# Patient Record
Sex: Female | Born: 1940
Health system: Southern US, Community
[De-identification: ages and names within clinical notes are randomized; demographics above are authoritative.]

## PROBLEM LIST (undated history)

## (undated) DIAGNOSIS — Z9989 Dependence on other enabling machines and devices: Secondary | ICD-10-CM

## (undated) DIAGNOSIS — J45909 Unspecified asthma, uncomplicated: Secondary | ICD-10-CM

## (undated) DIAGNOSIS — Z9289 Personal history of other medical treatment: Secondary | ICD-10-CM

## (undated) DIAGNOSIS — I10 Essential (primary) hypertension: Secondary | ICD-10-CM

## (undated) DIAGNOSIS — I2510 Atherosclerotic heart disease of native coronary artery without angina pectoris: Secondary | ICD-10-CM

## (undated) DIAGNOSIS — U071 COVID-19: Secondary | ICD-10-CM

## (undated) DIAGNOSIS — K219 Gastro-esophageal reflux disease without esophagitis: Secondary | ICD-10-CM

## (undated) DIAGNOSIS — I7300 Raynaud's syndrome without gangrene: Secondary | ICD-10-CM

## (undated) DIAGNOSIS — IMO0002 Reserved for concepts with insufficient information to code with codable children: Secondary | ICD-10-CM

## (undated) DIAGNOSIS — D649 Anemia, unspecified: Secondary | ICD-10-CM

## (undated) DIAGNOSIS — M341 CR(E)ST syndrome: Secondary | ICD-10-CM

## (undated) DIAGNOSIS — G4733 Obstructive sleep apnea (adult) (pediatric): Secondary | ICD-10-CM

## (undated) DIAGNOSIS — I951 Orthostatic hypotension: Secondary | ICD-10-CM

## (undated) DIAGNOSIS — N816 Rectocele: Secondary | ICD-10-CM

## (undated) DIAGNOSIS — I255 Ischemic cardiomyopathy: Secondary | ICD-10-CM

## (undated) DIAGNOSIS — K559 Vascular disorder of intestine, unspecified: Secondary | ICD-10-CM

## (undated) DIAGNOSIS — E785 Hyperlipidemia, unspecified: Secondary | ICD-10-CM

## (undated) DIAGNOSIS — M1990 Unspecified osteoarthritis, unspecified site: Secondary | ICD-10-CM

## (undated) DIAGNOSIS — K2270 Barrett's esophagus without dysplasia: Secondary | ICD-10-CM

## (undated) DIAGNOSIS — M810 Age-related osteoporosis without current pathological fracture: Secondary | ICD-10-CM

## (undated) DIAGNOSIS — K5792 Diverticulitis of intestine, part unspecified, without perforation or abscess without bleeding: Secondary | ICD-10-CM

## (undated) HISTORY — DX: Orthostatic hypotension: I95.1

## (undated) HISTORY — DX: Diverticulitis of intestine, part unspecified, without perforation or abscess without bleeding: K57.92

## (undated) HISTORY — DX: Personal history of other medical treatment: Z92.89

## (undated) HISTORY — DX: Age-related osteoporosis without current pathological fracture: M81.0

## (undated) HISTORY — DX: Unspecified asthma, uncomplicated: J45.909

## (undated) HISTORY — DX: Anemia, unspecified: D64.9

## (undated) HISTORY — DX: Reserved for concepts with insufficient information to code with codable children: IMO0002

## (undated) HISTORY — DX: Essential (primary) hypertension: I10

## (undated) HISTORY — DX: Vascular disorder of intestine, unspecified: K55.9

## (undated) HISTORY — DX: COVID-19: U07.1

## (undated) HISTORY — DX: Ischemic cardiomyopathy: I25.5

## (undated) HISTORY — DX: Gastro-esophageal reflux disease without esophagitis: K21.9

## (undated) HISTORY — DX: Rectocele: N81.6

## (undated) HISTORY — DX: Barrett's esophagus without dysplasia: K22.70

## (undated) HISTORY — PX: TOTAL KNEE ARTHROPLASTY: SHX125

## (undated) HISTORY — PX: ROTATOR CUFF REPAIR: SHX139

---
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
## 1978-11-25 HISTORY — PX: TUBAL LIGATION: SHX77

## 1994-11-25 HISTORY — PX: ABDOMINAL HYSTERECTOMY: SHX81

## 2001-01-15 ENCOUNTER — Encounter: Payer: Self-pay | Admitting: Emergency Medicine

## 2001-01-15 ENCOUNTER — Emergency Department (HOSPITAL_COMMUNITY): Admission: EM | Admit: 2001-01-15 | Discharge: 2001-01-15 | Payer: Self-pay | Admitting: Emergency Medicine

## 2004-11-25 HISTORY — PX: OTHER SURGICAL HISTORY: SHX169

## 2006-08-08 ENCOUNTER — Other Ambulatory Visit: Admission: RE | Admit: 2006-08-08 | Discharge: 2006-08-08 | Payer: Self-pay | Admitting: Gynecology

## 2007-09-03 ENCOUNTER — Emergency Department (HOSPITAL_COMMUNITY): Admission: EM | Admit: 2007-09-03 | Discharge: 2007-09-04 | Payer: Self-pay | Admitting: Emergency Medicine

## 2008-08-01 ENCOUNTER — Emergency Department (HOSPITAL_BASED_OUTPATIENT_CLINIC_OR_DEPARTMENT_OTHER): Admission: EM | Admit: 2008-08-01 | Discharge: 2008-08-01 | Payer: Self-pay | Admitting: Emergency Medicine

## 2008-12-23 ENCOUNTER — Encounter: Admission: RE | Admit: 2008-12-23 | Discharge: 2008-12-23 | Payer: Self-pay | Admitting: Obstetrics & Gynecology

## 2009-04-17 ENCOUNTER — Encounter: Admission: RE | Admit: 2009-04-17 | Discharge: 2009-04-17 | Payer: Self-pay | Admitting: Family Medicine

## 2009-07-26 HISTORY — PX: JOINT REPLACEMENT: SHX530

## 2009-08-11 ENCOUNTER — Inpatient Hospital Stay (HOSPITAL_COMMUNITY): Admission: RE | Admit: 2009-08-11 | Discharge: 2009-08-14 | Payer: Self-pay | Admitting: Orthopedic Surgery

## 2009-10-26 ENCOUNTER — Ambulatory Visit: Payer: Self-pay | Admitting: Diagnostic Radiology

## 2009-10-26 ENCOUNTER — Emergency Department (HOSPITAL_BASED_OUTPATIENT_CLINIC_OR_DEPARTMENT_OTHER): Admission: EM | Admit: 2009-10-26 | Discharge: 2009-10-26 | Payer: Self-pay | Admitting: Emergency Medicine

## 2010-09-06 ENCOUNTER — Other Ambulatory Visit: Admission: RE | Admit: 2010-09-06 | Discharge: 2010-09-06 | Payer: Self-pay | Admitting: Gynecology

## 2010-09-06 ENCOUNTER — Ambulatory Visit: Payer: Self-pay | Admitting: Gynecology

## 2011-01-09 ENCOUNTER — Other Ambulatory Visit: Payer: Self-pay | Admitting: Family Medicine

## 2011-01-09 DIAGNOSIS — R42 Dizziness and giddiness: Secondary | ICD-10-CM

## 2011-01-11 ENCOUNTER — Ambulatory Visit
Admission: RE | Admit: 2011-01-11 | Discharge: 2011-01-11 | Disposition: A | Discharge status: Home / Self Care | Payer: PRIVATE HEALTH INSURANCE | Source: Ambulatory Visit | Attending: Family Medicine | Admitting: Family Medicine

## 2011-01-11 DIAGNOSIS — R42 Dizziness and giddiness: Secondary | ICD-10-CM

## 2011-02-26 LAB — CBC
HCT: 33.9 % — ABNORMAL LOW (ref 36.0–46.0)
MCHC: 33.1 g/dL (ref 30.0–36.0)
MCV: 80.4 fL (ref 78.0–100.0)
Platelets: 192 10*3/uL (ref 150–400)
RDW: 17.8 % — ABNORMAL HIGH (ref 11.5–15.5)
WBC: 7.1 10*3/uL (ref 4.0–10.5)

## 2011-02-26 LAB — DIFFERENTIAL
Basophils Relative: 1 % (ref 0–1)
Eosinophils Absolute: 0.1 10*3/uL (ref 0.0–0.7)
Eosinophils Relative: 1 % (ref 0–5)
Lymphs Abs: 1.1 10*3/uL (ref 0.7–4.0)
Neutrophils Relative %: 74 % (ref 43–77)

## 2011-02-26 LAB — BASIC METABOLIC PANEL
BUN: 13 mg/dL (ref 6–23)
CO2: 29 mEq/L (ref 19–32)
Chloride: 102 mEq/L (ref 96–112)
Creatinine, Ser: 0.8 mg/dL (ref 0.4–1.2)
Glucose, Bld: 119 mg/dL — ABNORMAL HIGH (ref 70–99)
Potassium: 4.5 mEq/L (ref 3.5–5.1)

## 2011-02-26 LAB — HEMOCCULT GUIAC POC 1CARD (OFFICE): Fecal Occult Bld: NEGATIVE

## 2011-03-01 LAB — PROTIME-INR
INR: 1 (ref 0.00–1.49)
INR: 1.2 (ref 0.00–1.49)
INR: 1.6 — ABNORMAL HIGH (ref 0.00–1.49)
Prothrombin Time: 12.9 seconds (ref 11.6–15.2)
Prothrombin Time: 19.2 seconds — ABNORMAL HIGH (ref 11.6–15.2)
Prothrombin Time: 11.8 seconds (ref 11.6–15.2)

## 2011-03-01 LAB — URINALYSIS, ROUTINE W REFLEX MICROSCOPIC
Glucose, UA: NEGATIVE mg/dL
Protein, ur: NEGATIVE mg/dL
Urobilinogen, UA: 0.2 mg/dL (ref 0.0–1.0)

## 2011-03-01 LAB — CBC
MCHC: 33.9 g/dL (ref 30.0–36.0)
MCHC: 34 g/dL (ref 30.0–36.0)
MCV: 87.9 fL (ref 78.0–100.0)
Platelets: 162 10*3/uL (ref 150–400)
Platelets: 166 10*3/uL (ref 150–400)
Platelets: 200 10*3/uL (ref 150–400)
RBC: 4.57 MIL/uL (ref 3.87–5.11)
RDW: 14.1 % (ref 11.5–15.5)
WBC: 10.2 10*3/uL (ref 4.0–10.5)
WBC: 10.9 10*3/uL — ABNORMAL HIGH (ref 4.0–10.5)
WBC: 11 10*3/uL — ABNORMAL HIGH (ref 4.0–10.5)
WBC: 7.1 10*3/uL (ref 4.0–10.5)

## 2011-03-01 LAB — TYPE AND SCREEN: Antibody Screen: NEGATIVE

## 2011-03-01 LAB — BASIC METABOLIC PANEL
BUN: 3 mg/dL — ABNORMAL LOW (ref 6–23)
BUN: 7 mg/dL (ref 6–23)
Calcium: 8.6 mg/dL (ref 8.4–10.5)
Calcium: 8.9 mg/dL (ref 8.4–10.5)
Chloride: 101 mEq/L (ref 96–112)
Creatinine, Ser: 0.71 mg/dL (ref 0.4–1.2)
GFR calc Af Amer: >60 mL/min (ref >60)
GFR calc non Af Amer: >60 mL/min (ref >60)
Glucose, Bld: 131 mg/dL — ABNORMAL HIGH (ref 70–99)
Sodium: 137 mEq/L (ref 135–145)

## 2011-03-01 LAB — DIFFERENTIAL
Eosinophils Absolute: 0.2 10*3/uL (ref 0.0–0.7)
Eosinophils Relative: 3 % (ref 0–5)
Lymphs Abs: 1.8 10*3/uL (ref 0.7–4.0)

## 2011-03-01 LAB — COMPREHENSIVE METABOLIC PANEL
ALT: 21 U/L (ref 0–35)
AST: 19 U/L (ref 0–37)
CO2: 25 mEq/L (ref 19–32)
Calcium: 9.6 mg/dL (ref 8.4–10.5)
Chloride: 102 mEq/L (ref 96–112)
GFR calc Af Amer: >60 mL/min (ref >60)
GFR calc non Af Amer: >60 mL/min (ref >60)
Potassium: 4.7 mEq/L (ref 3.5–5.1)
Sodium: 136 mEq/L (ref 135–145)

## 2011-03-01 LAB — URINE MICROSCOPIC-ADD ON

## 2011-08-28 LAB — GLUCOSE, CAPILLARY: Glucose-Capillary: 102 — ABNORMAL HIGH

## 2011-08-28 LAB — COMPREHENSIVE METABOLIC PANEL
ALT: 46 — ABNORMAL HIGH
AST: 31
CO2: 25
Calcium: 9.8
Chloride: 109
GFR calc Af Amer: >60
GFR calc non Af Amer: >60
Sodium: 140

## 2011-08-28 LAB — CBC
RBC: 4.6
WBC: 6.1

## 2011-08-28 LAB — DIFFERENTIAL
Eosinophils Absolute: 0.2
Eosinophils Relative: 3
Lymphs Abs: 1.5

## 2011-09-03 DIAGNOSIS — N816 Rectocele: Secondary | ICD-10-CM | POA: Insufficient documentation

## 2011-09-03 DIAGNOSIS — K219 Gastro-esophageal reflux disease without esophagitis: Secondary | ICD-10-CM | POA: Insufficient documentation

## 2011-09-03 DIAGNOSIS — IMO0002 Reserved for concepts with insufficient information to code with codable children: Secondary | ICD-10-CM | POA: Insufficient documentation

## 2011-09-03 DIAGNOSIS — K2270 Barrett's esophagus without dysplasia: Secondary | ICD-10-CM | POA: Insufficient documentation

## 2011-09-05 LAB — CBC
MCHC: 33.6
MCV: 82.7
Platelets: 169
WBC: 10.4

## 2011-09-05 LAB — DIFFERENTIAL
Basophils Absolute: 0.1
Basophils Relative: 1
Eosinophils Absolute: 0.2
Lymphs Abs: 2
Neutrophils Relative %: 67

## 2011-09-05 LAB — D-DIMER, QUANTITATIVE: D-Dimer, Quant: 0.41

## 2011-09-05 LAB — POCT CARDIAC MARKERS: Operator id: 1192

## 2011-09-09 ENCOUNTER — Other Ambulatory Visit (HOSPITAL_COMMUNITY)
Admission: RE | Admit: 2011-09-09 | Discharge: 2011-09-09 | Disposition: A | Discharge status: Home / Self Care | Payer: PRIVATE HEALTH INSURANCE | Source: Ambulatory Visit | Attending: Gynecology | Admitting: Gynecology

## 2011-09-09 ENCOUNTER — Encounter: Payer: Self-pay | Admitting: Gynecology

## 2011-09-09 ENCOUNTER — Ambulatory Visit (INDEPENDENT_AMBULATORY_CARE_PROVIDER_SITE_OTHER): Payer: PRIVATE HEALTH INSURANCE | Admitting: Gynecology

## 2011-09-09 VITALS — BP 128/74 | Ht 61.0 in | Wt 137.0 lb

## 2011-09-09 DIAGNOSIS — R82998 Other abnormal findings in urine: Secondary | ICD-10-CM

## 2011-09-09 DIAGNOSIS — Z01419 Encounter for gynecological examination (general) (routine) without abnormal findings: Secondary | ICD-10-CM

## 2011-09-09 DIAGNOSIS — B373 Candidiasis of vulva and vagina: Secondary | ICD-10-CM

## 2011-09-09 DIAGNOSIS — K469 Unspecified abdominal hernia without obstruction or gangrene: Secondary | ICD-10-CM

## 2011-09-09 DIAGNOSIS — B3731 Acute candidiasis of vulva and vagina: Secondary | ICD-10-CM

## 2011-09-09 DIAGNOSIS — K5792 Diverticulitis of intestine, part unspecified, without perforation or abscess without bleeding: Secondary | ICD-10-CM | POA: Insufficient documentation

## 2011-09-09 DIAGNOSIS — N952 Postmenopausal atrophic vaginitis: Secondary | ICD-10-CM

## 2011-09-09 DIAGNOSIS — N8111 Cystocele, midline: Secondary | ICD-10-CM

## 2011-09-09 MED ORDER — FLUCONAZOLE 150 MG PO TABS: 150.0000 mg | ORAL_TABLET | Freq: Once | ORAL | Status: AC

## 2011-09-09 NOTE — Progress Notes (Signed)
Addended by: Landis Martins R on: 09/09/2011 09:43 AM   Modules accepted: Orders

## 2011-09-09 NOTE — Progress Notes (Signed)
Kristine [name] 06-13-1941 [id number]        70 y.o.  for follow up. History of cystocele enterocele and occasional spotting which we think is due to irritative changes. She's not having any symptoms from a bladder standpoint or from her prolapse.  Past medical history,surgical history, medications, allergies, family history and social history were all reviewed and documented in the EPIC chart. ROS:  Was performed and pertinent positives and negatives are included in the history.  Exam: chaperone present Filed Vitals:   09/09/11 0905  BP: 128/74   General appearance  Normal Skin grossly normal Head/Neck normal with no cervical or supraclavicular adenopathy thyroid normal Lungs  clear Cardiac RR, without RMG Abdominal  soft, nontender, without masses, organomegaly or hernia Breasts  examined lying and sitting without masses, retractions, discharge or axillary adenopathy. Pelvic  Ext/BUS/vagina  normal with atrophic changes mild cystocele with second degree enterocele no significant rectocele, slight discharge wet prep done  Adnexa  Without masses or tenderness    Anus and perineum  normal   Rectovaginal  normal sphincter tone without palpated masses or tenderness.    Assessment/Plan:  70 y.o. female  1. Cystocele enterocele. Patient is asymptomatic with occasional spotting. No bleeding today with atrophic changes noted. Wet prep is positive for yeast which I think contributes to her spotting will treat her with Diflucan 150x1 dose. Options for management to include pessary, surgery, vaginal estrogen such as Vagifem was reviewed she's not interested in doing this as she is asymptomatic and would prefer just to monitor. 2. Health maintenance. Self breast exams on a monthly basis discussed urged. She is due for mammography in December and knows to schedule this. Is due for bone density and we will schedule that now, she agrees to do so. She's up-to-date with colonoscopy. No blood work was done as  this is all done through her primary's office.  Assuming she continues well she will see me in a year, sooner as needed    Dara Lords MD, 9:27 AM 09/09/2011

## 2011-11-14 ENCOUNTER — Encounter (HOSPITAL_BASED_OUTPATIENT_CLINIC_OR_DEPARTMENT_OTHER): Payer: Self-pay | Admitting: *Deleted

## 2011-11-14 NOTE — Progress Notes (Signed)
To come in for ekg and bmet Arrive with daughter [employer]

## 2011-11-20 ENCOUNTER — Other Ambulatory Visit: Payer: Self-pay

## 2011-11-20 ENCOUNTER — Encounter (HOSPITAL_BASED_OUTPATIENT_CLINIC_OR_DEPARTMENT_OTHER)
Admission: RE | Admit: 2011-11-20 | Discharge: 2011-11-20 | Disposition: A | Discharge status: Home / Self Care | Payer: PRIVATE HEALTH INSURANCE | Source: Ambulatory Visit | Attending: Orthopedic Surgery | Admitting: Orthopedic Surgery

## 2011-11-20 LAB — BASIC METABOLIC PANEL
BUN: 17 mg/dL (ref 6–23)
CO2: 27 mEq/L (ref 19–32)
Calcium: 9.4 mg/dL (ref 8.4–10.5)
Creatinine, Ser: 0.78 mg/dL (ref 0.50–1.10)
Glucose, Bld: 96 mg/dL (ref 70–99)

## 2011-11-21 ENCOUNTER — Encounter (HOSPITAL_BASED_OUTPATIENT_CLINIC_OR_DEPARTMENT_OTHER): Payer: Self-pay | Admitting: *Deleted

## 2011-11-21 ENCOUNTER — Encounter (HOSPITAL_BASED_OUTPATIENT_CLINIC_OR_DEPARTMENT_OTHER): Payer: Self-pay | Admitting: Anesthesiology

## 2011-11-21 ENCOUNTER — Ambulatory Visit (HOSPITAL_BASED_OUTPATIENT_CLINIC_OR_DEPARTMENT_OTHER): Payer: PRIVATE HEALTH INSURANCE | Admitting: Anesthesiology

## 2011-11-21 ENCOUNTER — Ambulatory Visit (HOSPITAL_BASED_OUTPATIENT_CLINIC_OR_DEPARTMENT_OTHER)
Admission: RE | Admit: 2011-11-21 | Discharge: 2011-11-21 | Disposition: A | Discharge status: Home / Self Care | Payer: PRIVATE HEALTH INSURANCE | Source: Ambulatory Visit | Attending: Orthopedic Surgery | Admitting: Orthopedic Surgery

## 2011-11-21 ENCOUNTER — Encounter (HOSPITAL_BASED_OUTPATIENT_CLINIC_OR_DEPARTMENT_OTHER)
Admission: RE | Disposition: A | Discharge status: Home / Self Care | Payer: Self-pay | Source: Ambulatory Visit | Attending: Orthopedic Surgery

## 2011-11-21 DIAGNOSIS — Z01812 Encounter for preprocedural laboratory examination: Secondary | ICD-10-CM | POA: Insufficient documentation

## 2011-11-21 DIAGNOSIS — K219 Gastro-esophageal reflux disease without esophagitis: Secondary | ICD-10-CM | POA: Insufficient documentation

## 2011-11-21 DIAGNOSIS — I10 Essential (primary) hypertension: Secondary | ICD-10-CM | POA: Insufficient documentation

## 2011-11-21 DIAGNOSIS — Z01810 Encounter for preprocedural cardiovascular examination: Secondary | ICD-10-CM | POA: Insufficient documentation

## 2011-11-21 DIAGNOSIS — M2030 Hallux varus (acquired), unspecified foot: Secondary | ICD-10-CM | POA: Insufficient documentation

## 2011-11-21 DIAGNOSIS — M2010 Hallux valgus (acquired), unspecified foot: Secondary | ICD-10-CM | POA: Insufficient documentation

## 2011-11-21 HISTORY — DX: Unspecified osteoarthritis, unspecified site: M19.90

## 2011-11-21 HISTORY — DX: Raynaud's syndrome without gangrene: I73.00

## 2011-11-21 SURGERY — FUSION, PIP JOINT
Anesthesia: General | Site: Foot | Laterality: Right | Wound class: Clean

## 2011-11-21 MED ORDER — LIDOCAINE-EPINEPHRINE 1.5-1:200000 % IJ SOLN
INTRAMUSCULAR | Status: DC | PRN
  Administered 2011-11-21: 25 mL via INTRADERMAL

## 2011-11-21 MED ORDER — LACTATED RINGERS IV SOLN
INTRAVENOUS | Status: DC
  Administered 2011-11-21 (×2): via INTRAVENOUS

## 2011-11-21 MED ORDER — ROPIVACAINE HCL 5 MG/ML IJ SOLN
INTRAMUSCULAR | Status: DC | PRN
  Administered 2011-11-21: 25 mL via EPIDURAL

## 2011-11-21 MED ORDER — PROPOFOL 10 MG/ML IV EMUL
INTRAVENOUS | Status: DC | PRN
  Administered 2011-11-21: 200 mg via INTRAVENOUS

## 2011-11-21 MED ORDER — DEXAMETHASONE SODIUM PHOSPHATE 4 MG/ML IJ SOLN
INTRAMUSCULAR | Status: DC | PRN
  Administered 2011-11-21: 10 mg via INTRAVENOUS

## 2011-11-21 MED ORDER — LIDOCAINE HCL 1 % IJ SOLN
INTRAMUSCULAR | Status: DC | PRN
  Administered 2011-11-21: 2 mL via INTRADERMAL

## 2011-11-21 MED ORDER — EPHEDRINE SULFATE 50 MG/ML IJ SOLN
INTRAMUSCULAR | Status: DC | PRN
  Administered 2011-11-21 (×2): 5 mg via INTRAVENOUS

## 2011-11-21 MED ORDER — METOCLOPRAMIDE HCL 5 MG/ML IJ SOLN: 10.0000 mg | Freq: Once | INTRAMUSCULAR | Status: DC | PRN

## 2011-11-21 MED ORDER — MORPHINE SULFATE 2 MG/ML IJ SOLN: 0.0500 mg/kg | INTRAMUSCULAR | Status: DC | PRN

## 2011-11-21 MED ORDER — FENTANYL CITRATE 0.05 MG/ML IJ SOLN
50.0000 ug | INTRAMUSCULAR | Status: DC | PRN
  Administered 2011-11-21: 100 ug via INTRAVENOUS

## 2011-11-21 MED ORDER — ONDANSETRON HCL 4 MG/2ML IJ SOLN
INTRAMUSCULAR | Status: DC | PRN
  Administered 2011-11-21: 4 mg via INTRAVENOUS

## 2011-11-21 MED ORDER — OXYCODONE HCL 5 MG PO TABS: 5.0000 mg | ORAL_TABLET | ORAL | Status: AC | PRN

## 2011-11-21 MED ORDER — MIDAZOLAM HCL 2 MG/2ML IJ SOLN
0.5000 mg | INTRAMUSCULAR | Status: DC | PRN
  Administered 2011-11-21: 2 mg via INTRAVENOUS

## 2011-11-21 MED ORDER — CEFAZOLIN SODIUM-DEXTROSE 2-3 GM-% IV SOLR
2.0000 g | INTRAVENOUS | Status: AC
  Administered 2011-11-21: 2 g via INTRAVENOUS

## 2011-11-21 MED ORDER — SODIUM CHLORIDE 0.9 % IV SOLN: INTRAVENOUS | Status: DC

## 2011-11-21 MED ORDER — LIDOCAINE HCL (CARDIAC) 20 MG/ML IV SOLN
INTRAVENOUS | Status: DC | PRN
  Administered 2011-11-21: 40 mg via INTRAVENOUS

## 2011-11-21 MED ORDER — FENTANYL CITRATE 0.05 MG/ML IJ SOLN: 25.0000 ug | INTRAMUSCULAR | Status: DC | PRN

## 2011-11-21 SURGICAL SUPPLY — 77 items
BAG DECANTER FOR FLEXI CONT (MISCELLANEOUS) ×1 IMPLANT
BANDAGE COBAN STERILE 4 (GAUZE/BANDAGES/DRESSINGS) ×1 IMPLANT
BANDAGE CONFORM 2  STR LF (GAUZE/BANDAGES/DRESSINGS) IMPLANT
BANDAGE ESMARK 6X9 LF (GAUZE/BANDAGES/DRESSINGS) IMPLANT
BANDAGE GAUZE ELAST BULKY 4 IN (GAUZE/BANDAGES/DRESSINGS) IMPLANT
BLADE AVERAGE 25X9 (BLADE) ×1 IMPLANT
BLADE OSC/SAG .038X5.5 CUT EDG (BLADE) IMPLANT
BLADE SURG 15 STRL LF DISP TIS (BLADE) ×2 IMPLANT
BLADE SURG 15 STRL SS (BLADE) ×4
BNDG CMPR 9X4 STRL LF SNTH (GAUZE/BANDAGES/DRESSINGS)
BNDG CMPR 9X6 STRL LF SNTH (GAUZE/BANDAGES/DRESSINGS)
BNDG COHESIVE 4X5 TAN STRL (GAUZE/BANDAGES/DRESSINGS) ×2 IMPLANT
BNDG COHESIVE 6X5 TAN STRL LF (GAUZE/BANDAGES/DRESSINGS) ×2 IMPLANT
BNDG ESMARK 4X9 LF (GAUZE/BANDAGES/DRESSINGS) IMPLANT
BNDG ESMARK 6X9 LF (GAUZE/BANDAGES/DRESSINGS)
CHLORAPREP W/TINT 26ML (MISCELLANEOUS) ×2 IMPLANT
CLOTH BEACON ORANGE TIMEOUT ST (SAFETY) ×2 IMPLANT
COVER MAYO STAND STRL (DRAPES) IMPLANT
COVER TABLE BACK 60X90 (DRAPES) ×2 IMPLANT
CUFF TOURNIQUET SINGLE 18IN (TOURNIQUET CUFF) IMPLANT
CUFF TOURNIQUET SINGLE 34IN LL (TOURNIQUET CUFF) IMPLANT
DECANTER SPIKE VIAL GLASS SM (MISCELLANEOUS) IMPLANT
DRAPE EXTREMITY T 121X128X90 (DRAPE) ×2 IMPLANT
DRAPE INCISE IOBAN 66X45 STRL (DRAPES) IMPLANT
DRAPE OEC MINIVIEW 54X84 (DRAPES) ×2 IMPLANT
DRAPE SURG 17X23 STRL (DRAPES) IMPLANT
DRAPE U-SHAPE 47X51 STRL (DRAPES) ×2 IMPLANT
DRAPE U-SHAPE 76X120 STRL (DRAPES) IMPLANT
DRSG EMULSION OIL 3X3 NADH (GAUZE/BANDAGES/DRESSINGS) ×2 IMPLANT
DRSG PAD ABDOMINAL 8X10 ST (GAUZE/BANDAGES/DRESSINGS) ×4 IMPLANT
DRSG TEGADERM 2-3/8X2-3/4 SM (GAUZE/BANDAGES/DRESSINGS) IMPLANT
ELECT REM PT RETURN 9FT ADLT (ELECTROSURGICAL) ×2
ELECTRODE REM PT RTRN 9FT ADLT (ELECTROSURGICAL) ×1 IMPLANT
GAUZE SPONGE 4X4 12PLY STRL LF (GAUZE/BANDAGES/DRESSINGS) ×1 IMPLANT
GLOVE BIO SURGEON STRL SZ8 (GLOVE) ×2 IMPLANT
GLOVE BIOGEL M STRL SZ7.5 (GLOVE) ×1 IMPLANT
GLOVE BIOGEL PI IND STRL 8 (GLOVE) ×1 IMPLANT
GLOVE BIOGEL PI INDICATOR 8 (GLOVE) ×1
GOWN PREVENTION PLUS XLARGE (GOWN DISPOSABLE) ×2 IMPLANT
GOWN PREVENTION PLUS XXLARGE (GOWN DISPOSABLE) ×3 IMPLANT
K WIRE, AR-8933K ×1 IMPLANT
NDL SAFETY ECLIPSE 18X1.5 (NEEDLE) IMPLANT
NEEDLE HYPO 18GX1.5 SHARP (NEEDLE)
NEEDLE HYPO 22GX1.5 SAFETY (NEEDLE) IMPLANT
PACK BASIN DAY SURGERY FS (CUSTOM PROCEDURE TRAY) ×2 IMPLANT
PAD CAST 4YDX4 CTTN HI CHSV (CAST SUPPLIES) ×1 IMPLANT
PADDING CAST ABS 4INX4YD NS (CAST SUPPLIES) ×1
PADDING CAST ABS COTTON 4X4 ST (CAST SUPPLIES) IMPLANT
PADDING CAST COTTON 4X4 STRL (CAST SUPPLIES) ×2
PADDING CAST COTTON 6X4 STRL (CAST SUPPLIES) ×2 IMPLANT
PADDING WEBRIL 4 STERILE (GAUZE/BANDAGES/DRESSINGS) ×1 IMPLANT
PASSER SUT SWANSON 36MM LOOP (INSTRUMENTS) ×1 IMPLANT
PENCIL BUTTON HOLSTER BLD 10FT (ELECTRODE) ×2 IMPLANT
SCREW LP CANN 3X30MM (Screw) ×1 IMPLANT
SHEET MEDIUM DRAPE 40X70 STRL (DRAPES) ×2 IMPLANT
SPLINT FAST PLASTER 5X30 (CAST SUPPLIES)
SPLINT PLASTER CAST FAST 5X30 (CAST SUPPLIES) IMPLANT
SPONGE LAP 18X18 X RAY DECT (DISPOSABLE) ×3 IMPLANT
STAPLER VISISTAT 35W (STAPLE) IMPLANT
STOCKINETTE 6  STRL (DRAPES) ×1
STOCKINETTE 6 STRL (DRAPES) ×1 IMPLANT
STRIP CLOSURE SKIN 1/2X4 (GAUZE/BANDAGES/DRESSINGS) IMPLANT
SUCTION FRAZIER TIP 10 FR DISP (SUCTIONS) IMPLANT
SUT ETHILON 3 0 PS 1 (SUTURE) IMPLANT
SUT MNCRL AB 3-0 PS2 18 (SUTURE) ×2 IMPLANT
SUT PROLENE 3 0 PS 2 (SUTURE) ×3 IMPLANT
SUT VIC AB 0 SH 27 (SUTURE) ×2 IMPLANT
SUT VIC AB 2-0 SH 18 (SUTURE) IMPLANT
SUT VIC AB 2-0 SH 27 (SUTURE) ×2
SUT VIC AB 2-0 SH 27XBRD (SUTURE) IMPLANT
SUT VICRYL 4-0 PS2 18IN ABS (SUTURE) IMPLANT
SYR BULB 3OZ (MISCELLANEOUS) ×2 IMPLANT
TIGHTROPE REPAIR MINI (Orthopedic Implant) ×2 IMPLANT
TIGHTROPE REPAIR MINI .45 (Orthopedic Implant) IMPLANT
TUBE CONNECTING 20X1/4 (TUBING) IMPLANT
UNDERPAD 30X30 INCONTINENT (UNDERPADS AND DIAPERS) ×2 IMPLANT
WATER STERILE IRR 1000ML POUR (IV SOLUTION) ×1 IMPLANT

## 2011-11-21 NOTE — H&P (Signed)
Kristine [name] is an 70 y.o. female.   Chief Complaint: right hallux deformity HPI: 70 y/o female with claw hallux and hallux varus on right foot.  She desires correction after failing treatment with shoewear and activity modification.  Past Medical History  Diagnosis Date  . GERD (gastroesophageal reflux disease)   . Barrett's esophagus     Stage B  . Cystocele   . Rectocele     mild  . Hypertension   . Diverticulitis   . Arthritis   . Raynaud disease     Past Surgical History  Procedure Date  . Tonsillectomy 1950  . Tubal ligation 1980  . Nissen fundoplication 2006  . Abdominal hysterectomy 1996    RSO  . Total knee arthroplasty     right  . Joint replacement 9/10    rt total    Family History  Problem Relation Age of Onset  . Cancer Mother     Pancreatic Cancer  . Heart disease Father    Social History:  reports that she has never smoked. She has never used smokeless tobacco. She reports that she does not drink alcohol or use illicit drugs.  Allergies:  Allergies  Allergen Reactions  . Nsaids     GERD hx    Medications Prior to Admission  Medication Dose Route Frequency Provider Last Rate Last Dose  . 0.9 %  sodium chloride infusion   Intravenous Continuous Toni Arthurs, MD      . ceFAZolin (ANCEF) IVPB 2 g/50 mL premix  2 g Intravenous 60 min Pre-Op Toni Arthurs, MD      . fentaNYL (SUBLIMAZE) injection 50-100 mcg  50-100 mcg Intravenous PRN Constance Goltz, MD   100 mcg at 11/21/11 0654  . lactated ringers infusion   Intravenous Continuous Raiford Simmonds, MD 10 mL/hr at 11/21/11 [phone]    . midazolam (VERSED) injection 0.5-2 mg  0.5-2 mg Intravenous PRN Constance Goltz, MD   2 mg at 11/21/11 0654   Medications Prior to Admission  Medication Sig Dispense Refill  . lisinopril (PRINIVIL,ZESTRIL) 10 MG tablet Take 10 mg by mouth daily.        . MULTIPLE VITAMINS PO Take by mouth.        Marland Kitchen omeprazole (PRILOSEC) 20 MG capsule Take 20 mg by mouth 2  (two) times daily.        Marland Kitchen OVER THE COUNTER MEDICATION Greens ( equiv.- 8 servings fruit and vegs)      . fluconazole (DIFLUCAN) 150 MG tablet Take 1 tablet (150 mg total) by mouth once.  1 tablet  0    Results for orders placed during the hospital encounter of 11/21/11 (from the past 48 hour(s))  BASIC METABOLIC PANEL     Status: Abnormal   Collection Time   11/20/11  9:30 AM      Component Value Range Comment   Sodium 140  135 - 145 (mEq/L)    Potassium 4.6  3.5 - 5.1 (mEq/L)    Chloride 106  96 - 112 (mEq/L)    CO2 27  19 - 32 (mEq/L)    Glucose, Bld 96  70 - 99 (mg/dL)    BUN 17  6 - 23 (mg/dL)    Creatinine, Ser 9.60  0.50 - 1.10 (mg/dL)    Calcium 9.4  8.4 - 10.5 (mg/dL)    GFR calc non Af Amer 84 (*) >90 (mL/min)    GFR calc Af Amer >90  >90 (mL/min)  ROS  No recent f/c/n/v/wt loss.  Blood pressure 114/53, pulse 68, temperature 98.3 F (36.8 C), temperature source Oral, resp. rate 12, height 5\' 1"  (1.549 m), weight 62.596 kg (138 lb), last menstrual period 11/25/1994, SpO2 100.00%. Physical Exam wn wd female in nad.  R foot with claw hallux and hallux varus deformities.  Skin healthy and intact.  No lymphadenopathy.  2+ dp and pt pulses.  NL sens to LT throughtou foot.  Assessment/Plan R hallux varus and claw hallux deformity.  To OR for collateral ligament recon and IP joint arthrodesis. The risks and benefits of the alternative treatment options have been discussed in detail.  The patient wishes to proceed with surgery and specifically understands risks of bleeding, infection, nerve damage, blood clots, need for additional surgery, amputation and death.   Toni Arthurs 11-25-11, 7:19 AM

## 2011-11-21 NOTE — Anesthesia Preprocedure Evaluation (Addendum)
Anesthesia Evaluation  Patient identified by MRN, date of birth, ID band Patient awake    Reviewed: Allergy & Precautions, H&P , NPO status , Patient's Chart, lab work & pertinent test results, reviewed documented beta blocker date and time   Airway Mallampati: II TM Distance: >3 FB Neck ROM: full    Dental   Pulmonary neg pulmonary ROS,          Cardiovascular hypertension,     Neuro/Psych Negative Neurological ROS  Negative Psych ROS   GI/Hepatic Neg liver ROS, GERD-  Medicated and Controlled,  Endo/Other  Negative Endocrine ROS  Renal/GU negative Renal ROS  Genitourinary negative   Musculoskeletal   Abdominal   Peds  Hematology negative hematology ROS (+)   Anesthesia Other Findings See surgeon's H&P   Reproductive/Obstetrics negative OB ROS                           Anesthesia Physical Anesthesia Plan  ASA: II  Anesthesia Plan: General   Post-op Pain Management: MAC Combined w/ Regional for Post-op pain   Induction: Intravenous  Airway Management Planned: LMA  Additional Equipment:   Intra-op Plan:   Post-operative Plan: Extubation in OR  Informed Consent: I have reviewed the patients History and Physical, chart, labs and discussed the procedure including the risks, benefits and alternatives for the proposed anesthesia with the patient or authorized representative who has indicated his/her understanding and acceptance.   Dental Advisory Given  Plan Discussed with: CRNA and Surgeon  Anesthesia Plan Comments:        Anesthesia Quick Evaluation

## 2011-11-21 NOTE — Anesthesia Postprocedure Evaluation (Signed)
  Anesthesia Post-op Note  Patient: Kristine [name]  Procedure(s) Performed:  PROXIMAL INTERPHALANGEAL FUSION (PIP) - right hallux MP joint collateral ligament reconstruction, right hallux IP joint arthrodesis anesthesia:  general with a regional block Special Needs: arthrex mini tight rope, arthrex 3.7mm cannulated screws  Patient Location: PACU  Anesthesia Type: GA combined with regional for post-op pain  Level of Consciousness: awake, alert  and oriented  Airway and Oxygen Therapy: Patient Spontanous Breathing  Post-op Pain: mild  Post-op Assessment: Post-op Vital signs reviewed, Patient's Cardiovascular Status Stable, Respiratory Function Stable, Patent Airway, No signs of Nausea or vomiting and Pain level controlled  Post-op Vital Signs: Reviewed and stable  Complications: No apparent anesthesia complications

## 2011-11-21 NOTE — Anesthesia Procedure Notes (Addendum)
Anesthesia Regional Block:  Popliteal block  Pre-Anesthetic Checklist: ,, timeout performed, Correct Patient, Correct Site, Correct Laterality, Correct Procedure, Correct Position, site marked, Risks and benefits discussed,  Surgical consent,  Pre-op evaluation,  At surgeon's request and post-op pain management  Laterality: Right  Prep: chloraprep       Needles:   Needle Type: Other   (Arrow Echogenic)   Needle Length: 9cm  Needle Gauge: 21    Additional Needles:  Procedures: ultrasound guided Popliteal block Narrative:  Start time: 11/21/2011 6:56 AM End time: 11/21/2011 7:03 AM Injection made incrementally with aspirations every 5 mL.  Performed by: Personally  Anesthesiologist: Aldona Lento, MD  Additional Notes: Ultrasound guidance used to: id relevant anatomy, confirm needle position, local anesthetic spread, avoidance of vascular puncture. Picture saved. No complications. Block performed personally by Janetta Hora. Gelene Mink, MD  .    Popliteal block Procedure Name: LMA Insertion Performed by: Bingham Millette, Erling Conte Pre-anesthesia Checklist: Patient identified, Emergency Drugs available, Suction available and Patient being monitored Patient Re-evaluated:Patient Re-evaluated prior to inductionOxygen Delivery Method: Circle System Utilized Preoxygenation: Pre-oxygenation with 100% oxygen Intubation Type: IV induction Ventilation: Mask ventilation without difficulty LMA: LMA inserted LMA Size: 4.0 Number of attempts: 1 Airway Equipment and Method: bite block Placement Confirmation: positive ETCO2 and breath sounds checked- equal and bilateral Tube secured with: Tape Dental Injury: Teeth and Oropharynx as per pre-operative assessment     Date/Time: 11/21/2011 9:34 AM Performed by: Meyer Russel

## 2011-11-21 NOTE — Progress Notes (Signed)
Assisted Dr. Frederick with right, ultrasound guided, popliteal/saphenous block. Side rails up, monitors on throughout procedure. See vital signs in flow sheet. Tolerated Procedure well. 

## 2011-11-21 NOTE — Transfer of Care (Signed)
Immediate Anesthesia Transfer of Care Note  Patient: Kristine [name]  Procedure(s) Performed:  PROXIMAL INTERPHALANGEAL FUSION (PIP) - right hallux MP joint collateral ligament reconstruction, right hallux IP joint arthrodesis anesthesia:  general with a regional block Special Needs: arthrex mini tight rope, arthrex 3.103mm cannulated screws  Patient Location: PACU  Anesthesia Type: General  Level of Consciousness: oriented and sedated  Airway & Oxygen Therapy: Patient Spontanous Breathing and Patient connected to face mask oxygen  Post-op Assessment: Report given to PACU RN and Post -op Vital signs reviewed and stable  Post vital signs: Reviewed and stable  Complications: No apparent anesthesia complications

## 2011-11-21 NOTE — Op Note (Signed)
NAMEMARLEY, PAKULA NO.:  [id number]  MEDICAL RECORD NO.:  [id number]  LOCATION:  CYT                          FACILITY:  [facility]  PHYSICIAN:  Toni Arthurs, MD        DATE OF BIRTH:  [birth year]  DATE OF PROCEDURE:  11/21/2011 DATE OF DISCHARGE:  09/09/2011                              OPERATIVE REPORT   PREOP DIAGNOSES: 1. Right hallux varus deformity. 2. Right hallux valgus interphalangeus.  POSTOP DIAGNOSES: 1. Right hallux varus deformity. 2. Right hallux valgus interphalangeus.  PROCEDURE: 1. Arthrodesis of right hallux IP joint. 2. Reconstruction of right hallux MP joint, lateral collateral     ligament (correction of hallux varus). 3. R hallux MP joint medial capsulotomy 3. Intraoperative interpretation of fluoroscopic imaging.  SURGEON:  Toni Arthurs, MD  ANESTHESIA:  General, regional.  IV FLUIDS:  See anesthesia record.  ESTIMATED BLOOD LOSS:  Minimal.  COMPLICATIONS:  None apparent.  TOURNIQUET TIME:  67 minutes at 250 mmHg.  DISPOSITION:  Extubated, awake, and stable to recovery.  INDICATIONS FOR PROCEDURE:  The patient is a 70 year old woman, who complains of difficulty with shoe wear due to the position of her right foot.  She has a long-standing right hallux varus deformity and a cock- up right hallux with hallux valgus interphalangeus.  She desires surgical correction having failed activity modification, shoe wear modification, and oral anti-inflammatories.  She understands the risks and benefits of the operative treatment, and elects to proceed. Specifically, she understands risks of bleeding, infection, nerve damage, blood clots, need for additional surgery, amputation, and death.  PROCEDURE:  After preoperative consent was obtained, the correct operative site was identified.  The patient was brought to the operating room and placed supine on the operating table.  General anesthesia was induced.  Preoperative antibiotics were  administered.  Surgical time-out was taken.  Right lower extremity was prepped and draped in standard sterile fashion with a tourniquet around the thigh.  The right hallux varus and hallux valgus interphalangeus deformities were identified. The skin was marked with a longitudinal medial incision and a longitudinal dorsal incision in the first web space, dorsally a transverse incision was marked over the IP joint.  The extremity was exsanguinated and tourniquet was inflated to 250 mmHg.  The transverse incision was made first, sharp dissection was carried down through the skin and the extensor tendon.  The IP joint was exposed.  A sagittal saw was then used to resect the head of the proximal phalanx and the base of the middle phalanx resecting more bone medially than laterally.  This allowed correction of the hallux valgus interphalangeus deformity.  The cut surfaces were opposed, a K-wire was driven through the tip of the toe and across into the proximal phalanx.  A 3 mm partially threaded screw was then inserted over the guidewire and noted to have appropriate purchase.  The medial incision was then made and sharp dissection was carried down through the skin.  The medial joint capsule was identified, it was lengthened in Z fashion allowing the reduction of the hallux MP joint to a neutral position.  The dorsal incision was made and blunt dissection  was then carried down through the subcutaneous tissue exposing the lateral joint capsule.  A guidewire for the Arthrex Mini TightRope was then used to drive across the metatarsal neck.  This was over drilled, and the guidewire was then repositioned across the base of the proximal phalanx.  Again, the guidewire was over drilled. Fluoroscopic images were obtained verifying correct position of the guidewires before drilling.  At this point, the guide pin for the Mini TightRope was passed across from the metatarsal neck medially to the metatarsal  head laterally, and then from the base of the proximal phalanx laterally to the shaft of the proximal phalanx medially.  The suture button was pulled out through the shaft of the proximal phalanx medially and toggled such that it was flushed against the cortex of the bone.  The hallux MP joint was then maximally reduced so that the hallux was adjacent to the 2nd toe.  The TightRope was tightened securely against the metaphyseal bone proximally and tied with a stack of knots. AP and lateral views were obtained showing appropriate reduction of the hallux varus deformity and appropriate position and length of all hardware.  All of the sutures were trimmed, the stack of knots was then dunked plantar to the metatarsal shaft proximally and deep to the subcutaneous tissues.  The medial joint capsule was then repaired with 2- 0 Vicryl simple sutures.  The extensor tendon was repaired with 2-0 Vicryl simple sutures.  All the skin incisions were then closed with horizontal mattress or running 3-0 Prolene sutures.  Sterile dressings were applied followed by compression wrap.  The patient was then awake from anesthesia and transported to recovery room in stable condition. The tourniquet had been released at 67 minutes after application of the dressings.  FOLLOWUP PLAN:  The patient will be weightbearing as tolerated on her right foot and hard sole shoe.  She will follow up with me in 2 weeks for suture removal and wound check.     Toni Arthurs, MD     JH/MEDQ  D:  11/21/2011  T:  11/21/2011  Job:  [id number]

## 2011-11-21 NOTE — Brief Op Note (Signed)
11/21/2011  9:09 AM  PATIENT:  Kristine [name]  70 y.o. female  PRE-OPERATIVE DIAGNOSIS:  right hallux varus, right hallux valgas interphalangeus  POST-OPERATIVE DIAGNOSIS:  right hallux varus, right hallux valgas interphalanges  Procedure(s): 1.  Arthrodesis of right hallux IP joint 2.  Reconstruction of right hallux MPJ lateral collateral ligament (correction of hallux varus). 3.  Intra-op fluoro  SURGEON:  Toni Arthurs, MD  ASSISTANT: n/a  ANESTHESIA:   General, regional  EBL:  minimal  TOURNIQUET:   Total Tourniquet Time Documented: Thigh (Right) - 67 minutes  COMPLICATIONS:  None apparent  DISPOSITION:  Extubated, awake and stable to recovery.  DICTATION ID:  [id number]

## 2011-11-26 HISTORY — PX: FOOT SURGERY: SHX648

## 2012-02-03 ENCOUNTER — Encounter: Payer: Self-pay | Admitting: *Deleted

## 2012-02-03 NOTE — Progress Notes (Signed)
Patient ID: Kristine [name], female   DOB: 1941-04-16, 71 y.o.   MRN: [id number] Pt called to find out when last mammogram was done. Pt informed done at [facility] in December 2011

## 2012-02-25 DIAGNOSIS — M2030 Hallux varus (acquired), unspecified foot: Secondary | ICD-10-CM | POA: Insufficient documentation

## 2012-02-27 ENCOUNTER — Ambulatory Visit (INDEPENDENT_AMBULATORY_CARE_PROVIDER_SITE_OTHER): Payer: PRIVATE HEALTH INSURANCE | Admitting: Emergency Medicine

## 2012-02-27 VITALS — BP 139/77 | HR 83 | Temp 98.1°F | Resp 16 | Ht 62.0 in | Wt 136.0 lb

## 2012-02-27 DIAGNOSIS — J029 Acute pharyngitis, unspecified: Secondary | ICD-10-CM

## 2012-02-27 DIAGNOSIS — J069 Acute upper respiratory infection, unspecified: Secondary | ICD-10-CM

## 2012-02-27 NOTE — Patient Instructions (Signed)

## 2012-02-27 NOTE — Progress Notes (Signed)
  Subjective:    Patient ID: Kristine [name], female    DOB: [birth year], 71 y.o.   MRN: [id number]  HPI patient enters with onset [place] her of sore throat she also has had some mild head congestion. She has had a cough which has been minimally productive of a whitish yellow phlegm. She has had no shortness of breath. She's had no fever or chills the    Review of Systems noncontributory except as relates to this illness.     Objective:   Physical Exam  Constitutional: She appears well-developed and well-nourished.  HENT:  Head: Normocephalic.  Right Ear: External ear normal.  Left Ear: External ear normal.  Nose: Nose normal.  Neck: Neck supple.  Cardiovascular: Normal heart sounds.   Pulmonary/Chest: Breath sounds normal. She has no wheezes. She has no rales.          Assessment & Plan:  Patient here with upper respiratory type infection with severe sore throat. We'll check strep test.

## 2012-04-09 ENCOUNTER — Other Ambulatory Visit: Payer: Self-pay | Admitting: Emergency Medicine

## 2012-04-09 DIAGNOSIS — Z9889 Other specified postprocedural states: Secondary | ICD-10-CM | POA: Insufficient documentation

## 2012-07-17 ENCOUNTER — Other Ambulatory Visit: Payer: Self-pay | Admitting: Emergency Medicine

## 2012-07-25 ENCOUNTER — Ambulatory Visit (INDEPENDENT_AMBULATORY_CARE_PROVIDER_SITE_OTHER): Payer: PRIVATE HEALTH INSURANCE | Admitting: Family Medicine

## 2012-07-25 VITALS — BP 120/60 | HR 81 | Temp 98.3°F | Resp 18 | Ht 61.5 in | Wt 139.0 lb

## 2012-07-25 DIAGNOSIS — I10 Essential (primary) hypertension: Secondary | ICD-10-CM

## 2012-07-25 DIAGNOSIS — M79674 Pain in right toe(s): Secondary | ICD-10-CM

## 2012-07-25 DIAGNOSIS — M79609 Pain in unspecified limb: Secondary | ICD-10-CM

## 2012-07-25 MED ORDER — DICLOFENAC SODIUM 3 % TD GEL: 1.0000 "application " | Freq: Three times a day (TID) | TRANSDERMAL | Status: DC

## 2012-07-25 MED ORDER — LISINOPRIL 10 MG PO TABS: 10.0000 mg | ORAL_TABLET | Freq: Every day | ORAL | Status: DC

## 2012-07-25 NOTE — Progress Notes (Signed)
71 year old woman who comes in with 2 issues. First she needs her blood pressure medicine refilled. Along that vein, she's had no shortness of breath, chest pain, edema or known elevated blood pressures.  Second, she's had intermittent pain in her right great toe. Her problems began when she had surgery by an orthopedic Dr. in [place] on her right toe one year ago. This surgery was not successful and patient had marked hallux adductus which need to be corrected by her doctor in [place] who performed a second operation. Since the second operation in April, patient has had sharp stabbing pains multiple times in the day about every 3 days. She's unable to take NSAIDs because of the stomach problems. She's tried Tylenol and that has also failed to curb the pain  Objective: Patient has a fused MTP joint of the right toe and is nontender.  HEENT unremarkable Chest clear Heart regular no murmur  Assessment: Neuropathic pain following surgical correction of orthopedic hallux adductus  Plan: Trial  Voltaren gel 3 times a day. If this fails we may have to resort to gabapentin Refill lisinopril

## 2012-07-27 ENCOUNTER — Telehealth: Payer: Self-pay

## 2012-07-27 MED ORDER — DICLOFENAC SODIUM 1 % TD GEL: 4.0000 g | Freq: Four times a day (QID) | TRANSDERMAL | Status: DC

## 2012-07-27 NOTE — Telephone Encounter (Signed)
Patient seen Dr. Milus Glazier on Saturday and was given Diclofenac gel 3% for pain. After insurance her out of pocket cost was $158 and she can't afford that. She has a friend that has a similar product Voltaren gel 1% that she would like called in if it works the same and if its cheaper than $158. Uses [facility] at [address] and [address] number is [phone]. Call back number for patient is [phone].

## 2012-07-27 NOTE — Telephone Encounter (Signed)
Sent in voltaren gel to subst.

## 2012-09-09 ENCOUNTER — Ambulatory Visit (INDEPENDENT_AMBULATORY_CARE_PROVIDER_SITE_OTHER): Payer: PRIVATE HEALTH INSURANCE | Admitting: Gynecology

## 2012-09-09 ENCOUNTER — Encounter: Payer: Self-pay | Admitting: Gynecology

## 2012-09-09 VITALS — BP 112/68 | Ht 60.75 in | Wt 139.0 lb

## 2012-09-09 DIAGNOSIS — K469 Unspecified abdominal hernia without obstruction or gangrene: Secondary | ICD-10-CM

## 2012-09-09 DIAGNOSIS — R141 Gas pain: Secondary | ICD-10-CM

## 2012-09-09 DIAGNOSIS — R140 Abdominal distension (gaseous): Secondary | ICD-10-CM

## 2012-09-09 DIAGNOSIS — N8111 Cystocele, midline: Secondary | ICD-10-CM

## 2012-09-09 DIAGNOSIS — N952 Postmenopausal atrophic vaginitis: Secondary | ICD-10-CM

## 2012-09-09 DIAGNOSIS — R350 Frequency of micturition: Secondary | ICD-10-CM

## 2012-09-09 LAB — URINALYSIS W MICROSCOPIC + REFLEX CULTURE
Bilirubin Urine: NEGATIVE
Ketones, ur: NEGATIVE mg/dL
Protein, ur: NEGATIVE mg/dL
RBC / HPF: NONE SEEN RBC/hpf (ref <3)
Urobilinogen, UA: 0.2 mg/dL (ref 0.0–1.0)

## 2012-09-09 NOTE — Patient Instructions (Addendum)
Follow up for bone study as scheduled. Follow up for ultrasound as scheduled. Follow up for annual exam in one year.

## 2012-09-09 NOTE — Progress Notes (Signed)
Kristine [name] September 28, 1941 [id number]        71 y.o.  G1P1 for follow up exam.  Several issues noted below.  Past medical history,surgical history, medications, allergies, family history and social history were all reviewed and documented in the EPIC chart. ROS:  Was performed and pertinent positives and negatives are included in the history.  Exam: Sherrilyn Rist assistant Filed Vitals:   09/09/12 0853  BP: 112/68  Height: 5' 0.75" (1.543 m)  Weight: 139 lb (63.05 kg)   General appearance  Normal Skin grossly normal Head/Neck normal with no cervical or supraclavicular adenopathy thyroid normal Lungs  clear Cardiac RR, without RMG Abdominal  soft, nontender, without masses, organomegaly or hernia Breasts  examined lying and sitting without masses, retractions, discharge or axillary adenopathy. Pelvic  Ext/BUS/vagina  Second degree cystocele with enterocele component. No rectocele. Atrophic changes noted.  Adnexa  Without masses or tenderness    Anus and perineum  normal   Rectovaginal  normal sphincter tone without palpated masses or tenderness.    Assessment/Plan:  71 y.o. G1P1 female for follow up exam.   1. Cystocele/enterocele/atrophic genital changes. Stable by exam. Patient asymptomatic. No longer doing any spotting. We'll continue to monitor. He is complaining of frequency but admits to drinking 4 cups of coffee in the morning. Check UA now. Recommended decreased caffeine and see how she does and she agrees with this.  2. Abdominal bloating. No nausea/vomiting, diarrhea/constipation. Exam today is normal.  Will check ultrasound rule out ovarian disease. Assuming negative then follow up with GI if continues. 3. Mammography. Patient has scheduled in November will follow up for this. SBE monthly reviewed. 4. DEXA. Patient has scheduled in November and will follow up for this.  Increased calcium vitamin D reviewed. 5. Colonoscopy. Patient had 2011. Will follow up at recommended  interval. 6. Pap smear. No Pap smear done today. Last Pap smear 2012. Discussed current recommendations. She has no history of abnormal Paps before. Is status post hysterectomy and over the age of 13. We'll plan stop screening now. 7. Health maintenance. Patient will continue to see her primary for routine health maintenance. No blood work done today as it is done through their office. Follow up for her DEXA/ultrasound/mammogram.  Otherwise follow up one year, sooner as needed. 8.     Dara Lords MD, 9:27 AM 09/09/2012

## 2012-09-11 DIAGNOSIS — M79676 Pain in unspecified toe(s): Secondary | ICD-10-CM | POA: Insufficient documentation

## 2012-09-11 LAB — URINE CULTURE
Colony Count: NO GROWTH
Organism ID, Bacteria: NO GROWTH

## 2012-09-22 ENCOUNTER — Emergency Department (HOSPITAL_BASED_OUTPATIENT_CLINIC_OR_DEPARTMENT_OTHER)
Admission: EM | Admit: 2012-09-22 | Discharge: 2012-09-22 | Disposition: A | Discharge status: Home / Self Care | Payer: PRIVATE HEALTH INSURANCE | Attending: Emergency Medicine | Admitting: Emergency Medicine

## 2012-09-22 ENCOUNTER — Encounter (HOSPITAL_BASED_OUTPATIENT_CLINIC_OR_DEPARTMENT_OTHER): Payer: Self-pay | Admitting: *Deleted

## 2012-09-22 ENCOUNTER — Emergency Department (HOSPITAL_BASED_OUTPATIENT_CLINIC_OR_DEPARTMENT_OTHER): Payer: PRIVATE HEALTH INSURANCE

## 2012-09-22 DIAGNOSIS — K2270 Barrett's esophagus without dysplasia: Secondary | ICD-10-CM | POA: Insufficient documentation

## 2012-09-22 DIAGNOSIS — R142 Eructation: Secondary | ICD-10-CM | POA: Insufficient documentation

## 2012-09-22 DIAGNOSIS — I10 Essential (primary) hypertension: Secondary | ICD-10-CM | POA: Insufficient documentation

## 2012-09-22 DIAGNOSIS — Z8249 Family history of ischemic heart disease and other diseases of the circulatory system: Secondary | ICD-10-CM | POA: Insufficient documentation

## 2012-09-22 DIAGNOSIS — N816 Rectocele: Secondary | ICD-10-CM | POA: Insufficient documentation

## 2012-09-22 DIAGNOSIS — K5732 Diverticulitis of large intestine without perforation or abscess without bleeding: Secondary | ICD-10-CM | POA: Insufficient documentation

## 2012-09-22 DIAGNOSIS — Z79899 Other long term (current) drug therapy: Secondary | ICD-10-CM | POA: Insufficient documentation

## 2012-09-22 DIAGNOSIS — N3289 Other specified disorders of bladder: Secondary | ICD-10-CM | POA: Insufficient documentation

## 2012-09-22 DIAGNOSIS — I7300 Raynaud's syndrome without gangrene: Secondary | ICD-10-CM | POA: Insufficient documentation

## 2012-09-22 DIAGNOSIS — K219 Gastro-esophageal reflux disease without esophagitis: Secondary | ICD-10-CM | POA: Insufficient documentation

## 2012-09-22 DIAGNOSIS — R141 Gas pain: Secondary | ICD-10-CM | POA: Insufficient documentation

## 2012-09-22 DIAGNOSIS — R197 Diarrhea, unspecified: Secondary | ICD-10-CM

## 2012-09-22 DIAGNOSIS — R109 Unspecified abdominal pain: Secondary | ICD-10-CM | POA: Insufficient documentation

## 2012-09-22 DIAGNOSIS — M129 Arthropathy, unspecified: Secondary | ICD-10-CM | POA: Insufficient documentation

## 2012-09-22 HISTORY — DX: Cr(e)st syndrome: M34.1

## 2012-09-22 LAB — COMPREHENSIVE METABOLIC PANEL
AST: 29 U/L (ref 0–37)
Albumin: 4 g/dL (ref 3.5–5.2)
Chloride: 102 mEq/L (ref 96–112)
Creatinine, Ser: 0.8 mg/dL (ref 0.50–1.10)
Potassium: 4.4 mEq/L (ref 3.5–5.1)
Total Bilirubin: 0.2 mg/dL — ABNORMAL LOW (ref 0.3–1.2)
Total Protein: 7.1 g/dL (ref 6.0–8.3)

## 2012-09-22 LAB — URINE MICROSCOPIC-ADD ON

## 2012-09-22 LAB — CBC WITH DIFFERENTIAL/PLATELET
Basophils Absolute: 0 10*3/uL (ref 0.0–0.1)
Basophils Relative: 0 % (ref 0–1)
Eosinophils Absolute: 0.3 10*3/uL (ref 0.0–0.7)
MCHC: 32.8 g/dL (ref 30.0–36.0)
Monocytes Absolute: 0.8 10*3/uL (ref 0.1–1.0)
Neutro Abs: 3.9 10*3/uL (ref 1.7–7.7)
Neutrophils Relative %: 57 % (ref 43–77)
RDW: 14 % (ref 11.5–15.5)

## 2012-09-22 LAB — URINALYSIS, ROUTINE W REFLEX MICROSCOPIC
Ketones, ur: NEGATIVE mg/dL
Nitrite: NEGATIVE
Protein, ur: NEGATIVE mg/dL

## 2012-09-22 MED ORDER — SODIUM CHLORIDE 0.9 % IV SOLN
INTRAVENOUS | Status: DC
  Administered 2012-09-22: 09:00:00 via INTRAVENOUS

## 2012-09-22 MED ORDER — IOHEXOL 300 MG/ML  SOLN
100.0000 mL | Freq: Once | INTRAMUSCULAR | Status: AC | PRN
  Administered 2012-09-22: 100 mL via INTRAVENOUS

## 2012-09-22 MED ORDER — SODIUM CHLORIDE 0.9 % IV BOLUS (SEPSIS)
250.0000 mL | Freq: Once | INTRAVENOUS | Status: AC
  Administered 2012-09-22: 250 mL via INTRAVENOUS

## 2012-09-22 MED ORDER — ONDANSETRON HCL 4 MG/2ML IJ SOLN
4.0000 mg | Freq: Once | INTRAMUSCULAR | Status: AC
  Administered 2012-09-22: 4 mg via INTRAVENOUS
  Filled 2012-09-22: qty 2

## 2012-09-22 MED ORDER — IOHEXOL 300 MG/ML  SOLN: 20.0000 mL | Freq: Once | INTRAMUSCULAR | Status: DC | PRN

## 2012-09-22 NOTE — ED Notes (Signed)
MD at bedside. 

## 2012-09-22 NOTE — ED Provider Notes (Signed)
History     CSN: [id number]  Arrival date & time 09/22/12  0719   First MD Initiated Contact with Patient 09/22/12 [id number]      Chief Complaint  Patient presents with  . Abdominal Pain    (Consider location/radiation/quality/duration/timing/severity/associated sxs/prior treatment) Patient is a 71 y.o. female presenting with abdominal pain. The history is provided by the patient.  Abdominal Pain The primary symptoms of the illness include abdominal pain and diarrhea. The primary symptoms of the illness do not include fever, shortness of breath, nausea, vomiting or dysuria.  Symptoms associated with the illness do not include back pain.   patient with history of abdominal bloating for 2 months. Onset of right-sided abdominal pain that was severe at its 7 this morning no nausea no vomiting 3 days ago she had severe diarrhea and used Imodium that stopped the diarrhea but still has loose bowel movements. Pain this morning was 10 out of 10 sharp and gurgling it is now improved to a 2/10. Pain radiated from the right side the rest of the abdomen. Patient has had a partial hysterectomy in the past.  Past Medical History  Diagnosis Date  . GERD (gastroesophageal reflux disease)   . Barrett's esophagus     Stage B  . Cystocele   . Rectocele     mild  . Hypertension   . Diverticulitis   . Arthritis   . Raynaud disease   . CREST variant of scleroderma     Past Surgical History  Procedure Date  . Tonsillectomy 1950  . Tubal ligation 1980  . Nissen fundoplication 2006  . Total knee arthroplasty     right  . Joint replacement 9/10    rt total  . Foot surgery 2013    x2  . Abdominal hysterectomy 1996    RSO for menorrhagia.    Family History  Problem Relation Age of Onset  . Cancer Mother     Pancreatic Cancer  . Heart disease Father     History  Substance Use Topics  . Smoking status: Never Smoker   . Smokeless tobacco: Never Used  . Alcohol Use: No    OB History    Grav Para Term Preterm Abortions TAB SAB Ect Mult Living   1 1        1       Review of Systems  Constitutional: Negative for fever.  HENT: Negative for neck pain.   Eyes: Negative for redness.  Respiratory: Negative for shortness of breath.   Cardiovascular: Negative for chest pain.  Gastrointestinal: Positive for abdominal pain and diarrhea. Negative for nausea and vomiting.  Genitourinary: Negative for dysuria.  Musculoskeletal: Negative for back pain.  Skin: Negative for rash.  Neurological: Negative for headaches.  Hematological: Does not bruise/bleed easily.    Allergies  Aspirin; Ibuprofen; Nsaids; and Statins  Home Medications   Current Outpatient Rx  Name Route Sig Dispense Refill  . DICLOFENAC SODIUM 1 % TD GEL Topical Apply 4 g topically 4 (four) times daily. 1 Tube 1  . LISINOPRIL 10 MG PO TABS Oral Take 1 tablet (10 mg total) by mouth daily. NEEDS OFFICE VISIT FOR MORE 30 tablet 11    Rx has expired - no refills remain  . MULTIPLE VITAMINS PO Oral Take by mouth.      . OMEPRAZOLE 20 MG PO CPDR Oral Take 20 mg by mouth 2 (two) times daily.      . TRAMADOL HCL 50 MG PO TABS Oral Take  50 mg by mouth every 6 (six) hours as needed.      BP 132/56  Pulse 63  Temp 98.1 F (36.7 C) (Oral)  Resp 18  SpO2 99%  LMP 11/25/1994  Physical Exam  Nursing note and vitals reviewed. Constitutional: She is oriented to person, place, and time. She appears well-developed and well-nourished. No distress.  HENT:  Head: Normocephalic and atraumatic.  Mouth/Throat: Oropharynx is clear and moist.  Eyes: Conjunctivae normal and EOM are normal. Pupils are equal, round, and reactive to light.  Neck: Normal range of motion. Neck supple.  Cardiovascular: Normal rate, regular rhythm and normal heart sounds.   No murmur heard. Pulmonary/Chest: Effort normal and breath sounds normal.  Abdominal: Bowel sounds are normal. She exhibits distension. There is tenderness.       Bowel sounds  normal to increased.  Musculoskeletal: Normal range of motion.  Neurological: She is alert and oriented to person, place, and time. No cranial nerve deficit. She exhibits normal muscle tone. Coordination normal.  Skin: Skin is warm. No rash noted.    ED Course  Procedures (including critical care time)  Labs Reviewed  URINALYSIS, ROUTINE W REFLEX MICROSCOPIC - Abnormal; Notable for the following:    Leukocytes, UA MODERATE (*)     All other components within normal limits  COMPREHENSIVE METABOLIC PANEL - Abnormal; Notable for the following:    Glucose, Bld 116 (*)     Total Bilirubin 0.2 (*)     GFR calc non Af Amer 74 (*)     GFR calc Af Amer 85 (*)     All other components within normal limits  URINE MICROSCOPIC-ADD ON - Abnormal; Notable for the following:    Squamous Epithelial / LPF FEW (*)     Bacteria, UA FEW (*)     All other components within normal limits  CBC WITH DIFFERENTIAL  LIPASE, BLOOD  URINE CULTURE   Ct Abdomen Pelvis W Contrast  09/22/2012  *RADIOLOGY REPORT*  Clinical Data: Right upper quadrant abdominal pain.  Bloating and diarrhea.  CT ABDOMEN AND PELVIS WITH CONTRAST  Technique:  Multidetector CT imaging of the abdomen and pelvis was performed following the standard protocol during bolus administration of intravenous contrast.  Contrast: OMNIPAQUE IOHEXOL 300 MG/ML  SOLN  Comparison: CT of the abdomen and pelvis 04/17/2009.  Findings:  Lung Bases: There is a moderate sized hiatal hernia through which the wrap from the Nissen fundoplication has herniated into the lower mediastinum.  Otherwise, unremarkable.  Abdomen/Pelvis:  The appearance of the liver, gallbladder, pancreas, spleen, bilateral adrenal glands and bilateral kidneys is unremarkable.  There is extensive atherosclerosis of the abdominal and pelvic vasculature, without definite aneurysm or dissection. Numerous colonic diverticula are noted, without surrounding inflammatory changes to suggest acute  diverticulitis at this time. Normal appendix.  Status post hysterectomy.  Ovaries are either atrophic or surgically absent.  Urinary bladder is unremarkable. No ascites or pneumoperitoneum and no pathologic distension of small bowel.  Musculoskeletal: There are no aggressive appearing lytic or blastic lesions noted in the visualized portions of the skeleton.  There are multilevel degenerative changes in the lumbar spine, with advanced degenerative disc disease at L1-L2, L2-L3 and L3-L4.  This is associated with 4 mm retrolisthesis of L3 upon L4 and 2 mm of retrolisthesis of L2 upon L3.  These findings are similar to the prior study from 04/17/2009.  IMPRESSION: 1.  Recurrent herniation of the Nissen wrap into the mediastinum. 2.  No other acute  findings in the abdomen or pelvis to account for the patient's symptoms. 3.  Colonic diverticulosis without findings to suggest acute diverticulitis at this time. 4.  Atherosclerosis. 5.  Normal appendix. 6.  Status post hysterectomy. 3.  Multilevel degenerative disc disease and lumbar spondylosis redemonstrated, as above.   Original Report Authenticated By: Florencia Reasons, M.D.    Results for orders placed during the hospital encounter of 09/22/12  URINALYSIS, ROUTINE W REFLEX MICROSCOPIC      Component Value Range   Color, Urine YELLOW  YELLOW   APPearance CLEAR  CLEAR   Specific Gravity, Urine 1.009  1.005 - 1.030   pH 6.0  5.0 - 8.0   Glucose, UA NEGATIVE  NEGATIVE mg/dL   Hgb urine dipstick NEGATIVE  NEGATIVE   Bilirubin Urine NEGATIVE  NEGATIVE   Ketones, ur NEGATIVE  NEGATIVE mg/dL   Protein, ur NEGATIVE  NEGATIVE mg/dL   Urobilinogen, UA 0.2  0.0 - 1.0 mg/dL   Nitrite NEGATIVE  NEGATIVE   Leukocytes, UA MODERATE (*) NEGATIVE  CBC WITH DIFFERENTIAL      Component Value Range   WBC 6.9  4.0 - 10.5 K/uL   RBC 4.50  3.87 - 5.11 MIL/uL   Hemoglobin 12.9  12.0 - 15.0 g/dL   HCT 40.9  81.1 - 91.4 %   MCV 87.3  78.0 - 100.0 fL   MCH 28.7  26.0 -  34.0 pg   MCHC 32.8  30.0 - 36.0 g/dL   RDW 78.2  95.6 - 21.3 %   Platelets 166  150 - 400 K/uL   Neutrophils Relative 57  43 - 77 %   Neutro Abs 3.9  1.7 - 7.7 K/uL   Lymphocytes Relative 28  12 - 46 %   Lymphs Abs 1.9  0.7 - 4.0 K/uL   Monocytes Relative 12  3 - 12 %   Monocytes Absolute 0.8  0.1 - 1.0 K/uL   Eosinophils Relative 4  0 - 5 %   Eosinophils Absolute 0.3  0.0 - 0.7 K/uL   Basophils Relative 0  0 - 1 %   Basophils Absolute 0.0  0.0 - 0.1 K/uL  COMPREHENSIVE METABOLIC PANEL      Component Value Range   Sodium 140  135 - 145 mEq/L   Potassium 4.4  3.5 - 5.1 mEq/L   Chloride 102  96 - 112 mEq/L   CO2 28  19 - 32 mEq/L   Glucose, Bld 116 (*) 70 - 99 mg/dL   BUN 12  6 - 23 mg/dL   Creatinine, Ser 0.86  0.50 - 1.10 mg/dL   Calcium 9.9  8.4 - 57.8 mg/dL   Total Protein 7.1  6.0 - 8.3 g/dL   Albumin 4.0  3.5 - 5.2 g/dL   AST 29  0 - 37 U/L   ALT 33  0 - 35 U/L   Alkaline Phosphatase 115  39 - 117 U/L   Total Bilirubin 0.2 (*) 0.3 - 1.2 mg/dL   GFR calc non Af Amer 74 (*) >90 mL/min   GFR calc Af Amer 85 (*) >90 mL/min  LIPASE, BLOOD      Component Value Range   Lipase 21  11 - 59 U/L  URINE MICROSCOPIC-ADD ON      Component Value Range   Squamous Epithelial / LPF FEW (*) RARE   WBC, UA 7-10  <3 WBC/hpf   RBC / HPF 0-2  <3 RBC/hpf   Bacteria, UA FEW (*)  RARE     1. Abdominal pain   2. Diarrhea       MDM  Workup in emergency part without any specific findings. However patient has had a [employer] wrap in the past but is now slid up above the diaphragm. Does not appear to be any evidence of obstruction and will think this is causing any severity at this point in time patient does have a GI Dr. and will followup with them. Continue to have the loose bowel movements consistent with a diarrhea we'll continue to take the Imodium. Will also followup with GI for this. Work note provided for today and tomorrow but the patient can rest. Labs without any acute findings no  significant leukocytosis no significant anemia LFTs are normal electrolytes are normal urinalysis with 7-10 ribs urine culture sent not she sure this is consistent with a urinary tract infection clinically. Will await culture results to make determination whether treatment is required. Patient feels much better pain has resolved no acute distress currently.        Shelda Jakes, MD 09/22/12 [phone]

## 2012-09-22 NOTE — ED Notes (Signed)
Patient states she has a one month history of abdominal bloating and was seen by her GYN who wants to do an ultrsound.  3 days ago she had severe diarrhea and used immodium to stop the diarrhea.  This morning she woke up with severe abdominal bloating and pain in the ruq of her abdomen.  Today she has several soft stools, denies nausea or vomiting.

## 2012-09-24 LAB — URINE CULTURE: Colony Count: 3000

## 2012-10-01 ENCOUNTER — Ambulatory Visit: Payer: PRIVATE HEALTH INSURANCE | Admitting: Gynecology

## 2012-10-01 ENCOUNTER — Other Ambulatory Visit: Payer: PRIVATE HEALTH INSURANCE

## 2012-10-07 ENCOUNTER — Other Ambulatory Visit: Payer: Self-pay | Admitting: Gynecology

## 2012-10-07 DIAGNOSIS — Z13820 Encounter for screening for osteoporosis: Secondary | ICD-10-CM

## 2012-10-13 ENCOUNTER — Ambulatory Visit (INDEPENDENT_AMBULATORY_CARE_PROVIDER_SITE_OTHER): Payer: PRIVATE HEALTH INSURANCE

## 2012-10-13 DIAGNOSIS — M899 Disorder of bone, unspecified: Secondary | ICD-10-CM

## 2012-10-13 DIAGNOSIS — Z13820 Encounter for screening for osteoporosis: Secondary | ICD-10-CM

## 2012-10-13 DIAGNOSIS — M8580 Other specified disorders of bone density and structure, unspecified site: Secondary | ICD-10-CM

## 2012-10-15 ENCOUNTER — Ambulatory Visit (INDEPENDENT_AMBULATORY_CARE_PROVIDER_SITE_OTHER): Payer: PRIVATE HEALTH INSURANCE | Admitting: Gynecology

## 2012-10-15 ENCOUNTER — Ambulatory Visit (INDEPENDENT_AMBULATORY_CARE_PROVIDER_SITE_OTHER): Payer: PRIVATE HEALTH INSURANCE

## 2012-10-15 ENCOUNTER — Encounter: Payer: Self-pay | Admitting: Gynecology

## 2012-10-15 DIAGNOSIS — R143 Flatulence: Secondary | ICD-10-CM

## 2012-10-15 DIAGNOSIS — R339 Retention of urine, unspecified: Secondary | ICD-10-CM

## 2012-10-15 DIAGNOSIS — M899 Disorder of bone, unspecified: Secondary | ICD-10-CM

## 2012-10-15 DIAGNOSIS — M949 Disorder of cartilage, unspecified: Secondary | ICD-10-CM

## 2012-10-15 DIAGNOSIS — M8580 Other specified disorders of bone density and structure, unspecified site: Secondary | ICD-10-CM

## 2012-10-15 DIAGNOSIS — R141 Gas pain: Secondary | ICD-10-CM

## 2012-10-15 DIAGNOSIS — R140 Abdominal distension (gaseous): Secondary | ICD-10-CM

## 2012-10-15 DIAGNOSIS — N83339 Acquired atrophy of ovary and fallopian tube, unspecified side: Secondary | ICD-10-CM

## 2012-10-15 NOTE — Patient Instructions (Signed)
Follow up in one year for GYN follow up exam.

## 2012-10-15 NOTE — Progress Notes (Signed)
Patient presents for 2 issues: 1. Abdominal bloating. Patient noted some abdominal bloating. No diarrhea constipation nausea vomiting. She does have a history of GERD and Niesen fundoplication. Ultrasound was ordered to rule out ovarian disease. 2. Recent DEXA 09/2012 with T score -2.4 forearm FRAX 13%/2.7%  Ultrasound status post hysterectomy. Right and left adnexa with no apparent masses. Cul-de-sac negative. Excessive bowel activity noted. Patient did have a postvoid residual requiring catheterization consistent with her cystocele history.  Assessment and plan: 1. Abdominal bloating. Ultrasound negative. I suspect this is GI related and she has an appointment with her gastroenterologist already arranged and we'll see them in follow up. 2. Osteopenia. No comparison studies. T score approaching osteoporosis in the forearm less impressive than the hips. Spine could not be done due to degenerative changes. FRAX does not indicate need to treat. I reviewed options to include treatment now based on her T score of -2.4 versus observation with repeat DEXA in 2 years. Maximizing calcium vitamin D in the interim.  She does have a history of Barrett's esophagus and oral bisphosphonates would be contraindicated. After lengthy discussion patient prefers observation with repeat DEXA in 2 years.

## 2012-10-27 ENCOUNTER — Telehealth: Payer: Self-pay | Admitting: Radiology

## 2012-10-27 ENCOUNTER — Ambulatory Visit (INDEPENDENT_AMBULATORY_CARE_PROVIDER_SITE_OTHER): Payer: PRIVATE HEALTH INSURANCE | Admitting: Family Medicine

## 2012-10-27 ENCOUNTER — Ambulatory Visit
Admission: RE | Admit: 2012-10-27 | Discharge: 2012-10-27 | Disposition: A | Discharge status: Home / Self Care | Payer: PRIVATE HEALTH INSURANCE | Source: Ambulatory Visit | Attending: Family Medicine | Admitting: Family Medicine

## 2012-10-27 VITALS — BP 132/82 | HR 90 | Temp 98.2°F | Resp 17 | Ht 61.5 in | Wt 140.0 lb

## 2012-10-27 DIAGNOSIS — H532 Diplopia: Secondary | ICD-10-CM

## 2012-10-27 DIAGNOSIS — R519 Headache, unspecified: Secondary | ICD-10-CM

## 2012-10-27 DIAGNOSIS — R51 Headache: Secondary | ICD-10-CM

## 2012-10-27 DIAGNOSIS — I10 Essential (primary) hypertension: Secondary | ICD-10-CM

## 2012-10-27 DIAGNOSIS — S0083XA Contusion of other part of head, initial encounter: Secondary | ICD-10-CM

## 2012-10-27 DIAGNOSIS — S0003XA Contusion of scalp, initial encounter: Secondary | ICD-10-CM

## 2012-10-27 DIAGNOSIS — S20219A Contusion of unspecified front wall of thorax, initial encounter: Secondary | ICD-10-CM

## 2012-10-27 LAB — BASIC METABOLIC PANEL
CO2: 29 mEq/L (ref 19–32)
Calcium: 9.7 mg/dL (ref 8.4–10.5)
Sodium: 142 mEq/L (ref 135–145)

## 2012-10-27 NOTE — Progress Notes (Signed)
Subjective:    Patient ID: Kristine [name], female    DOB: 09-10-1941, 71 y.o.   MRN: [id number]  HPI Kristine [name] is a 71 y.o. female  L eye injury - misstep yesterday on daughters driveway, tripped and falling forward, but tried to turn head - landed on l face and around L eye.  Scraped L face, bruising around eye. Scratched glasses, broken rim.  Had to use old prescription sunglasses to drive here. Numb senation above L eye today.  Trouble focusing with L eye today. Fuzzy vision, but not wearing glasses. Having double vision when looking up close at objects, distal objects   No amaurosis fugax.  No pain with moving eyes around.  Tx: warm compress. antiseptic cream on skin, ice few times yesterday.  Headache yesterday, not today. No LOC, no n/v/dizziness.   Optho: Yocum at [facility] - told to come here initially.  New rx called in but will take a week.   Sore in both arms and L side of back, but able to move arms and legs without difficulty.  R wrist sore yesterday, but not today.  No dyspnea, no new cough.  No chest pain - just sore on flank.  Also here for med refill.  No outside BP's.  No new side effects with BP meds. No new cough. Has refill available at drug store.    Seen in hospital for abdominal pain in 10/13:  Results for orders placed during the hospital encounter of 09/22/12  URINALYSIS, ROUTINE W REFLEX MICROSCOPIC      Component Value Range   Color, Urine YELLOW  YELLOW   APPearance CLEAR  CLEAR   Specific Gravity, Urine 1.009  1.005 - 1.030   pH 6.0  5.0 - 8.0   Glucose, UA NEGATIVE  NEGATIVE mg/dL   Hgb urine dipstick NEGATIVE  NEGATIVE   Bilirubin Urine NEGATIVE  NEGATIVE   Ketones, ur NEGATIVE  NEGATIVE mg/dL   Protein, ur NEGATIVE  NEGATIVE mg/dL   Urobilinogen, UA 0.2  0.0 - 1.0 mg/dL   Nitrite NEGATIVE  NEGATIVE   Leukocytes, UA MODERATE (*) NEGATIVE  CBC WITH DIFFERENTIAL      Component Value Range   WBC 6.9  4.0 - 10.5 K/uL   RBC 4.50  3.87 - 5.11 MIL/uL   Hemoglobin 12.9  12.0 - 15.0 g/dL   HCT 14.7  82.9 - 56.2 %   MCV 87.3  78.0 - 100.0 fL   MCH 28.7  26.0 - 34.0 pg   MCHC 32.8  30.0 - 36.0 g/dL   RDW 13.0  86.5 - 78.4 %   Platelets 166  150 - 400 K/uL   Neutrophils Relative 57  43 - 77 %   Neutro Abs 3.9  1.7 - 7.7 K/uL   Lymphocytes Relative 28  12 - 46 %   Lymphs Abs 1.9  0.7 - 4.0 K/uL   Monocytes Relative 12  3 - 12 %   Monocytes Absolute 0.8  0.1 - 1.0 K/uL   Eosinophils Relative 4  0 - 5 %   Eosinophils Absolute 0.3  0.0 - 0.7 K/uL   Basophils Relative 0  0 - 1 %   Basophils Absolute 0.0  0.0 - 0.1 K/uL  COMPREHENSIVE METABOLIC PANEL      Component Value Range   Sodium 140  135 - 145 mEq/L   Potassium 4.4  3.5 - 5.1 mEq/L   Chloride 102  96 - 112 mEq/L   CO2 28  19 - 32 mEq/L   Glucose, Bld 116 (*) 70 - 99 mg/dL   BUN 12  6 - 23 mg/dL   Creatinine, Ser 1.61  0.50 - 1.10 mg/dL   Calcium 9.9  8.4 - 09.6 mg/dL   Total Protein 7.1  6.0 - 8.3 g/dL   Albumin 4.0  3.5 - 5.2 g/dL   AST 29  0 - 37 U/L   ALT 33  0 - 35 U/L   Alkaline Phosphatase 115  39 - 117 U/L   Total Bilirubin 0.2 (*) 0.3 - 1.2 mg/dL   GFR calc non Af Amer 74 (*) >90 mL/min   GFR calc Af Amer 85 (*) >90 mL/min  LIPASE, BLOOD      Component Value Range   Lipase 21  11 - 59 U/L  URINE MICROSCOPIC-ADD ON      Component Value Range   Squamous Epithelial / LPF FEW (*) RARE   WBC, UA 7-10  <3 WBC/hpf   RBC / HPF 0-2  <3 RBC/hpf   Bacteria, UA FEW (*) RARE  URINE CULTURE      Component Value Range   Specimen Description URINE, CLEAN CATCH     Special Requests NONE     Culture  Setup Time 09/23/2012 01:45     Colony Count 3,000 COLONIES/ML     Culture INSIGNIFICANT GROWTH     Report Status 09/24/2012 FINAL       Review of Systems  Constitutional: Negative for fatigue and unexpected weight change.  HENT: Negative for nosebleeds.   Eyes: Positive for pain and visual disturbance. Negative for photophobia.  Respiratory: Negative for chest tightness  and shortness of breath.   Cardiovascular: Negative for chest pain, palpitations and leg swelling.  Gastrointestinal: Negative for abdominal pain and blood in stool.  Neurological: Negative for dizziness, syncope, light-headedness and headaches (yesterday only. ).       Objective:   Physical Exam  Constitutional: She is oriented to person, place, and time. She appears well-developed and well-nourished.  HENT:  Head: Normocephalic and atraumatic. Head is without Battle's sign.    Right Ear: Hearing and external ear normal. No hemotympanum.  Left Ear: Hearing and external ear normal. There is hemotympanum.  Nose: No nasal septal hematoma. Left sinus exhibits maxillary sinus tenderness and frontal sinus tenderness.    Eyes: Conjunctivae normal and EOM are normal. Pupils are equal, round, and reactive to light.  Neck: Carotid bruit is not present.  Cardiovascular: Normal rate, regular rhythm, normal heart sounds and intact distal pulses.   Pulmonary/Chest: Effort normal and breath sounds normal. No respiratory distress.   She exhibits tenderness.       Mild diffuse, lateral chest wall ttp bilaterally.  Abdominal: Soft. She exhibits no pulsatile midline mass. There is no tenderness.  Musculoskeletal:       Right shoulder: Normal. She exhibits no tenderness and no bony tenderness.       Left shoulder: She exhibits no tenderness and no bony tenderness.       Right elbow: She exhibits normal range of motion. no tenderness found.       Left elbow: She exhibits normal range of motion. no tenderness found.       Right wrist: She exhibits no bony tenderness.       Left wrist: She exhibits no bony tenderness.       Cervical back: She exhibits normal range of motion and no bony tenderness.  Neurological: She is alert and oriented  to person, place, and time.  Skin: Skin is warm and dry. Ecchymosis noted.  Psychiatric: She has a normal mood and affect. Her behavior is normal.            Assessment & Plan:  Kristine [name] is a 71 y.o. female 1. Unspecified essential hypertension  Basic metabolic panel  2. Pain, face  CT OrbitsS W/O CM  3. Contusion of face  CT OrbitsS W/O CM  4. Diplopia  CT OrbitsS W/O CM  5. Contusion of chest wall      HTN - stable, controlled. Already received refill., check BMP. Ok to refill meds for 3-6 months, but needs follow up with Dr. Cleta Alberts in next 6 months.   Fall, with contusion to L orbit, blurry vision with diplopia up close only. Visual acuity equal with old sunglass rx. Will check CT orbit today.,  If negative and still blurry tomorrow, may need optho eval.  tbd on ct results.  Chest wall contusion - sx care.tylenol as needed. As no focal area of ttp. , no dyspnea - xr deferred, but if localizes or dyspnea - rtc or er.  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  Patient Instructions   Go to [facility] now for your CT scan. I spoke to Tucker at [facility], they handle prior auth for CT scans, Mare Loan states no precertification is required. Her # is [phone].   Driving directions to [address] [place], [place], [place] [address] [id number]  - more info    [address]  [place], [place] [address]     1. Head south on [address] toward [address]      0.5 mi    2. Sharp left onto [address]      0.6 mi    3. Turn left onto the [address] ramp      0.2 mi    4. Merge onto [address]      3.0 mi    5. Continue straight to stay on [address]      0.4 mi    6. Slight left to stay on [facility]  Destination will be on the right     1.0 mi     [address] [place], [place] [address]    We will call you about the CT scan, you may need to follow up with your Eye Doctor. You need follow up with Dr Cleta Alberts in the next 6 months for your blood pressure   Head Injury, Adult You have had a head injury that does not appear serious at this time. A concussion is a state of changed mental ability, usually from a blow to the head. You should  take clear liquids for the rest of the day and then resume your regular diet. You should not take sedatives or alcoholic beverages for as long as directed by your caregiver after discharge. After injuries such as yours, most problems occur within the first 24 hours. SYMPTOMS These minor symptoms may be experienced after discharge:  Memory difficulties.  Dizziness.  Headaches.  Double vision.  Hearing difficulties.  Depression.  Tiredness.  Weakness.  Difficulty with concentration. If you experience any of these problems, you should not be alarmed. A concussion requires a few days for recovery. Many patients with head injuries frequently experience such symptoms. Usually, these problems disappear without medical care. If symptoms last for more than one day, notify your caregiver. See your  caregiver sooner if symptoms are becoming worse rather than better. HOME CARE INSTRUCTIONS   During the next 24 hours you must stay with someone who can watch you for the warning signs listed below. Although it is unlikely that serious side effects will occur, you should be aware of signs and symptoms which may necessitate your return to this location. Side effects may occur up to 7  10 days following the injury. It is important for you to carefully monitor your condition and contact your caregiver or seek immediate medical attention if there is a change in your condition. SEEK IMMEDIATE MEDICAL CARE IF:   There is confusion or drowsiness.  You can not awaken the injured person.  There is nausea (feeling sick to your stomach) or continued, forceful vomiting.  You notice dizziness or unsteadiness which is getting worse, or inability to walk.  You have convulsions or unconsciousness.  You experience severe, persistent headaches not relieved by over-the-counter or prescription medicines for pain. (Do not take aspirin as this impairs clotting abilities). Take other pain medications only as  directed.  You can not use arms or legs normally.  There is clear or bloody discharge from the nose or ears. MAKE SURE YOU:   Understand these instructions.  Will watch your condition.  Will get help right away if you are not doing well or get worse. Document Released: 11/11/2005 Document Revised: 02/03/2012 Document Reviewed: 09/29/2009 [facility] Patient Information 2013 [place], [place].

## 2012-10-27 NOTE — Progress Notes (Signed)
I have called [facility] for the CT scan of the orbit. Patient is going today to be worked in for the study. I spoke to [place] at [facility] , they handle precerts for [facility] with [employer]. Mare Loan has indicated there is no precertification needed for the CT scan. Her phone  # is [phone].

## 2012-10-27 NOTE — Patient Instructions (Addendum)
Go to [facility] now for your CT scan. I spoke to [place] at [facility], they handle prior auth for CT scans, Mare Loan states no precertification is required. Her # is [phone].   Driving directions to [address] [place], [place], [place] [id number]  - more info    [address]  [place], [place] [address]     1. Head south on [address] toward [address]      0.5 mi    2. Sharp left onto [address]      0.6 mi    3. Turn left onto the [address] ramp      0.2 mi    4. Merge onto [address]      3.0 mi    5. Continue straight to stay on [address]      0.4 mi    6. Slight left to stay on [facility]  Destination will be on the right     1.0 mi     [address] [place], [place] [address]    We will call you about the CT scan, you may need to follow up with your Eye Doctor. You need follow up with Dr Cleta Alberts in the next 6 months for your blood pressure   Head Injury, Adult You have had a head injury that does not appear serious at this time. A concussion is a state of changed mental ability, usually from a blow to the head. You should take clear liquids for the rest of the day and then resume your regular diet. You should not take sedatives or alcoholic beverages for as long as directed by your caregiver after discharge. After injuries such as yours, most problems occur within the first 24 hours. SYMPTOMS These minor symptoms may be experienced after discharge:  Memory difficulties.  Dizziness.  Headaches.  Double vision.  Hearing difficulties.  Depression.  Tiredness.  Weakness.  Difficulty with concentration. If you experience any of these problems, you should not be alarmed. A concussion requires a few days for recovery. Many patients with head injuries frequently experience such symptoms. Usually, these problems disappear without medical care. If symptoms last for more than one day, notify your caregiver. See your caregiver sooner if symptoms  are becoming worse rather than better. HOME CARE INSTRUCTIONS   During the next 24 hours you must stay with someone who can watch you for the warning signs listed below. Although it is unlikely that serious side effects will occur, you should be aware of signs and symptoms which may necessitate your return to this location. Side effects may occur up to 7  10 days following the injury. It is important for you to carefully monitor your condition and contact your caregiver or seek immediate medical attention if there is a change in your condition. SEEK IMMEDIATE MEDICAL CARE IF:   There is confusion or drowsiness.  You can not awaken the injured person.  There is nausea (feeling sick to your stomach) or continued, forceful vomiting.  You notice dizziness or unsteadiness which is getting worse, or inability to walk.  You have convulsions or unconsciousness.  You experience severe, persistent headaches not relieved by over-the-counter or prescription medicines for pain. (Do not take aspirin as this impairs clotting abilities). Take other pain medications only as directed.  You can not use arms or legs normally.  There is clear or bloody discharge from the nose or ears.  MAKE SURE YOU:   Understand these instructions.  Will watch your condition.  Will get help right away if you are not doing well or get worse. Document Released: 11/11/2005 Document Revised: 02/03/2012 Document Reviewed: 09/29/2009 [facility] Patient Information 2013 [place], [place].

## 2012-10-27 NOTE — Telephone Encounter (Signed)
Call report from [facility] imaging patient has left periorbital soft tissue swelling scan negative for fracture. She was advised the scan did not show fracture and we will call with further instructions.

## 2012-10-27 NOTE — Telephone Encounter (Signed)
Left message on home phone machine - advised ct scan"ok" but can discuss in more detail with a call tomorrow.

## 2012-10-28 ENCOUNTER — Telehealth: Payer: Self-pay | Admitting: Family Medicine

## 2012-10-28 NOTE — Telephone Encounter (Signed)
Advised of results of CT from yesterday.  She is in Dr. Loyal Gambler office now for eval.

## 2012-11-03 NOTE — Telephone Encounter (Signed)
Dr Neva Seat has taken care of her.

## 2012-12-06 ENCOUNTER — Ambulatory Visit (INDEPENDENT_AMBULATORY_CARE_PROVIDER_SITE_OTHER): Payer: PRIVATE HEALTH INSURANCE | Admitting: Internal Medicine

## 2012-12-06 VITALS — BP 156/79 | HR 84 | Temp 98.6°F | Resp 18 | Ht 61.4 in | Wt 139.0 lb

## 2012-12-06 DIAGNOSIS — R509 Fever, unspecified: Secondary | ICD-10-CM

## 2012-12-06 DIAGNOSIS — R059 Cough, unspecified: Secondary | ICD-10-CM

## 2012-12-06 DIAGNOSIS — R05 Cough: Secondary | ICD-10-CM

## 2012-12-06 LAB — POCT INFLUENZA A/B
Influenza A, POC: NEGATIVE
Influenza B, POC: NEGATIVE

## 2012-12-06 MED ORDER — AZITHROMYCIN 250 MG PO TABS: ORAL_TABLET | ORAL | Status: DC

## 2012-12-06 MED ORDER — HYDROCOD POLST-CHLORPHEN POLST 10-8 MG/5ML PO LQCR: 5.0000 mL | Freq: Two times a day (BID) | ORAL | Status: DC | PRN

## 2012-12-06 NOTE — Patient Instructions (Signed)
Cough, Adult   A cough is a reflex that helps clear your throat and airways. It can help heal the body or may be a reaction to an irritated airway. A cough may only last 2 or 3 weeks (acute) or may last more than 8 weeks (chronic).   CAUSES  Acute cough:   Viral or bacterial infections.  Chronic cough:   Infections.   Allergies.   Asthma.   Post-nasal drip.   Smoking.   Heartburn or acid reflux.   Some medicines.   Chronic lung problems (COPD).   Cancer.  SYMPTOMS    Cough.   Fever.   Chest pain.   Increased breathing rate.   High-pitched whistling sound when breathing (wheezing).   Colored mucus that you cough up (sputum).  TREATMENT    A bacterial cough may be treated with antibiotic medicine.   A viral cough must run its course and will not respond to antibiotics.   Your caregiver may recommend other treatments if you have a chronic cough.  HOME CARE INSTRUCTIONS    Only take over-the-counter or prescription medicines for pain, discomfort, or fever as directed by your caregiver. Use cough suppressants only as directed by your caregiver.   Use a cold steam vaporizer or humidifier in your bedroom or home to help loosen secretions.   Sleep in a semi-upright position if your cough is worse at night.   Rest as needed.   Stop smoking if you smoke.  SEEK IMMEDIATE MEDICAL CARE IF:    You have pus in your sputum.   Your cough starts to worsen.   You cannot control your cough with suppressants and are losing sleep.   You begin coughing up blood.   You have difficulty breathing.   You develop pain which is getting worse or is uncontrolled with medicine.   You have a fever.  MAKE SURE YOU:    Understand these instructions.   Will watch your condition.   Will get help right away if you are not doing well or get worse.  Document Released: 05/10/2011 Document Revised: 02/03/2012 Document Reviewed: 05/10/2011  [employer] Patient Information 2013 [employer].  Fever   Fever is a  higher-than-normal body temperature. A normal temperature varies with:   Age.   How it is measured (mouth, underarm, rectal, or ear).   Time of day.  In an adult, an oral temperature around 98.6 Fahrenheit (F) or 37 Celsius (C) is considered normal. A rise in temperature of about 1.8 F or 1 C is generally considered a fever (100.4 F or 38 C). In an infant age 28 days or less, a rectal temperature of 100.4 F (38 C) generally is regarded as fever. Fever is not a disease but can be a symptom of illness.  CAUSES    Fever is most commonly caused by infection.   Some non-infectious problems can cause fever. For example:   Some arthritis problems.   Problems with the thyroid or adrenal glands.   Immune system problems.   Some kinds of cancer.   A reaction to certain medicines.   Occasionally, the source of a fever cannot be determined. This is sometimes called a "Fever of Unknown Origin" (FUO).   Some situations may lead to a temporary rise in body temperature that may go away on its own. Examples are:   Childbirth.   Surgery.   Some situations may cause a rise in body temperature but these are not considered "true fever". Examples are:     Intense exercise.   Dehydration.   Exposure to high outside or room temperatures.  SYMPTOMS    Feeling warm or hot.   Fatigue or feeling exhausted.   Aching all over.   Chills.   Shivering.   Sweats.  DIAGNOSIS   A fever can be suspected by your caregiver feeling that your skin is unusually warm. The fever is confirmed by taking a temperature with a thermometer. Temperatures can be taken different ways. Some methods are accurate and some are not:  With adults, adolescents, and children:    An oral temperature is used most commonly.   An ear thermometer will only be accurate if it is positioned as recommended by the manufacturer.   Under the arm temperatures are not accurate and not recommended.   Most electronic thermometers are fast and  accurate.  Infants and Toddlers:   Rectal temperatures are recommended and most accurate.   Ear temperatures are not accurate in this age group and are not recommended.   Skin thermometers are not accurate.  RISKS AND COMPLICATIONS    During a fever, the body uses more oxygen, so a person with a fever may develop rapid breathing or shortness of breath. This can be dangerous especially in people with heart or lung disease.   The sweats that occur following a fever can cause dehydration.   High fever can cause seizures in infants and children.   Older persons can develop confusion during a fever.  TREATMENT    Medications may be used to control temperature.   Do not give aspirin to children with fevers. There is an association with Reye's syndrome. Reye's syndrome is a rare but potentially deadly disease.   If an infection is present and medications have been prescribed, take them as directed. Finish the full course of medications until they are gone.   Sponging or bathing with room-temperature water may help reduce body temperature. Do not use ice water or alcohol sponge baths.   Do not over-bundle children in blankets or heavy clothes.   Drinking adequate fluids during an illness with fever is important to prevent dehydration.  HOME CARE INSTRUCTIONS    For adults, rest and adequate fluid intake are important. Dress according to how you feel, but do not over-bundle.   Drink enough water and/or fluids to keep your urine clear or pale yellow.   For infants over 3 months and children, giving medication as directed by your caregiver to control fever can help with comfort. The amount to be given is based on the child's weight. Do NOT give more than is recommended.  SEEK MEDICAL CARE IF:    You or your child are unable to keep fluids down.   Vomiting or diarrhea develops.   You develop a skin rash.   An oral temperature above 102 F (38.9 C) develops, or a fever which persists for over 3 days.   You  develop excessive weakness, dizziness, fainting or extreme thirst.   Fevers keep coming back after 3 days.  SEEK IMMEDIATE MEDICAL CARE IF:    Shortness of breath or trouble breathing develops   You pass out.   You feel you are making little or no urine.   New pain develops that was not there before (such as in the head, neck, chest, back, or abdomen).   You cannot hold down fluids.   Vomiting and diarrhea persist for more than a day or two.   You develop a stiff neck and/or your eyes

## 2012-12-06 NOTE — Progress Notes (Signed)
  Subjective:    Patient ID: Kristine [name], female    DOB: 06-Jun-1941, 72 y.o.   MRN: [id number]  HPI 2days of cough, fever, body aches, head congestion. No sob, chest aches from coughing. Has had pneumovax and flu shot.    Review of Systems See hx    Objective:   Physical Exam  Vitals reviewed. Constitutional: She is oriented to person, place, and time. She appears well-developed and well-nourished. No distress.  HENT:  Right Ear: External ear normal.  Left Ear: External ear normal.  Nose: Mucosal edema and rhinorrhea present. No sinus tenderness. Right sinus exhibits no maxillary sinus tenderness and no frontal sinus tenderness. Left sinus exhibits no maxillary sinus tenderness and no frontal sinus tenderness.  Mouth/Throat: Oropharynx is clear and moist.  Neck: Neck supple.  Cardiovascular: Normal rate, regular rhythm and normal heart sounds.   Pulmonary/Chest: Effort normal and breath sounds normal. She has no wheezes. She has no rales.  Lymphadenopathy:    She has no cervical adenopathy.  Neurological: She is alert and oriented to person, place, and time.  Skin: Skin is warm and dry.  Psychiatric: She has a normal mood and affect.    Flu test Results for orders placed in visit on 12/06/12  POCT INFLUENZA A/B      Component Value Range   Influenza A, POC Negative     Influenza B, POC Negative          Assessment & Plan:  Cough/fever

## 2013-01-13 ENCOUNTER — Encounter: Payer: Self-pay | Admitting: Emergency Medicine

## 2013-01-14 ENCOUNTER — Telehealth: Payer: Self-pay | Admitting: *Deleted

## 2013-01-14 NOTE — Telephone Encounter (Signed)
Pt calling c/o breakthrough bleeding x 5 days now. Left message pt to call.

## 2013-01-14 NOTE — Telephone Encounter (Signed)
Patient needs office visit.  

## 2013-01-14 NOTE — Telephone Encounter (Signed)
Pt called c/o breakthrough bleeding x 5 days straight now, not heavy, but does fill up panty liner. Bleeding has been off & on with bleeding, but she would she bleed everyday. She will be leaving town on Saturday and will return back on Tuesday evening next week. OV with anything else ordered? Please advise

## 2013-01-15 NOTE — Telephone Encounter (Signed)
Patient called today to let me know that she has not had any vaginal bleeding yesterday or today.  I told her that Dr. Velvet Bathe recommended an office visit.  I explained that at her age vaginal bleeding could be an indicator that something is wrong although it may be nothing but Dr. Velvet Bathe needs to check her to make sure all okay.  She is agreeable to this but said due to being away and making up work she cannot come until March 3rd.  I transferred her to appts to speak w Debarah Crape and schedule.

## 2013-01-29 ENCOUNTER — Ambulatory Visit: Payer: PRIVATE HEALTH INSURANCE | Admitting: Gynecology

## 2013-02-01 ENCOUNTER — Encounter: Payer: Self-pay | Admitting: Gynecology

## 2013-02-01 ENCOUNTER — Ambulatory Visit (INDEPENDENT_AMBULATORY_CARE_PROVIDER_SITE_OTHER): Payer: PRIVATE HEALTH INSURANCE | Admitting: Gynecology

## 2013-02-01 DIAGNOSIS — K469 Unspecified abdominal hernia without obstruction or gangrene: Secondary | ICD-10-CM

## 2013-02-01 DIAGNOSIS — N8111 Cystocele, midline: Secondary | ICD-10-CM

## 2013-02-01 LAB — URINALYSIS W MICROSCOPIC + REFLEX CULTURE
Bilirubin Urine: NEGATIVE
Glucose, UA: NEGATIVE mg/dL
Hgb urine dipstick: NEGATIVE
Protein, ur: NEGATIVE mg/dL
RBC / HPF: NONE SEEN RBC/hpf (ref <3)
Urobilinogen, UA: 0.2 mg/dL (ref 0.0–1.0)

## 2013-02-01 NOTE — Addendum Note (Signed)
Addended by: Dayna Barker on: 02/01/2013 03:40 PM   Modules accepted: Orders

## 2013-02-01 NOTE — Progress Notes (Signed)
Patient presents complaining of vaginal spotting. History of this previously which we attributed to her prolapse. She is status post hysterectomy with second degree cystocele and enterocele component. No urinary or GI symptoms such as frequency dysuria urgency diarrhea constipation or rectal pain.  Exam with Kim Asst. Abdomen soft nontender without masses guarding rebound organomegaly. Pelvic external BUS vagina atrophic changes with second degree cystocele/enterocele component. No palpable masses or tenderness on bimanual.  Rectal vaginal exam shows mild rectocele.  No evidence of bleeding/lesions or other abnormalities.  Assessment and plan:  Cystocele/enterocele component with irritation. We discussed previously I reviewed again her options include observation trial of pessary or surgery to include possible mesh with apical support. Patient was interested in trying a pessary and we fitted her with a Gellhorn 2-1/2 inch pessary. Of note she did have some spotting with placement around the posterior fourchette area. Patient ambulated and did various maneuvers without discomfort or expulsion of the pessary. Patient will followup for pessary fitting after we order her pessary and it arrives. She is not sexually active.  Urinalysis was ordered to assure that this is not the source of her bleeding.

## 2013-02-01 NOTE — Patient Instructions (Signed)
Office will contact you when the pessary comes in and we will make an appointment for the fitting.

## 2013-02-03 ENCOUNTER — Other Ambulatory Visit: Payer: Self-pay | Admitting: Gynecology

## 2013-02-03 DIAGNOSIS — R8271 Bacteriuria: Secondary | ICD-10-CM

## 2013-02-03 LAB — URINE CULTURE: Colony Count: 35000

## 2013-02-05 ENCOUNTER — Telehealth: Payer: Self-pay

## 2013-02-05 MED ORDER — ALPRAZOLAM 0.25 MG PO TABS: 0.2500 mg | ORAL_TABLET | Freq: Four times a day (QID) | ORAL | Status: DC | PRN

## 2013-02-05 NOTE — Telephone Encounter (Signed)
Advised pt that rx was faxed to pharmacy

## 2013-02-05 NOTE — Telephone Encounter (Signed)
Can we pull her paper chart and see who gave it to her?  Who is her PCP they would be the appropriate person to write this Rx.

## 2013-02-05 NOTE — Telephone Encounter (Signed)
Patient is in [place] with her 70 year old father who is about to have heart surgery.   She is requesting xanax   [facility] on [address]  [phone]  CBN:  [phone]

## 2013-02-05 NOTE — Telephone Encounter (Signed)
Chart pulled, it is at desk.

## 2013-02-05 NOTE — Telephone Encounter (Signed)
Rx ok'ed by Dr. Cleta Alberts. Printed and ready to be sent

## 2013-02-05 NOTE — Telephone Encounter (Signed)
We do not have this medication in her list, not appropriate to start new meds over the phone. She states she has had this last about a year ago, feels the need for this now. Please advise.

## 2013-02-06 ENCOUNTER — Telehealth: Payer: Self-pay

## 2013-02-06 NOTE — Telephone Encounter (Signed)
Pt called yesterday to get a rx for xanax which was approved by dr Cleta Alberts but when she went to pharmacy to get it is was an antibodic, and would like to know if we can redo the request  And send it [facility] on [address] so that her daughter can pick it up and bring it with her when she comes to see her tomorrow out of town  Best number is [phone]

## 2013-02-07 NOTE — Telephone Encounter (Signed)
Pt notified that rx was called into pharmacy.  The [facility] in [place] do not take out of state rx's

## 2013-02-08 ENCOUNTER — Encounter: Payer: Self-pay | Admitting: Emergency Medicine

## 2013-02-19 ENCOUNTER — Telehealth: Payer: Self-pay | Admitting: *Deleted

## 2013-02-19 NOTE — Telephone Encounter (Signed)
Pt called and left message in voicemail c/o greenish yellowish discharge, she has appointment on 02/26/13 with TF for pessary. She asked if OTC medication available for BV. I left on pt voicemail there is medication OTC for this but OV best to treat correct infection. I told pt to call.

## 2013-02-26 ENCOUNTER — Encounter: Payer: Self-pay | Admitting: Gynecology

## 2013-02-26 ENCOUNTER — Ambulatory Visit (INDEPENDENT_AMBULATORY_CARE_PROVIDER_SITE_OTHER): Payer: PRIVATE HEALTH INSURANCE | Admitting: Gynecology

## 2013-02-26 DIAGNOSIS — N8111 Cystocele, midline: Secondary | ICD-10-CM

## 2013-02-26 DIAGNOSIS — Z4689 Encounter for fitting and adjustment of other specified devices: Secondary | ICD-10-CM

## 2013-02-26 NOTE — Patient Instructions (Signed)
Followup in one to 2 weeks for reexamination and pessary followup

## 2013-02-26 NOTE — Progress Notes (Signed)
Patient presents for pessary placement. Has significant cystocele and was fitted for a Gellhorn 2-1/2 pessary and returns now for placement.  Exam with Selena Batten assistant External BUS vagina with atrophic changes. Second-degree to third-degree cystocele with straining. Gellhorn 2-1/2 pessary placed. Patient got up and ambulated and did well without feeling the pessary and it remained in place.  Assessment and plan: Significant cystocele successful pessary placement. Patient will follow up in one to 2 weeks for reexamination, sooner if any issues.

## 2013-03-01 ENCOUNTER — Telehealth: Payer: Self-pay | Admitting: *Deleted

## 2013-03-01 NOTE — Telephone Encounter (Signed)
Okay to leave it out but it is worth a try to replace it and see how she feels. Also we could try a smaller pessary to see how she does with that.

## 2013-03-01 NOTE — Telephone Encounter (Signed)
Pt informed with the below, pt asked for u/a result, u/a never ran. I checked with lab and pt will need to come back to leave u/a. I left on pt voicemail to make lab appt. To repeat u/a

## 2013-03-01 NOTE — Telephone Encounter (Signed)
Pt had pessary placed on 03/08/13. Pt said that same day she removed pessary because of the discomfort and the urge to urinate. She states her bladder was full that day of placement and when returning home to urinate only small amount of urine came out. So she took it out and emptied her bladder completing and felt better, she did notice some bleeding, but it stopped. Pt said if okay she will leave it out for now. Please advise

## 2013-03-04 ENCOUNTER — Ambulatory Visit (INDEPENDENT_AMBULATORY_CARE_PROVIDER_SITE_OTHER): Payer: PRIVATE HEALTH INSURANCE | Admitting: Internal Medicine

## 2013-03-04 VITALS — BP 128/68 | HR 78 | Temp 98.1°F | Resp 16 | Ht 61.5 in | Wt 140.0 lb

## 2013-03-04 DIAGNOSIS — N390 Urinary tract infection, site not specified: Secondary | ICD-10-CM

## 2013-03-04 DIAGNOSIS — R6883 Chills (without fever): Secondary | ICD-10-CM

## 2013-03-04 LAB — POCT URINALYSIS DIPSTICK
Blood, UA: NEGATIVE
Nitrite, UA: NEGATIVE
Protein, UA: NEGATIVE
Urobilinogen, UA: 0.2
pH, UA: 7

## 2013-03-04 LAB — POCT UA - MICROSCOPIC ONLY
Casts, Ur, LPF, POC: NEGATIVE
Crystals, Ur, HPF, POC: NEGATIVE
Renal tubular cells: POSITIVE
Yeast, UA: NEGATIVE

## 2013-03-04 MED ORDER — CEFTRIAXONE SODIUM 1 G IJ SOLR
1.0000 g | Freq: Once | INTRAMUSCULAR | Status: AC
Start: 2013-03-04 — End: 2013-03-04
  Administered 2013-03-04: 1 g via INTRAMUSCULAR

## 2013-03-04 MED ORDER — CIPROFLOXACIN HCL 500 MG PO TABS: 500.0000 mg | ORAL_TABLET | Freq: Two times a day (BID) | ORAL | Status: DC

## 2013-03-04 NOTE — Progress Notes (Signed)
  Subjective:    Patient ID: Kristine [name], female    DOB: 11/30/1940, 72 y.o.   MRN: [id number]  HPI 2 days ago started with chills and fatigue. Then yesterday morning left work early with body aches, chills. No better today. Still c/o chills with Scratchy throat. Coughs up light yellow ph elm. Abdominal bloatting times 3 months appears to be constant.     Review of Systems     Objective:   Physical Exam        Assessment & Plan:

## 2013-03-04 NOTE — Progress Notes (Signed)
  Subjective:    Patient ID: Kristine [name], female    DOB: 09-Oct-1941, 72 y.o.   MRN: [id number]  HPI 3days of chills, fatigue and cloudy urine. No pain on urination. No fever.   Review of Systems     Objective:   Physical Exam  Vitals reviewed. Constitutional: She is oriented to person, place, and time. She appears well-developed and well-nourished. No distress.  Eyes: EOM are normal.  Cardiovascular: Normal rate.   Abdominal: Soft. She exhibits no mass. There is tenderness. There is no guarding.  Neurological: She is alert and oriented to person, place, and time. She exhibits normal muscle tone. Coordination normal.  Skin: No rash noted.  Psychiatric: She has a normal mood and affect.     Results for orders placed in visit on 03/04/13  POCT URINALYSIS DIPSTICK      Result Value Range   Color, UA yellow     Clarity, UA clear     Glucose, UA neg     Bilirubin, UA neg     Ketones, UA neg     Spec Grav, UA 1.015     Blood, UA neg     pH, UA 7.0     Protein, UA neg     Urobilinogen, UA 0.2     Nitrite, UA neg     Leukocytes, UA large (3+)    POCT UA - MICROSCOPIC ONLY      Result Value Range   WBC, Ur, HPF, POC 3-8     RBC, urine, microscopic 0-2     Bacteria, U Microscopic neg     Mucus, UA neg     Epithelial cells, urine per micros 1-4     Crystals, Ur, HPF, POC neg     Casts, Ur, LPF, POC neg     Yeast, UA neg     Renal tubular cells pos          Assessment & Plan:  UTI Rocephin 1g/Cipro 500mg  RTC 2days

## 2013-03-06 LAB — URINE CULTURE: Colony Count: 55000

## 2013-03-08 ENCOUNTER — Ambulatory Visit: Payer: PRIVATE HEALTH INSURANCE | Admitting: Gynecology

## 2013-03-21 ENCOUNTER — Encounter (HOSPITAL_BASED_OUTPATIENT_CLINIC_OR_DEPARTMENT_OTHER): Payer: Self-pay | Admitting: *Deleted

## 2013-03-21 ENCOUNTER — Emergency Department (HOSPITAL_BASED_OUTPATIENT_CLINIC_OR_DEPARTMENT_OTHER)
Admission: EM | Admit: 2013-03-21 | Discharge: 2013-03-21 | Disposition: A | Discharge status: Home / Self Care | Payer: PRIVATE HEALTH INSURANCE | Attending: Emergency Medicine | Admitting: Emergency Medicine

## 2013-03-21 DIAGNOSIS — Z8719 Personal history of other diseases of the digestive system: Secondary | ICD-10-CM | POA: Insufficient documentation

## 2013-03-21 DIAGNOSIS — Z8679 Personal history of other diseases of the circulatory system: Secondary | ICD-10-CM | POA: Insufficient documentation

## 2013-03-21 DIAGNOSIS — Z79899 Other long term (current) drug therapy: Secondary | ICD-10-CM | POA: Insufficient documentation

## 2013-03-21 DIAGNOSIS — M5410 Radiculopathy, site unspecified: Secondary | ICD-10-CM

## 2013-03-21 DIAGNOSIS — I10 Essential (primary) hypertension: Secondary | ICD-10-CM | POA: Insufficient documentation

## 2013-03-21 DIAGNOSIS — Z8739 Personal history of other diseases of the musculoskeletal system and connective tissue: Secondary | ICD-10-CM | POA: Insufficient documentation

## 2013-03-21 DIAGNOSIS — K2270 Barrett's esophagus without dysplasia: Secondary | ICD-10-CM | POA: Insufficient documentation

## 2013-03-21 DIAGNOSIS — IMO0002 Reserved for concepts with insufficient information to code with codable children: Secondary | ICD-10-CM | POA: Insufficient documentation

## 2013-03-21 DIAGNOSIS — K219 Gastro-esophageal reflux disease without esophagitis: Secondary | ICD-10-CM | POA: Insufficient documentation

## 2013-03-21 MED ORDER — HYDROMORPHONE HCL PF 1 MG/ML IJ SOLN
1.0000 mg | Freq: Once | INTRAMUSCULAR | Status: AC
  Administered 2013-03-21: 1 mg via INTRAMUSCULAR
  Filled 2013-03-21: qty 1

## 2013-03-21 MED ORDER — HYDROCODONE-ACETAMINOPHEN 5-325 MG PO TABS: 1.0000 | ORAL_TABLET | Freq: Three times a day (TID) | ORAL | Status: DC | PRN

## 2013-03-21 NOTE — ED Notes (Signed)
Pt states she feels "so much better" denies any pain. Cereal/juice given to pt. Report given to next shift.

## 2013-03-21 NOTE — ED Notes (Signed)
Pt aware that she will need to remain here for the next 2 hours per Dr. Bebe Shaggy until she is able to drive home after getting the Dilaudid IM injection.

## 2013-03-21 NOTE — ED Notes (Addendum)
Pt states that she has been doing a lot of dancing the past couple weeks. Also states that she slept on a hard couch Thursday and woke up Friday morning with pain.  C/o right lower buttocks pain that radiates into her lower leg. States pain is worse lying down and sitting. Pt states that she took a hydrocodone at 0030 and states she drove herself to the ED

## 2013-03-21 NOTE — ED Provider Notes (Addendum)
History     CSN: [id number]  Arrival date & time 03/21/13  0458   First MD Initiated Contact with Patient 03/21/13 [phone]      Chief Complaint  Patient presents with  . Leg Pain     Patient is a 72 y.o. female presenting with leg pain. The history is provided by the patient.  Leg Pain Location:  Buttock Injury: no   Buttock location:  R buttock Pain details:    Quality:  Aching and burning   Radiates to:  R leg   Severity:  Severe   Onset quality:  Gradual   Duration:  2 days   Timing:  Intermittent   Progression:  Worsening Chronicity:  Recurrent Relieved by: walking/standing. Exacerbated by: sitting. Ineffective treatments: vicodin. Associated symptoms: no fever and no muscle weakness   pt reports h/o "sciatica" with resultant pain in right buttock/leg She reports starting two days ago she had return of the pain in right buttock into her leg.  She reports increased activity recently due to ballroom dancing and also she had to sleep on the couch while hosting her grandchildren  No fever/chills.  No abd pain.  No dysuria No urinary/fecal incontinence reported No weakness in the leg Denies h/o back surgery  Past Medical History  Diagnosis Date  . GERD (gastroesophageal reflux disease)   . Barrett's esophagus     Stage B  . Cystocele   . Rectocele     mild  . Hypertension   . Diverticulitis   . Arthritis   . Raynaud disease   . CREST variant of scleroderma   . Osteopenia 09/2012    T score -2.4 FRAX 13%/2.7%  per 10/15/2012 discussion plan repeat in 2 years    Past Surgical History  Procedure Laterality Date  . Tonsillectomy  1950  . Tubal ligation  1980  . Nissen fundoplication  2006  . Total knee arthroplasty      right  . Joint replacement  9/10    rt total  . Foot surgery  2013    x2  . Abdominal hysterectomy  1996    RSO for menorrhagia.    Family History  Problem Relation Age of Onset  . Cancer Mother     Pancreatic Cancer  . Hepatitis C  Mother   . Heart disease Father   . Panic disorder Daughter   . Asthma Maternal Grandmother   . Cancer Maternal Grandfather     lung    History  Substance Use Topics  . Smoking status: Never Smoker   . Smokeless tobacco: Never Used  . Alcohol Use: No    OB History   Grav Para Term Preterm Abortions TAB SAB Ect Mult Living   1 1        1       Review of Systems  Constitutional: Negative for fever.  Respiratory: Negative for cough.   Cardiovascular: Negative for chest pain.  Gastrointestinal: Negative for abdominal pain.  Genitourinary: Negative for dysuria and difficulty urinating.  Neurological: Negative for weakness.  All other systems reviewed and are negative.    Allergies  Aspirin; Ibuprofen; Nsaids; and Statins  Home Medications   Current Outpatient Rx  Name  Route  Sig  Dispense  Refill  . ALPRAZolam (XANAX) 0.25 MG tablet   Oral   Take 1-2 tablets (0.25-0.5 mg total) by mouth every 6 (six) hours as needed for anxiety.   30 tablet   0   . ciprofloxacin (CIPRO) 500  MG tablet   Oral   Take 1 tablet (500 mg total) by mouth 2 (two) times daily.   20 tablet   0   . lisinopril (PRINIVIL,ZESTRIL) 10 MG tablet   Oral   Take 1 tablet (10 mg total) by mouth daily. NEEDS OFFICE VISIT FOR MORE   30 tablet   11     Rx has expired - no refills remain   . MULTIPLE VITAMINS PO   Oral   Take by mouth.           Marland Kitchen omeprazole (PRILOSEC) 20 MG capsule   Oral   Take 20 mg by mouth 2 (two) times daily.             BP 147/71  Pulse 66  Temp(Src) 97.3 F (36.3 C) (Oral)  SpO2 100%  LMP 11/25/1994  Physical Exam CONSTITUTIONAL: Well developed/well nourished HEAD: Normocephalic/atraumatic EYES: EOMI/PERRL ENMT: Mucous membranes moist NECK: supple no meningeal signs SPINE:entire spine nontender  CV: S1/S2 noted, no murmurs/rubs/gallops noted LUNGS: Lungs are clear to auscultation bilaterally, no apparent distress ABDOMEN: soft, nontender, no rebound  or guarding GU:no cva tenderness NEURO: Awake/alert, equal distal motor: hip flexion/knee flexion/extension, ankle dorsi/plantar flexion, great toe extension intact bilaterally, no clonus bilaterally, plantar reflex appropriate, no apparent sensory deficit in any dermatome.   Pt is able to ambulate. EXTREMITIES: pulses normal, full ROM She reports pain in her right buttock.  No bruising or erythema noted. She has full ROM of right hip.   SKIN: warm, color normal PSYCH: no abnormalities of mood noted   ED Course  Procedures  6:32 AM Pt reports recurrence of her "sciatica" pain in her right LE.  She reports long h/o this type of pain She has no focal neuro deficits.  She reports pain not relieved at home with oral pain meds She requests "pain shot" She drove herself to the ED She will need to be monitored for at least two hours before she can be released Pt is agreeable with this plan  MDM  Nursing notes including past medical history and social history reviewed and considered in documentation         Joya Gaskins, MD 03/21/13 7829  Joya Gaskins, MD 03/21/13 [phone]

## 2013-06-21 ENCOUNTER — Ambulatory Visit (HOSPITAL_COMMUNITY): Admit: 2013-06-21 | Payer: Self-pay | Admitting: Cardiovascular Disease

## 2013-06-21 ENCOUNTER — Encounter (HOSPITAL_COMMUNITY)
Admission: EM | Disposition: A | Discharge status: Home / Self Care | Payer: Self-pay | Source: Home / Self Care | Attending: Cardiovascular Disease

## 2013-06-21 ENCOUNTER — Inpatient Hospital Stay (HOSPITAL_BASED_OUTPATIENT_CLINIC_OR_DEPARTMENT_OTHER)
Admission: EM | Admit: 2013-06-21 | Discharge: 2013-06-24 | DRG: 247 | Disposition: A | Discharge status: Home / Self Care | Payer: PRIVATE HEALTH INSURANCE | Attending: Cardiovascular Disease | Admitting: Cardiovascular Disease

## 2013-06-21 ENCOUNTER — Encounter (HOSPITAL_BASED_OUTPATIENT_CLINIC_OR_DEPARTMENT_OTHER): Payer: Self-pay | Admitting: *Deleted

## 2013-06-21 ENCOUNTER — Other Ambulatory Visit: Payer: Self-pay

## 2013-06-21 ENCOUNTER — Ambulatory Visit (INDEPENDENT_AMBULATORY_CARE_PROVIDER_SITE_OTHER): Payer: PRIVATE HEALTH INSURANCE | Admitting: Family Medicine

## 2013-06-21 ENCOUNTER — Other Ambulatory Visit (HOSPITAL_BASED_OUTPATIENT_CLINIC_OR_DEPARTMENT_OTHER): Payer: PRIVATE HEALTH INSURANCE

## 2013-06-21 ENCOUNTER — Ambulatory Visit: Payer: PRIVATE HEALTH INSURANCE

## 2013-06-21 VITALS — BP 132/80 | HR 83 | Temp 98.0°F | Resp 16 | Ht 61.0 in | Wt 143.8 lb

## 2013-06-21 DIAGNOSIS — K5900 Constipation, unspecified: Secondary | ICD-10-CM | POA: Diagnosis present

## 2013-06-21 DIAGNOSIS — Z955 Presence of coronary angioplasty implant and graft: Secondary | ICD-10-CM

## 2013-06-21 DIAGNOSIS — I10 Essential (primary) hypertension: Secondary | ICD-10-CM | POA: Diagnosis present

## 2013-06-21 DIAGNOSIS — R142 Eructation: Secondary | ICD-10-CM

## 2013-06-21 DIAGNOSIS — K219 Gastro-esophageal reflux disease without esophagitis: Secondary | ICD-10-CM | POA: Diagnosis present

## 2013-06-21 DIAGNOSIS — R109 Unspecified abdominal pain: Secondary | ICD-10-CM

## 2013-06-21 DIAGNOSIS — M349 Systemic sclerosis, unspecified: Secondary | ICD-10-CM | POA: Diagnosis present

## 2013-06-21 DIAGNOSIS — Z7982 Long term (current) use of aspirin: Secondary | ICD-10-CM

## 2013-06-21 DIAGNOSIS — I252 Old myocardial infarction: Secondary | ICD-10-CM | POA: Diagnosis present

## 2013-06-21 DIAGNOSIS — Z96659 Presence of unspecified artificial knee joint: Secondary | ICD-10-CM

## 2013-06-21 DIAGNOSIS — Z79899 Other long term (current) drug therapy: Secondary | ICD-10-CM

## 2013-06-21 DIAGNOSIS — I213 ST elevation (STEMI) myocardial infarction of unspecified site: Secondary | ICD-10-CM

## 2013-06-21 DIAGNOSIS — I2109 ST elevation (STEMI) myocardial infarction involving other coronary artery of anterior wall: Principal | ICD-10-CM | POA: Diagnosis present

## 2013-06-21 DIAGNOSIS — K2270 Barrett's esophagus without dysplasia: Secondary | ICD-10-CM | POA: Diagnosis present

## 2013-06-21 DIAGNOSIS — R141 Gas pain: Secondary | ICD-10-CM

## 2013-06-21 DIAGNOSIS — R140 Abdominal distension (gaseous): Secondary | ICD-10-CM

## 2013-06-21 DIAGNOSIS — I2510 Atherosclerotic heart disease of native coronary artery without angina pectoris: Secondary | ICD-10-CM

## 2013-06-21 DIAGNOSIS — I2129 ST elevation (STEMI) myocardial infarction involving other sites: Secondary | ICD-10-CM

## 2013-06-21 DIAGNOSIS — E785 Hyperlipidemia, unspecified: Secondary | ICD-10-CM | POA: Diagnosis present

## 2013-06-21 HISTORY — PX: CORONARY ANGIOPLASTY WITH STENT PLACEMENT: SHX49

## 2013-06-21 LAB — COMPREHENSIVE METABOLIC PANEL
AST: 16 U/L (ref 0–37)
AST: 20 U/L (ref 0–37)
Alkaline Phosphatase: 99 U/L (ref 39–117)
Alkaline Phosphatase: 123 U/L — ABNORMAL HIGH (ref 39–117)
BUN: 14 mg/dL (ref 6–23)
BUN: 14 mg/dL (ref 6–23)
CO2: 28 mEq/L (ref 19–32)
Chloride: 100 mEq/L (ref 96–112)
Creat: 0.77 mg/dL (ref 0.50–1.10)
Creatinine, Ser: 0.8 mg/dL (ref 0.50–1.10)
GFR calc non Af Amer: 73 mL/min — ABNORMAL LOW (ref >90)
Potassium: 3.7 mEq/L (ref 3.5–5.1)
Total Bilirubin: 0.3 mg/dL (ref 0.3–1.2)
Total Bilirubin: 0.2 mg/dL — ABNORMAL LOW (ref 0.3–1.2)

## 2013-06-21 LAB — TROPONIN I: Troponin I: <0.3 ng/mL (ref <0.30)

## 2013-06-21 LAB — POCT I-STAT TROPONIN I: Troponin i, poc: 0.01 ng/mL (ref 0.00–0.08)

## 2013-06-21 LAB — CBC
HCT: 41.2 % (ref 36.0–46.0)
MCV: 87.3 fL (ref 78.0–100.0)
RBC: 4.72 MIL/uL (ref 3.87–5.11)
WBC: 13.4 10*3/uL — ABNORMAL HIGH (ref 4.0–10.5)

## 2013-06-21 LAB — POCT CBC
MCH, POC: 27.6 pg (ref 27–31.2)
MCHC: 30.7 g/dL — AB (ref 31.8–35.4)
MID (cbc): 0.8 (ref 0–0.9)
MPV: 9.3 fL (ref 0–99.8)
POC LYMPH PERCENT: 25 %L (ref 10–50)
POC MID %: 8.3 %M (ref 0–12)
Platelet Count, POC: 197 10*3/uL (ref 142–424)
RBC: 4.74 M/uL (ref 4.04–5.48)
RDW, POC: 16.1 %
WBC: 9.2 10*3/uL (ref 4.6–10.2)

## 2013-06-21 SURGERY — LEFT HEART CATHETERIZATION WITH CORONARY ANGIOGRAM
Anesthesia: LOCAL | Laterality: Bilateral

## 2013-06-21 MED ORDER — NITROGLYCERIN IN D5W 200-5 MCG/ML-% IV SOLN
INTRAVENOUS | Status: AC
  Filled 2013-06-21: qty 250

## 2013-06-21 MED ORDER — HEPARIN (PORCINE) IN NACL 100-0.45 UNIT/ML-% IJ SOLN
1000.0000 [IU]/h | INTRAMUSCULAR | Status: DC
  Administered 2013-06-21: 1000 [IU]/h via INTRAVENOUS

## 2013-06-21 MED ORDER — FENTANYL CITRATE 0.05 MG/ML IJ SOLN
INTRAMUSCULAR | Status: AC
  Filled 2013-06-21: qty 2

## 2013-06-21 MED ORDER — NITROGLYCERIN 0.2 MG/ML ON CALL CATH LAB
INTRAVENOUS | Status: AC
  Filled 2013-06-21: qty 1

## 2013-06-21 MED ORDER — HEPARIN SODIUM (PORCINE) 5000 UNIT/ML IJ SOLN: 60.0000 [IU]/kg | Freq: Once | INTRAMUSCULAR | Status: DC

## 2013-06-21 MED ORDER — ASPIRIN 81 MG PO CHEW
CHEWABLE_TABLET | ORAL | Status: AC
  Filled 2013-06-21: qty 4

## 2013-06-21 MED ORDER — MORPHINE SULFATE 4 MG/ML IJ SOLN
INTRAMUSCULAR | Status: AC
  Filled 2013-06-21: qty 1

## 2013-06-21 MED ORDER — ONDANSETRON HCL 4 MG/2ML IJ SOLN
4.0000 mg | Freq: Once | INTRAMUSCULAR | Status: AC
  Administered 2013-06-21: 4 mg via INTRAVENOUS

## 2013-06-21 MED ORDER — LISINOPRIL 10 MG PO TABS: 10.0000 mg | ORAL_TABLET | Freq: Every day | ORAL | Status: DC

## 2013-06-21 MED ORDER — BIVALIRUDIN 250 MG IV SOLR
INTRAVENOUS | Status: AC
  Filled 2013-06-21: qty 250

## 2013-06-21 MED ORDER — MORPHINE SULFATE 4 MG/ML IJ SOLN
4.0000 mg | Freq: Once | INTRAMUSCULAR | Status: AC
  Administered 2013-06-21: 4 mg via INTRAVENOUS

## 2013-06-21 MED ORDER — ONDANSETRON HCL 4 MG/2ML IJ SOLN
INTRAMUSCULAR | Status: AC
  Filled 2013-06-21: qty 2

## 2013-06-21 MED ORDER — HEPARIN (PORCINE) IN NACL 100-0.45 UNIT/ML-% IJ SOLN
INTRAMUSCULAR | Status: AC
  Filled 2013-06-21: qty 250

## 2013-06-21 MED ORDER — MIDAZOLAM HCL 2 MG/2ML IJ SOLN
INTRAMUSCULAR | Status: AC
  Filled 2013-06-21: qty 2

## 2013-06-21 MED ORDER — LIDOCAINE HCL (PF) 1 % IJ SOLN
INTRAMUSCULAR | Status: AC
  Filled 2013-06-21: qty 30

## 2013-06-21 MED ORDER — ASPIRIN 81 MG PO CHEW
324.0000 mg | CHEWABLE_TABLET | Freq: Once | ORAL | Status: AC
  Administered 2013-06-21: 324 mg via ORAL

## 2013-06-21 MED ORDER — TICAGRELOR 90 MG PO TABS
ORAL_TABLET | ORAL | Status: AC
  Filled 2013-06-21: qty 2

## 2013-06-21 MED ORDER — HEPARIN (PORCINE) IN NACL 2-0.9 UNIT/ML-% IJ SOLN
INTRAMUSCULAR | Status: AC
  Filled 2013-06-21: qty 1000

## 2013-06-21 MED ORDER — NITROGLYCERIN IN D5W 200-5 MCG/ML-% IV SOLN
5.0000 ug/min | INTRAVENOUS | Status: DC
  Administered 2013-06-21: 10 ug/min via INTRAVENOUS

## 2013-06-21 NOTE — Progress Notes (Addendum)
[facility] [address] [place] [address] [phone]  Date:  06/21/2013   Name:  Kristine [name]   DOB:  [birth year]   MRN:  [id number]  PCP:  Lucilla Edin, MD    Chief Complaint: Gas and Constipation   History of Present Illness:  Kristine [name] is a 72 y.o. very pleasant female patient who presents with the following:  Today is Monday.  She has noted "the worst gas" since Friday.  "It's constant gas."  She is taking OTC simethicone but it does not help that much. This morning she noted a lot of gas and the urge to burp.  She noted a "gurgling" in her throat this mornign as well.   This worried her so she came in for evaluation.  No CP, no SOB She is having abdominal pains associated with her bloating that will come and go.  No persistent abdominal pain. She did have a BM this morning.  She had felt constipated last week.  She is not having diarrhea now- she is having frequent but normal consistetnly stools.  No vomiting, she is still able to eat but is trying to watch her diet and avoid gassy foods She has not noted a fever.   She has had some gas in the past, but never this persistent.    She does have a history of diverticulitis and GERD.  She also has barrett's esophagus.  Her GI is at [facility].   She has not noted any blood in her stool.  No urinary sx except she does have to urinate at night- this has occurred since she started drinking tea to help herself sleep at night.    Patient Active Problem List   Diagnosis Date Noted  . Diverticulitis   . GERD (gastroesophageal reflux disease)   . Barrett's esophagus   . Cystocele   . Rectocele     Past Medical History  Diagnosis Date  . GERD (gastroesophageal reflux disease)   . Barrett's esophagus     Stage B  . Cystocele   . Rectocele     mild  . Hypertension   . Diverticulitis   . Arthritis   . Raynaud disease   . CREST variant of scleroderma   . Osteopenia 09/2012    T score -2.4  FRAX 13%/2.7%  per 10/15/2012 discussion plan repeat in 2 years  . Anemia     Past Surgical History  Procedure Laterality Date  . Tonsillectomy  1950  . Tubal ligation  1980  . Nissen fundoplication  2006  . Total knee arthroplasty      right  . Joint replacement  9/10    rt total  . Foot surgery  2013    x2  . Abdominal hysterectomy  1996    RSO for menorrhagia.    History  Substance Use Topics  . Smoking status: Never Smoker   . Smokeless tobacco: Never Used  . Alcohol Use: No    Family History  Problem Relation Age of Onset  . Cancer Mother     Pancreatic Cancer  . Hepatitis C Mother   . Heart disease Father     congestive heart failure  . Panic disorder Daughter   . Asthma Maternal Grandmother   . Emphysema Maternal Grandmother   . Cancer Maternal Grandfather     lung    Allergies  Allergen Reactions  . Aspirin   . Ibuprofen Nausea Only  . Nsaids  GERD hx  . Statins     ELEVATED LFT'S    Medication list has been reviewed and updated.  Current Outpatient Prescriptions on File Prior to Visit  Medication Sig Dispense Refill  . lisinopril (PRINIVIL,ZESTRIL) 10 MG tablet Take 1 tablet (10 mg total) by mouth daily. NEEDS OFFICE VISIT FOR MORE  30 tablet  11  . MULTIPLE VITAMINS PO Take by mouth.        Marland Kitchen omeprazole (PRILOSEC) 20 MG capsule Take 20 mg by mouth 2 (two) times daily.        Marland Kitchen ALPRAZolam (XANAX) 0.25 MG tablet Take 1-2 tablets (0.25-0.5 mg total) by mouth every 6 (six) hours as needed for anxiety.  30 tablet  0  . HYDROcodone-acetaminophen (NORCO/VICODIN) 5-325 MG per tablet Take 1 tablet by mouth every 8 (eight) hours as needed for pain.  15 tablet  0   No current facility-administered medications on file prior to visit.    Review of Systems:  As per HPI- otherwise negative.   Physical Examination: Filed Vitals:   06/21/13 0817  BP: 156/74  Pulse: 83  Temp: 98 F (36.7 C)  Resp: 16   Filed Vitals:   06/21/13 0817  Height: 5'  1" (1.549 m)  Weight: 143 lb 12.8 oz (65.227 kg)   Body mass index is 27.18 kg/(m^2). Ideal Body Weight: Weight in (lb) to have BMI = 25: 132  GEN: WDWN, NAD, Non-toxic, A & O x 3, looks well HEENT: Atraumatic, Normocephalic. Neck supple. No masses, No LAD.  Bilateral TM wnl, oropharynx normal.  PEERL,EOMI.   Ears and Nose: No external deformity. CV: RRR, No M/G/R. No JVD. No thrill. No extra heart sounds. PULM: CTA B, no wheezes, crackles, rhonchi. No retractions. No resp. distress. No accessory muscle use. ABD: S, NT, ND, +BS. No rebound. No HSM.  Benign exam EXTR: No c/c/e NEURO Normal gait.  PSYCH: Normally interactive. Conversant. Not depressed or anxious appearing.  Calm demeanor.   [facility] reading (PRIMARY) by  Dr. Patsy Lager. Abdominal series: non specific bowel gas pattern **ADDENDUM** CREATED: 06/21/2013 12:26:54  Radiographs were resubmitted with upright and supine annotations on the abdominal radiographs. No abnormal air-fluid level. Report is otherwise correct as originally dictated.  **END ADDENDUM** SIGNED BY: Harrel Lemon, M.D.     Narrative        *RADIOLOGY REPORT*  Clinical Data: Abdominal pain and bloating  ACUTE ABDOMEN SERIES (ABDOMEN 2 VIEW & CHEST 1 VIEW)  Comparison: None.  Findings: Heart size is normal. No free air beneath the diaphragms. Lungs are grossly clear. No pleural effusion.  Dextroscoliosis of the lumbar spine centered at L2 noted. Normal bowel gas pattern allowing for presumed supine technique, although this is not indicated on the film. Presence or absence of air fluid levels or free air cannot be assessed on this single supine view. No abnormal calcific opacity. Probable bowel content or costochondral junction calcification noted in the left upper quadrant.  IMPRESSION: No acute cardiopulmonary process. Normal bowel gas pattern.  Clinically significant discrepancy from primary report, if provided: None         Results  for orders placed in visit on 06/21/13  POCT CBC      Result Value Range   WBC 9.2  4.6 - 10.2 K/uL   Lymph, poc 2.3  0.6 - 3.4   POC LYMPH PERCENT 25.0  10 - 50 %L   MID (cbc) 0.8  0 - 0.9   POC MID % 8.3  0 - 12 %M   POC Granulocyte 6.1  2 - 6.9   Granulocyte percent 66.7  37 - 80 %G   RBC 4.74  4.04 - 5.48 M/uL   Hemoglobin 13.1  12.2 - 16.2 g/dL   HCT, POC 16.1  09.6 - 47.9 %   MCV 90.1  80 - 97 fL   MCH, POC 27.6  27 - 31.2 pg   MCHC 30.7 (*) 31.8 - 35.4 g/dL   RDW, POC 04.5     Platelet Count, POC 197  142 - 424 K/uL   MPV 9.3  0 - 99.8 fL  GLUCOSE, POCT (MANUAL RESULT ENTRY)      Result Value Range   POC Glucose 84  70 - 99 mg/dl  IFOBT (OCCULT BLOOD)      Result Value Range   IFOBT Negative       Assessment and Plan: Bloating - Plan: DG Abd Acute W/Chest  Abdominal discomfort - Plan: POCT CBC, POCT glucose (manual entry), Comprehensive metabolic panel, IFOBT POC (occult bld, rslt in office)  Hypertension - Plan: lisinopril (PRINIVIL,ZESTRIL) 10 MG tablet  Suspect physiologic gas.  At this time her exam and labs are reassuring.  Await CMP.  Refilled her linisopril.  She does drink a lot of mild- a gallon will last her 3 days.  She may have lactose intolerance.  Try using lactaid and otherwise avoiding daily products.  She will let me know if not better in the next couple of days-  Sooner if worse.   Signed Abbe Amsterdam, MD  7/29- called her to go over her CMP.   Results for orders placed in visit on 06/21/13  COMPREHENSIVE METABOLIC PANEL      Result Value Range   Sodium 140  135 - 145 mEq/L   Potassium 4.4  3.5 - 5.3 mEq/L   Chloride 103  96 - 112 mEq/L   CO2 28  19 - 32 mEq/L   Glucose, Bld 99  70 - 99 mg/dL   BUN 14  6 - 23 mg/dL   Creat 4.09  8.11 - 9.14 mg/dL   Total Bilirubin 0.3  0.3 - 1.2 mg/dL   Alkaline Phosphatase 99  39 - 117 U/L   AST 16  0 - 37 U/L   ALT 20  0 - 35 U/L   Total Protein 6.3  6.0 - 8.3 g/dL   Albumin 4.1  3.5 - 5.2 g/dL    Calcium 9.6  8.4 - 78.2 mg/dL  POCT CBC      Result Value Range   WBC 9.2  4.6 - 10.2 K/uL   Lymph, poc 2.3  0.6 - 3.4   POC LYMPH PERCENT 25.0  10 - 50 %L   MID (cbc) 0.8  0 - 0.9   POC MID % 8.3  0 - 12 %M   POC Granulocyte 6.1  2 - 6.9   Granulocyte percent 66.7  37 - 80 %G   RBC 4.74  4.04 - 5.48 M/uL   Hemoglobin 13.1  12.2 - 16.2 g/dL   HCT, POC 95.6  21.3 - 47.9 %   MCV 90.1  80 - 97 fL   MCH, POC 27.6  27 - 31.2 pg   MCHC 30.7 (*) 31.8 - 35.4 g/dL   RDW, POC 08.6     Platelet Count, POC 197  142 - 424 K/uL   MPV 9.3  0 - 99.8 fL  GLUCOSE, POCT (MANUAL RESULT ENTRY)  Result Value Range   POC Glucose 84  70 - 99 mg/dl  IFOBT (OCCULT BLOOD)      Result Value Range   IFOBT Negative     LMOM that her labs look fine.  Let us know if not feeling better.   06/28/13- called and discussed with her- she did have an MI, had a cath/ stent and is now doing better. She actually went back to the hospital on the 2nd and spend the night again, but is released home again.  She is doing ok.  Let her know that her other labs look ok. If there is anything we can do to help please let us know

## 2013-06-21 NOTE — ED Provider Notes (Signed)
CSN: [id number]     Arrival date & time 06/21/13  2146 History     First MD Initiated Contact with Patient 06/21/13 2156     Chief Complaint  Patient presents with  . Chest Pain   (Consider location/radiation/quality/duration/timing/severity/associated sxs/prior Treatment) HPI A LEVEL 5 CAVEAT PERTAINS DUE TO URGENT NEED FOR INTERVENTION Pt presents with c/o chest pain.  She states she had acute onset of chest pain while sitting and watching TV- [employer]-  She had been having some feelings of increased belching and nausea this morning and was seen by her PMD.  Tonight she began to have chest pressure and pain in the center of her chest that she rates as 10/10.    Past Medical History  Diagnosis Date  . GERD (gastroesophageal reflux disease)   . Barrett's esophagus     Stage B  . Cystocele   . Rectocele     mild  . Hypertension   . Diverticulitis   . Arthritis   . Raynaud disease   . CREST variant of scleroderma   . Osteopenia 09/2012    T score -2.4 FRAX 13%/2.7%  per 10/15/2012 discussion plan repeat in 2 years  . Anemia    Past Surgical History  Procedure Laterality Date  . Tonsillectomy  1950  . Tubal ligation  1980  . Nissen fundoplication  2006  . Total knee arthroplasty      right  . Joint replacement  9/10    rt total  . Foot surgery  2013    x2  . Abdominal hysterectomy  1996    RSO for menorrhagia.   Family History  Problem Relation Age of Onset  . Cancer Mother     Pancreatic Cancer  . Hepatitis C Mother   . Heart disease Father     congestive heart failure  . Panic disorder Daughter   . Asthma Maternal Grandmother   . Emphysema Maternal Grandmother   . Cancer Maternal Grandfather     lung   History  Substance Use Topics  . Smoking status: Never Smoker   . Smokeless tobacco: Never Used  . Alcohol Use: No   OB History   Grav Para Term Preterm Abortions TAB SAB Ect Mult Living   1 1        1      Review of Systems UNABLE TO OBTAIN  ROS DUE TO LEVEL 5 CAVEAT  Allergies  Aspirin; Nsaids; and Statins  Home Medications   No current outpatient prescriptions on file. BP 120/62  Pulse 62  Temp(Src) 98.7 F (37.1 C) (Oral)  Resp 14  Ht 5\' 1"  (1.549 m)  Wt 147 lb 0.8 oz (66.7 kg)  BMI 27.8 kg/m2  SpO2 95%  LMP 11/25/1994 Vitals reviewed Physical Exam Physical Examination: General appearance - alert, well appearing, and in no distress Mental status - alert, oriented to person, place, and time Eyes - no scleral icterus, no conjunctival injection Mouth - mucous membranes moist, pharynx normal without lesions Chest - clear to auscultation, no wheezes, rales or rhonchi, symmetric air entry Heart - normal rate, regular rhythm, normal S1, S2, no murmurs, rubs, clicks or gallops Abdomen - soft, nontender, nondistended, no masses or organomegaly Extremities - peripheral pulses normal, no pedal edema, no clubbing or cyanosis Skin - normal coloration and turgor, no rashes, warm and dry  ED Course   Procedures (including critical care time)   Date: 06/21/2013  Rate: 94  Rhythm: sinus arrhythmia  QRS Axis: left  axis deviation  Intervals: normal  ST/T Wave abnormalities: ST elevations laterally  Conduction Disutrbances:incomplete right bundle branch block  Narrative Interpretation:   Old EKG Reviewed: changes noted , stemi is new compared to prior ekg of 11/20/11   10:11 PM pt has been started on nitro, heparin, chewed aspirin.  Chest pain still 8/10- was 9/10.  Has also been given morphine.  EMS is here to transport patient,.  Code Stemi called on arrival.  D/w Dr. Excell Seltzer cardiology and patient to be transported the cath lab at [facility].    CRITICAL CARE Performed by: Ethelda Chick Total critical care time: 35 Critical care time was exclusive of separately billable procedures and treating other patients. Critical care was necessary to treat or prevent imminent or life-threatening deterioration. Critical care was  time spent personally by me on the following activities: development of treatment plan with patient and/or surrogate as well as nursing, discussions with consultants, evaluation of patient's response to treatment, examination of patient, obtaining history from patient or surrogate, ordering and performing treatments and interventions, ordering and review of laboratory studies, ordering and review of radiographic studies, pulse oximetry and re-evaluation of patient's condition.  Labs Reviewed  CBC - Abnormal; Notable for the following:    WBC 13.4 (*)    All other components within normal limits  COMPREHENSIVE METABOLIC PANEL - Abnormal; Notable for the following:    Glucose, Bld 119 (*)    Alkaline Phosphatase 123 (*)    Total Bilirubin 0.2 (*)    GFR calc non Af Amer 73 (*)    GFR calc Af Amer 85 (*)    All other components within normal limits  CBC - Abnormal; Notable for the following:    WBC 11.6 (*)    All other components within normal limits  BASIC METABOLIC PANEL - Abnormal; Notable for the following:    Glucose, Bld 128 (*)    GFR calc non Af Amer 87 (*)    All other components within normal limits  TROPONIN I - Abnormal; Notable for the following:    Troponin I 1.34 (*)    All other components within normal limits  TROPONIN I - Abnormal; Notable for the following:    Troponin I 11.25 (*)    All other components within normal limits  LIPID PANEL - Abnormal; Notable for the following:    Cholesterol 234 (*)    LDL Cholesterol 163 (*)    All other components within normal limits  TROPONIN I - Abnormal; Notable for the following:    Troponin I 3.90 (*)    All other components within normal limits  MRSA PCR SCREENING  TROPONIN I  POCT I-STAT TROPONIN I   Dg Abd Acute W/chest  06/21/2013   **ADDENDUM** CREATED: 06/21/2013 12:26:54  Radiographs were resubmitted with upright and supine annotations on the abdominal radiographs.  No abnormal air-fluid level.  Report is otherwise  correct as originally dictated.  **END ADDENDUM** SIGNED BY: Harrel Lemon, M.D.  06/21/2013   *RADIOLOGY REPORT*  Clinical Data: Abdominal pain and bloating  ACUTE ABDOMEN SERIES (ABDOMEN 2 VIEW & CHEST 1 VIEW)  Comparison: None.  Findings: Heart size is normal.  No free air beneath the diaphragms.  Lungs are grossly clear.  No pleural effusion.  Dextroscoliosis of the lumbar spine centered at L2 noted.  Normal bowel gas pattern allowing for presumed supine technique, although this is not indicated on the film. Presence or absence of air fluid levels or free air cannot  be assessed on this single supine view. No abnormal calcific opacity.  Probable bowel content or costochondral junction calcification noted in the left upper quadrant.  IMPRESSION: No acute cardiopulmonary process.  Normal bowel gas pattern.  Clinically significant discrepancy from primary report, if provided: None   Original Report Authenticated By: Christiana Pellant, M.D.   1. STEMI (ST elevation myocardial infarction)     MDM  Pt brought back emeregently from triage, EKG shows lateral Stemi, place on monitor, IV access obtained.  Pt given 324 chewable ASA, heparin and nitro started.  Pt also given zofran for nausea/vomiting.  Also given morphine for pain.  Placed on [place] O2.  EMS called for emergent transport to [facility] to cath lab.  D/w Dr. Excell Seltzer, cardiology.  Pt transferred in gaurded condition.      Ethelda Chick, MD 06/22/13 1725

## 2013-06-21 NOTE — Patient Instructions (Addendum)
Let me know if you are not feeling better in the next couple of days. Try some lactaid milk, and avoid other dairy products for a couple of days to see if dairy could be the problem.     Bloating Bloating is the feeling of fullness in your belly. You may feel as though your pants are too tight. Often the cause of bloating is overeating, retaining fluids, or having gas in your bowel. It is also caused by swallowing air and eating foods that cause gas. Irritable bowel syndrome is one of the most common causes of bloating. Constipation is also a common cause. Sometimes more serious problems can cause bloating. SYMPTOMS  Usually there is a feeling of fullness, as though your abdomen is bulged out. There may be mild discomfort.  DIAGNOSIS  Usually no particular testing is necessary for most bloating. If the condition persists and seems to become worse, your caregiver may do additional testing.  TREATMENT   There is no direct treatment for bloating.  Do not put gas into the bowel. Avoid chewing gum and sucking on candy. These tend to make you swallow air. Swallowing air can also be a nervous habit. Try to avoid this.  Avoiding high residue diets will help. Eat foods with soluble fibers (examples include root vegetables, apples, or barley) and substitute dairy products with soy and rice products. This helps irritable bowel syndrome.  If constipation is the cause, then a high residue diet with more fiber will help.  Avoid carbonated beverages.  Over-the-counter preparations are available that help reduce gas. Your pharmacist can help you with this. SEEK MEDICAL CARE IF:   Bloating continues and seems to be getting worse.  You notice a weight gain.  You have a weight loss but the bloating is getting worse.  You have changes in your bowel habits or develop nausea or vomiting. SEEK IMMEDIATE MEDICAL CARE IF:   You develop shortness of breath or swelling in your legs.  You have an increase in  abdominal pain or develop chest pain. Document Released: 09/11/2006 Document Revised: 02/03/2012 Document Reviewed: 10/30/2007 [facility] Patient Information 2014 [place], [place].

## 2013-06-21 NOTE — ED Notes (Signed)
[facility] at bedside

## 2013-06-21 NOTE — H&P (Signed)
History and Physical  Patient ID: Kristine [name] MRN: [id number], SOB: 03-05-1941 72 y.o. Date of Encounter: 06/21/2013, 10:49 PM  Primary Physician: Lucilla Edin, MD Primary Cardiologist: new  Chief Complaint: Chest Pain  HPI: 72 y.o. female w/ PMHx significant for Scleroderma/CREST and HTN who presented to [facility] on 06/21/2013 with complaints of Chest pain. She developed substernal chest pressure tonight at 9:20 pm and had her grandson drive her to [facility]. She was diagnosed with a STEMI and transported emergently to [facility] for cath and possible PCI. Has had a lot of 'gas' the past 3 days. No dyspnea, edema, or palpitions. No past history of CAD or MI. No history of smoking. Father died of an MI in his 47's. Currently chest pain rated at 4/10. Pt had nausea and vomiting while at [facility].   Past Medical History  Diagnosis Date  . GERD (gastroesophageal reflux disease)   . Barrett's esophagus     Stage B  . Cystocele   . Rectocele     mild  . Hypertension   . Diverticulitis   . Arthritis   . Raynaud disease   . CREST variant of scleroderma   . Osteopenia 09/2012    T score -2.4 FRAX 13%/2.7%  per 10/15/2012 discussion plan repeat in 2 years  . Anemia      Surgical History:  Past Surgical History  Procedure Laterality Date  . Tonsillectomy  1950  . Tubal ligation  1980  . Nissen fundoplication  2006  . Total knee arthroplasty      right  . Joint replacement  9/10    rt total  . Foot surgery  2013    x2  . Abdominal hysterectomy  1996    RSO for menorrhagia.     Home Meds: Prior to Admission medications   Medication Sig Start Date End Date Taking? Authorizing Provider  ALPRAZolam (XANAX) 0.25 MG tablet Take 1-2 tablets (0.25-0.5 mg total) by mouth every 6 (six) hours as needed for anxiety. 02/05/13   Heather Jaquita Rector, PA-C  HYDROcodone-acetaminophen (NORCO/VICODIN) 5-325 MG per tablet Take 1 tablet by mouth every 8 (eight) hours as  needed for pain. 03/21/13   Joya Gaskins, MD  lisinopril (PRINIVIL,ZESTRIL) 10 MG tablet Take 1 tablet (10 mg total) by mouth daily. 06/21/13   Pearline Cables, MD  MULTIPLE VITAMINS PO Take by mouth.      Historical Provider, MD  omeprazole (PRILOSEC) 20 MG capsule Take 20 mg by mouth 2 (two) times daily.      Historical Provider, MD    Allergies:  Allergies  Allergen Reactions  . Aspirin   . Ibuprofen Nausea Only  . Nsaids     GERD hx  . Statins     ELEVATED LFT'S    History   Social History  . Marital Status: Divorced    Spouse Name: N/A    Number of Children: N/A  . Years of Education: N/A   Occupational History  . Not on file.   Social History Main Topics  . Smoking status: Never Smoker   . Smokeless tobacco: Never Used  . Alcohol Use: No  . Drug Use: No  . Sexually Active: No   Other Topics Concern  . Not on file   Social History Narrative   Exercise dancing 3 x times weekly for 1-2 hours     Family History  Problem Relation Age of Onset  . Cancer Mother  Pancreatic Cancer  . Hepatitis C Mother   . Heart disease Father     congestive heart failure  . Panic disorder Daughter   . Asthma Maternal Grandmother   . Emphysema Maternal Grandmother   . Cancer Maternal Grandfather     lung    Review of Systems: General: negative for chills, fever, night sweats or weight changes.  ENT: negative for rhinorrhea or epistaxis Cardiovascular: see HPI  Dermatological: negative for rash Respiratory: negative for cough or wheezing GI: negative for nausea, vomiting, diarrhea, bright red blood per rectum, melena, or hematemesis GU: no hematuria, urgency, or frequency Neurologic: negative for visual changes, syncope, headache. Positive for dizziness. Heme: no easy bruising or bleeding Endo: negative for excessive thirst, thyroid disorder, or flushing Musculoskeletal: negative for joint pain or swelling, negative for myalgias All other systems reviewed and  are otherwise negative except as noted above.  Physical Exam: Blood pressure 165/93, pulse 85, resp. rate 16, height 5\' 1"  (1.549 m), weight 64.864 kg (143 lb), last menstrual period 11/25/1994, SpO2 98.00%. General: Well developed, well nourished, alert and oriented, in no acute distress. HEENT: Normocephalic, atraumatic, sclera non-icteric, no xanthomas, nares are without discharge.  Neck: Supple. Carotids 2+ without bruits. JVP normal Lungs: Clear bilaterally to auscultation without wheezes, rales, or rhonchi. Breathing is unlabored. Heart: RRR with normal S1 and S2. No murmurs, rubs, or gallops appreciated. Abdomen: Soft, non-tender, non-distended with normoactive bowel sounds. No hepatomegaly. No rebound/guarding. No obvious abdominal masses. Back: No CVA tenderness Msk:  Strength and tone appear normal for age. Extremities: No clubbing, cyanosis, or edema.  Distal pedal pulses are 2+ and equal bilaterally. Neuro: CNII-XII intact, moves all extremities spontaneously. Psych:  Responds to questions appropriately with a normal affect.   Labs:   Lab Results  Component Value Date   WBC 13.4* 06/21/2013   HGB 13.3 06/21/2013   HCT 41.2 06/21/2013   MCV 87.3 06/21/2013   PLT 225 06/21/2013    Recent Labs Lab 06/21/13 2155  NA 141  K 3.7  CL 100  CO2 28  BUN 14  CREATININE 0.80  CALCIUM 10.3  PROT 7.1  BILITOT 0.2*  ALKPHOS 123*  ALT 23  AST 20  GLUCOSE 119*    Recent Labs  06/21/13 2155  TROPONINI <0.30   No results found for this basename: CHOL, HDL, LDLCALC, TRIG   Lab Results  Component Value Date   DDIMER  Value: 0.41        AT THE INHOUSE ESTABLISHED CUTOFF VALUE OF 0.48 ug/mL FEU, THIS ASSAY HAS BEEN DOCUMENTED IN THE LITERATURE TO HAVE 09/03/2007    Radiology/Studies:  Dg Abd Acute W/chest  06/21/2013   **ADDENDUM** CREATED: 06/21/2013 12:26:54  Radiographs were resubmitted with upright and supine annotations on the abdominal radiographs.  No abnormal air-fluid  level.  Report is otherwise correct as originally dictated.  **END ADDENDUM** SIGNED BY: Harrel Lemon, M.D.  06/21/2013   *RADIOLOGY REPORT*  Clinical Data: Abdominal pain and bloating  ACUTE ABDOMEN SERIES (ABDOMEN 2 VIEW & CHEST 1 VIEW)  Comparison: None.  Findings: Heart size is normal.  No free air beneath the diaphragms.  Lungs are grossly clear.  No pleural effusion.  Dextroscoliosis of the lumbar spine centered at L2 noted.  Normal bowel gas pattern allowing for presumed supine technique, although this is not indicated on the film. Presence or absence of air fluid levels or free air cannot be assessed on this single supine view. No abnormal calcific opacity.  Probable bowel content or costochondral junction calcification noted in the left upper quadrant.  IMPRESSION: No acute cardiopulmonary process.  Normal bowel gas pattern.  Clinically significant discrepancy from primary report, if provided: None   Original Report Authenticated By: Christiana Pellant, M.D.     EKG: NSR with lateral ST elevation consistent with acute lateral wall STEMI  ASSESSMENT AND PLAN:  1. Acute Lateral STEMI. Has received heparin, NTG, ASA. Here for cardiac cath. Emergent implied consent obtained. Explained procedure to her. Plan possible PCI if appropriate.  2. HTN - continue ACE-I. May need to avoid beta-blocker with Raynaud's/CREST.  3. Hyperlipidemia - states cannot take statin because of hx elevated LFT's on these drugs.   Further plans pending cath results.  Enzo Bi  06/21/2013, 10:49 PM

## 2013-06-21 NOTE — ED Notes (Signed)
Pt c/o cp  " pressure" x 20 mins n/v SOB

## 2013-06-21 NOTE — CV Procedure (Signed)
  Cardiac Catheterization Procedure Note  Name: Kristine [name] MRN: [id number] DOB: 07-09-1941  Procedure: Left Heart Cath, Selective Coronary Angiography, LV angiography,  PTCA/Stent of the first OM  Indication: Acute Lateral Wall STEMI   Diagnostic Procedure Details: The right groin was prepped, draped, and anesthetized with 1% lidocaine. Using the modified Seldinger technique, a 6 French sheath was introduced into the right femoral artery. Standard Judkins catheters were used for selective coronary angiography and left ventriculography. Catheter exchanges were performed over a wire.  The diagnostic procedure was well-tolerated without immediate complications.  PROCEDURAL FINDINGS Hemodynamics: AO 130/64 LV 136/21  Coronary angiography: Coronary dominance: right  Left mainstem: Widely patent, mild 20% left main stenosis  Left anterior descending (LAD): Medium-caliber vessel. Reaches and wraps around the LV apex. Mid-vessel with 30-40% stenosis. Small diagonal occluded fills from left-left collaterals with late filling.  Left circumflex (LCx): Large vessel. 1st OM with 90% stenosis, 2nd Om with mild diffuse 30-40% stenosis   Right coronary artery (RCA): Dominant vessel, medium caliber. Mid-vessel with 50-60% stenosis. No other significant disease noted.  Left ventriculography: Large area of lateral wall/anterolateral akinesis, other segments hyperdynamic. LVEF estimated 35-40%.  PCI Procedure Note:  Following the diagnostic procedure, the decision was made to proceed with PCI.  Based on the EKG, I was uncertain whether the patient's culprit vessel was the tiny occluded diagonal that filled late, or the large OM with tight 90% stenosis. Since the diagonal was not approachable by PCI and angiographic features were not diagnostic of acute infarction, I elected to proceed with PCI of the OM.  Weight-based bivalirudin was given for anticoagulation. Once a therapeutic ACT was achieved, a 6 [place]  XB-LAD 3.0 guide catheter was inserted.  A BMW coronary guidewire was used to cross the lesion.  The lesion was predilated with a 2.5 mm balloon.  The lesion was then stented with a 2.75x16 mm Promus Premier drug-eluting stent.  The stent was postdilated with a 2.75 mm noncompliant balloon.  Following PCI, there was 0% residual stenosis and TIMI-3 flow. Final angiography confirmed an excellent result. Femoral hemostasis was achieved with Perclose Device.  The patient tolerated the PCI procedure well. There were no immediate procedural complications.  The patient was transferred to the post catheterization recovery area for further monitoring.  PCI Data: Vessel - OM/Segment - proximal Percent Stenosis (pre)  90 TIMI-flow 3 Stent 2.75x16 mm Promus Percent Stenosis (post) 0 TIMI-flow (post) 3  Final Conclusions:   1. Severe OM stenosis with successful PCI 2. Total occlusion of small diagonal 3. Nonobstructive disease of the LAD and RCA 4. Moderate-severe segmental LV dysfunction  Recommendations: post-MI medical therapy. DAPT with ASA and brilinta 12 months.  Tonny Bollman 06/21/2013, 11:56 PM

## 2013-06-22 ENCOUNTER — Encounter: Payer: Self-pay | Admitting: Family Medicine

## 2013-06-22 ENCOUNTER — Encounter (HOSPITAL_COMMUNITY): Payer: Self-pay | Admitting: *Deleted

## 2013-06-22 DIAGNOSIS — I517 Cardiomegaly: Secondary | ICD-10-CM

## 2013-06-22 LAB — CBC
HCT: 37.4 % (ref 36.0–46.0)
MCHC: 33.4 g/dL (ref 30.0–36.0)
MCV: 85.2 fL (ref 78.0–100.0)
Platelets: 169 10*3/uL (ref 150–400)
RBC: 4.39 MIL/uL (ref 3.87–5.11)
RDW: 15.4 % (ref 11.5–15.5)
WBC: 11.6 10*3/uL — ABNORMAL HIGH (ref 4.0–10.5)

## 2013-06-22 LAB — BASIC METABOLIC PANEL
BUN: 11 mg/dL (ref 6–23)
Creatinine, Ser: 0.66 mg/dL (ref 0.50–1.10)
GFR calc Af Amer: >90 mL/min (ref >90)
GFR calc non Af Amer: 87 mL/min — ABNORMAL LOW (ref >90)

## 2013-06-22 LAB — MRSA PCR SCREENING: MRSA by PCR: NEGATIVE

## 2013-06-22 LAB — LIPID PANEL
LDL Cholesterol: 163 mg/dL — ABNORMAL HIGH (ref 0–99)
VLDL: 26 mg/dL (ref 0–40)

## 2013-06-22 LAB — POCT ACTIVATED CLOTTING TIME: Activated Clotting Time: 416 seconds

## 2013-06-22 LAB — TROPONIN I: Troponin I: 1.34 ng/mL (ref <0.30)

## 2013-06-22 MED ORDER — SODIUM CHLORIDE 0.9 % IJ SOLN: 3.0000 mL | INTRAMUSCULAR | Status: DC | PRN

## 2013-06-22 MED ORDER — LISINOPRIL 10 MG PO TABS
10.0000 mg | ORAL_TABLET | Freq: Every day | ORAL | Status: DC
Start: 2013-06-22 — End: 2013-06-24
  Administered 2013-06-22 – 2013-06-24 (×3): 10 mg via ORAL
  Filled 2013-06-22 (×3): qty 1

## 2013-06-22 MED ORDER — ALUM & MAG HYDROXIDE-SIMETH 200-200-20 MG/5ML PO SUSP
30.0000 mL | Freq: Four times a day (QID) | ORAL | Status: DC | PRN
  Administered 2013-06-22: 30 mL via ORAL
  Filled 2013-06-22: qty 30

## 2013-06-22 MED ORDER — NITROGLYCERIN 0.4 MG SL SUBL: 0.4000 mg | SUBLINGUAL_TABLET | SUBLINGUAL | Status: DC | PRN

## 2013-06-22 MED ORDER — CARVEDILOL 3.125 MG PO TABS
3.1250 mg | ORAL_TABLET | Freq: Two times a day (BID) | ORAL | Status: DC
  Administered 2013-06-22 (×2): 3.125 mg via ORAL
  Filled 2013-06-22 (×3): qty 1

## 2013-06-22 MED ORDER — SODIUM CHLORIDE 0.9 % IV SOLN: 250.0000 mL | INTRAVENOUS | Status: DC | PRN

## 2013-06-22 MED ORDER — SODIUM CHLORIDE 0.9 % IJ SOLN
3.0000 mL | Freq: Two times a day (BID) | INTRAMUSCULAR | Status: DC
  Administered 2013-06-22 – 2013-06-23 (×3): 3 mL via INTRAVENOUS

## 2013-06-22 MED ORDER — ALPRAZOLAM 0.25 MG PO TABS: 0.2500 mg | ORAL_TABLET | Freq: Four times a day (QID) | ORAL | Status: DC | PRN

## 2013-06-22 MED ORDER — SODIUM CHLORIDE 0.9 % IV SOLN: 1.0000 mL/kg/h | INTRAVENOUS | Status: AC

## 2013-06-22 MED ORDER — ACETAMINOPHEN 325 MG PO TABS: 650.0000 mg | ORAL_TABLET | ORAL | Status: DC | PRN

## 2013-06-22 MED ORDER — PANTOPRAZOLE SODIUM 40 MG PO TBEC
40.0000 mg | DELAYED_RELEASE_TABLET | Freq: Every day | ORAL | Status: DC
  Administered 2013-06-22 – 2013-06-24 (×3): 40 mg via ORAL
  Filled 2013-06-22 (×3): qty 1

## 2013-06-22 MED ORDER — ASPIRIN EC 81 MG PO TBEC
81.0000 mg | DELAYED_RELEASE_TABLET | Freq: Every day | ORAL | Status: DC
  Administered 2013-06-22 – 2013-06-24 (×3): 81 mg via ORAL
  Filled 2013-06-22 (×3): qty 1

## 2013-06-22 MED ORDER — ONDANSETRON HCL 4 MG/2ML IJ SOLN
4.0000 mg | Freq: Four times a day (QID) | INTRAMUSCULAR | Status: DC | PRN
  Administered 2013-06-22 (×2): 4 mg via INTRAVENOUS
  Filled 2013-06-22 (×2): qty 2

## 2013-06-22 MED ORDER — MAGNESIUM HYDROXIDE 400 MG/5ML PO SUSP: 30.0000 mL | Freq: Every day | ORAL | Status: DC | PRN

## 2013-06-22 MED ORDER — TICAGRELOR 90 MG PO TABS
90.0000 mg | ORAL_TABLET | Freq: Two times a day (BID) | ORAL | Status: DC
  Administered 2013-06-22 – 2013-06-24 (×5): 90 mg via ORAL
  Filled 2013-06-22 (×6): qty 1

## 2013-06-22 MED ORDER — HYDROCODONE-ACETAMINOPHEN 5-325 MG PO TABS
1.0000 | ORAL_TABLET | Freq: Three times a day (TID) | ORAL | Status: DC | PRN
  Administered 2013-06-22: 1 via ORAL
  Filled 2013-06-22: qty 1

## 2013-06-22 MED FILL — Sodium Chloride IV Soln 0.9%: INTRAVENOUS | Qty: 50 | Status: AC

## 2013-06-22 NOTE — Progress Notes (Signed)
On-Call Chaplain completed follow-up visit to [facility] patient who was transported from [facility] on 7/28 in the pm hours.  It was reported by the Nursing Staff that patient was doing well. There was no family present at the time of this visit. Follow-up referral will be made to assigned Chaplain.  On-Call Chaplain Janell Quiet [phone]

## 2013-06-22 NOTE — Progress Notes (Signed)
    Subjective:  No chest pain or dyspnea. Feels well this am.  Objective:  Vital Signs in the last 24 hours: Temp:  [98 F (36.7 C)-98.8 F (37.1 C)] 98.7 F (37.1 C) (07/29 1241) Pulse Rate:  [61-97] 62 (07/29 0600) Resp:  [12-35] 14 (07/29 1241) BP: (115-173)/(49-93) 120/62 mmHg (07/29 1241) SpO2:  [94 %-100 %] 95 % (07/29 0600) Weight:  [64.864 kg (143 lb)-66.7 kg (147 lb 0.8 oz)] 66.7 kg (147 lb 0.8 oz) (07/29 0132)  Intake/Output from previous day: 07/28 0701 - 07/29 0700 In: 340.7 [I.V.:340.7] Out: 1000 [Urine:1000]  Physical Exam: Pt is alert and oriented, pleasant elderly woman in NAD HEENT: normal Neck: JVP - normal, carotids 2+= without bruits Lungs: CTA bilaterally CV: RRR without murmur or gallop Abd: soft, NT, Positive BS, no hepatomegaly Ext: no C/C/E, distal pulses intact and equal, right groin site clear Skin: warm/dry no rash   Lab Results:  Recent Labs  06/21/13 2155 06/22/13 0733  WBC 13.4* 11.6*  HGB 13.3 12.5  PLT 225 169    Recent Labs  06/21/13 2155 06/22/13 0733  NA 141 135  K 3.7 3.9  CL 100 104  CO2 28 22  GLUCOSE 119* 128*  BUN 14 11  CREATININE 0.80 0.66    Recent Labs  06/22/13 0125 06/22/13 0733  TROPONINI 1.34* 3.90*    Cardiac Studies: 2D Echo: Left ventricle: The cavity size was normal. There was mild focal basal hypertrophy of the septum. Systolic function was normal. The estimated ejection fraction was in the range of 50% to 55%. Regional wall motion abnormalities: There is mild hypokinesis of the apical myocardium. Doppler parameters are consistent with abnormal left ventricular relaxation (grade 1 diastolic dysfunction).  ------------------------------------------------------------ Aortic valve: Trileaflet; mildly calcified leaflets. Mobility was not restricted. Doppler: Transvalvular velocity was within the normal range. There was no stenosis. Trivial  regurgitation.  ------------------------------------------------------------ Aorta: Aortic root: The aortic root was normal in size.  ------------------------------------------------------------ Mitral valve: Structurally normal valve. Mobility was not restricted. Doppler: Transvalvular velocity was within the normal range. There was no evidence for stenosis. Trivial regurgitation.  ------------------------------------------------------------ Left atrium: The atrium was normal in size.  ------------------------------------------------------------ Right ventricle: The cavity size was normal. Systolic function was normal.  ------------------------------------------------------------ Pulmonic valve: Doppler: Transvalvular velocity was within the normal range. There was no evidence for stenosis. Trivial regurgitation.  ------------------------------------------------------------ Tricuspid valve: Structurally normal valve. Doppler: Transvalvular velocity was within the normal range. Trivial regurgitation.  ------------------------------------------------------------ Right atrium: The atrium was normal in size.  ------------------------------------------------------------ Pericardium: There was no pericardial effusion.  Tele: Sinus rhythm, no significant arrhythmia, personally reviewed  Assessment/Plan:  1. Lateral STEMI - s/p OM PCI with a DES platform. Pt stable this am. Echo reviewed and LV dysfunction is only mild. Plan continue DAPT x 12 months. Transfer stepdown tomorrow am and anticipate discharge Thursday.  2. HTN - continue ACE and beta blocker  3. Hyperlipidemia. LDL 163. Hx elevated LFT's. Post-MI I think we need to balance risk/benefit. Will start crestor 5 mg and follow-up LFT's closely.  Tonny Bollman, M.D. 06/22/2013, 1:02 PM

## 2013-06-22 NOTE — Progress Notes (Signed)
  Echocardiogram 2D Echocardiogram has been performed.  Cathie Beams 06/22/2013, 8:38 AM

## 2013-06-22 NOTE — Care Management Note (Addendum)
    Page 1 of 1   06/23/2013     10:03:58 AM   CARE MANAGEMENT NOTE 06/23/2013  Patient:  [facility]   Account Number:  [id number]  Date Initiated:  06/22/2013  Documentation initiated by:  Junius Creamer  Subjective/Objective Assessment:   adm w mi     Action/Plan:   lives w grandson, pcp dr Viviann Spare daub   Anticipated DC Date:     Anticipated DC Plan:        DC Planning Services  CM consult      Choice offered to / List presented to:             Status of service:   Medicare Important Message given?   (If response is "NO", the following Medicare IM given date fields will be blank) Date Medicare IM given:   Date Additional Medicare IM given:    Discharge Disposition:    Per UR Regulation:  Reviewed for med. necessity/level of care/duration of stay  If discussed at Long Length of Stay Meetings, dates discussed:    Comments:  7/29 0959 debbie dowell rn,bsn pt given brilinta 30day free and copay assist card. per rep at [facility], ph [phone], no Berkley Harvey is required, patients co-pay at [facility] is $143.46. patient can use any major retail pharmacy

## 2013-06-23 MED ORDER — CARVEDILOL 6.25 MG PO TABS
6.2500 mg | ORAL_TABLET | Freq: Two times a day (BID) | ORAL | Status: DC
  Administered 2013-06-23 – 2013-06-24 (×3): 6.25 mg via ORAL
  Filled 2013-06-23 (×5): qty 1

## 2013-06-23 MED ORDER — ADULT MULTIVITAMIN W/MINERALS CH
1.0000 | ORAL_TABLET | Freq: Every day | ORAL | Status: DC
  Administered 2013-06-24: 1 via ORAL
  Filled 2013-06-23: qty 1

## 2013-06-23 MED ORDER — ATORVASTATIN CALCIUM 10 MG PO TABS
5.0000 mg | ORAL_TABLET | Freq: Every day | ORAL | Status: DC
  Administered 2013-06-23: 5 mg via ORAL
  Filled 2013-06-23 (×2): qty 0.5

## 2013-06-23 NOTE — Progress Notes (Signed)
    Subjective:  Feels good. Had some nausea yesterday but better today. No CP or shortness of breath.  Objective:  Vital Signs in the last 24 hours: Temp:  [98.2 F (36.8 C)-99.5 F (37.5 C)] 98.4 F (36.9 C) (07/30 0400) Pulse Rate:  [90] 90 (07/29 1740) Resp:  [14-16] 14 (07/30 0400) BP: (103-167)/(55-90) 103/55 mmHg (07/30 0600) SpO2:  [95 %-98 %] 98 % (07/30 0400) Weight:  [62.8 kg (138 lb 7.2 oz)] 62.8 kg (138 lb 7.2 oz) (07/30 0500)  Intake/Output from previous day: 07/29 0701 - 07/30 0700 In: 263 [P.O.:260; I.V.:3] Out: 2650 [Urine:2650]  Physical Exam: Pt is alert and oriented, pleasant elderly woman in NAD HEENT: normal Neck: JVP - normal, carotids 2+= without bruits Lungs: CTA bilaterally CV: RRR without murmur or gallop Abd: soft, NT, Positive BS, no hepatomegaly Ext: no C/C/E, distal pulses intact and equal Skin: warm/dry no rash   Lab Results:  Recent Labs  06/21/13 2155 06/22/13 0733  WBC 13.4* 11.6*  HGB 13.3 12.5  PLT 225 169    Recent Labs  06/21/13 2155 06/22/13 0733  NA 141 135  K 3.7 3.9  CL 100 104  CO2 28 22  GLUCOSE 119* 128*  BUN 14 11  CREATININE 0.80 0.66    Recent Labs  06/22/13 0733 06/22/13 1214  TROPONINI 3.90* 11.25*    Cardiac Studies: Echo: Study Conclusions  - Left ventricle: The cavity size was normal. There was mild focal basal hypertrophy of the septum. Systolic function was normal. The estimated ejection fraction was in the range of 50% to 55%. There is mild hypokinesis of the apical myocardium. Doppler parameters are consistent with abnormal left ventricular relaxation (grade 1 diastolic dysfunction). - Aortic valve: Trivial regurgitation.  Tele: Sinus rhythm  Assessment/Plan:  1. Lateral STEMI s/p primary PCI of the 1st OM 2. HTN, suboptimal control 3. Dyslipidemia with hx elevated LFT's on statins in past 4. CREST syndrome  Overall stable. Encouraged by LVEF 50% by echo. Transfer tele today  and plan d/c home tomorrow. Discussed risk/benefit of statin post-MI. Will add low-dose crestor 5 mg and follow LFT's closely as outpatient - recheck 1 month. Increase coreg to 6.25 mg today.   Tonny Bollman, M.D. 06/23/2013, 8:01 AM

## 2013-06-23 NOTE — Progress Notes (Signed)
CARDIAC REHAB PHASE I   PRE:  Rate/Rhythm: 98 SR    BP: sitting 106/59    SaO2:   MODE:  Ambulation: 350 ft   POST:  Rate/Rhythm: 111ST    BP: sitting 107/64     SaO2:   Pt HR had been up to 140 ST earlier. Allowed pt to receive coreg before walking. HR more stable now at 111 ST. No c/o, feels good. Began ed. Pt very receptive. Interested in [facility] and will send referral to [facility] and [facility] (pt not sure which will work with her schedule best). Will need ex and NTG gl tomorrow. [phone]   Elissa Lovett [place] CES, ACSM 06/23/2013 11:48 AM

## 2013-06-24 DIAGNOSIS — I219 Acute myocardial infarction, unspecified: Secondary | ICD-10-CM

## 2013-06-24 DIAGNOSIS — I10 Essential (primary) hypertension: Secondary | ICD-10-CM | POA: Diagnosis present

## 2013-06-24 DIAGNOSIS — I252 Old myocardial infarction: Secondary | ICD-10-CM | POA: Diagnosis present

## 2013-06-24 MED ORDER — NITROGLYCERIN 0.4 MG SL SUBL: 0.4000 mg | SUBLINGUAL_TABLET | SUBLINGUAL | Status: DC | PRN

## 2013-06-24 MED ORDER — TICAGRELOR 90 MG PO TABS: 90.0000 mg | ORAL_TABLET | Freq: Two times a day (BID) | ORAL | Status: DC

## 2013-06-24 MED ORDER — ROSUVASTATIN CALCIUM 5 MG PO TABS: 5.0000 mg | ORAL_TABLET | Freq: Every day | ORAL | Status: DC

## 2013-06-24 MED ORDER — CARVEDILOL 6.25 MG PO TABS: 6.2500 mg | ORAL_TABLET | Freq: Two times a day (BID) | ORAL | Status: DC

## 2013-06-24 MED ORDER — ASPIRIN 81 MG PO TBEC: 81.0000 mg | DELAYED_RELEASE_TABLET | Freq: Every day | ORAL | Status: AC

## 2013-06-24 NOTE — Progress Notes (Signed)
    Subjective:  Feels good. Mild constipation. No CP or dyspnea.  Objective:  Vital Signs in the last 24 hours: Temp:  [98 F (36.7 C)-98.7 F (37.1 C)] 98.3 F (36.8 C) (07/31 0357) Pulse Rate:  [80-84] 80 (07/31 0357) Resp:  [16-18] 16 (07/31 0357) BP: (96-118)/(46-69) 102/69 mmHg (07/31 0357) SpO2:  [97 %-99 %] 97 % (07/31 0357) Weight:  [63.458 kg (139 lb 14.4 oz)] 63.458 kg (139 lb 14.4 oz) (07/31 0357)  Intake/Output from previous day: 07/30 0701 - 07/31 0700 In: 600 [P.O.:600] Out: 200 [Urine:200]  Physical Exam: Pt is alert and oriented, NAD HEENT: normal Neck: JVP - normal, carotids 2+= without bruits Lungs: CTA bilaterally CV: RRR without murmur or gallop Abd: soft, NT, Positive BS, no hepatomegaly Ext: no C/C/E, distal pulses intact and equal Skin: warm/dry no rash, right groin site clear   Lab Results:  Recent Labs  06/21/13 2155 06/22/13 0733  WBC 13.4* 11.6*  HGB 13.3 12.5  PLT 225 169    Recent Labs  06/21/13 2155 06/22/13 0733  NA 141 135  K 3.7 3.9  CL 100 104  CO2 28 22  GLUCOSE 119* 128*  BUN 14 11  CREATININE 0.80 0.66    Recent Labs  06/22/13 0733 06/22/13 1214  TROPONINI 3.90* 11.25*    Tele: Sinus rhythm, personally reviewed  Assessment/Plan:  1. Lateral STEMI s/p primary PCI of the 1st OM  2. HTN, suboptimal control  3. Dyslipidemia with hx elevated LFT's on statins in past  4. CREST syndrome  Remains stable. Discussed post-MI med Rx. Meds reviewed and appropriate. Should remain on ASA 81 mg and brilinta over 12 months. Trial of low-dose crestor with LFT's in 4 weeks. Cont coreg and lisinopril. F/u 1-2 weeks and ok to return to work after f/u visit as long as clinically stable.   Tonny Bollman, M.D. 06/24/2013, 1:21 PM

## 2013-06-24 NOTE — Discharge Summary (Signed)
CARDIOLOGY DISCHARGE SUMMARY   Patient ID: Kristine [name] MRN: [id number] DOB/AGE: 70-Sep-1942 70 y.o.  Admit date: 06/21/2013 Discharge date: 06/24/2013  Primary Discharge Diagnosis:   ST elevation myocardial infarction (STEMI) of anterolateral wall, initial episode of care Secondary Discharge Diagnosis:    GERD (gastroesophageal reflux disease)   Hypertension   Hyperlipidemia LDL goal < 70  Procedures:  Left Heart Cath, Selective Coronary Angiography, LV angiography, PTCA/Stent of the first OM   Hospital Course: Kristine [name] is a 72 y.o. female with no history of CAD. She developed chest pain and was taken to med [facility]. Her ECG was consistent with a STEMI and she was transported emergently to [facility]. She was taken directly to the cath lab.  Full cardiac catheterization results are below. She had a stent to the OM 1. She tolerated the procedure well. Her EF at cath was decreased at 35-40%. She had subsequent echo this admission which showed an EF of 50-55%. This will be followed as an outpatient. She was seen by cardiac rehabilitation. She was educated on MI restrictions, cardiac risk factor reduction and increasing her activity. She is encouraged to followup with cardiac rehabilitation as an outpatient.  She was seen by case management and given a 30 day free prescription for the Brilinta. She was also given a co-pay assist card. She had already been on a statin and this was continued. Because of her MI she was started on a beta blocker.  On 06/24/2013, Kristine [name] was seen by Dr. Excell Seltzer. He evaluated her and considered her stable for discharge, to follow up as an outpatient. She had had problems tolerating statins in the past because of elevated liver functions. Dr. Excell Seltzer discussed the situation with her and she will be tried on a low dose of Crestor. Her liver function tests will be rechecked as an outpatient.   Labs:   Lab Results  Component Value Date   WBC 11.6*  06/22/2013   HGB 12.5 06/22/2013   HCT 37.4 06/22/2013   MCV 85.2 06/22/2013   PLT 169 06/22/2013     Recent Labs Lab 06/21/13 2155 06/22/13 0733  NA 141 135  K 3.7 3.9  CL 100 104  CO2 28 22  BUN 14 11  CREATININE 0.80 0.66  CALCIUM 10.3 9.0  PROT 7.1  --   BILITOT 0.2*  --   ALKPHOS 123*  --   ALT 23  --   AST 20  --   GLUCOSE 119* 128*    Recent Labs  06/22/13 0125 06/22/13 0733 06/22/13 1214  TROPONINI 1.34* 3.90* 11.25*   Lipid Panel     Component Value Date/Time   CHOL 234* 06/22/2013 0733   TRIG 130 06/22/2013 0733   HDL 45 06/22/2013 0733   CHOLHDL 5.2 06/22/2013 0733   VLDL 26 06/22/2013 0733   LDLCALC 163* 06/22/2013 0733      Radiology: Dg Abd Acute W/chest 06/21/2013   **ADDENDUM** CREATED: 06/21/2013 12:26:54  Radiographs were resubmitted with upright and supine annotations on the abdominal radiographs.  No abnormal air-fluid level.  Report is otherwise correct as originally dictated.  **END ADDENDUM** SIGNED BY: Harrel Lemon, M.D.  06/21/2013   *RADIOLOGY REPORT*  Clinical Data: Abdominal pain and bloating  ACUTE ABDOMEN SERIES (ABDOMEN 2 VIEW & CHEST 1 VIEW)  Comparison: None.  Findings: Heart size is normal.  No free air beneath the diaphragms.  Lungs are grossly clear.  No pleural effusion.  Dextroscoliosis of the lumbar  spine centered at L2 noted.  Normal bowel gas pattern allowing for presumed supine technique, although this is not indicated on the film. Presence or absence of air fluid levels or free air cannot be assessed on this single supine view. No abnormal calcific opacity.  Probable bowel content or costochondral junction calcification noted in the left upper quadrant.  IMPRESSION: No acute cardiopulmonary process.  Normal bowel gas pattern.  Clinically significant discrepancy from primary report, if provided: None   Original Report Authenticated By: Christiana Pellant, M.D.    Cardiac Cath: 06/21/2013 Left mainstem: Widely patent, mild 20% left main  stenosis  Left anterior descending (LAD): Medium-caliber vessel. Reaches and wraps around the LV apex. Mid-vessel with 30-40% stenosis. Small diagonal occluded fills from left-left collaterals with late filling.  Left circumflex (LCx): Large vessel. 1st OM with 90% stenosis, 2nd Om with mild diffuse 30-40% stenosis  Right coronary artery (RCA): Dominant vessel, medium caliber. Mid-vessel with 50-60% stenosis. No other significant disease noted.  Left ventriculography: Large area of lateral wall/anterolateral akinesis, other segments hyperdynamic. LVEF estimated 35-40%.  PCI Data:  Vessel - OM/Segment - proximal  Percent Stenosis (pre) 90  TIMI-flow 3  Stent 2.75x16 mm Promus  Percent Stenosis (post) 0  TIMI-flow (post) 3  Final Conclusions:  1. Severe OM stenosis with successful PCI  2. Total occlusion of small diagonal  3. Nonobstructive disease of the LAD and RCA  4. Moderate-severe segmental LV dysfunction  Recommendations: post-MI medical therapy. DAPT with ASA and brilinta 12 months.  EKG:  06/23/2013 Sinus rhythm, rate 94, evolving MI changes  Echo:  06/22/2013 - Left ventricle: The cavity size was normal. There was mild focal basal hypertrophy of the septum. Systolic function was normal. The estimated ejection fraction was in the range of 50% to 55%. There is mild hypokinesis of the apical myocardium. Doppler parameters are consistent with abnormal left ventricular relaxation (grade 1 diastolic dysfunction). - Aortic valve: Trivial regurgitation.  FOLLOW UP PLANS AND APPOINTMENTS Allergies  Allergen Reactions  . Aspirin Other (See Comments)    "Irritating to GERD"  . Nsaids Other (See Comments)    GERD hx  . Statins Other (See Comments)    ELEVATED LFT'S     Medication List         aspirin 81 MG EC tablet  Take 1 tablet (81 mg total) by mouth daily.     carvedilol 6.25 MG tablet  Commonly known as:  COREG  Take 1 tablet (6.25 mg total) by mouth 2 (two) times  daily with a meal.     lisinopril 10 MG tablet  Commonly known as:  PRINIVIL,ZESTRIL  Take 1 tablet (10 mg total) by mouth daily.     multivitamin with minerals Tabs  Take 1 tablet by mouth daily.     nitroGLYCERIN 0.4 MG SL tablet  Commonly known as:  NITROSTAT  Place 1 tablet (0.4 mg total) under the tongue every 5 (five) minutes x 3 doses as needed for chest pain.     omeprazole 20 MG capsule  Commonly known as:  PRILOSEC  Take 20 mg by mouth 2 (two) times daily.     rosuvastatin 5 MG tablet  Commonly known as:  CRESTOR  Take 1 tablet (5 mg total) by mouth daily.     Ticagrelor 90 MG Tabs tablet  Commonly known as:  BRILINTA  Take 1 tablet (90 mg total) by mouth 2 (two) times daily.        Discharge Orders  Future Appointments Provider Department Dept Phone   07/02/2013 10:00 AM Minda Meo, PA-C [facility] [place]   09/13/2013 4:00 PM Dara Lords, MD [facility] [phone]   Future Orders Complete By Expires     Amb Referral to Cardiac Rehabilitation  As directed     Comments:      Referring to [facility]      Follow-up Information   Follow up with Rick Duff, PA-C On 07/02/2013. (at 10:00 am)    Contact information:   [address] [place] [address] [phone]       BRING ALL MEDICATIONS WITH YOU TO FOLLOW UP APPOINTMENTS  Time spent with patient to include physician time: 36 min Signed: Theodore Demark, PA-C 06/24/2013, 12:10 PM Co-Sign MD

## 2013-06-24 NOTE — Progress Notes (Signed)
CARDIAC REHAB PHASE I   PRE:  Rate/Rhythm: 83 SR    BP: sitting 100/60    SaO2:   MODE:  Ambulation: 840 ft   POST:  Rate/Rhythm: 96 SR    BP: sitting 98/70     SaO2:   Tolerated very well, no c/o, feels well. HR more controlled today. Ed completed. Voiced understanding. Excited to make positive changes. [id number]   Kristine [name] CES, ACSM 06/24/2013 10:47 AM

## 2013-06-26 ENCOUNTER — Emergency Department (HOSPITAL_COMMUNITY): Payer: PRIVATE HEALTH INSURANCE

## 2013-06-26 ENCOUNTER — Observation Stay (HOSPITAL_COMMUNITY)
Admission: EM | Admit: 2013-06-26 | Discharge: 2013-06-27 | Disposition: A | Discharge status: Home / Self Care | Payer: PRIVATE HEALTH INSURANCE | Attending: Cardiology | Admitting: Cardiology

## 2013-06-26 ENCOUNTER — Encounter (HOSPITAL_COMMUNITY): Payer: Self-pay | Admitting: Emergency Medicine

## 2013-06-26 DIAGNOSIS — I2510 Atherosclerotic heart disease of native coronary artery without angina pectoris: Secondary | ICD-10-CM

## 2013-06-26 DIAGNOSIS — I252 Old myocardial infarction: Secondary | ICD-10-CM | POA: Insufficient documentation

## 2013-06-26 DIAGNOSIS — E785 Hyperlipidemia, unspecified: Secondary | ICD-10-CM

## 2013-06-26 DIAGNOSIS — R079 Chest pain, unspecified: Secondary | ICD-10-CM

## 2013-06-26 DIAGNOSIS — R072 Precordial pain: Principal | ICD-10-CM | POA: Insufficient documentation

## 2013-06-26 DIAGNOSIS — I2109 ST elevation (STEMI) myocardial infarction involving other coronary artery of anterior wall: Secondary | ICD-10-CM

## 2013-06-26 DIAGNOSIS — Z79899 Other long term (current) drug therapy: Secondary | ICD-10-CM | POA: Insufficient documentation

## 2013-06-26 DIAGNOSIS — Z9861 Coronary angioplasty status: Secondary | ICD-10-CM | POA: Insufficient documentation

## 2013-06-26 DIAGNOSIS — I10 Essential (primary) hypertension: Secondary | ICD-10-CM | POA: Diagnosis present

## 2013-06-26 DIAGNOSIS — K219 Gastro-esophageal reflux disease without esophagitis: Secondary | ICD-10-CM | POA: Diagnosis present

## 2013-06-26 HISTORY — DX: Atherosclerotic heart disease of native coronary artery without angina pectoris: I25.10

## 2013-06-26 HISTORY — DX: Hyperlipidemia, unspecified: E78.5

## 2013-06-26 LAB — COMPREHENSIVE METABOLIC PANEL
AST: 19 U/L (ref 0–37)
CO2: 24 mEq/L (ref 19–32)
Calcium: 9.9 mg/dL (ref 8.4–10.5)
Creatinine, Ser: 0.8 mg/dL (ref 0.50–1.10)
GFR calc Af Amer: 85 mL/min — ABNORMAL LOW (ref >90)
GFR calc non Af Amer: 73 mL/min — ABNORMAL LOW (ref >90)

## 2013-06-26 LAB — CBC
Hemoglobin: 12.7 g/dL (ref 12.0–15.0)
RBC: 4.44 MIL/uL (ref 3.87–5.11)

## 2013-06-26 LAB — POCT I-STAT TROPONIN I

## 2013-06-26 LAB — CREATININE, SERUM
Creatinine, Ser: 0.8 mg/dL (ref 0.50–1.10)
GFR calc Af Amer: 85 mL/min — ABNORMAL LOW (ref >90)
GFR calc non Af Amer: 73 mL/min — ABNORMAL LOW (ref >90)

## 2013-06-26 LAB — TROPONIN I: Troponin I: 1.1 ng/mL (ref <0.30)

## 2013-06-26 MED ORDER — ONDANSETRON HCL 4 MG/2ML IJ SOLN: 4.0000 mg | Freq: Four times a day (QID) | INTRAMUSCULAR | Status: DC | PRN

## 2013-06-26 MED ORDER — SODIUM CHLORIDE 0.9 % IJ SOLN
3.0000 mL | Freq: Two times a day (BID) | INTRAMUSCULAR | Status: DC
  Administered 2013-06-26 – 2013-06-27 (×3): 3 mL via INTRAVENOUS

## 2013-06-26 MED ORDER — ACETAMINOPHEN 325 MG PO TABS
650.0000 mg | ORAL_TABLET | ORAL | Status: DC | PRN
  Administered 2013-06-26: 650 mg via ORAL
  Filled 2013-06-26: qty 2

## 2013-06-26 MED ORDER — NITROGLYCERIN 0.4 MG SL SUBL: 0.4000 mg | SUBLINGUAL_TABLET | SUBLINGUAL | Status: DC | PRN

## 2013-06-26 MED ORDER — ASPIRIN EC 81 MG PO TBEC
81.0000 mg | DELAYED_RELEASE_TABLET | Freq: Every day | ORAL | Status: DC
  Administered 2013-06-26 – 2013-06-27 (×2): 81 mg via ORAL
  Filled 2013-06-26 (×2): qty 1

## 2013-06-26 MED ORDER — GI COCKTAIL ~~LOC~~
30.0000 mL | Freq: Once | ORAL | Status: AC
  Administered 2013-06-26: 30 mL via ORAL
  Filled 2013-06-26: qty 30

## 2013-06-26 MED ORDER — HEPARIN SODIUM (PORCINE) 5000 UNIT/ML IJ SOLN
5000.0000 [IU] | Freq: Three times a day (TID) | INTRAMUSCULAR | Status: DC
  Administered 2013-06-26 – 2013-06-27 (×3): 5000 [IU] via SUBCUTANEOUS
  Filled 2013-06-26 (×6): qty 1

## 2013-06-26 MED ORDER — TICAGRELOR 90 MG PO TABS
90.0000 mg | ORAL_TABLET | Freq: Two times a day (BID) | ORAL | Status: DC
  Administered 2013-06-26 – 2013-06-27 (×3): 90 mg via ORAL
  Filled 2013-06-26 (×4): qty 1

## 2013-06-26 MED ORDER — PANTOPRAZOLE SODIUM 40 MG PO TBEC
40.0000 mg | DELAYED_RELEASE_TABLET | Freq: Every day | ORAL | Status: DC
  Administered 2013-06-26 – 2013-06-27 (×2): 40 mg via ORAL
  Filled 2013-06-26 (×2): qty 1

## 2013-06-26 MED ORDER — CARVEDILOL 6.25 MG PO TABS
6.2500 mg | ORAL_TABLET | Freq: Two times a day (BID) | ORAL | Status: DC
  Administered 2013-06-26 – 2013-06-27 (×2): 6.25 mg via ORAL
  Filled 2013-06-26 (×4): qty 1

## 2013-06-26 MED ORDER — LISINOPRIL 10 MG PO TABS
10.0000 mg | ORAL_TABLET | Freq: Every day | ORAL | Status: DC
  Administered 2013-06-26 – 2013-06-27 (×2): 10 mg via ORAL
  Filled 2013-06-26 (×2): qty 1

## 2013-06-26 MED ORDER — ATORVASTATIN CALCIUM 10 MG PO TABS
10.0000 mg | ORAL_TABLET | Freq: Every day | ORAL | Status: DC
  Administered 2013-06-26: 10 mg via ORAL
  Filled 2013-06-26 (×2): qty 1

## 2013-06-26 MED ORDER — SODIUM CHLORIDE 0.9 % IJ SOLN: 3.0000 mL | INTRAMUSCULAR | Status: DC | PRN

## 2013-06-26 MED ORDER — ADULT MULTIVITAMIN W/MINERALS CH
1.0000 | ORAL_TABLET | Freq: Every day | ORAL | Status: DC
  Administered 2013-06-26 – 2013-06-27 (×2): 1 via ORAL
  Filled 2013-06-26 (×2): qty 1

## 2013-06-26 MED ORDER — SODIUM CHLORIDE 0.9 % IV SOLN: 250.0000 mL | INTRAVENOUS | Status: DC | PRN

## 2013-06-26 NOTE — ED Notes (Signed)
Abnormal labs given to RN Lillia Abed

## 2013-06-26 NOTE — H&P (Signed)
HPI: 72 year old female with recent lateral infarct status post PCI of her obtuse marginal admitted with chest pain. Patient presented with an acute lateral infarct on 06/21/2013. She underwent emergent cardiac catheterization which revealed a 20% left main, 30-40% mid LAD, occluded small diagonal with left to left collaterals, 90% first obtuse marginal and 50-60% mid right coronary artery. Ejection fraction 35-40%. Patient had PCI of her obtuse marginal with a drug-eluting stent. Echocardiogram showed an ejection fraction of 50-55% with hypokinesis of the apex. There was trace aortic insufficiency. The patient did well and was discharged on July 31. She has been compliant with all of her medications including antiplatelet therapy. At 1:00 this morning she awoke with a pain in the right breast area described as a stabbing sensation. It would last several seconds and resolve but it would recur. She then noticed a dull pain in her right scapular area. Her pain was not pleuritic, positional and not like her infarct pain. No associated symptoms. She stated the pain in her scapula increased when palpated by EMT. Patient presented to the emergency room and is presently pain-free.    (Not in a hospital admission)  Allergies  Allergen Reactions  . Aspirin Other (See Comments)    "Irritating to GERD"  . Nsaids Other (See Comments)    GERD hx  . Statins Other (See Comments)    ELEVATED LFT'S    Past Medical History  Diagnosis Date  . GERD (gastroesophageal reflux disease)   . Barrett's esophagus     Stage B  . Cystocele   . Rectocele     mild  . Hypertension   . Diverticulitis   . Arthritis   . Raynaud disease   . CREST variant of scleroderma   . Osteopenia 09/2012    T score -2.4 FRAX 13%/2.7%  per 10/15/2012 discussion plan repeat in 2 years  . Anemia   . STEMI (ST elevation myocardial infarction) 06/21/2013  . CAD (coronary artery disease)   . Hyperlipidemia     Past Surgical History    Procedure Laterality Date  . Tonsillectomy  1950  . Tubal ligation  1980  . Nissen fundoplication  2006  . Total knee arthroplasty      right  . Joint replacement  9/10    rt total  . Foot surgery  2013    x2  . Abdominal hysterectomy  1996    RSO for menorrhagia.  . Coronary angioplasty with stent placement  06/21/2013    History   Social History  . Marital Status: Divorced    Spouse Name: N/A    Number of Children: N/A  . Years of Education: N/A   Occupational History  . Not on file.   Social History Main Topics  . Smoking status: Never Smoker   . Smokeless tobacco: Never Used  . Alcohol Use: Yes     Comment: Occasional  . Drug Use: No  . Sexually Active: No   Other Topics Concern  . Not on file   Social History Narrative   Exercise dancing 3 x times weekly for 1-2 hours    Family History  Problem Relation Age of Onset  . Cancer Mother     Pancreatic Cancer  . Hepatitis C Mother   . Heart disease Father     congestive heart failure  . Panic disorder Daughter   . Asthma Maternal Grandmother   . Emphysema Maternal Grandmother   . Cancer Maternal Grandfather     lung  ROS:  no fevers or chills, productive cough, hemoptysis, dysphasia, odynophagia, melena, hematochezia, dysuria, hematuria, rash, seizure activity, orthopnea, PND, pedal edema, claudication. Remaining systems are negative.  Physical Exam:   Blood pressure 104/56, pulse 65, temperature 98.4 F (36.9 C), temperature source Oral, resp. rate 13, height 5\' 1"  (1.549 m), weight 140 lb (63.504 kg), last menstrual period 11/25/1994, SpO2 97.00%.  General:  Well developed/well nourished in NAD Skin warm/dry Patient not depressed No peripheral clubbing Back-normal HEENT-normal/normal eyelids Neck supple/normal carotid upstroke bilaterally; no bruits; no JVD; no thyromegaly chest - CTA/ normal expansion CV - RRR/normal S1 and S2; no murmurs, rubs or gallops;  PMI nondisplaced Abdomen -NT/ND,  no HSM, no mass, + bowel sounds, no bruit 2+ femoral pulses, no bruits Ext-no edema, chords, 2+ DP Neuro-grossly nonfocal  ECG sinus rhythm, RV conduction delay, pronounced lateral T-wave inversion which is evolving from recent lateral infarct.  Results for orders placed during the hospital encounter of 06/26/13 (from the past 48 hour(s))  COMPREHENSIVE METABOLIC PANEL     Status: Abnormal   Collection Time    06/26/13  3:45 AM      Result Value Range   Sodium 138  135 - 145 mEq/L   Potassium 3.8  3.5 - 5.1 mEq/L   Chloride 103  96 - 112 mEq/L   CO2 24  19 - 32 mEq/L   Glucose, Bld 119 (*) 70 - 99 mg/dL   BUN 19  6 - 23 mg/dL   Creatinine, Ser 0.45  0.50 - 1.10 mg/dL   Calcium 9.9  8.4 - 40.9 mg/dL   Total Protein 6.7  6.0 - 8.3 g/dL   Albumin 3.6  3.5 - 5.2 g/dL   AST 19  0 - 37 U/L   ALT 17  0 - 35 U/L   Alkaline Phosphatase 106  39 - 117 U/L   Total Bilirubin 0.4  0.3 - 1.2 mg/dL   GFR calc non Af Amer 73 (*) >90 mL/min   GFR calc Af Amer 85 (*) >90 mL/min   Comment:            The eGFR has been calculated     using the CKD EPI equation.     This calculation has not been     validated in all clinical     situations.     eGFR's persistently     <90 mL/min signify     possible Chronic Kidney Disease.  POCT I-STAT TROPONIN I     Status: Abnormal   Collection Time    06/26/13  3:50 AM      Result Value Range   Troponin i, poc 2.17 (*) 0.00 - 0.08 ng/mL   Comment NOTIFIED PHYSICIAN     Comment 3            Comment: Due to the release kinetics of cTnI,     a negative result within the first hours     of the onset of symptoms does not rule out     myocardial infarction with certainty.     If myocardial infarction is still suspected,     repeat the test at appropriate intervals.  POCT I-STAT TROPONIN I     Status: Abnormal   Collection Time    06/26/13  6:53 AM      Result Value Range   Troponin i, poc 1.58 (*) 0.00 - 0.08 ng/mL   Comment NOTIFIED PHYSICIAN      Comment 3  Comment: Due to the release kinetics of cTnI,     a negative result within the first hours     of the onset of symptoms does not rule out     myocardial infarction with certainty.     If myocardial infarction is still suspected,     repeat the test at appropriate intervals.    Dg Chest Port 1 View  06/26/2013   *RADIOLOGY REPORT*  Clinical Data: Chest pain  PORTABLE CHEST - 1 VIEW  Comparison: Prior radiograph from 10/26/2009  Findings: The cardiac and mediastinal silhouettes are stable in size and contour, and remain within normal limits. Atherosclerotic calcifications are noted within the aortic arch, unchanged.  The lungs are well inflated.  No pulmonary edema, pleural effusion, or pneumothorax is identified.  No focal infiltrate to suggest acute infectious pneumonitis is identified.  No acute osseous abnormality identified.  IMPRESSION:  No acute cardiopulmonary process.   Original Report Authenticated By: Rise Mu, M.D.    Assessment/Plan 1 chest pain-patient symptoms are atypical. Her enzymes are abnormal but appear to be trending down from her recent infarct. Her symptoms are unlike her recent infarct pain. However given recent PCI I will admit for observation. Cycle enzymes. If they continue to trend down then she will be discharged tomorrow morning. Symptoms may be musculoskeletal pain. 2 coronary artery disease status post recent lateral MI-continue dual antiplatelet therapy, statin, beta blocker and ACE inhibitor. 3 hyperlipidemia-continue statin. She has had increased liver functions previously was statin and this will be followed closely following discharge. 4 hypertension-continue preadmission medications.  Olga Millers MD 06/26/2013, 9:43 AM

## 2013-06-26 NOTE — ED Provider Notes (Signed)
CSN: [id number]     Arrival date & time 06/26/13  0226 History     First MD Initiated Contact with Patient 06/26/13 0251     Chief Complaint  Patient presents with  . Chest Pain   HPI Kristine [name] is a 72 y.o. female presenting with right-sided sharp, intermittent chest pain is currently not present. Pain came on while she was lying down talking on the phone. The pain is present, it does not radiate, it lasts for approximately 2 minutes and then goes away by itself. Patient has a pertinent Medical history of having had an MI in the anterolateral wall on Monday, she received a stent placed by Dr. Excell Seltzer and has been on ticagrelor and has been taking her medicine as prescribed. She has not skipped any doses.  She's had no associated symptoms, she denies shortness of breath, dizziness, nausea, vomiting, diaphoresis, abdominal pain, lower extremity edema, headache, any other recent illness, fever, chills.  She says she did have some pain overlying the right shoulder blade, this is a dull pain it hurts when she applies pressure to the area.     Past Medical History  Diagnosis Date  . GERD (gastroesophageal reflux disease)   . Barrett's esophagus     Stage B  . Cystocele   . Rectocele     mild  . Hypertension   . Diverticulitis   . Arthritis   . Raynaud disease   . CREST variant of scleroderma   . Osteopenia 09/2012    T score -2.4 FRAX 13%/2.7%  per 10/15/2012 discussion plan repeat in 2 years  . Anemia   . STEMI (ST elevation myocardial infarction) 06/21/2013   Past Surgical History  Procedure Laterality Date  . Tonsillectomy  1950  . Tubal ligation  1980  . Nissen fundoplication  2006  . Total knee arthroplasty      right  . Joint replacement  9/10    rt total  . Foot surgery  2013    x2  . Abdominal hysterectomy  1996    RSO for menorrhagia.  . Coronary angioplasty with stent placement  06/21/2013   Family History  Problem Relation Age of Onset  . Cancer Mother    Pancreatic Cancer  . Hepatitis C Mother   . Heart disease Father     congestive heart failure  . Panic disorder Daughter   . Asthma Maternal Grandmother   . Emphysema Maternal Grandmother   . Cancer Maternal Grandfather     lung   History  Substance Use Topics  . Smoking status: Never Smoker   . Smokeless tobacco: Never Used  . Alcohol Use: No   OB History   Grav Para Term Preterm Abortions TAB SAB Ect Mult Living   1 1        1      Review of Systems At least 10pt or greater review of systems completed and are negative except where specified in the HPI.  Allergies  Aspirin; Nsaids; and Statins  Home Medications   Current Outpatient Rx  Name  Route  Sig  Dispense  Refill  . aspirin EC 81 MG EC tablet   Oral   Take 1 tablet (81 mg total) by mouth daily.         . carvedilol (COREG) 6.25 MG tablet   Oral   Take 1 tablet (6.25 mg total) by mouth 2 (two) times daily with a meal.   60 tablet   11   .  lisinopril (PRINIVIL,ZESTRIL) 10 MG tablet   Oral   Take 1 tablet (10 mg total) by mouth daily.   90 tablet   3     Rx has expired - no refills remain   . Multiple Vitamin (MULTIVITAMIN WITH MINERALS) TABS   Oral   Take 1 tablet by mouth daily.         . nitroGLYCERIN (NITROSTAT) 0.4 MG SL tablet   Sublingual   Place 1 tablet (0.4 mg total) under the tongue every 5 (five) minutes x 3 doses as needed for chest pain.   25 tablet   12   . omeprazole (PRILOSEC) 20 MG capsule   Oral   Take 20 mg by mouth 2 (two) times daily.           . rosuvastatin (CRESTOR) 5 MG tablet   Oral   Take 1 tablet (5 mg total) by mouth daily.   30 tablet   11   . Ticagrelor (BRILINTA) 90 MG TABS tablet   Oral   Take 1 tablet (90 mg total) by mouth 2 (two) times daily.   60 tablet   11    BP 117/58  Pulse 67  Temp(Src) 98.4 F (36.9 C) (Oral)  Ht 5\' 1"  (1.549 m)  Wt 140 lb (63.504 kg)  BMI 26.47 kg/m2  SpO2 100%  LMP 11/25/1994 Physical Exam  Nursing notes  reviewed.  Electronic medical record reviewed. VITAL SIGNS:   Filed Vitals:   06/26/13 0238  BP: 117/58  Pulse: 67  Temp: 98.4 F (36.9 C)  TempSrc: Oral  Height: 5\' 1"  (1.549 m)  Weight: 140 lb (63.504 kg)  SpO2: 100%   CONSTITUTIONAL: Awake, oriented, appears non-toxic HENT: Atraumatic, normocephalic, oral mucosa pink and moist, airway patent. Nares patent without drainage. External ears normal. EYES: Conjunctiva clear, EOMI, PERRLA NECK: Trachea midline, non-tender, supple CARDIOVASCULAR: Normal heart rate, Normal rhythm, No murmurs, rubs, gallops PULMONARY/CHEST: Clear to auscultation, no rhonchi, wheezes, or rales. Symmetrical breath sounds. Non-tender. ABDOMINAL: Non-distended, soft, non-tender - no rebound or guarding.  BS normal. NEUROLOGIC: Non-focal, moving all four extremities, no gross sensory or motor deficits. EXTREMITIES: No clubbing, cyanosis, or edema.  Muscle just medial to right scapula is mildly TTP. SKIN: Warm, Dry, No erythema, No rash  ED Course   Procedures (including critical care time)  Date: 06/26/2013  Rate: 68  Rhythm: normal sinus rhythm  QRS Axis: normal  Intervals: normal  ST/T Wave abnormalities: T-wave flattening in I and inversion in aVL  Conduction Disutrbances: none  Narrative Interpretation: No acute ST elevation or ST depression, patient's T waves were inverted in lead 1 and previously, this is in the distribution of her prior MI-no new changes consistent with acute ischemia or infarct   Date: 06/26/2013  Rate:69  Rhythm: normal sinus rhythm  QRS Axis: normal  Intervals: normal  ST/T Wave abnormalities: T-wave flattening in I and inversion in aVL  Conduction Disutrbances: none  Narrative Interpretation: No acute ST elevation or ST depression, patient's T waves were inverted in lead 1 and previously, this is in the distribution of her prior MI-no new changes consistent with acute ischemia or infarct.  Labs Reviewed  COMPREHENSIVE  METABOLIC PANEL - Abnormal; Notable for the following:    Glucose, Bld 119 (*)    GFR calc non Af Amer 73 (*)    GFR calc Af Amer 85 (*)    All other components within normal limits  POCT I-STAT TROPONIN I - Abnormal; Notable for  the following:    Troponin i, poc 2.17 (*)    All other components within normal limits   Dg Chest [facility] 1 View  06/26/2013   *RADIOLOGY REPORT*  Clinical Data: Chest pain  PORTABLE CHEST - 1 VIEW  Comparison: Prior radiograph from 10/26/2009  Findings: The cardiac and mediastinal silhouettes are stable in size and contour, and remain within normal limits. Atherosclerotic calcifications are noted within the aortic arch, unchanged.  The lungs are well inflated.  No pulmonary edema, pleural effusion, or pneumothorax is identified.  No focal infiltrate to suggest acute infectious pneumonitis is identified.  No acute osseous abnormality identified.  IMPRESSION:  No acute cardiopulmonary process.   Original Report Authenticated By: Rise Mu, M.D.   1. Precordial pain   2. ST elevation myocardial infarction (STEMI) of anterolateral wall, initial episode of care     MDM  Kristine [name] is a 72 y.o. female with recent hospitalization for STEMI status post stenting to obtuse marginal presents with right-sided atypical chest pain.  Patient also says this is not consistent with prior pain, she is also history of Barrett's esophagus. I do not think that her right shoulder blade pain is related to diaphragmatic pain-no evidence for biliary obstruction, LFTs are not elevated, alkaline phosphatase is not elevated,. Is a white count. EKG is unremarkable for acute ischemia or infarction.  Patient had 2 to, 2 EKGs, troponin was positive but is trending downward.  Doubt ACS, doubt clotted stent but discussed with Dr. Arlyn Leak from cardiology, he will assess the patient in the ED    Jones Skene, MD 06/27/13 2305

## 2013-06-26 NOTE — ED Notes (Signed)
Pt up to the bathroom. Denies any pain at this time. Skin warm and dry. No distress noted.

## 2013-06-26 NOTE — ED Notes (Signed)
Pt brought in via EMS. Pt was speaking on the phone when she started feeling pain in her right chest and back. Pt was discharged from the hospital yesterday, after having an MI on Monday. The pt states that the pain that she is currently having does not feel like he MI. Pt denies SOB, diaphoresis, nausea and vomiting. Pt received 324 mg of Aspirin and 1 Nitro with no relief on the ambulance.

## 2013-06-26 NOTE — ED Notes (Signed)
Pt waiting for cardiology to see her. No complaints at this time.

## 2013-06-27 DIAGNOSIS — I2510 Atherosclerotic heart disease of native coronary artery without angina pectoris: Secondary | ICD-10-CM

## 2013-06-27 DIAGNOSIS — E785 Hyperlipidemia, unspecified: Secondary | ICD-10-CM

## 2013-06-27 LAB — TROPONIN I: Troponin I: 1.12 ng/mL (ref <0.30)

## 2013-06-27 NOTE — Discharge Summary (Signed)
See progress notes [facility] [employer]  

## 2013-06-27 NOTE — Discharge Summary (Signed)
Discharge Summary   Patient ID: Kristine [name],  MRN: [id number], DOB/AGE: 70-Jul-1942 70 y.o.  Admit date: 06/26/2013 Discharge date: 06/27/2013  Primary Physician: Lucilla Edin, MD Primary Cardiologist: Judie Petit. Excell Seltzer, MD  Discharge Diagnoses Principal Problem:   Precordial pain Active Problems:   CAD (coronary artery disease), native coronary artery   GERD (gastroesophageal reflux disease)   Hypertension   Hyperlipidemia  Allergies Allergies  Allergen Reactions  . Aspirin Other (See Comments)    "Irritating to GERD"  . Nsaids Other (See Comments)    GERD hx  . Statins Other (See Comments)    ELEVATED LFT'S    Diagnostic Studies/Procedures  PORTABLE CHEST X-RAY - 06/26/13  IMPRESSION:  No acute cardiopulmonary process.  History of Present Illness  Kristine [name] is a 72 y.o. female who was recently admitted for acute lateral STEMI s/p DES-LCx on 06/21/13. She was discharged on 06/24/13. She has a history of CAD, scleroderma/CREST syndrome, HTN, HLD (elevated LFTs on statins previously) and GERD.   Cardiac cath 7/28: 20% left main, 30-40% mid LAD, occluded small diagonal with left to left collaterals, 90% first obtuse marginal and 50-60% mid right coronary artery. Ejection fraction 35-40%. Patient had PCI of her obtuse marginal with a drug-eluting stent.  Follow-up 2D echo: EF 50-55% and apical HK, trace AI.   She had felt generally well post-discharge. She had been compliant with DAPT. The morning of 06/26/13, she awoke at 1:00 AM with pain the R breast area described as stabbing lasting several seconds and resolving spontaneously. She then began experiencing a dull pain in her R scapular area. No pleuritic, positional components. Not reminiscent of prior MI. Noted to be tender on palpation by EMT. She was transported to the ED.  There, EKG revealed old RBBB, no evidence of ischemia. Initial trop-I 2.17, felt to be appropriately down-trending from recent MI. CXR w/o acute abnormalities.  CMET and CBC unremarkable. Although the patient's chest pain was atypical, the plan was made to observe overnight and trend cardiac biomarkers to exclude stent thrombosis or re-infarction.   Hospital Course   She was resumed on all outpatient medications and remained asymptomatic overnight. Troponin trend: 1.58->1.03->1.10->1.12 consistent with an appropriate down-trend. She was evaluated the following morning by Dr. Jens Som and deemed stable for discharge. The patient's chest pain was suspected to be due to musculoskeletal etiologies. She will continue the medication regimen outlined below. She will follow-up on 07/02/13 as previously scheduled. LFTs and lipid panel should be checked in about 4 weeks given statin initiation and prior LFT elevation on statins. This information has been clearly outlined in the discharge AVS.    Discharge Vitals:  Blood pressure 113/43, pulse 69, temperature 97.7 F (36.5 C), temperature source Oral, resp. rate 16, height 5\' 1"  (1.549 m), weight 63.05 kg (139 lb), last menstrual period 11/25/1994, SpO2 99.00%.   Weight change: -0.454 kg (-1 lb)  Labs: Recent Labs     06/26/13  1428  WBC  8.9  HGB  12.7  HCT  38.3  MCV  86.3  PLT  187   Recent Labs Lab 06/21/13 2155 06/22/13 0733 06/26/13 0345 06/26/13 1428  NA 141 135 138  --   K 3.7 3.9 3.8  --   CL 100 104 103  --   CO2 28 22 24   --   BUN 14 11 19   --   CREATININE 0.80 0.66 0.80 0.80  CALCIUM 10.3 9.0 9.9  --   PROT 7.1  --  6.7  --   BILITOT 0.2*  --  0.4  --   ALKPHOS 123*  --  106  --   ALT 23  --  17  --   AST 20  --  19  --   GLUCOSE 119* 128* 119*  --    Recent Labs     06/26/13  1429  06/26/13  2008  06/27/13  0145  TROPONINI  1.03*  1.10*  1.12*   Disposition:  Discharge Orders   Future Appointments Provider Department Dept Phone   07/02/2013 10:00 AM Minda Meo, PA-C [facility] [place]) [phone]   09/13/2013 4:00 PM Dara Lords, MD  [facility] [phone]   Future Orders Complete By Expires     Diet - low sodium heart healthy  As directed     Increase activity slowly  As directed           Follow-up Information   Follow up with Rick Duff, PA-C On 07/02/2013. (At 10:00 AM as previously scheduled. )    Contact information:   [address] [place] [address] [phone]       Follow up with DAUB, STEVE A, MD. Schedule an appointment as soon as possible for a visit in 1 week. (For general post-hospital follow-up. )    Contact information:   [address] [place] [address] [phone]       Discharge Medications:    Medication List         aspirin 81 MG EC tablet  Take 1 tablet (81 mg total) by mouth daily.     carvedilol 6.25 MG tablet  Commonly known as:  COREG  Take 1 tablet (6.25 mg total) by mouth 2 (two) times daily with a meal.     lisinopril 10 MG tablet  Commonly known as:  PRINIVIL,ZESTRIL  Take 1 tablet (10 mg total) by mouth daily.     multivitamin with minerals Tabs  Take 1 tablet by mouth daily.     nitroGLYCERIN 0.4 MG SL tablet  Commonly known as:  NITROSTAT  Place 1 tablet (0.4 mg total) under the tongue every 5 (five) minutes x 3 doses as needed for chest pain.     omeprazole 20 MG capsule  Commonly known as:  PRILOSEC  Take 20 mg by mouth 2 (two) times daily.     rosuvastatin 5 MG tablet  Commonly known as:  CRESTOR  Take 1 tablet (5 mg total) by mouth daily.     Ticagrelor 90 MG Tabs tablet  Commonly known as:  BRILINTA  Take 1 tablet (90 mg total) by mouth 2 (two) times daily.       Outstanding Labs/Studies: Recommend LFTs and lipid panel in ~ 4 weeks  Duration of Discharge Encounter: Greater than 30 minutes including physician time.  Signed, R. Hurman Horn, PA-C 06/27/2013, 11:18 AM

## 2013-06-27 NOTE — Progress Notes (Signed)
   Subjective:  Denies CP or dyspnea   Objective:  Filed Vitals:   06/26/13 1105 06/26/13 1422 06/26/13 2008 06/27/13 0447  BP: 101/63 112/61 107/47 113/43  Pulse: 73 71 78 69  Temp: 98.1 F (36.7 C) 98.2 F (36.8 C) 98.1 F (36.7 C) 97.7 F (36.5 C)  TempSrc: Oral Oral    Resp: 14 16 18 16   Height: 5\' 1"  (1.549 m)     Weight: 139 lb (63.05 kg)     SpO2: 100% 100% 99% 99%    Intake/Output from previous day:  Intake/Output Summary (Last 24 hours) at 06/27/13 1020 Last data filed at 06/27/13 0300  Gross per 24 hour  Intake    603 ml  Output      0 ml  Net    603 ml    Physical Exam: Physical exam: Well-developed well-nourished in no acute distress.  Skin is warm and dry.  HEENT is normal.  Neck is supple.  Chest is clear to auscultation with normal expansion.  Cardiovascular exam is regular rate and rhythm.  Abdominal exam nontender or distended. No masses palpated. Extremities show no edema. neuro grossly intact    Lab Results: Basic Metabolic Panel:  Recent Labs  78/29/56 0345 06/26/13 1428  NA 138  --   K 3.8  --   CL 103  --   CO2 24  --   GLUCOSE 119*  --   BUN 19  --   CREATININE 0.80 0.80  CALCIUM 9.9  --    CBC:  Recent Labs  06/26/13 1428  WBC 8.9  HGB 12.7  HCT 38.3  MCV 86.3  PLT 187   Cardiac Enzymes:  Recent Labs  06/26/13 1429 06/26/13 2008 06/27/13 0145  TROPONINI 1.03* 1.10* 1.12*     Assessment/Plan:  1 chest pain-patient symptoms are atypical and have resolved. Her enzymes are abnormal but continue to trend down from her recent infarct. Her symptoms are unlike her recent infarct pain. I do not think they are cardiac related. Symptoms may be musculoskeletal pain.  2 coronary artery disease status post recent lateral MI-continue dual antiplatelet therapy, statin, beta blocker and ACE inhibitor.  3 hyperlipidemia-continue statin. She has had increased liver functions previously was statin and this will be followed  closely following discharge.  4 hypertension-continue preadmission medications. DC today and fu with Dr Excell Seltzer as scheduled.  Kristine [name] 06/27/2013, 10:20 AM

## 2013-06-29 ENCOUNTER — Telehealth: Payer: Self-pay | Admitting: Cardiovascular Disease

## 2013-06-29 NOTE — Telephone Encounter (Signed)
New Prob  Pt states she has really bad gas and stomach cramps. She wants to know what she can do for it.

## 2013-06-29 NOTE — Telephone Encounter (Signed)
I spoke with the Kristine [name] and she is complaining of "horrific stomach cramping and pain" and frequent gas since she was in the hospital.  Prior to her MI she did have gas for a few days. The Kristine [name] said the only new medications she is taking are Coreg, Crestor and Brilinta.  The Kristine [name] knows the importance of Brilinta so she is taking it as directed. Today the Kristine [name] has not taken Coreg or Crestor because she is unsure if one of these medications could be causing the problem. The Kristine [name] does take a coated ASA. I instructed the Kristine [name] that she can hold her Crestor but she should take her normal dose of Coreg. The Kristine [name] also follows with a GI MD at [facility] for Barrett's esophagus and had to cancel her recent endoscopy. I instructed the Kristine [name] that she could try TUMS for symptoms and contact her GI MD also for recommendations.  I will speak with Dr Excell Seltzer to further recommendations.

## 2013-06-29 NOTE — Telephone Encounter (Signed)
Agree. Would hold crestor and see how she does over the next few days. If symptoms continue, would stop coreg next. She should call back and let us know how she's doing. Sounds like medication side-effect.

## 2013-06-30 NOTE — Telephone Encounter (Signed)
I spoke with the pt and made her aware that Dr Excell Seltzer would like her to hold Crestor for 7 days and see if her symptoms improve. The pt states that she already feels better today in regards to stomach pain and gas.  The pt will continue to hold Crestor and call the office next week to make [place] aware how she is feeling.  At that time we will make further recommendations in regard to her medications.

## 2013-07-02 ENCOUNTER — Encounter: Payer: Self-pay | Admitting: *Deleted

## 2013-07-02 ENCOUNTER — Ambulatory Visit (INDEPENDENT_AMBULATORY_CARE_PROVIDER_SITE_OTHER): Payer: PRIVATE HEALTH INSURANCE | Admitting: Cardiology

## 2013-07-02 ENCOUNTER — Encounter: Payer: Self-pay | Admitting: Cardiology

## 2013-07-02 ENCOUNTER — Telehealth: Payer: Self-pay | Admitting: Cardiology

## 2013-07-02 VITALS — BP 116/68 | HR 76 | Ht 61.0 in | Wt 140.4 lb

## 2013-07-02 DIAGNOSIS — I10 Essential (primary) hypertension: Secondary | ICD-10-CM

## 2013-07-02 DIAGNOSIS — Z955 Presence of coronary angioplasty implant and graft: Secondary | ICD-10-CM

## 2013-07-02 DIAGNOSIS — I252 Old myocardial infarction: Secondary | ICD-10-CM

## 2013-07-02 DIAGNOSIS — I2510 Atherosclerotic heart disease of native coronary artery without angina pectoris: Secondary | ICD-10-CM

## 2013-07-02 DIAGNOSIS — Z9861 Coronary angioplasty status: Secondary | ICD-10-CM

## 2013-07-02 NOTE — Patient Instructions (Addendum)
Your physician recommends that you continue on your current medications as directed. Please refer to the Current Medication list given to you today.     

## 2013-07-02 NOTE — Progress Notes (Signed)
ELECTROPHYSIOLOGY OFFICE NOTE  Patient ID: Kristine [name] MRN: [id number], DOB/AGE: 01-20-1941   Date of Visit: 07/02/2013  Primary Physician: Lucilla Edin, MD Primary Cardiologist: Excell Seltzer, MD Reason for Visit: Hospital follow-up  History of Present Illness  Kristine [name] is a 72 y.o. female with CAD s/p recent acute lateral STEMI and DES-LCx on 06/21/13. She was discharged on 06/24/13. She has a history of CAD, scleroderma/CREST syndrome, HTN, HLD (elevated LFTs on statins previously) and GERD.   Cardiac cath 7/28: 20% left main, 30-40% mid LAD, occluded small diagonal with left to left collaterals, 90% first obtuse marginal and 50-60% mid right coronary artery. Ejection fraction 35-40%. Patient had PCI of her obtuse marginal with a drug-eluting stent.   Follow-up 2D echo: EF 50-55% and apical HK, trace AI.   She had felt generally well post-discharge. She had been compliant with DAPT. The morning of 06/26/13, she awoke at 1:00 AM with pain the R breast area described as stabbing lasting several seconds and resolving spontaneously. She then began experiencing a dull pain in her R scapular area. No pleuritic, positional components. Not reminiscent of prior MI. Noted to be tender on palpation by EMT. She was transported to the ED.   There, EKG revealed old RBBB, no evidence of ischemia. Initial trop-I 2.17, felt to be appropriately down-trending from recent MI. CXR w/o acute abnormalities. CMET and CBC unremarkable. Although the patient's chest pain was atypical, the plan was made to observe overnight and trend cardiac biomarkers to exclude stent thrombosis or re-infarction. Troponin trend: 1.58=>1.03=>1.10=>1.12 consistent with an appropriate down-trend. She was discharged the next day to follow-up today. She was resumed on all outpatient medications. Her chest pain was suspected to be due to musculoskeletal etiologies.    Since discharge, she reports she is doing well and has no complaints. She denies  chest pain or shortness of breath. She denies palpitations, dizziness, near syncope or syncope. She denies LE swelling, orthopnea, PND or recent weight gain. She stopped taking Crestor due to severe abdominal "cramping" as directed by Dr. Excell Seltzer otherwise she is compliant and tolerating medications without difficulty.  Past Medical History Past Medical History  Diagnosis Date  . GERD (gastroesophageal reflux disease)   . Barrett's esophagus     Stage B  . Cystocele   . Rectocele     mild  . Hypertension   . Diverticulitis   . Arthritis   . Raynaud disease   . CREST variant of scleroderma   . Osteopenia 09/2012    T score -2.4 FRAX 13%/2.7%  per 10/15/2012 discussion plan repeat in 2 years  . Anemia   . STEMI (ST elevation myocardial infarction) 06/21/2013  . CAD (coronary artery disease)   . Hyperlipidemia     Past Surgical History Past Surgical History  Procedure Laterality Date  . Tonsillectomy  1950  . Tubal ligation  1980  . Nissen fundoplication  2006  . Total knee arthroplasty      right  . Joint replacement  9/10    rt total  . Foot surgery  2013    x2  . Abdominal hysterectomy  1996    RSO for menorrhagia.  . Coronary angioplasty with stent placement  06/21/2013    Allergies/Intolerances Allergies  Allergen Reactions  . Aspirin Other (See Comments)    "Irritating to GERD"  . Nsaids Other (See Comments)    GERD hx  . Statins Other (See Comments)    ELEVATED LFT'S   Current Home Medications  Current Outpatient Prescriptions  Medication Sig Dispense Refill  . aspirin EC 81 MG EC tablet Take 1 tablet (81 mg total) by mouth daily.      . carvedilol (COREG) 6.25 MG tablet Take 1 tablet (6.25 mg total) by mouth 2 (two) times daily with a meal.  60 tablet  11  . lisinopril (PRINIVIL,ZESTRIL) 10 MG tablet Take 1 tablet (10 mg total) by mouth daily.  90 tablet  3  . Multiple Vitamin (MULTIVITAMIN WITH MINERALS) TABS Take 1 tablet by mouth daily.      . nitroGLYCERIN  (NITROSTAT) 0.4 MG SL tablet Place 1 tablet (0.4 mg total) under the tongue every 5 (five) minutes x 3 doses as needed for chest pain.  25 tablet  12  . omeprazole (PRILOSEC) 20 MG capsule Take 20 mg by mouth 2 (two) times daily.        . Prenatal Vit-Fe Fumarate-FA [facility] CORE NUTRITION PO) Take by mouth daily.      . Ticagrelor (BRILINTA) 90 MG TABS tablet Take 1 tablet (90 mg total) by mouth 2 (two) times daily.  60 tablet  11   No current facility-administered medications for this visit.   Social History History   Social History  . Marital Status: Divorced    Spouse Name: N/A    Number of Children: N/A  . Years of Education: N/A   Occupational History  . Not on file.   Social History Main Topics  . Smoking status: Never Smoker   . Smokeless tobacco: Never Used  . Alcohol Use: Yes     Comment: Occasional  . Drug Use: No  . Sexually Active: No   Other Topics Concern  . Not on file   Social History Narrative   Exercise dancing 3 x times weekly for 1-2 hours    Review of Systems General: No chills, fever, night sweats or weight changes Cardiovascular: No chest pain, dyspnea on exertion, edema, orthopnea, palpitations, paroxysmal nocturnal dyspnea Dermatological: No rash, lesions or masses Respiratory: No cough, dyspnea Urologic: No hematuria, dysuria Abdominal: No nausea, vomiting, diarrhea, bright red blood per rectum, melena, or hematemesis Neurologic: No visual changes, weakness, changes in mental status All other systems reviewed and are otherwise negative except as noted above.  Physical Exam Vitals: Blood pressure 116/68, pulse 76, height 5\' 1"  (1.549 m), weight 140 lb 6.4 oz (63.685 kg), last menstrual period 11/25/1994, SpO2 99.00%.  General: Well developed, well appearing 72 y.o. female in no acute distress. HEENT: Normocephalic, atraumatic. EOMs intact. Sclera nonicteric. Oropharynx clear.  Neck: Supple. No JVD. Lungs: Respirations regular and unlabored,  CTA bilaterally. No wheezes, rales or rhonchi. Heart: RRR. S1, S2 present. No murmurs, rub, S3 or S4. Abdomen: Soft, non-distended.  Extremities: No clubbing, cyanosis or edema. DP/PT/Radials 2+ and equal bilaterally. Psych: Normal affect. Neuro: Alert and oriented X 3. Moves all extremities spontaneously.   Assessment and Plan 1. CAD s/p recent STEMI and PCI/DES to LCx  - stable without anginal symptoms  - continue medical therapy including ASA, Brilinta, BB (not able to tolerate statins, h/o elevated LFTs and abdominal cramping)  - counseled regarding secondary prevention with lifestyle changes, low fat, low cholesterol diet and exercise  - discussed with Dr. Excell Seltzer - Ms. Friedlander is able to start cardiac rehab and return to work  - follow-up with Dr. Excell Seltzer in 4-6 weeks 2. Preserved LV function 3. HTN  - stable; normotensive  - continue current regimen  Signed, Tifany Hirsch, PA-C 07/02/2013, 10:54 AM

## 2013-07-02 NOTE — Telephone Encounter (Signed)
New prob  Pt states she just had a form filled out but it is some places on it that was not checked. She wants to speak with a nurse.

## 2013-07-02 NOTE — Telephone Encounter (Signed)
Will forward call to [place] who saw the pt today

## 2013-07-05 MED ORDER — CARVEDILOL 3.125 MG PO TABS: 3.1250 mg | ORAL_TABLET | Freq: Two times a day (BID) | ORAL | Status: DC

## 2013-07-05 NOTE — Telephone Encounter (Signed)
Spoke with patient who states she is trying to increase her activity daily as instructed and is experiencing severe fatigue with the increased activity. Patient states she can walk 10 minutes comfortably but is unable to walk for the 15 that was suggested. I advised patient to increase walking time from 10 minutes by 1-2 minutes daily.  Patient is concerned about returning to work next Monday.  She states that there is no elevator at work and she will have to walk up 2 flights of stairs to her office and then will work a full day and have to do rehab in addition.  Patient would like Dr. Earmon Phoenix advice regarding this.  I advised patient that Dr. Excell Seltzer is not in the office today but that I will route message to him and our office will advise her of his advice sometime this week.  Patient verbalized agreement with plan of care.  Patient also asked that Dr. Excell Seltzer reply regarding the length of time she is eligible for the handicapped parking tag - she states [employer], PA-C filled out the paperwork but did not mark a time frame which can be temporary, permanent, or for a designated length of time.

## 2013-07-05 NOTE — Telephone Encounter (Addendum)
Spoke with patient to advise her of Dr. Earmon Phoenix advice to decrease Carvedilol to 3.125 mg BID.  Patient asked that a new Rx be sent to her pharmacy which was done.  I also advised patient of Dr. Earmon Phoenix advice to remain out of work 2 additional weeks.  I advised patient that if paperwork needs additional signatures or changes to bring it or fax it to office, that Dr. Excell Seltzer will be in clinic on Wednesday and Friday morning.  Patient verbalized understanding.  Patient aware of Dr. Earmon Phoenix recommendation of 6 months for handicapped tag.  Patient verbalized understanding and agreement with plan of care and verbalized gratitude.

## 2013-07-05 NOTE — Telephone Encounter (Signed)
Follow Up      Pt feels she will not be able to return to work as scheduled on her form due to still feeling very fatigue after walking. Pt would like to speak to nurse regarding this.

## 2013-07-05 NOTE — Telephone Encounter (Signed)
Spoke with patient who would like advice regarding an urinary tract infection.  I advised patient to call her PCP or GYN.  Patient verbalized understanding and agreement with plan.

## 2013-07-05 NOTE — Telephone Encounter (Signed)
Would decrease carvedilol to 3.125 mg BID, ok to hold out of work for an additional 2 weeks, and handicap tag should be 6 months. If she needs it for longer I would be happy to do a permanent tag but hopefully will not require this

## 2013-07-05 NOTE — Telephone Encounter (Signed)
Follow up  Pt would like to speak with you again.

## 2013-07-12 ENCOUNTER — Telehealth: Payer: Self-pay | Admitting: Cardiovascular Disease

## 2013-07-12 NOTE — Telephone Encounter (Signed)
Pt following up on FMLA paperwork, pls call with status and if we can expedite  It in anyway

## 2013-07-13 NOTE — Telephone Encounter (Signed)
Dr Excell Seltzer has completed FMLA forms and I have placed them in medical records.

## 2013-07-14 NOTE — Telephone Encounter (Signed)
Pt called because she states in the FMLA forms faxed to [facility]; the return to work date is wrong. Pt states when she had the OV with Shon Baton the date to return to work was 8/18,after talking with Julieta Gutting RN, Dr. Excell Seltzer had said that pt needed to return to work on 07/27/13. I did not find a note with this information in pt's chart.  Pt states that the revise ending date of the FMLA form  should be 07/26/13 in the continue section, also need date and initial, and the letter also needs date and initials.

## 2013-07-14 NOTE — Telephone Encounter (Signed)
FMLA Faxed to [facility], Pt Aware 07/14/13/KM

## 2013-07-14 NOTE — Telephone Encounter (Signed)
New Problem:  Pt states fmla papers were faxed/// the return to work date was wrong.. Pt asks that you call her back to discuss all of the changes that need to be made.

## 2013-07-15 NOTE — Telephone Encounter (Signed)
I spoke with the pt and she said Dr Excell Seltzer approved her staying out of work 2 additional weeks from 07/12/13.  I did find this documentation in the 07/02/13 telephone encounter.  I have changed the pt's FMLA forms to reflect the pt returning to work on 07/27/13.

## 2013-07-15 NOTE — Telephone Encounter (Signed)
Follow up  Pt is calling regarding her FMLA paperwork and when she is supposed to return to work.

## 2013-07-19 ENCOUNTER — Telehealth: Payer: Self-pay | Admitting: Cardiovascular Disease

## 2013-07-19 NOTE — Telephone Encounter (Signed)
New problem   Pt need an order for cardiac rehab sent to [facility] you can call and get fax from [facility] [phone].

## 2013-07-19 NOTE — Telephone Encounter (Signed)
Spoke with patient. Pt aware I am forwarding to Dr Excell Seltzer for follow-up tomorrow.

## 2013-07-20 NOTE — Telephone Encounter (Signed)
Left message for cardiac rehab [phone]) to call back and fax order.

## 2013-07-21 ENCOUNTER — Emergency Department (HOSPITAL_BASED_OUTPATIENT_CLINIC_OR_DEPARTMENT_OTHER): Payer: PRIVATE HEALTH INSURANCE

## 2013-07-21 ENCOUNTER — Encounter (HOSPITAL_BASED_OUTPATIENT_CLINIC_OR_DEPARTMENT_OTHER): Payer: Self-pay

## 2013-07-21 ENCOUNTER — Emergency Department (HOSPITAL_BASED_OUTPATIENT_CLINIC_OR_DEPARTMENT_OTHER)
Admission: EM | Admit: 2013-07-21 | Discharge: 2013-07-21 | Disposition: A | Discharge status: Home / Self Care | Payer: PRIVATE HEALTH INSURANCE | Attending: Emergency Medicine | Admitting: Emergency Medicine

## 2013-07-21 DIAGNOSIS — Z8679 Personal history of other diseases of the circulatory system: Secondary | ICD-10-CM | POA: Insufficient documentation

## 2013-07-21 DIAGNOSIS — Z9861 Coronary angioplasty status: Secondary | ICD-10-CM | POA: Insufficient documentation

## 2013-07-21 DIAGNOSIS — Z8739 Personal history of other diseases of the musculoskeletal system and connective tissue: Secondary | ICD-10-CM | POA: Insufficient documentation

## 2013-07-21 DIAGNOSIS — I2510 Atherosclerotic heart disease of native coronary artery without angina pectoris: Secondary | ICD-10-CM | POA: Insufficient documentation

## 2013-07-21 DIAGNOSIS — Z8719 Personal history of other diseases of the digestive system: Secondary | ICD-10-CM | POA: Insufficient documentation

## 2013-07-21 DIAGNOSIS — D649 Anemia, unspecified: Secondary | ICD-10-CM | POA: Insufficient documentation

## 2013-07-21 DIAGNOSIS — I10 Essential (primary) hypertension: Secondary | ICD-10-CM | POA: Insufficient documentation

## 2013-07-21 DIAGNOSIS — I252 Old myocardial infarction: Secondary | ICD-10-CM | POA: Insufficient documentation

## 2013-07-21 DIAGNOSIS — K219 Gastro-esophageal reflux disease without esophagitis: Secondary | ICD-10-CM | POA: Insufficient documentation

## 2013-07-21 DIAGNOSIS — R0789 Other chest pain: Secondary | ICD-10-CM

## 2013-07-21 DIAGNOSIS — Z8639 Personal history of other endocrine, nutritional and metabolic disease: Secondary | ICD-10-CM | POA: Insufficient documentation

## 2013-07-21 DIAGNOSIS — R5381 Other malaise: Secondary | ICD-10-CM | POA: Insufficient documentation

## 2013-07-21 DIAGNOSIS — Z7982 Long term (current) use of aspirin: Secondary | ICD-10-CM | POA: Insufficient documentation

## 2013-07-21 DIAGNOSIS — M129 Arthropathy, unspecified: Secondary | ICD-10-CM | POA: Insufficient documentation

## 2013-07-21 DIAGNOSIS — Z862 Personal history of diseases of the blood and blood-forming organs and certain disorders involving the immune mechanism: Secondary | ICD-10-CM | POA: Insufficient documentation

## 2013-07-21 DIAGNOSIS — Z79899 Other long term (current) drug therapy: Secondary | ICD-10-CM | POA: Insufficient documentation

## 2013-07-21 DIAGNOSIS — Z8742 Personal history of other diseases of the female genital tract: Secondary | ICD-10-CM | POA: Insufficient documentation

## 2013-07-21 LAB — CBC WITH DIFFERENTIAL/PLATELET
Basophils Absolute: 0 10*3/uL (ref 0.0–0.1)
Eosinophils Relative: 6 % — ABNORMAL HIGH (ref 0–5)
Lymphocytes Relative: 25 % (ref 12–46)
MCV: 88.1 fL (ref 78.0–100.0)
Neutrophils Relative %: 57 % (ref 43–77)
Platelets: 165 10*3/uL (ref 150–400)
RDW: 14.9 % (ref 11.5–15.5)
WBC: 8 10*3/uL (ref 4.0–10.5)

## 2013-07-21 LAB — BASIC METABOLIC PANEL
CO2: 25 mEq/L (ref 19–32)
Calcium: 10.3 mg/dL (ref 8.4–10.5)
GFR calc non Af Amer: 86 mL/min — ABNORMAL LOW (ref >90)
Sodium: 140 mEq/L (ref 135–145)

## 2013-07-21 LAB — TROPONIN I: Troponin I: <0.3 ng/mL (ref <0.30)

## 2013-07-21 NOTE — ED Notes (Signed)
Pt reports onset of chest pain that started while pt was lying on the sofa.  Pain is intermittent, under left breast, radiating to back and neck.  Denies pain at present time.

## 2013-07-21 NOTE — Telephone Encounter (Signed)
Kristine [name] from [facility] states that she received a referral for Cardiac Rehab for this pt. She states that she is unable to complete this request/ She asks that you call her back to discuss.

## 2013-07-21 NOTE — ED Notes (Signed)
MD at bedside. 

## 2013-07-21 NOTE — ED Provider Notes (Signed)
CSN: [id number]     Arrival date & time 07/21/13  1422 History   First MD Initiated Contact with Patient 07/21/13 1510     Chief Complaint  Patient presents with  . Chest Pain   (Consider location/radiation/quality/duration/timing/severity/associated sxs/prior Treatment) Patient is a 72 y.o. female presenting with chest pain. The history is provided by the patient.  Chest Pain Associated symptoms: fatigue   Associated symptoms: no back pain, no diaphoresis, no fever and no headache    patient is followed by Dr. Excell Seltzer from [facility] cardiology. Patient status post stent placement July 28 following an MI. Patient was readmitted for left shoulder chest pain on August 2 which was ruled out for any cardiac event. Patient today at around noon started having sharp shooting pain in the left shoulder area to the back and occasionally to the front pains intermittent last only seconds no ache or discomfort or pressure in between. Patient describes the pain as a jabbing kind of pain. No shortness of breath no diaphoresis no nausea no vomiting. Pain when present is quite sharp she describes it as an 8-10 out of 10 no discomfort in between none now. Patient does sometimes is pain free for 45 minutes. Patient did try one nitroglycerin at home without any significant change. Patient has had persistent feeling of fatigue since the MI in July. This is not like her chest pain that she had with the MI that was a pressure sensation. It was constant.  Past Medical History  Diagnosis Date  . GERD (gastroesophageal reflux disease)   . Barrett's esophagus     Stage B  . Cystocele   . Rectocele     mild  . Hypertension   . Diverticulitis   . Arthritis   . Raynaud disease   . CREST variant of scleroderma   . Osteopenia 09/2012    T score -2.4 FRAX 13%/2.7%  per 10/15/2012 discussion plan repeat in 2 years  . Anemia   . STEMI (ST elevation myocardial infarction) 06/21/2013  . CAD (coronary artery disease)   .  Hyperlipidemia    Past Surgical History  Procedure Laterality Date  . Tonsillectomy  1950  . Tubal ligation  1980  . Nissen fundoplication  2006  . Total knee arthroplasty      right  . Joint replacement  9/10    rt total  . Foot surgery  2013    x2  . Abdominal hysterectomy  1996    RSO for menorrhagia.  . Coronary angioplasty with stent placement  06/21/2013   Family History  Problem Relation Age of Onset  . Cancer Mother     Pancreatic Cancer  . Hepatitis C Mother   . Heart disease Father     congestive heart failure  . Panic disorder Daughter   . Asthma Maternal Grandmother   . Emphysema Maternal Grandmother   . Cancer Maternal Grandfather     lung   History  Substance Use Topics  . Smoking status: Never Smoker   . Smokeless tobacco: Never Used  . Alcohol Use: Yes     Comment: Occasional   OB History   Grav Para Term Preterm Abortions TAB SAB Ect Mult Living   1 1        1      Review of Systems  Constitutional: Positive for fatigue. Negative for fever and diaphoresis.  HENT: Negative for congestion and neck pain.   Eyes: Negative for redness.  Respiratory: Negative for chest tightness.  Cardiovascular: Positive for chest pain. Negative for leg swelling.  Genitourinary: Negative for dysuria.  Musculoskeletal: Negative for back pain.  Skin: Negative for rash.  Neurological: Negative for headaches.  Hematological: Does not bruise/bleed easily.  Psychiatric/Behavioral: Negative for confusion.    Allergies  Aspirin; Nsaids; and Statins  Home Medications   Current Outpatient Rx  Name  Route  Sig  Dispense  Refill  . nitroGLYCERIN (NITROSTAT) 0.4 MG SL tablet   Sublingual   Place 1 tablet (0.4 mg total) under the tongue every 5 (five) minutes x 3 doses as needed for chest pain.   25 tablet   12   . aspirin EC 81 MG EC tablet   Oral   Take 1 tablet (81 mg total) by mouth daily.         . carvedilol (COREG) 3.125 MG tablet   Oral   Take 1 tablet  (3.125 mg total) by mouth 2 (two) times daily.   180 tablet   3   . lisinopril (PRINIVIL,ZESTRIL) 10 MG tablet   Oral   Take 1 tablet (10 mg total) by mouth daily.   90 tablet   3     Rx has expired - no refills remain   . Multiple Vitamin (MULTIVITAMIN WITH MINERALS) TABS   Oral   Take 1 tablet by mouth daily.         Marland Kitchen omeprazole (PRILOSEC) 20 MG capsule   Oral   Take 20 mg by mouth 2 (two) times daily.           . Prenatal Vit-Fe Fumarate-FA [facility] CORE NUTRITION PO)   Oral   Take by mouth daily.         . Ticagrelor (BRILINTA) 90 MG TABS tablet   Oral   Take 1 tablet (90 mg total) by mouth 2 (two) times daily.   60 tablet   11    BP 128/73  Pulse 71  Temp(Src) 98.3 F (36.8 C) (Oral)  Resp 16  Ht 5\' 1"  (1.549 m)  Wt 138 lb (62.596 kg)  BMI 26.09 kg/m2  SpO2 99%  LMP 11/25/1994 Physical Exam  Nursing note and vitals reviewed. Constitutional: She is oriented to person, place, and time. She appears well-developed and well-nourished. No distress.  HENT:  Head: Normocephalic and atraumatic.  Mouth/Throat: Oropharynx is clear and moist.  Eyes: Conjunctivae and EOM are normal. Pupils are equal, round, and reactive to light.  Neck: Normal range of motion. Neck supple.  Cardiovascular: Normal rate, regular rhythm and normal heart sounds.   No murmur heard. Pulmonary/Chest: Effort normal and breath sounds normal.  Abdominal: Soft. Bowel sounds are normal. There is no tenderness.  Musculoskeletal: Normal range of motion.  Neurological: She is alert and oriented to person, place, and time. No cranial nerve deficit. She exhibits normal muscle tone. Coordination normal.  Skin: Skin is warm. No rash noted.    ED Course  Procedures (including critical care time) Labs Review Labs Reviewed  CBC WITH DIFFERENTIAL - Abnormal; Notable for the following:    Eosinophils Relative 6 (*)    All other components within normal limits  BASIC METABOLIC PANEL -  Abnormal; Notable for the following:    Glucose, Bld 111 (*)    GFR calc non Af Amer 86 (*)    All other components within normal limits  TROPONIN I   Results for orders placed during the hospital encounter of 07/21/13  CBC WITH DIFFERENTIAL      Result Value  Range   WBC 8.0  4.0 - 10.5 K/uL   RBC 4.61  3.87 - 5.11 MIL/uL   Hemoglobin 13.1  12.0 - 15.0 g/dL   HCT 16.1  09.6 - 04.5 %   MCV 88.1  78.0 - 100.0 fL   MCH 28.4  26.0 - 34.0 pg   MCHC 32.3  30.0 - 36.0 g/dL   RDW 40.9  81.1 - 91.4 %   Platelets 165  150 - 400 K/uL   Neutrophils Relative % 57  43 - 77 %   Neutro Abs 4.6  1.7 - 7.7 K/uL   Lymphocytes Relative 25  12 - 46 %   Lymphs Abs 2.0  0.7 - 4.0 K/uL   Monocytes Relative 12  3 - 12 %   Monocytes Absolute 1.0  0.1 - 1.0 K/uL   Eosinophils Relative 6 (*) 0 - 5 %   Eosinophils Absolute 0.5  0.0 - 0.7 K/uL   Basophils Relative 0  0 - 1 %   Basophils Absolute 0.0  0.0 - 0.1 K/uL  BASIC METABOLIC PANEL      Result Value Range   Sodium 140  135 - 145 mEq/L   Potassium 3.9  3.5 - 5.1 mEq/L   Chloride 103  96 - 112 mEq/L   CO2 25  19 - 32 mEq/L   Glucose, Bld 111 (*) 70 - 99 mg/dL   BUN 17  6 - 23 mg/dL   Creatinine, Ser 7.82  0.50 - 1.10 mg/dL   Calcium 95.6  8.4 - 21.3 mg/dL   GFR calc non Af Amer 86 (*) >90 mL/min   GFR calc Af Amer >90  >90 mL/min  TROPONIN I      Result Value Range   Troponin I <0.30  <0.30 ng/mL    Date: 07/21/2013  Rate: 75  Rhythm: normal sinus rhythm  QRS Axis: left  Intervals: normal  ST/T Wave abnormalities: normal  Conduction Disutrbances:none  Narrative Interpretation:   Old EKG Reviewed: unchanged No change in EKG compared to 06/26/2013. today's EKG in August 2 EKG does have inverted T waves in V1 and V2.  Imaging Review Dg Chest 2 View  07/21/2013   *RADIOLOGY REPORT*  Clinical Data: Chest pain and left shoulder pain.  History of coronary artery disease and myocardial infarction.  CHEST - 2 VIEW  Comparison: 06/26/2013   Findings: The heart size is stable and within normal limits.  Lungs show no evidence of pulmonary edema, infiltrate or nodule.  There is stable mild tortuosity of the thoracic aorta and a stable leftward convex scoliosis of the lower thoracic spine.  No pleural effusions are identified.  IMPRESSION: No active disease.   Original Report Authenticated By: Irish Lack, M.D.    MDM   1. Atypical chest pain    Discussed with Dr. Excell Seltzer [facility] cardiology he concurs his symptoms seem very atypical. She can be discharged home. For her fatigue he recommended stopping her Coreg. Patient given instructions on when to return for chest pain really needs to be some sort of constant discomfort or ache or pressure or sharp pain for about 15 minutes, also reminded patient of her nitroglycerin to take at home. After stopping the Coreg if her fatigue is not improved to make an appointment with cardiology.  Today's workup troponins negative EKG strip of the unchanged from August 2, chest x-rays negative for pneumonia pneumothorax or pulmonary edema. Patient's pain is very atypical intermittent sharp pain lasting just seconds no  constant ache or pressure or discomfort at all. Patient status post MI July 28 with stent. Chest discomfort may be related to the healing process. Patient was admitted on August 2 for a constant ache in the left scapular area and was ruled out as being cardiac related.    Shelda Jakes, MD 07/21/13 [id number]

## 2013-07-21 NOTE — Telephone Encounter (Signed)
LMTCB

## 2013-07-21 NOTE — Telephone Encounter (Signed)
Spoke with patient who states she is having intermittent stabbing pain in the left side of her back that radiates up to her shoulder.  Patient states the pain began at 1200 today.  I advised patient to take a NTG pill now and to check her BP.  I am remaining on the telephone with the patient waiting to determine if NTG helps pain.  Patient rates pain 8 out of 10 when it "sticks" but states pain comes and goes.  Patient's BP is 136/72, HR 82.  Patient states the pain is not associated with movement - states she was sitting on the couch when the pain began.  Patient states she cannot determine if the NTG helped because the pain is intermittent. Patient speaks clearly, denies SOB, denies n/v, diaphoresis.  Patient advised to go to the nearest hospital.  Patient states neighbor is driving her there now.

## 2013-07-21 NOTE — ED Notes (Signed)
Pt reports pain under left breast radiating to upper back and left side of neck is intermittent and described as a "sticking pain".  Previous MI with stent placement 1 month ago.  She states pain is not similar.  Pain free at present time.

## 2013-07-21 NOTE — ED Notes (Signed)
Patient transported to X-ray 

## 2013-07-21 NOTE — Telephone Encounter (Signed)
New problem   Pt is having stabbing pain in back on left side. Pt want to know what does she need to do because she had a heart attack a month ago. Please call pt.

## 2013-07-22 ENCOUNTER — Encounter: Payer: Self-pay | Admitting: Cardiovascular Disease

## 2013-07-22 NOTE — Telephone Encounter (Signed)
Left message for Kristine [name] from Cardiac Rehab to contact the office. I also reviewed the pt's chart and she did go to the ER as instructed. The pt was discharged with atypical symptoms.

## 2013-07-22 NOTE — Telephone Encounter (Signed)
LMTCB ./CY 

## 2013-07-22 NOTE — Telephone Encounter (Signed)
Cardiac rehab at [facility], checking on status of order

## 2013-07-22 NOTE — Telephone Encounter (Signed)
This encounter was created in error - please disregard.

## 2013-07-23 NOTE — Telephone Encounter (Signed)
Left message on machine for Kristine [name] at Cardiac Rehab to contact the office.

## 2013-07-27 NOTE — Telephone Encounter (Signed)
I spoke with Kristine [name] over the phone and gave her a verbal for this pt's cardiac rehab order.  She said that they are now on Epic but is unsure which location should be chosen for rehab.  She will have someone from cardiac rehab contact me with the correct information so that an order can be placed in Epic.

## 2013-07-27 NOTE — Telephone Encounter (Signed)
I spoke with Jonette Eva and she said that she is not the contact for Cardiac Rehab.  The correct contact is Junious Dresser [phone].

## 2013-07-30 ENCOUNTER — Telehealth: Payer: Self-pay | Admitting: Cardiovascular Disease

## 2013-07-30 NOTE — Telephone Encounter (Signed)
I spoke with the pt and gave her the contact number for Junious Dresser at Cardiac Rehab.   The pt also said that her employer will be sending new FMLA forms for completion.  The pt returned to work full time on 07/27/13 and this exhausted her.  Per her human resources department they have approved the pt to start working half days (starting 07/29/13) for the rest of this month and then return to full days in October. Will await new forms.

## 2013-07-30 NOTE — Telephone Encounter (Signed)
I have not received a call back in regards to placing an order in Epic.  I will close this encounter since a verbal order was given for rehab.

## 2013-07-30 NOTE — Telephone Encounter (Signed)
New problem    Status of cardiac rehab referral -  [place].

## 2013-08-03 ENCOUNTER — Encounter: Payer: Self-pay | Admitting: Cardiovascular Disease

## 2013-08-04 ENCOUNTER — Telehealth: Payer: Self-pay | Admitting: *Deleted

## 2013-08-04 ENCOUNTER — Encounter: Payer: Self-pay | Admitting: *Deleted

## 2013-08-04 NOTE — Telephone Encounter (Signed)
Chanetta Marshall from [facility] called stating that they needed a referral for patient. Phone number: [phone] and Fax number: [phone] (fax). Will send electronically.

## 2013-08-04 NOTE — Telephone Encounter (Signed)
Faxed to Chanetta Marshall at [facility], as it would not send electronically.

## 2013-08-04 NOTE — Telephone Encounter (Signed)
Faxed Cardiac Rehab order to Chanetta Marshall at [facility].

## 2013-08-04 NOTE — Progress Notes (Signed)
This encounter was created in error - please disregard.

## 2013-08-16 NOTE — Telephone Encounter (Signed)
This encounter was created in error - please disregard.

## 2013-08-18 ENCOUNTER — Encounter: Payer: Self-pay | Admitting: Cardiovascular Disease

## 2013-08-18 ENCOUNTER — Ambulatory Visit (INDEPENDENT_AMBULATORY_CARE_PROVIDER_SITE_OTHER): Payer: PRIVATE HEALTH INSURANCE | Admitting: Cardiovascular Disease

## 2013-08-18 VITALS — BP 119/60 | HR 92 | Wt 136.0 lb

## 2013-08-18 DIAGNOSIS — I2510 Atherosclerotic heart disease of native coronary artery without angina pectoris: Secondary | ICD-10-CM

## 2013-08-18 NOTE — Patient Instructions (Addendum)
Your physician recommends that you schedule a follow-up appointment in: 3 MONTHS with Dr Cooper  Your physician recommends that you continue on your current medications as directed. Please refer to the Current Medication list given to you today.  

## 2013-08-18 NOTE — Progress Notes (Signed)
HPI:   72 year old woman presenting for followup evaluation. The patient has coronary artery disease and she presented with an acute lateral ST elevation MI 06/21/2013. She underwent PCI to left circumflex with a drug-eluting stent. She had some residual CAD with medical therapy recommended. She was initially seen in hospital followup on August 8. She presents today for further evaluation.  Her cardiac catheterization demonstrated occlusion of the small diagonal branch with left to left collaterals. There is 90% stenosis in the first obtuse marginal branch and this was treated with PCI. The right coronary artery had moderate 50-60% stenosis. The LAD was widely patent as was the left main. The patient's left ventricular ejection fraction was initially 35-40%. On followup echocardiography her ejection fraction had improved to 50-55%.  She complains primarily of fatigue. She denies chest pain, shortness of breath, or palpitations. She is about to begin cardiac rehabilitation and she is looking forward to this. She's had no bleeding problems. She's had no recurrence of chest pain.  Outpatient Encounter Prescriptions as of 08/18/2013  Medication Sig Dispense Refill  . aspirin EC 81 MG EC tablet Take 1 tablet (81 mg total) by mouth daily.      Marland Kitchen lisinopril (PRINIVIL,ZESTRIL) 10 MG tablet Take 1 tablet (10 mg total) by mouth daily.  90 tablet  3  . Multiple Vitamin (MULTIVITAMIN WITH MINERALS) TABS Take 1 tablet by mouth daily.      . nitroGLYCERIN (NITROSTAT) 0.4 MG SL tablet Place 1 tablet (0.4 mg total) under the tongue every 5 (five) minutes x 3 doses as needed for chest pain.  25 tablet  12  . omeprazole (PRILOSEC) 20 MG capsule Take 20 mg by mouth 2 (two) times daily.        . Prenatal Vit-Fe Fumarate-FA [facility] CORE NUTRITION PO) Take by mouth daily.      . Ticagrelor (BRILINTA) 90 MG TABS tablet Take 1 tablet (90 mg total) by mouth 2 (two) times daily.  60 tablet  11   No  facility-administered encounter medications on file as of 08/18/2013.    Allergies  Allergen Reactions  . Aspirin Other (See Comments)    "Irritating to GERD"  . Nsaids Other (See Comments)    GERD hx  . Statins Other (See Comments)    ELEVATED LFT'S    Past Medical History  Diagnosis Date  . GERD (gastroesophageal reflux disease)   . Barrett's esophagus     Stage B  . Cystocele   . Rectocele     mild  . Hypertension   . Diverticulitis   . Arthritis   . Raynaud disease   . CREST variant of scleroderma   . Osteopenia 09/2012    T score -2.4 FRAX 13%/2.7%  per 10/15/2012 discussion plan repeat in 2 years  . Anemia   . STEMI (ST elevation myocardial infarction) 06/21/2013  . CAD (coronary artery disease)   . Hyperlipidemia     ROS: Negative except as per HPI  BP 119/60  Pulse 92  Wt 136 lb (61.689 kg)  BMI 25.71 kg/m2  LMP 11/25/1994  PHYSICAL EXAM: Pt is alert and oriented, NAD HEENT: normal Neck: JVP - normal, carotids 2+= without bruits Lungs: CTA bilaterally CV: RRR without murmur or gallop Abd: soft, NT, Positive BS, no hepatomegaly Ext: no C/C/E, distal pulses intact and equal Skin: warm/dry no rash  ASSESSMENT AND PLAN: 1. Coronary artery disease, native vessel. The patient will remain on dual antiplatelet therapy with aspirin and brilinta for one  year following her coronary event. No medication changes were made today. I think cardiac rehabilitation will really help her.  2. Hyperlipidemia. She is intolerant to lipid-lowering therapies. Will continue with lifestyle modification. She's lost 7 pounds since leaving the hospital. She's been very careful about what she is eating.  3. Hypertension. She continues on lisinopril with good control of her blood pressure   I'll see her back in 3 months for followup.  Tonny Bollman 08/18/2013 6:28 PM

## 2013-08-22 ENCOUNTER — Other Ambulatory Visit: Payer: Self-pay | Admitting: Emergency Medicine

## 2013-08-22 ENCOUNTER — Ambulatory Visit (INDEPENDENT_AMBULATORY_CARE_PROVIDER_SITE_OTHER): Payer: PRIVATE HEALTH INSURANCE | Admitting: Emergency Medicine

## 2013-08-22 VITALS — BP 122/74 | HR 105 | Temp 98.6°F | Resp 16 | Ht 61.5 in | Wt 135.6 lb

## 2013-08-22 DIAGNOSIS — Z23 Encounter for immunization: Secondary | ICD-10-CM

## 2013-08-22 DIAGNOSIS — I25810 Atherosclerosis of coronary artery bypass graft(s) without angina pectoris: Secondary | ICD-10-CM

## 2013-08-22 DIAGNOSIS — R5381 Other malaise: Secondary | ICD-10-CM

## 2013-08-22 DIAGNOSIS — R5383 Other fatigue: Secondary | ICD-10-CM

## 2013-08-22 LAB — POCT CBC
HCT, POC: 40.5 % (ref 37.7–47.9)
Hemoglobin: 12.5 g/dL (ref 12.2–16.2)
Lymph, poc: 2.5 (ref 0.6–3.4)
MCH, POC: 28.2 pg (ref 27–31.2)
MCHC: 30.9 g/dL — AB (ref 31.8–35.4)
MCV: 91.2 fL (ref 80–97)
WBC: 7.6 10*3/uL (ref 4.6–10.2)

## 2013-08-22 NOTE — Progress Notes (Signed)
  Subjective:    Patient ID: Kristine [name], female    DOB: 11/24/1941, 72 y.o.   MRN: [id number]  HPI  72 y.o. Female presents to clinic 6-7 weeks post heart attack. Complains of fatigue. States that she feels tierd all the time. Has been working short shifts at work and it returning to full duty this week. Dr. Copper has patient returned for 8 hours shifts; felt exhausted after these days. Was put on 4 hours days per human resources at her company. Returns to 8 hours shifts tomorrow. Is also attending cardiac rehab 4:45-6pm. With her commute the long days are bothering her.  Review of Systems     Objective:   Physical Exam HEENT exam is unremarkable. Neck is supple. Chest is clear to auscultation and percussion. Cardiac exam reveals a regular rate without murmurs rubs or gallops.  Results for orders placed in visit on 08/22/13  POCT CBC      Result Value Range   WBC 7.6  4.6 - 10.2 K/uL   Lymph, poc 2.5  0.6 - 3.4   POC LYMPH PERCENT 32.4  10 - 50 %L   MID (cbc) 0.6  0 - 0.9   POC MID % 8.0  0 - 12 %M   POC Granulocyte 4.5  2 - 6.9   Granulocyte percent 59.6  37 - 80 %G   RBC 4.44  4.04 - 5.48 M/uL   Hemoglobin 12.5  12.2 - 16.2 g/dL   HCT, POC 62.1  30.8 - 47.9 %   MCV 91.2  80 - 97 fL   MCH, POC 28.2  27 - 31.2 pg   MCHC 30.9 (*) 31.8 - 35.4 g/dL   RDW, POC 65.7     Platelet Count, POC 185  142 - 424 K/uL   MPV 9.0  0 - 99.8 fL        Assessment & Plan:  Will give patient another month on for a days. She is 72 years old and still suffering from fatigue. She is going to rehabilitation at the present time. I do not feel she is physically able to do a full 8 hour day at the present time. Hopefully with another month of rehabilitation she will be ready for regular duty.

## 2013-08-23 LAB — BASIC METABOLIC PANEL
BUN: 17 mg/dL (ref 6–23)
Creat: 0.73 mg/dL (ref 0.50–1.10)
Potassium: 4.2 mEq/L (ref 3.5–5.3)

## 2013-08-23 LAB — TSH: TSH: 1.236 u[IU]/mL (ref 0.350–4.500)

## 2013-08-25 LAB — LIPID PANEL
Total CHOL/HDL Ratio: 5.4 Ratio
VLDL: 34 mg/dL (ref 0–40)

## 2013-08-30 ENCOUNTER — Ambulatory Visit (INDEPENDENT_AMBULATORY_CARE_PROVIDER_SITE_OTHER): Payer: PRIVATE HEALTH INSURANCE | Admitting: Emergency Medicine

## 2013-08-30 VITALS — BP 122/62 | HR 79 | Temp 98.5°F | Resp 18 | Ht 61.0 in | Wt 133.4 lb

## 2013-08-30 DIAGNOSIS — R5383 Other fatigue: Secondary | ICD-10-CM

## 2013-08-30 DIAGNOSIS — I2510 Atherosclerotic heart disease of native coronary artery without angina pectoris: Secondary | ICD-10-CM

## 2013-08-30 DIAGNOSIS — R5381 Other malaise: Secondary | ICD-10-CM

## 2013-08-30 NOTE — Progress Notes (Signed)
  Subjective:    Patient ID: Kristine [name], female    DOB: [birth year], 72 y.o.   MRN: [id number]  HPI HPI Comments: Kristine [name] is a 72 y.o. female who presents to the [facility] 8 weeks post heart attack and had a stent placed. Complains of fatigue. States that she feels tired after working a full day and would like to work shorter shifts instead because of her fatigue. She presents with her fmla papers and took off this past Friday b/c she did not feel well, she works with software at work.   Past Medical History  Diagnosis Date  . GERD (gastroesophageal reflux disease)   . Barrett's esophagus     Stage B  . Cystocele   . Rectocele     mild  . Hypertension   . Diverticulitis   . Arthritis   . Raynaud disease   . CREST variant of scleroderma   . Osteopenia 09/2012    T score -2.4 FRAX 13%/2.7%  per 10/15/2012 discussion plan repeat in 2 years  . Anemia   . STEMI (ST elevation myocardial infarction) 06/21/2013  . CAD (coronary artery disease)   . Hyperlipidemia    Past Surgical History  Procedure Laterality Date  . Tonsillectomy  1950  . Tubal ligation  1980  . Nissen fundoplication  2006  . Total knee arthroplasty      right  . Joint replacement  9/10    rt total  . Foot surgery  2013    x2  . Abdominal hysterectomy  1996    RSO for menorrhagia.  . Coronary angioplasty with stent placement  06/21/2013    Review of Systems  Constitutional: Negative for fever and chills.       Not feeling well and low on energy.   Respiratory: Negative for shortness of breath.   Gastrointestinal: Negative for nausea and vomiting.  Neurological: Negative for weakness.  All other systems reviewed and are negative.        Objective:   Physical Exam  Nursing note and vitals reviewed. Constitutional: She is oriented to person, place, and time. She appears well-developed and well-nourished. No distress.  HENT:  Head: Normocephalic and atraumatic.  Neck: No tracheal deviation present.   Cardiovascular: Normal rate, regular rhythm and normal heart sounds.  Exam reveals no gallop.   No murmur heard. Pulmonary/Chest: Effort normal. No respiratory distress.  Musculoskeletal: Normal range of motion.  Neurological: She is alert and oriented to person, place, and time.  Skin: Skin is warm and dry.  Psychiatric: She has a normal mood and affect. Her behavior is normal.  1104 AM manual BP: 124/70       Assessment & Plan:  Will give patient return to work note for 4 hours per day. She is 72 years old and still suffering from fatigue. Will return to office in a month for recheck.  I do not feel she is physically able to do a full 8 hour day at the present time.

## 2013-09-02 ENCOUNTER — Telehealth: Payer: Self-pay

## 2013-09-02 ENCOUNTER — Encounter: Payer: Self-pay | Admitting: Cardiovascular Disease

## 2013-09-02 NOTE — Telephone Encounter (Signed)
Pt of dr Cleta Alberts. States she is a pt at [facility] cardiac rehab and they want her to see a 'lipid specialist'. However, pt states they will not scheduled an appt without her most recent 'report'. I tried to clarify if they just needed labs or if they needed a referral, but the pt kept stating they need her most recent report.   Please call for clarification. Pt states she needs by Monday.   Best: [phone]  bf

## 2013-09-02 NOTE — Telephone Encounter (Signed)
New Problem:  Pt states she just missed a call from Lauren. Pt states it was in regards to some lab work Leotis Shames was to review with her

## 2013-09-02 NOTE — Telephone Encounter (Signed)
This encounter was created in error - please disregard.

## 2013-09-06 NOTE — Telephone Encounter (Signed)
Called patient to clarify what she needs. Left message for her to call me back. She called back she needs lab results these are at front desk for her to pick up.

## 2013-09-10 ENCOUNTER — Telehealth: Payer: Self-pay | Admitting: Cardiovascular Disease

## 2013-09-10 NOTE — Telephone Encounter (Signed)
New Problem:  Pt states she has questions for lauren regarding an upcoming procedure. Please advise

## 2013-09-10 NOTE — Telephone Encounter (Signed)
I spoke with the pt and she has been scheduled for her yearly surveillance endoscopy due to GERD and Barrett's esophagus.  This is scheduled in December.  They have instructed the pt to hold lisinopril and Brilinta prior to procedure.  I made the pt aware that at this time she cannot stop these medications due to her recent stents. The pt is going to call and cancel her procedure. The pt will follow-up with Dr Excell Seltzer in December as planned and we can discuss the need for endoscopy at that time.

## 2013-09-13 ENCOUNTER — Encounter: Payer: Self-pay | Admitting: Gynecology

## 2013-09-17 ENCOUNTER — Ambulatory Visit (INDEPENDENT_AMBULATORY_CARE_PROVIDER_SITE_OTHER): Payer: PRIVATE HEALTH INSURANCE | Admitting: Gynecology

## 2013-09-17 ENCOUNTER — Encounter: Payer: Self-pay | Admitting: Gynecology

## 2013-09-17 VITALS — BP 120/66 | Ht 61.0 in | Wt 131.0 lb

## 2013-09-17 DIAGNOSIS — Z0271 Encounter for disability determination: Secondary | ICD-10-CM

## 2013-09-17 DIAGNOSIS — N816 Rectocele: Secondary | ICD-10-CM

## 2013-09-17 DIAGNOSIS — M899 Disorder of bone, unspecified: Secondary | ICD-10-CM

## 2013-09-17 DIAGNOSIS — M8580 Other specified disorders of bone density and structure, unspecified site: Secondary | ICD-10-CM

## 2013-09-17 DIAGNOSIS — N8111 Cystocele, midline: Secondary | ICD-10-CM

## 2013-09-17 NOTE — Progress Notes (Signed)
Kristine [name] 1941/01/06 [id number]        72 y.o.  G1P1 for followup exam.  Had MI this past summer with stent placement. Doing well from this. History of cystocele enterocele. Had pessary but was bothersome and she took it out and has remained without it. Overall doing well without it.  Past medical history,surgical history, medications, allergies, family history and social history were all reviewed and documented in the EPIC chart.  ROS:  Performed and pertinent positives and negatives are included in the history, assessment and plan .  Exam: Kim assistant Filed Vitals:   09/17/13 1532  BP: 120/66  Height: 5\' 1"  (1.549 m)  Weight: 131 lb (59.421 kg)   General appearance  Normal Skin grossly normal Head/Neck normal with no cervical or supraclavicular adenopathy thyroid normal Lungs  clear Cardiac RR, without RMG Abdominal  soft, nontender, without masses, organomegaly or hernia Breasts  examined lying and sitting without masses, retractions, discharge or axillary adenopathy. Pelvic  Ext/BUS/vagina  atrophic changes with second degree cystocele at level of introital opening. Cuff does appear to be well supported. Weakness in the rectovaginal septum but no significant rectocele  Adnexa  Without masses or tenderness    Anus and perineum  normal   Rectovaginal  normal sphincter tone without palpated masses or tenderness.    Assessment/Plan:  72 y.o. G1P1 female for annual exam.   1. Postmenopausal. Without significant hot flashes night sweats vaginal dryness. Is not sexually active. 2. Cystocele. Had pessary before but did not like the way it felt that she removed it herself and has done without it. Offered to refit her with a different size but she declines at this time and would prefer just to monitor. She's not having any significant urinary symptoms. She does have some mild weakness in the rectovaginal septum but no overt rectocele at this time. Also cuff appears to be well supported  at this time without overt enterocele. Will monitor at this time. She'll followup if she wants retrial of the pessary. Discussed surgical referral to urology possible mesh but given her recent MI will postpone at least a year. 3. Osteopenia. DEXA 09/2012 with T score -2.4. FRAX 13%/2.7%. Per 10/15/2012 discussion patient not interested in treatment but prefers expectant management with repeat DEXA a 2 year interval. Plan repeat DEXA next year. Increase calcium vitamin D reviewed. 4. Mammography 06/2012. She plans to have done next month through work. SBE monthly reviewed. 5. Colonoscopy 2013. Repeat at their recommended interval. 6. Pap smear 2012. No Pap smear done today. No history of significant abnormal Pap smears. Status post hysterectomy for benign indications. At the age of 45. Options to stop screening altogether reviewed. We'll readdress on an annual basis. 7. Health maintenance. No blood work done as this is done through her primary physician's office. Followup one year, sooner as needed.  Note: This document was prepared with digital dictation and possible smart phrase technology. Any transcriptional errors that result from this process are unintentional.   Dara Lords MD, 4:27 PM 09/17/2013

## 2013-09-17 NOTE — Patient Instructions (Signed)
Followup in 1 year. Sooner if you want to retry the pessary.

## 2013-09-23 ENCOUNTER — Ambulatory Visit: Payer: PRIVATE HEALTH INSURANCE | Admitting: Cardiovascular Disease

## 2013-09-27 ENCOUNTER — Encounter: Payer: Self-pay | Admitting: Gynecology

## 2013-10-15 ENCOUNTER — Encounter: Payer: Self-pay | Admitting: Emergency Medicine

## 2013-10-16 ENCOUNTER — Encounter (HOSPITAL_COMMUNITY): Payer: Self-pay | Admitting: Emergency Medicine

## 2013-10-16 ENCOUNTER — Emergency Department (HOSPITAL_COMMUNITY)
Admission: EM | Admit: 2013-10-16 | Discharge: 2013-10-16 | Disposition: A | Discharge status: Home / Self Care | Payer: PRIVATE HEALTH INSURANCE | Attending: Emergency Medicine | Admitting: Emergency Medicine

## 2013-10-16 DIAGNOSIS — M549 Dorsalgia, unspecified: Secondary | ICD-10-CM | POA: Insufficient documentation

## 2013-10-16 DIAGNOSIS — Z8669 Personal history of other diseases of the nervous system and sense organs: Secondary | ICD-10-CM | POA: Insufficient documentation

## 2013-10-16 DIAGNOSIS — R42 Dizziness and giddiness: Secondary | ICD-10-CM | POA: Insufficient documentation

## 2013-10-16 DIAGNOSIS — M791 Myalgia, unspecified site: Secondary | ICD-10-CM

## 2013-10-16 DIAGNOSIS — M25519 Pain in unspecified shoulder: Secondary | ICD-10-CM | POA: Insufficient documentation

## 2013-10-16 DIAGNOSIS — I10 Essential (primary) hypertension: Secondary | ICD-10-CM | POA: Insufficient documentation

## 2013-10-16 DIAGNOSIS — M25512 Pain in left shoulder: Secondary | ICD-10-CM

## 2013-10-16 DIAGNOSIS — E785 Hyperlipidemia, unspecified: Secondary | ICD-10-CM | POA: Insufficient documentation

## 2013-10-16 DIAGNOSIS — Z8742 Personal history of other diseases of the female genital tract: Secondary | ICD-10-CM | POA: Insufficient documentation

## 2013-10-16 DIAGNOSIS — R079 Chest pain, unspecified: Secondary | ICD-10-CM

## 2013-10-16 DIAGNOSIS — Z9861 Coronary angioplasty status: Secondary | ICD-10-CM | POA: Insufficient documentation

## 2013-10-16 DIAGNOSIS — Z862 Personal history of diseases of the blood and blood-forming organs and certain disorders involving the immune mechanism: Secondary | ICD-10-CM | POA: Insufficient documentation

## 2013-10-16 DIAGNOSIS — I252 Old myocardial infarction: Secondary | ICD-10-CM | POA: Insufficient documentation

## 2013-10-16 DIAGNOSIS — M129 Arthropathy, unspecified: Secondary | ICD-10-CM | POA: Insufficient documentation

## 2013-10-16 DIAGNOSIS — R110 Nausea: Secondary | ICD-10-CM | POA: Insufficient documentation

## 2013-10-16 DIAGNOSIS — R0789 Other chest pain: Secondary | ICD-10-CM | POA: Insufficient documentation

## 2013-10-16 DIAGNOSIS — Z7982 Long term (current) use of aspirin: Secondary | ICD-10-CM | POA: Insufficient documentation

## 2013-10-16 DIAGNOSIS — Z79899 Other long term (current) drug therapy: Secondary | ICD-10-CM | POA: Insufficient documentation

## 2013-10-16 DIAGNOSIS — K219 Gastro-esophageal reflux disease without esophagitis: Secondary | ICD-10-CM | POA: Insufficient documentation

## 2013-10-16 DIAGNOSIS — I2510 Atherosclerotic heart disease of native coronary artery without angina pectoris: Secondary | ICD-10-CM | POA: Insufficient documentation

## 2013-10-16 LAB — POCT I-STAT TROPONIN I

## 2013-10-16 LAB — COMPREHENSIVE METABOLIC PANEL
ALT: 17 U/L (ref 0–35)
AST: 19 U/L (ref 0–37)
Albumin: 4 g/dL (ref 3.5–5.2)
CO2: 26 mEq/L (ref 19–32)
Calcium: 9.8 mg/dL (ref 8.4–10.5)
Chloride: 101 mEq/L (ref 96–112)
GFR calc non Af Amer: 85 mL/min — ABNORMAL LOW (ref >90)
Sodium: 136 mEq/L (ref 135–145)

## 2013-10-16 LAB — CK: Total CK: 54 U/L (ref 7–177)

## 2013-10-16 LAB — CBC
Platelets: 181 10*3/uL (ref 150–400)
RBC: 4.62 MIL/uL (ref 3.87–5.11)
WBC: 8.3 10*3/uL (ref 4.0–10.5)

## 2013-10-16 NOTE — ED Notes (Addendum)
Pt from home was driving to her dance lesson, started having generalized CP, lightheadedness,  with some back pain. Pt states that she had a MI and stent placement in July 2014. Pt denies diaphoresis, N/V, SOB. Pt is A&O and in NAD. Pt adds that she took a nitroglycerin at 13:00, PTA with some relief.

## 2013-10-16 NOTE — ED Provider Notes (Signed)
CSN: [id number]     Arrival date & time 10/16/13  1418 History   First MD Initiated Contact with Patient 10/16/13 1456     Chief Complaint  Patient presents with  . Chest Pain  . Dizziness    HPI  And-year-old female for history of coronary disease. Status post anterior wall MI in July of 2014. Treatment her PTCA. Had a culprit lesion 90% obtuse marginal off of her circumflex. Remainder of her cardiac disease with nonocclusive less than 50%. She's been doing well and is still going to cardiac rehabilitation. Continues her medications. Has a cholesterol of 250. Only subsequent hospitalization has been for elevated liver enzymes. This was attributed to a statin. This was stopped. She was restarted on new cholesterol medications 3 weeks ago. Doing well. Not jaundiced. No dark urine. Started having episodes yesterday morning. Intermittent. Describes it as a dullness in the left shoulder anteriorly. There has a bit in the right shoulder anteriorly  and posterior as well. These last less than 5 minutes at a time. She was on her way to dancing class today. Had an episode while driving. Canceled class and decided to come here. States this is in no way similar to the pain she felt with her MI. Described as "5000 pounds weight". Associated nausea. Is not dyspneic. Has not been diaphoretic. Performed her dancing in her cardiac rehabilitation without difficulty pain or limitation of her exertion.   Past Medical History  Diagnosis Date  . GERD (gastroesophageal reflux disease)   . Barrett's esophagus     Stage B  . Cystocele   . Rectocele     mild  . Hypertension   . Diverticulitis   . Arthritis   . Raynaud disease   . CREST variant of scleroderma   . Osteopenia 09/2012    T score -2.4 FRAX 13%/2.7%  per 10/15/2012 discussion plan repeat in 2 years  . Anemia   . STEMI (ST elevation myocardial infarction) 06/21/2013  . CAD (coronary artery disease)   . Hyperlipidemia    Past Surgical History   Procedure Laterality Date  . Tonsillectomy  1950  . Tubal ligation  1980  . Nissen fundoplication  2006  . Total knee arthroplasty      right  . Joint replacement  9/10    rt total  . Foot surgery  2013    x2  . Abdominal hysterectomy  1996    RSO for menorrhagia.  . Coronary angioplasty with stent placement  06/21/2013   Family History  Problem Relation Age of Onset  . Cancer Mother     Pancreatic Cancer  . Hepatitis C Mother   . Heart disease Father     congestive heart failure  . Panic disorder Daughter   . Asthma Maternal Grandmother   . Emphysema Maternal Grandmother   . Cancer Maternal Grandfather     lung   History  Substance Use Topics  . Smoking status: Never Smoker   . Smokeless tobacco: Never Used  . Alcohol Use: Yes     Comment: Occasional   OB History   Grav Para Term Preterm Abortions TAB SAB Ect Mult Living   1 1        1      Review of Systems  Constitutional: Negative for fever, chills, diaphoresis, appetite change and fatigue.  HENT: Negative for mouth sores, sore throat and trouble swallowing.   Eyes: Negative for visual disturbance.  Respiratory: Negative for cough, chest tightness, shortness of breath  and wheezing.   Cardiovascular: Positive for chest pain.  Gastrointestinal: Negative for nausea, vomiting, abdominal pain, diarrhea and abdominal distention.  Endocrine: Negative for polydipsia, polyphagia and polyuria.  Genitourinary: Negative for dysuria, frequency and hematuria.  Musculoskeletal: Positive for back pain and myalgias. Negative for gait problem.  Skin: Negative for color change, pallor and rash.  Neurological: Negative for dizziness, syncope, light-headedness and headaches.  Hematological: Does not bruise/bleed easily.  Psychiatric/Behavioral: Negative for behavioral problems and confusion.    Allergies  Aspirin; Nsaids; and Statins  Home Medications   Current Outpatient Rx  Name  Route  Sig  Dispense  Refill  . aspirin  EC 81 MG EC tablet   Oral   Take 1 tablet (81 mg total) by mouth daily.         Marland Kitchen ezetimibe (ZETIA) 10 MG tablet   Oral   Take 5 mg by mouth every Monday, Wednesday, and Friday.         Marland Kitchen lisinopril (PRINIVIL,ZESTRIL) 10 MG tablet   Oral   Take 1 tablet (10 mg total) by mouth daily.   90 tablet   3     Rx has expired - no refills remain   . Multiple Vitamin (MULTIVITAMIN WITH MINERALS) TABS   Oral   Take 1 tablet by mouth every morning.          . nitroGLYCERIN (NITROSTAT) 0.4 MG SL tablet   Sublingual   Place 1 tablet (0.4 mg total) under the tongue every 5 (five) minutes x 3 doses as needed for chest pain.   25 tablet   12   . omeprazole (PRILOSEC) 20 MG capsule   Oral   Take 20 mg by mouth 2 (two) times daily.           . pravastatin (PRAVACHOL) 10 MG tablet   Oral   Take 5 mg by mouth every Monday, Wednesday, and Friday.         . Ticagrelor (BRILINTA) 90 MG TABS tablet   Oral   Take 1 tablet (90 mg total) by mouth 2 (two) times daily.   60 tablet   11    BP 109/64  Pulse 64  Temp(Src) 98.1 F (36.7 C) (Oral)  Resp 18  Ht 5' (1.524 m)  Wt 129 lb (58.514 kg)  BMI 25.19 kg/m2  SpO2 99%  LMP 11/25/1994 Physical Exam  Constitutional: She is oriented to person, place, and time. She appears well-developed and well-nourished. No distress.  HENT:  Head: Normocephalic.  Eyes: Conjunctivae are normal. Pupils are equal, round, and reactive to light. No scleral icterus.  Neck: Normal range of motion. Neck supple. No thyromegaly present.  Cardiovascular: Normal rate and regular rhythm.  Exam reveals no gallop and no friction rub.   No murmur heard. Pulmonary/Chest: Effort normal and breath sounds normal. No respiratory distress. She has no wheezes. She has no rales.  Abdominal: Soft. Bowel sounds are normal. She exhibits no distension. There is no tenderness. There is no rebound.  Musculoskeletal: Normal range of motion.  Neurological: She is alert and  oriented to person, place, and time.  Skin: Skin is warm and dry. No rash noted.  Psychiatric: She has a normal mood and affect. Her behavior is normal.    ED Course  Procedures (including critical care time) Labs Review Labs Reviewed  COMPREHENSIVE METABOLIC PANEL - Abnormal; Notable for the following:    Glucose, Bld 101 (*)    GFR calc non Af Amer 85 (*)  All other components within normal limits  CBC  CK  POCT I-STAT TROPONIN I  POCT I-STAT TROPONIN I   Imaging Review No results found.  EKG Interpretation    Date/Time:  Saturday October 16 2013 14:27:29 EST Ventricular Rate:  67 PR Interval:  150 QRS Duration: 98 QT Interval:  424 QTC Calculation: 448 R Axis:   83 Text Interpretation:  Normal sinus rhythm Possible left atrial enlargement No significant change was found Confirmed by HORTON  MD, COURTNEY ([id number]) on 10/16/2013 2:32:29 PM            MDM   1. Shoulder pain, left   2. Chest pain   3. Muscle pain     Her pain is atypical. Differential diagnoses would include angina. On likely I think with her remainder of her catheterization. Could be myalgias from her statins. May be simply noncardiac atypical chest pain. No pleuritic component. Normal lungs doubt pneumonia. Not tachycardic no significant risks, therefore doubt PE.  Her evaluation is normal. Normal serial enzymes. Unchanged EKG. She had one culprit lesion underwent PTCA. The remainder of her disease in July of this year was nonocclusive.  She is doing ther a band exercises with her arms and shoulders in cardiac rehabilitation. Astra discuss with her physical therapist areas of pain in her shoulders. Alteration of her workout may help this.  I don't muscle pain is related to statin use. Some minimal dose every other day. She has a normal CPK.    Roney Marion, MD 10/16/13 [phone]

## 2013-10-31 ENCOUNTER — Encounter: Payer: Self-pay | Admitting: Emergency Medicine

## 2013-10-31 ENCOUNTER — Ambulatory Visit (INDEPENDENT_AMBULATORY_CARE_PROVIDER_SITE_OTHER): Payer: PRIVATE HEALTH INSURANCE | Admitting: Emergency Medicine

## 2013-10-31 VITALS — BP 126/64 | HR 61 | Temp 98.2°F | Resp 16 | Ht 61.0 in | Wt 129.0 lb

## 2013-10-31 DIAGNOSIS — H109 Unspecified conjunctivitis: Secondary | ICD-10-CM

## 2013-10-31 DIAGNOSIS — I25810 Atherosclerosis of coronary artery bypass graft(s) without angina pectoris: Secondary | ICD-10-CM

## 2013-10-31 DIAGNOSIS — R079 Chest pain, unspecified: Secondary | ICD-10-CM

## 2013-10-31 NOTE — Progress Notes (Signed)
Subjective:    Patient ID: Kristine [name], female    DOB: June 06, 1941, 72 y.o.   MRN: [id number] This chart was scribed for Kristine Chris, MD by Danella Maiers, ED Scribe. This patient was seen in room 4 and the patient's care was started at 12:25 PM.  Chief Complaint  Patient presents with  . FMLA Form    Sign off on returning to work full time on 11th, wants to add an hour of work each day this week to be ready when back full time    HPI HPI Comments: Kristine [name] is a 72 y.o. female with a h/o MI who presents to the [facility] needing an FMLA form completed to return to work full time on 12/11. She has been working half days, 20 hours per week, and wants to get back to 40 hours per week. Pt states she works 4 hours per day and wants to add one hour of work each day this week to be ready when back full time. She has an appointment to see her cardiologist 1/31 and needs to have a lipid panel done by then. She has been going to cardiac rehab 6 hours per week. Pt states she has been doing very well since her MI. She has removed sugar from her diet, started dancing and working out again.   She states she used contacts 2 days ago and her eyes have been red since. She denies change in contacts. She also reports nasal congestion.   Patient Active Problem List   Diagnosis Date Noted  . CAD (coronary artery disease), native coronary artery 06/27/2013  . Hyperlipidemia 06/27/2013  . Precordial pain 06/26/2013  . ST elevation myocardial infarction (STEMI) of anterolateral wall, initial episode of care 06/24/2013  . Hypertension   . Diverticulitis   . GERD (gastroesophageal reflux disease)   . Barrett's esophagus   . Cystocele   . Rectocele    Past Medical History  Diagnosis Date  . GERD (gastroesophageal reflux disease)   . Barrett's esophagus     Stage B  . Cystocele   . Rectocele     mild  . Hypertension   . Diverticulitis   . Arthritis   . Raynaud disease   . CREST  variant of scleroderma   . Osteopenia 09/2012    T score -2.4 FRAX 13%/2.7%  per 10/15/2012 discussion plan repeat in 2 years  . Anemia   . STEMI (ST elevation myocardial infarction) 06/21/2013  . CAD (coronary artery disease)   . Hyperlipidemia    Past Surgical History  Procedure Laterality Date  . Tonsillectomy  1950  . Tubal ligation  1980  . Nissen fundoplication  2006  . Total knee arthroplasty      right  . Joint replacement  9/10    rt total  . Foot surgery  2013    x2  . Abdominal hysterectomy  1996    RSO for menorrhagia.  . Coronary angioplasty with stent placement  06/21/2013   Allergies  Allergen Reactions  . Aspirin Other (See Comments)    "Irritating to GERD"  . Nsaids Other (See Comments)    GERD hx  . Statins Other (See Comments)    ELEVATED LFT'S   Prior to Admission medications   Medication Sig Start Date End Date Taking? Authorizing Provider  aspirin EC 81 MG EC tablet Take 1 tablet (81 mg total) by mouth daily. 06/24/13   Joline Salt Barrett, PA-C  ezetimibe (ZETIA)  10 MG tablet Take 5 mg by mouth every Monday, Wednesday, and Friday.    Historical Provider, MD  lisinopril (PRINIVIL,ZESTRIL) 10 MG tablet Take 1 tablet (10 mg total) by mouth daily. 06/21/13   Pearline Cables, MD  Multiple Vitamin (MULTIVITAMIN WITH MINERALS) TABS Take 1 tablet by mouth every morning.     Historical Provider, MD  nitroGLYCERIN (NITROSTAT) 0.4 MG SL tablet Place 1 tablet (0.4 mg total) under the tongue every 5 (five) minutes x 3 doses as needed for chest pain. 06/24/13   Rhonda G Barrett, PA-C  omeprazole (PRILOSEC) 20 MG capsule Take 20 mg by mouth 2 (two) times daily.      Historical Provider, MD  pravastatin (PRAVACHOL) 10 MG tablet Take 5 mg by mouth every Monday, Wednesday, and Friday.    Historical Provider, MD  Ticagrelor (BRILINTA) 90 MG TABS tablet Take 1 tablet (90 mg total) by mouth 2 (two) times daily. 06/24/13   Darrol Jump, PA-C   History  Substance Use Topics    . Smoking status: Never Smoker   . Smokeless tobacco: Never Used  . Alcohol Use: Yes     Comment: Occasional     Review of Systems  Eyes: Positive for redness.       Objective:   Physical Exam CONSTITUTIONAL: Well developed/well nourished HEAD: Normocephalic/atraumatic EYES: EOMI/PERRL B. Conjunctiva are slightly injected there is no purulence. ENMT: Mucous membranes moist NECK: supple no meningeal signs SPINE:entire spine nontender CV: S1/S2 noted, no murmurs/rubs/gallops noted LUNGS: Lungs are clear to auscultation bilaterally, no apparent distress ABDOMEN: soft, nontender, no rebound or guarding GU:no cva tenderness NEURO: Pt is awake/alert, moves all extremitiesx4 EXTREMITIES: pulses normal, full ROM SKIN: warm, color normal PSYCH: no abnormalities of mood noted   Filed Vitals:   10/31/13 1240  BP: 126/64  Pulse: 61  Temp: 98.2 F (36.8 C)  TempSrc: Oral  Resp: 16  Height: 5\' 1"  (1.549 m)  Weight: 129 lb (58.514 kg)  SpO2: 98%        Assessment & Plan:  Patient is doing great. She will use allergy eyedrops. She is going to increase her work by one hour per day over the next week and by that time she will be ready to return to work full-time regular duty to  I personally performed the services described in this documentation, which was scribed in my presence. The recorded information has been reviewed and is accurate.

## 2013-11-04 ENCOUNTER — Telehealth: Payer: Self-pay | Admitting: Cardiovascular Disease

## 2013-11-04 NOTE — Telephone Encounter (Signed)
New message    Did we get office note from Dr Means in [facility]? He is a lipid specialist

## 2013-11-04 NOTE — Telephone Encounter (Signed)
I haven't seen anything.

## 2013-11-04 NOTE — Telephone Encounter (Signed)
msg left for pt to let her know Dr/nurse are out of office and to return tomorrow.  I told her I do not see any scanned results from Dr Mean at this time.  Told her she will be contacted with information.

## 2013-11-08 ENCOUNTER — Ambulatory Visit (INDEPENDENT_AMBULATORY_CARE_PROVIDER_SITE_OTHER): Payer: PRIVATE HEALTH INSURANCE | Admitting: Family Medicine

## 2013-11-08 VITALS — BP 128/62 | HR 79 | Temp 98.4°F | Resp 16 | Ht 61.0 in | Wt 128.0 lb

## 2013-11-08 DIAGNOSIS — J029 Acute pharyngitis, unspecified: Secondary | ICD-10-CM

## 2013-11-08 DIAGNOSIS — J069 Acute upper respiratory infection, unspecified: Secondary | ICD-10-CM

## 2013-11-08 LAB — POCT RAPID STREP A (OFFICE): Rapid Strep A Screen: NEGATIVE

## 2013-11-08 NOTE — Patient Instructions (Addendum)
Drink plenty of fluids  Take Tylenol or ibuprofen for discomfort  Use some Robitussin-DM or Mucinex DM if you develop more congestion and coughing  Take plain Claritin or Allegra for head congestion.  Return if worse

## 2013-11-08 NOTE — Progress Notes (Signed)
Subjective: Patient has had a sore throat since yesterday and was afraid she was getting strep. She has history of heart disease and he is scared to get sick. She also has a ballroom dance event day after tomorrow she wanted to be well for that.  Objective: Pleasant lady in no major distress. TMs normal. Nose slightly congested. Throat erythematous without exudate. When I did a strep screen on her there is a lot of postnasal drainage that came down. Her neck was supple without significant nodes. Chest is clear to auscultation. Heart regular without murmurs.  Assessment: Pharyngitis  Plan: Strep screen and culture if needed  Results for orders placed in visit on 11/08/13  POCT RAPID STREP A (OFFICE)      Result Value Range   Rapid Strep A Screen Negative  Negative   I explained to her that this was a virus flexion and antibiotics would not help Symptomatic treatment

## 2013-11-10 NOTE — Telephone Encounter (Signed)
I left a message on Christena Flake voicemail to try and obtain records from Dr Mean.

## 2013-11-11 ENCOUNTER — Ambulatory Visit: Payer: PRIVATE HEALTH INSURANCE

## 2013-11-11 ENCOUNTER — Telehealth: Payer: Self-pay

## 2013-11-11 ENCOUNTER — Ambulatory Visit (INDEPENDENT_AMBULATORY_CARE_PROVIDER_SITE_OTHER): Payer: PRIVATE HEALTH INSURANCE | Admitting: Emergency Medicine

## 2013-11-11 VITALS — BP 118/62 | HR 92 | Temp 97.7°F | Resp 16 | Ht 61.0 in | Wt 128.0 lb

## 2013-11-11 DIAGNOSIS — R059 Cough, unspecified: Secondary | ICD-10-CM

## 2013-11-11 DIAGNOSIS — J101 Influenza due to other identified influenza virus with other respiratory manifestations: Secondary | ICD-10-CM

## 2013-11-11 DIAGNOSIS — J111 Influenza due to unidentified influenza virus with other respiratory manifestations: Secondary | ICD-10-CM

## 2013-11-11 DIAGNOSIS — R05 Cough: Secondary | ICD-10-CM

## 2013-11-11 LAB — POCT CBC
Granulocyte percent: 66.7 %G (ref 37–80)
HCT, POC: 43.5 % (ref 37.7–47.9)
Hemoglobin: 13.6 g/dL (ref 12.2–16.2)
Lymph, poc: 2.3 (ref 0.6–3.4)
MCHC: 31.3 g/dL — AB (ref 31.8–35.4)
POC Granulocyte: 7 — AB (ref 2–6.9)
RBC: 4.86 M/uL (ref 4.04–5.48)

## 2013-11-11 LAB — POCT INFLUENZA A/B
Influenza A, POC: POSITIVE
Influenza B, POC: NEGATIVE

## 2013-11-11 LAB — CULTURE, GROUP A STREP: Organism ID, Bacteria: NORMAL

## 2013-11-11 NOTE — Telephone Encounter (Signed)
She needs to return to clinic if she is coughing up blood, this is not normal. Left message for her to call me back.

## 2013-11-11 NOTE — Telephone Encounter (Signed)
Patient advised. She will come in today

## 2013-11-11 NOTE — Telephone Encounter (Signed)
Coughing up blood and phlegm has turned to yellow in color.   Requesting antibiotic.   [phone]

## 2013-11-11 NOTE — Progress Notes (Signed)
Subjective:  This chart was scribed for [name], MD by Carl Best, Medical Scribe. This patient was seen in Room 5 and the patient's care was started at 1:21 PM.   Patient ID: Kristine [name], female    DOB: 1941/04/30, 72 y.o.   MRN: [id number]  HPI HPI Comments: Kristine [name] is a 72 y.o. female with a history of MI who presents to the [facility] complaining of a constant cough productive of sputum with red streaks that started on Tuesday.  The patient states that her symptoms started in her head and are now located in her chest.  She states that her sputum was clear and thick on Monday and is now thick and yellow.  She lists nasal congestion and rhinorrhea as associated symptoms.  She states that her mucous is white when she blows her nose.  She denies postnasal drip, headache, wheezing, fever, and sinus pressure as associated symptoms.  The patient states that she had a Pneumonia and flu shot.  The patient states that she saw Dr. Alwyn Ren on Monday evening.  She states that she was not given an inhaler.    Past Medical History  Diagnosis Date  . GERD (gastroesophageal reflux disease)   . Barrett's esophagus     Stage B  . Cystocele   . Rectocele     mild  . Hypertension   . Diverticulitis   . Arthritis   . Raynaud disease   . CREST variant of scleroderma   . Osteopenia 09/2012    T score -2.4 FRAX 13%/2.7%  per 10/15/2012 discussion plan repeat in 2 years  . Anemia   . STEMI (ST elevation myocardial infarction) 06/21/2013  . CAD (coronary artery disease)   . Hyperlipidemia    Past Surgical History  Procedure Laterality Date  . Tonsillectomy  1950  . Tubal ligation  1980  . Nissen fundoplication  2006  . Total knee arthroplasty      right  . Joint replacement  9/10    rt total  . Foot surgery  2013    x2  . Abdominal hysterectomy  1996    RSO for menorrhagia.  . Coronary angioplasty with stent placement  06/21/2013   Family History  Problem Relation  Age of Onset  . Cancer Mother     Pancreatic Cancer  . Hepatitis C Mother   . Heart disease Father     congestive heart failure  . Panic disorder Daughter   . Asthma Maternal Grandmother   . Emphysema Maternal Grandmother   . Cancer Maternal Grandfather     lung   History   Social History  . Marital Status: Divorced    Spouse Name: N/A    Number of Children: N/A  . Years of Education: N/A   Occupational History  . Not on file.   Social History Main Topics  . Smoking status: Never Smoker   . Smokeless tobacco: Never Used  . Alcohol Use: Yes     Comment: Occasional  . Drug Use: No  . Sexual Activity: No   Other Topics Concern  . Not on file   Social History Narrative   Exercise dancing 3 x times weekly for 1-2 hours   Allergies  Allergen Reactions  . Aspirin Other (See Comments)    "Irritating to GERD"  . Nsaids Other (See Comments)    GERD hx  . Statins Other (See Comments)    ELEVATED LFT'S    Review of  Systems  Constitutional: Negative for fever.  HENT: Positive for congestion and rhinorrhea. Negative for postnasal drip, sinus pressure and sore throat.   Respiratory: Positive for cough. Negative for wheezing.   Neurological: Negative for headaches.     Objective:  Physical Exam Physical Exam  Nursing note and vitals reviewed. Constitutional: He is oriented to person, place, and time. He appears well-developed and well-nourished. No distress.  HENT: Nose is clear and there is no purulence.  Head: Normocephalic and atraumatic.  Eyes: Conjunctivae and EOM are normal. Pupils are equal, round, and reactive to light.  Neck: Neck supple.  Cardiovascular: Normal rate.   Pulmonary/Chest: Effort normal. Breath sounds are symmetrical and lungs are clear.  Neurological: He is alert and oriented to person, place, and time. No cranial nerve deficit.  Psychiatric: He has a normal mood and affect. His behavior is normal.  [facility] reading (PRIMARY) by  Dr. Cleta Alberts she  has increased AP diameter with a scoliotic curve there are no pulmonic infiltrates  Results for orders placed in visit on 11/11/13  POCT CBC      Result Value Range   WBC 10.5 (*) 4.6 - 10.2 K/uL   Lymph, poc 2.3  0.6 - 3.4   POC LYMPH PERCENT 21.7  10 - 50 %L   MID (cbc) 1.2 (*) 0 - 0.9   POC MID % 11.6  0 - 12 %M   POC Granulocyte 7.0 (*) 2 - 6.9   Granulocyte percent 66.7  37 - 80 %G   RBC 4.86  4.04 - 5.48 M/uL   Hemoglobin 13.6  12.2 - 16.2 g/dL   HCT, POC 14.7  82.9 - 47.9 %   MCV 89.5  80 - 97 fL   MCH, POC 28.0  27 - 31.2 pg   MCHC 31.3 (*) 31.8 - 35.4 g/dL   RDW, POC 56.2     Platelet Count, POC 187  142 - 424 K/uL   MPV 9.1  0 - 99.8 fL  POCT INFLUENZA A/B      Result Value Range   Influenza A, POC Positive     Influenza B, POC Negative         Assessment & Plan:  Patient will be on Tylenol fluids I personally performed the services described in this documentation, which was scribed in my presence. The recorded information has been reviewed and is accurate.

## 2013-11-11 NOTE — Patient Instructions (Signed)

## 2013-11-12 ENCOUNTER — Telehealth: Payer: Self-pay | Admitting: Radiology

## 2013-11-12 ENCOUNTER — Ambulatory Visit: Payer: PRIVATE HEALTH INSURANCE | Admitting: Cardiovascular Disease

## 2013-11-12 NOTE — Telephone Encounter (Signed)
Patient advised.

## 2013-11-14 ENCOUNTER — Telehealth: Payer: Self-pay

## 2013-11-14 NOTE — Telephone Encounter (Signed)
Patient is not feeling well and would like to know if she should stay home from work. If so, she would like a letter excusing from work.

## 2013-11-15 ENCOUNTER — Ambulatory Visit (INDEPENDENT_AMBULATORY_CARE_PROVIDER_SITE_OTHER): Payer: PRIVATE HEALTH INSURANCE | Admitting: Emergency Medicine

## 2013-11-15 ENCOUNTER — Ambulatory Visit: Payer: PRIVATE HEALTH INSURANCE

## 2013-11-15 VITALS — BP 122/80 | HR 85 | Temp 98.4°F | Resp 16 | Ht 61.0 in | Wt 128.0 lb

## 2013-11-15 DIAGNOSIS — J111 Influenza due to unidentified influenza virus with other respiratory manifestations: Secondary | ICD-10-CM

## 2013-11-15 DIAGNOSIS — R059 Cough, unspecified: Secondary | ICD-10-CM

## 2013-11-15 DIAGNOSIS — R05 Cough: Secondary | ICD-10-CM

## 2013-11-15 LAB — POCT CBC
HCT, POC: 43.6 % (ref 37.7–47.9)
Hemoglobin: 13.3 g/dL (ref 12.2–16.2)
Lymph, poc: 2.4 (ref 0.6–3.4)
MCH, POC: 27.4 pg (ref 27–31.2)
MCHC: 30.5 g/dL — AB (ref 31.8–35.4)
MCV: 89.7 fL (ref 80–97)
POC LYMPH PERCENT: 34 %L (ref 10–50)
RDW, POC: 15.9 %
WBC: 7 10*3/uL (ref 4.6–10.2)

## 2013-11-15 NOTE — Progress Notes (Signed)
Subjective:    Patient ID: Kristine [name], female    DOB: Jul 27, 1941, 72 y.o.   MRN: [id number]  HPI  This chart was scribed for Collene Gobble., MD, by Ellin Mayhew, ED Scribe. This patient was seen in room 12 and the patient's care was started at 12:52 PM.  HPI Comments: Kristine [name] is a 72 y.o. female who presents to the [facility] complaining of a constant productive cough and congestion, recently diagnosed with the flu. She is here for a f/u to check for flu symptoms. She reports yellow/green sputum accompanying her cough.She has a history of GERD and CAD.  Past Medical History  Diagnosis Date  . GERD (gastroesophageal reflux disease)   . Barrett's esophagus     Stage B  . Cystocele   . Rectocele     mild  . Hypertension   . Diverticulitis   . Arthritis   . Raynaud disease   . CREST variant of scleroderma   . Osteopenia 09/2012    T score -2.4 FRAX 13%/2.7%  per 10/15/2012 discussion plan repeat in 2 years  . Anemia   . STEMI (ST elevation myocardial infarction) 06/21/2013  . CAD (coronary artery disease)   . Hyperlipidemia     Past Surgical History  Procedure Laterality Date  . Tonsillectomy  1950  . Tubal ligation  1980  . Nissen fundoplication  2006  . Total knee arthroplasty      right  . Joint replacement  9/10    rt total  . Foot surgery  2013    x2  . Abdominal hysterectomy  1996    RSO for menorrhagia.  . Coronary angioplasty with stent placement  06/21/2013    Family History  Problem Relation Age of Onset  . Cancer Mother     Pancreatic Cancer  . Hepatitis C Mother   . Heart disease Father     congestive heart failure  . Panic disorder Daughter   . Asthma Maternal Grandmother   . Emphysema Maternal Grandmother   . Cancer Maternal Grandfather     lung    History   Social History  . Marital Status: Divorced    Spouse Name: N/A    Number of Children: N/A  . Years of Education: N/A   Occupational History  . Not on file.     Social History Main Topics  . Smoking status: Never Smoker   . Smokeless tobacco: Never Used  . Alcohol Use: Yes     Comment: Occasional  . Drug Use: No  . Sexual Activity: No   Other Topics Concern  . Not on file   Social History Narrative   Exercise dancing 3 x times weekly for 1-2 hours    Allergies  Allergen Reactions  . Aspirin Other (See Comments)    "Irritating to GERD"  . Nsaids Other (See Comments)    GERD hx  . Statins Other (See Comments)    ELEVATED LFT'S    Patient Active Problem List   Diagnosis Date Noted  . CAD (coronary artery disease), native coronary artery 06/27/2013  . Hyperlipidemia 06/27/2013  . Precordial pain 06/26/2013  . ST elevation myocardial infarction (STEMI) of anterolateral wall, initial episode of care 06/24/2013  . Hypertension   . Diverticulitis   . GERD (gastroesophageal reflux disease)   . Barrett's esophagus   . Cystocele   . Rectocele     Results for orders placed in visit on 11/11/13  POCT CBC  Result Value Range   WBC 10.5 (*) 4.6 - 10.2 K/uL   Lymph, poc 2.3  0.6 - 3.4   POC LYMPH PERCENT 21.7  10 - 50 %L   MID (cbc) 1.2 (*) 0 - 0.9   POC MID % 11.6  0 - 12 %M   POC Granulocyte 7.0 (*) 2 - 6.9   Granulocyte percent 66.7  37 - 80 %G   RBC 4.86  4.04 - 5.48 M/uL   Hemoglobin 13.6  12.2 - 16.2 g/dL   HCT, POC 21.3  08.6 - 47.9 %   MCV 89.5  80 - 97 fL   MCH, POC 28.0  27 - 31.2 pg   MCHC 31.3 (*) 31.8 - 35.4 g/dL   RDW, POC 57.8     Platelet Count, POC 187  142 - 424 K/uL   MPV 9.1  0 - 99.8 fL  POCT INFLUENZA A/B      Result Value Range   Influenza A, POC Positive     Influenza B, POC Negative      No diagnosis found.  No orders of the defined types were placed in this encounter.    BP 122/80  Pulse 85  Temp(Src) 98.4 F (36.9 C) (Oral)  Resp 16  Ht 5\' 1"  (1.549 m)  Wt 128 lb (58.06 kg)  BMI 24.20 kg/m2  SpO2 99%  LMP 11/25/1994   Review of Systems  Constitutional: Negative for fever and  chills.  HENT: Positive for congestion.   Respiratory: Positive for cough.   Gastrointestinal: Negative for nausea, vomiting and diarrhea.   A complete 10 system review of systems was obtained and all systems are negative except as noted in the HPI and PMH.      Objective:   Physical Exam  Nursing note and vitals reviewed. Constitutional: She is oriented to person, place, and time. She appears well-developed and well-nourished. No distress.  HENT:  Head: Normocephalic and atraumatic.  Eyes: Conjunctivae are normal. Right eye exhibits no discharge. Left eye exhibits no discharge.  Neck: Normal range of motion. Neck supple.  Cardiovascular: Normal rate, regular rhythm and normal heart sounds.   No murmur heard. Pulmonary/Chest: Effort normal. No respiratory distress.  Musculoskeletal: Normal range of motion.  Neurological: She is alert and oriented to person, place, and time.  Skin: Skin is warm and dry.  Psychiatric: She has a normal mood and affect. Her behavior is normal.   Results for orders placed in visit on 11/15/13  POCT CBC      Result Value Range   WBC 7.0  4.6 - 10.2 K/uL   Lymph, poc 2.4  0.6 - 3.4   POC LYMPH PERCENT 34.0  10 - 50 %L   MID (cbc) 0.6  0 - 0.9   POC MID % 8.2  0 - 12 %M   POC Granulocyte 4.0  2 - 6.9   Granulocyte percent 57.8  37 - 80 %G   RBC 4.86  4.04 - 5.48 M/uL   Hemoglobin 13.3  12.2 - 16.2 g/dL   HCT, POC 46.9  62.9 - 47.9 %   MCV 89.7  80 - 97 fL   MCH, POC 27.4  27 - 31.2 pg   MCHC 30.5 (*) 31.8 - 35.4 g/dL   RDW, POC 52.8     Platelet Count, POC 193  142 - 424 K/uL   MPV 9.5  0 - 99.8 fL  [facility] reading (PRIMARY) by  Dr. Cleta Alberts no acute  disease .      Assessment & Plan:  No change in treatment. I will leave her out of work 2 more weeks. Recheck if she develops purulent sputum.

## 2013-11-15 NOTE — Telephone Encounter (Signed)
Yes, she should stay home, what dates does she need? She has the flu and should rest for 1 week. Called her. Left message for her to call me back.

## 2013-11-22 NOTE — Telephone Encounter (Signed)
Patient has not returned my calls. She did return after this message was taken.

## 2013-11-23 NOTE — Telephone Encounter (Signed)
I spoke with the pt and made her aware that we have not received any documentation from Dr Mean.  The pt said that he sent a note to her PCP that a lipid profile needs to be rechecked due to the pt's dietary changes.  Due to the pt being sick with the flu she has not had this lab work done.  I instructed the pt to bring a copy of her labs to appointment with Dr Excell Seltzer in January if she gets her labs drawn. Pt agreed with plan.

## 2013-11-24 ENCOUNTER — Ambulatory Visit (INDEPENDENT_AMBULATORY_CARE_PROVIDER_SITE_OTHER): Payer: PRIVATE HEALTH INSURANCE | Admitting: Emergency Medicine

## 2013-11-24 VITALS — BP 120/72 | HR 91 | Temp 98.8°F | Resp 17 | Ht 62.5 in | Wt 127.0 lb

## 2013-11-24 DIAGNOSIS — R059 Cough, unspecified: Secondary | ICD-10-CM

## 2013-11-24 DIAGNOSIS — J069 Acute upper respiratory infection, unspecified: Secondary | ICD-10-CM

## 2013-11-24 DIAGNOSIS — R05 Cough: Secondary | ICD-10-CM

## 2013-11-24 NOTE — Progress Notes (Signed)
Subjective:    Patient ID: Kristine [name], female    DOB: December 11, 1940, 72 y.o.   MRN: [id number]  HPI Scribed for Kristine Chris MD, the patient was seen in room 3. This chart was scribed by Lewanda Rife, ED scribe. Patient's care was started at 3:23 PM  HPI Comments: Kristine [name] is a 72 y.o. female who presents to the [facility] complaining of constant congestion onset 16 days. Describes URI symptoms as significantly improved. Reports associated persistent cough, which is occasionally with phlegm. Denies any aggravating factors. Reports symptoms are significantly alleviated with Mucinex. Denies associated fever. Pt wants to go back to work and needs forms filled out. Pt states she has been out of work 11/11/13 and wants to return 11/29/13.  11/15/13 Pt was recently evaluated for sore throat with positive Influenza A swab Past Medical History  Diagnosis Date  . GERD (gastroesophageal reflux disease)   . Barrett's esophagus     Stage B  . Cystocele   . Rectocele     mild  . Hypertension   . Diverticulitis   . Arthritis   . Raynaud disease   . CREST variant of scleroderma   . Osteopenia 09/2012    T score -2.4 FRAX 13%/2.7%  per 10/15/2012 discussion plan repeat in 2 years  . Anemia   . STEMI (ST elevation myocardial infarction) 06/21/2013  . CAD (coronary artery disease)   . Hyperlipidemia     Past Surgical History  Procedure Laterality Date  . Tonsillectomy  1950  . Tubal ligation  1980  . Nissen fundoplication  2006  . Total knee arthroplasty      right  . Joint replacement  9/10    rt total  . Foot surgery  2013    x2  . Abdominal hysterectomy  1996    RSO for menorrhagia.  . Coronary angioplasty with stent placement  06/21/2013    Family History  Problem Relation Age of Onset  . Cancer Mother     Pancreatic Cancer  . Hepatitis C Mother   . Heart disease Father     congestive heart failure  . Panic disorder Daughter   . Asthma Maternal  Grandmother   . Emphysema Maternal Grandmother   . Cancer Maternal Grandfather     lung    History   Social History  . Marital Status: Divorced    Spouse Name: N/A    Number of Children: N/A  . Years of Education: N/A   Occupational History  . Not on file.   Social History Main Topics  . Smoking status: Never Smoker   . Smokeless tobacco: Never Used  . Alcohol Use: Yes     Comment: Occasional  . Drug Use: No  . Sexual Activity: No   Other Topics Concern  . Not on file   Social History Narrative   Exercise dancing 3 x times weekly for 1-2 hours    Allergies  Allergen Reactions  . Aspirin Other (See Comments)    "Irritating to GERD"  . Nsaids Other (See Comments)    GERD hx  . Statins Other (See Comments)    ELEVATED LFT'S    Patient Active Problem List   Diagnosis Date Noted  . CAD (coronary artery disease), native coronary artery 06/27/2013  . Hyperlipidemia 06/27/2013  . Precordial pain 06/26/2013  . ST elevation myocardial infarction (STEMI) of anterolateral wall, initial episode of care 06/24/2013  . Hypertension   . Diverticulitis   .  GERD (gastroesophageal reflux disease)   . Barrett's esophagus   . Cystocele   . Rectocele        Review of Systems     Objective:   Physical Exam   Physical Exam  Nursing note and vitals reviewed. Constitutional: She is oriented to person, place, and time. She appears well-developed and well-nourished. No distress.  HENT:  Head: Normocephalic and atraumatic.  Eyes: EOM are normal.  Neck: Neck supple. No tracheal deviation present.  Cardiovascular: Normal rate.   Pulmonary/Chest: Effort normal. No respiratory distress. No rales, rhonchi, or wheeze noted. Lung fields are clear. Musculoskeletal: Normal range of motion.  Neurological: She is alert and oriented to person, place, and time.  Skin: Skin is warm and dry.  Psychiatric: She has a normal mood and affect. Her behavior is normal.   DIAGNOSTIC  STUDIES: Oxygen Saturation is 99% on room air, normal by my interpretation.    COORDINATION OF CARE:  Nursing notes reviewed. Vital signs reviewed. Initial pt interview and examination performed.   3:31 PM-Discussed treatment plan with pt at bedside. Pt agrees with plan.   Treatment plan initiated:Medications - No data to display   Initial diagnostic testing ordered.       Assessment & Plan:  Overturn to work on Monday for change of medication .

## 2013-11-26 ENCOUNTER — Telehealth: Payer: Self-pay

## 2013-11-26 NOTE — Telephone Encounter (Signed)
FMLA paperwork faxed to [facility].

## 2013-12-01 ENCOUNTER — Ambulatory Visit: Payer: PRIVATE HEALTH INSURANCE | Admitting: Cardiovascular Disease

## 2013-12-11 ENCOUNTER — Telehealth: Payer: Self-pay

## 2013-12-11 NOTE — Telephone Encounter (Signed)
Daub - Pt says her lipid specialist sent over an order for blood work.  She says this has to be done before she can go back to them.   Wants to know if we received this.  [phone]  She says to please leave message if she doesn't answer

## 2013-12-11 NOTE — Telephone Encounter (Signed)
Daub - Pt says her lipid specialist faxed over an order for lab work.  She can not go back to them until she has the lab work done.  Did we received this?  [phone]

## 2013-12-13 NOTE — Telephone Encounter (Signed)
I have the request from her doctor to have her blood work done. She can come in and see me this week or next week fasting and I will do the blood work they requested

## 2013-12-14 ENCOUNTER — Other Ambulatory Visit: Payer: Self-pay

## 2013-12-14 ENCOUNTER — Ambulatory Visit (INDEPENDENT_AMBULATORY_CARE_PROVIDER_SITE_OTHER): Payer: BC Managed Care – PPO | Admitting: Family Medicine

## 2013-12-14 VITALS — BP 110/70 | HR 66 | Temp 98.1°F | Resp 16 | Ht 60.75 in | Wt 128.0 lb

## 2013-12-14 DIAGNOSIS — M79602 Pain in left arm: Secondary | ICD-10-CM

## 2013-12-14 DIAGNOSIS — R071 Chest pain on breathing: Secondary | ICD-10-CM

## 2013-12-14 DIAGNOSIS — I252 Old myocardial infarction: Secondary | ICD-10-CM

## 2013-12-14 DIAGNOSIS — M79609 Pain in unspecified limb: Secondary | ICD-10-CM

## 2013-12-14 DIAGNOSIS — M791 Myalgia, unspecified site: Secondary | ICD-10-CM

## 2013-12-14 DIAGNOSIS — IMO0001 Reserved for inherently not codable concepts without codable children: Secondary | ICD-10-CM

## 2013-12-14 DIAGNOSIS — I25810 Atherosclerosis of coronary artery bypass graft(s) without angina pectoris: Secondary | ICD-10-CM

## 2013-12-14 DIAGNOSIS — R0789 Other chest pain: Secondary | ICD-10-CM

## 2013-12-14 LAB — NMR LIPOPROFILE WITH LIPIDS

## 2013-12-14 LAB — HEPATIC FUNCTION PANEL
ALBUMIN: 4.4 g/dL (ref 3.5–5.2)
ALT: 15 U/L (ref 0–35)
AST: 20 U/L (ref 0–37)
Alkaline Phosphatase: 75 U/L (ref 39–117)
Bilirubin, Direct: 0.1 mg/dL (ref 0.0–0.3)
Indirect Bilirubin: 0.3 mg/dL (ref 0.0–0.9)
Total Bilirubin: 0.4 mg/dL (ref 0.3–1.2)
Total Protein: 6.6 g/dL (ref 6.0–8.3)

## 2013-12-14 LAB — TSH: TSH: 2.031 u[IU]/mL (ref 0.350–4.500)

## 2013-12-14 LAB — CK: CK TOTAL: 44 U/L (ref 7–177)

## 2013-12-14 LAB — T4, FREE: Free T4: 1.14 ng/dL (ref 0.80–1.80)

## 2013-12-14 NOTE — Patient Instructions (Signed)
You should receive a call or letter about your lab results within the next week to 10 days.  Decrease your upper arm exercises over the next week. If your arm pain worsens, or does not improve in the next week - return for recheck.  If chest pains, recur, especially if lasting more than a brief moment - return here, go to emergency room, or call 911.  Return to the clinic or go to the nearest emergency room if any of your symptoms worsen or new symptoms occur.

## 2013-12-14 NOTE — Progress Notes (Addendum)
Subjective:    Patient ID: Kristine [name], female    DOB: 1941-07-26, 73 y.o.   MRN: [id number] Authored by Janeann Forehand, MD, but unable to change in [facility].  HPI HPI Comments: Kristine [name] is a 73 y.o. female who presents to the Emergency Department complaining of several months of periodic episodes of "jabbing" to her chest, one of which occurred yesterday at 5:45 pm and was quickly resolved with one nitro. She reports the episode lasted briefly. The pt also reports she has experienced prior episodes of similar symptoms and she was treated in the ED for the symptoms in Sept, four months ago. She denies chest pressure, SOB, dizziness, light-headedness, or nausea associated with the episodes of "jabbing." She also denies calf pain, long immobilization, or recent long trips in the car.   The pt has a h/o CAD, status post STEMI 06/21/13. Status post PTCA followed by Dr. Burt Knack. With plan to remain on dual antiplatelet therapy with ASA and Brilynta for one year. Her prior notes indicate she is intolerant to statins with history of elevated LFTs. She has an appt with Dr. Burt Knack in ten days.   She states she has graduated from cardiac rehab at [facility]. She continues to be followed by Dr. Lamona Curl, a lipids specialist. He placed her on half of Zetia and half of pravastatin on MWF because the full size medications were elevating her liver counts. She has been taking the half-medications MWF for 3 months. She reports she has not had any issues with the medications since changing to the half-doses and alternating days. Dr. Lamona Curl has requested the pt undergo several tests for including TSH 3 4 count, hepatic function panel, and Lipoprotein MNR.   The pt reports that since the STEMI, she has practiced remaining more calm, she has returned to dancing., and she has lost 20 pounds.  She states she has also experienced persistent "soreness" to her upper arms and shoulders bilaterally which began yesterday evening. The  pt reports she has been exercising consistently, but she denies an increase of weights or change in her exercise regime recently. Palpation does not increase the pain.   The pt reports she experienced the flu this season even after having the influenza vaccination.   Past Medical History  Diagnosis Date   GERD (gastroesophageal reflux disease)    Barrett's esophagus     Stage B   Cystocele    Rectocele     mild   Hypertension    Diverticulitis    Arthritis    Raynaud disease    CREST variant of scleroderma    Osteopenia 09/2012    T score -2.4 FRAX 13%/2.7%  per 10/15/2012 discussion plan repeat in 2 years   Anemia    STEMI (ST elevation myocardial infarction) 06/21/2013   CAD (coronary artery disease)    Hyperlipidemia    Past Surgical History  Procedure Laterality Date   Tonsillectomy  1950   Tubal ligation  5400   Nissen fundoplication  8676   Total knee arthroplasty      right   Joint replacement  9/10    rt total   Foot surgery  2013    x2   Abdominal hysterectomy  1996    RSO for menorrhagia.   Coronary angioplasty with stent placement  06/21/2013    Current Outpatient Prescriptions on File Prior to Visit  Medication Sig Dispense Refill   aspirin EC 81 MG EC tablet Take 1 tablet (81 mg total) by  mouth daily.       ezetimibe (ZETIA) 10 MG tablet Take 5 mg by mouth every Monday, Wednesday, and Friday.       lisinopril (PRINIVIL,ZESTRIL) 10 MG tablet Take 1 tablet (10 mg total) by mouth daily.  90 tablet  3   nitroGLYCERIN (NITROSTAT) 0.4 MG SL tablet Place 1 tablet (0.4 mg total) under the tongue every 5 (five) minutes x 3 doses as needed for chest pain.  25 tablet  12   omeprazole (PRILOSEC) 20 MG capsule Take 20 mg by mouth 2 (two) times daily.         pravastatin (PRAVACHOL) 10 MG tablet Take 5 mg by mouth every Monday, Wednesday, and Friday.       Ticagrelor (BRILINTA) 90 MG TABS tablet Take 1 tablet (90 mg total) by mouth 2 (two)  times daily.  60 tablet  11   Multiple Vitamin (MULTIVITAMIN WITH MINERALS) TABS Take 1 tablet by mouth every morning.        No current facility-administered medications on file prior to visit.   History   Social History   Marital Status: Divorced    Spouse Name: N/A    Number of Children: N/A   Years of Education: N/A   Social History Main Topics   Smoking status: Never Smoker    Smokeless tobacco: Never Used   Alcohol Use: Yes     Comment: Occasional   Drug Use: No   Sexual Activity: No   Other Topics Concern   None   Social History Narrative   Exercise dancing 3 x times weekly for 1-2 hours    Review of Systems  Respiratory: Negative for chest tightness and shortness of breath.   Cardiovascular: Positive for chest pain.  Gastrointestinal: Negative for nausea.  Musculoskeletal: Positive for myalgias.  Neurological: Negative for dizziness and light-headedness.    Vitals: BP 110/70   Pulse 66   Temp(Src) 98.1 F (36.7 C) (Oral)   Resp 16   Ht 5' 0.75" (1.543 m)   Wt 128 lb (58.06 kg)   BMI 24.39 kg/m2   SpO2 99%   LMP 11/25/1994     Objective:   Physical Exam  Nursing note and vitals reviewed. Constitutional: She is oriented to person, place, and time. She appears well-developed and well-nourished. No distress.  HENT:  Head: Normocephalic and atraumatic.  Eyes: Conjunctivae and EOM are normal. Pupils are equal, round, and reactive to light.  Neck: Neck supple. Carotid bruit is not present. No tracheal deviation present.  Cardiovascular: Normal rate, regular rhythm, normal heart sounds and intact distal pulses.   Pulmonary/Chest: Effort normal and breath sounds normal. No respiratory distress.  No reproducible chest pain with pressure to the chest wall.   Abdominal: Soft. She exhibits no pulsatile midline mass. There is no tenderness.  Musculoskeletal: Normal range of motion.  Right and left shoulder muscles, no focal tenderness. Full rotator cuff  strength with testing.  Ina and AC joints are non-tender.   Neurological: She is alert and oriented to person, place, and time.  Skin: Skin is warm and dry.  Psychiatric: She has a normal mood and affect. Her behavior is normal.     EKG: sr, RSR in V1, no changes from prior EKG seen. No acute findings.       Assessment & Plan:   Informed the pt that an EKG would be performed as well as the lab work requested by Dr. Lamona Curl. Encouraged the pt that if the chest pain  returns and lasts more than a few seconds, then she would follow up with her PCP, Dr. Burt Knack, or the ED depending upon the severity of the symptoms.   Concerning the pt's aches in her arms, advised her to avoid repetitive movements of her upper body. Advised her to decrease reps or the weight, reducing both by half, for approximately a week. Informed the pt that sometimes soreness improves with exercise which is why she should not completely cease exercising.   Kristine [name] is a 73 y.o. female Chest wall pain - Plan: Lipoprotein electrophoresis, EKG 12-Lead  CAD (coronary artery disease) of artery bypass graft - Plan: CK total and CKMB (cardiac), TSH, T4, free, Hepatic function panel, Lipoprotein electrophoresis, CANCELED: Lipoprotein Analysis by NMR  Hx of myocardial infarction - Plan: CK total and CKMB (cardiac), TSH, T4, free, Hepatic function panel, Lipoprotein electrophoresis, EKG 12-Lead, CANCELED: Lipoprotein Analysis by NMR  Arm pain, left - Plan: CK total and CKMB (cardiac), EKG 12-Lead  Myalgia - Plan: CK total and CKMB (cardiac), CANCELED: Lipoprotein Analysis by NMR  Possible fleeting sharp chest wall pain - resolved quickly, reassuring EKG. Rtc/ER precautions discussed. Will check labs above as requested by lipid specialist - can route these to cardiologist as well for eval. Will check CPK for arm pain (but likely exercise related), now that she is on statin, but low/intermittent dosing. Sx care as below. rtc/ER  precautions.   No orders of the defined types were placed in this encounter.   Patient Instructions  You should receive a call or letter about your lab results within the next week to 10 days.  Decrease your upper arm exercises over the next week. If your arm pain worsens, or does not improve in the next week - return for recheck.  If chest pains, recur, especially if lasting more than a brief moment - return here, go to emergency room, or call 911.  Return to the clinic or go to the nearest emergency room if any of your symptoms worsen or new symptoms occur.       I personally performed the services described in this documentation, which was scribed in my presence. The recorded information has been reviewed and considered, and addended by me as needed.

## 2013-12-14 NOTE — Telephone Encounter (Signed)
Advised pt to rtn call for Dr. Everlene Farrier schedule to come in fasting.

## 2013-12-15 ENCOUNTER — Other Ambulatory Visit (INDEPENDENT_AMBULATORY_CARE_PROVIDER_SITE_OTHER): Payer: BC Managed Care – PPO

## 2013-12-15 DIAGNOSIS — R071 Chest pain on breathing: Secondary | ICD-10-CM

## 2013-12-15 DIAGNOSIS — I25810 Atherosclerosis of coronary artery bypass graft(s) without angina pectoris: Secondary | ICD-10-CM

## 2013-12-15 LAB — NMR LIPOPROFILE WITH LIPIDS
Cholesterol, Total: 170 mg/dL (ref <200)
HDL Particle Number: 34.7 umol/L (ref >=30.5)
HDL SIZE: 9.1 nm — AB (ref >=9.2)
HDL-C: 51 mg/dL (ref >=40)
LARGE HDL: 6.5 umol/L (ref >=4.8)
LDL CALC: 97 mg/dL (ref <100)
LDL Particle Number: 1277 nmol/L — ABNORMAL HIGH (ref <1000)
LDL Size: 20.8 nm (ref >20.5)
LP-IR SCORE: 33 (ref <=45)
Large VLDL-P: 1.3 nmol/L (ref <=2.7)
Small LDL Particle Number: 541 nmol/L — ABNORMAL HIGH (ref <=527)
Triglycerides: 111 mg/dL (ref <150)
VLDL Size: 43.9 nm (ref <=46.6)

## 2013-12-15 NOTE — Addendum Note (Signed)
Addended by: Anson Fret on: 12/15/2013 08:11 AM   Modules accepted: Orders

## 2013-12-15 NOTE — Progress Notes (Signed)
Patient here for labs only.  No charge

## 2013-12-22 ENCOUNTER — Encounter: Payer: Self-pay | Admitting: Family Medicine

## 2013-12-24 ENCOUNTER — Ambulatory Visit (INDEPENDENT_AMBULATORY_CARE_PROVIDER_SITE_OTHER): Payer: BC Managed Care – PPO | Admitting: Cardiovascular Disease

## 2013-12-24 ENCOUNTER — Encounter: Payer: Self-pay | Admitting: Cardiovascular Disease

## 2013-12-24 VITALS — BP 124/76 | HR 60 | Ht 60.75 in | Wt 128.0 lb

## 2013-12-24 DIAGNOSIS — E785 Hyperlipidemia, unspecified: Secondary | ICD-10-CM

## 2013-12-24 MED ORDER — PRAVASTATIN SODIUM 10 MG PO TABS: ORAL_TABLET | ORAL | Status: DC

## 2013-12-24 MED ORDER — EZETIMIBE 10 MG PO TABS: ORAL_TABLET | ORAL | Status: DC

## 2013-12-24 NOTE — Patient Instructions (Addendum)
Your physician wants you to follow-up in: 6 months with Dr. Burt Knack.  You will receive a reminder letter in the mail two months in advance. If you don't receive a letter, please call our office to schedule the follow-up appointment.  Take your Zetia and Pravachol every day

## 2013-12-24 NOTE — Progress Notes (Signed)
HPI:  73 year old woman presenting for followup evaluation. The patient has coronary artery disease and she presented with an acute lateral ST elevation MI 06/21/2013. She underwent PCI to left circumflex with a drug-eluting stent. She had some residual CAD with medical therapy recommended.    Her cardiac catheterization demonstrated occlusion of the small diagonal branch with left to left collaterals. There is 90% stenosis in the first obtuse marginal branch and this was treated with PCI. The right coronary artery had moderate 50-60% stenosis. The LAD was widely patent as was the left main. The patient's left ventricular ejection fraction was initially 35-40%. On followup echocardiography her ejection fraction had improved to 50-55%.  She's doing great. Has been following a good diet, exercising, and feels well. She's had problems with lipid-lowering drugs in the past, but is tolerating low doses of zetia and pravachol 3 days/week. Had an NMR lipomed panel done. Denies chest pain, dyspnea, edema, or palpitations. No exertional symptoms.  Outpatient Encounter Prescriptions as of 12/24/2013  Medication Sig  . aspirin EC 81 MG EC tablet Take 1 tablet (81 mg total) by mouth daily.  Marland Kitchen ezetimibe (ZETIA) 10 MG tablet Take 0.5 tablets (5 mg total) by mouth daily  . lisinopril (PRINIVIL,ZESTRIL) 10 MG tablet Take 1 tablet (10 mg total) by mouth daily.  . Multiple Vitamin (MULTIVITAMIN WITH MINERALS) TABS Take 1 tablet by mouth every morning.   . nitroGLYCERIN (NITROSTAT) 0.4 MG SL tablet Place 1 tablet (0.4 mg total) under the tongue every 5 (five) minutes x 3 doses as needed for chest pain.  Marland Kitchen omeprazole (PRILOSEC) 20 MG capsule Take 20 mg by mouth 2 (two) times daily.    . pravastatin (PRAVACHOL) 10 MG tablet Take 0.5 tablet (5 mg) daily  . Ticagrelor (BRILINTA) 90 MG TABS tablet Take 1 tablet (90 mg total) by mouth 2 (two) times daily.  . [DISCONTINUED] ezetimibe (ZETIA) 10 MG tablet Take 5 mg by  mouth every Monday, Wednesday, and Friday.  . [DISCONTINUED] pravastatin (PRAVACHOL) 10 MG tablet Take 5 mg by mouth every Monday, Wednesday, and Friday.    Allergies  Allergen Reactions  . Aspirin Other (See Comments)    "Irritating to GERD"  . Nsaids Other (See Comments)    GERD hx  . Statins Other (See Comments)    ELEVATED LFT'S    Past Medical History  Diagnosis Date  . GERD (gastroesophageal reflux disease)   . Barrett's esophagus     Stage B  . Cystocele   . Rectocele     mild  . Hypertension   . Diverticulitis   . Arthritis   . Raynaud disease   . CREST variant of scleroderma   . Osteopenia 09/2012    T score -2.4 FRAX 13%/2.7%  per 10/15/2012 discussion plan repeat in 2 years  . Anemia   . STEMI (ST elevation myocardial infarction) 06/21/2013  . CAD (coronary artery disease)   . Hyperlipidemia     ROS: Negative except as per HPI  BP 124/76  Pulse 60  Ht 5' 0.75" (1.543 m)  Wt 128 lb (58.06 kg)  BMI 24.39 kg/m2  LMP 11/25/1994  PHYSICAL EXAM: Pt is alert and oriented, NAD HEENT: normal Neck: JVP - normal, carotids 2+= without bruits Lungs: CTA bilaterally CV: RRR without murmur or gallop Abd: soft, NT, Positive BS, no hepatomegaly Ext: no C/C/E, distal pulses intact and equal Skin: warm/dry no rash  ASSESSMENT AND PLAN: 1. CAD, native vessel. Stable without symptoms of angina.  She will continue ASA and brilinta. Recommend f/u in 6 months.  2. HTN: BP controlled.   3. Hyperlipidemia: NMR panel reviewed. LDL particle # greater than 1000. Recommended that she continue same doses of Zetial and pravastatin but increase requency to every day. She sees PCP in a few months likely with f/u labs.  I will see her back in 6 months for follow-up. She was applauded for how hard she's worked on diet and exercise.  [name] 12/24/2013 7:05 PM

## 2013-12-31 ENCOUNTER — Encounter: Payer: Self-pay | Admitting: Emergency Medicine

## 2014-01-04 ENCOUNTER — Telehealth: Payer: Self-pay | Admitting: Cardiovascular Disease

## 2014-01-04 ENCOUNTER — Ambulatory Visit (INDEPENDENT_AMBULATORY_CARE_PROVIDER_SITE_OTHER): Payer: BC Managed Care – PPO | Admitting: Family Medicine

## 2014-01-04 ENCOUNTER — Emergency Department (HOSPITAL_COMMUNITY)
Admission: EM | Admit: 2014-01-04 | Discharge: 2014-01-04 | Disposition: A | Discharge status: Home / Self Care | Payer: BC Managed Care – PPO | Attending: Emergency Medicine | Admitting: Emergency Medicine

## 2014-01-04 ENCOUNTER — Emergency Department (HOSPITAL_COMMUNITY): Payer: BC Managed Care – PPO

## 2014-01-04 ENCOUNTER — Encounter (HOSPITAL_COMMUNITY): Payer: Self-pay | Admitting: Emergency Medicine

## 2014-01-04 VITALS — BP 148/72 | HR 62 | Temp 97.3°F | Resp 16 | Ht 62.0 in | Wt 125.0 lb

## 2014-01-04 DIAGNOSIS — Z862 Personal history of diseases of the blood and blood-forming organs and certain disorders involving the immune mechanism: Secondary | ICD-10-CM | POA: Insufficient documentation

## 2014-01-04 DIAGNOSIS — I10 Essential (primary) hypertension: Secondary | ICD-10-CM

## 2014-01-04 DIAGNOSIS — R110 Nausea: Secondary | ICD-10-CM

## 2014-01-04 DIAGNOSIS — E785 Hyperlipidemia, unspecified: Secondary | ICD-10-CM | POA: Insufficient documentation

## 2014-01-04 DIAGNOSIS — R42 Dizziness and giddiness: Secondary | ICD-10-CM | POA: Insufficient documentation

## 2014-01-04 DIAGNOSIS — I2510 Atherosclerotic heart disease of native coronary artery without angina pectoris: Secondary | ICD-10-CM | POA: Insufficient documentation

## 2014-01-04 DIAGNOSIS — R51 Headache: Secondary | ICD-10-CM | POA: Insufficient documentation

## 2014-01-04 DIAGNOSIS — R1110 Vomiting, unspecified: Secondary | ICD-10-CM

## 2014-01-04 DIAGNOSIS — Z8742 Personal history of other diseases of the female genital tract: Secondary | ICD-10-CM | POA: Insufficient documentation

## 2014-01-04 DIAGNOSIS — M129 Arthropathy, unspecified: Secondary | ICD-10-CM | POA: Insufficient documentation

## 2014-01-04 DIAGNOSIS — K219 Gastro-esophageal reflux disease without esophagitis: Secondary | ICD-10-CM | POA: Insufficient documentation

## 2014-01-04 DIAGNOSIS — R519 Headache, unspecified: Secondary | ICD-10-CM

## 2014-01-04 DIAGNOSIS — Z79899 Other long term (current) drug therapy: Secondary | ICD-10-CM | POA: Insufficient documentation

## 2014-01-04 DIAGNOSIS — I252 Old myocardial infarction: Secondary | ICD-10-CM | POA: Insufficient documentation

## 2014-01-04 DIAGNOSIS — Z9861 Coronary angioplasty status: Secondary | ICD-10-CM | POA: Insufficient documentation

## 2014-01-04 DIAGNOSIS — Z7982 Long term (current) use of aspirin: Secondary | ICD-10-CM | POA: Insufficient documentation

## 2014-01-04 LAB — POCT URINALYSIS DIPSTICK
Bilirubin, UA: NEGATIVE
Glucose, UA: NEGATIVE
Ketones, UA: NEGATIVE
Nitrite, UA: NEGATIVE
PROTEIN UA: NEGATIVE
SPEC GRAV UA: 1.015
Urobilinogen, UA: 0.2
pH, UA: 7

## 2014-01-04 LAB — COMPREHENSIVE METABOLIC PANEL
ALBUMIN: 3.8 g/dL (ref 3.5–5.2)
ALT: 16 U/L (ref 0–35)
ALT: 16 U/L (ref 0–35)
AST: 17 U/L (ref 0–37)
AST: 17 U/L (ref 0–37)
Albumin: 4.1 g/dL (ref 3.5–5.2)
Alkaline Phosphatase: 90 U/L (ref 39–117)
Alkaline Phosphatase: 101 U/L (ref 39–117)
BILIRUBIN TOTAL: 0.3 mg/dL (ref 0.2–1.2)
BUN: 13 mg/dL (ref 6–23)
BUN: 12 mg/dL (ref 6–23)
CO2: 27 meq/L (ref 19–32)
CO2: 27 mEq/L (ref 19–32)
CREATININE: 0.61 mg/dL (ref 0.50–1.10)
Calcium: 9.3 mg/dL (ref 8.4–10.5)
Calcium: 9.8 mg/dL (ref 8.4–10.5)
Chloride: 102 mEq/L (ref 96–112)
Chloride: 105 mEq/L (ref 96–112)
Creat: 0.66 mg/dL (ref 0.50–1.10)
GFR calc Af Amer: >90 mL/min (ref >90)
GFR calc non Af Amer: 90 mL/min — ABNORMAL LOW (ref >90)
Glucose, Bld: 133 mg/dL — ABNORMAL HIGH (ref 70–99)
Glucose, Bld: 99 mg/dL (ref 70–99)
Potassium: 4.3 mEq/L (ref 3.5–5.3)
Potassium: 4.4 mEq/L (ref 3.7–5.3)
SODIUM: 139 meq/L (ref 135–145)
Sodium: 144 mEq/L (ref 137–147)
TOTAL PROTEIN: 6.6 g/dL (ref 6.0–8.3)
Total Bilirubin: 0.3 mg/dL (ref 0.3–1.2)
Total Protein: 7.1 g/dL (ref 6.0–8.3)

## 2014-01-04 LAB — GLUCOSE, POCT (MANUAL RESULT ENTRY): POC GLUCOSE: 143 mg/dL — AB (ref 70–99)

## 2014-01-04 LAB — POCT CBC
Granulocyte percent: 80.6 %G — AB (ref 37–80)
HCT, POC: 44.5 % (ref 37.7–47.9)
HEMOGLOBIN: 13.6 g/dL (ref 12.2–16.2)
Lymph, poc: 1.7 (ref 0.6–3.4)
MCH: 27.6 pg (ref 27–31.2)
MCHC: 30.6 g/dL — AB (ref 31.8–35.4)
MCV: 90.3 fL (ref 80–97)
MID (cbc): 0.4 (ref 0–0.9)
MPV: 9.2 fL (ref 0–99.8)
POC Granulocyte: 8.6 — AB (ref 2–6.9)
POC LYMPH PERCENT: 15.8 %L (ref 10–50)
POC MID %: 3.6 % (ref 0–12)
Platelet Count, POC: 184 10*3/uL (ref 142–424)
RBC: 4.93 M/uL (ref 4.04–5.48)
RDW, POC: 15.6 %
WBC: 10.7 10*3/uL — AB (ref 4.6–10.2)

## 2014-01-04 NOTE — Telephone Encounter (Signed)
New message   C/O not feeling well today . N / V, headache , lightheaded. 146/64 . PCP was not contacted patient stated she wanted to contact Dr. Burt Knack first.

## 2014-01-04 NOTE — ED Notes (Addendum)
Had a h/a from 4 -8 am got nauseous and got dizzy , went to dr  Today  Had ua and ekg all normal  Sent because she still dizzy still nauseous and vomited states h/a is gone now

## 2014-01-04 NOTE — Telephone Encounter (Signed)
Left message for patient to call office.  

## 2014-01-04 NOTE — Progress Notes (Addendum)
This chart was scribed for Wardell Honour, MD by Eston Mould, ED Scribe. This patient was seen in room Room/bed 9 and the patient's care was started at 12:36 PM. Subjective:    Patient ID: Kristine [name], female    DOB: [birth year], 73 y.o.   MRN: [id number]  01/04/2014 Chief Complaint  Patient presents with  . Headache    X early this morning  . Dizziness    X early this morning  . Emesis    Once today   Headache  Associated symptoms include dizziness, eye pain, nausea and vomiting. Pertinent negatives include no coughing, ear pain, fever, hearing loss, rhinorrhea, seizures, sinus pressure, sore throat or weakness.  Dizziness Associated symptoms include headaches, nausea and vomiting. Pertinent negatives include no chest pain, chills, congestion, coughing, diaphoresis, fatigue, fever, sore throat or weakness.  Emesis  Associated symptoms include dizziness and headaches. Pertinent negatives include no chest pain, chills, coughing or fever.  HPI Comments: Kristine [name] is a 73 y.o. female with a hx of stroke and vertigo who presents to the [facility] complaining of ongoing HA, emesis, lightheaded , and dizziness with congestion and clamminess that began this morning. Pt states she woke up with a HA with pressure to R eye. Pt states she took 2 OTC Extra StrengthTylenols. She states she went to back to sleep but woke up once more feeling light-headed, dizzy and nauseated. Pt states she took her BP at 9am (148/64). She states her BP has been elevated since she arrived to [facility].  She states her HA is "not as bad" and rates her HA 7/10. She states "she just doesn't feel right when she has to stand or sit up". Pt states she has had 1 episode of emesis. Pt states lying flat worsens her light-headedness/dizziness. She states she gets light-headed occasionally and suspects this is due to insufficient water intake.Pt states she generally drinks coffee and tea. Pt states she began taking Zetia and  Pravastatin. Pt states she was advised by PCP to take half pills beginning last week. Pt denies hearing loss, ringing noise, sore throat, ear pain, CP, dysuria, numbness or tingling to extremities, fever, and LOC.   Pt states she had a stroke July 2014.   Review of Systems  Constitutional: Negative for fever, chills, diaphoresis and fatigue.  HENT: Negative for congestion, ear pain, hearing loss, postnasal drip, rhinorrhea, sinus pressure, sore throat, trouble swallowing and voice change.   Eyes: Positive for pain.  Respiratory: Negative for cough and shortness of breath.   Cardiovascular: Negative for chest pain, palpitations and leg swelling.  Gastrointestinal: Positive for nausea and vomiting.  Genitourinary: Negative for dysuria.  Neurological: Positive for dizziness, light-headedness and headaches. Negative for tremors, seizures, syncope, facial asymmetry, speech difficulty and weakness.    Past Medical History  Diagnosis Date  . GERD (gastroesophageal reflux disease)   . Barrett's esophagus     Stage B  . Cystocele   . Rectocele     mild  . Hypertension   . Diverticulitis   . Arthritis   . Raynaud disease   . CREST variant of scleroderma   . Osteopenia 09/2012    T score -2.4 FRAX 13%/2.7%  per 10/15/2012 discussion [name] repeat in 2 years  . Anemia   . STEMI (ST elevation myocardial infarction) 06/21/2013  . CAD (coronary artery disease)   . Hyperlipidemia    Allergies  Allergen Reactions  . Aspirin Other (See Comments)    Pt is prone to bleeding, ok  with low dose coated aspirin  . Nsaids Other (See Comments)    Pt is prone to bleeding  . Statins Other (See Comments)    elevated LFT'S, tolerates 1/2 dose of zetia and 1/2 dose of pravastatin   Current Outpatient Prescriptions  Medication Sig Dispense Refill  . aspirin EC 81 MG EC tablet Take 1 tablet (81 mg total) by mouth daily.    . Multiple Vitamin (MULTIVITAMIN WITH MINERALS) TABS Take 1 tablet by mouth daily.      . nitroGLYCERIN (NITROSTAT) 0.4 MG SL tablet Place 1 tablet (0.4 mg total) under the tongue every 5 (five) minutes x 3 doses as needed for chest pain. 25 tablet 12  . omeprazole (PRILOSEC) 20 MG capsule Take 20 mg by mouth 2 (two) times daily.      . benzonatate (TESSALON) 100 MG capsule Take 1-2 capsules (100-200 mg total) by mouth 3 (three) times daily as needed for cough. 40 capsule 0  . cephALEXin (KEFLEX) 500 MG capsule Take 1 capsule (500 mg total) by mouth 2 (two) times daily. 14 capsule 0  . doxycycline (VIBRAMYCIN) 100 MG capsule Prn dental    . ezetimibe (ZETIA) 10 MG tablet Take 0.5 tablets (5 mg total) by mouth daily. 90 tablet 2  . lisinopril (PRINIVIL,ZESTRIL) 5 MG tablet Take 1 tablet (5 mg total) by mouth daily. 90 tablet 1  . NON FORMULARY FINITI ( ANTI-AGING) EVERY OTHER DAY     No current facility-administered medications for this visit.       Objective:    BP 148/72 mmHg  Pulse 62  Temp(Src) 97.3 F (36.3 C) (Oral)  Resp 16  Ht 5\' 2"  (1.575 m)  Wt 125 lb (56.7 kg)  BMI 22.86 kg/m2  SpO2 98%  LMP 11/25/1994  Physical Exam  Nursing note and vitals reviewed. Constitutional: She is oriented to person, place, and time. She appears well-developed and well-nourished. No distress.  HENT:  Head: Normocephalic and atraumatic.  Right Ear: External ear normal.  Left Ear: External ear normal.  Nose: Nose normal.  Mouth/Throat: Oropharynx is clear and moist.  Eyes: Conjunctivae are normal. Pupils are equal, round, and reactive to light.  Neck: Normal range of motion. Neck supple. No thyromegaly present.  Cardiovascular: Normal rate, regular rhythm and normal heart sounds.  Exam reveals no gallop and no friction rub.   No murmur heard. Pulmonary/Chest: Effort normal and breath sounds normal. No respiratory distress. She has no wheezes. She has no rales.  Abdominal: Bowel sounds are normal. She exhibits no mass. There is no rebound and no guarding.  Musculoskeletal:  Normal range of motion. She exhibits no tenderness.  Neurological: She is alert and oriented to person, place, and time. She has normal strength and normal reflexes. She displays a negative Romberg sign.  Finger-to-nose is normal. Alternating hands normal. Reflex's are brisk 3+. Dix-Hallpike to the R, nystagmus . No nystagmus to the L, minimal dizziness.  Skin: Skin is warm and dry. She is not diaphoretic.  Psychiatric: She has a normal mood and affect. Her behavior is normal.   Results for orders placed or performed in visit on 01/04/14  Comprehensive metabolic panel  Result Value Ref Range   Sodium 139 135 - 145 mEq/L   Potassium 4.3 3.5 - 5.3 mEq/L   Chloride 102 96 - 112 mEq/L   CO2 27 19 - 32 mEq/L   Glucose, Bld 133 (H) 70 - 99 mg/dL   BUN 13 6 - 23 mg/dL  Creat 0.66 0.50 - 1.10 mg/dL   Total Bilirubin 0.3 0.2 - 1.2 mg/dL   Alkaline Phosphatase 90 39 - 117 U/L   AST 17 0 - 37 U/L   ALT 16 0 - 35 U/L   Total Protein 6.6 6.0 - 8.3 g/dL   Albumin 4.1 3.5 - 5.2 g/dL   Calcium 9.3 8.4 - 10.5 mg/dL  POCT CBC  Result Value Ref Range   WBC 10.7 (A) 4.6 - 10.2 K/uL   Lymph, poc 1.7 0.6 - 3.4   POC LYMPH PERCENT 15.8 10 - 50 %L   MID (cbc) 0.4 0 - 0.9   POC MID % 3.6 0 - 12 %M   POC Granulocyte 8.6 (A) 2 - 6.9   Granulocyte percent 80.6 (A) 37 - 80 %G   RBC 4.93 4.04 - 5.48 M/uL   Hemoglobin 13.6 12.2 - 16.2 g/dL   HCT, POC 44.5 37.7 - 47.9 %   MCV 90.3 80 - 97 fL   MCH, POC 27.6 27 - 31.2 pg   MCHC 30.6 (A) 31.8 - 35.4 g/dL   RDW, POC 15.6 %   Platelet Count, POC 184 142 - 424 K/uL   MPV 9.2 0 - 99.8 fL  POCT glucose (manual entry)  Result Value Ref Range   POC Glucose 143 (A) 70 - 99 mg/dl  POCT urinalysis dipstick  Result Value Ref Range   Color, UA yellow    Clarity, UA clear    Glucose, UA neg    Bilirubin, UA neg    Ketones, UA neg    Spec Grav, UA 1.015    Blood, UA eng    pH, UA 7.0    Protein, UA neg    Urobilinogen, UA 0.2    Nitrite, UA neg     Leukocytes, UA small (1+)    EKG: sinus brady; no arrhythmia; no ST changes acute.    Assessment & [name]:  Vomiting - [name]: POCT CBC, POCT glucose (manual entry), Comprehensive metabolic panel, EKG 28-BTDV, POCT urinalysis dipstick  Headache(784.0) - [name]: POCT CBC, POCT glucose (manual entry), Comprehensive metabolic panel, EKG 76-HYWV, POCT urinalysis dipstick  Nausea - [name]: POCT CBC, POCT glucose (manual entry), Comprehensive metabolic panel, EKG 37-TGGY, POCT urinalysis dipstick  Lightheaded  CAD (coronary artery disease), native coronary artery  Hyperlipidemia  Hypertension   1. Headache:  New.  Associated with dizziness, n/v.  Concerning for CVA; advised pt to present to ED immediately to undergo evaluation with CT or MRI brain.  Pt agreeable.  2.  Dizziness: New. Associated with headache, n/v.  Refer to ED for evaluation for CVA; warrants imaging of brain.  3.  N/V:  New. Associated with headache, dizziness.  Benign abdominal exam. Benign urine and CBC. 4.  CAD: stable; denies chest pain at this time.  No acute changes on EKG. 5.  HTN: stable; slightly elevated readings today are not cause of current symptoms. 6. Hyperlipidemia: stable; do not feel current symptoms due to lipid agents.   No orders of the defined types were placed in this encounter.    No Follow-up on file.    I personally performed the services described in this documentation, which was scribed in my presence.  The recorded information has been reviewed and is accurate.  Reginia Forts, M.D.  [facility] [address] [place], [place]  [address] [phone] phone [phone] fax

## 2014-01-04 NOTE — Telephone Encounter (Signed)
Patient returned my call, states she is at her PCP's office.  Patient reports that at approximately 0400 she was awakened from sleep with a headache specifically over her right eye.  Patient reports she took 2 Tylenol and then felt lightheaded; pt reports she went back to sleep.  At approximately 0800 she reports that she woke up and felt nauseated and then vomited x 1.  Patient reports at this time her BP was 146/64 and she called her daughter to come to her home to be with her because she wasn't feeling well.  Patient states her BP a little while ago was 150/80.  While we were talking the patient was called from the waiting room by the staff at her PCP's office.  I advised patient to call back this afternoon with further concerns and patient verbalized agreement and understanding.

## 2014-01-04 NOTE — ED Provider Notes (Signed)
CSN: [id number]     Arrival date & time 01/04/14  1400 History   First MD Initiated Contact with Patient 01/04/14 1658     CC Lightheaded  HPI Pt developed a headache at 4am.  She took some tylenol and was able to go back to bed.  She woke up in the morning and was feeling light headed and was nauseated. She did have an episode of vomiting.  She went to her doctors office and had lab testing.  Her symptoms are improving but she still felt a little lightheaded so her doctor asked her to come to the ED for a CT can.  No headache now.  Her family feels that her gait/balance is a little off however she feels that it is fine.  No trouble with blurred vision or her speech. Past Medical History  Diagnosis Date  . GERD (gastroesophageal reflux disease)   . Barrett's esophagus     Stage B  . Cystocele   . Rectocele     mild  . Hypertension   . Diverticulitis   . Arthritis   . Raynaud disease   . CREST variant of scleroderma   . Osteopenia 09/2012    T score -2.4 FRAX 13%/2.7%  per 10/15/2012 discussion plan repeat in 2 years  . Anemia   . STEMI (ST elevation myocardial infarction) 06/21/2013  . CAD (coronary artery disease)   . Hyperlipidemia    Past Surgical History  Procedure Laterality Date  . Tonsillectomy  1950  . Tubal ligation  1980  . Nissen fundoplication  7782  . Total knee arthroplasty      right  . Joint replacement  9/10    rt total  . Foot surgery  2013    x2  . Abdominal hysterectomy  1996    RSO for menorrhagia.  . Coronary angioplasty with stent placement  06/21/2013   Family History  Problem Relation Age of Onset  . Cancer Mother     Pancreatic Cancer  . Hepatitis C Mother   . Heart disease Father     congestive heart failure  . Panic disorder Daughter   . Asthma Maternal Grandmother   . Emphysema Maternal Grandmother   . Cancer Maternal Grandfather     lung   History  Substance Use Topics  . Smoking status: Never Smoker   . Smokeless tobacco: Never  Used  . Alcohol Use: Yes     Comment: Occasional   OB History   Grav Para Term Preterm Abortions TAB SAB Ect Mult Living   1 1        1      Review of Systems  Constitutional: Negative for fever.  HENT: Negative for tinnitus.   Respiratory: Negative for cough.   Cardiovascular: Negative for chest pain.  Gastrointestinal: Positive for nausea and vomiting. Negative for abdominal pain.  Genitourinary: Negative for dysuria.  Musculoskeletal: Negative for neck pain.  Neurological: Positive for light-headedness and headaches. Negative for tremors, seizures, syncope, speech difficulty, weakness and numbness.  All other systems reviewed and are negative.      Allergies  Aspirin; Nsaids; and Statins  Home Medications   Current Outpatient Rx  Name  Route  Sig  Dispense  Refill  . acetaminophen (TYLENOL) 500 MG tablet   Oral   Take 1,000 mg by mouth 2 (two) times daily as needed (pain).         Marland Kitchen aspirin EC 81 MG EC tablet   Oral   Take  1 tablet (81 mg total) by mouth daily.         Marland Kitchen ezetimibe (ZETIA) 10 MG tablet   Oral   Take 5 mg by mouth daily.         Marland Kitchen lisinopril (PRINIVIL,ZESTRIL) 10 MG tablet   Oral   Take 1 tablet (10 mg total) by mouth daily.   90 tablet   3     Rx has expired - no refills remain   . Multiple Vitamin (MULTIVITAMIN WITH MINERALS) TABS   Oral   Take 1 tablet by mouth daily.          . nitroGLYCERIN (NITROSTAT) 0.4 MG SL tablet   Sublingual   Place 1 tablet (0.4 mg total) under the tongue every 5 (five) minutes x 3 doses as needed for chest pain.   25 tablet   12   . omeprazole (PRILOSEC) 20 MG capsule   Oral   Take 20 mg by mouth 2 (two) times daily.           . pravastatin (PRAVACHOL) 10 MG tablet   Oral   Take 5 mg by mouth at bedtime.         . Ticagrelor (BRILINTA) 90 MG TABS tablet   Oral   Take 1 tablet (90 mg total) by mouth 2 (two) times daily.   60 tablet   11    BP 118/66  Pulse 56  Temp(Src) 97.5 F  (36.4 C) (Oral)  Resp 18  Ht 5' (1.524 m)  Wt 125 lb 3.2 oz (56.79 kg)  BMI 24.45 kg/m2  SpO2 97%  LMP 11/25/1994 Physical Exam  Nursing note and vitals reviewed. Constitutional: She is oriented to person, place, and time. She appears well-developed and well-nourished. No distress.  HENT:  Head: Normocephalic and atraumatic.  Right Ear: External ear normal.  Left Ear: External ear normal.  Mouth/Throat: Oropharynx is clear and moist.  Eyes: Conjunctivae are normal. Right eye exhibits no discharge. Left eye exhibits no discharge. No scleral icterus.  Neck: Neck supple. No tracheal deviation present.  Cardiovascular: Normal rate, regular rhythm and intact distal pulses.   Pulmonary/Chest: Effort normal and breath sounds normal. No stridor. No respiratory distress. She has no wheezes. She has no rales.  Abdominal: Soft. Bowel sounds are normal. She exhibits no distension. There is no tenderness. There is no rebound and no guarding.  Musculoskeletal: She exhibits no edema and no tenderness.  Neurological: She is alert and oriented to person, place, and time. She has normal strength. No cranial nerve deficit (No facial droop, extraocular movements intact, tongue midline ) or sensory deficit. She exhibits normal muscle tone. She displays no seizure activity. Coordination normal.  No pronator drift bilateral upper extrem, able to hold both legs off bed for 5 seconds, sensation intact in all extremities, no visual field cuts, no left or right sided neglect, normal finger-nose exam bilaterally, no nystagmus noted, negative rhomberg, normal gait observed walking down the hall in the ED   Skin: Skin is warm and dry. No rash noted.  Psychiatric: She has a normal mood and affect.    ED Course  Procedures (including critical care time) Labs Review Labs Reviewed  COMPREHENSIVE METABOLIC PANEL - Abnormal; Notable for the following:    GFR calc non Af Amer 90 (*)    All other components within  normal limits   Imaging Review Ct Head Wo Contrast  01/04/2014   CLINICAL DATA:  Headache, dizziness, nausea  EXAM: CT HEAD  WITHOUT CONTRAST  TECHNIQUE: Contiguous axial images were obtained from the base of the skull through the vertex without intravenous contrast.  COMPARISON:  10/27/2012  FINDINGS: No skull fracture is noted. Paranasal sinuses and mastoid air cells are unremarkable.  No intracranial hemorrhage, mass effect or midline shift. Stable cerebral atrophy. Periventricular white matter decreased attenuation probable due to chronic small vessel ischemic changes. No acute cortical infarction. No mass lesion is noted on this unenhanced scan.  IMPRESSION: No acute intracranial abnormality. Stable cerebral atrophy. Periventricular white matter decreased attenuation probable due to chronic small vessel ischemic changes.   Electronically Signed   By: Lahoma Crocker M.D.   On: 01/04/2014 16:42   EKG was done by PCP:   Rate 60, Sinus  Rhythm  -RSR(V1) -nondiagnostic.   -  Nonspecific T-abnormality.   MDM   Final diagnoses:  Headache    Patient had an episode of headache associated with nausea and vomiting earlier in the day today. She had a CBC and an EKg at her doctor's office. Patient had a conference metabolic panel emergency department was unremarkable. She has a CT scan that did not show any acute abnormalities. The patient had an entirely normal neurologic exam during my evaluation. I observed her gait in emergency department she had no evidence of ataxia.  At this point, and a low suspicion for cerebellar stroke or other acute neurologic emergency. I do not feel that MRI testing is necessary at this time. Warning signs and precautions were discussed with the family. Patient is comfortable going home    Kathalene Frames, MD 01/04/14 [phone]

## 2014-01-04 NOTE — Discharge Instructions (Signed)

## 2014-01-05 ENCOUNTER — Telehealth: Payer: Self-pay

## 2014-01-05 NOTE — Telephone Encounter (Signed)
PT [place] LAST NIGHT AND TODAY SHE STILL ISN'T FEELING THAT GOOD, SAW DR Sherley Bounds. WOULD LIKE TO SPEAK WITH SOMEONE TO SEE IF SHE NEEDED TO COME BACK IN. PLEASE CALL [phone]

## 2014-01-06 ENCOUNTER — Telehealth: Payer: Self-pay

## 2014-01-06 ENCOUNTER — Ambulatory Visit: Payer: BC Managed Care – PPO | Admitting: Family Medicine

## 2014-01-06 ENCOUNTER — Encounter: Payer: Self-pay | Admitting: Family Medicine

## 2014-01-06 VITALS — BP 124/80 | HR 72 | Temp 98.0°F | Resp 16 | Ht 60.5 in | Wt 124.0 lb

## 2014-01-06 DIAGNOSIS — J3489 Other specified disorders of nose and nasal sinuses: Secondary | ICD-10-CM

## 2014-01-06 DIAGNOSIS — R1012 Left upper quadrant pain: Secondary | ICD-10-CM

## 2014-01-06 DIAGNOSIS — R42 Dizziness and giddiness: Secondary | ICD-10-CM

## 2014-01-06 DIAGNOSIS — R110 Nausea: Secondary | ICD-10-CM

## 2014-01-06 DIAGNOSIS — R51 Headache: Secondary | ICD-10-CM

## 2014-01-06 DIAGNOSIS — R0981 Nasal congestion: Secondary | ICD-10-CM

## 2014-01-06 MED ORDER — MECLIZINE HCL 12.5 MG PO TABS: 12.5000 mg | ORAL_TABLET | Freq: Three times a day (TID) | ORAL | Status: DC | PRN

## 2014-01-06 MED ORDER — FLUTICASONE PROPIONATE 50 MCG/ACT NA SUSP: 2.0000 | Freq: Every day | NASAL | Status: DC

## 2014-01-06 NOTE — Telephone Encounter (Signed)
Lm for rtn call 

## 2014-01-06 NOTE — Telephone Encounter (Signed)
Pt is coming in to see Dr. Tamala Julian today for a follow up after her hospital visit. She is still missing work due to the dizzy spells.

## 2014-01-06 NOTE — Patient Instructions (Signed)
Vertigo Vertigo means you feel like you or your surroundings are moving when they are not. Vertigo can be dangerous if it occurs when you are at work, driving, or performing difficult activities.  CAUSES  Vertigo occurs when there is a conflict of signals sent to your brain from the visual and sensory systems in your body. There are many different causes of vertigo, including:  Infections, especially in the inner ear.  A bad reaction to a drug or misuse of alcohol and medicines.  Withdrawal from drugs or alcohol.  Rapidly changing positions, such as lying down or rolling over in bed.  A migraine headache.  Decreased blood flow to the brain.  Increased pressure in the brain from a head injury, infection, tumor, or bleeding. SYMPTOMS  You may feel as though the world is spinning around or you are falling to the ground. Because your balance is upset, vertigo can cause nausea and vomiting. You may have involuntary eye movements (nystagmus). DIAGNOSIS  Vertigo is usually diagnosed by physical exam. If the cause of your vertigo is unknown, your caregiver may perform imaging tests, such as an MRI scan (magnetic resonance imaging). TREATMENT  Most cases of vertigo resolve on their own, without treatment. Depending on the cause, your caregiver may prescribe certain medicines. If your vertigo is related to body position issues, your caregiver may recommend movements or procedures to correct the problem. In rare cases, if your vertigo is caused by certain inner ear problems, you may need surgery. HOME CARE INSTRUCTIONS   Follow your caregiver's instructions.  Avoid driving.  Avoid operating heavy machinery.  Avoid performing any tasks that would be dangerous to you or others during a vertigo episode.  Tell your caregiver if you notice that certain medicines seem to be causing your vertigo. Some of the medicines used to treat vertigo episodes can actually make them worse in some people. SEEK  IMMEDIATE MEDICAL CARE IF:   Your medicines do not relieve your vertigo or are making it worse.  You develop problems with talking, walking, weakness, or using your arms, hands, or legs.  You develop severe headaches.  Your nausea or vomiting continues or gets worse.  You develop visual changes.  A family member notices behavioral changes.  Your condition gets worse. MAKE SURE YOU:  Understand these instructions.  Will watch your condition.  Will get help right away if you are not doing well or get worse. Document Released: 08/21/2005 Document Revised: 02/03/2012 Document Reviewed: 05/30/2011 [employer] Patient Information 2014 [employer].  

## 2014-01-06 NOTE — Telephone Encounter (Signed)
Duplicate message. 

## 2014-01-06 NOTE — Progress Notes (Signed)
Subjective:    Patient ID: Kristine [name], female    DOB: July 12, 1941, 73 y.o.   MRN: [id number]  HPI This chart was scribed for Aodhan Scheidt-MD by Celesta Gentile, Scribe. This patient was seen in room 5 and the patient's care was started at 10:47 AM.  HPI Comments: Kristine [name] is a 73 y.o. female with a h/o of HTN and hyperlipidemia who presents to the [facility] for a 48 hour recheck for dizziness, HA, and nausea and TRANSITION INTO CARE from recent ED evaluation.  Pt states she is still light headed, but feels better than she did on Tuesday.  She reports that if she looks straight ahead the dizziness does not occur, but if she looks down she becomes extremely dizzy.  She reports she has slight ringing in her ear, but denies otalgia.  Pt denies blurred vision, hearing changes, SOB, and weakness.  Pt denies tingling and numbness in her extremities.  Pt denies the room spinning, but states she is just light headed.  Pt states that she has been sleeping flat on her back, so she is unsure if rolling over in bed worsens her dizziness.  Pt also complains of nasal congestion and reports she still has a mild sinus pressure HA.  Pt was seen here on Tuesday, Feb. 10th, 2015.  I obtained a CBC, UA, glucose and were all normal.  I sent her to ED for evaluation to rule out possible stroke.  Pt underwent CT scan of head in ED and showed no acute intracranial abnormalities, stable cerebral atrophy, and chronic small vessel ischemic changes.  Pt discharged home with her family.  Pt takes Pravachol 10 mg, but states her Cardiologist informed her to take half the dosage.  She states since the switch she experienced a LUQ abdominal pain.  Pt states that she was able to prop herself on three pillows and sleep off the pain. Pt states the pain lasted about 5 hours.  Pt describes the pain as a constant mild ache.  Pt denies diaphoresis and feeling clammy with the abdominal pain.  Denies n/v/d/c; denies bloody  stools or black stools; denies urinary symptoms; no dysuria, urgency, hematuria, frequency.   Pt reports she has a [employer] stent.    Past Surgical History  Procedure Laterality Date  . Tonsillectomy  1950  . Tubal ligation  1980  . Nissen fundoplication  123456  . Total knee arthroplasty      right  . Joint replacement  9/10    rt total  . Foot surgery  2013    x2  . Abdominal hysterectomy  1996    RSO for menorrhagia.  . Coronary angioplasty with stent placement  06/21/2013    Family History  Problem Relation Age of Onset  . Cancer Mother     Pancreatic Cancer  . Hepatitis C Mother   . Heart disease Father     congestive heart failure  . Panic disorder Daughter   . Asthma Maternal Grandmother   . Emphysema Maternal Grandmother   . Cancer Maternal Grandfather     lung    History   Social History  . Marital Status: Divorced    Spouse Name: N/A    Number of Children: N/A  . Years of Education: N/A   Occupational History  . Not on file.   Social History Main Topics  . Smoking status: Never Smoker   . Smokeless tobacco: Never Used  . Alcohol Use: Yes  Comment: Occasional  . Drug Use: No  . Sexual Activity: No   Other Topics Concern  . Not on file   Social History Narrative   Exercise dancing 3 x times weekly for 1-2 hours    Allergies  Allergen Reactions  . Aspirin Other (See Comments)    Pt is prone to bleeding, ok with low dose coated aspirin  . Nsaids Other (See Comments)    Pt is prone to bleeding  . Statins Other (See Comments)    elevated LFT'S, tolerates 1/2 dose of zetia and 1/2 dose of pravastatin    Patient Active Problem List   Diagnosis Date Noted  . CAD (coronary artery disease), native coronary artery 06/27/2013  . Hyperlipidemia 06/27/2013  . Precordial pain 06/26/2013  . ST elevation myocardial infarction (STEMI) of anterolateral wall, initial episode of care 06/24/2013  . Hypertension   . Diverticulitis   . GERD  (gastroesophageal reflux disease)   . Barrett's esophagus   . Cystocele   . Rectocele     Review of Systems  Constitutional: Negative for fever and chills.  HENT: Positive for congestion (nasal). Negative for ear pain, rhinorrhea and sore throat.   Eyes: Negative for photophobia and visual disturbance.  Respiratory: Negative for cough, shortness of breath and wheezing.   Cardiovascular: Negative for chest pain.  Gastrointestinal: Positive for nausea and abdominal pain. Negative for vomiting, diarrhea, constipation, blood in stool, abdominal distention, anal bleeding and rectal pain.  Genitourinary: Negative for dysuria, urgency, frequency, hematuria and pelvic pain.  Musculoskeletal: Negative for back pain, gait problem and myalgias.  Skin: Negative for color change and rash.  Neurological: Positive for dizziness, light-headedness and headaches. Negative for tremors, seizures, syncope, facial asymmetry, speech difficulty, weakness and numbness.  Psychiatric/Behavioral: Negative for behavioral problems and confusion.      Objective:   Physical Exam  Constitutional: She is oriented to person, place, and time. She appears well-developed and well-nourished. No distress.  HENT:  Head: Normocephalic and atraumatic.  Right Ear: Hearing, tympanic membrane, external ear and ear canal normal.  Left Ear: Hearing, tympanic membrane, external ear and ear canal normal.  Nose: Rhinorrhea present. No mucosal edema. Right sinus exhibits frontal sinus tenderness. Left sinus exhibits frontal sinus tenderness.  Mouth/Throat: Uvula is midline and oropharynx is clear and moist. No oropharyngeal exudate, posterior oropharyngeal edema or posterior oropharyngeal erythema.  Eyes: Conjunctivae and EOM are normal. Pupils are equal, round, and reactive to light. Right eye exhibits no discharge. Left eye exhibits no discharge.  Neck: Neck supple. No tracheal deviation present.  Cardiovascular: Normal rate, regular  rhythm, normal heart sounds and intact distal pulses.  Exam reveals no gallop and no friction rub.   No murmur heard. Pulmonary/Chest: Effort normal and breath sounds normal. No respiratory distress. She has no wheezes. She has no rales.  Abdominal: Soft. Bowel sounds are normal. She exhibits no mass. There is no tenderness. There is no rebound and no guarding.  Musculoskeletal: Normal range of motion.  Lymphadenopathy:       Head (right side): No submandibular, no tonsillar, no preauricular, no posterior auricular and no occipital adenopathy present.       Head (left side): No submandibular, no tonsillar, no preauricular, no posterior auricular and no occipital adenopathy present.    She has no cervical adenopathy.       Right: No supraclavicular adenopathy present.       Left: No supraclavicular adenopathy present.  Neurological: She is alert and oriented to person,  place, and time. She has normal strength and normal reflexes. No cranial nerve deficit or sensory deficit. She exhibits normal muscle tone. She displays a negative Romberg sign. Coordination and gait normal.  Reflex Scores:      Patellar reflexes are 2+ on the right side and 2+ on the left side. Skin: Skin is warm and dry. No rash noted. She is not diaphoretic.  Psychiatric: She has a normal mood and affect. Her behavior is normal. Judgment and thought content normal.  Nursing note and vitals reviewed.   Triage Vitals: BP 124/80  Pulse 72  Temp(Src) 98 F (36.7 C) (Oral)  Resp 16  Ht 5' 0.5" (1.537 m)  Wt 124 lb (56.246 kg)  BMI 23.81 kg/m2  SpO2 98%  LMP 11/25/1994  Results for orders placed or performed during the hospital encounter of 01/04/14  Comprehensive metabolic panel  Result Value Ref Range   Sodium 144 137 - 147 mEq/L   Potassium 4.4 3.7 - 5.3 mEq/L   Chloride 105 96 - 112 mEq/L   CO2 27 19 - 32 mEq/L   Glucose, Bld 99 70 - 99 mg/dL   BUN 12 6 - 23 mg/dL   Creatinine, Ser 0.61 0.50 - 1.10 mg/dL    Calcium 9.8 8.4 - 10.5 mg/dL   Total Protein 7.1 6.0 - 8.3 g/dL   Albumin 3.8 3.5 - 5.2 g/dL   AST 17 0 - 37 U/L   ALT 16 0 - 35 U/L   Alkaline Phosphatase 101 39 - 117 U/L   Total Bilirubin 0.3 0.3 - 1.2 mg/dL   GFR calc non Af Amer 90 (L) >90 mL/min   GFR calc Af Amer >90 >90 mL/min      Assessment & Plan:  Dizziness - Plan: EKG 12-Lead, MR Brain W Wo Contrast  Headache(784.0) - Plan: EKG 12-Lead, MR Brain W Wo Contrast  Abdominal pain, LUQ - Plan: EKG 12-Lead  Nausea alone - Plan: EKG 12-Lead  Sinus congestion - Plan: EKG 12-Lead  Abdominal pain, left upper quadrant - Plan: EKG 12-Lead   1. Dizziness: persistent but improved; s/p ED evaluation with negative CT head; refer for MRI brain.  To ED again for acute worsening; will treat sinus congestion and monitor dizziness. Rx for Meclizine provided while undergoing work up.  If persists, refer to neurology. 2.  Headache:  Persistent but improved; s/p CT head in ED two days ago; refer for MRI brain.   3.  Abdominal pain LUQ: New.  Benign abdominal exam; stable EKG. 4.  Nausea: New; associated with dizziness; benign abdominal exam; normal labs this week. 5.  Sinus congestion: New.  Rx for Flonase provided to start daily.   Meds ordered this encounter  Medications  . DISCONTD: meclizine (ANTIVERT) 12.5 MG tablet    Sig: Take 1 tablet (12.5 mg total) by mouth 3 (three) times daily as needed for dizziness.    Dispense:  30 tablet    Refill:  0  . DISCONTD: fluticasone (FLONASE) 50 MCG/ACT nasal spray    Sig: Place 2 sprays into both nostrils daily.    Dispense:  16 g    Refill:  0    I personally performed the services described in this documentation, which was scribed in my presence.  The recorded information has been reviewed and is accurate.  Reginia Forts, M.D.  [facility] [address] [place], [place]  [phone] phone [phone] fax

## 2014-01-06 NOTE — Telephone Encounter (Signed)
Patient wants a call from Dr. Tamala Julian or clinical about her lightheadedness, she says she was seen recently and is unable to go to work because of it today. She would like something called in. Please advise.    Pharmacy: [place], [place]: [phone]

## 2014-01-06 NOTE — Telephone Encounter (Signed)
Celena from [facility] called requesting lab results that they had ordered for pt to be faxed to them. Any questions please call her at [phone]. Fax # [phone].

## 2014-01-10 ENCOUNTER — Telehealth: Payer: Self-pay

## 2014-01-10 NOTE — Telephone Encounter (Signed)
FYI

## 2014-01-10 NOTE — Telephone Encounter (Signed)
PT WANTED DR Tamala Julian TO KNOW SHE HAVE AN APPT FOR AN MRI ON Friday AT 7:00 IN THE EVENING AND SHE IS OK AS LONG AS SHE HOLD HER HEAD A CERTAIN WAY PLEASE CALL [phone] IF NEEDED

## 2014-01-11 NOTE — Telephone Encounter (Signed)
Celena CB to check status of results being faxed to them. Did not see any notes from [facility] and could not find MR staff at the time. Checked w/Tracy who authorized me to send the requested lab results for pt's continuity of care. [facility], Juluis Rainier, this was done.

## 2014-01-14 ENCOUNTER — Ambulatory Visit
Admission: RE | Admit: 2014-01-14 | Discharge: 2014-01-14 | Disposition: A | Discharge status: Home / Self Care | Payer: BC Managed Care – PPO | Source: Ambulatory Visit | Attending: Family Medicine | Admitting: Family Medicine

## 2014-01-14 DIAGNOSIS — R51 Headache: Secondary | ICD-10-CM

## 2014-01-14 DIAGNOSIS — R42 Dizziness and giddiness: Secondary | ICD-10-CM

## 2014-01-14 MED ORDER — GADOBENATE DIMEGLUMINE 529 MG/ML IV SOLN
12.0000 mL | Freq: Once | INTRAVENOUS | Status: AC | PRN
  Administered 2014-01-14: 12 mL via INTRAVENOUS

## 2014-01-18 ENCOUNTER — Telehealth: Payer: Self-pay

## 2014-01-18 NOTE — Telephone Encounter (Signed)
Patient is returning call about her MRI results.  [phone]

## 2014-01-19 NOTE — Telephone Encounter (Signed)
Pt notified of results

## 2014-03-08 ENCOUNTER — Emergency Department (HOSPITAL_BASED_OUTPATIENT_CLINIC_OR_DEPARTMENT_OTHER)
Admission: EM | Admit: 2014-03-08 | Discharge: 2014-03-08 | Disposition: A | Discharge status: Home / Self Care | Payer: BC Managed Care – PPO | Attending: Emergency Medicine | Admitting: Emergency Medicine

## 2014-03-08 ENCOUNTER — Ambulatory Visit (INDEPENDENT_AMBULATORY_CARE_PROVIDER_SITE_OTHER): Payer: BC Managed Care – PPO | Admitting: Family Medicine

## 2014-03-08 ENCOUNTER — Encounter (HOSPITAL_BASED_OUTPATIENT_CLINIC_OR_DEPARTMENT_OTHER): Payer: Self-pay | Admitting: Emergency Medicine

## 2014-03-08 VITALS — BP 112/68 | HR 68 | Temp 98.0°F | Resp 17 | Ht 61.0 in | Wt 124.0 lb

## 2014-03-08 DIAGNOSIS — Z862 Personal history of diseases of the blood and blood-forming organs and certain disorders involving the immune mechanism: Secondary | ICD-10-CM | POA: Insufficient documentation

## 2014-03-08 DIAGNOSIS — K2270 Barrett's esophagus without dysplasia: Secondary | ICD-10-CM | POA: Insufficient documentation

## 2014-03-08 DIAGNOSIS — Z9861 Coronary angioplasty status: Secondary | ICD-10-CM | POA: Insufficient documentation

## 2014-03-08 DIAGNOSIS — IMO0002 Reserved for concepts with insufficient information to code with codable children: Secondary | ICD-10-CM

## 2014-03-08 DIAGNOSIS — M129 Arthropathy, unspecified: Secondary | ICD-10-CM | POA: Insufficient documentation

## 2014-03-08 DIAGNOSIS — M79609 Pain in unspecified limb: Secondary | ICD-10-CM

## 2014-03-08 DIAGNOSIS — I2510 Atherosclerotic heart disease of native coronary artery without angina pectoris: Secondary | ICD-10-CM | POA: Insufficient documentation

## 2014-03-08 DIAGNOSIS — L03114 Cellulitis of left upper limb: Secondary | ICD-10-CM

## 2014-03-08 DIAGNOSIS — I252 Old myocardial infarction: Secondary | ICD-10-CM | POA: Insufficient documentation

## 2014-03-08 DIAGNOSIS — E785 Hyperlipidemia, unspecified: Secondary | ICD-10-CM | POA: Insufficient documentation

## 2014-03-08 DIAGNOSIS — M79602 Pain in left arm: Secondary | ICD-10-CM

## 2014-03-08 DIAGNOSIS — I7300 Raynaud's syndrome without gangrene: Secondary | ICD-10-CM | POA: Insufficient documentation

## 2014-03-08 DIAGNOSIS — Z7982 Long term (current) use of aspirin: Secondary | ICD-10-CM | POA: Insufficient documentation

## 2014-03-08 DIAGNOSIS — Z87448 Personal history of other diseases of urinary system: Secondary | ICD-10-CM | POA: Insufficient documentation

## 2014-03-08 DIAGNOSIS — K219 Gastro-esophageal reflux disease without esophagitis: Secondary | ICD-10-CM | POA: Insufficient documentation

## 2014-03-08 DIAGNOSIS — I10 Essential (primary) hypertension: Secondary | ICD-10-CM | POA: Insufficient documentation

## 2014-03-08 DIAGNOSIS — Z7902 Long term (current) use of antithrombotics/antiplatelets: Secondary | ICD-10-CM | POA: Insufficient documentation

## 2014-03-08 DIAGNOSIS — Z79899 Other long term (current) drug therapy: Secondary | ICD-10-CM | POA: Insufficient documentation

## 2014-03-08 LAB — D-DIMER, QUANTITATIVE: D-Dimer, Quant: 0.36 ug/mL-FEU (ref 0.00–0.48)

## 2014-03-08 MED ORDER — DOXYCYCLINE HYCLATE 100 MG PO CAPS: 100.0000 mg | ORAL_CAPSULE | Freq: Two times a day (BID) | ORAL | Status: DC

## 2014-03-08 MED ORDER — DOXYCYCLINE HYCLATE 100 MG PO TABS
100.0000 mg | ORAL_TABLET | Freq: Once | ORAL | Status: AC
  Administered 2014-03-08: 100 mg via ORAL
  Filled 2014-03-08: qty 1

## 2014-03-08 NOTE — ED Notes (Signed)
Denies pain  Also denies event or injury pt noted edema and red marks while taking shower.

## 2014-03-08 NOTE — ED Provider Notes (Signed)
CSN: [id number]     Arrival date & time 03/08/14  0028 History   First MD Initiated Contact with Patient 03/08/14 0251     Chief Complaint  Patient presents with  . Joint Swelling     (Consider location/radiation/quality/duration/timing/severity/associated sxs/prior Treatment) HPI This is a 73 year old female who noticed some swelling and erythema of her left antecubital fossa yesterday evening about 9:30 PM. There is no associated pain or tenderness. There is some associated induration of the underlying soft tissue. She's had no systemic symptoms such as fever, chills, nausea or vomiting. She is not aware of injuring it. Symptoms are mild at this point.  Past Medical History  Diagnosis Date  . GERD (gastroesophageal reflux disease)   . Barrett's esophagus     Stage B  . Cystocele   . Rectocele     mild  . Hypertension   . Diverticulitis   . Arthritis   . Raynaud disease   . CREST variant of scleroderma   . Osteopenia 09/2012    T score -2.4 FRAX 13%/2.7%  per 10/15/2012 discussion plan repeat in 2 years  . Anemia   . STEMI (ST elevation myocardial infarction) 06/21/2013  . CAD (coronary artery disease)   . Hyperlipidemia    Past Surgical History  Procedure Laterality Date  . Tonsillectomy  1950  . Tubal ligation  1980  . Nissen fundoplication  2025  . Total knee arthroplasty      right  . Joint replacement  9/10    rt total  . Foot surgery  2013    x2  . Abdominal hysterectomy  1996    RSO for menorrhagia.  . Coronary angioplasty with stent placement  06/21/2013   Family History  Problem Relation Age of Onset  . Cancer Mother     Pancreatic Cancer  . Hepatitis C Mother   . Heart disease Father     congestive heart failure  . Panic disorder Daughter   . Asthma Maternal Grandmother   . Emphysema Maternal Grandmother   . Cancer Maternal Grandfather     lung   History  Substance Use Topics  . Smoking status: Never Smoker   . Smokeless tobacco: Never Used  .  Alcohol Use: Yes     Comment: Occasional   OB History   Grav Para Term Preterm Abortions TAB SAB Ect Mult Living   1 1        1      Review of Systems  All other systems reviewed and are negative.   Allergies  Aspirin; Nsaids; and Statins  Home Medications   Current Outpatient Rx  Name  Route  Sig  Dispense  Refill  . acetaminophen (TYLENOL) 500 MG tablet   Oral   Take 1,000 mg by mouth 2 (two) times daily as needed (pain).         Marland Kitchen aspirin EC 81 MG EC tablet   Oral   Take 1 tablet (81 mg total) by mouth daily.         Marland Kitchen ezetimibe (ZETIA) 10 MG tablet   Oral   Take 5 mg by mouth daily.         Marland Kitchen lisinopril (PRINIVIL,ZESTRIL) 10 MG tablet   Oral   Take 1 tablet (10 mg total) by mouth daily.   90 tablet   3     Rx has expired - no refills remain   . Multiple Vitamin (MULTIVITAMIN WITH MINERALS) TABS   Oral   Take 1  tablet by mouth daily.          . nitroGLYCERIN (NITROSTAT) 0.4 MG SL tablet   Sublingual   Place 1 tablet (0.4 mg total) under the tongue every 5 (five) minutes x 3 doses as needed for chest pain.   25 tablet   12   . omeprazole (PRILOSEC) 20 MG capsule   Oral   Take 20 mg by mouth 2 (two) times daily.           . Ticagrelor (BRILINTA) 90 MG TABS tablet   Oral   Take 1 tablet (90 mg total) by mouth 2 (two) times daily.   60 tablet   11    BP 117/63  Temp(Src) 98.3 F (36.8 C) (Oral)  Resp 16  Ht 5\' 4"  (1.626 m)  Wt 125 lb (56.7 kg)  BMI 21.45 kg/m2  SpO2 100%  LMP 11/25/1994  Physical Exam General: Well-developed, well-nourished female in no acute distress; appearance consistent with age of record HENT: normocephalic; atraumatic Eyes: pupils equal, round and reactive to light; extraocular muscles intact Neck: supple Heart: regular rate and rhythm Lungs: clear to auscultation bilaterally Abdomen: soft; nondistended; nontender; no masses or hepatosplenomegaly; bowel sounds present Extremities: No deformity; full range of  motion; pulses normal; mild swelling and slight erythema of the medial left antecubital fossa without tenderness, some induration of underlying soft tissue    Neurologic: Awake, alert and oriented; motor function intact in all extremities and symmetric; no facial droop Skin: Warm and dry Psychiatric: Normal mood and affect    ED Course  Procedures (including critical care time)   MDM   Nursing notes and vitals signs, including pulse oximetry, reviewed.  Summary of this visit's results, reviewed by myself:  Labs:  Results for orders placed during the hospital encounter of 03/08/14 (from the past 24 hour(s))  D-DIMER, QUANTITATIVE     Status: None   Collection Time    03/08/14  3:30 AM      Result Value Ref Range   D-Dimer, Quant 0.36  0.00 - 0.48 ug/mL-FEU   3:59 AM D-dimer is reassuring. We will place her on an antibiotic for possible early cellulitis.     Wynetta Fines, MD 03/08/14 [phone]

## 2014-03-08 NOTE — Progress Notes (Signed)
Subjective:     Patient ID: Kristine [name], female   DOB: 09/05/1941, 73 y.o.   MRN: [id number]  HPI 73 yr female presents for ED follow-up.  Went last night at midnight due to enlarged spot on arm with associated redness and "streaking'.  Was tx for cellulitis. States she woke up this morning and the spot was much improved.    Review of Systems  Constitutional: Negative for fever, chills and fatigue.  Respiratory: Negative for chest tightness, shortness of breath and wheezing.   Cardiovascular: Negative for chest pain and palpitations.  Gastrointestinal: Negative for vomiting, abdominal pain, diarrhea and constipation.  Genitourinary: Negative for hematuria and difficulty urinating.  Musculoskeletal: Negative for arthralgias and back pain.  Skin: Positive for rash.  Neurological: Negative for dizziness, numbness and headaches.       Objective:   Physical Exam  Constitutional: She is oriented to person, place, and time. She appears well-developed and well-nourished.  HENT:  Head: Normocephalic and atraumatic.  Eyes: Pupils are equal, round, and reactive to light.  Cardiovascular: Normal rate and regular rhythm.   Pulmonary/Chest: Effort normal and breath sounds normal.  Musculoskeletal: Normal range of motion.  Neurological: She is alert and oriented to person, place, and time.  Skin: Skin is warm and dry. No rash noted. No erythema. No pallor.   Filed Vitals:   03/08/14 1955  BP: 112/68  Pulse: 68  Temp: 98 F (36.7 C)  Resp: 17        Assessment:    1. Possible cellulitis - seems to be resolved    Plan:    1. Pt given reassurance and warning sign for worsening cellulitis

## 2014-03-08 NOTE — ED Notes (Signed)
Noticed a swelling in left elbow/ac tonight

## 2014-03-09 NOTE — Progress Notes (Signed)
History and physical examinations obtained with Ilean Skill, Medical Student.  ED note reviewed in detail (d-dimer negative).  L antecubital region with slight excoriation in linear distribution without associated erythema, induration.  Mild warmth along medial aspect of antecubital region with mild induration without erythema. No streaking.  L radial pulse 2+ and equal to R: capillary refill < 3 seconds.  A/P:  L antecubital early cellulitis likely due to superficial laceration/excoriation:  Improving with Doxycycline. Complete therapy; RTC for fever, redness, pain, swelling.  No suggestive of DVT.

## 2014-03-13 ENCOUNTER — Ambulatory Visit (INDEPENDENT_AMBULATORY_CARE_PROVIDER_SITE_OTHER): Payer: BC Managed Care – PPO | Admitting: Emergency Medicine

## 2014-03-13 VITALS — BP 128/72 | HR 69 | Temp 98.0°F | Resp 16 | Ht 61.0 in | Wt 124.0 lb

## 2014-03-13 DIAGNOSIS — R42 Dizziness and giddiness: Secondary | ICD-10-CM

## 2014-03-13 LAB — GLUCOSE, POCT (MANUAL RESULT ENTRY): POC GLUCOSE: 93 mg/dL (ref 70–99)

## 2014-03-13 NOTE — Progress Notes (Signed)
   Subjective:    Patient ID: Kristine [name], female    DOB: 1941/08/15, 73 y.o.   MRN: [id number]  HPI  Patient states that she is constantly getting lightheaded.  She feels like she can't even walk straight.  Patient did have BP checked lying down and sitting up and her BP did drop when she sat up.  Patient did go to ED and was diagnosed with positional hypertension.  This has happened twice in two months.  She saw Dr. Tamala Julian and got meclizine and antivert.  Today she feels okay and she did feel okay yesterday.    Review of Systems     Objective:   Physical Exam patient is alert and cooperative she is in no distress. Her neck is supple chest clear to auscultation percussion heart regular rate no murmurs. Extremities are without edema  Results for orders placed in visit on 03/13/14  GLUCOSE, POCT (MANUAL RESULT ENTRY)      Result Value Ref Range   POC Glucose 93  70 - 99 mg/dl        Assessment & Plan:  I do not think she is having true orthostatic symptoms. I do think she has episodes of orthostatic hypotension. I decreased her lisinopril to half tablet a day. Hold for a copy of this to Dr. Burt Knack. I also made a referral to Dr. Thornell Mule  for his evaluation to see if he feels like  this is related to inner ear.

## 2014-03-13 NOTE — Patient Instructions (Signed)
Decrease  Your lisinopril to one half tablet a day. I have made a referral for you to see ENT to get their opinion regarding these episodes of lightheadedness. I would also like you to followup with Dr. Burt Knack.

## 2014-04-05 ENCOUNTER — Ambulatory Visit: Payer: BC Managed Care – PPO

## 2014-04-05 ENCOUNTER — Ambulatory Visit (INDEPENDENT_AMBULATORY_CARE_PROVIDER_SITE_OTHER): Payer: BC Managed Care – PPO | Admitting: Family Medicine

## 2014-04-05 VITALS — BP 136/78 | HR 99 | Temp 97.6°F | Resp 14 | Ht 60.0 in | Wt 128.8 lb

## 2014-04-05 DIAGNOSIS — M25559 Pain in unspecified hip: Secondary | ICD-10-CM

## 2014-04-05 DIAGNOSIS — M533 Sacrococcygeal disorders, not elsewhere classified: Secondary | ICD-10-CM

## 2014-04-05 DIAGNOSIS — M549 Dorsalgia, unspecified: Secondary | ICD-10-CM

## 2014-04-05 DIAGNOSIS — T148XXA Other injury of unspecified body region, initial encounter: Secondary | ICD-10-CM

## 2014-04-05 NOTE — Patient Instructions (Signed)
Contusion °A contusion is a deep bruise. Contusions are the result of an injury that caused bleeding under the skin. The contusion may turn blue, purple, or yellow. Minor injuries will give you a painless contusion, but more severe contusions may stay painful and swollen for a few weeks.  °CAUSES  °A contusion is usually caused by a blow, trauma, or direct force to an area of the body. °SYMPTOMS  °· Swelling and redness of the injured area. °· Bruising of the injured area. °· Tenderness and soreness of the injured area. °· Pain. °DIAGNOSIS  °The diagnosis can be made by taking a history and physical exam. An X-ray, CT scan, or MRI may be needed to determine if there were any associated injuries, such as fractures. °TREATMENT  °Specific treatment will depend on what area of the body was injured. In general, the best treatment for a contusion is resting, icing, elevating, and applying cold compresses to the injured area. Over-the-counter medicines may also be recommended for pain control. Ask your caregiver what the best treatment is for your contusion. °HOME CARE INSTRUCTIONS  °· Put ice on the injured area. °· Put ice in a plastic bag. °· Place a towel between your skin and the bag. °· Leave the ice on for 15-20 minutes, 03-04 times a day. °· Only take over-the-counter or prescription medicines for pain, discomfort, or fever as directed by your caregiver. Your caregiver may recommend avoiding anti-inflammatory medicines (aspirin, ibuprofen, and naproxen) for 48 hours because these medicines may increase bruising. °· Rest the injured area. °· If possible, elevate the injured area to reduce swelling. °SEEK IMMEDIATE MEDICAL CARE IF:  °· You have increased bruising or swelling. °· You have pain that is getting worse. °· Your swelling or pain is not relieved with medicines. °MAKE SURE YOU:  °· Understand these instructions. °· Will watch your condition. °· Will get help right away if you are not doing well or get  worse. °Document Released: 08/21/2005 Document Revised: 02/03/2012 Document Reviewed: 09/16/2011 °[employer]® Patient Information ©2014 [employer]. ° °

## 2014-04-05 NOTE — Progress Notes (Signed)
Chief Complaint:  Chief Complaint  Patient presents with  . Fall    fell on saturday, bruised    HPI: Kristine [name] is a 73 y.o. female who is here for  Tailbone and buttock pain after she fell down on the stairs of a [place] restaurant at her grandson's graduation dinner 4 days ago.  She has osteopenia . She complains of Soreness, pain and dicciculty sitting for long periods of time. She has a large brusie on her tailbone and right hip paina nd swelling. She has scoliosis as well. She states she landed directly on her tailbone when she fell and on her back. Throbbing aching pain, moderate pain. She has tried  tried epson salt without relief. The right hip hurts her when she walks, this was not a problem before.    Past Medical History  Diagnosis Date  . GERD (gastroesophageal reflux disease)   . Barrett's esophagus     Stage B  . Cystocele   . Rectocele     mild  . Hypertension   . Diverticulitis   . Arthritis   . Raynaud disease   . CREST variant of scleroderma   . Osteopenia 09/2012    T score -2.4 FRAX 13%/2.7%  per 10/15/2012 discussion plan repeat in 2 years  . Anemia   . STEMI (ST elevation myocardial infarction) 06/21/2013  . CAD (coronary artery disease)   . Hyperlipidemia    Past Surgical History  Procedure Laterality Date  . Tonsillectomy  1950  . Tubal ligation  1980  . Nissen fundoplication  1610  . Total knee arthroplasty      right  . Joint replacement  9/10    rt total  . Foot surgery  2013    x2  . Abdominal hysterectomy  1996    RSO for menorrhagia.  . Coronary angioplasty with stent placement  06/21/2013   History   Social History  . Marital Status: Divorced    Spouse Name: N/A    Number of Children: N/A  . Years of Education: N/A   Social History Main Topics  . Smoking status: Never Smoker   . Smokeless tobacco: Never Used  . Alcohol Use: Yes     Comment: Occasional  . Drug Use: No  . Sexual Activity: No   Other Topics Concern    . None   Social History Narrative   Exercise dancing 3 x times weekly for 1-2 hours   Family History  Problem Relation Age of Onset  . Cancer Mother     Pancreatic Cancer  . Hepatitis C Mother   . Heart disease Father     congestive heart failure  . Panic disorder Daughter   . Asthma Maternal Grandmother   . Emphysema Maternal Grandmother   . Cancer Maternal Grandfather     lung   Allergies  Allergen Reactions  . Aspirin Other (See Comments)    Pt is prone to bleeding, ok with low dose coated aspirin  . Nsaids Other (See Comments)    Pt is prone to bleeding  . Statins Other (See Comments)    elevated LFT'S, tolerates 1/2 dose of zetia and 1/2 dose of pravastatin   Prior to Admission medications   Medication Sig Start Date End Date Taking? Authorizing Provider  aspirin EC 81 MG EC tablet Take 1 tablet (81 mg total) by mouth daily. 06/24/13  Yes Rhonda G Barrett, PA-C  ezetimibe (ZETIA) 10 MG tablet Take 5 mg  by mouth daily.   Yes Historical Provider, MD  lisinopril (PRINIVIL,ZESTRIL) 10 MG tablet Take 1 tablet (10 mg total) by mouth daily. 06/21/13  Yes Gay Filler Copland, MD  Multiple Vitamin (MULTIVITAMIN WITH MINERALS) TABS Take 1 tablet by mouth daily.    Yes Historical Provider, MD  nitroGLYCERIN (NITROSTAT) 0.4 MG SL tablet Place 1 tablet (0.4 mg total) under the tongue every 5 (five) minutes x 3 doses as needed for chest pain. 06/24/13  Yes Rhonda G Barrett, PA-C  omeprazole (PRILOSEC) 20 MG capsule Take 20 mg by mouth 2 (two) times daily.     Yes Historical Provider, MD  Ticagrelor (BRILINTA) 90 MG TABS tablet Take 1 tablet (90 mg total) by mouth 2 (two) times daily. 06/24/13  Yes Rhonda G Barrett, PA-C  doxycycline (VIBRAMYCIN) 100 MG capsule Take 1 capsule (100 mg total) by mouth 2 (two) times daily. One po bid x 7 days 03/08/14   Wynetta Fines, MD  meclizine (ANTIVERT) 25 MG tablet Take 25 mg by mouth 3 (three) times daily as needed for dizziness.    Historical Provider, MD      ROS: The patient denies fevers, chills, night sweats, unintentional weight loss, chest pain, palpitations, wheezing, dyspnea on exertion, nausea, vomiting, abdominal pain, dysuria, hematuria, melena, numbness, weakness, or tingling.   All other systems have been reviewed and were otherwise negative with the exception of those mentioned in the HPI and as above.    PHYSICAL EXAM: Filed Vitals:   04/05/14 1918  BP: 136/78  Pulse: 99  Temp: 97.6 F (36.4 C)  Resp: 14   Filed Vitals:   04/05/14 1918  Height: 5' (1.524 m)  Weight: 128 lb 12.8 oz (58.423 kg)   Body mass index is 25.15 kg/(m^2).  General: Alert, no acute distress HEENT:  Normocephalic, atraumatic, oropharynx patent. EOMI, PERRLA Cardiovascular:  Regular rate and rhythm, no rubs murmurs or gallops.  No Carotid bruits, radial pulse intact. No pedal edema.  Respiratory: Clear to auscultation bilaterally.  No wheezes, rales, or rhonchi.  No cyanosis, no use of accessory musculature GI: No organomegaly, abdomen is soft and non-tender, positive bowel sounds.  No masses. Skin: No rashes. Neurologic: Facial musculature symmetric. Psychiatric: Patient is appropriate throughout our interaction. Lymphatic: No cervical lymphadenopathy Musculoskeletal: Gait intact. + scoliosis + paramsk tenderness  At buttock with large ecchymosis on left coccyx area Decrease ROM due to pain 5/5 strength, 2/2 DTRs No saddle anesthesia Straight leg negative Right hip swelling, tenderness, good ER and IR    LABS: Results for orders placed in visit on 03/13/14  GLUCOSE, POCT (MANUAL RESULT ENTRY)      Result Value Ref Range   POC Glucose 93  70 - 99 mg/dl     EKG/XRAY:   Primary read interpreted by Dr. Marin Comment at [facility]. + scoliosis, severe DJD L spine, please comment if L2-3 is an acute fx or chronic changes Hip neg for fx/dislocation ? cocyx fracture or shadow     ASSESSMENT/PLAN: Encounter Diagnoses  Name Primary?  . Back pain  Yes  . Hip pain   . Contusion of unspecified site   . Coccyx pain    Contusion vs possible coccyx fractue, will await for official radiology results.  No change in management. Continue with epson salt or tylenol prn Will get official results and call and mail them to her. She wants copies for insurance purposes, would like the [place] restaaurant to pay for her OV if possible F/u prn  Gross sideeffects, risk and benefits, and alternatives of medications d/w patient. Patient is aware that all medications have potential sideeffects and we are unable to predict every sideeffect or drug-drug interaction that may occur.  Glenford Bayley, DO 04/05/2014 8:51 PM

## 2014-04-06 ENCOUNTER — Telehealth: Payer: Self-pay

## 2014-04-06 NOTE — Telephone Encounter (Signed)
PT STATES DR LE WAS GOING TO SPEAK WITH THE RADIOLOGIST AND GET BACK WITH HER, SHE HASN'T HEARD FROM ANYONE PLEASE CALL PT AT [phone]

## 2014-04-06 NOTE — Telephone Encounter (Signed)
See xray reports.

## 2014-04-14 ENCOUNTER — Telehealth: Payer: Self-pay

## 2014-04-14 NOTE — Telephone Encounter (Signed)
Dr Everlene Farrier decreased the patients lisinopril (PRINIVIL,ZESTRIL) 10 MG tablet    Kristine [name] at [facility]  Requesting a new script   [phone]

## 2014-04-15 MED ORDER — LISINOPRIL 5 MG PO TABS: 5.0000 mg | ORAL_TABLET | Freq: Every day | ORAL | Status: DC

## 2014-04-15 NOTE — Telephone Encounter (Signed)
Sent in Rx for 5 mg tablets per Dr Perfecto Kingdom inst's for pt to just take total of 5 mg QD in last OV notes. Notified pt.

## 2014-04-15 NOTE — Telephone Encounter (Signed)
Patient called back. States that pharmacy will not fill a 5mg  lisinopril tablet without prior approval. Patient states that when she cuts the 10mg  pill in half, the other half crumbles and she is wasting the medication. Patient states that as of today, she is out of the lisinopril. Please return call and advise. Thank you!

## 2014-07-01 ENCOUNTER — Encounter: Payer: Self-pay | Admitting: Cardiovascular Disease

## 2014-07-01 ENCOUNTER — Ambulatory Visit (INDEPENDENT_AMBULATORY_CARE_PROVIDER_SITE_OTHER): Payer: BC Managed Care – PPO | Admitting: Cardiovascular Disease

## 2014-07-01 ENCOUNTER — Other Ambulatory Visit (HOSPITAL_COMMUNITY): Payer: Self-pay | Admitting: Physician Assistant

## 2014-07-01 VITALS — BP 133/71 | HR 63 | Ht 60.0 in | Wt 126.0 lb

## 2014-07-01 DIAGNOSIS — I10 Essential (primary) hypertension: Secondary | ICD-10-CM

## 2014-07-01 DIAGNOSIS — I2510 Atherosclerotic heart disease of native coronary artery without angina pectoris: Secondary | ICD-10-CM

## 2014-07-01 DIAGNOSIS — E785 Hyperlipidemia, unspecified: Secondary | ICD-10-CM

## 2014-07-01 DIAGNOSIS — I25810 Atherosclerosis of coronary artery bypass graft(s) without angina pectoris: Secondary | ICD-10-CM

## 2014-07-01 NOTE — Patient Instructions (Signed)
Your physician has recommended you make the following change in your medication: STOP Brilinta  Your physician recommends that you return for a FASTING LIPOMED and Liver Profile in September--nothing to eat or drink after midnight, lab opens at 7:30 AM  Your physician wants you to follow-up in: 1 YEAR with Dr Burt Knack.  You will receive a reminder letter in the mail two months in advance. If you don't receive a letter, please call our office to schedule the follow-up appointment.  Please continue your current exercise regimen as this is beneficial to your cardiac health.   Please call the office if you would like to pursue sleep study.

## 2014-07-01 NOTE — Progress Notes (Signed)
HPI:  73 year old woman presenting for followup evaluation. The patient has coronary artery disease and she presented with an acute lateral ST elevation MI 06/21/2013. She underwent PCI to left circumflex with a drug-eluting stent. She had some residual CAD with medical therapy recommended.   Her cardiac catheterization demonstrated occlusion of the small diagonal branch with left to left collaterals. There is 90% stenosis in the first obtuse marginal branch and this was treated with PCI. The right coronary artery had moderate 50-60% stenosis. The LAD was widely patent as was the left main. The patient's left ventricular ejection fraction was initially 35-40%. On followup echocardiography her ejection fraction had improved to 50-55%.  The patient is doing well. She is dancing regularly for exercise. She really enjoys this and it has been good for her overall well-being. She has no symptoms of chest pain, chest pressure, or shortness of breath. She is tolerating her medications well. She does admit to easy bruising and bleeding on dual antiplatelet therapy. She is due for surveillance endoscopy of Barrett's esophagus and asks about whether she can stop brilinta. She notes poor sleep, sleeping only about 5 hours per night.  Outpatient Encounter Prescriptions as of 07/01/2014  Medication Sig  . aspirin EC 81 MG EC tablet Take 1 tablet (81 mg total) by mouth daily.  Marland Kitchen doxycycline (VIBRAMYCIN) 100 MG capsule Prn dental  . ezetimibe (ZETIA) 10 MG tablet Take 5 mg by mouth daily.  Marland Kitchen lisinopril (PRINIVIL,ZESTRIL) 5 MG tablet Take 1 tablet (5 mg total) by mouth daily.  . Multiple Vitamin (MULTIVITAMIN WITH MINERALS) TABS Take 1 tablet by mouth daily.   . nitroGLYCERIN (NITROSTAT) 0.4 MG SL tablet Place 1 tablet (0.4 mg total) under the tongue every 5 (five) minutes x 3 doses as needed for chest pain.  . NON FORMULARY FINITI ( ANTI-AGING) EVERY OTHER DAY  . omeprazole (PRILOSEC) 20 MG capsule Take 20 mg by  mouth 2 (two) times daily.    . pravastatin (PRAVACHOL) 10 MG tablet Take 5 mg by mouth.   . Ticagrelor (BRILINTA) 90 MG TABS tablet Take 1 tablet (90 mg total) by mouth 2 (two) times daily.  . [DISCONTINUED] doxycycline (VIBRAMYCIN) 100 MG capsule Take 1 capsule (100 mg total) by mouth 2 (two) times daily. One po bid x 7 days  . [DISCONTINUED] meclizine (ANTIVERT) 25 MG tablet Take 25 mg by mouth 3 (three) times daily as needed for dizziness.    Allergies  Allergen Reactions  . Aspirin Other (See Comments)    Pt is prone to bleeding, ok with low dose coated aspirin  . Nsaids Other (See Comments)    Pt is prone to bleeding  . Statins Other (See Comments)    elevated LFT'S, tolerates 1/2 dose of zetia and 1/2 dose of pravastatin    Past Medical History  Diagnosis Date  . GERD (gastroesophageal reflux disease)   . Barrett's esophagus     Stage B  . Cystocele   . Rectocele     mild  . Hypertension   . Diverticulitis   . Arthritis   . Raynaud disease   . CREST variant of scleroderma   . Osteopenia 09/2012    T score -2.4 FRAX 13%/2.7%  per 10/15/2012 discussion plan repeat in 2 years  . Anemia   . STEMI (ST elevation myocardial infarction) 06/21/2013  . CAD (coronary artery disease)   . Hyperlipidemia     ROS: Negative except as per HPI  BP 133/71  Pulse  63  Ht 5' (1.524 m)  Wt 126 lb (57.153 kg)  BMI 24.61 kg/m2  LMP 11/25/1994  PHYSICAL EXAM: Pt is alert and oriented, NAD HEENT: normal Neck: JVP - normal, carotids 2+= without bruits Lungs: CTA bilaterally CV: RRR without murmur or gallop Abd: soft, NT, Positive BS, no hepatomegaly Ext: no C/C/E, distal pulses intact and equal Skin: warm/dry no rash  EKG:  Normal sinus rhythm, incomplete right bundle branch block.  ASSESSMENT AND PLAN: 1. CAD, native vessel. Stable without symptoms of angina. The patient is now greater than 12 months out for myocardial infarction. We discussed the pros and cons of long-term  dual antiplatelet therapy. I think it is reasonable to discontinue brilinta at this point. She should remain on indefinite aspirin. Otherwise will continue her current medical program. I encouraged her to continue her regular exercise with dancing.  2. HTN: BP controlled.   3. Hyperlipidemia: Lipid-lowering therapy limited by history of elevated LFTs. She is on very low doses of pravastatin and zetia. Will draw an NMR panel after she returns from vacation in September. She continues to be diligent about diet and exercise.  For followup I will see her back in one year.  [name] MD 07/01/2014 9:48 AM

## 2014-07-11 ENCOUNTER — Telehealth: Payer: Self-pay | Admitting: Cardiovascular Disease

## 2014-07-11 NOTE — Telephone Encounter (Signed)
I spoke with the pt and made her aware that she can proceed with endoscopy as planned.

## 2014-07-11 NOTE — Telephone Encounter (Signed)
New message     Pt is having an endoscopy tomorrow in [place].  She has been taking her 81mg  aspirin--took last one this am.  Can she still have her endoscopy?  Doctor in [place] told her to call us and ask [place].

## 2014-07-12 DIAGNOSIS — Z955 Presence of coronary angioplasty implant and graft: Secondary | ICD-10-CM | POA: Insufficient documentation

## 2014-07-15 ENCOUNTER — Telehealth: Payer: Self-pay | Admitting: Internal Medicine

## 2014-07-15 NOTE — Telephone Encounter (Signed)
Kristine [name] called tonight concerned about her antiplatelet regimen.  She had a DES placed a little over 1 year ago by Dr. Burt Knack.  She had an endoscopy performed at [facility] on 8/17 and biopsies were taken for barrett's esophagus.  She is off ticagralor, and was told to hold her ASA 1 day before the procedure and for 7-10 days after.  I advised her to restart her aspirin at 81mg  tonight (likely had full platelet inhibition from aspirin over this past week anyway).

## 2014-07-19 ENCOUNTER — Telehealth: Payer: Self-pay | Admitting: Cardiovascular Disease

## 2014-07-19 NOTE — Telephone Encounter (Signed)
New message      Pt had an endoscopy last Tuesday--they found some abn cells and want to "zap" them.  Because she had a heart attack July 2014, they want to wait until until Jan to zap them.  Will it be ok with Dr Burt Knack to have the cells zapped now--pt do not want to wait until jan because the cells may be worse---cancerous.

## 2014-07-20 NOTE — Telephone Encounter (Signed)
I spoke with the pt and she will have her records faxed to our office for Dr Burt Knack to review and determine if she can proceed with radiation treatment to treat abnormal cells in esophagus.

## 2014-07-20 NOTE — Telephone Encounter (Signed)
Left message on machine for pt to contact the office.  I also left a message for the pt to request records from GI physician so that we can review endoscopy and be able to make a recommendation.

## 2014-07-26 ENCOUNTER — Encounter: Payer: Self-pay | Admitting: Cardiovascular Disease

## 2014-07-26 NOTE — Telephone Encounter (Signed)
New problem   Pt want to speak to nurse concerning getting an appt w/Dr Burt Knack for surgical clearance. Please call pt.

## 2014-07-26 NOTE — Telephone Encounter (Signed)
This encounter was created in error - please disregard.

## 2014-07-29 ENCOUNTER — Other Ambulatory Visit (INDEPENDENT_AMBULATORY_CARE_PROVIDER_SITE_OTHER): Payer: BC Managed Care – PPO

## 2014-07-29 DIAGNOSIS — E785 Hyperlipidemia, unspecified: Secondary | ICD-10-CM

## 2014-07-29 DIAGNOSIS — I10 Essential (primary) hypertension: Secondary | ICD-10-CM

## 2014-07-29 DIAGNOSIS — I2510 Atherosclerotic heart disease of native coronary artery without angina pectoris: Secondary | ICD-10-CM

## 2014-07-29 LAB — HEPATIC FUNCTION PANEL
ALT: 33 U/L (ref 0–35)
AST: 29 U/L (ref 0–37)
Albumin: 3.7 g/dL (ref 3.5–5.2)
Alkaline Phosphatase: 89 U/L (ref 39–117)
Bilirubin, Direct: 0 mg/dL (ref 0.0–0.3)
TOTAL PROTEIN: 6.6 g/dL (ref 6.0–8.3)
Total Bilirubin: 0.3 mg/dL (ref 0.2–1.2)

## 2014-07-29 NOTE — Telephone Encounter (Signed)
Dr Burt Knack reviewed notes from Dr Renford Dills at [facility].  The pt is scheduled for consultation and first ablative treatment for Barrett's esophagus on 08/23/14.  Per Dr Burt Knack the pt is okay to hold Aspirin one week before and one week after treatment if necessary. Pt aware.

## 2014-08-01 LAB — NMR LIPOPROFILE WITH LIPIDS
Cholesterol, Total: 153 mg/dL (ref 100–199)
HDL Particle Number: 30.6 umol/L (ref >=30.5)
HDL SIZE: 9.2 nm (ref >=9.2)
HDL-C: 49 mg/dL (ref >39)
LDL CALC: 82 mg/dL (ref 0–99)
LDL Particle Number: 1005 nmol/L — ABNORMAL HIGH (ref <1000)
LDL Size: 21 nm (ref >=20.8)
Large HDL-P: 7.2 umol/L (ref >=4.8)
Large VLDL-P: 0.8 nmol/L (ref <=2.7)
Small LDL Particle Number: 305 nmol/L (ref <=527)
Triglycerides: 112 mg/dL (ref 0–149)
VLDL SIZE: 41.9 nm (ref <=46.6)

## 2014-08-19 ENCOUNTER — Ambulatory Visit (INDEPENDENT_AMBULATORY_CARE_PROVIDER_SITE_OTHER): Payer: BC Managed Care – PPO

## 2014-08-19 ENCOUNTER — Ambulatory Visit (INDEPENDENT_AMBULATORY_CARE_PROVIDER_SITE_OTHER): Payer: BC Managed Care – PPO | Admitting: Family Medicine

## 2014-08-19 ENCOUNTER — Encounter (HOSPITAL_COMMUNITY): Payer: Self-pay

## 2014-08-19 ENCOUNTER — Telehealth: Payer: Self-pay | Admitting: Family Medicine

## 2014-08-19 ENCOUNTER — Ambulatory Visit (HOSPITAL_COMMUNITY)
Admission: RE | Admit: 2014-08-19 | Discharge: 2014-08-19 | Disposition: A | Discharge status: Home / Self Care | Payer: BC Managed Care – PPO | Source: Ambulatory Visit | Attending: Family Medicine | Admitting: Family Medicine

## 2014-08-19 VITALS — BP 108/68 | HR 76 | Temp 98.0°F | Resp 17 | Ht 61.0 in | Wt 127.0 lb

## 2014-08-19 DIAGNOSIS — K5730 Diverticulosis of large intestine without perforation or abscess without bleeding: Secondary | ICD-10-CM | POA: Insufficient documentation

## 2014-08-19 DIAGNOSIS — K449 Diaphragmatic hernia without obstruction or gangrene: Secondary | ICD-10-CM | POA: Diagnosis not present

## 2014-08-19 DIAGNOSIS — Z23 Encounter for immunization: Secondary | ICD-10-CM

## 2014-08-19 DIAGNOSIS — R1011 Right upper quadrant pain: Secondary | ICD-10-CM

## 2014-08-19 DIAGNOSIS — R1031 Right lower quadrant pain: Secondary | ICD-10-CM | POA: Diagnosis present

## 2014-08-19 DIAGNOSIS — R630 Anorexia: Secondary | ICD-10-CM

## 2014-08-19 DIAGNOSIS — R197 Diarrhea, unspecified: Secondary | ICD-10-CM

## 2014-08-19 LAB — COMPREHENSIVE METABOLIC PANEL
ALT: 15 U/L (ref 0–35)
AST: 16 U/L (ref 0–37)
Albumin: 4.3 g/dL (ref 3.5–5.2)
Alkaline Phosphatase: 88 U/L (ref 39–117)
BILIRUBIN TOTAL: 0.4 mg/dL (ref 0.2–1.2)
BUN: 12 mg/dL (ref 6–23)
CO2: 30 mEq/L (ref 19–32)
CREATININE: 0.82 mg/dL (ref 0.50–1.10)
Calcium: 9.8 mg/dL (ref 8.4–10.5)
Chloride: 103 mEq/L (ref 96–112)
Glucose, Bld: 87 mg/dL (ref 70–99)
Potassium: 4.5 mEq/L (ref 3.5–5.3)
Sodium: 138 mEq/L (ref 135–145)
Total Protein: 6.5 g/dL (ref 6.0–8.3)

## 2014-08-19 LAB — POCT CBC
Granulocyte percent: 66.8 %G (ref 37–80)
HEMATOCRIT: 41.9 % (ref 37.7–47.9)
Hemoglobin: 13.6 g/dL (ref 12.2–16.2)
LYMPH, POC: 2 (ref 0.6–3.4)
MCH, POC: 28.4 pg (ref 27–31.2)
MCHC: 32.4 g/dL (ref 31.8–35.4)
MCV: 87.8 fL (ref 80–97)
MID (CBC): 0.7 (ref 0–0.9)
MPV: 7.5 fL (ref 0–99.8)
POC GRANULOCYTE: 5.5 (ref 2–6.9)
POC LYMPH %: 24.5 % (ref 10–50)
POC MID %: 8.7 %M (ref 0–12)
Platelet Count, POC: 151 10*3/uL (ref 142–424)
RBC: 4.77 M/uL (ref 4.04–5.48)
RDW, POC: 15.8 %
WBC: 8.2 10*3/uL (ref 4.6–10.2)

## 2014-08-19 LAB — POCT URINALYSIS DIPSTICK
BILIRUBIN UA: NEGATIVE
Blood, UA: NEGATIVE
Glucose, UA: NEGATIVE
KETONES UA: NEGATIVE
Nitrite, UA: NEGATIVE
Protein, UA: NEGATIVE
Spec Grav, UA: <=1.005
Urobilinogen, UA: 0.2
pH, UA: 6

## 2014-08-19 LAB — CREATININE, SERUM
Creatinine, Ser: 0.73 mg/dL (ref 0.50–1.10)
GFR calc Af Amer: >90 mL/min (ref >90)
GFR, EST NON AFRICAN AMERICAN: 84 mL/min — AB (ref >90)

## 2014-08-19 LAB — POCT UA - MICROSCOPIC ONLY
BACTERIA, U MICROSCOPIC: NEGATIVE
Casts, Ur, LPF, POC: NEGATIVE
Crystals, Ur, HPF, POC: NEGATIVE
Mucus, UA: NEGATIVE
RBC, URINE, MICROSCOPIC: NEGATIVE
Yeast, UA: NEGATIVE

## 2014-08-19 LAB — BUN: BUN: 12 mg/dL (ref 6–23)

## 2014-08-19 LAB — GLUCOSE, POCT (MANUAL RESULT ENTRY): POC Glucose: 89 mg/dl (ref 70–99)

## 2014-08-19 MED ORDER — CEPHALEXIN 500 MG PO CAPS: 500.0000 mg | ORAL_CAPSULE | Freq: Two times a day (BID) | ORAL | Status: DC

## 2014-08-19 MED ORDER — IOHEXOL 300 MG/ML  SOLN
100.0000 mL | Freq: Once | INTRAMUSCULAR | Status: AC | PRN
  Administered 2014-08-19: 100 mL via INTRAVENOUS

## 2014-08-19 MED ORDER — IOHEXOL 300 MG/ML  SOLN
50.0000 mL | Freq: Once | INTRAMUSCULAR | Status: AC | PRN
  Administered 2014-08-19: 50 mL via ORAL

## 2014-08-19 NOTE — Telephone Encounter (Signed)
Patient states that after CT Scan, they were unable to find anything. She is requesting an antibiotic to help with her UTI  [phone]

## 2014-08-19 NOTE — Telephone Encounter (Signed)
Pt called back and wanted to add that she received a message fromDr. Copland wanting to know who her gastro Dr. Was; Dr. Renford Dills at [facility]

## 2014-08-19 NOTE — Patient Instructions (Addendum)
Go to [facility] register at radiology for CT Abd/pelvis    We will send you for a Ct scan today to check on your intestines, gallbladder, kidneys and appendix.  I will give you a call later on today with your results.  Assuming there is nothing else more serious going on I will plan to treat you for a possible UTI with an antibiotic.

## 2014-08-19 NOTE — Progress Notes (Addendum)
[facility] [address] [place] [address] [phone]  Date:  08/19/2014   Name:  Kristine [name]   DOB:  [birth year]   MRN:  [id number]  PCP:  Jenny Reichmann, MD    Chief Complaint: Abdominal Pain   History of Present Illness:  Kristine [name] is a 73 y.o. very pleasant female patient who presents with the following:  Today is Friday.  On Tuesday she noted frequent BM- not loose but just frequent.  Wednesday this got worse and her stools were softer, and she started to notice stomach cramping and pain.  Yesterday she "jsut felt lousy," ate only oatmeal and toast.  She continues to notice cramping and abdominal discomfort. No stools today.  She is drinking liquids ok but is "afriad to eat."  She has not seen any blood in her stool, no fever.  She is not sure if this might be diverticulitis.  She does have some tenderness in her abdomen.    She is going to have an endoscopy next week for Barrett's esophagus  She last had diverticulitis at least a a year ago.   No vomiting.  No urinary sx, no sick contacts.    Patient Active Problem List   Diagnosis Date Noted  . CAD (coronary artery disease), native coronary artery 06/27/2013  . Hyperlipidemia 06/27/2013  . Precordial pain 06/26/2013  . ST elevation myocardial infarction (STEMI) of anterolateral wall, initial episode of care 06/24/2013  . Hypertension   . Diverticulitis   . GERD (gastroesophageal reflux disease)   . Barrett's esophagus   . Cystocele   . Rectocele     Past Medical History  Diagnosis Date  . GERD (gastroesophageal reflux disease)   . Barrett's esophagus     Stage B  . Cystocele   . Rectocele     mild  . Hypertension   . Diverticulitis   . Arthritis   . Raynaud disease   . CREST variant of scleroderma   . Osteopenia 09/2012    T score -2.4 FRAX 13%/2.7%  per 10/15/2012 discussion plan repeat in 2 years  . Anemia   . STEMI (ST elevation myocardial infarction) 06/21/2013  . CAD  (coronary artery disease)   . Hyperlipidemia     Past Surgical History  Procedure Laterality Date  . Tonsillectomy  1950  . Tubal ligation  1980  . Nissen fundoplication  1914  . Total knee arthroplasty      right  . Joint replacement  9/10    rt total  . Foot surgery  2013    x2  . Abdominal hysterectomy  1996    RSO for menorrhagia.  . Coronary angioplasty with stent placement  06/21/2013    History  Substance Use Topics  . Smoking status: Never Smoker   . Smokeless tobacco: Never Used  . Alcohol Use: Yes     Comment: Occasional    Family History  Problem Relation Age of Onset  . Cancer Mother     Pancreatic Cancer  . Hepatitis C Mother   . Heart disease Father     congestive heart failure  . Panic disorder Daughter   . Asthma Maternal Grandmother   . Emphysema Maternal Grandmother   . Cancer Maternal Grandfather     lung    Allergies  Allergen Reactions  . Aspirin Other (See Comments)    Pt is prone to bleeding, ok with low dose coated aspirin  . Nsaids Other (See Comments)  Pt is prone to bleeding  . Statins Other (See Comments)    elevated LFT'S, tolerates 1/2 dose of zetia and 1/2 dose of pravastatin    Medication list has been reviewed and updated.  Current Outpatient Prescriptions on File Prior to Visit  Medication Sig Dispense Refill  . doxycycline (VIBRAMYCIN) 100 MG capsule Prn dental      . ezetimibe (ZETIA) 10 MG tablet Take 5 mg by mouth daily.      Marland Kitchen lisinopril (PRINIVIL,ZESTRIL) 5 MG tablet Take 1 tablet (5 mg total) by mouth daily.  90 tablet  1  . Multiple Vitamin (MULTIVITAMIN WITH MINERALS) TABS Take 1 tablet by mouth daily.       . nitroGLYCERIN (NITROSTAT) 0.4 MG SL tablet Place 1 tablet (0.4 mg total) under the tongue every 5 (five) minutes x 3 doses as needed for chest pain.  25 tablet  12  . NON FORMULARY FINITI ( ANTI-AGING) EVERY OTHER DAY      . omeprazole (PRILOSEC) 20 MG capsule Take 20 mg by mouth 2 (two) times daily.         . pravastatin (PRAVACHOL) 10 MG tablet Take 5 mg by mouth.       Marland Kitchen aspirin EC 81 MG EC tablet Take 1 tablet (81 mg total) by mouth daily.       No current facility-administered medications on file prior to visit.    Review of Systems:  As per HPI- otherwise negative.   Physical Examination: Filed Vitals:   08/19/14 1027  BP: 108/68  Pulse: 76  Temp: 98 F (36.7 C)  Resp: 17   Filed Vitals:   08/19/14 1027  Height: 5\' 1"  (1.549 m)  Weight: 127 lb (57.607 kg)   Body mass index is 24.01 kg/(m^2). Ideal Body Weight: Weight in (lb) to have BMI = 25: 132  GEN: WDWN, NAD, Non-toxic, A & O x 3,looks well HEENT: Atraumatic, Normocephalic. Neck supple. No masses, No LAD.  Bilateral TM wnl, oropharynx normal.  PEERL,EOMI.   Ears and Nose: No external deformity. CV: RRR, No M/G/R. No JVD. No thrill. No extra heart sounds. PULM: CTA B, no wheezes, crackles, rhonchi. No retractions. No resp. distress. No accessory muscle use. ABD: S, ND, +BS. No rebound. No HSM.  Non -specific tenderness more over the RUQ and RLQ of her abdoemn EXTR: No c/c/e NEURO Normal gait.  PSYCH: Normally interactive. Conversant. Not depressed or anxious appearing.  Calm demeanor.   [facility] reading (PRIMARY) by  Dr. Lorelei Pont. abd series: significant scoliosis and degenerative change of the lumbar spine.  No evidence of obstruction or perforation/ free air.  Lungs are clear.   ACUTE ABDOMEN SERIES (ABDOMEN 2 VIEW & CHEST 1 VIEW)  COMPARISON: CT abdomen and pelvis September 22, 2012 ; Chest radiograph November 15, 2013  FINDINGS: PA chest: There is no edema or consolidation. The heart size and pulmonary vascularity are normal. No adenopathy. There is atherosclerotic change in aorta. There is thoracolumbar dextroscoliosis.  Supine and upright abdomen: There is diffuse stool throughout colon. Bowel gas pattern is unremarkable. No obstruction or free air. Several undissolved tablets are noted throughout the  colon. There is lumbar scoliosis and degenerative type change.  IMPRESSION: Diffuse stool in colon. Several undissolved tablets are noted throughout the colon. Bowel gas pattern unremarkable. No lung edema or consolidation.   Results for orders placed in visit on 08/19/14  POCT CBC      Result Value Ref Range   WBC 8.2  4.6 -  10.2 K/uL   Lymph, poc 2.0  0.6 - 3.4   POC LYMPH PERCENT 24.5  10 - 50 %L   MID (cbc) 0.7  0 - 0.9   POC MID % 8.7  0 - 12 %M   POC Granulocyte 5.5  2 - 6.9   Granulocyte percent 66.8  37 - 80 %G   RBC 4.77  4.04 - 5.48 M/uL   Hemoglobin 13.6  12.2 - 16.2 g/dL   HCT, POC 41.9  37.7 - 47.9 %   MCV 87.8  80 - 97 fL   MCH, POC 28.4  27 - 31.2 pg   MCHC 32.4  31.8 - 35.4 g/dL   RDW, POC 15.8     Platelet Count, POC 151  142 - 424 K/uL   MPV 7.5  0 - 99.8 fL  GLUCOSE, POCT (MANUAL RESULT ENTRY)      Result Value Ref Range   POC Glucose 89  70 - 99 mg/dl  POCT URINALYSIS DIPSTICK      Result Value Ref Range   Color, UA yellow     Clarity, UA clear     Glucose, UA neg     Bilirubin, UA neg     Ketones, UA neg     Spec Grav, UA <=1.005     Blood, UA neg     pH, UA 6.0     Protein, UA neg     Urobilinogen, UA 0.2     Nitrite, UA neg     Leukocytes, UA moderate (2+)    POCT UA - MICROSCOPIC ONLY      Result Value Ref Range   WBC, Ur, HPF, POC 1-4     RBC, urine, microscopic neg     Bacteria, U Microscopic neg     Mucus, UA neg     Epithelial cells, urine per micros 0-1     Crystals, Ur, HPF, POC neg     Casts, Ur, LPF, POC neg     Yeast, UA neg      Assessment and Plan: Right upper quadrant pain - Plan: POCT CBC, POCT glucose (manual entry), Comprehensive metabolic panel, POCT urinalysis dipstick, POCT UA - Microscopic Only, DG Abd Acute W/Chest  Anorexia  Diarrhea  Right lower quadrant pain - Plan: Urine culture, CT Abdomen Pelvis W Contrast, cephALEXin (KEFLEX) 500 MG capsule  Need for prophylactic vaccination and inoculation against  influenza - Plan: Flu Vaccine QUAD 36+ mos IM  Mayrani is here today with stool changes and abdominal discomfort.  The DDX is somewhat broad and included diverticulitis, UTI, and less likely appendicitis or cholecystitis.  She would like to proceed with CT scan to rule-out any more urgent pathology.  Sent to [facility] for [facility] scan.    Signed Lamar Blinks, MD  Received CT results:   CT ABDOMEN AND PELVIS WITH CONTRAST  TECHNIQUE: Multidetector CT imaging of the abdomen and pelvis was performed using the standard protocol following bolus administration of intravenous contrast. Sagittal and coronal MPR images reconstructed from axial data set.  CONTRAST: 47mL OMNIPAQUE IOHEXOL 300 MG/ML SOLN PO, 144mL OMNIPAQUE IOHEXOL 300 MG/ML SOLN IV  COMPARISON: 09/22/2012  FINDINGS: Lung bases clear.  Moderate-sized hiatal hernia, stomach folded on itself, difficult to exclude gastric wall thickening with this appearance.  Liver, spleen, pancreas, kidneys, and adrenal glands normal appearance.  Mild distal colonic diverticulosis.  Normal appendix. Stomach and bowel loops otherwise normal appearance. Unremarkable bladder and ureters with surgical absence of uterus. Normal  sized LEFT ovary without visualization of the RIGHT ovary. No mass, adenopathy, free fluid or inflammatory process. Scattered atherosclerotic calcifications. Dextro convex thoracolumbar scoliosis with scattered degenerative disc and facet disease changes of the thoracolumbar spine.  IMPRESSION: Moderate-sized hiatal hernia, unable to exclude gastric wall thickening with observed appearance. Distal colonic diverticulosis without evidence of diverticulitis. No definite acute intra-abdominal or intrapelvic abnormalities identified.  Called in keflex to her drug store for possible UTI.  Await urine culture.  Asked her to let me know if not better in the next couple of days  Meds ordered this encounter  Medications  .  cephALEXin (KEFLEX) 500 MG capsule    Sig: Take 1 capsule (500 mg total) by mouth 2 (two) times daily.    Dispense:  14 capsule    Refill:  0   Called to check on her 9/26; she is doing well, feeling much better.  We are still waiting on culture result.  Got name of her GI doctor at [facility], [employer] and I will send her CT report to him prior to her endoscopy next week.   9/28:  Urine culture negative.  Called her to let her know and LMOM. She can continue her abx as she seems to be feeing better with it.  I faxed her CT to Dr. Adria Devon- please make sure that he received it

## 2014-08-20 LAB — URINE CULTURE
Colony Count: NO GROWTH
Organism ID, Bacteria: NO GROWTH

## 2014-08-20 NOTE — Telephone Encounter (Signed)
Spoke with patient and she picked up abx yesterday. See previous message for gastro info

## 2014-08-22 ENCOUNTER — Encounter: Payer: Self-pay | Admitting: Family Medicine

## 2014-08-29 ENCOUNTER — Ambulatory Visit (INDEPENDENT_AMBULATORY_CARE_PROVIDER_SITE_OTHER): Payer: BC Managed Care – PPO

## 2014-08-29 ENCOUNTER — Ambulatory Visit (INDEPENDENT_AMBULATORY_CARE_PROVIDER_SITE_OTHER): Payer: BC Managed Care – PPO | Admitting: Emergency Medicine

## 2014-08-29 VITALS — BP 126/78 | HR 98 | Temp 98.0°F | Resp 17 | Ht 61.5 in | Wt 125.0 lb

## 2014-08-29 DIAGNOSIS — R059 Cough, unspecified: Secondary | ICD-10-CM

## 2014-08-29 DIAGNOSIS — R05 Cough: Secondary | ICD-10-CM

## 2014-08-29 DIAGNOSIS — J069 Acute upper respiratory infection, unspecified: Secondary | ICD-10-CM

## 2014-08-29 DIAGNOSIS — R0781 Pleurodynia: Secondary | ICD-10-CM

## 2014-08-29 LAB — POCT RAPID STREP A (OFFICE): RAPID STREP A SCREEN: NEGATIVE

## 2014-08-29 MED ORDER — BENZONATATE 100 MG PO CAPS: 100.0000 mg | ORAL_CAPSULE | Freq: Three times a day (TID) | ORAL | Status: DC | PRN

## 2014-08-29 NOTE — Progress Notes (Signed)
   Subjective:    Patient ID: Kristine [name], female    DOB: 1941/02/24, 73 y.o.   MRN: [id number]  This chart was scribed for [name], MD by Marti Sleigh, Medical Scribe. This patient was seen in Room 11 and the patient's care was started at 8:29 AM.    Sore Throat  This is a new problem. The current episode started in the past 7 days. The problem has been gradually worsening. The pain is worse on the left side. There has been no fever. The pain is mild. Associated symptoms include congestion and coughing. Pertinent negatives include no diarrhea, ear discharge, headaches or vomiting.   HPI Comments: Kristine [name] is a 73 y.o. female who presents to the Emergency Department complaining of a dry, hacking cough that started three days ago with mild white purulent drainage. Pt endorses rhinorrhea, left sided chest pain, sinus congestion as associated symptoms. Pt denies fever, GI symptoms. Pt had recent esophogeal ablation.    Review of Systems  Constitutional: Negative for fever and appetite change.       Per HPI, otherwise negative  HENT: Positive for congestion and rhinorrhea. Negative for ear discharge.   Eyes: Negative for discharge.  Respiratory: Positive for cough.   Cardiovascular: Negative for chest pain.  Gastrointestinal: Negative for vomiting and diarrhea.  Genitourinary: Negative.  Negative for frequency and hematuria.  Musculoskeletal: Negative for back pain.  Skin: Negative for rash.  Neurological: Negative for seizures, syncope and headaches.  All other systems reviewed and are negative.      Objective:   Physical Exam  Nursing note and vitals reviewed. Constitutional: She is oriented to person, place, and time. She appears well-developed and well-nourished.  HENT:  Head: Normocephalic and atraumatic.  Mouth/Throat: Posterior oropharyngeal erythema present.  Eyes: Conjunctivae and EOM are normal. Pupils are equal, round, and reactive to light.  Neck: Normal range of  motion. Neck supple.  Cardiovascular: Normal rate, regular rhythm and normal heart sounds.   Pulmonary/Chest: Effort normal and breath sounds normal.  Abdominal: Soft. Bowel sounds are normal.  Musculoskeletal: Normal range of motion.  Neurological: She is alert and oriented to person, place, and time.  Skin: Skin is warm and dry.  Psychiatric: She has a normal mood and affect. Her behavior is normal.  [facility] reading (PRIMARY) by  Dr Everlene Farrier I did not identify any rib fractures Results for orders placed in visit on 08/29/14  POCT RAPID STREP A (OFFICE)      Result Value Ref Range   Rapid Strep A Screen Negative  Negative   Meds ordered this encounter  Medications  . benzonatate (TESSALON) 100 MG capsule    Sig: Take 1-2 capsules (100-200 mg total) by mouth 3 (three) times daily as needed for cough.    Dispense:  40 capsule    Refill:  0      Assessment & Plan:  We'll treat with Tessalon Perles saltwater gargles rest and fluids she will be given a note for I personally performed the services described in this documentation, which was scribed in my presence. The recorded information has been reviewed and is accurate.

## 2014-08-29 NOTE — Patient Instructions (Signed)
Cough, Adult  A cough is a reflex that helps clear your throat and airways. It can help heal the body or may be a reaction to an irritated airway. A cough may only last 2 or 3 weeks (acute) or may last more than 8 weeks (chronic).  CAUSES Acute cough:  Viral or bacterial infections. Chronic cough:  Infections.  Allergies.  Asthma.  Post-nasal drip.  Smoking.  Heartburn or acid reflux.  Some medicines.  Chronic lung problems (COPD).  Cancer. SYMPTOMS   Cough.  Fever.  Chest pain.  Increased breathing rate.  High-pitched whistling sound when breathing (wheezing).  Colored mucus that you cough up (sputum). TREATMENT   A bacterial cough may be treated with antibiotic medicine.  A viral cough must run its course and will not respond to antibiotics.  Your caregiver may recommend other treatments if you have a chronic cough. HOME CARE INSTRUCTIONS   Only take over-the-counter or prescription medicines for pain, discomfort, or fever as directed by your caregiver. Use cough suppressants only as directed by your caregiver.  Use a cold steam vaporizer or humidifier in your bedroom or home to help loosen secretions.  Sleep in a semi-upright position if your cough is worse at night.  Rest as needed.  Stop smoking if you smoke. SEEK IMMEDIATE MEDICAL CARE IF:   You have pus in your sputum.  Your cough starts to worsen.  You cannot control your cough with suppressants and are losing sleep.  You begin coughing up blood.  You have difficulty breathing.  You develop pain which is getting worse or is uncontrolled with medicine.  You have a fever. MAKE SURE YOU:   Understand these instructions.  Will watch your condition.  Will get help right away if you are not doing well or get worse. Document Released: 05/10/2011 Document Revised: 02/03/2012 Document Reviewed: 05/10/2011 Exit[employer] Patient Information 2015 [employer]. This information is not intended  to replace advice given to you by your health care provider. Make sure you discuss any questions you have with your health care provider.  

## 2014-09-14 ENCOUNTER — Encounter: Payer: Self-pay | Admitting: Emergency Medicine

## 2014-09-14 ENCOUNTER — Telehealth: Payer: Self-pay | Admitting: Emergency Medicine

## 2014-09-14 NOTE — Telephone Encounter (Signed)
Patient dropped off FMLA forms on 09/12/2014 and they were placed in Dr. Perfecto Kingdom box on 09/14/2014. Patient states that she needs FMLA to cover her for time out of work. Was written out of work from 08/29/2014-09/04/2014. Please return to disabilities/FMLA when completed.

## 2014-09-20 ENCOUNTER — Encounter: Payer: Self-pay | Admitting: Emergency Medicine

## 2014-09-20 DIAGNOSIS — Z0271 Encounter for disability determination: Secondary | ICD-10-CM

## 2014-09-26 ENCOUNTER — Telehealth: Payer: Self-pay

## 2014-09-26 MED ORDER — EZETIMIBE 10 MG PO TABS: 5.0000 mg | ORAL_TABLET | Freq: Every day | ORAL | Status: DC

## 2014-09-26 NOTE — Telephone Encounter (Signed)
Left message on machine for pt to contact the office.   

## 2014-09-27 ENCOUNTER — Other Ambulatory Visit: Payer: Self-pay

## 2014-09-27 MED ORDER — PRAVASTATIN SODIUM 10 MG PO TABS
10.0000 mg | ORAL_TABLET | Freq: Every evening | ORAL | Status: DC
Start: 2014-09-27 — End: 2015-10-12

## 2014-09-27 NOTE — Telephone Encounter (Signed)
I spoke with the pt and she is currently taking pravastatin 10mg  once a day. The pt was cutting this pill in half but it crumbled. She discussed this at her office visit with Dr Burt Knack and he recommended that she try taking a whole tablet daily to see if she could tolerate this dosage. The pt has tolerated this dosage and I will send in a new Rx for pravastatin.

## 2014-09-30 ENCOUNTER — Encounter: Payer: Self-pay | Admitting: Gynecology

## 2014-11-09 ENCOUNTER — Encounter: Payer: Self-pay | Admitting: Emergency Medicine

## 2014-12-29 ENCOUNTER — Ambulatory Visit (INDEPENDENT_AMBULATORY_CARE_PROVIDER_SITE_OTHER): Payer: BLUE CROSS/BLUE SHIELD

## 2014-12-29 ENCOUNTER — Ambulatory Visit (INDEPENDENT_AMBULATORY_CARE_PROVIDER_SITE_OTHER): Payer: BLUE CROSS/BLUE SHIELD | Admitting: Family Medicine

## 2014-12-29 VITALS — BP 140/68 | HR 83 | Temp 98.6°F | Resp 16 | Ht 61.0 in | Wt 127.2 lb

## 2014-12-29 DIAGNOSIS — R197 Diarrhea, unspecified: Secondary | ICD-10-CM

## 2014-12-29 DIAGNOSIS — R1032 Left lower quadrant pain: Secondary | ICD-10-CM

## 2014-12-29 DIAGNOSIS — R140 Abdominal distension (gaseous): Secondary | ICD-10-CM

## 2014-12-29 LAB — POCT CBC
GRANULOCYTE PERCENT: 59.7 % (ref 37–80)
HCT, POC: 42.4 % (ref 37.7–47.9)
Hemoglobin: 13.1 g/dL (ref 12.2–16.2)
LYMPH, POC: 2.8 (ref 0.6–3.4)
MCH, POC: 27.7 pg (ref 27–31.2)
MCHC: 30.9 g/dL — AB (ref 31.8–35.4)
MCV: 89.9 fL (ref 80–97)
MID (CBC): 0.3 (ref 0–0.9)
MPV: 7.4 fL (ref 0–99.8)
POC GRANULOCYTE: 4.6 (ref 2–6.9)
POC LYMPH %: 36.6 % (ref 10–50)
POC MID %: 3.7 %M (ref 0–12)
Platelet Count, POC: 179 10*3/uL (ref 142–424)
RBC: 4.72 M/uL (ref 4.04–5.48)
RDW, POC: 17.6 %
WBC: 7.7 10*3/uL (ref 4.6–10.2)

## 2014-12-29 LAB — POCT URINALYSIS DIPSTICK
Bilirubin, UA: NEGATIVE
Glucose, UA: NEGATIVE
Ketones, UA: NEGATIVE
Nitrite, UA: NEGATIVE
PH UA: 6
Protein, UA: NEGATIVE
SPEC GRAV UA: 1.01
Urobilinogen, UA: 0.2

## 2014-12-29 LAB — POCT UA - MICROSCOPIC ONLY
Casts, Ur, LPF, POC: NEGATIVE
Crystals, Ur, HPF, POC: NEGATIVE
Mucus, UA: NEGATIVE
Yeast, UA: NEGATIVE

## 2014-12-29 NOTE — Progress Notes (Signed)
Subjective:    Patient ID: Kristine [name], female    DOB: [birth year], 74 y.o.   MRN: [id number]  12/29/2014  Diarrhea and Abdominal Pain   HPI This 74 y.o. female presents for evaluation of abdominal pain, diarrhea.  Onse week ago, developd bloating.  Developed diarrhea yesterday and had to leave work.  Went eight times before lunch. Started feeling drained; went home and took Imodium.  Bought [place] [employer] with butter small amount, tea.  Diarrhea stopped. Today, has a catch in L abdomen and radiates to upper L back.  No sharp pain; just an ache.  Unable to give urine sample.  Drank 2 bottles of water.  No fever but +chills for two days.  No n/v.  Still bloated.  Has suffered with GERD for thirty years; has developed into Barrett's.  Known HH.  Goes to [facility]. Felt pre-cancerous cells growing on esophagus in 05/2014.  Suggested having radiofrequency ablation to burn out pre-cancerous cells.  S/p ablation.  Performed x 2; last ablation in 09/2014.  Sent out for biopsies that returned no dysplasia.  Then ten days ago, went back expecting another ablation but was not warranted because suffering with esophageal erosions and inflammation; had to stop treatments. Took biopsy; those cells returned no dysplasia.  Follow-up in six months.  Pt produces excessive acid.  Prescribed 40mg  Omeprazole bid for two months.  No urinary symptoms; denies dysuria, hematuria, frequency, urgency, nocturia.  May warrant Nissen but has had Nissen 15 years ago.  No diarrhea today.  Only nagging pain; constant pain on R side.Severity 5/10.  No flatus today; usually gas.  Chronic bloating on and off with worsening in past week since last EGD; concerned about Omeprazole 40mg  bid.  History of recurrent diverticulitis; this feels a little different.  S/p CT in 07/2014; +diverticulosis.  Did eat nuts two days ago.     Review of Systems  Constitutional: Positive for chills. Negative for diaphoresis and fatigue.  Respiratory: Negative for  shortness of breath.   Cardiovascular: Negative for chest pain.  Gastrointestinal: Positive for abdominal pain, diarrhea and abdominal distention. Negative for nausea, vomiting, constipation, blood in stool, anal bleeding and rectal pain.  Genitourinary: Positive for flank pain. Negative for dysuria, urgency, frequency, hematuria and pelvic pain.  Skin: Negative for rash.    Past Medical History  Diagnosis Date  . GERD (gastroesophageal reflux disease)   . Barrett's esophagus     Stage B  . Cystocele   . Rectocele     mild  . Hypertension   . Diverticulitis   . Arthritis   . Raynaud disease   . CREST variant of scleroderma   . Osteopenia 09/2012    T score -2.4 FRAX 13%/2.7%  per 10/15/2012 discussion plan repeat in 2 years  . Anemia   . STEMI (ST elevation myocardial infarction) 06/21/2013  . CAD (coronary artery disease)   . Hyperlipidemia    Past Surgical History  Procedure Laterality Date  . Tonsillectomy  1950  . Tubal ligation  1980  . Nissen fundoplication  4259  . Total knee arthroplasty      right  . Joint replacement  9/10    rt total  . Foot surgery  2013    x2  . Abdominal hysterectomy  1996    RSO for menorrhagia.  . Coronary angioplasty with stent placement  06/21/2013   Allergies  Allergen Reactions  . Aspirin Other (See Comments)    Pt is prone to bleeding,  ok with low dose coated aspirin  . Nsaids Other (See Comments)    Pt is prone to bleeding  . Statins Other (See Comments)    elevated LFT'S, tolerates 1/2 dose of zetia and 1/2 dose of pravastatin   Current Outpatient Prescriptions  Medication Sig Dispense Refill  . aspirin EC 81 MG EC tablet Take 1 tablet (81 mg total) by mouth daily.    . benzonatate (TESSALON) 100 MG capsule Take 1-2 capsules (100-200 mg total) by mouth 3 (three) times daily as needed for cough. 40 capsule 0  . cephALEXin (KEFLEX) 500 MG capsule Take 1 capsule (500 mg total) by mouth 2 (two) times daily. 14 capsule 0  .  doxycycline (VIBRAMYCIN) 100 MG capsule Prn dental    . ezetimibe (ZETIA) 10 MG tablet Take 0.5 tablets (5 mg total) by mouth daily. 90 tablet 2  . lisinopril (PRINIVIL,ZESTRIL) 5 MG tablet Take 1 tablet (5 mg total) by mouth daily. 90 tablet 1  . Multiple Vitamin (MULTIVITAMIN WITH MINERALS) TABS Take 1 tablet by mouth daily.     . nitroGLYCERIN (NITROSTAT) 0.4 MG SL tablet Place 1 tablet (0.4 mg total) under the tongue every 5 (five) minutes x 3 doses as needed for chest pain. 25 tablet 12  . NON FORMULARY FINITI ( ANTI-AGING) EVERY OTHER DAY    . omeprazole (PRILOSEC) 20 MG capsule Take 40 mg by mouth 2 (two) times daily.     . pravastatin (PRAVACHOL) 10 MG tablet Take 1 tablet (10 mg total) by mouth every evening. 90 tablet 3   No current facility-administered medications for this visit.       Objective:    BP 140/68 mmHg  Pulse 83  Temp(Src) 98.6 F (37 C) (Oral)  Resp 16  Ht 5\' 1"  (1.549 m)  Wt 127 lb 3.2 oz (57.698 kg)  BMI 24.05 kg/m2  SpO2 99%  LMP 11/25/1994 Physical Exam  Constitutional: She is oriented to person, place, and time. She appears well-developed and well-nourished. No distress.  HENT:  Head: Normocephalic and atraumatic.  Right Ear: External ear normal.  Left Ear: External ear normal.  Nose: Nose normal.  Mouth/Throat: Oropharynx is clear and moist.  Eyes: Conjunctivae and EOM are normal. Pupils are equal, round, and reactive to light.  Neck: Normal range of motion. Neck supple. Carotid bruit is not present. No thyromegaly present.  Cardiovascular: Normal rate, regular rhythm, normal heart sounds and intact distal pulses.  Exam reveals no gallop and no friction rub.   No murmur heard. Pulmonary/Chest: Effort normal and breath sounds normal. She has no wheezes. She has no rales.  Abdominal: Soft. Bowel sounds are normal. She exhibits no distension and no mass. There is no tenderness. There is no rebound and no guarding.  Musculoskeletal:       Lumbar  back: Normal. She exhibits normal range of motion, no tenderness, no bony tenderness, no pain, no spasm and normal pulse.  Lymphadenopathy:    She has no cervical adenopathy.  Neurological: She is alert and oriented to person, place, and time. No cranial nerve deficit.  Skin: Skin is warm and dry. No rash noted. She is not diaphoretic. No erythema. No pallor.  Psychiatric: She has a normal mood and affect. Her behavior is normal.   Results for orders placed or performed in visit on 12/29/14  POCT CBC  Result Value Ref Range   WBC 7.7 4.6 - 10.2 K/uL   Lymph, poc 2.8 0.6 - 3.4   POC  LYMPH PERCENT 36.6 10 - 50 %L   MID (cbc) 0.3 0 - 0.9   POC MID % 3.7 0 - 12 %M   POC Granulocyte 4.6 2 - 6.9   Granulocyte percent 59.7 37 - 80 %G   RBC 4.72 4.04 - 5.48 M/uL   Hemoglobin 13.1 12.2 - 16.2 g/dL   HCT, POC 42.4 37.7 - 47.9 %   MCV 89.9 80 - 97 fL   MCH, POC 27.7 27 - 31.2 pg   MCHC 30.9 (A) 31.8 - 35.4 g/dL   RDW, POC 17.6 %   Platelet Count, POC 179 142 - 424 K/uL   MPV 7.4 0 - 99.8 fL   [facility] reading (PRIMARY) by  Dr. Tamala Julian.  AAS:  No free air; no obstruction.      Assessment & Plan:   1. Diarrhea   2. Bloating   3. Abdominal pain, left lower quadrant    1.  L abdominal pain with radiation into L flank area:  New.  Onset after diarrheal illness; benign abdominal exam, CVA exam, lumbar spine exam.  Normal WBC count.  Obtain CMET, Lipase, Amylase. Recommend BRAT diet, hydration, frequent ambulation. RTC for acute worsening. 2.  Bloating: worsening; onset several months ago with recent worsening after EGD.  Recommend BRAT diet, hydration, frequent ambulation. No free air or obstruction on AAS. 3.  Diarrhea:  New and now resolved; no recurrent diarrhea in 24 hours.      No orders of the defined types were placed in this encounter.    No Follow-up on file.    Kristine [name] Kristine [name], M.D. [facility] [address] [place], [place]  [id number] [phone] phone [phone] fax

## 2014-12-29 NOTE — Patient Instructions (Signed)
1. Take Tylenol for pain.    Bloating Bloating is the feeling of fullness in your belly. You may feel as though your pants are too tight. Often the cause of bloating is overeating, retaining fluids, or having gas in your bowel. It is also caused by swallowing air and eating foods that cause gas. Irritable bowel syndrome is one of the most common causes of bloating. Constipation is also a common cause. Sometimes more serious problems can cause bloating. SYMPTOMS  Usually there is a feeling of fullness, as though your abdomen is bulged out. There may be mild discomfort.  DIAGNOSIS  Usually no particular testing is necessary for most bloating. If the condition persists and seems to become worse, your caregiver may do additional testing.  TREATMENT   There is no direct treatment for bloating.  Do not put gas into the bowel. Avoid chewing gum and sucking on candy. These tend to make you swallow air. Swallowing air can also be a nervous habit. Try to avoid this.  Avoiding high residue diets will help. Eat foods with soluble fibers (examples include root vegetables, apples, or barley) and substitute dairy products with soy and rice products. This helps irritable bowel syndrome.  If constipation is the cause, then a high residue diet with more fiber will help.  Avoid carbonated beverages.  Over-the-counter preparations are available that help reduce gas. Your pharmacist can help you with this. SEEK MEDICAL CARE IF:   Bloating continues and seems to be getting worse.  You notice a weight gain.  You have a weight loss but the bloating is getting worse.  You have changes in your bowel habits or develop nausea or vomiting. SEEK IMMEDIATE MEDICAL CARE IF:   You develop shortness of breath or swelling in your legs.  You have an increase in abdominal pain or develop chest pain. Document Released: 09/11/2006 Document Revised: 02/03/2012 Document Reviewed: 10/30/2007 [facility] Patient Information  2015 [place], [place]. This information is not intended to replace advice given to you by your health care provider. Make sure you discuss any questions you have with your health care provider.

## 2014-12-30 LAB — COMPREHENSIVE METABOLIC PANEL
ALBUMIN: 4.1 g/dL (ref 3.5–5.2)
ALK PHOS: 85 U/L (ref 39–117)
ALT: 18 U/L (ref 0–35)
AST: 16 U/L (ref 0–37)
BUN: 11 mg/dL (ref 6–23)
CALCIUM: 9.2 mg/dL (ref 8.4–10.5)
CO2: 28 meq/L (ref 19–32)
Chloride: 105 mEq/L (ref 96–112)
Creat: 0.78 mg/dL (ref 0.50–1.10)
Glucose, Bld: 97 mg/dL (ref 70–99)
Potassium: 4.4 mEq/L (ref 3.5–5.3)
Sodium: 141 mEq/L (ref 135–145)
TOTAL PROTEIN: 6.3 g/dL (ref 6.0–8.3)
Total Bilirubin: 0.3 mg/dL (ref 0.2–1.2)

## 2014-12-30 LAB — AMYLASE: Amylase: 23 U/L (ref 0–105)

## 2014-12-30 LAB — LIPASE: Lipase: 23 U/L (ref 0–75)

## 2014-12-31 LAB — URINE CULTURE

## 2015-01-13 ENCOUNTER — Encounter (HOSPITAL_COMMUNITY): Payer: Self-pay | Admitting: *Deleted

## 2015-01-13 ENCOUNTER — Ambulatory Visit (INDEPENDENT_AMBULATORY_CARE_PROVIDER_SITE_OTHER): Payer: BLUE CROSS/BLUE SHIELD | Admitting: Emergency Medicine

## 2015-01-13 ENCOUNTER — Emergency Department (HOSPITAL_COMMUNITY)
Admission: EM | Admit: 2015-01-13 | Discharge: 2015-01-13 | Disposition: A | Discharge status: Home / Self Care | Payer: BLUE CROSS/BLUE SHIELD | Attending: Emergency Medicine | Admitting: Emergency Medicine

## 2015-01-13 VITALS — BP 132/78 | HR 88 | Temp 97.8°F | Resp 18

## 2015-01-13 DIAGNOSIS — I252 Old myocardial infarction: Secondary | ICD-10-CM | POA: Insufficient documentation

## 2015-01-13 DIAGNOSIS — Z8742 Personal history of other diseases of the female genital tract: Secondary | ICD-10-CM | POA: Insufficient documentation

## 2015-01-13 DIAGNOSIS — M79621 Pain in right upper arm: Secondary | ICD-10-CM | POA: Diagnosis not present

## 2015-01-13 DIAGNOSIS — M79601 Pain in right arm: Secondary | ICD-10-CM

## 2015-01-13 DIAGNOSIS — I2510 Atherosclerotic heart disease of native coronary artery without angina pectoris: Secondary | ICD-10-CM | POA: Insufficient documentation

## 2015-01-13 DIAGNOSIS — Z7982 Long term (current) use of aspirin: Secondary | ICD-10-CM | POA: Diagnosis not present

## 2015-01-13 DIAGNOSIS — D649 Anemia, unspecified: Secondary | ICD-10-CM | POA: Insufficient documentation

## 2015-01-13 DIAGNOSIS — Z79899 Other long term (current) drug therapy: Secondary | ICD-10-CM | POA: Insufficient documentation

## 2015-01-13 DIAGNOSIS — Z792 Long term (current) use of antibiotics: Secondary | ICD-10-CM | POA: Insufficient documentation

## 2015-01-13 DIAGNOSIS — K219 Gastro-esophageal reflux disease without esophagitis: Secondary | ICD-10-CM | POA: Insufficient documentation

## 2015-01-13 DIAGNOSIS — E785 Hyperlipidemia, unspecified: Secondary | ICD-10-CM | POA: Insufficient documentation

## 2015-01-13 DIAGNOSIS — I10 Essential (primary) hypertension: Secondary | ICD-10-CM | POA: Diagnosis not present

## 2015-01-13 DIAGNOSIS — M1990 Unspecified osteoarthritis, unspecified site: Secondary | ICD-10-CM | POA: Diagnosis not present

## 2015-01-13 LAB — BASIC METABOLIC PANEL
Anion gap: 7 (ref 5–15)
BUN: 9 mg/dL (ref 6–23)
CO2: 28 mmol/L (ref 19–32)
Calcium: 10 mg/dL (ref 8.4–10.5)
Chloride: 105 mmol/L (ref 96–112)
Creatinine, Ser: 0.82 mg/dL (ref 0.50–1.10)
GFR calc Af Amer: 81 mL/min — ABNORMAL LOW (ref >90)
GFR calc non Af Amer: 70 mL/min — ABNORMAL LOW (ref >90)
Glucose, Bld: 104 mg/dL — ABNORMAL HIGH (ref 70–99)
Potassium: 4.3 mmol/L (ref 3.5–5.1)
Sodium: 140 mmol/L (ref 135–145)

## 2015-01-13 LAB — CBC
HCT: 37.7 % (ref 36.0–46.0)
Hemoglobin: 12.3 g/dL (ref 12.0–15.0)
MCH: 28.5 pg (ref 26.0–34.0)
MCHC: 32.6 g/dL (ref 30.0–36.0)
MCV: 87.3 fL (ref 78.0–100.0)
Platelets: 144 10*3/uL — ABNORMAL LOW (ref 150–400)
RBC: 4.32 MIL/uL (ref 3.87–5.11)
RDW: 14.1 % (ref 11.5–15.5)
WBC: 7.1 10*3/uL (ref 4.0–10.5)

## 2015-01-13 LAB — I-STAT TROPONIN, ED: Troponin i, poc: 0.01 ng/mL (ref 0.00–0.08)

## 2015-01-13 NOTE — ED Notes (Signed)
Pt is in stable condition upon d/c and ambulates from ED. 

## 2015-01-13 NOTE — ED Notes (Signed)
Pt in via EMS from urgent care, reports this morning she had an episode of right arm pain, resolved after two tylenol, went to work and while at work pain returned, pt was driving self to urgent care and had an episode of chest pain, took nitro SL x1 and the chest pain resolved, continues to have intermittent right arm pain, denies pain on arrival, no distress noted. IV started PTA, given 324 ASA.

## 2015-01-13 NOTE — Progress Notes (Addendum)
This chart was scribed for Kristine Jordan, MD by Einar Pheasant, ED Scribe. This patient was seen in room 6 and the patient's care was started at 1:07 PM.  Subjective:    Patient ID: [name], female    DOB: 1941/07/28, 74 y.o.   MRN: [id number]  Chief Complaint  Patient presents with  . Arm Pain    started this morning on right arm    HPI Kristine [name] is a 74 y.o. female with PMhx of MI presents to the office complaining of sudden onset persistent right arm pain that started this morning at 1:30. Pt states that the pain starts at her elbow and radiates up her right arm and stops at her shoulder. She states that with her h/o of MI she was concerned about her symptoms, so she is here for an evaluation. Pt states that she took 2 tylenols and went back to sleep. After waking up at 7 AM she went to work, However, while at work she started to experience intermittent burst of pain going through her right arm. She states that one her way to the office she started to experience a tightening sensation to her right upper arm, for which she reports taking 1 Nitro given her symptoms. She does not endorse pain, however the tightness is still present. Pt states that "she would rather be safe than sorry." Denies any chest pain.   Pt denies fever, neck pain, sore throat, visual disturbance, CP, cough, SOB, abdominal pain, nausea, emesis, diarrhea, urinary symptoms, back pain, HA, weakness, numbness and rash as associated symptoms.     Patient Active Problem List   Diagnosis Date Noted  . CAD (coronary artery disease), native coronary artery 06/27/2013  . Hyperlipidemia 06/27/2013  . Precordial pain 06/26/2013  . ST elevation myocardial infarction (STEMI) of anterolateral wall, initial episode of care 06/24/2013  . Hypertension   . Diverticulitis   . GERD (gastroesophageal reflux disease)   . Barrett's esophagus   . Cystocele   . Rectocele    Past Medical History  Diagnosis Date  . GERD  (gastroesophageal reflux disease)   . Barrett's esophagus     Stage B  . Cystocele   . Rectocele     mild  . Hypertension   . Diverticulitis   . Arthritis   . Raynaud disease   . CREST variant of scleroderma   . Osteopenia 09/2012    T score -2.4 FRAX 13%/2.7%  per 10/15/2012 discussion plan repeat in 2 years  . Anemia   . STEMI (ST elevation myocardial infarction) 06/21/2013  . CAD (coronary artery disease)   . Hyperlipidemia    Past Surgical History  Procedure Laterality Date  . Tonsillectomy  1950  . Tubal ligation  1980  . Nissen fundoplication  6195  . Total knee arthroplasty      right  . Joint replacement  9/10    rt total  . Foot surgery  2013    x2  . Abdominal hysterectomy  1996    RSO for menorrhagia.  . Coronary angioplasty with stent placement  06/21/2013   Allergies  Allergen Reactions  . Aspirin Other (See Comments)    Pt is prone to bleeding, ok with low dose coated aspirin  . Nsaids Other (See Comments)    Pt is prone to bleeding  . Statins Other (See Comments)    elevated LFT'S, tolerates 1/2 dose of zetia and 1/2 dose of pravastatin   Prior to Admission medications  Medication Sig Start Date End Date Taking? Authorizing Provider  aspirin EC 81 MG EC tablet Take 1 tablet (81 mg total) by mouth daily. 06/24/13   Rhonda G Barrett, PA-C  benzonatate (TESSALON) 100 MG capsule Take 1-2 capsules (100-200 mg total) by mouth 3 (three) times daily as needed for cough. 08/29/14   Darlyne Russian, MD  cephALEXin (KEFLEX) 500 MG capsule Take 1 capsule (500 mg total) by mouth 2 (two) times daily. 08/19/14   Darreld Mclean, MD  doxycycline (VIBRAMYCIN) 100 MG capsule Prn dental 03/08/14   Karen Chafe Molpus, MD  ezetimibe (ZETIA) 10 MG tablet Take 0.5 tablets (5 mg total) by mouth daily. 09/26/14   Blane Ohara, MD  lisinopril (PRINIVIL,ZESTRIL) 5 MG tablet Take 1 tablet (5 mg total) by mouth daily. 04/15/14   Darlyne Russian, MD  Multiple Vitamin (MULTIVITAMIN WITH  MINERALS) TABS Take 1 tablet by mouth daily.     Historical Provider, MD  nitroGLYCERIN (NITROSTAT) 0.4 MG SL tablet Place 1 tablet (0.4 mg total) under the tongue every 5 (five) minutes x 3 doses as needed for chest pain. 06/24/13   Rhonda G Barrett, PA-C  NON FORMULARY FINITI ( ANTI-AGING) [place]    Historical Provider, MD  omeprazole (PRILOSEC) 20 MG capsule Take 40 mg by mouth 2 (two) times daily.     Historical Provider, MD  pravastatin (PRAVACHOL) 10 MG tablet Take 1 tablet (10 mg total) by mouth every evening. 09/27/14   Blane Ohara, MD   History   Social History  . Marital Status: Divorced    Spouse Name: N/A  . Number of Children: N/A  . Years of Education: N/A   Occupational History  . Not on file.   Social History Main Topics  . Smoking status: Never Smoker   . Smokeless tobacco: Never Used  . Alcohol Use: Yes     Comment: Occasional  . Drug Use: No  . Sexual Activity: No   Other Topics Concern  . Not on file   Social History Narrative   Exercise dancing 3 x times weekly for 1-2 hours   Review of Systems  Constitutional: Negative for appetite change and fatigue.  HENT: Negative for congestion, ear discharge and sinus pressure.   Eyes: Negative for discharge.  Respiratory: Negative for cough.   Cardiovascular: Negative for chest pain.  Gastrointestinal: Negative for abdominal pain and diarrhea.  Genitourinary: Negative for frequency and hematuria.  Musculoskeletal: Positive for arthralgias. Negative for back pain.  Skin: Negative for rash.  Neurological: Negative for seizures and headaches.  Psychiatric/Behavioral: Negative for hallucinations.       Objective:   Physical Exam  Constitutional: She appears well-developed and well-nourished. No distress.  HENT:  Head: Normocephalic and atraumatic.  Eyes: Conjunctivae are normal. Right eye exhibits no discharge. Left eye exhibits no discharge.  Neck: Neck supple.  Cardiovascular: Normal rate,  regular rhythm and normal heart sounds.  Exam reveals no gallop and no friction rub.   No murmur heard. Pulmonary/Chest: Effort normal and breath sounds normal. No respiratory distress.  Abdominal: Soft. She exhibits no distension. There is no tenderness.  Musculoskeletal: She exhibits no edema or tenderness.  Mild tenderness to right upper arm. Mild tenderness to left supraclavicular area. All 4 extremities are without swelling or erythema.  Neurological: She is alert.  Skin: Skin is warm and dry.  Psychiatric: She has a normal mood and affect. Her behavior is normal. Thought content normal.  Nursing note  and vitals reviewed.  Filed Vitals:   01/13/15 1302  BP: 132/78  Pulse: 88  Temp: 97.8 F (36.6 C)  TempSrc: Oral  Resp: 18  SpO2: 98%  EKG is essentially no change from previous. There may be a half to one millimeter elevation in lead V3 but no significant change from previous there is T-wave inversion in aVL.  Assessment & Plan:  Patient presents with very atypical chest discomfort. She took nitroglycerin with relief. She has a persistent soreness in her right upper arm and some soreness in the left suprascapular area. Her EKG has minimal changes from previous. I do feel like enzymes are indicated. Patient will be sent to the hospital for troponins.I personally performed the services described in this documentation, which was scribed in my presence. The recorded information has been reviewed and is accurate.

## 2015-01-13 NOTE — ED Notes (Signed)
MD Wilson Singer reviewed patient EKG from urgent care that was obtained approx an hour ago, states not to repeat at this time.

## 2015-01-16 ENCOUNTER — Telehealth: Payer: Self-pay | Admitting: Cardiovascular Disease

## 2015-01-16 NOTE — Telephone Encounter (Signed)
New message      Pt said on Friday the 19th at 1:30am she had rt arm pain---took tylenol.  At 10:30 arm pain again and 2 "jabs" in the chest.  Pt took nitro.  Went to urgent care and saw Dr Baird Lyons normal but went to ER by EMS.  ER visit was normal and she was released.  Now, pt is extreme fatigued.  When pt had her heart attack July 2014 she was extreme fatigued 2wks prior to actual heart attack.  Pt want to talk to a nurse

## 2015-01-16 NOTE — Telephone Encounter (Signed)
Pt concerned because her symptoms prior to previous MI was extreme fatigue for 2 weeks before MI.   Pt concerned because she received phone call from ER today asking how she was doing.

## 2015-01-16 NOTE — Telephone Encounter (Signed)
Pt was advised by ED nurse today to contact Dr Burt Knack and advise him of her symptoms of extreme fatigue.    I offered pt tomorrow with [facility] for evaluation of her extreme fatigue.   Pt declined appt tomorrow and states she cannot adjust her work schedule to come to appt.   Pt advised I will forward to Dr Burt Knack for review.

## 2015-01-16 NOTE — Telephone Encounter (Signed)
    Expand All Collapse All   New message      Pt said on Friday the 19th at 1:30am she had rt arm pain---took tylenol. At 10:30 arm pain again and 2 "jabs" in the chest. Pt took nitro. Went to urgent care and saw Dr Baird Lyons normal but went to ER by EMS. ER visit was normal and she was released. Now, pt is extreme fatigued. When pt had her heart attack July 2014 she was extreme fatigued 2wks prior to actual heart attack. Pt want to talk to a nurse

## 2015-01-17 ENCOUNTER — Ambulatory Visit: Payer: BLUE CROSS/BLUE SHIELD | Admitting: Physician Assistant

## 2015-01-17 NOTE — Telephone Encounter (Signed)
I spoke with the pt and she cannot come into the office this week due to her work schedule. The pt needs an early morning or late afternoon appointment due to her work.  I have scheduled her to see Richardson Dopp on 01/27/15 at 3:40. I advised the pt to contact the office with any change in her symptoms. Pt agreed with plan.

## 2015-01-17 NOTE — Telephone Encounter (Signed)
Please arrange FOV with me or PA/NP when she can come in. thx

## 2015-01-17 NOTE — Telephone Encounter (Signed)
Follow up     Pt has extreme fatigue.  She had an EKG at the ER on last Friday because of high bp.  She want Dr Burt Knack to know and see what he suggest

## 2015-01-19 NOTE — ED Provider Notes (Signed)
CSN: [id number]     Arrival date & time 01/13/15  1411 History   First MD Initiated Contact with Patient 01/13/15 1413     Chief Complaint  Patient presents with  . Arm Pain  . Chest Pain     (Consider location/radiation/quality/duration/timing/severity/associated sxs/prior Treatment) HPI   73yF with R arm pain. Onset earlier this morning. Resolved with tylenol but then returned again while at work. Mild ache in upper arm. Somewhat sore to touch.Denies trauma/strain/overuse. No rash/swelling. No fever or chills. Denies CP to me. No neck pain. Denies exertional symptoms. Seen at [facility] prior to arrival and referred to ED for further eval.   Past Medical History  Diagnosis Date  . GERD (gastroesophageal reflux disease)   . Barrett's esophagus     Stage B  . Cystocele   . Rectocele     mild  . Hypertension   . Diverticulitis   . Arthritis   . Raynaud disease   . CREST variant of scleroderma   . Osteopenia 09/2012    T score -2.4 FRAX 13%/2.7%  per 10/15/2012 discussion plan repeat in 2 years  . Anemia   . STEMI (ST elevation myocardial infarction) 06/21/2013  . CAD (coronary artery disease)   . Hyperlipidemia    Past Surgical History  Procedure Laterality Date  . Tonsillectomy  1950  . Tubal ligation  1980  . Nissen fundoplication  1308  . Total knee arthroplasty      right  . Joint replacement  9/10    rt total  . Foot surgery  2013    x2  . Abdominal hysterectomy  1996    RSO for menorrhagia.  . Coronary angioplasty with stent placement  06/21/2013   Family History  Problem Relation Age of Onset  . Cancer Mother     Pancreatic Cancer  . Hepatitis C Mother   . Heart disease Father     congestive heart failure  . Panic disorder Daughter   . Asthma Maternal Grandmother   . Emphysema Maternal Grandmother   . Cancer Maternal Grandfather     lung   History  Substance Use Topics  . Smoking status: Never Smoker   . Smokeless tobacco: Never Used  . Alcohol Use: Yes      Comment: Occasional   OB History    Gravida Para Term Preterm AB TAB SAB Ectopic Multiple Living   1 1        1      Review of Systems  All systems reviewed and negative, other than as noted in HPI.   Allergies  Aspirin; Nsaids; and Statins  Home Medications   Prior to Admission medications   Medication Sig Start Date End Date Taking? Authorizing Provider  aspirin EC 81 MG EC tablet Take 1 tablet (81 mg total) by mouth daily. 06/24/13   Rhonda G Barrett, PA-C  benzonatate (TESSALON) 100 MG capsule Take 1-2 capsules (100-200 mg total) by mouth 3 (three) times daily as needed for cough. 08/29/14   Darlyne Russian, MD  cephALEXin (KEFLEX) 500 MG capsule Take 1 capsule (500 mg total) by mouth 2 (two) times daily. 08/19/14   Darreld Mclean, MD  doxycycline (VIBRAMYCIN) 100 MG capsule Prn dental 03/08/14   Karen Chafe Molpus, MD  ezetimibe (ZETIA) 10 MG tablet Take 0.5 tablets (5 mg total) by mouth daily. 09/26/14   Blane Ohara, MD  lisinopril (PRINIVIL,ZESTRIL) 5 MG tablet Take 1 tablet (5 mg total) by mouth daily. 04/15/14  Darlyne Russian, MD  Multiple Vitamin (MULTIVITAMIN WITH MINERALS) TABS Take 1 tablet by mouth daily.     Historical Provider, MD  nitroGLYCERIN (NITROSTAT) 0.4 MG SL tablet Place 1 tablet (0.4 mg total) under the tongue every 5 (five) minutes x 3 doses as needed for chest pain. 06/24/13   Rhonda G Barrett, PA-C  NON FORMULARY FINITI ( ANTI-AGING) [place]    Historical Provider, MD  omeprazole (PRILOSEC) 20 MG capsule Take 40 mg by mouth 2 (two) times daily.     Historical Provider, MD  pravastatin (PRAVACHOL) 10 MG tablet Take 1 tablet (10 mg total) by mouth every evening. 09/27/14   Blane Ohara, MD   BP 118/50 mmHg  Pulse 62  Temp(Src) 98.1 F (36.7 C) (Oral)  Resp 17  SpO2 99%  LMP 11/25/1994 Physical Exam  Constitutional: She appears well-developed and well-nourished. No distress.  HENT:  Head: Normocephalic and atraumatic.  Eyes: Conjunctivae  are normal. Right eye exhibits no discharge. Left eye exhibits no discharge.  Neck: Neck supple.  Cardiovascular: Normal rate, regular rhythm and normal heart sounds.  Exam reveals no gallop and no friction rub.   No murmur heard. Pulmonary/Chest: Effort normal and breath sounds normal. No respiratory distress.  Abdominal: Soft. She exhibits no distension. There is no tenderness.  Musculoskeletal: She exhibits tenderness. She exhibits no edema.  Mild TTP R upper arm, lateral shoulder. No concerning skin changes. Symmetric as compared to L. NVI. Can actively range with no apparent discomfort.   Neurological: She is alert.  Skin: Skin is warm and dry.  Psychiatric: She has a normal mood and affect. Her behavior is normal. Thought content normal.  Nursing note and vitals reviewed.   ED Course  Procedures (including critical care time) Labs Review Labs Reviewed  CBC - Abnormal; Notable for the following:    Platelets 144 (*)    All other components within normal limits  BASIC METABOLIC PANEL - Abnormal; Notable for the following:    Glucose, Bld 104 (*)    GFR calc non Af Amer 70 (*)    GFR calc Af Amer 81 (*)    All other components within normal limits  I-STAT [place]OPOIN[place], ED    Imaging Review No results found.   EKG Interpretation   Date/Time:  Friday January 13 2015 14:53:46 EST Ventricular Rate:  59 PR Interval:  72 QRS Duration: 117 QT Interval:  460 QTC Calculation: 456 R Axis:   28 Text Interpretation:  Sinus rhythm Short PR interval Incomplete right  bundle branch block Minimal ST elevation, inferior leads ED PHYSICIAN  INTERPRETATION AVAILABLE IN [facility] Confirmed by TEST, Record  ([id number]) on 01/16/2015 6:51:43 AM      MDM   Final diagnoses:  Right arm pain    73yF with R arm pain. Consider ACS, but my suspicion if low. EKG not normal, but not overlying concerning either particularly in light of such atypical symptoms. Muscular? Radicular? NVI. Low  suspicion for emergent process. Symptomatic tx. Return precautions discussed.     Virgel Manifold, MD 01/19/15 1136

## 2015-01-23 ENCOUNTER — Emergency Department (HOSPITAL_COMMUNITY)
Admission: EM | Admit: 2015-01-23 | Discharge: 2015-01-23 | Disposition: A | Discharge status: Home / Self Care | Payer: BLUE CROSS/BLUE SHIELD

## 2015-01-23 ENCOUNTER — Telehealth: Payer: Self-pay | Admitting: Cardiovascular Disease

## 2015-01-23 NOTE — Telephone Encounter (Signed)
New Msg        Pt thinks she may be about to have a heart attack, states it has been going on for about 11 days.  Having left arm pain and fatigue.  Pt has an appt scheduled with Richardson Dopp on 02/01/15, wants to be seen earlier.  Please return pt call.

## 2015-01-23 NOTE — Telephone Encounter (Signed)
I spoke with the pt and over the weekend the pt has developed aching in her left shoulder and arm.  The pt has also noticed episodes of being lightheaded and her fatigue has continued.The pt's BP "was all over the place" this weekend.  The pt is becoming concerned with her symptoms and feels like she is on the verge of having a heart attack.  I offered the pt an appointment today but made her aware that we can perform an EKG and labs in the office.  If tests are ordered then they would not be performed today. I advised her that due to her progressing symptoms I would recommend that she seek evaluation in the ER.  The pt agreed with this plan and will speak to her employer to see if she can leave work.

## 2015-01-24 ENCOUNTER — Emergency Department (HOSPITAL_COMMUNITY)
Admission: EM | Admit: 2015-01-24 | Discharge: 2015-01-24 | Disposition: A | Discharge status: Home / Self Care | Payer: Medicare Other | Attending: Emergency Medicine | Admitting: Emergency Medicine

## 2015-01-24 ENCOUNTER — Encounter (HOSPITAL_COMMUNITY): Payer: Self-pay | Admitting: Emergency Medicine

## 2015-01-24 ENCOUNTER — Emergency Department (HOSPITAL_COMMUNITY): Payer: Medicare Other

## 2015-01-24 DIAGNOSIS — E785 Hyperlipidemia, unspecified: Secondary | ICD-10-CM | POA: Diagnosis not present

## 2015-01-24 DIAGNOSIS — Z862 Personal history of diseases of the blood and blood-forming organs and certain disorders involving the immune mechanism: Secondary | ICD-10-CM | POA: Diagnosis not present

## 2015-01-24 DIAGNOSIS — K2270 Barrett's esophagus without dysplasia: Secondary | ICD-10-CM | POA: Diagnosis not present

## 2015-01-24 DIAGNOSIS — I10 Essential (primary) hypertension: Secondary | ICD-10-CM | POA: Diagnosis not present

## 2015-01-24 DIAGNOSIS — R0789 Other chest pain: Secondary | ICD-10-CM

## 2015-01-24 DIAGNOSIS — K219 Gastro-esophageal reflux disease without esophagitis: Secondary | ICD-10-CM | POA: Diagnosis not present

## 2015-01-24 DIAGNOSIS — M1990 Unspecified osteoarthritis, unspecified site: Secondary | ICD-10-CM | POA: Insufficient documentation

## 2015-01-24 DIAGNOSIS — Z8679 Personal history of other diseases of the circulatory system: Secondary | ICD-10-CM

## 2015-01-24 DIAGNOSIS — Z79899 Other long term (current) drug therapy: Secondary | ICD-10-CM | POA: Diagnosis not present

## 2015-01-24 DIAGNOSIS — I2510 Atherosclerotic heart disease of native coronary artery without angina pectoris: Secondary | ICD-10-CM | POA: Diagnosis not present

## 2015-01-24 DIAGNOSIS — Z7982 Long term (current) use of aspirin: Secondary | ICD-10-CM | POA: Insufficient documentation

## 2015-01-24 DIAGNOSIS — R531 Weakness: Secondary | ICD-10-CM | POA: Diagnosis not present

## 2015-01-24 DIAGNOSIS — Z8742 Personal history of other diseases of the female genital tract: Secondary | ICD-10-CM | POA: Insufficient documentation

## 2015-01-24 DIAGNOSIS — R079 Chest pain, unspecified: Secondary | ICD-10-CM | POA: Diagnosis present

## 2015-01-24 DIAGNOSIS — Z792 Long term (current) use of antibiotics: Secondary | ICD-10-CM | POA: Insufficient documentation

## 2015-01-24 LAB — COMPREHENSIVE METABOLIC PANEL
ALBUMIN: 3.7 g/dL (ref 3.5–5.2)
ALK PHOS: 75 U/L (ref 39–117)
ALT: 20 U/L (ref 0–35)
AST: 22 U/L (ref 0–37)
Anion gap: 7 (ref 5–15)
BILIRUBIN TOTAL: 0.4 mg/dL (ref 0.3–1.2)
BUN: 17 mg/dL (ref 6–23)
CHLORIDE: 109 mmol/L (ref 96–112)
CO2: 26 mmol/L (ref 19–32)
Calcium: 9.2 mg/dL (ref 8.4–10.5)
Creatinine, Ser: 0.78 mg/dL (ref 0.50–1.10)
GFR calc Af Amer: >90 mL/min (ref >90)
GFR calc non Af Amer: 82 mL/min — ABNORMAL LOW (ref >90)
Glucose, Bld: 99 mg/dL (ref 70–99)
POTASSIUM: 4 mmol/L (ref 3.5–5.1)
SODIUM: 142 mmol/L (ref 135–145)
TOTAL PROTEIN: 6.2 g/dL (ref 6.0–8.3)

## 2015-01-24 LAB — CBC WITH DIFFERENTIAL/PLATELET
BASOS ABS: 0.1 10*3/uL (ref 0.0–0.1)
BASOS PCT: 1 % (ref 0–1)
EOS ABS: 0.2 10*3/uL (ref 0.0–0.7)
EOS PCT: 3 % (ref 0–5)
HCT: 38 % (ref 36.0–46.0)
Hemoglobin: 12.1 g/dL (ref 12.0–15.0)
LYMPHS PCT: 28 % (ref 12–46)
Lymphs Abs: 1.9 10*3/uL (ref 0.7–4.0)
MCH: 27.9 pg (ref 26.0–34.0)
MCHC: 31.8 g/dL (ref 30.0–36.0)
MCV: 87.8 fL (ref 78.0–100.0)
MONO ABS: 0.8 10*3/uL (ref 0.1–1.0)
Monocytes Relative: 13 % — ABNORMAL HIGH (ref 3–12)
Neutro Abs: 3.7 10*3/uL (ref 1.7–7.7)
Neutrophils Relative %: 55 % (ref 43–77)
PLATELETS: 160 10*3/uL (ref 150–400)
RBC: 4.33 MIL/uL (ref 3.87–5.11)
RDW: 14.2 % (ref 11.5–15.5)
WBC: 6.7 10*3/uL (ref 4.0–10.5)

## 2015-01-24 LAB — I-STAT TROPONIN, ED: Troponin i, poc: 0 ng/mL (ref 0.00–0.08)

## 2015-01-24 MED ORDER — ADENOSINE 6 MG/2ML IV SOLN: 6.0000 mg | Freq: Once | INTRAVENOUS | Status: DC

## 2015-01-24 NOTE — Discharge Instructions (Signed)
The cardiology clinic will call you to arrange a follow-up appointment for later this week.  Return to the emergency department if your symptoms substantially worsen or change.   Chest Pain (Nonspecific) It is often hard to give a specific diagnosis for the cause of chest pain. There is always a chance that your pain could be related to something serious, such as a heart attack or a blood clot in the lungs. You need to follow up with your health care provider for further evaluation. CAUSES   Heartburn.  Pneumonia or bronchitis.  Anxiety or stress.  Inflammation around your heart (pericarditis) or lung (pleuritis or pleurisy).  A blood clot in the lung.  A collapsed lung (pneumothorax). It can develop suddenly on its own (spontaneous pneumothorax) or from trauma to the chest.  Shingles infection (herpes zoster virus). The chest wall is composed of bones, muscles, and cartilage. Any of these can be the source of the pain.  The bones can be bruised by injury.  The muscles or cartilage can be strained by coughing or overwork.  The cartilage can be affected by inflammation and become sore (costochondritis). DIAGNOSIS  Lab tests or other studies may be needed to find the cause of your pain. Your health care provider may have you take a test called an ambulatory electrocardiogram (ECG). An ECG records your heartbeat patterns over a 24-hour period. You may also have other tests, such as:  Transthoracic echocardiogram (TTE). During echocardiography, sound waves are used to evaluate how blood flows through your heart.  Transesophageal echocardiogram (TEE).  Cardiac monitoring. This allows your health care provider to monitor your heart rate and rhythm in real time.  Holter monitor. This is a portable device that records your heartbeat and can help diagnose heart arrhythmias. It allows your health care provider to track your heart activity for several days, if needed.  Stress tests by  exercise or by giving medicine that makes the heart beat faster. TREATMENT   Treatment depends on what may be causing your chest pain. Treatment may include:  Acid blockers for heartburn.  Anti-inflammatory medicine.  Pain medicine for inflammatory conditions.  Antibiotics if an infection is present.  You may be advised to change lifestyle habits. This includes stopping smoking and avoiding alcohol, caffeine, and chocolate.  You may be advised to keep your head raised (elevated) when sleeping. This reduces the chance of acid going backward from your stomach into your esophagus. Most of the time, nonspecific chest pain will improve within 2-3 days with rest and mild pain medicine.  HOME CARE INSTRUCTIONS   If antibiotics were prescribed, take them as directed. Finish them even if you start to feel better.  For the next few days, avoid physical activities that bring on chest pain. Continue physical activities as directed.  Do not use any tobacco products, including cigarettes, chewing tobacco, or electronic cigarettes.  Avoid drinking alcohol.  Only take medicine as directed by your health care provider.  Follow your health care provider's suggestions for further testing if your chest pain does not go away.  Keep any follow-up appointments you made. If you do not go to an appointment, you could develop lasting (chronic) problems with pain. If there is any problem keeping an appointment, call to reschedule. SEEK MEDICAL CARE IF:   Your chest pain does not go away, even after treatment.  You have a rash with blisters on your chest.  You have a fever. SEEK IMMEDIATE MEDICAL CARE IF:   You have  increased chest pain or pain that spreads to your arm, neck, jaw, back, or abdomen.  You have shortness of breath.  You have an increasing cough, or you cough up blood.  You have severe back or abdominal pain.  You feel nauseous or vomit.  You have severe weakness.  You  faint.  You have chills. This is an emergency. Do not wait to see if the pain will go away. Get medical help at once. Call your local emergency services (911 in [place] Do not drive yourself to the hospital. MAKE SURE YOU:   Understand these instructions.  Will watch your condition.  Will get help right away if you are not doing well or get worse. Document Released: 08/21/2005 Document Revised: 11/16/2013 Document Reviewed: 06/16/2008 [facility] Patient Information 2015 [place], [place]. This information is not intended to replace advice given to you by your health care provider. Make sure you discuss any questions you have with your health care provider.  Fatigue Fatigue is a feeling of tiredness, lack of energy, lack of motivation, or feeling tired all the time. Having enough rest, good nutrition, and reducing stress will normally reduce fatigue. Consult your caregiver if it persists. The nature of your fatigue will help your caregiver to find out its cause. The treatment is based on the cause.  CAUSES  There are many causes for fatigue. Most of the time, fatigue can be traced to one or more of your habits or routines. Most causes fit into one or more of three general areas. They are: Lifestyle problems  Sleep disturbances.  Overwork.  Physical exertion.  Unhealthy habits.  Poor eating habits or eating disorders.  Alcohol and/or drug use .  Lack of proper nutrition (malnutrition). Psychological problems  Stress and/or anxiety problems.  Depression.  Grief.  Boredom. Medical Problems or Conditions  Anemia.  Pregnancy.  Thyroid gland problems.  Recovery from major surgery.  Continuous pain.  Emphysema or asthma that is not well controlled  Allergic conditions.  Diabetes.  Infections (such as mononucleosis).  Obesity.  Sleep disorders, such as sleep apnea.  Heart failure or other heart-related problems.  Cancer.  Kidney disease.  Liver  disease.  Effects of certain medicines such as antihistamines, cough and cold remedies, prescription pain medicines, heart and blood pressure medicines, drugs used for treatment of cancer, and some antidepressants. SYMPTOMS  The symptoms of fatigue include:   Lack of energy.  Lack of drive (motivation).  Drowsiness.  Feeling of indifference to the surroundings. DIAGNOSIS  The details of how you feel help guide your caregiver in finding out what is causing the fatigue. You will be asked about your present and past health condition. It is important to review all medicines that you take, including prescription and non-prescription items. A thorough exam will be done. You will be questioned about your feelings, habits, and normal lifestyle. Your caregiver may suggest blood tests, urine tests, or other tests to look for common medical causes of fatigue.  TREATMENT  Fatigue is treated by correcting the underlying cause. For example, if you have continuous pain or depression, treating these causes will improve how you feel. Similarly, adjusting the dose of certain medicines will help in reducing fatigue.  HOME CARE INSTRUCTIONS   Try to get the required amount of good sleep every night.  Eat a healthy and nutritious diet, and drink enough water throughout the day.  Practice ways of relaxing (including yoga or meditation).  Exercise regularly.  Make plans to change situations that  cause stress. Act on those plans so that stresses decrease over time. Keep your work and personal routine reasonable.  Avoid street drugs and minimize use of alcohol.  Start taking a daily multivitamin after consulting your caregiver. SEEK MEDICAL CARE IF:   You have persistent tiredness, which cannot be accounted for.  You have fever.  You have unintentional weight loss.  You have headaches.  You have disturbed sleep throughout the night.  You are feeling sad.  You have constipation.  You have dry  skin.  You have gained weight.  You are taking any new or different medicines that you suspect are causing fatigue.  You are unable to sleep at night.  You develop any unusual swelling of your legs or other parts of your body. SEEK IMMEDIATE MEDICAL CARE IF:   You are feeling confused.  Your vision is blurred.  You feel faint or pass out.  You develop severe headache.  You develop severe abdominal, pelvic, or back pain.  You develop chest pain, shortness of breath, or an irregular or fast heartbeat.  You are unable to pass a normal amount of urine.  You develop abnormal bleeding such as bleeding from the rectum or you vomit blood.  You have thoughts about harming yourself or committing suicide.  You are worried that you might harm someone else. MAKE SURE YOU:   Understand these instructions.  Will watch your condition.  Will get help right away if you are not doing well or get worse. Document Released: 09/08/2007 Document Revised: 02/03/2012 Document Reviewed: 03/15/2014 [facility] Patient Information 2015 [place], [place]. This information is not intended to replace advice given to you by your health care provider. Make sure you discuss any questions you have with your health care provider.

## 2015-01-24 NOTE — ED Notes (Addendum)
Pt states Dr. Burt Knack sent her here after pt called the doctors office. Has been having jabbing pain in left chest for 12 days associated with aching right arm. Pt also states increasing fatigue and sob with exertion as well. NAD at this time. A/O x4. Hx of MI July of 2015

## 2015-01-24 NOTE — ED Provider Notes (Signed)
CSN: [id number]     Arrival date & time 01/24/15  0901 History   First MD Initiated Contact with Patient 01/24/15 [phone]     Chief Complaint  Patient presents with  . Arm Pain  . Chest Pain     (Consider location/radiation/quality/duration/timing/severity/associated sxs/prior Treatment) HPI Comments: Patient is a 74 year old female with history of coronary artery disease status post coronary stenting in August 2014. She presents today for evaluation of a 12 day history of progressive weakness and decreased energy. For the past 2 days she has been experiencing sharp pains in her upper chest and right shoulder that occur intermittently and last for only seconds. There are no aggravating or alleviating factors. She denies any shortness of breath, nausea, diaphoresis with these symptoms. Her symptoms are different from what she experienced with her prior MI.  She is a patient of Dr. Burt Knack from the cardiology service.  Patient is a 74 y.o. female presenting with chest pain. The history is provided by the patient.  Chest Pain Chest pain location: Right upper chest and shoulder. Pain quality: stabbing   Pain radiates to:  Does not radiate Pain radiates to the back: no   Pain severity:  Moderate Timing:  Intermittent Progression:  Worsening Chronicity:  New Relieved by:  Nothing Worsened by:  Nothing tried Ineffective treatments:  None tried   Past Medical History  Diagnosis Date  . GERD (gastroesophageal reflux disease)   . Barrett's esophagus     Stage B  . Cystocele   . Rectocele     mild  . Hypertension   . Diverticulitis   . Arthritis   . Raynaud disease   . CREST variant of scleroderma   . Osteopenia 09/2012    T score -2.4 FRAX 13%/2.7%  per 10/15/2012 discussion plan repeat in 2 years  . Anemia   . STEMI (ST elevation myocardial infarction) 06/21/2013  . CAD (coronary artery disease)   . Hyperlipidemia    Past Surgical History  Procedure Laterality Date  .  Tonsillectomy  1950  . Tubal ligation  1980  . Nissen fundoplication  9323  . Total knee arthroplasty      right  . Joint replacement  9/10    rt total  . Foot surgery  2013    x2  . Abdominal hysterectomy  1996    RSO for menorrhagia.  . Coronary angioplasty with stent placement  06/21/2013   Family History  Problem Relation Age of Onset  . Cancer Mother     Pancreatic Cancer  . Hepatitis C Mother   . Heart disease Father     congestive heart failure  . Panic disorder Daughter   . Asthma Maternal Grandmother   . Emphysema Maternal Grandmother   . Cancer Maternal Grandfather     lung   History  Substance Use Topics  . Smoking status: Never Smoker   . Smokeless tobacco: Never Used  . Alcohol Use: Yes     Comment: Occasional   OB History    Gravida Para Term Preterm AB TAB SAB Ectopic Multiple Living   1 1        1      Review of Systems  Cardiovascular: Positive for chest pain.  All other systems reviewed and are negative.     Allergies  Aspirin; Nsaids; and Statins  Home Medications   Prior to Admission medications   Medication Sig Start Date End Date Taking? Authorizing Provider  aspirin EC 81 MG EC tablet Take  1 tablet (81 mg total) by mouth daily. 06/24/13   Rhonda G Barrett, PA-C  benzonatate (TESSALON) 100 MG capsule Take 1-2 capsules (100-200 mg total) by mouth 3 (three) times daily as needed for cough. 08/29/14   Darlyne Russian, MD  cephALEXin (KEFLEX) 500 MG capsule Take 1 capsule (500 mg total) by mouth 2 (two) times daily. 08/19/14   Darreld Mclean, MD  doxycycline (VIBRAMYCIN) 100 MG capsule Prn dental 03/08/14   Karen Chafe Molpus, MD  ezetimibe (ZETIA) 10 MG tablet Take 0.5 tablets (5 mg total) by mouth daily. 09/26/14   Blane Ohara, MD  lisinopril (PRINIVIL,ZESTRIL) 5 MG tablet Take 1 tablet (5 mg total) by mouth daily. 04/15/14   Darlyne Russian, MD  Multiple Vitamin (MULTIVITAMIN WITH MINERALS) TABS Take 1 tablet by mouth daily.     Historical  Provider, MD  nitroGLYCERIN (NITROSTAT) 0.4 MG SL tablet Place 1 tablet (0.4 mg total) under the tongue every 5 (five) minutes x 3 doses as needed for chest pain. 06/24/13   Rhonda G Barrett, PA-C  NON FORMULARY FINITI ( ANTI-AGING) [place]    Historical Provider, MD  omeprazole (PRILOSEC) 20 MG capsule Take 40 mg by mouth 2 (two) times daily.     Historical Provider, MD  pravastatin (PRAVACHOL) 10 MG tablet Take 1 tablet (10 mg total) by mouth every evening. 09/27/14   Blane Ohara, MD   BP 141/79 mmHg  Pulse 62  Temp(Src) 98.1 F (36.7 C) (Oral)  Resp 15  SpO2 99%  LMP 11/25/1994 Physical Exam  Constitutional: She is oriented to person, place, and time. She appears well-developed and well-nourished. No distress.  HENT:  Head: Normocephalic and atraumatic.  Mouth/Throat: Oropharynx is clear and moist.  Neck: Normal range of motion. Neck supple.  Cardiovascular: Normal rate and regular rhythm.  Exam reveals no gallop and no friction rub.   No murmur heard. Pulmonary/Chest: Effort normal and breath sounds normal. No respiratory distress. She has no wheezes.  Abdominal: Soft. Bowel sounds are normal. She exhibits no distension. There is no tenderness.  Musculoskeletal: Normal range of motion. She exhibits no edema.  Lymphadenopathy:    She has no cervical adenopathy.  Neurological: She is alert and oriented to person, place, and time.  Skin: Skin is warm and dry. She is not diaphoretic.  Nursing note and vitals reviewed.   ED Course  Procedures (including critical care time) Labs Review Labs Reviewed  COMPREHENSIVE METABOLIC PANEL  CBC WITH DIFFERENTIAL/PLATELET  URINALYSIS, ROUTINE W REFLEX MICROSCOPIC  I-STAT [place], ED  I-STAT TROPOININ, ED    Imaging Review No results found.   EKG Interpretation   Date/Time:  Tuesday January 24 2015 09:11:25 EST Ventricular Rate:  69 PR Interval:  155 QRS Duration: 100 QT Interval:  431 QTC Calculation: 462 R Axis:    73 Text Interpretation:  Sinus rhythm RSR' in V1 or V2, right VCD or RVH  Confirmed by DELOS  MD, Ronnetta Currington ([id number]) on 01/24/2015 9:18:02 AM      MDM   Final diagnoses:  None    Patient is a 74 year old female with history of coronary artery disease with stents placed one year ago. She presents today for evaluation of weakness which is progressed over the past 12 days. For the past 2 days, she has experienced sharp "jib-jabbing" pains in her upper chest that last for seconds. Her symptoms are inconsistent with her prior cardiac symptoms. Her workup is unremarkable with an unchanged EKG and  negative troponin. I have spoken with the cardiology master who will arrange follow-up in the office this week. She understands to return if her symptoms worsen or change.    Veryl Speak, MD 01/24/15 [phone]

## 2015-01-25 ENCOUNTER — Ambulatory Visit (INDEPENDENT_AMBULATORY_CARE_PROVIDER_SITE_OTHER): Payer: BLUE CROSS/BLUE SHIELD | Admitting: Physician Assistant

## 2015-01-25 ENCOUNTER — Encounter: Payer: Self-pay | Admitting: Physician Assistant

## 2015-01-25 ENCOUNTER — Encounter: Payer: Self-pay | Admitting: *Deleted

## 2015-01-25 VITALS — BP 130/68 | HR 74 | Ht 61.0 in | Wt 128.0 lb

## 2015-01-25 DIAGNOSIS — E785 Hyperlipidemia, unspecified: Secondary | ICD-10-CM

## 2015-01-25 DIAGNOSIS — I2510 Atherosclerotic heart disease of native coronary artery without angina pectoris: Secondary | ICD-10-CM

## 2015-01-25 DIAGNOSIS — I10 Essential (primary) hypertension: Secondary | ICD-10-CM

## 2015-01-25 DIAGNOSIS — R079 Chest pain, unspecified: Secondary | ICD-10-CM

## 2015-01-25 DIAGNOSIS — R5382 Chronic fatigue, unspecified: Secondary | ICD-10-CM

## 2015-01-25 LAB — TSH: TSH: 1.28 u[IU]/mL (ref 0.35–4.50)

## 2015-01-25 NOTE — Patient Instructions (Signed)
Your physician has requested that you have en exercise stress myoview. For further information please visit [email or web address]. Please follow instruction sheet, as given.  Your physician recommends that you schedule a follow-up appointment in: [place], [place] IS IN THE OFFICE  LAB WORK TODAY; T[facility]  A REFILL FOR NTG WAS SENT IN TODAY

## 2015-01-25 NOTE — Progress Notes (Signed)
Cardiology Office Note   Date:  01/25/2015   ID:  Kristine [name], DOB 12-25-1940, MRN [id number]  PCP:  Jenny Reichmann, MD  Cardiologist:  Dr. [name]     Chief Complaint  Patient presents with  . Chest Pain     History of Present Illness: Kristine [name] is a 74 y.o. female with a hx of CAD status post lateral STEMI 05/2013 treated with a DES to the OM1, HTN, HL. EF was 35-40% at the time of her cardiac catheterization. Follow-up echocardiogram demonstrated normal LV function with an EF of 50-55%. She had residual disease on her LHC including an occluded small diagonal that filled by L collaterals.  Last seen by Dr. Burt Knack 06/2014.   She was recently seen in the ED 2/19 and 3/1 with complaints of chest pain.  CEs and EKGs were unremarkable.  CXR was without acute findings.  She returns for evaluation.    She awoke about 2 weeks ago with right arm pain. This was fairly severe. She describes it as dull. She has radiation down into her elbow. It has continued intermittently. She now notes some left shoulder discomfort. Her right arm symptoms are worse than her left. She has constantly felt lightheaded and dizzy. She denies syncope or near-syncope. She feels fatigued. She does note more breathlessness with activity. She is NYHA 2-2b. She denies exertional chest pain. She has had some atypical brief episodes of chest discomfort that she describes as a "jabbing." She denies orthopnea, PND or edema. She did take nitroglycerin one occasion without relief. The symptoms are not reminiscent of her previous angina. She denies pleuritic symptoms or chest discomfort with lying supine. She denies cough or wheezing. She denies fever.   Studies/Reports Reviewed Today:  Echocardiogram 05/2013 - Mild focal basal hypertrophy of the septum. EF 50-55%. Apical HK. Grade 1 diastolic dysfunction  - Aortic valve: Trivial regurgitation.  Cardiac Cath/PCI 05/2013 LM:  20% LAD:   Mid-vessel with 30-40% stenosis. Small  diagonal occluded fills from left-left collaterals with late filling. LCx:   1st OM with 90% stenosis, 2nd Om with mild diffuse 30-40% stenosis  RCA:   Mid-vessel with 50-60% stenosis.   Left ventriculography: Large area of lateral wall/anterolateral AK, EF 35-40%. PCI Data:  Proximal OM Stent 2.75x16 mm Promus  Carotid US 12/2010 Bilat ICA < 50%    Past Medical History  Diagnosis Date  . GERD (gastroesophageal reflux disease)   . Barrett's esophagus     Stage B  . Cystocele   . Rectocele     mild  . Hypertension   . Diverticulitis   . Arthritis   . Raynaud disease   . CREST variant of scleroderma   . Osteopenia 09/2012    T score -2.4 FRAX 13%/2.7%  per 10/15/2012 discussion plan repeat in 2 years  . Anemia   . STEMI (ST elevation myocardial infarction) 06/21/2013  . CAD (coronary artery disease)   . Hyperlipidemia     Past Surgical History  Procedure Laterality Date  . Tonsillectomy  1950  . Tubal ligation  1980  . Nissen fundoplication  4034  . Total knee arthroplasty      right  . Joint replacement  9/10    rt total  . Foot surgery  2013    x2  . Abdominal hysterectomy  1996    RSO for menorrhagia.  . Coronary angioplasty with stent placement  06/21/2013     Current Outpatient Prescriptions  Medication Sig Dispense  Refill  . aspirin EC 81 MG EC tablet Take 1 tablet (81 mg total) by mouth daily.    Marland Kitchen doxycycline (VIBRAMYCIN) 100 MG capsule Prn dental    . ezetimibe (ZETIA) 10 MG tablet Take 0.5 tablets (5 mg total) by mouth daily. 90 tablet 2  . lisinopril (PRINIVIL,ZESTRIL) 5 MG tablet Take 1 tablet (5 mg total) by mouth daily. 90 tablet 1  . Multiple Vitamin (MULTIVITAMIN WITH MINERALS) TABS Take 1 tablet by mouth daily.     . nitroGLYCERIN (NITROSTAT) 0.4 MG SL tablet Place 1 tablet (0.4 mg total) under the tongue every 5 (five) minutes x 3 doses as needed for chest pain. 25 tablet 12  . NON FORMULARY FINITI ( ANTI-AGING) EVERY OTHER DAY    . omeprazole  (PRILOSEC) 40 MG capsule Take 40 mg by mouth 2 (two) times daily.    . pravastatin (PRAVACHOL) 10 MG tablet Take 1 tablet (10 mg total) by mouth every evening. 90 tablet 3   No current facility-administered medications for this visit.    Allergies:   Aspirin; Nsaids; Pravastatin sodium; and Statins    Social History:  The patient  reports that she has never smoked. She has never used smokeless tobacco. She reports that she drinks alcohol. She reports that she does not use illicit drugs.   Family History:  The patient's family history includes Asthma in her maternal grandmother; Cancer in her maternal grandfather and mother; Emphysema in her maternal grandmother; Heart attack in her father; Heart disease in her father; Hepatitis C in her mother; Panic disorder in her daughter. There is no history of Stroke.    ROS:   Please see the history of present illness.   Review of Systems  Constitution: Positive for malaise/fatigue and weight gain.       + 5 lbs  Cardiovascular: Positive for chest pain.  Respiratory: Positive for shortness of breath and snoring.   Endocrine: Positive for cold intolerance.  Musculoskeletal: Positive for myalgias.  Gastrointestinal: Positive for constipation.  Neurological: Positive for dizziness.  All other systems reviewed and are negative.     PHYSICAL EXAM: VS:  BP 130/68 mmHg  Pulse 74  Ht 5\' 1"  (1.549 m)  Wt 128 lb (58.06 kg)  BMI 24.20 kg/m2  LMP 11/25/1994    Wt Readings from Last 3 Encounters:  01/25/15 128 lb (58.06 kg)  12/29/14 127 lb 3.2 oz (57.698 kg)  08/29/14 125 lb (56.7 kg)     GEN: Well nourished, well developed, in no acute distress HEENT: normal Neck: no JVD, no masses Cardiac:  Normal S1/S2, RRR; no murmur, no rubs or gallops, no edema  Respiratory:  clear to auscultation bilaterally, no wheezing, rhonchi or rales. GI: soft, nontender, nondistended, + BS MS: no deformity or atrophy+ tend of trapezius mm bilat; no cervical  spine tend to palp Skin: warm and dry  Neuro:  CNs II-XII intact, Strength and sensation are intact Psych: Normal affect   EKG:  EKG is ordered today.  It demonstrates:   NSR, HR 74, normal axis, inc RBBB, no change from prior tracing   Recent Labs: 01/24/2015: ALT 20; BUN 17; Creatinine 0.78; Hemoglobin 12.1; Platelets 160; Potassium 4.0; Sodium 142    Lipid Panel    Component Value Date/Time   CHOL 153 07/29/2014 0926   CHOL 244* 08/22/2013 1617   TRIG 112 07/29/2014 0926   TRIG 171* 08/22/2013 1617   HDL 49 07/29/2014 0926   HDL 45 08/22/2013 1617  CHOLHDL 5.4 08/22/2013 1617   VLDL 34 08/22/2013 1617   LDLCALC 82 07/29/2014 0926   LDLCALC 165* 08/22/2013 1617      ASSESSMENT AND PLAN:  1.  Chest Pain:  Her symptoms are quite atypical for ischemia. However, they are concerning. She has noted some dyspnea with exertion as well as fatigue. Her symptoms are not reminiscent of her previous angina. Her ECG is unchanged. She's had 2 trips to the emergency room with unremarkable cardiac enzymes. Given that she has some discomfort in her right and left shoulder, I would consider evaluating her cervical spine as well.    -  Arrange ETT-Myoview to rule out ischemia.    -  If Myoview low risk, follow-up with primary care to evaluate other causes of her arm discomfort. 2.  CAD:  Continue aspirin, ACE inhibitor, statin. Proceed with Myoview as noted. 3.  HTN:  Controlled. 4.  Hyperlipidemia:  Continue statin, Zetia. 5.  Fatigue:  Obtain TSH today.   Current medicines are reviewed at length with the patient today.  The patient does not have concerns regarding medicines.  The following changes have been made:  As above.   Labs/ tests ordered today include:   Orders Placed This Encounter  Procedures  . TSH  . Myocardial Perfusion Imaging  . EKG 12-Lead     Disposition:   FU with me or Dr. [name]  in 2 weeks.   Signed, Versie Starks, MHS 01/25/2015 11:46 AM      [facility] [place], [place], [place]  [address] Phone: [phone]; Fax: [phone]

## 2015-01-27 ENCOUNTER — Ambulatory Visit: Payer: BLUE CROSS/BLUE SHIELD | Admitting: Physician Assistant

## 2015-01-31 ENCOUNTER — Ambulatory Visit (HOSPITAL_COMMUNITY): Payer: BLUE CROSS/BLUE SHIELD | Attending: Cardiology | Admitting: Radiology

## 2015-01-31 DIAGNOSIS — I2510 Atherosclerotic heart disease of native coronary artery without angina pectoris: Secondary | ICD-10-CM

## 2015-01-31 DIAGNOSIS — R5382 Chronic fatigue, unspecified: Secondary | ICD-10-CM

## 2015-01-31 DIAGNOSIS — R079 Chest pain, unspecified: Secondary | ICD-10-CM | POA: Insufficient documentation

## 2015-01-31 MED ORDER — TECHNETIUM TC 99M SESTAMIBI GENERIC - CARDIOLITE
11.0000 | Freq: Once | INTRAVENOUS | Status: AC | PRN
  Administered 2015-01-31: 11 via INTRAVENOUS

## 2015-01-31 MED ORDER — TECHNETIUM TC 99M SESTAMIBI GENERIC - CARDIOLITE
33.0000 | Freq: Once | INTRAVENOUS | Status: AC | PRN
  Administered 2015-01-31: 33 via INTRAVENOUS

## 2015-01-31 NOTE — Progress Notes (Signed)
[facility] [address] [place], [place] [address] [phone]    Cardiology Nuclear Med Study  Kristine [name] is a 74 y.o. female     MRN : [id number]     DOB: [birth year]  Procedure Date: 01/31/2015  Nuclear Med Background Indication for Stress Test:  Evaluation for Ischemia History:  CAD-Stents Cardiac Risk Factors: Carotid Disease, Hypertension and Lipids  Symptoms:  Chest Pain, DOE and Fatigue   Nuclear Pre-Procedure Caffeine/Decaff Intake:  8:30pm NPO After: 7:30pm   Lungs:  clear O2 Sat: 95% on room air. IV 0.9% NS with Angio Cath:  22g  IV Site: R Wrist  IV Started by:  Matilde Haymaker, RN  Chest Size (in):  36 Cup Size: B  Height: 5\' 1"  (1.549 m)  Weight:  128 lb (58.06 kg)  BMI:  Body mass index is 24.2 kg/(m^2). Tech Comments:  n/a    Nuclear Med Study 1 or 2 day study: 1 day  Stress Test Type:  Stress  Reading MD: n/a  Order Authorizing Provider:  Christena Deem and Nicki Re[facility]  Resting Radionuclide: Technetium 25m Sestamibi  Resting Radionuclide Dose: 11.0 mCi   Stress Radionuclide:  Technetium 58m Sestamibi  Stress Radionuclide Dose: 33.0 mCi           Stress Protocol Rest HR: 67 Stress HR: 137  Rest BP: 116/70 Stress BP: 162/55  Exercise Time (min): 7:00 METS: 8.50   Predicted Max HR: 149 bpm % Max HR: 91.95 bpm Rate Pressure Product: 22194   Dose of Adenosine (mg):  n/a Dose of Lexiscan: n/a mg  Dose of Atropine (mg): n/a Dose of Dobutamine: n/a mcg/kg/min (at max HR)  Stress Test Technologist: Perrin Maltese, EMT-P  Nuclear Technologist:  Earl Many, CNMT     Rest Procedure:  Myocardial perfusion imaging was performed at rest 45 minutes following the intravenous administration of Technetium 59m Sestamibi. Rest ECG: NSR with incomplete RBBB with T wave inversions in V1 and V2  Stress Procedure:  The patient exercised on the treadmill utilizing the Bruce Protocol for 7:00 minutes. The patient stopped due  to fatigue and denied any chest pain.  Technetium 70m Sestamibi was injected at peak exercise and myocardial perfusion imaging was performed after a brief delay. Stress ECG: There are scattered PVCs.  QPS Raw Data Images:  Mild diaphragmatic attenuation.  Normal left ventricular size. Stress Images:  There is decreased uptake in the inferolateral and anterolateral walls. Rest Images:  There is decreased uptake in the inferolateral and anterolateral  walls. Subtraction (SDS):  No evidence of ischemia. Transient Ischemic Dilatation (Normal <1.22):  0.91 Lung/Heart Ratio (Normal <0.45):  0.30  Quantitative Gated Spect Images QGS EDV:  82 ml QGS ESV:  34 ml  Impression Exercise Capacity:  Good exercise capacity. BP Response:  Hypotensive blood pressure response. Clinical Symptoms:  fatigue ECG Impression:  No significant ST segment change suggestive of ischemia. Comparison with Prior Nuclear Study: No previous nuclear study performed  Overall Impression:  Intermediate risk stress nuclear study with a small in size, mild in intesity fixed defect in the inferolateral and anterolateral myocardiam most likely secondary to diaphragmatic attenuation artifact.  There is no evidence of ischemia.  The patient's BP did drop from 152/75mmHg to 137/50mmHg during the exercise but initial BP in recovery was 162/55mmHg.  This BP drop may be tech error since BP in immediate recovery was increased from resting BP prior to starting the test. The patient had fatigue but  no EKG changes of ischemia.  LV Ejection Fraction: 59%.  LV Wall Motion:  NL LV Function; NL Wall Motion  Signed: Fransico Him, MD [facility] 01/31/2015

## 2015-02-01 ENCOUNTER — Ambulatory Visit: Payer: BLUE CROSS/BLUE SHIELD | Admitting: Physician Assistant

## 2015-02-03 ENCOUNTER — Telehealth: Payer: Self-pay | Admitting: Cardiovascular Disease

## 2015-02-03 NOTE — Telephone Encounter (Signed)
Call patient and reviewed symptoms with her over the telephone. Reviewed her stress test results as well as office notes. She is primarily complaining of right arm pain. This is worse with her arms over her head. She also has fatigue and exertional dyspnea. I suspect her symptoms are primarily noncardiac. Advised her to call Dr. Everlene Farrier for appropriate evaluation and follow-up. She does have an appointment scheduled with Richardson Dopp in our office next week.  [name] 02/03/2015 12:59 PM

## 2015-02-03 NOTE — Telephone Encounter (Signed)
I called pt with myoview results when pt states Dr. Burt Knack called her earlier this morning. I apologized for my call. Pt said no problem that she enjoyed speaking with me today. She states we are "on the ball" making sure pt's know their results.

## 2015-02-03 NOTE — Telephone Encounter (Signed)
Notified of stress test results.  She states she is still very concerned that she is still having extreme fatigue and pain in (R) arm.  She would like an explanation of why she still is feeling bad.  Scheduled to see Margaret Pyle on 3/17 but would like some explanation before then.  Advised that Dr. Burt Knack not in office today but will forward message to him for recommendations.

## 2015-02-03 NOTE — Telephone Encounter (Signed)
New Msg       Pt returning call about myo test results.   Please call

## 2015-02-07 ENCOUNTER — Encounter (HOSPITAL_COMMUNITY): Payer: BLUE CROSS/BLUE SHIELD

## 2015-02-09 ENCOUNTER — Ambulatory Visit: Payer: BLUE CROSS/BLUE SHIELD | Admitting: Physician Assistant

## 2015-02-27 ENCOUNTER — Ambulatory Visit (INDEPENDENT_AMBULATORY_CARE_PROVIDER_SITE_OTHER): Payer: BLUE CROSS/BLUE SHIELD | Admitting: Family Medicine

## 2015-02-27 VITALS — BP 130/90 | HR 83 | Temp 98.1°F | Resp 16 | Ht 61.0 in | Wt 127.0 lb

## 2015-02-27 DIAGNOSIS — J040 Acute laryngitis: Secondary | ICD-10-CM | POA: Diagnosis not present

## 2015-02-27 MED ORDER — METHYLPREDNISOLONE 4 MG PO TABS: 8.0000 mg | ORAL_TABLET | Freq: Every day | ORAL | Status: DC

## 2015-02-27 NOTE — Patient Instructions (Signed)
Laryngitis °At the top of your windpipe is your voice box. It is the source of your voice. Inside your voice box are 2 bands of muscles called vocal cords. When you breathe, your vocal cords are relaxed and open so that air can get into the lungs. When you decide to say something, these cords come together and vibrate. The sound from these vibrations goes into your throat and comes out through your mouth as sound.  °Laryngitis is an inflammation of the vocal cords that causes hoarseness, cough, loss of voice, sore throat, and dry throat. Laryngitis can be temporary (acute) or long-term (chronic). Most cases of acute laryngitis improve with time.Chronic laryngitis lasts for more than 3 weeks. °CAUSES °Laryngitis can often be related to excessive smoking, talking, or yelling, as well as inhalation of toxic fumes and allergies. Acute laryngitis is usually caused by a viral infection, vocal strain, measles or mumps, or bacterial infections. Chronic laryngitis is usually caused by vocal cord strain, vocal cord injury, postnasal drip, growths on the vocal cords, or acid reflux. °SYMPTOMS  °· Cough. °· Sore throat. °· Dry throat. °RISK FACTORS °· Respiratory infections. °· Exposure to irritating substances, such as cigarette smoke, excessive amounts of alcohol, stomach acids, and workplace chemicals. °· Voice trauma, such as vocal cord injury from shouting or speaking too loud. °DIAGNOSIS  °Your cargiver will perform a physical exam. During the physical exam, your caregiver will examine your throat. The most common sign of laryngitis is hoarseness. Laryngoscopy may be necessary to confirm the diagnosis of this condition. This procedure allows your caregiver to look into the larynx. °HOME CARE INSTRUCTIONS °· Drink enough fluids to keep your urine clear or pale yellow. °· Rest until you no longer have symptoms or as directed by your caregiver. °· Breathe in moist air. °· Take all medicine as directed by your  caregiver. °· Do not smoke. °· Talk as little as possible (this includes whispering). °· Write on paper instead of talking until your voice is back to normal. °· Follow up with your caregiver if your condition has not improved after 10 days. °SEEK MEDICAL CARE IF:  °· You have trouble breathing. °· You cough up blood. °· You have persistent fever. °· You have increasing pain. °· You have difficulty swallowing. °MAKE SURE YOU: °· Understand these instructions. °· Will watch your condition. °· Will get help right away if you are not doing well or get worse. °Document Released: 11/11/2005 Document Revised: 02/03/2012 Document Reviewed: 01/17/2011 °Exit[employer]® Patient Information ©2015 [employer]. This information is not intended to replace advice given to you by your health care provider. Make sure you discuss any questions you have with your health care provider. ° °

## 2015-02-27 NOTE — Progress Notes (Signed)
° °  Subjective:    Patient ID: Kristine [name], female    DOB: [birth year], 74 y.o.   MRN: [id number] This chart was scribed for Robyn Haber, MD by [name], Medical Scribe. This patient was seen in Room 3 and the patient's care was started at 1:52 PM.   HPI HPI Comments: Kristine [name] is a 74 y.o. female who presents to the [facility] complaining of gradual onset URI symptoms that started 2 days ago but worsened yesterday. Patient reports having hoarse voice and dry cough. She thinks she may have laryngitis. She has tried saltwater gargles for her symptoms. Patient denies fever. She has had the flu vaccine this season.  Review of Systems  Constitutional: Negative for fever.  HENT: Positive for voice change.   Respiratory: Positive for cough.        Objective:   Physical Exam CONSTITUTIONAL: Well developed/well nourished HEAD: Normocephalic/atraumatic EYES: EOM/PERRL ENMT: Mucous membranes moist. Erythema to right side of throat.Marland Kitchen  Hoarse voice NECK: supple no meningeal signs SPINE: entire spine nontender CV: S1/S2 noted, no murmurs/rubs/gallops noted LUNGS: Lungs are clear to auscultation bilaterally, no apparent distress ABDOMEN: soft, nontender, no rebound or guarding GU: no cva tenderness NEURO: Pt is awake/alert, moves all extremitiesx4 EXTREMITIES: pulses normal, full ROM SKIN: warm, color normal PSYCH: no abnormalities of mood noted     Assessment & Plan:   This chart was scribed in my presence and reviewed by me personally.    ICD-9-CM ICD-10-CM   1. Laryngitis 464.00 J04.0 methylPREDNISolone (MEDROL) 4 MG tablet     Signed, Robyn Haber, MD

## 2015-03-02 ENCOUNTER — Ambulatory Visit (INDEPENDENT_AMBULATORY_CARE_PROVIDER_SITE_OTHER): Payer: BLUE CROSS/BLUE SHIELD | Admitting: Family Medicine

## 2015-03-02 VITALS — BP 120/80 | HR 83 | Temp 97.7°F | Resp 16 | Ht 61.0 in | Wt 126.0 lb

## 2015-03-02 DIAGNOSIS — J040 Acute laryngitis: Secondary | ICD-10-CM | POA: Diagnosis not present

## 2015-03-02 DIAGNOSIS — J22 Unspecified acute lower respiratory infection: Secondary | ICD-10-CM

## 2015-03-02 DIAGNOSIS — J988 Other specified respiratory disorders: Secondary | ICD-10-CM | POA: Diagnosis not present

## 2015-03-02 MED ORDER — AZITHROMYCIN 250 MG PO TABS: ORAL_TABLET | ORAL | Status: DC

## 2015-03-02 MED ORDER — BENZONATATE 100 MG PO CAPS: 100.0000 mg | ORAL_CAPSULE | Freq: Three times a day (TID) | ORAL | Status: DC | PRN

## 2015-03-02 NOTE — Progress Notes (Signed)
Subjective:    Patient ID: Kristine [name], female    DOB: Jan 30, 1941, 74 y.o.   MRN: [id number] This chart was scribed for Kristine Forts, MD by Kristine [name], Medical Scribe. This patient was seen in Room 4 and the patient's care was started at 5:09 PM.   03/02/2015  Laryngitis and Cough   HPI  HPI Comments: Kristine [name] is a 74 y.o. female who presents to the [facility] complaining of gradual onset worsening cough that started 4 days ago. The cough started becoming productive of thick green/yellow mucus yesterday.  Patient also reports having voice change, rhinorrhea, congestion, and chest tightness. She was seen by Dr. Joseph [name] 3 days ago for the same symptoms; she was diagnosed with laryngitis and had been given prednisone. She has been taking the prednisone, but without relief. The cough does not keep her up at night. Patient denies fever, chills, diaphoresis, SOB, wheeze, sore throat, ear pain, swollen glands, nausea, vomiting, diarrhea, sneezing, itchy eyes, itchy nose, fatigue, and sinus pressure. She states she has not been excessively talking. She was last on antibiotics 1 month ago following a knee replacement surgery.  Patient took the week off from work to put her house on the market.  Denies allergic rhinitis issues.  Review of Systems  Constitutional: Negative for fever, chills, diaphoresis and fatigue.  HENT: Positive for congestion, rhinorrhea and voice change. Negative for ear pain, sinus pressure, sneezing, sore throat and trouble swallowing.   Eyes: Negative for itching.  Respiratory: Positive for cough and chest tightness. Negative for shortness of breath and wheezing.   Gastrointestinal: Negative for nausea, vomiting, abdominal pain and diarrhea.  Neurological: Negative for headaches.  Hematological: Negative for adenopathy.    Past Medical History  Diagnosis Date  . GERD (gastroesophageal reflux disease)   . Barrett's esophagus     Stage B  .  Cystocele   . Rectocele     mild  . Hypertension   . Diverticulitis   . Arthritis   . Raynaud disease   . CREST variant of scleroderma   . Osteopenia 09/2012    T score -2.4 FRAX 13%/2.7%  per 10/15/2012 discussion plan repeat in 2 years  . Anemia   . STEMI (ST elevation myocardial infarction) 06/21/2013  . CAD (coronary artery disease)   . Hyperlipidemia    Past Surgical History  Procedure Laterality Date  . Tonsillectomy  1950  . Tubal ligation  1980  . Nissen fundoplication  0300  . Total knee arthroplasty      right  . Joint replacement  9/10    rt total  . Foot surgery  2013    x2  . Abdominal hysterectomy  1996    RSO for menorrhagia.  . Coronary angioplasty with stent placement  06/21/2013   Allergies  Allergen Reactions  . Aspirin Other (See Comments)    Pt is prone to bleeding, ok with low dose coated aspirin  . Nsaids Other (See Comments)    Pt is prone to bleeding  . Pravastatin Sodium     Other reaction(s): Other (See Comments) elevated LFT'S, tolerates 1/2 dose of zetia and 1/2 dose of pravastatin  . Statins Other (See Comments)    elevated LFT'S, tolerates 1/2 dose of zetia and 1/2 dose of pravastatin   Current Outpatient Prescriptions  Medication Sig Dispense Refill  . aspirin EC 81 MG EC tablet Take 1 tablet (81 mg total) by mouth daily.    Marland Kitchen ezetimibe (ZETIA)  10 MG tablet Take 0.5 tablets (5 mg total) by mouth daily. 90 tablet 2  . lisinopril (PRINIVIL,ZESTRIL) 5 MG tablet Take 1 tablet (5 mg total) by mouth daily. 90 tablet 1  . methylPREDNISolone (MEDROL) 4 MG tablet Take 2 tablets (8 mg total) by mouth daily. 10 tablet 0  . Multiple Vitamin (MULTIVITAMIN WITH MINERALS) TABS Take 1 tablet by mouth daily.     . nitroGLYCERIN (NITROSTAT) 0.4 MG SL tablet Place 1 tablet (0.4 mg total) under the tongue every 5 (five) minutes x 3 doses as needed for chest pain. 25 tablet 12  . omeprazole (PRILOSEC) 40 MG capsule Take 40 mg by mouth 2 (two) times daily.      . pravastatin (PRAVACHOL) 10 MG tablet Take 1 tablet (10 mg total) by mouth every evening. 90 tablet 3  . azithromycin (ZITHROMAX) 250 MG tablet Two tablets daily x 1 day, then one tablet daily x 4 days 6 tablet 0  . benzonatate (TESSALON) 100 MG capsule Take 1-2 capsules (100-200 mg total) by mouth 3 (three) times daily as needed for cough. 40 capsule 0  . NON FORMULARY FINITI ( ANTI-AGING) EVERY OTHER DAY     No current facility-administered medications for this visit.       Objective:    BP 120/80 mmHg  Pulse 83  Temp(Src) 97.7 F (36.5 C) (Oral)  Resp 16  Ht 5\' 1"  (1.549 m)  Wt 126 lb (57.153 kg)  BMI 23.82 kg/m2  SpO2 96%  LMP 11/25/1994 Physical Exam  Constitutional: She is oriented to person, place, and time. She appears well-developed and well-nourished. No distress.  HENT:  Head: Normocephalic and atraumatic.  Right Ear: Tympanic membrane and external ear normal.  Left Ear: Tympanic membrane and external ear normal.  Nose: Nose normal.  Mouth/Throat: Oropharynx is clear and moist. No oropharyngeal exudate.  Eyes: Conjunctivae and EOM are normal. Pupils are equal, round, and reactive to light.  Neck: Neck supple.  Cardiovascular: Normal rate, regular rhythm and normal heart sounds.   No murmur heard. Pulmonary/Chest: Effort normal and breath sounds normal. No respiratory distress. She has no wheezes. She has no rales.  CTAB  Musculoskeletal: She exhibits no edema.  Lymphadenopathy:    She has no cervical adenopathy.  Neurological: She is alert and oriented to person, place, and time. No cranial nerve deficit.  Skin: Skin is warm and dry. No rash noted. She is not diaphoretic.  Psychiatric: She has a normal mood and affect. Her behavior is normal.  Vitals reviewed.  Results for orders placed or performed in visit on 01/25/15  TSH  Result Value Ref Range   TSH 1.28 0.35 - 4.50 uIU/mL       Assessment & Plan:   1. Laryngitis   2. Lower respiratory  infection     -Persistent; now with thick sputum production. -Treat with Zpack, Tessalon Perles.   -Emphasized importance of voice rest.   -Complete steroid dose pack.    Meds ordered this encounter  Medications  . azithromycin (ZITHROMAX) 250 MG tablet    Sig: Two tablets daily x 1 day, then one tablet daily x 4 days    Dispense:  6 tablet    Refill:  0  . benzonatate (TESSALON) 100 MG capsule    Sig: Take 1-2 capsules (100-200 mg total) by mouth 3 (three) times daily as needed for cough.    Dispense:  40 capsule    Refill:  0    No Follow-up on  file.   I personally performed the services described in this documentation, which was scribed in my presence. The recorded information has been reviewed and considered.  Micharl Helmes Elayne Guerin, M.D. [facility] [address] [place], [place]  [address] [phone] phone [phone] fax

## 2015-03-02 NOTE — Patient Instructions (Signed)
Laryngitis °At the top of your windpipe is your voice box. It is the source of your voice. Inside your voice box are 2 bands of muscles called vocal cords. When you breathe, your vocal cords are relaxed and open so that air can get into the lungs. When you decide to say something, these cords come together and vibrate. The sound from these vibrations goes into your throat and comes out through your mouth as sound.  °Laryngitis is an inflammation of the vocal cords that causes hoarseness, cough, loss of voice, sore throat, and dry throat. Laryngitis can be temporary (acute) or long-term (chronic). Most cases of acute laryngitis improve with time.Chronic laryngitis lasts for more than 3 weeks. °CAUSES °Laryngitis can often be related to excessive smoking, talking, or yelling, as well as inhalation of toxic fumes and allergies. Acute laryngitis is usually caused by a viral infection, vocal strain, measles or mumps, or bacterial infections. Chronic laryngitis is usually caused by vocal cord strain, vocal cord injury, postnasal drip, growths on the vocal cords, or acid reflux. °SYMPTOMS  °· Cough. °· Sore throat. °· Dry throat. °RISK FACTORS °· Respiratory infections. °· Exposure to irritating substances, such as cigarette smoke, excessive amounts of alcohol, stomach acids, and workplace chemicals. °· Voice trauma, such as vocal cord injury from shouting or speaking too loud. °DIAGNOSIS  °Your cargiver will perform a physical exam. During the physical exam, your caregiver will examine your throat. The most common sign of laryngitis is hoarseness. Laryngoscopy may be necessary to confirm the diagnosis of this condition. This procedure allows your caregiver to look into the larynx. °HOME CARE INSTRUCTIONS °· Drink enough fluids to keep your urine clear or pale yellow. °· Rest until you no longer have symptoms or as directed by your caregiver. °· Breathe in moist air. °· Take all medicine as directed by your  caregiver. °· Do not smoke. °· Talk as little as possible (this includes whispering). °· Write on paper instead of talking until your voice is back to normal. °· Follow up with your caregiver if your condition has not improved after 10 days. °SEEK MEDICAL CARE IF:  °· You have trouble breathing. °· You cough up blood. °· You have persistent fever. °· You have increasing pain. °· You have difficulty swallowing. °MAKE SURE YOU: °· Understand these instructions. °· Will watch your condition. °· Will get help right away if you are not doing well or get worse. °Document Released: 11/11/2005 Document Revised: 02/03/2012 Document Reviewed: 01/17/2011 °Exit[employer]® Patient Information ©2015 [employer]. This information is not intended to replace advice given to you by your health care provider. Make sure you discuss any questions you have with your health care provider. ° °

## 2015-03-07 ENCOUNTER — Encounter: Payer: Self-pay | Admitting: Emergency Medicine

## 2015-03-07 ENCOUNTER — Ambulatory Visit (INDEPENDENT_AMBULATORY_CARE_PROVIDER_SITE_OTHER): Payer: BLUE CROSS/BLUE SHIELD | Admitting: Emergency Medicine

## 2015-03-07 ENCOUNTER — Ambulatory Visit (INDEPENDENT_AMBULATORY_CARE_PROVIDER_SITE_OTHER): Payer: BLUE CROSS/BLUE SHIELD

## 2015-03-07 VITALS — BP 106/69 | HR 101 | Temp 98.6°F | Resp 16 | Ht 61.0 in | Wt 127.0 lb

## 2015-03-07 DIAGNOSIS — R05 Cough: Secondary | ICD-10-CM

## 2015-03-07 DIAGNOSIS — I10 Essential (primary) hypertension: Secondary | ICD-10-CM

## 2015-03-07 DIAGNOSIS — R059 Cough, unspecified: Secondary | ICD-10-CM

## 2015-03-07 MED ORDER — LOSARTAN POTASSIUM 25 MG PO TABS: ORAL_TABLET | ORAL | Status: DC

## 2015-03-07 NOTE — Progress Notes (Signed)
Subjective:  This chart was scribed for [name], MD by Donato Schultz, Medical Scribe. This patient was seen in Room 23 and the patient's care was started at 2:52 PM.   Patient ID: Kristine [name], female    DOB: May 08, 1941, 74 y.o.   MRN: [id number]  HPI HPI Comments: Kristine [name] is a 74 y.o. female who presents to the [facility] complaining of a constant dry cough and hoarseness that started 10 days ago.  She saw Dr. Joseph Art on 4/4 and was told that she had a viral infection.  She was treated with a course of prednisone but states that she has not experienced any relief to her symptoms even after finishing the prednisone.  She states that she finally got her voice back yesterday.  In the morning her cough is productive of thick, green sputum.  She saw Dr. Tamala Julian on 4/7 and was put on Tessalon Pearls and Z-Pak with no relief to her symptoms.  She did not have a chest xray at that visit.    Past Medical History  Diagnosis Date  . GERD (gastroesophageal reflux disease)   . Barrett's esophagus     Stage B  . Cystocele   . Rectocele     mild  . Hypertension   . Diverticulitis   . Arthritis   . Raynaud disease   . CREST variant of scleroderma   . Osteopenia 09/2012    T score -2.4 FRAX 13%/2.7%  per 10/15/2012 discussion [name] repeat in 2 years  . Anemia   . STEMI (ST elevation myocardial infarction) 06/21/2013  . CAD (coronary artery disease)   . Hyperlipidemia    Past Surgical History  Procedure Laterality Date  . Tonsillectomy  1950  . Tubal ligation  1980  . Nissen fundoplication  0626  . Total knee arthroplasty      right  . Joint replacement  9/10    rt total  . Foot surgery  2013    x2  . Abdominal hysterectomy  1996    RSO for menorrhagia.  . Coronary angioplasty with stent placement  06/21/2013   Family History  Problem Relation Age of Onset  . Cancer Mother     Pancreatic Cancer  . Hepatitis C Mother   . Heart disease Father     congestive  heart failure  . Panic disorder Daughter   . Asthma Maternal Grandmother   . Emphysema Maternal Grandmother   . Cancer Maternal Grandfather     lung  . Heart attack Father   . Stroke Neg Hx    History   Social History  . Marital Status: Divorced    Spouse Name: N/A  . Number of Children: N/A  . Years of Education: N/A   Occupational History  . Not on file.   Social History Main Topics  . Smoking status: Never Smoker   . Smokeless tobacco: Never Used  . Alcohol Use: Yes     Comment: Occasional  . Drug Use: No  . Sexual Activity: No   Other Topics Concern  . Not on file   Social History Narrative   Exercise dancing 3 x times weekly for 1-2 hours   Allergies  Allergen Reactions  . Aspirin Other (See Comments)    Pt is prone to bleeding, ok with low dose coated aspirin  . Nsaids Other (See Comments)    Pt is prone to bleeding  . Pravastatin Sodium     Other reaction(s): Other (  See Comments) elevated LFT'S, tolerates 1/2 dose of zetia and 1/2 dose of pravastatin  . Statins Other (See Comments)    elevated LFT'S, tolerates 1/2 dose of zetia and 1/2 dose of pravastatin    Review of Systems  HENT: Positive for voice change.   Respiratory: Positive for cough.     Objective:  Physical Exam  Constitutional: She is oriented to person, place, and time. She appears well-developed and well-nourished.  HENT:  Head: Normocephalic and atraumatic.  Right Ear: Hearing, tympanic membrane, external ear and ear canal normal.  Left Ear: Hearing, tympanic membrane, external ear and ear canal normal.  Mouth/Throat: Oropharynx is clear and moist. No oropharyngeal exudate.  Eyes: EOM are normal.  Neck: Normal range of motion. Neck supple. No thyromegaly present.  Cardiovascular: Normal rate, regular rhythm and normal heart sounds.  Exam reveals no gallop and no friction rub.   No murmur heard. Pulmonary/Chest: Effort normal and breath sounds normal. No respiratory distress. She  has no wheezes. She has no rales.  Frequent episodes of cough.  Musculoskeletal: Normal range of motion.  Lymphadenopathy:    She has no cervical adenopathy.  Neurological: She is alert and oriented to person, place, and time.  Skin: Skin is warm and dry.  Psychiatric: She has a normal mood and affect. Her behavior is normal.  Nursing note and vitals reviewed.  [facility] (PRIMARY) x-ray report read by Dr. Everlene Farrier: There is mild scoliosis. There is no infiltrate noted. Heart size is normal.   BP 106/69 mmHg  Pulse 101  Temp(Src) 98.6 F (37 C)  Resp 16  Ht 5\' 1"  (1.549 m)  Wt 127 lb (57.607 kg)  BMI 24.01 kg/m2  SpO2 97%  LMP 11/25/1994 Assessment & [name]:  Patient present with persistent cough following what sounds like a respiratory infection.  She has been treated with cough capsules, Azithromax, and Prednisone.  Of note, she is on a low dose of lisinopril.  Will check a chest xray and change lisinopril to losartan 25mg  half tablet a day.

## 2015-03-07 NOTE — Patient Instructions (Signed)
Stop your lisinopril. Start losartan 25 mg half tablet at night. Take Mucinex twice a day for cough

## 2015-03-15 ENCOUNTER — Telehealth: Payer: Self-pay

## 2015-03-15 NOTE — Telephone Encounter (Signed)
Pt dropped off FMLA ppw on 4/19 for Dr. Everlene Farrier.  Was seen on 4/17 and put out of work from 4/11-4/17. Please return to Disability department upon completion; will scan into epic and fax to [phone].

## 2015-03-27 DIAGNOSIS — Z0271 Encounter for disability determination: Secondary | ICD-10-CM

## 2015-03-27 NOTE — Telephone Encounter (Signed)
Patient called to see if her FMLA forms had been faxed. Forms were completed, signed, and faxed on 03/17/2015. Left patient a message letting her know.

## 2015-05-20 ENCOUNTER — Telehealth: Payer: Self-pay

## 2015-05-20 NOTE — Telephone Encounter (Signed)
[phone] ans serv call 6pm Call already answered by Dr.Daub

## 2015-05-20 NOTE — Telephone Encounter (Signed)
Pt is out of town and having high blood pressure issues and is wanting to know if she could take an extra blood pressure pill or what she should do

## 2015-05-25 ENCOUNTER — Telehealth: Payer: Self-pay

## 2015-05-25 NOTE — Telephone Encounter (Signed)
I would suggest she come in this weekend and we will look at her medications and make adjustments. I am here Saturday Sunday and Monday

## 2015-05-25 NOTE — Telephone Encounter (Signed)
Pt is still having elevated bp issues and the last reading 155-75

## 2015-05-25 NOTE — Telephone Encounter (Signed)
Any suggestions for pt?

## 2015-05-26 NOTE — Telephone Encounter (Signed)
Spoke with pt, advised to RTC. Pt understood.

## 2015-05-27 ENCOUNTER — Ambulatory Visit (INDEPENDENT_AMBULATORY_CARE_PROVIDER_SITE_OTHER): Payer: BLUE CROSS/BLUE SHIELD | Admitting: Emergency Medicine

## 2015-05-27 VITALS — BP 138/60 | HR 86 | Temp 98.5°F | Resp 18 | Ht 60.0 in | Wt 131.4 lb

## 2015-05-27 DIAGNOSIS — I10 Essential (primary) hypertension: Secondary | ICD-10-CM

## 2015-05-27 LAB — BASIC METABOLIC PANEL WITH GFR
BUN: 14 mg/dL (ref 6–23)
CO2: 28 mEq/L (ref 19–32)
Calcium: 9 mg/dL (ref 8.4–10.5)
Chloride: 105 mEq/L (ref 96–112)
Creat: 0.74 mg/dL (ref 0.50–1.10)
GFR, EST NON AFRICAN AMERICAN: 81 mL/min
Glucose, Bld: 90 mg/dL (ref 70–99)
Potassium: 4.1 mEq/L (ref 3.5–5.3)
Sodium: 141 mEq/L (ref 135–145)

## 2015-05-27 MED ORDER — ALPRAZOLAM 0.25 MG PO TABS: ORAL_TABLET | ORAL | Status: DC

## 2015-05-27 MED ORDER — LOSARTAN POTASSIUM 50 MG PO TABS: ORAL_TABLET | ORAL | Status: DC

## 2015-05-27 NOTE — Progress Notes (Signed)
° °  Subjective:  This chart was scribed for Nena Jordan, MD by [facility], medical scribe at [facility].The patient was seen in exam room 12 and the patient's care was started at 1:56 PM.   Patient ID: Kristine [name], female    DOB: 1941-07-03, 74 y.o.   MRN: [id number] Chief Complaint  Patient presents with   Follow-up    Recheck BP    HPI HPI Comments: Kristine [name] is a 74 y.o. female who presents to [facility] for a follow up for a blood pressure recheck. Her blood pressure has fluctuated. Pt is more stressed recently. Father recently passed away, and she has been having relationship troubles with his wife. Blood pressure recheck is 156/70 on the left, 150/72 on the right. Taking two doses of losartan this past week instead of her usual half pill.   BP Readings from Last 3 Encounters:  05/27/15 138/60  03/07/15 106/69  03/02/15 120/80   Review of Systems    Objective:  BP 138/60 mmHg   Pulse 86   Temp(Src) 98.5 F (36.9 C) (Oral)   Resp 18   Ht 5' (1.524 m)   Wt 131 lb 6 oz (59.591 kg)   BMI 25.66 kg/m2   SpO2 98%   LMP 11/25/1994 Physical Exam  Vitals reviewed. CONSTITUTIONAL: Well developed/well nourished HEAD: Normocephalic/atraumatic EYES: EOMI/PERRL ENMT: Mucous membranes moist NECK: supple no meningeal signs SPINE/BACK:entire spine nontender CV: S1/S2 noted, no murmurs/rubs/gallops noted LUNGS: Lungs are clear to auscultation bilaterally, no apparent distress ABDOMEN: soft, nontender, no rebound or guarding, bowel sounds noted throughout abdomen GU:no cva tenderness NEURO: Pt is awake/alert/appropriate, moves all extremitiesx4. No facial droop.   EXTREMITIES: pulses normal/equal, full ROM SKIN: warm, color normal. PSYCH: no abnormalities of mood noted, alert and oriented to situation.    Meds ordered this encounter  Medications   omeprazole (PRILOSEC) 20 MG capsule    Sig: Take 20 mg by mouth 2 (two) times daily.   losartan  (COZAAR) 50 MG tablet    Sig: Take one half tablet daily    Dispense:  30 tablet    Refill:  11   ALPRAZolam (XANAX) 0.25 MG tablet    Sig: Take 1/2-1 tablet as needed for anxiety    Dispense:  20 tablet    Refill:  0    Assessment & [name]:  I changed her losartan to the 50 mg tablet to take one half tablet daily but she may need to take a full tablet daily. I gave her alprazolam to have for her anxiety related to situations regarding her father's death.I personally performed the services described in this documentation, which was scribed in my presence. The recorded information has been reviewed and is accurate.  Nena Jordan, MD

## 2015-05-27 NOTE — Progress Notes (Signed)
   Subjective:  This chart was scribed for Nena Jordan, MD by [facility], medical scribe at [facility].The patient was seen in exam room 12 and the patient's care was started at 1:56 PM.   Patient ID: Kristine [name], female    DOB: 1941/01/15, 74 y.o.   MRN: [id number] Chief Complaint  Patient presents with  . Follow-up    Recheck BP    HPI HPI Comments: Kristine [name] is a 74 y.o. female who presents to [facility] for a follow up for a blood pressure recheck. Her blood pressure has fluctuated. Pt is more stressed recently. Father recently passed away, and she has been having relationship troubles with his wife. Blood pressure recheck is 156/70 on the left, 150/72 on the right. Taking two doses of losartan this past week instead of her usual half pill.   BP Readings from Last 3 Encounters:  05/27/15 138/60  03/07/15 106/69  03/02/15 120/80   Review of Systems    Objective:  BP 138/60 mmHg  Pulse 86  Temp(Src) 98.5 F (36.9 C) (Oral)  Resp 18  Ht 5' (1.524 m)  Wt 131 lb 6 oz (59.591 kg)  BMI 25.66 kg/m2  SpO2 98%  LMP 11/25/1994 Physical Exam  Vitals reviewed. CONSTITUTIONAL: Well developed/well nourished HEAD: Normocephalic/atraumatic EYES: EOMI/PERRL ENMT: Mucous membranes moist NECK: supple no meningeal signs SPINE/BACK:entire spine nontender CV: S1/S2 noted, no murmurs/rubs/gallops noted LUNGS: Lungs are clear to auscultation bilaterally, no apparent distress ABDOMEN: soft, nontender, no rebound or guarding, bowel sounds noted throughout abdomen GU:no cva tenderness NEURO: Pt is awake/alert/appropriate, moves all extremitiesx4. No facial droop.   EXTREMITIES: pulses normal/equal, full ROM SKIN: warm, color normal. PSYCH: no abnormalities of mood noted, alert and oriented to situation.    Meds ordered this encounter  Medications  . omeprazole (PRILOSEC) 20 MG capsule    Sig: Take 20 mg by mouth 2 (two) times daily.  Marland Kitchen losartan  (COZAAR) 50 MG tablet    Sig: Take one half tablet daily    Dispense:  30 tablet    Refill:  11  . ALPRAZolam (XANAX) 0.25 MG tablet    Sig: Take 1/2-1 tablet as needed for anxiety    Dispense:  20 tablet    Refill:  0    Assessment & [name]:  I changed her losartan to the 50 mg tablet to take one half tablet daily but she may need to take a full tablet daily. I gave her alprazolam to have for her anxiety related to situations regarding her father's death.I personally performed the services described in this documentation, which was scribed in my presence. The recorded information has been reviewed and is accurate.  Nena Jordan, MD

## 2015-05-30 ENCOUNTER — Encounter: Payer: Self-pay | Admitting: Gynecology

## 2015-05-30 ENCOUNTER — Ambulatory Visit (INDEPENDENT_AMBULATORY_CARE_PROVIDER_SITE_OTHER): Payer: BLUE CROSS/BLUE SHIELD | Admitting: Gynecology

## 2015-05-30 VITALS — BP 120/78

## 2015-05-30 DIAGNOSIS — N8111 Cystocele, midline: Secondary | ICD-10-CM | POA: Diagnosis not present

## 2015-05-30 DIAGNOSIS — N939 Abnormal uterine and vaginal bleeding, unspecified: Secondary | ICD-10-CM | POA: Diagnosis not present

## 2015-05-30 NOTE — Patient Instructions (Signed)
Office will call you when the pessary becomes available for fitting.

## 2015-05-30 NOTE — Progress Notes (Signed)
Kristine [name] 1941/07/30 [id number]        74 y.o.  G1P1 Presents complaining of several episodes of vaginal staining when on her feet and lifting heavy objects.  Status post TAH in the past with large cystocele. Had been using pessary before but stopped using it because it felt uncomfortable. She feels that the staining is from the rubbing of her cystocele against her pad.  Past medical history,surgical history, problem list, medications, allergies, family history and social history were all reviewed and documented in the EPIC chart.  Directed ROS with pertinent positives and negatives documented in the history of present illness/assessment and plan.  Exam: Kim assistant Filed Vitals:   05/30/15 1603  BP: 120/78   General appearance:  Normal Abdomen soft nontender without masses guarding rebound Pelvic external BUS vagina with large cystocele protruding from the introitus. Several areas of irritation noted but no active bleeding. Bimanual without masses or tenderness. Rectal exam normal.  Assessment/Plan:  74 y.o. G1P1 with vaginal irritation from her cystocele protruding. Not actively bleeding now. Reviewed options again with her and she wants to retry the pessary but a smaller size. I used a Gellhorn 2-1/2 previously.  Gellhorn 2-1/4 fitted today. Patient ambulated afterwards and it stayed in place and was comfortable for her. We will go ahead and order and she will return when this becomes available for placement.   Anastasio Auerbach MD, 4:30 PM 05/30/2015

## 2015-06-06 ENCOUNTER — Ambulatory Visit: Payer: BLUE CROSS/BLUE SHIELD | Admitting: Gynecology

## 2015-06-23 ENCOUNTER — Encounter: Payer: Self-pay | Admitting: Emergency Medicine

## 2015-06-27 ENCOUNTER — Encounter: Payer: Self-pay | Admitting: Gynecology

## 2015-06-27 ENCOUNTER — Ambulatory Visit (INDEPENDENT_AMBULATORY_CARE_PROVIDER_SITE_OTHER): Payer: BLUE CROSS/BLUE SHIELD | Admitting: Gynecology

## 2015-06-27 VITALS — BP 120/76

## 2015-06-27 DIAGNOSIS — N8111 Cystocele, midline: Secondary | ICD-10-CM

## 2015-06-27 NOTE — Progress Notes (Signed)
Kristine [name] 08/26/1941 [id number]        74 y.o.  G1P1 Presents for pessary placement. Has large cystocele and previously had a Gellhorn 2-1/2 pessary. This was causing pressure and discomfort. We refitted her with a 2-1/4 and presents now for placement.  Past medical history,surgical history, problem list, medications, allergies, family history and social history were all reviewed and documented in the EPIC chart.  Directed ROS with pertinent positives and negatives documented in the history of present illness/assessment and plan.  Exam: Kim assistant Filed Vitals:   06/27/15 0854  BP: 120/76   General appearance:  Normal External BUS vagina with second-degree cystocele. Generalized atrophic changes. Bimanual without masses or tenderness. Gellhorn 2-1/4 pessary was placed easily. Patient ambulated and strained with pessary remaining in place. Patient said it felt better than the prior pessary.  Assessment/Plan:  74 y.o. G1P1 with large cystocele for pessary placement. Will follow up in one month for reexamination. Sooner if any issues.    Anastasio Auerbach MD, 9:05 AM 06/27/2015

## 2015-06-27 NOTE — Patient Instructions (Signed)
Follow up in one month for reexamination 

## 2015-06-28 ENCOUNTER — Telehealth: Payer: Self-pay | Admitting: *Deleted

## 2015-06-28 NOTE — Telephone Encounter (Signed)
This pt was just in yesterday and had a pessary inserted. She called today and cancelled her 34m reck appt as the pessary fell out yesterday when she went to the bathroom.  but she doesn't want another pessary.

## 2015-07-24 ENCOUNTER — Ambulatory Visit (INDEPENDENT_AMBULATORY_CARE_PROVIDER_SITE_OTHER): Payer: BLUE CROSS/BLUE SHIELD | Admitting: Family Medicine

## 2015-07-24 VITALS — BP 128/60 | HR 77 | Temp 98.4°F | Resp 16 | Ht 61.0 in | Wt 133.0 lb

## 2015-07-24 DIAGNOSIS — H9202 Otalgia, left ear: Secondary | ICD-10-CM | POA: Diagnosis not present

## 2015-07-24 DIAGNOSIS — K0510 Chronic gingivitis, plaque induced: Secondary | ICD-10-CM

## 2015-07-24 MED ORDER — AMOXICILLIN 875 MG PO TABS: 875.0000 mg | ORAL_TABLET | Freq: Two times a day (BID) | ORAL | Status: DC

## 2015-07-24 NOTE — Patient Instructions (Signed)
Let me know if you are not improving in the next couple days

## 2015-07-24 NOTE — Progress Notes (Signed)
° °  Subjective:    Patient ID: Kristine [name], female    DOB: 06-10-1941, 74 y.o.   MRN: [id number] This chart was scribed for Kristine Haber, MD by [name], Medical Scribe. This patient was seen in Room 2 and the patient's care was started at 7:12 PM.   HPI HPI Comments: Kristine [name] is a 74 y.o. female who presents to the [facility] complaining of gradual onset sore throat that started yesterday. She reports having associated intermittent left otalgia when swallowing and cold sores. Patient denies abdominal pain, cough, nausea, and difficulty swallowing. She is able to tolerate penicillin.  Review of Systems  HENT: Positive for ear pain, mouth sores and sore throat. Negative for trouble swallowing.   Respiratory: Negative for cough.   Gastrointestinal: Negative for nausea.       Objective:   Physical Exam CONSTITUTIONAL: Well developed/well nourished HEAD: Normocephalic/atraumatic EYES: EOM/PERRL ENMT: Mucous membranes moist, reddened at gingiva of tooth #16 NECK: supple no meningeal signs SPINE: entire spine nontender CV: S1/S2 noted, no murmurs/rubs/gallops noted LUNGS: Lungs are clear to auscultation bilaterally, no apparent distress ABDOMEN: soft, nontender, no rebound or guarding GU: no cva tenderness NEURO: Pt is awake/alert, moves all extremitiesx4 EXTREMITIES: pulses normal, full ROM SKIN: warm, color normal PSYCH: no abnormalities of mood noted        Assessment & Plan:   This chart was scribed in my presence and reviewed by me personally.    ICD-9-CM ICD-10-CM   1. Gingivitis 523.10 K05.10 amoxicillin (AMOXIL) 875 MG tablet  2. Otalgia of left ear 388.70 H92.02 amoxicillin (AMOXIL) 875 MG tablet     Signed, Kristine Haber, MD

## 2015-07-25 ENCOUNTER — Ambulatory Visit: Payer: BLUE CROSS/BLUE SHIELD | Admitting: Gynecology

## 2015-08-29 ENCOUNTER — Encounter: Payer: Self-pay | Admitting: Emergency Medicine

## 2015-09-26 ENCOUNTER — Ambulatory Visit (INDEPENDENT_AMBULATORY_CARE_PROVIDER_SITE_OTHER): Payer: BLUE CROSS/BLUE SHIELD | Admitting: Family Medicine

## 2015-09-26 ENCOUNTER — Ambulatory Visit (INDEPENDENT_AMBULATORY_CARE_PROVIDER_SITE_OTHER): Payer: BLUE CROSS/BLUE SHIELD

## 2015-09-26 VITALS — BP 138/80 | HR 70 | Temp 97.9°F | Resp 16 | Ht 61.0 in | Wt 131.2 lb

## 2015-09-26 DIAGNOSIS — R6883 Chills (without fever): Secondary | ICD-10-CM

## 2015-09-26 DIAGNOSIS — R5383 Other fatigue: Secondary | ICD-10-CM | POA: Diagnosis not present

## 2015-09-26 DIAGNOSIS — R195 Other fecal abnormalities: Secondary | ICD-10-CM

## 2015-09-26 DIAGNOSIS — R1031 Right lower quadrant pain: Secondary | ICD-10-CM | POA: Diagnosis not present

## 2015-09-26 DIAGNOSIS — R140 Abdominal distension (gaseous): Secondary | ICD-10-CM | POA: Diagnosis not present

## 2015-09-26 DIAGNOSIS — R110 Nausea: Secondary | ICD-10-CM | POA: Diagnosis not present

## 2015-09-26 DIAGNOSIS — N3000 Acute cystitis without hematuria: Secondary | ICD-10-CM

## 2015-09-26 LAB — POCT CBC
Granulocyte percent: 59.1 %G (ref 37–80)
HEMATOCRIT: 36.1 % — AB (ref 37.7–47.9)
HEMOGLOBIN: 11.7 g/dL — AB (ref 12.2–16.2)
Lymph, poc: 2.6 (ref 0.6–3.4)
MCH, POC: 25.6 pg — AB (ref 27–31.2)
MCHC: 32.3 g/dL (ref 31.8–35.4)
MCV: 79.4 fL — AB (ref 80–97)
MID (cbc): 0.4 (ref 0–0.9)
MPV: 7.2 fL (ref 0–99.8)
POC GRANULOCYTE: 4.3 (ref 2–6.9)
POC LYMPH PERCENT: 35.5 %L (ref 10–50)
POC MID %: 5.4 % (ref 0–12)
Platelet Count, POC: 172 10*3/uL (ref 142–424)
RBC: 4.54 M/uL (ref 4.04–5.48)
RDW, POC: 16.6 %
WBC: 7.2 10*3/uL (ref 4.6–10.2)

## 2015-09-26 LAB — POCT URINALYSIS DIP (MANUAL ENTRY)
BILIRUBIN UA: NEGATIVE
BILIRUBIN UA: NEGATIVE
Blood, UA: NEGATIVE
GLUCOSE UA: NEGATIVE
Nitrite, UA: NEGATIVE
Protein Ur, POC: NEGATIVE
SPEC GRAV UA: 1.02
Urobilinogen, UA: 0.2
pH, UA: 6

## 2015-09-26 LAB — POC MICROSCOPIC URINALYSIS (UMFC): MUCUS RE: ABSENT

## 2015-09-26 MED ORDER — CEPHALEXIN 500 MG PO CAPS: 500.0000 mg | ORAL_CAPSULE | Freq: Two times a day (BID) | ORAL | Status: DC

## 2015-09-26 NOTE — Progress Notes (Addendum)
Subjective:  This chart was scribed for Merri Ray, MD by [name], Medical Scribe. This patient was seen in room 9 and the patient's care was started 12:50 PM.   Patient ID: Kristine [name], female    DOB: [birth year], 74 y.o.   MRN: [id number]  HPI Kristine [name] is a 74 y.o. female She had the flu shot on Thursday at work. Right now, she's had chills and fatigue for 3 days now. She has her heat on 78 yesterday and still needed heavy blanket. She feels nausea and frequent BM that was really loose. She feels bloated for last 2 days. She denies medication for this. She's had diverticulitis in the past (1 year ago). Last colonoscopy Nov 2013. 2 polyps noted with diverticulosis. She denies diarrhea, fever, cough, vomiting, abd pain, frequency. No history of SBL. She denies drinking water often, as she prefers water and tea.   She mentioned that she went to a restaurant 2 nights ago and had a salad with walnuts with some poppy seed dressing. The reason for this mention is because there is a possible discussion with the correlation between nuts and seeds leading to diverticulitis.   Her boss was sick 2 weeks ago with congestion and productive coughing. They had a meeting in a small room. Starting the next day, her boss was out for 4 days.   Patient Active Problem List   Diagnosis Date Noted  . CAD (coronary artery disease), native coronary artery 06/27/2013  . Hyperlipidemia 06/27/2013  . Precordial pain 06/26/2013  . ST elevation myocardial infarction (STEMI) of anterolateral wall, initial episode of care ([place]) 06/24/2013  . Hypertension   . Diverticulitis   . GERD (gastroesophageal reflux disease)   . Barrett's esophagus   . Cystocele   . Rectocele    Past Medical History  Diagnosis Date  . GERD (gastroesophageal reflux disease)   . Barrett's esophagus     Stage B  . Cystocele   . Rectocele     mild  . Hypertension   . Diverticulitis   . Arthritis   . Raynaud disease   . CREST  variant of scleroderma ([place])   . Osteopenia 09/2012    T score -2.4 FRAX 13%/2.7%  per 10/15/2012 discussion [name] repeat in 2 years  . Anemia   . STEMI (ST elevation myocardial infarction) ([place]) 06/21/2013  . CAD (coronary artery disease)   . Hyperlipidemia    Past Surgical History  Procedure Laterality Date  . Tonsillectomy  1950  . Tubal ligation  1980  . Nissen fundoplication  5797  . Total knee arthroplasty      right  . Joint replacement  9/10    rt total  . Foot surgery  2013    x2  . Abdominal hysterectomy  1996    RSO for menorrhagia.  . Coronary angioplasty with stent placement  06/21/2013   Allergies  Allergen Reactions  . Aspirin Other (See Comments)    Pt is prone to bleeding, ok with low dose coated aspirin  . Nsaids Other (See Comments)    Pt is prone to bleeding  . Pravastatin Sodium     Other reaction(s): Other (See Comments) elevated LFT'S, tolerates 1/2 dose of zetia and 1/2 dose of pravastatin  . Statins Other (See Comments)    elevated LFT'S, tolerates 1/2 dose of zetia and 1/2 dose of pravastatin   Prior to Admission medications   Medication Sig Start Date End Date Taking? Authorizing Provider  ALPRAZolam Duanne Moron)  0.25 MG tablet Take 1/2-1 tablet as needed for anxiety 05/27/15   Darlyne Russian, MD  amoxicillin (AMOXIL) 875 MG tablet Take 1 tablet (875 mg total) by mouth 2 (two) times daily. 07/24/15   Robyn Haber, MD  aspirin EC 81 MG EC tablet Take 1 tablet (81 mg total) by mouth daily. 06/24/13   Rhonda G Barrett, PA-C  ezetimibe (ZETIA) 10 MG tablet Take 0.5 tablets (5 mg total) by mouth daily. 09/26/14   [name], MD  losartan (COZAAR) 50 MG tablet Take one half tablet daily 05/27/15   Darlyne Russian, MD  Multiple Vitamin (MULTIVITAMIN WITH MINERALS) TABS Take 1 tablet by mouth daily.     Historical Provider, MD  nitroGLYCERIN (NITROSTAT) 0.4 MG SL tablet Place 1 tablet (0.4 mg total) under the tongue every 5 (five) minutes x 3 doses as needed for  chest pain. 06/24/13   Rhonda G Barrett, PA-C  omeprazole (PRILOSEC) 20 MG capsule Take 20 mg by mouth 2 (two) times daily. 05/03/15 05/02/16  Historical Provider, MD  pravastatin (PRAVACHOL) 10 MG tablet Take 1 tablet (10 mg total) by mouth every evening. 09/27/14   [name], MD   Social History   Social History  . Marital Status: Divorced    Spouse Name: N/A  . Number of Children: N/A  . Years of Education: N/A   Occupational History  . Not on file.   Social History Main Topics  . Smoking status: Never Smoker   . Smokeless tobacco: Never Used  . Alcohol Use: 0.0 oz/week    0 Standard drinks or equivalent per week     Comment: Occasional  . Drug Use: No  . Sexual Activity: No   Other Topics Concern  . Not on file   Social History Narrative   Exercise dancing 3 x times weekly for 1-2 hours    Review of Systems  Constitutional: Positive for chills and fatigue. Negative for fever.  Respiratory: Negative for cough.   Gastrointestinal: Positive for nausea and abdominal distention. Negative for vomiting, abdominal pain, diarrhea, constipation and blood in stool.  Genitourinary: Negative for frequency.       Objective:   Physical Exam  Constitutional: She is oriented to person, place, and time. She appears well-developed and well-nourished. No distress.  HENT:  Head: Normocephalic and atraumatic.  Right Ear: Hearing, tympanic membrane, external ear and ear canal normal.  Left Ear: Hearing, tympanic membrane, external ear and ear canal normal.  Nose: Nose normal.  Mouth/Throat: Oropharynx is clear and moist. No oropharyngeal exudate.  Eyes: Conjunctivae and EOM are normal. Pupils are equal, round, and reactive to light.  Cardiovascular: Normal rate, regular rhythm, normal heart sounds and intact distal pulses.   No murmur heard. Pulmonary/Chest: Effort normal and breath sounds normal. No respiratory distress. She has no wheezes. She has no rhonchi.  Abdominal: There is  tenderness in the right lower quadrant and left lower quadrant.  More tender RLQ than LLQ  Neurological: She is alert and oriented to person, place, and time.  Skin: Skin is warm and dry. No rash noted.  Psychiatric: She has a normal mood and affect. Her behavior is normal.  Vitals reviewed.   Filed Vitals:   09/26/15 1158  BP: 138/80  Pulse: 70  Temp: 97.9 F (36.6 C)  TempSrc: Oral  Resp: 16  Height: 5\' 1"  (1.549 m)  Weight: 131 lb 3.2 oz (59.512 kg)  SpO2: 98%   [facility] reading (PRIMARY) by Dr. Carlota Raspberry :  abd xray: marked scoliosis with degenerative lumbar changes, no bowel gasses and without obstruction; possible faint scarring left lower lobe, no acute findings   Results for orders placed or performed in visit on 09/26/15  POCT CBC  Result Value Ref Range   WBC 7.2 4.6 - 10.2 K/uL   Lymph, poc 2.6 0.6 - 3.4   POC LYMPH PERCENT 35.5 10 - 50 %L   MID (cbc) 0.4 0 - 0.9   POC MID % 5.4 0 - 12 %M   POC Granulocyte 4.3 2 - 6.9   Granulocyte percent 59.1 37 - 80 %G   RBC 4.54 4.04 - 5.48 M/uL   Hemoglobin 11.7 (A) 12.2 - 16.2 g/dL   HCT, POC 36.1 (A) 37.7 - 47.9 %   MCV 79.4 (A) 80 - 97 fL   MCH, POC 25.6 (A) 27 - 31.2 pg   MCHC 32.3 31.8 - 35.4 g/dL   RDW, POC 16.6 %   Platelet Count, POC 172 142 - 424 K/uL   MPV 7.2 0 - 99.8 fL  POCT urinalysis dipstick  Result Value Ref Range   Color, UA yellow yellow   Clarity, UA cloudy (A) clear   Glucose, UA negative negative   Bilirubin, UA negative negative   Ketones, POC UA negative negative   Spec Grav, UA 1.020    Blood, UA negative negative   pH, UA 6.0    Protein Ur, POC negative negative   Urobilinogen, UA 0.2    Nitrite, UA Negative Negative   Leukocytes, UA moderate (2+) (A) Negative  POCT Microscopic Urinalysis ([facility])  Result Value Ref Range   WBC,UR,HPF,POC Moderate (A) None WBC/hpf   RBC,UR,HPF,POC None None RBC/hpf   Bacteria Few (A) None, Too numerous to count   Mucus Absent Absent   Epithelial Cells, UR Per  Microscopy Few (A) None, Too numerous to count cells/hpf        Assessment & [name]:   Kristine [name] is a 74 y.o. female Chills - [name]: POCT CBC, DG Abd Acute W/Chest  Nausea without vomiting - [name]: POCT CBC, DG Abd Acute W/Chest  Abdominal bloating - [name]: POCT CBC, DG Abd Acute W/Chest  Other fatigue - [name]: POCT CBC, DG Abd Acute W/Chest  Loose stools - [name]: POCT CBC, DG Abd Acute W/Chest  RLQ abdominal pain - [name]: POCT CBC, POCT urinalysis dipstick, POCT Microscopic Urinalysis ([facility])  Acute cystitis without hematuria - [name]: cephALEXin (KEFLEX) 500 MG capsule, Urine culture  2-3 day history of fatigue, chills without fever, slight abdominal discomfort/bloating, and few loose stools. Possible viral illness/viral gastroenteritis, less likely diverticulitis with reassuring CBC (or early), possible UTI with pyuria. Decided to try initial treatment for UTI with Keflex 500 mg twice a day, check urine culture, but if not improving overnight, [name] on CT scan tomorrow for possible diverticulitis. Overnight ER/return to clinic precautions discussed.  Meds ordered this encounter  Medications  . cephALEXin (KEFLEX) 500 MG capsule    Sig: Take 1 capsule (500 mg total) by mouth 2 (two) times daily.    Dispense:  20 capsule    Refill:  0   Patient Instructions  Your blood count, and x-ray overall looked okay today. Based on your exam and what I see here in the office, it is less likely diverticulitis. You may have another viral illness that can cause some nausea vomiting and diarrhea. You do have some possible signs of infection on your urine, so I would start with treatment for a  urinary tract infection with the Keflex twice per day. We will check a urine culture to make sure does look like an infection and to make sure this is the correct antibiotic use. If your pain is not improving into tomorrow, return to see me anytime after 2 PM, and I can order a CAT scan to make sure this is not  diverticulitis.   Return to the clinic or go to the nearest emergency room if any of your symptoms worsen or new symptoms occur.  Abdominal Pain, Adult Many things can cause abdominal pain. Usually, abdominal pain is not caused by a disease and will improve without treatment. It can often be observed and treated at home. Your health care provider will do a physical exam and possibly order blood tests and X-rays to help determine the seriousness of your pain. However, in many cases, more time must pass before a clear cause of the pain can be found. Before that point, your health care provider may not know if you need more testing or further treatment. HOME CARE INSTRUCTIONS Monitor your abdominal pain for any changes. The following actions may help to alleviate any discomfort you are experiencing:  Only take over-the-counter or prescription medicines as directed by your health care provider.  Do not take laxatives unless directed to do so by your health care provider.  Try a clear liquid diet (broth, tea, or water) as directed by your health care provider. Slowly move to a bland diet as tolerated. SEEK MEDICAL CARE IF:  You have unexplained abdominal pain.  You have abdominal pain associated with nausea or diarrhea.  You have pain when you urinate or have a bowel movement.  You experience abdominal pain that wakes you in the night.  You have abdominal pain that is worsened or improved by eating food.  You have abdominal pain that is worsened with eating fatty foods.  You have a fever. SEEK IMMEDIATE MEDICAL CARE IF:  Your pain does not go away within 2 hours.  You keep throwing up (vomiting).  Your pain is felt only in portions of the abdomen, such as the right side or the left lower portion of the abdomen.  You pass bloody or black tarry stools. MAKE SURE YOU:  Understand these instructions.  Will watch your condition.  Will get help right away if you are not doing well or  get worse.   This information is not intended to replace advice given to you by your health care provider. Make sure you discuss any questions you have with your health care provider.   Document Released: 08/21/2005 Document Revised: 08/02/2015 Document Reviewed: 07/21/2013 Elsevier Interactive Patient Education 2016 Else[employer] Inc[employer]  Urinary Tract Infection Urinary tract infections (UTIs) can develop anywhere along your urinary tract. Your urinary tract is your body's drainage system for removing wastes and extra water. Your urinary tract includes two kidneys, two ureters, a bladder, and a urethra. Your kidneys are a pair of bean-shaped organs. Each kidney is about the size of your fist. They are located below your ribs, one on each side of your spine. CAUSES Infections are caused by microbes, which are microscopic organisms, including fungi, viruses, and bacteria. These organisms are so small that they can only be seen through a microscope. Bacteria are the microbes that most commonly cause UTIs. SYMPTOMS  Symptoms of UTIs may vary by age and gender of the patient and by the location of the infection. Symptoms in young women typically include a frequent and intense  urge to urinate and a painful, burning feeling in the bladder or urethra during urination. Older women and men are more likely to be tired, shaky, and weak and have muscle aches and abdominal pain. A fever may mean the infection is in your kidneys. Other symptoms of a kidney infection include pain in your back or sides below the ribs, nausea, and vomiting. DIAGNOSIS To diagnose a UTI, your caregiver will ask you about your symptoms. Your caregiver will also ask you to provide a urine sample. The urine sample will be tested for bacteria and white blood cells. White blood cells are made by your body to help fight infection. TREATMENT  Typically, UTIs can be treated with medication. Because most UTIs are caused by a bacterial infection, they  usually can be treated with the use of antibiotics. The choice of antibiotic and length of treatment depend on your symptoms and the type of bacteria causing your infection. HOME CARE INSTRUCTIONS  If you were prescribed antibiotics, take them exactly as your caregiver instructs you. Finish the medication even if you feel better after you have only taken some of the medication.  Drink enough water and fluids to keep your urine clear or pale yellow.  Avoid caffeine, tea, and carbonated beverages. They tend to irritate your bladder.  Empty your bladder often. Avoid holding urine for long periods of time.  Empty your bladder before and after sexual intercourse.  After a bowel movement, women should cleanse from front to back. Use each tissue only once. SEEK MEDICAL CARE IF:   You have back pain.  You develop a fever.  Your symptoms do not begin to resolve within 3 days. SEEK IMMEDIATE MEDICAL CARE IF:   You have severe back pain or lower abdominal pain.  You develop chills.  You have nausea or vomiting.  You have continued burning or discomfort with urination. MAKE SURE YOU:   Understand these instructions.  Will watch your condition.  Will get help right away if you are not doing well or get worse.   This information is not intended to replace advice given to you by your health care provider. Make sure you discuss any questions you have with your health care provider.   Document Released: 08/21/2005 Document Revised: 08/02/2015 Document Reviewed: 12/20/2011 Elsevier Interactive Patient Education [employer].        By signing my name below, I, [name], attest that this documentation has been prepared under the direction and in the presence of Merri Ray, MD. Electronically Signed: [name], [place]. 09/26/2015 , 12:50 PM .  I personally performed the services described in this documentation, which was scribed in my presence. The recorded  information has been reviewed and considered, and addended by me as needed.

## 2015-09-26 NOTE — Patient Instructions (Signed)
Your blood count, and x-ray overall looked okay today. Based on your exam and what I see here in the office, it is less likely diverticulitis. You may have another viral illness that can cause some nausea vomiting and diarrhea. You do have some possible signs of infection on your urine, so I would start with treatment for a urinary tract infection with the Keflex twice per day. We will check a urine culture to make sure does look like an infection and to make sure this is the correct antibiotic use. If your pain is not improving into tomorrow, return to see me anytime after 2 PM, and I can order a CAT scan to make sure this is not diverticulitis.   Return to the clinic or go to the nearest emergency room if any of your symptoms worsen or new symptoms occur.  Abdominal Pain, Adult Many things can cause abdominal pain. Usually, abdominal pain is not caused by a disease and will improve without treatment. It can often be observed and treated at home. Your health care provider will do a physical exam and possibly order blood tests and X-rays to help determine the seriousness of your pain. However, in many cases, more time must pass before a clear cause of the pain can be found. Before that point, your health care provider may not know if you need more testing or further treatment. HOME CARE INSTRUCTIONS Monitor your abdominal pain for any changes. The following actions may help to alleviate any discomfort you are experiencing:  Only take over-the-counter or prescription medicines as directed by your health care provider.  Do not take laxatives unless directed to do so by your health care provider.  Try a clear liquid diet (broth, tea, or water) as directed by your health care provider. Slowly move to a bland diet as tolerated. SEEK MEDICAL CARE IF:  You have unexplained abdominal pain.  You have abdominal pain associated with nausea or diarrhea.  You have pain when you urinate or have a bowel  movement.  You experience abdominal pain that wakes you in the night.  You have abdominal pain that is worsened or improved by eating food.  You have abdominal pain that is worsened with eating fatty foods.  You have a fever. SEEK IMMEDIATE MEDICAL CARE IF:  Your pain does not go away within 2 hours.  You keep throwing up (vomiting).  Your pain is felt only in portions of the abdomen, such as the right side or the left lower portion of the abdomen.  You pass bloody or black tarry stools. MAKE SURE YOU:  Understand these instructions.  Will watch your condition.  Will get help right away if you are not doing well or get worse.   This information is not intended to replace advice given to you by your health care provider. Make sure you discuss any questions you have with your health care provider.   Document Released: 08/21/2005 Document Revised: 08/02/2015 Document Reviewed: 07/21/2013 Elsevier Interactive Patient Education 2016 Elsevier Inc.  Urinary Tract Infection Urinary tract infections (UTIs) can develop anywhere along your urinary tract. Your urinary tract is your body's drainage system for removing wastes and extra water. Your urinary tract includes two kidneys, two ureters, a bladder, and a urethra. Your kidneys are a pair of bean-shaped organs. Each kidney is about the size of your fist. They are located below your ribs, one on each side of your spine. CAUSES Infections are caused by microbes, which are microscopic organisms, including  fungi, viruses, and bacteria. These organisms are so small that they can only be seen through a microscope. Bacteria are the microbes that most commonly cause UTIs. SYMPTOMS  Symptoms of UTIs may vary by age and gender of the patient and by the location of the infection. Symptoms in young women typically include a frequent and intense urge to urinate and a painful, burning feeling in the bladder or urethra during urination. Older women and  men are more likely to be tired, shaky, and weak and have muscle aches and abdominal pain. A fever may mean the infection is in your kidneys. Other symptoms of a kidney infection include pain in your back or sides below the ribs, nausea, and vomiting. DIAGNOSIS To diagnose a UTI, your caregiver will ask you about your symptoms. Your caregiver will also ask you to provide a urine sample. The urine sample will be tested for bacteria and white blood cells. White blood cells are made by your body to help fight infection. TREATMENT  Typically, UTIs can be treated with medication. Because most UTIs are caused by a bacterial infection, they usually can be treated with the use of antibiotics. The choice of antibiotic and length of treatment depend on your symptoms and the type of bacteria causing your infection. HOME CARE INSTRUCTIONS  If you were prescribed antibiotics, take them exactly as your caregiver instructs you. Finish the medication even if you feel better after you have only taken some of the medication.  Drink enough water and fluids to keep your urine clear or pale yellow.  Avoid caffeine, tea, and carbonated beverages. They tend to irritate your bladder.  Empty your bladder often. Avoid holding urine for long periods of time.  Empty your bladder before and after sexual intercourse.  After a bowel movement, women should cleanse from front to back. Use each tissue only once. SEEK MEDICAL CARE IF:   You have back pain.  You develop a fever.  Your symptoms do not begin to resolve within 3 days. SEEK IMMEDIATE MEDICAL CARE IF:   You have severe back pain or lower abdominal pain.  You develop chills.  You have nausea or vomiting.  You have continued burning or discomfort with urination. MAKE SURE YOU:   Understand these instructions.  Will watch your condition.  Will get help right away if you are not doing well or get worse.   This information is not intended to replace  advice given to you by your health care provider. Make sure you discuss any questions you have with your health care provider.   Document Released: 08/21/2005 Document Revised: 08/02/2015 Document Reviewed: 12/20/2011 Elsevier Interactive Patient Education [employer].

## 2015-09-28 LAB — URINE CULTURE
COLONY COUNT: NO GROWTH
ORGANISM ID, BACTERIA: NO GROWTH

## 2015-10-04 DIAGNOSIS — R768 Other specified abnormal immunological findings in serum: Secondary | ICD-10-CM | POA: Insufficient documentation

## 2015-10-05 LAB — HM MAMMOGRAPHY

## 2015-10-08 ENCOUNTER — Inpatient Hospital Stay (HOSPITAL_COMMUNITY)
Admission: EM | Admit: 2015-10-08 | Discharge: 2015-10-12 | DRG: 392 | Disposition: A | Discharge status: Home / Self Care | Payer: BLUE CROSS/BLUE SHIELD | Attending: Internal Medicine | Admitting: Internal Medicine

## 2015-10-08 ENCOUNTER — Emergency Department (HOSPITAL_COMMUNITY): Payer: BLUE CROSS/BLUE SHIELD

## 2015-10-08 ENCOUNTER — Ambulatory Visit (INDEPENDENT_AMBULATORY_CARE_PROVIDER_SITE_OTHER): Payer: BLUE CROSS/BLUE SHIELD | Admitting: Emergency Medicine

## 2015-10-08 ENCOUNTER — Encounter (HOSPITAL_COMMUNITY): Payer: Self-pay | Admitting: Emergency Medicine

## 2015-10-08 VITALS — BP 174/79 | HR 75 | Temp 98.0°F | Resp 18

## 2015-10-08 DIAGNOSIS — Z90710 Acquired absence of both cervix and uterus: Secondary | ICD-10-CM | POA: Diagnosis not present

## 2015-10-08 DIAGNOSIS — E785 Hyperlipidemia, unspecified: Secondary | ICD-10-CM | POA: Diagnosis present

## 2015-10-08 DIAGNOSIS — I2510 Atherosclerotic heart disease of native coronary artery without angina pectoris: Secondary | ICD-10-CM | POA: Diagnosis present

## 2015-10-08 DIAGNOSIS — R1084 Generalized abdominal pain: Secondary | ICD-10-CM | POA: Diagnosis not present

## 2015-10-08 DIAGNOSIS — Z7982 Long term (current) use of aspirin: Secondary | ICD-10-CM

## 2015-10-08 DIAGNOSIS — Z79899 Other long term (current) drug therapy: Secondary | ICD-10-CM

## 2015-10-08 DIAGNOSIS — Z886 Allergy status to analgesic agent status: Secondary | ICD-10-CM

## 2015-10-08 DIAGNOSIS — E872 Acidosis: Secondary | ICD-10-CM | POA: Diagnosis present

## 2015-10-08 DIAGNOSIS — Z825 Family history of asthma and other chronic lower respiratory diseases: Secondary | ICD-10-CM

## 2015-10-08 DIAGNOSIS — K2270 Barrett's esophagus without dysplasia: Secondary | ICD-10-CM | POA: Diagnosis present

## 2015-10-08 DIAGNOSIS — M8580 Other specified disorders of bone density and structure, unspecified site: Secondary | ICD-10-CM | POA: Diagnosis present

## 2015-10-08 DIAGNOSIS — K219 Gastro-esophageal reflux disease without esophagitis: Secondary | ICD-10-CM | POA: Diagnosis present

## 2015-10-08 DIAGNOSIS — Z888 Allergy status to other drugs, medicaments and biological substances status: Secondary | ICD-10-CM

## 2015-10-08 DIAGNOSIS — Z800 Family history of malignant neoplasm of digestive organs: Secondary | ICD-10-CM | POA: Diagnosis not present

## 2015-10-08 DIAGNOSIS — Z8249 Family history of ischemic heart disease and other diseases of the circulatory system: Secondary | ICD-10-CM

## 2015-10-08 DIAGNOSIS — K529 Noninfective gastroenteritis and colitis, unspecified: Secondary | ICD-10-CM | POA: Diagnosis present

## 2015-10-08 DIAGNOSIS — K921 Melena: Secondary | ICD-10-CM | POA: Diagnosis present

## 2015-10-08 DIAGNOSIS — E860 Dehydration: Secondary | ICD-10-CM | POA: Diagnosis present

## 2015-10-08 DIAGNOSIS — D62 Acute posthemorrhagic anemia: Secondary | ICD-10-CM | POA: Diagnosis not present

## 2015-10-08 DIAGNOSIS — I252 Old myocardial infarction: Secondary | ICD-10-CM | POA: Diagnosis not present

## 2015-10-08 DIAGNOSIS — I7300 Raynaud's syndrome without gangrene: Secondary | ICD-10-CM | POA: Diagnosis present

## 2015-10-08 DIAGNOSIS — R140 Abdominal distension (gaseous): Secondary | ICD-10-CM

## 2015-10-08 DIAGNOSIS — M1990 Unspecified osteoarthritis, unspecified site: Secondary | ICD-10-CM | POA: Diagnosis present

## 2015-10-08 DIAGNOSIS — M341 CR(E)ST syndrome: Secondary | ICD-10-CM | POA: Diagnosis present

## 2015-10-08 DIAGNOSIS — K5792 Diverticulitis of intestine, part unspecified, without perforation or abscess without bleeding: Secondary | ICD-10-CM | POA: Diagnosis present

## 2015-10-08 DIAGNOSIS — I10 Essential (primary) hypertension: Secondary | ICD-10-CM | POA: Diagnosis present

## 2015-10-08 DIAGNOSIS — R112 Nausea with vomiting, unspecified: Secondary | ICD-10-CM | POA: Diagnosis present

## 2015-10-08 LAB — COMPREHENSIVE METABOLIC PANEL
ALT: 19 U/L (ref 14–54)
AST: 25 U/L (ref 15–41)
Albumin: 3.9 g/dL (ref 3.5–5.0)
Alkaline Phosphatase: 69 U/L (ref 38–126)
Anion gap: 9 (ref 5–15)
BUN: 22 mg/dL — ABNORMAL HIGH (ref 6–20)
CHLORIDE: 108 mmol/L (ref 101–111)
CO2: 25 mmol/L (ref 22–32)
Calcium: 9.5 mg/dL (ref 8.9–10.3)
Creatinine, Ser: 0.82 mg/dL (ref 0.44–1.00)
GFR calc Af Amer: >60 mL/min (ref >60)
Glucose, Bld: 138 mg/dL — ABNORMAL HIGH (ref 65–99)
POTASSIUM: 4.1 mmol/L (ref 3.5–5.1)
Sodium: 142 mmol/L (ref 135–145)
Total Bilirubin: 0.5 mg/dL (ref 0.3–1.2)
Total Protein: 6.7 g/dL (ref 6.5–8.1)

## 2015-10-08 LAB — CBC WITH DIFFERENTIAL/PLATELET
BASOS ABS: 0 10*3/uL (ref 0.0–0.1)
Basophils Relative: 0 %
EOS PCT: 0 %
Eosinophils Absolute: 0 10*3/uL (ref 0.0–0.7)
HCT: 39.2 % (ref 36.0–46.0)
Hemoglobin: 12.3 g/dL (ref 12.0–15.0)
Lymphocytes Relative: 7 %
Lymphs Abs: 2.3 10*3/uL (ref 0.7–4.0)
MCH: 25.5 pg — ABNORMAL LOW (ref 26.0–34.0)
MCHC: 31.4 g/dL (ref 30.0–36.0)
MCV: 81.2 fL (ref 78.0–100.0)
MONO ABS: 1.6 10*3/uL — AB (ref 0.1–1.0)
Monocytes Relative: 5 %
NEUTROS PCT: 88 %
Neutro Abs: 29 10*3/uL — ABNORMAL HIGH (ref 1.7–7.7)
PLATELETS: 228 10*3/uL (ref 150–400)
RBC: 4.83 MIL/uL (ref 3.87–5.11)
RDW: 15.4 % (ref 11.5–15.5)
WBC: 32.9 10*3/uL — AB (ref 4.0–10.5)

## 2015-10-08 LAB — C DIFFICILE QUICK SCREEN W PCR REFLEX
C DIFFICILE (CDIFF) TOXIN: NEGATIVE
C DIFFICLE (CDIFF) ANTIGEN: NEGATIVE
C Diff interpretation: NEGATIVE

## 2015-10-08 LAB — POCT CBC
Granulocyte percent: 89.1 %G — AB (ref 37–80)
HCT, POC: 40.9 % (ref 37.7–47.9)
HEMOGLOBIN: 13.4 g/dL (ref 12.2–16.2)
Lymph, poc: 2.8 (ref 0.6–3.4)
MCH, POC: 25.4 pg — AB (ref 27–31.2)
MCHC: 32.8 g/dL (ref 31.8–35.4)
MCV: 77.3 fL — AB (ref 80–97)
MID (cbc): 0.9 (ref 0–0.9)
MPV: 7.7 fL (ref 0–99.8)
POC Granulocyte: 30.5 — AB (ref 2–6.9)
POC LYMPH PERCENT: 8.2 %L — AB (ref 10–50)
POC MID %: 2.7 %M (ref 0–12)
Platelet Count, POC: 253 10*3/uL (ref 142–424)
RBC: 5.28 M/uL (ref 4.04–5.48)
RDW, POC: 16.3 %
WBC: 34.2 10*3/uL — AB (ref 4.6–10.2)

## 2015-10-08 LAB — I-STAT CG4 LACTIC ACID, ED: Lactic Acid, Venous: 2.56 mmol/L (ref 0.5–2.0)

## 2015-10-08 MED ORDER — ONDANSETRON HCL 4 MG/2ML IJ SOLN: 4.0000 mg | Freq: Four times a day (QID) | INTRAMUSCULAR | Status: DC | PRN

## 2015-10-08 MED ORDER — VANCOMYCIN 50 MG/ML ORAL SOLUTION
500.0000 mg | Freq: Four times a day (QID) | ORAL | Status: DC
  Filled 2015-10-08 (×4): qty 10

## 2015-10-08 MED ORDER — SODIUM CHLORIDE 0.9 % IV SOLN
Freq: Once | INTRAVENOUS | Status: AC
  Administered 2015-10-08: 14:00:00 via INTRAVENOUS

## 2015-10-08 MED ORDER — IOHEXOL 300 MG/ML  SOLN
25.0000 mL | Freq: Once | INTRAMUSCULAR | Status: AC | PRN
Start: 2015-10-08 — End: 2015-10-08
  Administered 2015-10-08: 25 mL via ORAL

## 2015-10-08 MED ORDER — FAMOTIDINE IN NACL 20-0.9 MG/50ML-% IV SOLN
20.0000 mg | Freq: Two times a day (BID) | INTRAVENOUS | Status: DC
  Administered 2015-10-08 – 2015-10-11 (×7): 20 mg via INTRAVENOUS
  Filled 2015-10-08 (×8): qty 50

## 2015-10-08 MED ORDER — OXYCODONE HCL 5 MG PO TABS
5.0000 mg | ORAL_TABLET | ORAL | Status: DC | PRN
  Administered 2015-10-08 – 2015-10-09 (×3): 5 mg via ORAL
  Filled 2015-10-08 (×3): qty 1

## 2015-10-08 MED ORDER — ACETAMINOPHEN 325 MG PO TABS: 650.0000 mg | ORAL_TABLET | Freq: Four times a day (QID) | ORAL | Status: DC | PRN

## 2015-10-08 MED ORDER — ACETAMINOPHEN 650 MG RE SUPP: 650.0000 mg | Freq: Four times a day (QID) | RECTAL | Status: DC | PRN

## 2015-10-08 MED ORDER — ALPRAZOLAM 0.25 MG PO TABS
0.2500 mg | ORAL_TABLET | Freq: Two times a day (BID) | ORAL | Status: DC | PRN
  Administered 2015-10-08: 0.25 mg via ORAL
  Filled 2015-10-08: qty 1

## 2015-10-08 MED ORDER — METRONIDAZOLE IN NACL 5-0.79 MG/ML-% IV SOLN
500.0000 mg | Freq: Once | INTRAVENOUS | Status: AC
  Administered 2015-10-08: 500 mg via INTRAVENOUS
  Filled 2015-10-08: qty 100

## 2015-10-08 MED ORDER — ONDANSETRON HCL 4 MG PO TABS: 4.0000 mg | ORAL_TABLET | Freq: Four times a day (QID) | ORAL | Status: DC | PRN

## 2015-10-08 MED ORDER — ENOXAPARIN SODIUM 40 MG/0.4ML ~~LOC~~ SOLN
40.0000 mg | SUBCUTANEOUS | Status: DC
  Administered 2015-10-08: 40 mg via SUBCUTANEOUS
  Filled 2015-10-08 (×2): qty 0.4

## 2015-10-08 MED ORDER — SODIUM CHLORIDE 0.9 % IV BOLUS (SEPSIS)
1000.0000 mL | Freq: Once | INTRAVENOUS | Status: AC
  Administered 2015-10-08: 1000 mL via INTRAVENOUS

## 2015-10-08 MED ORDER — PIPERACILLIN-TAZOBACTAM 3.375 G IVPB
3.3750 g | Freq: Three times a day (TID) | INTRAVENOUS | Status: DC
  Administered 2015-10-08 – 2015-10-12 (×11): 3.375 g via INTRAVENOUS
  Filled 2015-10-08 (×13): qty 50

## 2015-10-08 MED ORDER — SODIUM CHLORIDE 0.9 % IV SOLN
INTRAVENOUS | Status: DC
  Administered 2015-10-08 – 2015-10-12 (×4): via INTRAVENOUS

## 2015-10-08 MED ORDER — CIPROFLOXACIN IN D5W 400 MG/200ML IV SOLN
400.0000 mg | Freq: Once | INTRAVENOUS | Status: AC
  Administered 2015-10-08: 400 mg via INTRAVENOUS
  Filled 2015-10-08: qty 200

## 2015-10-08 MED ORDER — IOHEXOL 300 MG/ML  SOLN
100.0000 mL | Freq: Once | INTRAMUSCULAR | Status: AC | PRN
  Administered 2015-10-08: 100 mL via INTRAVENOUS

## 2015-10-08 MED ORDER — MORPHINE SULFATE (PF) 2 MG/ML IV SOLN: 1.0000 mg | INTRAVENOUS | Status: DC | PRN

## 2015-10-08 NOTE — ED Notes (Addendum)
Awake. Verbally responsive. A/O x4. Resp even and unlabored. No audible adventitious breath sounds noted. ABC's intact. IV saline lock patent and intact. Pt continues to have diarrhea.

## 2015-10-08 NOTE — H&P (Signed)
[facility] History and Physical  Kristine [name] [id number] DOB: 02/25/1941 DOA: 10/08/2015  Referring physician: EDP  PCP: Jenny Reichmann, MD   Chief Complaint: recurrent nausea, vomiting and diarrhea.   HPI: Kristine [name] is a 74 y.o. female with prior h/o diverticulosis, CREST SYNDrome, barrett's esophagus, hypertension, CAD, presents to ED, after persistent diarrhea, associated with nausea, vomiting and loss of appetite. She reports of having finished a course of keflex for diverticulitis for 10 days last Thursday and she continues to have the above symptoms. She denies hematochezia. Her lab work on arrival showed wbc count of 32.9 and c diff was negative. BUN was slightly high at 22. Lactic acid was 2.56. CT abdomen and pelvis revealed colitis int he transverse and descending colon. She was referred to medical service for admission.    Review of Systems:  Constitutional:  No weight loss, night sweats, Fevers, chills, fatigue.  HEENT:  No headaches, Difficulty swallowing,Tooth/dental problems,Sore throat,  No sneezing, itching, ear ache, nasal congestion, post nasal drip,  Cardio-vascular:  No chest pain, Orthopnea, PND, swelling in lower extremities, anasarca, dizziness, palpitations  GI:  Nausea, vomiting and diarrhea, with no apettite.  Resp:  No shortness of breath with exertion or at rest. No excess mucus, no productive cough, No non-productive cough, No coughing up of blood.No change in color of mucus.No wheezing.No chest wall deformity  Skin:  no rash or lesions.  GU:  no dysuria, change in color of urine, no urgency or frequency. No flank pain.  Musculoskeletal:  No joint pain or swelling. No decreased range of motion. No back pain.  Psych:  No change in mood or affect. No depression or anxiety. No memory loss.   Past Medical History  Diagnosis Date  . GERD (gastroesophageal reflux disease)   . Barrett's esophagus     Stage B  . Cystocele   . Rectocele    mild  . Hypertension   . Diverticulitis   . Arthritis   . Raynaud disease   . CREST variant of scleroderma ([place])   . Osteopenia 09/2012    T score -2.4 FRAX 13%/2.7%  per 10/15/2012 discussion plan repeat in 2 years  . Anemia   . STEMI (ST elevation myocardial infarction) ([place]) 06/21/2013  . CAD (coronary artery disease)   . Hyperlipidemia    Past Surgical History  Procedure Laterality Date  . Tonsillectomy  1950  . Tubal ligation  1980  . Nissen fundoplication  123456  . Total knee arthroplasty      right  . Joint replacement  9/10    rt total  . Foot surgery  2013    x2  . Abdominal hysterectomy  1996    RSO for menorrhagia.  . Coronary angioplasty with stent placement  06/21/2013   Social History:  reports that she has never smoked. She has never used smokeless tobacco. She reports that she drinks alcohol. She reports that she does not use illicit drugs.  Allergies  Allergen Reactions  . Aspirin Other (See Comments)    Pt is prone to bleeding, ok with low dose coated aspirin  . Nsaids Other (See Comments)    Pt is prone to bleeding  . Pravastatin Sodium     Other reaction(s): Other (See Comments) elevated LFT'S, tolerates 1/2 dose of zetia and 1/2 dose of pravastatin  . Statins Other (See Comments)    elevated LFT'S, tolerates 1/2 dose of zetia and 1/2 dose of pravastatin    Family History  Problem  Relation Age of Onset  . Cancer Mother     Pancreatic Cancer  . Hepatitis C Mother   . Heart disease Father     congestive heart failure  . Panic disorder Daughter   . Asthma Maternal Grandmother   . Emphysema Maternal Grandmother   . Cancer Maternal Grandfather     lung  . Heart attack Father   . Stroke Neg Hx     Prior to Admission medications   Medication Sig Start Date End Date Taking? Authorizing Provider  ALPRAZolam (XANAX) 0.25 MG tablet Take 1/2-1 tablet as needed for anxiety Patient taking differently: Take 0.25 mg by mouth 2 (two) times daily as needed  for anxiety. Take 1/2-1 tablet as needed for anxiety 05/27/15  Yes Darlyne Russian, MD  aspirin EC 81 MG EC tablet Take 1 tablet (81 mg total) by mouth daily. 06/24/13  Yes Rhonda G Barrett, PA-C  Cyanocobalamin (B-12 PO) Take 1 tablet by mouth daily.   Yes Historical Provider, MD  ezetimibe (ZETIA) 10 MG tablet Take 0.5 tablets (5 mg total) by mouth daily. 09/26/14  Yes [name], MD  KRILL OIL PO Take 1 capsule by mouth daily.   Yes Historical Provider, MD  losartan (COZAAR) 50 MG tablet Take one half tablet daily Patient taking differently: Take 25 mg by mouth daily.  05/27/15  Yes Darlyne Russian, MD  nitroGLYCERIN (NITROSTAT) 0.4 MG SL tablet Place 1 tablet (0.4 mg total) under the tongue every 5 (five) minutes x 3 doses as needed for chest pain. 06/24/13  Yes Rhonda G Barrett, PA-C  omeprazole (PRILOSEC) 20 MG capsule Take 20 mg by mouth 2 (two) times daily. 05/03/15 05/02/16 Yes Historical Provider, MD  pravastatin (PRAVACHOL) 10 MG tablet Take 1 tablet (10 mg total) by mouth every evening. 09/27/14  Yes [name], MD  cephALEXin (KEFLEX) 500 MG capsule Take 1 capsule (500 mg total) by mouth 2 (two) times daily. Patient not taking: Reported on 10/08/2015 09/26/15   Wendie Agreste, MD   Physical Exam: Filed Vitals:   10/08/15 1200 10/08/15 1436 10/08/15 1600 10/08/15 1635  BP: 163/69 151/65 157/52 155/66  Pulse:  68 76 79  Temp:  98.4 F (36.9 C)  98.8 F (37.1 C)  TempSrc:  Oral  Oral  Resp:  18 18   Height:    5\' 1"  (1.549 m)  Weight:    58.968 kg (130 lb)  SpO2:  100% 98% 99%    Wt Readings from Last 3 Encounters:  10/08/15 58.968 kg (130 lb)  09/26/15 59.512 kg (131 lb 3.2 oz)  07/24/15 60.328 kg (133 lb)    General:  Appears calm and comfortable Eyes: PERRL, normal lids, irises & conjunctiva Neck: no LAD, masses or thyromegaly Cardiovascular: RRR, no m/r/g. No LE edema. Respiratory: CTA bilaterally, no w/r/r. Normal respiratory effort. Abdomen: soft, diffuse lower  quadrant tenderness, no signs of peritonitis.  Skin: no rash or induration seen on limited exam Musculoskeletal: grossly normal tone BUE/BLE Psychiatric: grossly normal mood and affect, speech fluent and appropriate Neurologic: grossly non-focal.          Labs on Admission:  Basic Metabolic Panel:  Recent Labs Lab 10/08/15 1131  NA 142  K 4.1  CL 108  CO2 25  GLUCOSE 138*  BUN 22*  CREATININE 0.82  CALCIUM 9.5   Liver Function Tests:  Recent Labs Lab 10/08/15 1131  AST 25  ALT 19  ALKPHOS 69  BILITOT 0.5  PROT 6.7  ALBUMIN 3.9   No results for input(s): LIPASE, AMYLASE in the last 168 hours. No results for input(s): AMMONIA in the last 168 hours. CBC:  Recent Labs Lab 10/08/15 1008 10/08/15 1131  WBC 34.2* 32.9*  NEUTROABS  --  29.0*  HGB 13.4 12.3  HCT 40.9 39.2  MCV 77.3* 81.2  PLT  --  228   Cardiac Enzymes: No results for input(s): CKTOTAL, CKMB, CKMBINDEX, TROPONINI in the last 168 hours.  BNP (last 3 results) No results for input(s): BNP in the last 8760 hours.  ProBNP (last 3 results) No results for input(s): PROBNP in the last 8760 hours.  CBG: No results for input(s): GLUCAP in the last 168 hours.  Radiological Exams on Admission: Ct Abdomen Pelvis W Contrast  10/08/2015  CLINICAL DATA:  Right lower abdominal pain with nausea and vomiting and diarrhea since early this morning. EXAM: CT ABDOMEN AND PELVIS WITH CONTRAST TECHNIQUE: Multidetector CT imaging of the abdomen and pelvis was performed using the standard protocol following bolus administration of intravenous contrast. CONTRAST:  146mL OMNIPAQUE IOHEXOL 300 MG/ML SOLN, 64mL OMNIPAQUE IOHEXOL 300 MG/ML SOLN COMPARISON:  08/19/2014 FINDINGS: Lung bases: Essentially clear. Heart is normal in size. Moderate size hiatal hernia. Liver, spleen, gallbladder: Unremarkable. Pancreas: 11 mm cystic area in the pancreatic head which may reflect an area dilation of the confluence of the common bile  duct and pancreatic duct. This is stable from the prior CT. Pancreas otherwise unremarkable. Adrenal glands:  No masses. Kidneys, ureters, bladder:  Unremarkable. Lymph nodes:  No adenopathy. Ascites:  None. Gastrointestinal: There is wall thickening and adjacent inflammatory change which extends across the transverse colon through the descending colon. There are several sigmoid and lower descending colon diverticula but no evidence of diverticulitis. Small bowel is unremarkable. Normal appendix visualized. Uterus is surgically absent.  No pelvic masses. Musculoskeletal: Degenerative changes throughout the visualized spine. Bones are demineralized. There is also a S-shaped scoliosis of the lumbar spine. IMPRESSION: 1. Wall thickening and hazy inflammatory change extends along the transverse and descending colon consistent with an infectious or inflammatory colitis. 2. No other acute findings. Electronically Signed   By: Lajean Manes M.D.   On: 10/08/2015 14:02    EKG: Independently reviewed.   Assessment/Plan Active Problems:   Colitis   Diffuse transverse colon and descending colon colitis: Admit to med surg bed,  Get GI pathogen pcr.  Clear liquid diet and IV zosyn for colitis.  Hydration and repeat lactic acid in am.  - IV pepcid. ,stop omeprazole.    Leukocytosis: possibly from the colitis.    Elevated lactic acid: From the above.    CREST syndrome: - not on meds.    Code Status: full code.  DVT Prophylaxis: Family Communication: noen at bedside.  Disposition Plan: pending RESOLUTION of the diarrhea.  Time spent: 50 minutes  [facility] Pager [phone]

## 2015-10-08 NOTE — ED Notes (Signed)
Pt placed on Enteric precautions

## 2015-10-08 NOTE — ED Notes (Signed)
Bed: WA10 Expected date:  Expected time:  Means of arrival:  Comments: EMS-abdominal pain 

## 2015-10-08 NOTE — Progress Notes (Signed)
This chart was scribed for [name], MD by [name], Medical Scribe. This patient was seen in Room 12 and the patient's care was started 9:47 AM.  Chief Complaint:  Chief Complaint  Patient presents with  . Abdominal Pain    early this morning, cramping  . Emesis    constant  . Diarrhea    HPI: Kristine [name] is a 74 y.o. female who reports to [facility] today complaining of cramping abd pain that started this morning (6:00PM) with vomiting and diarrhea. She felt some gurgling on the left side of her abd. She was burping a lot to the point she vomited, up to 10-12 times this morning alone. She's had diarrhea followed up, and been to the bathroom 10-12 times. She was previously seen by Dr. Carlota Raspberry in the office for diverticulitis. She had some bacon and eggs with a cup of tea for dinner.  She has just completed a 10 day course of cephalexin. all  She plans on getting fundoplication surgery for her GERD, and had discussion and x-rays done 5 days ago.   Past Medical History  Diagnosis Date  . GERD (gastroesophageal reflux disease)   . Barrett's esophagus     Stage B  . Cystocele   . Rectocele     mild  . Hypertension   . Diverticulitis   . Arthritis   . Raynaud disease   . CREST variant of scleroderma ([place])   . Osteopenia 09/2012    T score -2.4 FRAX 13%/2.7%  per 10/15/2012 discussion plan repeat in 2 years  . Anemia   . STEMI (ST elevation myocardial infarction) ([place]) 06/21/2013  . CAD (coronary artery disease)   . Hyperlipidemia    Past Surgical History  Procedure Laterality Date  . Tonsillectomy  1950  . Tubal ligation  1980  . Nissen fundoplication  123456  . Total knee arthroplasty      right  . Joint replacement  9/10    rt total  . Foot surgery  2013    x2  . Abdominal hysterectomy  1996    RSO for menorrhagia.  . Coronary angioplasty with stent placement  06/21/2013   Social History   Social History  . Marital Status: Divorced    Spouse Name: N/A  .  Number of Children: N/A  . Years of Education: N/A   Social History Main Topics  . Smoking status: Never Smoker   . Smokeless tobacco: Never Used  . Alcohol Use: 0.0 oz/week    0 Standard drinks or equivalent per week     Comment: Occasional  . Drug Use: No  . Sexual Activity: No   Other Topics Concern  . None   Social History Narrative   Exercise dancing 3 x times weekly for 1-2 hours   Family History  Problem Relation Age of Onset  . Cancer Mother     Pancreatic Cancer  . Hepatitis C Mother   . Heart disease Father     congestive heart failure  . Panic disorder Daughter   . Asthma Maternal Grandmother   . Emphysema Maternal Grandmother   . Cancer Maternal Grandfather     lung  . Heart attack Father   . Stroke Neg Hx    Allergies  Allergen Reactions  . Aspirin Other (See Comments)    Pt is prone to bleeding, ok with low dose coated aspirin  . Nsaids Other (See Comments)    Pt is prone to bleeding  .  Pravastatin Sodium     Other reaction(s): Other (See Comments) elevated LFT'S, tolerates 1/2 dose of zetia and 1/2 dose of pravastatin  . Statins Other (See Comments)    elevated LFT'S, tolerates 1/2 dose of zetia and 1/2 dose of pravastatin   Prior to Admission medications   Medication Sig Start Date End Date Taking? Authorizing Provider  ALPRAZolam Duanne Moron) 0.25 MG tablet Take 1/2-1 tablet as needed for anxiety 05/27/15   Darlyne Russian, MD  aspirin EC 81 MG EC tablet Take 1 tablet (81 mg total) by mouth daily. 06/24/13   Rhonda G Barrett, PA-C  cephALEXin (KEFLEX) 500 MG capsule Take 1 capsule (500 mg total) by mouth 2 (two) times daily. 09/26/15   Wendie Agreste, MD  ezetimibe (ZETIA) 10 MG tablet Take 0.5 tablets (5 mg total) by mouth daily. 09/26/14   [name], MD  losartan (COZAAR) 50 MG tablet Take one half tablet daily 05/27/15   Darlyne Russian, MD  Multiple Vitamin (MULTIVITAMIN WITH MINERALS) TABS Take 1 tablet by mouth daily.     Historical Provider, MD    nitroGLYCERIN (NITROSTAT) 0.4 MG SL tablet Place 1 tablet (0.4 mg total) under the tongue every 5 (five) minutes x 3 doses as needed for chest pain. 06/24/13   Rhonda G Barrett, PA-C  omeprazole (PRILOSEC) 20 MG capsule Take 20 mg by mouth 2 (two) times daily. 05/03/15 05/02/16  Historical Provider, MD  pravastatin (PRAVACHOL) 10 MG tablet Take 1 tablet (10 mg total) by mouth every evening. 09/27/14   [name], MD     ROS:  Constitutional: negative for chills, fever, night sweats, weight changes, or fatigue  HEENT: negative for vision changes, hearing loss, congestion, rhinorrhea, ST, epistaxis, or sinus pressure Cardiovascular: negative for chest pain or palpitations Respiratory: negative for hemoptysis, wheezing, shortness of breath, or cough Abdominal: negative for nausea, or constipation; positive for abd pain, vomiting, diarrhea Dermatological: negative for rash Neurologic: negative for headache, dizziness, or syncope All other systems reviewed and are otherwise negative with the exception to those above and in the HPI.  PHYSICAL EXAM: Filed Vitals:   10/08/15 0925  BP: 174/79  Pulse: 75  Temp: 98 F (36.7 C)  Resp: 18   There is no weight on file to calculate BMI.   General: Alert, no acute distress HEENT:  Normocephalic, atraumatic, oropharynx patent. Eye: Juliette Mangle [facility] Cardiovascular:  Regular rate and rhythm, no rubs murmurs or gallops.  No Carotid bruits, radial pulse intact. No pedal edema.  Respiratory: Clear to auscultation bilaterally.  No wheezes, rales, or rhonchi.  No cyanosis, no use of accessory musculature Abdominal: hyperactive bowel sounds, severe tenderness and fullness in left abdomen with no rebound Musculoskeletal: Gait intact. No edema, tenderness Skin: No rashes. Neurologic: Facial musculature symmetric. Psychiatric: Patient acts appropriately throughout our interaction.  Lymphatic: No cervical or submandibular  lymphadenopathy Genitourinary/Anorectal: No acute findings   LABS:    EKG/XRAY:   Primary read interpreted by Dr. Everlene Farrier at [facility].   ASSESSMENT/PLAN: 9:55AM EMS was called for transport  patient presents with the acute onset this morning of severe lower abdominal pain associated with vomiting diarrhea and shaking chills. Of note patient was recently treated for diverticulitis with a ten  Day course of cephalexin. Differential includes diverticulitis versus diverticular rupture versus Clostridium difficile. Patient will have IV started transport to the hospital for their evaluation. We'll try and draw CBC off the IV line.  By signing my name below, I, [name], attest that  this documentation has been prepared under the direction and in the presence of [name], MD. Electronically Signed: [name], [place]. 10/08/2015 , 9:47 AM .  I personally performed the services described in this documentation, which was scribed in my presence. The recorded information has been reviewed and is accurate.  Gross sideeffects, risk and benefits, and alternatives of medications d/w patient. Patient is aware that all medications have potential sideeffects and we are unable to predict every sideeffect or drug-drug interaction that may occur.  [name] MD 10/08/2015 9:47 AM

## 2015-10-08 NOTE — ED Notes (Signed)
Writer notified EDP Reese of abnormal i-stat lactic result

## 2015-10-08 NOTE — ED Notes (Signed)
Awake. Verbally responsive. A/O x4. Resp even and unlabored. No audible adventitious breath sounds noted. ABC's intact. IV infusing NS without difficulty. Pt continues to have diarrhea.

## 2015-10-08 NOTE — ED Provider Notes (Signed)
CSN: [id number]     Arrival date & time 10/08/15  1028 History   First MD Initiated Contact with Patient 10/08/15 1047     Chief Complaint  Patient presents with  . Abdominal Pain      The history is provided by the patient. No language interpreter was used.   Kristine [name] is a 74 year old here for evaluation of abdominal pain, vomiting, diarrhea. She has a history of diverticulitis and is on her last day of a 10 day course of Keflex for a flare. This morning around 6 AM she awoke with lower abdominal pain, severe vomiting. About 3 hours ago she developed severe diarrhea. She denies any fevers but has significant chills. She was seen at urgent care and referred to the emergency department for further evaluation. She reports generalized weakness. Denies chest pain. Symptoms are severe, constant, worsening.  Past Medical History  Diagnosis Date  . GERD (gastroesophageal reflux disease)   . Barrett's esophagus     Stage B  . Cystocele   . Rectocele     mild  . Hypertension   . Diverticulitis   . Arthritis   . Raynaud disease   . CREST variant of scleroderma ([place])   . Osteopenia 09/2012    T score -2.4 FRAX 13%/2.7%  per 10/15/2012 discussion plan repeat in 2 years  . Anemia   . STEMI (ST elevation myocardial infarction) ([place]) 06/21/2013  . CAD (coronary artery disease)   . Hyperlipidemia    Past Surgical History  Procedure Laterality Date  . Tonsillectomy  1950  . Tubal ligation  1980  . Nissen fundoplication  123456  . Total knee arthroplasty      right  . Joint replacement  9/10    rt total  . Foot surgery  2013    x2  . Abdominal hysterectomy  1996    RSO for menorrhagia.  . Coronary angioplasty with stent placement  06/21/2013   Family History  Problem Relation Age of Onset  . Cancer Mother     Pancreatic Cancer  . Hepatitis C Mother   . Heart disease Father     congestive heart failure  . Panic disorder Daughter   . Asthma Maternal Grandmother   . Emphysema  Maternal Grandmother   . Cancer Maternal Grandfather     lung  . Heart attack Father   . Stroke Neg Hx    Social History  Substance Use Topics  . Smoking status: Never Smoker   . Smokeless tobacco: Never Used  . Alcohol Use: 0.0 oz/week    0 Standard drinks or equivalent per week     Comment: Occasional   OB History    Gravida Para Term Preterm AB TAB SAB Ectopic Multiple Living   1 1        1      Review of Systems  All other systems reviewed and are negative.     Allergies  Aspirin; Nsaids; Pravastatin sodium; and Statins  Home Medications   Prior to Admission medications   Medication Sig Start Date End Date Taking? Authorizing Provider  ALPRAZolam Duanne Moron) 0.25 MG tablet Take 1/2-1 tablet as needed for anxiety 05/27/15   Darlyne Russian, MD  aspirin EC 81 MG EC tablet Take 1 tablet (81 mg total) by mouth daily. 06/24/13   Rhonda G Barrett, PA-C  cephALEXin (KEFLEX) 500 MG capsule Take 1 capsule (500 mg total) by mouth 2 (two) times daily. 09/26/15   Wendie Agreste, MD  ezetimibe (ZETIA) 10 MG tablet Take 0.5 tablets (5 mg total) by mouth daily. 09/26/14   [name], MD  losartan (COZAAR) 50 MG tablet Take one half tablet daily 05/27/15   Darlyne Russian, MD  Multiple Vitamin (MULTIVITAMIN WITH MINERALS) TABS Take 1 tablet by mouth daily.     Historical Provider, MD  nitroGLYCERIN (NITROSTAT) 0.4 MG SL tablet Place 1 tablet (0.4 mg total) under the tongue every 5 (five) minutes x 3 doses as needed for chest pain. 06/24/13   Rhonda G Barrett, PA-C  omeprazole (PRILOSEC) 20 MG capsule Take 20 mg by mouth 2 (two) times daily. 05/03/15 05/02/16  Historical Provider, MD  pravastatin (PRAVACHOL) 10 MG tablet Take 1 tablet (10 mg total) by mouth every evening. 09/27/14   [name], MD   BP 151/66 mmHg  Pulse 80  Temp(Src) 97.9 F (36.6 C) (Oral)  Resp 18  SpO2 94%  LMP 11/25/1994 Physical Exam  Constitutional: She is oriented to person, place, and time. She appears  well-developed and well-nourished. She appears distressed.  HENT:  Head: Normocephalic and atraumatic.  Cardiovascular: Normal rate and regular rhythm.   Pulmonary/Chest: Effort normal. No respiratory distress.  Abdominal: Soft.  Mild diffuse abdominal tenderness  Musculoskeletal: She exhibits no edema or tenderness.  Neurological: She is alert and oriented to person, place, and time.  Skin: Skin is warm and dry.  Psychiatric: She has a normal mood and affect. Her behavior is normal.  Nursing note and vitals reviewed.   ED Course  Procedures (including critical care time) Labs Review Labs Reviewed  COMPREHENSIVE METABOLIC PANEL - Abnormal; Notable for the following:    Glucose, Bld 138 (*)    BUN 22 (*)    All other components within normal limits  CBC WITH DIFFERENTIAL/PLATELET - Abnormal; Notable for the following:    WBC 32.9 (*)    MCH 25.5 (*)    Neutro Abs 29.0 (*)    Monocytes Absolute 1.6 (*)    All other components within normal limits  CBC - Abnormal; Notable for the following:    WBC 16.3 (*)    Hemoglobin 11.1 (*)    HCT 35.4 (*)    MCH 25.2 (*)    RDW 15.7 (*)    All other components within normal limits  BASIC METABOLIC PANEL - Abnormal; Notable for the following:    Glucose, Bld 114 (*)    Calcium 8.5 (*)    All other components within normal limits  I-STAT CG4 LACTIC ACID, ED - Abnormal; Notable for the following:    Lactic Acid, Venous 2.56 (*)    All other components within normal limits  C DIFFICILE QUICK SCREEN W PCR REFLEX  TSH  MAGNESIUM  GI PATHOGEN PANEL BY PCR, STOOL    Imaging Review Ct Abdomen Pelvis W Contrast  10/08/2015  CLINICAL DATA:  Right lower abdominal pain with nausea and vomiting and diarrhea since early this morning. EXAM: CT ABDOMEN AND PELVIS WITH CONTRAST TECHNIQUE: Multidetector CT imaging of the abdomen and pelvis was performed using the standard protocol following bolus administration of intravenous contrast. CONTRAST:   161mL OMNIPAQUE IOHEXOL 300 MG/ML SOLN, 70mL OMNIPAQUE IOHEXOL 300 MG/ML SOLN COMPARISON:  08/19/2014 FINDINGS: Lung bases: Essentially clear. Heart is normal in size. Moderate size hiatal hernia. Liver, spleen, gallbladder: Unremarkable. Pancreas: 11 mm cystic area in the pancreatic head which may reflect an area dilation of the confluence of the common bile duct and pancreatic duct. This is stable from the  prior CT. Pancreas otherwise unremarkable. Adrenal glands:  No masses. Kidneys, ureters, bladder:  Unremarkable. Lymph nodes:  No adenopathy. Ascites:  None. Gastrointestinal: There is wall thickening and adjacent inflammatory change which extends across the transverse colon through the descending colon. There are several sigmoid and lower descending colon diverticula but no evidence of diverticulitis. Small bowel is unremarkable. Normal appendix visualized. Uterus is surgically absent.  No pelvic masses. Musculoskeletal: Degenerative changes throughout the visualized spine. Bones are demineralized. There is also a S-shaped scoliosis of the lumbar spine. IMPRESSION: 1. Wall thickening and hazy inflammatory change extends along the transverse and descending colon consistent with an infectious or inflammatory colitis. 2. No other acute findings. Electronically Signed   By: Lajean Manes M.D.   On: 10/08/2015 14:02   I have personally reviewed and evaluated these images and lab results as part of my medical decision-making.   EKG Interpretation None      MDM   Final diagnoses:  Colitis    Patient here with abdominal pain, vomiting, diarrhea. Initial concern for C. difficile colitis given her profound amount of diarrhea, examination, leukocytosis. C. difficile study negative in the emergency department. CT scan demonstrates colitis. Providing Cipro and Flagyl. Plan to admit to medicine for further management given patient's profound diarrhea and dehydration.    Quintella Reichert, MD 10/09/15 [phone]

## 2015-10-08 NOTE — ED Notes (Signed)
Pt reported lower abd pain with n/v x10 and diarrhea x20 since 0600. Pt was sent form Urgent Care. Pt denies urinary problems

## 2015-10-08 NOTE — Progress Notes (Signed)
ANTIBIOTIC CONSULT NOTE - INITIAL  Pharmacy Consult for Zosyn Indication: Intra-abdominal infection  Allergies  Allergen Reactions  . Aspirin Other (See Comments)    Pt is prone to bleeding, ok with low dose coated aspirin  . Nsaids Other (See Comments)    Pt is prone to bleeding  . Pravastatin Sodium     Other reaction(s): Other (See Comments) elevated LFT'S, tolerates 1/2 dose of zetia and 1/2 dose of pravastatin  . Statins Other (See Comments)    elevated LFT'S, tolerates 1/2 dose of zetia and 1/2 dose of pravastatin    Patient Measurements: Height: 5\' 1"  (154.9 cm) Weight: 130 lb (58.968 kg) IBW/kg (Calculated) : 47.8  Vital Signs: Temp: 98.8 F (37.1 C) (11/13 1635) Temp Source: Oral (11/13 1635) BP: 155/66 mmHg (11/13 1635) Pulse Rate: 79 (11/13 1635) Intake/Output from previous day:   Intake/Output from this shift:    Labs:  Recent Labs  10/08/15 1008 10/08/15 1131  WBC 34.2* 32.9*  HGB 13.4 12.3  PLT  --  228  CREATININE  --  0.82   Estimated Creatinine Clearance: 51.2 mL/min (by C-G formula based on Cr of 0.82). No results for input(s): VANCOTROUGH, VANCOPEAK, VANCORANDOM, GENTTROUGH, GENTPEAK, GENTRANDOM, TOBRATROUGH, TOBRAPEAK, TOBRARND, AMIKACINPEAK, AMIKACINTROU, AMIKACIN in the last 72 hours.   Microbiology: Recent Results (from the past 720 hour(s))  Urine culture     Status: None   Collection Time: 09/26/15  3:21 PM  Result Value Ref Range Status   Colony Count NO GROWTH  Final   Organism ID, Bacteria NO GROWTH  Final  C difficile quick scan w PCR reflex     Status: None   Collection Time: 10/08/15 12:01 PM  Result Value Ref Range Status   C Diff antigen NEGATIVE NEGATIVE Final   C Diff toxin NEGATIVE NEGATIVE Final   C Diff interpretation Negative for toxigenic C. difficile  Final    Medical History: Past Medical History  Diagnosis Date  . GERD (gastroesophageal reflux disease)   . Barrett's esophagus     Stage B  . Cystocele   .  Rectocele     mild  . Hypertension   . Diverticulitis   . Arthritis   . Raynaud disease   . CREST variant of scleroderma ([place])   . Osteopenia 09/2012    T score -2.4 FRAX 13%/2.7%  per 10/15/2012 discussion plan repeat in 2 years  . Anemia   . STEMI (ST elevation myocardial infarction) ([place]) 06/21/2013  . CAD (coronary artery disease)   . Hyperlipidemia     Medications:  Scheduled:  Infusions:  PRN:   Assessment: 74 year old female who presents for evaluation of abdominal pain, vomiting, diarrhea. She has a history of diverticulitis and is on her last day of a 10 day course of Keflex for a flare. She developed severe diarrhea early this AM; C.diff studies are negative. She was given Cipro and Flagyl in the ED. CT shows inflammatory changes consistent with consistent with an infectious or inflammatory colitis. Pharmacy is now consulted to dose Zosyn.  Goal of Therapy:  Appropriate antibiotic dose for indication and renal function Eradication of infection  Plan:  Zosyn 3.375gm IV q8h (4hr extended infusions) No further dosing adjustments anticipated, Pharmacy will sign-off; please re-consulted if clinical situation warrants  Peggyann Juba, PharmD, BCPS Pager: [phone] 10/08/2015,5:00 PM

## 2015-10-08 NOTE — Progress Notes (Signed)
Pt having a small amount of blood mixed in with her stools. Has Lovenox ordered. Ask provider on call I should administer it, she said I could, but let her know if pt's bleeding increased.

## 2015-10-08 NOTE — ED Notes (Addendum)
Awake. Verbally responsive. A/O x4. Resp even and unlabored. No audible adventitious breath sounds noted. ABC's intact. IV infusing NS and ABT without adverse reaction noted. Pt continues to have diarrhea. Family at bedside.

## 2015-10-09 ENCOUNTER — Inpatient Hospital Stay (HOSPITAL_COMMUNITY): Payer: BLUE CROSS/BLUE SHIELD

## 2015-10-09 LAB — CBC
HCT: 35.4 % — ABNORMAL LOW (ref 36.0–46.0)
Hemoglobin: 11.1 g/dL — ABNORMAL LOW (ref 12.0–15.0)
MCH: 25.2 pg — ABNORMAL LOW (ref 26.0–34.0)
MCHC: 31.4 g/dL (ref 30.0–36.0)
MCV: 80.5 fL (ref 78.0–100.0)
PLATELETS: 174 10*3/uL (ref 150–400)
RBC: 4.4 MIL/uL (ref 3.87–5.11)
RDW: 15.7 % — ABNORMAL HIGH (ref 11.5–15.5)
WBC: 16.3 10*3/uL — ABNORMAL HIGH (ref 4.0–10.5)

## 2015-10-09 LAB — BASIC METABOLIC PANEL
Anion gap: 6 (ref 5–15)
BUN: 15 mg/dL (ref 6–20)
CO2: 24 mmol/L (ref 22–32)
CREATININE: 0.63 mg/dL (ref 0.44–1.00)
Calcium: 8.5 mg/dL — ABNORMAL LOW (ref 8.9–10.3)
Chloride: 108 mmol/L (ref 101–111)
GFR calc non Af Amer: >60 mL/min (ref >60)
Glucose, Bld: 114 mg/dL — ABNORMAL HIGH (ref 65–99)
Potassium: 3.7 mmol/L (ref 3.5–5.1)
SODIUM: 138 mmol/L (ref 135–145)

## 2015-10-09 LAB — MAGNESIUM: Magnesium: 2 mg/dL (ref 1.7–2.4)

## 2015-10-09 LAB — LACTIC ACID, PLASMA
Lactic Acid, Venous: 2.1 mmol/L (ref 0.5–2.0)
Lactic Acid, Venous: 0.7 mmol/L (ref 0.5–2.0)

## 2015-10-09 LAB — TSH: TSH: 1.58 u[IU]/mL (ref 0.350–4.500)

## 2015-10-09 MED ORDER — SODIUM CHLORIDE 0.9 % IV BOLUS (SEPSIS): 1000.0000 mL | Freq: Once | INTRAVENOUS | Status: DC

## 2015-10-09 MED ORDER — SODIUM CHLORIDE 0.9 % IV BOLUS (SEPSIS)
1000.0000 mL | Freq: Once | INTRAVENOUS | Status: AC
  Administered 2015-10-09: 1000 mL via INTRAVENOUS

## 2015-10-09 NOTE — Progress Notes (Signed)
[facility] PROGRESS NOTE  Kristine [name] [id number] DOB: 12/12/1940 DOA: 10/08/2015 PCP: Jenny Reichmann, MD  Assessment/Plan: 1. Acute colitis.  - on IV zosyn , hydration, IV fluids and IV anti emetics.  - abd slightly distended, abd film ordered.  - persistently elevated lactic acid.  - IV pepcid.  - will call GI in am for further recommendations.  - she remains hemodynamically stable.  - slight drop in hemoglobin.  She is afebrile and leukocytosis is improving.     Code Status: full code.  Family Communication: none at bedside, called her daughter over the phone.  Disposition Plan: pending, possibly in 2 days. When able to tolerate a regular diet.    Consultants:  none  Procedures: CT abd and pelvis.  Antibiotics:  Zosyn 11/13  HPI/Subjective: The pain has improved, the diarrhea has improved, but she reports seeing some blood with bowel movements.   Objective: Filed Vitals:   10/09/15 1402  BP: 144/58  Pulse: 79  Temp: 98.7 F (37.1 C)  Resp: 18    Intake/Output Summary (Last 24 hours) at 10/09/15 1802 Last data filed at 10/09/15 1500  Gross per 24 hour  Intake 2231.25 ml  Output    500 ml  Net 1731.25 ml   Filed Weights   10/08/15 1635  Weight: 58.968 kg (130 lb)    Exam:   General:  Alert afebrile comfortable  Cardiovascular: s1s2  Respiratory: ctab  Abdomen: soft gen tenderness in the lower quadrant,mild distention.   Musculoskeletal: no pedal edema.   Data Reviewed: Basic Metabolic Panel:  Recent Labs Lab 10/08/15 1131 10/09/15 0535  NA 142 138  K 4.1 3.7  CL 108 108  CO2 25 24  GLUCOSE 138* 114*  BUN 22* 15  CREATININE 0.82 0.63  CALCIUM 9.5 8.5*  MG  --  2.0   Liver Function Tests:  Recent Labs Lab 10/08/15 1131  AST 25  ALT 19  ALKPHOS 69  BILITOT 0.5  PROT 6.7  ALBUMIN 3.9   No results for input(s): LIPASE, AMYLASE in the last 168 hours. No results for input(s): AMMONIA in the last 168  hours. CBC:  Recent Labs Lab 10/08/15 1008 10/08/15 1131 10/09/15 0535  WBC 34.2* 32.9* 16.3*  NEUTROABS  --  29.0*  --   HGB 13.4 12.3 11.1*  HCT 40.9 39.2 35.4*  MCV 77.3* 81.2 80.5  PLT  --  228 174   Cardiac Enzymes: No results for input(s): CKTOTAL, CKMB, CKMBINDEX, TROPONINI in the last 168 hours. BNP (last 3 results) No results for input(s): BNP in the last 8760 hours.  ProBNP (last 3 results) No results for input(s): PROBNP in the last 8760 hours.  CBG: No results for input(s): GLUCAP in the last 168 hours.  Recent Results (from the past 240 hour(s))  C difficile quick scan w PCR reflex     Status: None   Collection Time: 10/08/15 12:01 PM  Result Value Ref Range Status   C Diff antigen NEGATIVE NEGATIVE Final   C Diff toxin NEGATIVE NEGATIVE Final   C Diff interpretation Negative for toxigenic C. difficile  Final     Studies: Ct Abdomen Pelvis W Contrast  10/08/2015  CLINICAL DATA:  Right lower abdominal pain with nausea and vomiting and diarrhea since early this morning. EXAM: CT ABDOMEN AND PELVIS WITH CONTRAST TECHNIQUE: Multidetector CT imaging of the abdomen and pelvis was performed using the standard protocol following bolus administration of intravenous contrast. CONTRAST:  173mL OMNIPAQUE IOHEXOL 300  MG/ML SOLN, 45mL OMNIPAQUE IOHEXOL 300 MG/ML SOLN COMPARISON:  08/19/2014 FINDINGS: Lung bases: Essentially clear. Heart is normal in size. Moderate size hiatal hernia. Liver, spleen, gallbladder: Unremarkable. Pancreas: 11 mm cystic area in the pancreatic head which may reflect an area dilation of the confluence of the common bile duct and pancreatic duct. This is stable from the prior CT. Pancreas otherwise unremarkable. Adrenal glands:  No masses. Kidneys, ureters, bladder:  Unremarkable. Lymph nodes:  No adenopathy. Ascites:  None. Gastrointestinal: There is wall thickening and adjacent inflammatory change which extends across the transverse colon through the  descending colon. There are several sigmoid and lower descending colon diverticula but no evidence of diverticulitis. Small bowel is unremarkable. Normal appendix visualized. Uterus is surgically absent.  No pelvic masses. Musculoskeletal: Degenerative changes throughout the visualized spine. Bones are demineralized. There is also a S-shaped scoliosis of the lumbar spine. IMPRESSION: 1. Wall thickening and hazy inflammatory change extends along the transverse and descending colon consistent with an infectious or inflammatory colitis. 2. No other acute findings. Electronically Signed   By: Lajean Manes M.D.   On: 10/08/2015 14:02    Scheduled Meds: . famotidine (PEPCID) IV  20 mg Intravenous Q12H  . piperacillin-tazobactam (ZOSYN)  IV  3.375 g Intravenous Q8H  . sodium chloride  1,000 mL Intravenous Once   Continuous Infusions: . sodium chloride 75 mL/hr at 10/09/15 [id number]    Active Problems:   Colitis    Time spent: 25 min    [facility] Pager [phone]  If 7PM-7AM, please contact night-coverage at [email or web address], password [facility] 10/09/2015, 6:02 PM  LOS: 1 day

## 2015-10-09 NOTE — Progress Notes (Signed)
When pt went to the bathroom a small amount of blood came from her rectum.   Looked like small clots.

## 2015-10-09 NOTE — Progress Notes (Signed)
CRITICAL VALUE ALERT  Critical value received:  Lactic acid 2.1  Date of notification:  Nov.14.2016  Time of notification:  1335  Critical value read back:yes  Nurse who received alert:P. Beryl Hornberger  MD notified (1st page):  Dr Karleen Hampshire   Time of first page:  13:36  MD notified (2nd page):  Time of second page:  Responding MD: Karleen Hampshire  Time MD responded:

## 2015-10-10 ENCOUNTER — Ambulatory Visit: Payer: BLUE CROSS/BLUE SHIELD | Admitting: Cardiology

## 2015-10-10 DIAGNOSIS — D62 Acute posthemorrhagic anemia: Secondary | ICD-10-CM | POA: Diagnosis present

## 2015-10-10 LAB — BASIC METABOLIC PANEL
Anion gap: 5 (ref 5–15)
BUN: 9 mg/dL (ref 6–20)
CHLORIDE: 108 mmol/L (ref 101–111)
CO2: 25 mmol/L (ref 22–32)
Calcium: 8.5 mg/dL — ABNORMAL LOW (ref 8.9–10.3)
Creatinine, Ser: 0.73 mg/dL (ref 0.44–1.00)
GFR calc non Af Amer: >60 mL/min (ref >60)
Glucose, Bld: 103 mg/dL — ABNORMAL HIGH (ref 65–99)
POTASSIUM: 3.5 mmol/L (ref 3.5–5.1)
SODIUM: 138 mmol/L (ref 135–145)

## 2015-10-10 LAB — CBC
HEMATOCRIT: 31.7 % — AB (ref 36.0–46.0)
HEMOGLOBIN: 9.7 g/dL — AB (ref 12.0–15.0)
MCH: 24.9 pg — ABNORMAL LOW (ref 26.0–34.0)
MCHC: 30.6 g/dL (ref 30.0–36.0)
MCV: 81.3 fL (ref 78.0–100.0)
Platelets: 141 10*3/uL — ABNORMAL LOW (ref 150–400)
RBC: 3.9 MIL/uL (ref 3.87–5.11)
RDW: 15.9 % — ABNORMAL HIGH (ref 11.5–15.5)
WBC: 10.7 10*3/uL — ABNORMAL HIGH (ref 4.0–10.5)

## 2015-10-10 LAB — IRON AND TIBC
Iron: 25 ug/dL — ABNORMAL LOW (ref 28–170)
SATURATION RATIOS: 8 % — AB (ref 10.4–31.8)
TIBC: 333 ug/dL (ref 250–450)
UIBC: 308 ug/dL

## 2015-10-10 LAB — FOLATE: FOLATE: 25.3 ng/mL (ref >5.9)

## 2015-10-10 LAB — RETICULOCYTES
RBC.: 4.3 MIL/uL (ref 3.87–5.11)
RETIC COUNT ABSOLUTE: 68.8 10*3/uL (ref 19.0–186.0)
Retic Ct Pct: 1.6 % (ref 0.4–3.1)

## 2015-10-10 LAB — VITAMIN B12: VITAMIN B 12: 692 pg/mL (ref 180–914)

## 2015-10-10 LAB — FERRITIN: Ferritin: 19 ng/mL (ref 11–307)

## 2015-10-10 NOTE — Progress Notes (Signed)
[facility] PROGRESS NOTE  Kristine [name] [id number] DOB: Nov 27, 1940 DOA: 10/08/2015 PCP: Jenny Reichmann, MD Brief history: Kristine [name] is a 74 y.o. female with prior h/o diverticulosis, CREST SYNDrome, barrett's esophagus, hypertension, CAD, presents to ED, after persistent diarrhea, associated with nausea, vomiting and loss of appetite. She had a recent bout of diverticulitis and finished keflex last Thursday ,but had persistent symptoms and was admitted to the hospital for colitis.   Assessment/Plan: 1. Acute colitis.  -  Started on IV zosyn , hydration, IV fluids and IV anti emetics.  - abd slightly distended, abd film ordered, which was not significant. C diff pcr was negative on admission, GI pathogen sent and pending.  - lactic acid normalized after 2 lit fluid boluses. - IV pepcid.  - requested [facility] gastroenterology for recommendations for some rectal bleeding associated with bowel movements since yesterday. She reports having a colonoscopy and EGD at [facility] and i have put in a request to get the records.  - she remains hemodynamically stable.  - drop in hemoglobin from 12 to 9.7 today, anemia panel pending.  She is afebrile and leukocytosis is improving. - currently on clear liquid diet. Plan to advance diet once rectal bleeding stops.     2. Anemia of blood loss : Anemia panel ordered. Normocytic.  - GI consulted for recommendations, to see if she needs a colonoscopy.    3. CREST syndrome, Raynaud's phenomenon; currently not on medications .   4. Hypertension: well controlled.    Code Status: full code.  Family Communication: none at bedside, called her daughter over the phone and updated her on 11/14 evening.  Disposition Plan: pending, possibly in 2 days. When able to tolerate a regular diet and when rectal bleeding stops.    Consultants: [facility] gastroenterology.  Procedures: CT abd and pelvis.  Antibiotics:  Zosyn 11/13  HPI/Subjective: Abdominal pain has  improved, frequency of bowel movements have improved, no nausea, vomiting has resolved. But reports seeing some blood in the bowel movements.   Objective: Filed Vitals:   10/10/15 0800  BP: 134/63  Pulse: 76  Temp: 98.3 F (36.8 C)  Resp: 16    Intake/Output Summary (Last 24 hours) at 10/10/15 1154 Last data filed at 10/10/15 [id number]  Gross per 24 hour  Intake 2993.75 ml  Output      0 ml  Net 2993.75 ml   Filed Weights   10/08/15 1635  Weight: 58.968 kg (130 lb)    Exam:   General:  Alert afebrile comfortable  Cardiovascular: s1s2  Respiratory: ctab  Abdomen: soft gen tenderness in the lower quadrant,mild distention. Good bowel sounds.   Musculoskeletal: no pedal edema.   Data Reviewed: Basic Metabolic Panel:  Recent Labs Lab 10/08/15 1131 10/09/15 0535 10/10/15 0538  NA 142 138 138  K 4.1 3.7 3.5  CL 108 108 108  CO2 25 24 25   GLUCOSE 138* 114* 103*  BUN 22* 15 9  CREATININE 0.82 0.63 0.73  CALCIUM 9.5 8.5* 8.5*  MG  --  2.0  --    Liver Function Tests:  Recent Labs Lab 10/08/15 1131  AST 25  ALT 19  ALKPHOS 69  BILITOT 0.5  PROT 6.7  ALBUMIN 3.9   No results for input(s): LIPASE, AMYLASE in the last 168 hours. No results for input(s): AMMONIA in the last 168 hours. CBC:  Recent Labs Lab 10/08/15 1008 10/08/15 1131 10/09/15 0535 10/10/15 0538  WBC 34.2* 32.9* 16.3* 10.7*  NEUTROABS  --  29.0*  --   --   HGB 13.4 12.3 11.1* 9.7*  HCT 40.9 39.2 35.4* 31.7*  MCV 77.3* 81.2 80.5 81.3  PLT  --  228 174 141*   Cardiac Enzymes: No results for input(s): CKTOTAL, CKMB, CKMBINDEX, TROPONINI in the last 168 hours. BNP (last 3 results) No results for input(s): BNP in the last 8760 hours.  ProBNP (last 3 results) No results for input(s): PROBNP in the last 8760 hours.  CBG: No results for input(s): GLUCAP in the last 168 hours.  Recent Results (from the past 240 hour(s))  C difficile quick scan w PCR reflex     Status: None    Collection Time: 10/08/15 12:01 PM  Result Value Ref Range Status   C Diff antigen NEGATIVE NEGATIVE Final   C Diff toxin NEGATIVE NEGATIVE Final   C Diff interpretation Negative for toxigenic C. difficile  Final     Studies: Ct Abdomen Pelvis W Contrast  10/08/2015  CLINICAL DATA:  Right lower abdominal pain with nausea and vomiting and diarrhea since early this morning. EXAM: CT ABDOMEN AND PELVIS WITH CONTRAST TECHNIQUE: Multidetector CT imaging of the abdomen and pelvis was performed using the standard protocol following bolus administration of intravenous contrast. CONTRAST:  180mL OMNIPAQUE IOHEXOL 300 MG/ML SOLN, 39mL OMNIPAQUE IOHEXOL 300 MG/ML SOLN COMPARISON:  08/19/2014 FINDINGS: Lung bases: Essentially clear. Heart is normal in size. Moderate size hiatal hernia. Liver, spleen, gallbladder: Unremarkable. Pancreas: 11 mm cystic area in the pancreatic head which may reflect an area dilation of the confluence of the common bile duct and pancreatic duct. This is stable from the prior CT. Pancreas otherwise unremarkable. Adrenal glands:  No masses. Kidneys, ureters, bladder:  Unremarkable. Lymph nodes:  No adenopathy. Ascites:  None. Gastrointestinal: There is wall thickening and adjacent inflammatory change which extends across the transverse colon through the descending colon. There are several sigmoid and lower descending colon diverticula but no evidence of diverticulitis. Small bowel is unremarkable. Normal appendix visualized. Uterus is surgically absent.  No pelvic masses. Musculoskeletal: Degenerative changes throughout the visualized spine. Bones are demineralized. There is also a S-shaped scoliosis of the lumbar spine. IMPRESSION: 1. Wall thickening and hazy inflammatory change extends along the transverse and descending colon consistent with an infectious or inflammatory colitis. 2. No other acute findings. Electronically Signed   By: Lajean Manes M.D.   On: 10/08/2015 14:02   Dg Abd 2  Views  10/09/2015  CLINICAL DATA:  Abdominal distention, nausea, vomiting and diarrhea since yesterday. EXAM: ABDOMEN - 2 VIEW COMPARISON:  CT scan 10/08/2015 FINDINGS: The lung bases are grossly clear. Contrast noted in the right colon from the previous abdominal CT scan. No findings for obstruction or perforation. The soft tissue shadows are grossly maintained. The bony structures are intact. IMPRESSION: No plain film findings for an acute abdominal process. Electronically Signed   By: Marijo Sanes M.D.   On: 10/09/2015 19:08    Scheduled Meds: . famotidine (PEPCID) IV  20 mg Intravenous Q12H  . piperacillin-tazobactam (ZOSYN)  IV  3.375 g Intravenous Q8H  . sodium chloride  1,000 mL Intravenous Once   Continuous Infusions: . sodium chloride 75 mL/hr at 10/10/15 [id number]    Active Problems:   Colitis    Time spent: 25 min    [facility] Pager [phone]  If 7PM-7AM, please contact night-coverage at [email or web address], password [facility] 10/10/2015, 11:54 AM  LOS: 2 days

## 2015-10-10 NOTE — Consult Note (Signed)
[facility] Consult Note  Referring Provider: No ref. provider found Primary Care Physician:  Jenny Reichmann, MD Primary Gastroenterologist:  Dr.  Laurel Dimmer Complaint: Abdominal pain and diarrhea HPI: Kristine [name] is an 74 y.o. white female  who presented with sudden onset of lower abdominal pain followed by diarrhea which eventually came bloody. She had some vomiting at the onset which is resolved. Her abdominal pain and diarrhea and bleeding of all subsided over the last 2 days. She had a CT scan which showed colitis involving the transverse and descending colon. C. difficile toxin came back negative but GI pathogens panel is suggesting possible Alinda Sierras virus. Her last colonoscopy was 2 years ago and was reportedly normal. Prior to the onset of her symptoms she developed some somewhat similar lower abdominal pain and diarrhea about a week or 2 prior and was seen at urgent care and diagnosed on clinical grounds with diverticulitis and treated with 7 days of Keflex which ended 5 days ago.  Past Medical History  Diagnosis Date  . GERD (gastroesophageal reflux disease)   . Barrett's esophagus     Stage B  . Cystocele   . Rectocele     mild  . Hypertension   . Diverticulitis   . Arthritis   . Raynaud disease   . CREST variant of scleroderma ([place])   . Osteopenia 09/2012    T score -2.4 FRAX 13%/2.7%  per 10/15/2012 discussion plan repeat in 2 years  . Anemia   . STEMI (ST elevation myocardial infarction) ([place]) 06/21/2013  . CAD (coronary artery disease)   . Hyperlipidemia     Past Surgical History  Procedure Laterality Date  . Tonsillectomy  1950  . Tubal ligation  1980  . Nissen fundoplication  0488  . Total knee arthroplasty      right  . Joint replacement  9/10    rt total  . Foot surgery  2013    x2  . Abdominal hysterectomy  1996    RSO for menorrhagia.  . Coronary angioplasty with stent placement  06/21/2013    Medications Prior to Admission  Medication Sig  Dispense Refill  . ALPRAZolam (XANAX) 0.25 MG tablet Take 1/2-1 tablet as needed for anxiety (Patient taking differently: Take 0.25 mg by mouth 2 (two) times daily as needed for anxiety. Take 1/2-1 tablet as needed for anxiety) 20 tablet 0  . aspirin EC 81 MG EC tablet Take 1 tablet (81 mg total) by mouth daily.    . Cyanocobalamin (B-12 PO) Take 1 tablet by mouth daily.    Marland Kitchen ezetimibe (ZETIA) 10 MG tablet Take 0.5 tablets (5 mg total) by mouth daily. 90 tablet 2  . KRILL OIL PO Take 1 capsule by mouth daily.    Marland Kitchen losartan (COZAAR) 50 MG tablet Take one half tablet daily (Patient taking differently: Take 25 mg by mouth daily. ) 30 tablet 11  . nitroGLYCERIN (NITROSTAT) 0.4 MG SL tablet Place 1 tablet (0.4 mg total) under the tongue every 5 (five) minutes x 3 doses as needed for chest pain. 25 tablet 12  . omeprazole (PRILOSEC) 20 MG capsule Take 20 mg by mouth 2 (two) times daily.    . pravastatin (PRAVACHOL) 10 MG tablet Take 1 tablet (10 mg total) by mouth every evening. 90 tablet 3  . cephALEXin (KEFLEX) 500 MG capsule Take 1 capsule (500 mg total) by mouth 2 (two) times daily. (Patient not taking: Reported on 10/08/2015) 20 capsule 0    Allergies:  Allergies  Allergen Reactions  . Aspirin Other (See Comments)    Pt is prone to bleeding, ok with low dose coated aspirin  . Nsaids Other (See Comments)    Pt is prone to bleeding  . Pravastatin Sodium     Other reaction(s): Other (See Comments) elevated LFT'S, tolerates 1/2 dose of zetia and 1/2 dose of pravastatin  . Statins Other (See Comments)    elevated LFT'S, tolerates 1/2 dose of zetia and 1/2 dose of pravastatin    Family History  Problem Relation Age of Onset  . Cancer Mother     Pancreatic Cancer  . Hepatitis C Mother   . Heart disease Father     congestive heart failure  . Panic disorder Daughter   . Asthma Maternal Grandmother   . Emphysema Maternal Grandmother   . Cancer Maternal Grandfather     lung  . Heart attack  Father   . Stroke Neg Hx     Social History:  reports that she has never smoked. She has never used smokeless tobacco. She reports that she drinks alcohol. She reports that she does not use illicit drugs.  Review of Systems: negative except as above   Blood pressure 134/63, pulse 76, temperature 98.3 F (36.8 C), temperature source Oral, resp. rate 16, height 5' 1" (1.549 m), weight 58.968 kg (130 lb), last menstrual period 11/25/1994, SpO2 97 %. Head: Normocephalic, without obvious abnormality, atraumatic Neck: no adenopathy, no carotid bruit, no JVD, supple, symmetrical, trachea midline and thyroid not enlarged, symmetric, no tenderness/mass/nodules Resp: clear to auscultation bilaterally Cardio: regular rate and rhythm, S1, S2 normal, no murmur, click, rub or gallop GI: Abdomen soft slightly distended with normoactive bowel sounds. No hepatomegaly masses or guarding Extremities: extremities normal, atraumatic, no cyanosis or edema  Results for orders placed or performed during the hospital encounter of 10/08/15 (from the past 48 hour(s))  TSH     Status: None   Collection Time: 10/09/15  5:35 AM  Result Value Ref Range   TSH 1.580 0.350 - 4.500 uIU/mL  CBC     Status: Abnormal   Collection Time: 10/09/15  5:35 AM  Result Value Ref Range   WBC 16.3 (H) 4.0 - 10.5 K/uL   RBC 4.40 3.87 - 5.11 MIL/uL   Hemoglobin 11.1 (L) 12.0 - 15.0 g/dL   HCT 35.4 (L) 36.0 - 46.0 %   MCV 80.5 78.0 - 100.0 fL   MCH 25.2 (L) 26.0 - 34.0 pg   MCHC 31.4 30.0 - 36.0 g/dL   RDW 15.7 (H) 11.5 - 15.5 %   Platelets 174 150 - 400 K/uL  Basic metabolic panel     Status: Abnormal   Collection Time: 10/09/15  5:35 AM  Result Value Ref Range   Sodium 138 135 - 145 mmol/L   Potassium 3.7 3.5 - 5.1 mmol/L   Chloride 108 101 - 111 mmol/L   CO2 24 22 - 32 mmol/L   Glucose, Bld 114 (H) 65 - 99 mg/dL   BUN 15 6 - 20 mg/dL   Creatinine, Ser 0.63 0.44 - 1.00 mg/dL   Calcium 8.5 (L) 8.9 - 10.3 mg/dL   GFR  calc non Af Amer >60 >60 mL/min   GFR calc Af Amer >60 >60 mL/min    Comment: (NOTE) The eGFR has been calculated using the CKD EPI equation. This calculation has not been validated in all clinical situations. eGFR's persistently <60 mL/min signify possible Chronic Kidney Disease.    Anion  gap 6 5 - 15  Magnesium     Status: None   Collection Time: 10/09/15  5:35 AM  Result Value Ref Range   Magnesium 2.0 1.7 - 2.4 mg/dL  Lactic acid, plasma     Status: Abnormal   Collection Time: 10/09/15  1:00 PM  Result Value Ref Range   Lactic Acid, Venous 2.1 (HH) 0.5 - 2.0 mmol/L    Comment: CRITICAL RESULT CALLED TO, READ BACK BY AND VERIFIED WITH: PATTY CORBETT,RN [id number] @ 1335 BY J SCOTTON   Lactic acid, plasma     Status: None   Collection Time: 10/09/15 10:24 PM  Result Value Ref Range   Lactic Acid, Venous 0.7 0.5 - 2.0 mmol/L  CBC     Status: Abnormal   Collection Time: 10/10/15  5:38 AM  Result Value Ref Range   WBC 10.7 (H) 4.0 - 10.5 K/uL   RBC 3.90 3.87 - 5.11 MIL/uL   Hemoglobin 9.7 (L) 12.0 - 15.0 g/dL   HCT 31.7 (L) 36.0 - 46.0 %   MCV 81.3 78.0 - 100.0 fL   MCH 24.9 (L) 26.0 - 34.0 pg   MCHC 30.6 30.0 - 36.0 g/dL   RDW 15.9 (H) 11.5 - 15.5 %   Platelets 141 (L) 150 - 400 K/uL  Basic metabolic panel     Status: Abnormal   Collection Time: 10/10/15  5:38 AM  Result Value Ref Range   Sodium 138 135 - 145 mmol/L   Potassium 3.5 3.5 - 5.1 mmol/L   Chloride 108 101 - 111 mmol/L   CO2 25 22 - 32 mmol/L   Glucose, Bld 103 (H) 65 - 99 mg/dL   BUN 9 6 - 20 mg/dL   Creatinine, Ser 0.73 0.44 - 1.00 mg/dL   Calcium 8.5 (L) 8.9 - 10.3 mg/dL   GFR calc non Af Amer >60 >60 mL/min   GFR calc Af Amer >60 >60 mL/min    Comment: (NOTE) The eGFR has been calculated using the CKD EPI equation. This calculation has not been validated in all clinical situations. eGFR's persistently <60 mL/min signify possible Chronic Kidney Disease.    Anion gap 5 5 - 15  Reticulocytes     Status:  None   Collection Time: 10/10/15 11:28 AM  Result Value Ref Range   Retic Ct Pct 1.6 0.4 - 3.1 %   RBC. 4.30 3.87 - 5.11 MIL/uL   Retic Count, Manual 68.8 19.0 - 186.0 K/uL   Ct Abdomen Pelvis W Contrast  10/08/2015  CLINICAL DATA:  Right lower abdominal pain with nausea and vomiting and diarrhea since early this morning. EXAM: CT ABDOMEN AND PELVIS WITH CONTRAST TECHNIQUE: Multidetector CT imaging of the abdomen and pelvis was performed using the standard protocol following bolus administration of intravenous contrast. CONTRAST:  165m OMNIPAQUE IOHEXOL 300 MG/ML SOLN, 263mOMNIPAQUE IOHEXOL 300 MG/ML SOLN COMPARISON:  08/19/2014 FINDINGS: Lung bases: Essentially clear. Heart is normal in size. Moderate size hiatal hernia. Liver, spleen, gallbladder: Unremarkable. Pancreas: 11 mm cystic area in the pancreatic head which may reflect an area dilation of the confluence of the common bile duct and pancreatic duct. This is stable from the prior CT. Pancreas otherwise unremarkable. Adrenal glands:  No masses. Kidneys, ureters, bladder:  Unremarkable. Lymph nodes:  No adenopathy. Ascites:  None. Gastrointestinal: There is wall thickening and adjacent inflammatory change which extends across the transverse colon through the descending colon. There are several sigmoid and lower descending colon diverticula but no evidence of  diverticulitis. Small bowel is unremarkable. Normal appendix visualized. Uterus is surgically absent.  No pelvic masses. Musculoskeletal: Degenerative changes throughout the visualized spine. Bones are demineralized. There is also a S-shaped scoliosis of the lumbar spine. IMPRESSION: 1. Wall thickening and hazy inflammatory change extends along the transverse and descending colon consistent with an infectious or inflammatory colitis. 2. No other acute findings. Electronically Signed   By: Lajean Manes M.D.   On: 10/08/2015 14:02   Dg Abd 2 Views  10/09/2015  CLINICAL DATA:  Abdominal  distention, nausea, vomiting and diarrhea since yesterday. EXAM: ABDOMEN - 2 VIEW COMPARISON:  CT scan 10/08/2015 FINDINGS: The lung bases are grossly clear. Contrast noted in the right colon from the previous abdominal CT scan. No findings for obstruction or perforation. The soft tissue shadows are grossly maintained. The bony structures are intact. IMPRESSION: No plain film findings for an acute abdominal process. Electronically Signed   By: Marijo Sanes M.D.   On: 10/09/2015 19:08    Assessment: Apparent acute colitis after clinical diagnosis of diverticulitis, unsure whether she has actual colonic diverticuli. Etiologies include C. difficile despite negative toxin, ischemic colitis or other infectious colitis. Inflammatory bowel disease would appear to be less likely. Plan:  As long as improving by all parameters continue antibiotics with slow advancement of diet. We'll follow-up on Alinda Sierras virus results. Do not suspect she will need colonoscopy acutely. Ricquel Foulk C 10/10/2015, 1:10 PM  Pager [phone] If no answer or after 5 PM call [phone]

## 2015-10-10 NOTE — Progress Notes (Signed)
[facility] medical records contacted at [phone] for colonoscopy and EGD report.. Detailed message left with request for release forms to be faxed to [place] [facility] for ms Lorelei Pont to sign. [facility] message states will be followed up on next business day.

## 2015-10-10 NOTE — Progress Notes (Addendum)
Pt noted A&OX4, IVF's infusing without difficulty. Pt with no blood tinged stools noted at this time. Pt maintained on contact precautions.  Pt tolerates clear liquid diet at this time. Abdominal surgical site  noted clean dry and intact. Pt with some tenderness at the site rated 7/10. IV Morphine given (see mar for dosage). Call light and phone in reach

## 2015-10-11 DIAGNOSIS — D62 Acute posthemorrhagic anemia: Secondary | ICD-10-CM

## 2015-10-11 DIAGNOSIS — I10 Essential (primary) hypertension: Secondary | ICD-10-CM

## 2015-10-11 DIAGNOSIS — K529 Noninfective gastroenteritis and colitis, unspecified: Principal | ICD-10-CM

## 2015-10-11 LAB — CBC
HCT: 30.6 % — ABNORMAL LOW (ref 36.0–46.0)
HEMOGLOBIN: 9.4 g/dL — AB (ref 12.0–15.0)
MCH: 24.9 pg — AB (ref 26.0–34.0)
MCHC: 30.7 g/dL (ref 30.0–36.0)
MCV: 81 fL (ref 78.0–100.0)
Platelets: 126 10*3/uL — ABNORMAL LOW (ref 150–400)
RBC: 3.78 MIL/uL — AB (ref 3.87–5.11)
RDW: 15.8 % — ABNORMAL HIGH (ref 11.5–15.5)
WBC: 9.2 10*3/uL (ref 4.0–10.5)

## 2015-10-11 LAB — CBC WITH DIFFERENTIAL/PLATELET
BASOS ABS: 0 10*3/uL (ref 0.0–0.1)
BASOS PCT: 0 %
Eosinophils Absolute: 0.2 10*3/uL (ref 0.0–0.7)
Eosinophils Relative: 2 %
HEMATOCRIT: 31.5 % — AB (ref 36.0–46.0)
HEMOGLOBIN: 9.6 g/dL — AB (ref 12.0–15.0)
Lymphocytes Relative: 19 %
Lymphs Abs: 2.2 10*3/uL (ref 0.7–4.0)
MCH: 25.1 pg — ABNORMAL LOW (ref 26.0–34.0)
MCHC: 30.5 g/dL (ref 30.0–36.0)
MCV: 82.5 fL (ref 78.0–100.0)
Monocytes Absolute: 1.2 10*3/uL — ABNORMAL HIGH (ref 0.1–1.0)
Monocytes Relative: 11 %
NEUTROS ABS: 7.8 10*3/uL — AB (ref 1.7–7.7)
NEUTROS PCT: 68 %
Platelets: 144 10*3/uL — ABNORMAL LOW (ref 150–400)
RBC: 3.82 MIL/uL — AB (ref 3.87–5.11)
RDW: 16.2 % — ABNORMAL HIGH (ref 11.5–15.5)
WBC: 11.3 10*3/uL — AB (ref 4.0–10.5)

## 2015-10-11 LAB — GI PATHOGEN PANEL BY PCR, STOOL
C difficile toxin A/B: NOT DETECTED
CRYPTOSPORIDIUM BY PCR: NOT DETECTED
Campylobacter by PCR: NOT DETECTED
E COLI (ETEC) LT/ST: NOT DETECTED
E COLI (STEC): NOT DETECTED
E COLI 0157 BY PCR: NOT DETECTED
G LAMBLIA BY PCR: NOT DETECTED
NOROVIRUS G1/G2: NOT DETECTED
Rotavirus A by PCR: NOT DETECTED
SHIGELLA BY PCR: NOT DETECTED
Salmonella by PCR: NOT DETECTED

## 2015-10-11 NOTE — Progress Notes (Signed)
[facility] PROGRESS NOTE  Lianny Ollar [id number] DOB: 1941-04-25 DOA: 10/08/2015 PCP: Jenny Reichmann, MD Brief history: Kristine [name] is a 74 y.o. female with prior h/o diverticulosis, CREST SYNDrome, barrett's esophagus, hypertension, CAD, presents to ED, after persistent diarrhea, associated with nausea, vomiting and loss of appetite. She had a recent bout of diverticulitis and finished keflex last Thursday ,but had persistent symptoms and was admitted to the hospital for colitis.   Assessment/Plan: 1. Acute colitis.  -  Started on IV zosyn , hydration, IV fluids and IV anti emetics.  - abd slightly distended, abd film ordered, which was not significant. C diff pcr was negative on admission, GI pathogen sent and pending.  - lactic acid normalized after 2 lit fluid boluses. - IV pepcid.  - requested [facility] gastroenterology for recommendations for some rectal bleeding associated with bowel movements since yesterday. She reports having a colonoscopy and EGD at [facility] and i have put in a request to get the records.  - she remains hemodynamically stable.  - drop in hemoglobin from 12 to 9.7 today, anemia panel pending.  She is afebrile and leukocytosis is improving. - diet advanced to full liquid, reported feeling better    2. Anemia of blood loss : Anemia panel ordered. Normocytic.  - per GI no need of urgent colonoscopy.    3. CREST syndrome, Raynaud's phenomenon; currently not on medications .   4. Hypertension: well controlled.    Code Status: full code.  Family Communication: patient Disposition Plan:  When able to tolerate a regular diet and when rectal bleeding stops. And cleared by GI.   Consultants: [facility] gastroenterology.   Procedures: CT abd and pelvis.  Antibiotics:  Zosyn 11/13  HPI/Subjective: Reported feeling much better, denies pain, tolerating full liquid diet   Objective: Filed Vitals:   10/11/15 1400  BP: 135/66  Pulse: 76  Temp: 98.2 F (36.8  C)  Resp: 18    Intake/Output Summary (Last 24 hours) at 10/11/15 1616 Last data filed at 10/11/15 1400  Gross per 24 hour  Intake   2955 ml  Output   1920 ml  Net   1035 ml   Filed Weights   10/08/15 1635  Weight: 130 lb (58.968 kg)    Exam:   General:  Alert afebrile comfortable  Cardiovascular: s1s2  Respiratory: ctab  Abdomen: soft gen tenderness in the lower quadrant,mild distention. Good bowel sounds.   Musculoskeletal: no pedal edema.   Data Reviewed: Basic Metabolic Panel:  Recent Labs Lab 10/08/15 1131 10/09/15 0535 10/10/15 0538  NA 142 138 138  K 4.1 3.7 3.5  CL 108 108 108  CO2 25 24 25   GLUCOSE 138* 114* 103*  BUN 22* 15 9  CREATININE 0.82 0.63 0.73  CALCIUM 9.5 8.5* 8.5*  MG  --  2.0  --    Liver Function Tests:  Recent Labs Lab 10/08/15 1131  AST 25  ALT 19  ALKPHOS 69  BILITOT 0.5  PROT 6.7  ALBUMIN 3.9   No results for input(s): LIPASE, AMYLASE in the last 168 hours. No results for input(s): AMMONIA in the last 168 hours. CBC:  Recent Labs Lab 10/08/15 1131 10/09/15 0535 10/10/15 0538 10/11/15 0015 10/11/15 0503  WBC 32.9* 16.3* 10.7* 11.3* 9.2  NEUTROABS 29.0*  --   --  7.8*  --   HGB 12.3 11.1* 9.7* 9.6* 9.4*  HCT 39.2 35.4* 31.7* 31.5* 30.6*  MCV 81.2 80.5 81.3 82.5 81.0  PLT 228 174 141* 144*  126*   Cardiac Enzymes: No results for input(s): CKTOTAL, CKMB, CKMBINDEX, TROPONINI in the last 168 hours. BNP (last 3 results) No results for input(s): BNP in the last 8760 hours.  ProBNP (last 3 results) No results for input(s): PROBNP in the last 8760 hours.  CBG: No results for input(s): GLUCAP in the last 168 hours.  Recent Results (from the past 240 hour(s))  C difficile quick scan w PCR reflex     Status: None   Collection Time: 10/08/15 12:01 PM  Result Value Ref Range Status   C Diff antigen NEGATIVE NEGATIVE Final   C Diff toxin NEGATIVE NEGATIVE Final   C Diff interpretation Negative for toxigenic C.  difficile  Final     Studies: Dg Abd 2 Views  10/09/2015  CLINICAL DATA:  Abdominal distention, nausea, vomiting and diarrhea since yesterday. EXAM: ABDOMEN - 2 VIEW COMPARISON:  CT scan 10/08/2015 FINDINGS: The lung bases are grossly clear. Contrast noted in the right colon from the previous abdominal CT scan. No findings for obstruction or perforation. The soft tissue shadows are grossly maintained. The bony structures are intact. IMPRESSION: No plain film findings for an acute abdominal process. Electronically Signed   By: Marijo Sanes M.D.   On: 10/09/2015 19:08    Scheduled Meds: . famotidine (PEPCID) IV  20 mg Intravenous Q12H  . piperacillin-tazobactam (ZOSYN)  IV  3.375 g Intravenous Q8H  . sodium chloride  1,000 mL Intravenous Once   Continuous Infusions: . sodium chloride 75 mL/hr at 10/10/15 [id number]    Active Problems:   GERD (gastroesophageal reflux disease)   Barrett's esophagus   Diverticulitis   Hypertension   Colitis   Acute blood loss anemia    Time spent: 25 min    Adarsh Mundorf MD PhD  [facility] Pager [phone]  If 7PM-7AM, please contact night-coverage at [email or web address], password [facility] 10/11/2015, 4:16 PM  LOS: 3 days

## 2015-10-12 ENCOUNTER — Telehealth: Payer: Self-pay

## 2015-10-12 ENCOUNTER — Other Ambulatory Visit: Payer: Self-pay | Admitting: Cardiovascular Disease

## 2015-10-12 LAB — BASIC METABOLIC PANEL
Anion gap: 6 (ref 5–15)
BUN: 11 mg/dL (ref 6–20)
CALCIUM: 8.8 mg/dL — AB (ref 8.9–10.3)
CHLORIDE: 105 mmol/L (ref 101–111)
CO2: 26 mmol/L (ref 22–32)
CREATININE: 0.7 mg/dL (ref 0.44–1.00)
GFR calc non Af Amer: >60 mL/min (ref >60)
Glucose, Bld: 106 mg/dL — ABNORMAL HIGH (ref 65–99)
Potassium: 3.7 mmol/L (ref 3.5–5.1)
SODIUM: 137 mmol/L (ref 135–145)

## 2015-10-12 LAB — CBC
HCT: 31 % — ABNORMAL LOW (ref 36.0–46.0)
Hemoglobin: 9.7 g/dL — ABNORMAL LOW (ref 12.0–15.0)
MCH: 25.7 pg — AB (ref 26.0–34.0)
MCHC: 31.3 g/dL (ref 30.0–36.0)
MCV: 82 fL (ref 78.0–100.0)
Platelets: 158 10*3/uL (ref 150–400)
RBC: 3.78 MIL/uL — ABNORMAL LOW (ref 3.87–5.11)
RDW: 16.2 % — AB (ref 11.5–15.5)
WBC: 9 10*3/uL (ref 4.0–10.5)

## 2015-10-12 LAB — MAGNESIUM: Magnesium: 1.8 mg/dL (ref 1.7–2.4)

## 2015-10-12 MED ORDER — LOSARTAN POTASSIUM 50 MG PO TABS: 25.0000 mg | ORAL_TABLET | Freq: Every day | ORAL | Status: DC

## 2015-10-12 MED ORDER — METRONIDAZOLE 500 MG PO TABS: 500.0000 mg | ORAL_TABLET | Freq: Three times a day (TID) | ORAL | Status: DC

## 2015-10-12 MED ORDER — FAMOTIDINE 20 MG PO TABS
20.0000 mg | ORAL_TABLET | Freq: Two times a day (BID) | ORAL | Status: DC
  Administered 2015-10-12: 20 mg via ORAL
  Filled 2015-10-12 (×2): qty 1

## 2015-10-12 MED ORDER — CIPROFLOXACIN HCL 500 MG PO TABS: 500.0000 mg | ORAL_TABLET | Freq: Two times a day (BID) | ORAL | Status: DC

## 2015-10-12 NOTE — Telephone Encounter (Signed)
Patient is calling to let Dr. Everlene Farrier that she was discharged from [facility] today. She also wants to thank Dr. Everlene Farrier for his concern

## 2015-10-12 NOTE — Discharge Summary (Signed)
Discharge Summary  Kristine [name] [id number] DOB: [birth year]  PCP: Jenny Reichmann, MD  Admit date: 10/08/2015 Discharge date: 10/12/2015  Time spent: >43mins  Recommendations for Outpatient Follow-up:  1. F/u with PMD within a week for hospital discharge follow up 2. F/u with gastroenterology Dr. Amedeo Plenty for colitis and possible colonoscopy  Discharge Diagnoses:  Active Hospital Problems   Diagnosis Date Noted  . Acute blood loss anemia 10/10/2015  . Colitis 10/08/2015  . Hypertension   . Diverticulitis   . Barrett's esophagus   . GERD (gastroesophageal reflux disease)     Resolved Hospital Problems   Diagnosis Date Noted Date Resolved  No resolved problems to display.    Discharge Condition: stable  Diet recommendation: heart healthy, soft diet, advance as tolerated  Filed Weights   10/08/15 1635  Weight: 130 lb (58.968 kg)    History of present illness:  Kristine [name] is a 74 y.o. female with prior h/o diverticulosis, CREST SYNDrome, barrett's esophagus, hypertension, CAD, presents to ED, after persistent diarrhea, associated with nausea, vomiting and loss of appetite. She reports of having finished a course of keflex for diverticulitis for 10 days last Thursday and she continues to have the above symptoms. She denies hematochezia. Her lab work on arrival showed wbc count of 32.9 and c diff was negative. BUN was slightly high at 22. Lactic acid was 2.56. CT abdomen and pelvis revealed colitis in the transverse and descending colon. She was referred to medical service for admission with gastroenterology consult.  Hospital Course:  Active Problems:   GERD (gastroesophageal reflux disease)   Barrett's esophagus   Diverticulitis   Hypertension   Colitis   Acute blood loss anemia   Acute colitis. - presented with n/v/d and abdominal pain with leukocytosis and lactic acidosis -treated with  IV zosyn , hydration, IV fluids and IV anti emetics.  -  C diff pcr was  negative on admission, GI pathogen panel by pcr negative  -leukocytosis and  lactic acid normalized - she remains hemodynamically stable. and afebrile. - Gastroenterology input appreciated, recommended continue conservative management , patient is cleared to discharge on oral abx for additional 5 days ( cipro/fagyl) and outpatient follow up with Dr Amedeo Plenty for possible colonoscopy.    2. Anemia of blood loss : -did reported blood in stool, she did not require  Blood transfusion in the hospital. hgb has been stable around 9.7 for the last 4days, no more blood in stool at discharge - outpatient GI follow up   3. CREST syndrome, Raynaud's phenomenon; currently not on medications .   4. Hypertension: well controlled.   5. H/o CAD s/p MI in 2014, stable on home meds   Code Status: full code.  Family Communication: patient Disposition Plan: cleared by GI to discharge home on 11/17   Consultants: [facility] gastroenterology.   Procedures: CT abd and pelvis.   Antibiotics:  Zosyn 11/13 to 11/17   Discharge Exam: BP 140/61 mmHg  Pulse 91  Temp(Src) 97.8 F (36.6 C) (Oral)  Resp 16  Ht 5\' 1"  (1.549 m)  Wt 130 lb (58.968 kg)  BMI 24.58 kg/m2  SpO2 98%  LMP 11/25/1994   General: Alert afebrile comfortable  Cardiovascular: s1s2  Respiratory: ctab  Abdomen: soft , nontender, no distension. Good bowel sounds.   Musculoskeletal: no pedal edema.   Discharge Instructions You were cared for by a hospitalist during your hospital stay. If you have any questions about your discharge medications or the care you received while  you were in the hospital after you are discharged, you can call the unit and asked to speak with the hospitalist on call if the hospitalist that took care of you is not available. Once you are discharged, your primary care physician will handle any further medical issues. Please note that NO REFILLS for any discharge medications will be authorized once you  are discharged, as it is imperative that you return to your primary care physician (or establish a relationship with a primary care physician if you do not have one) for your aftercare needs so that they can reassess your need for medications and monitor your lab values.  Discharge Instructions    Diet - low sodium heart healthy    Complete by:  As directed   Soft diet, advance as tolerated     Increase activity slowly    Complete by:  As directed             Medication List    STOP taking these medications        cephALEXin 500 MG capsule  Commonly known as:  KEFLEX      TAKE these medications        ALPRAZolam 0.25 MG tablet  Commonly known as:  XANAX  Take 1/2-1 tablet as needed for anxiety     aspirin 81 MG EC tablet  Take 1 tablet (81 mg total) by mouth daily.     B-12 PO  Take 1 tablet by mouth daily.     ciprofloxacin 500 MG tablet  Commonly known as:  CIPRO  Take 1 tablet (500 mg total) by mouth 2 (two) times daily.     ezetimibe 10 MG tablet  Commonly known as:  ZETIA  Take 0.5 tablets (5 mg total) by mouth daily.     KRILL OIL PO  Take 1 capsule by mouth daily.     losartan 50 MG tablet  Commonly known as:  COZAAR  Take 0.5 tablets (25 mg total) by mouth daily.     metroNIDAZOLE 500 MG tablet  Commonly known as:  FLAGYL  Take 1 tablet (500 mg total) by mouth 3 (three) times daily.     nitroGLYCERIN 0.4 MG SL tablet  Commonly known as:  NITROSTAT  Place 1 tablet (0.4 mg total) under the tongue every 5 (five) minutes x 3 doses as needed for chest pain.     omeprazole 20 MG capsule  Commonly known as:  PRILOSEC  Take 20 mg by mouth 2 (two) times daily.     pravastatin 10 MG tablet  Commonly known as:  PRAVACHOL  Take 1 tablet (10 mg total) by mouth every evening.       Allergies  Allergen Reactions  . Aspirin Other (See Comments)    Pt is prone to bleeding, ok with low dose coated aspirin  . Nsaids Other (See Comments)    Pt is prone to  bleeding  . Pravastatin Sodium     Other reaction(s): Other (See Comments) elevated LFT'S, tolerates 1/2 dose of zetia and 1/2 dose of pravastatin  . Statins Other (See Comments)    elevated LFT'S, tolerates 1/2 dose of zetia and 1/2 dose of pravastatin       Follow-up Information    Follow up with HAYES,JOHN C, MD In 3 weeks.   Specialty:  Gastroenterology   Contact information:   [id number] [place] [address] [phone]       Follow up with DAUB, Richardson Landry  A, MD In 1 week.   Specialty:  Family Medicine   Why:  hospital discharge follow up   Contact information:   [place] [id number] [phone]        The results of significant diagnostics from this hospitalization (including imaging, microbiology, ancillary and laboratory) are listed below for reference.    Significant Diagnostic Studies: Ct Abdomen Pelvis W Contrast  10/08/2015  CLINICAL DATA:  Right lower abdominal pain with nausea and vomiting and diarrhea since early this morning. EXAM: CT ABDOMEN AND PELVIS WITH CONTRAST TECHNIQUE: Multidetector CT imaging of the abdomen and pelvis was performed using the standard protocol following bolus administration of intravenous contrast. CONTRAST:  183mL OMNIPAQUE IOHEXOL 300 MG/ML SOLN, 56mL OMNIPAQUE IOHEXOL 300 MG/ML SOLN COMPARISON:  08/19/2014 FINDINGS: Lung bases: Essentially clear. Heart is normal in size. Moderate size hiatal hernia. Liver, spleen, gallbladder: Unremarkable. Pancreas: 11 mm cystic area in the pancreatic head which may reflect an area dilation of the confluence of the common bile duct and pancreatic duct. This is stable from the prior CT. Pancreas otherwise unremarkable. Adrenal glands:  No masses. Kidneys, ureters, bladder:  Unremarkable. Lymph nodes:  No adenopathy. Ascites:  None. Gastrointestinal: There is wall thickening and adjacent inflammatory change which extends across the transverse colon through the descending  colon. There are several sigmoid and lower descending colon diverticula but no evidence of diverticulitis. Small bowel is unremarkable. Normal appendix visualized. Uterus is surgically absent.  No pelvic masses. Musculoskeletal: Degenerative changes throughout the visualized spine. Bones are demineralized. There is also a S-shaped scoliosis of the lumbar spine. IMPRESSION: 1. Wall thickening and hazy inflammatory change extends along the transverse and descending colon consistent with an infectious or inflammatory colitis. 2. No other acute findings. Electronically Signed   By: Lajean Manes M.D.   On: 10/08/2015 14:02   Dg Abd 2 Views  10/09/2015  CLINICAL DATA:  Abdominal distention, nausea, vomiting and diarrhea since yesterday. EXAM: ABDOMEN - 2 VIEW COMPARISON:  CT scan 10/08/2015 FINDINGS: The lung bases are grossly clear. Contrast noted in the right colon from the previous abdominal CT scan. No findings for obstruction or perforation. The soft tissue shadows are grossly maintained. The bony structures are intact. IMPRESSION: No plain film findings for an acute abdominal process. Electronically Signed   By: Marijo Sanes M.D.   On: 10/09/2015 19:08   Dg Abd Acute W/chest  09/26/2015  CLINICAL DATA:  Nausea and vomiting. EXAM: DG ABDOMEN ACUTE W/ 1V CHEST COMPARISON:  Chest x-ray 03/07/2015, 01/24/2015, 08/29/2014. FINDINGS: Mediastinum hilar structures normal. Lungs are clear of acute infiltrates. Left base pleural parenchymal thickening consistent with scarring. Soft tissue structures are unremarkable. Large amount of stool noted throughout the colon. Constipation cannot be excluded. Degenerative changes and scoliosis lumbar spine. Degenerative changes both hips. IMPRESSION: 1. Large amount stool noted throughout the colon suggesting constipation. No bowel distention. 2. No acute cardiopulmonary disease. Electronically Signed   By: Marcello Moores  Register   On: 09/26/2015 14:33    Microbiology: Recent  Results (from the past 240 hour(s))  C difficile quick scan w PCR reflex     Status: None   Collection Time: 10/08/15 12:01 PM  Result Value Ref Range Status   C Diff antigen NEGATIVE NEGATIVE Final   C Diff toxin NEGATIVE NEGATIVE Final   C Diff interpretation Negative for toxigenic C. difficile  Final     Labs: Basic Metabolic Panel:  Recent Labs Lab 10/08/15 1131 10/09/15 0535 10/10/15 0538 10/12/15  0444  NA 142 138 138 137  K 4.1 3.7 3.5 3.7  CL 108 108 108 105  CO2 25 24 25 26   GLUCOSE 138* 114* 103* 106*  BUN 22* 15 9 11   CREATININE 0.82 0.63 0.73 0.70  CALCIUM 9.5 8.5* 8.5* 8.8*  MG  --  2.0  --  1.8   Liver Function Tests:  Recent Labs Lab 10/08/15 1131  AST 25  ALT 19  ALKPHOS 69  BILITOT 0.5  PROT 6.7  ALBUMIN 3.9   No results for input(s): LIPASE, AMYLASE in the last 168 hours. No results for input(s): AMMONIA in the last 168 hours. CBC:  Recent Labs Lab 10/08/15 1131 10/09/15 0535 10/10/15 0538 10/11/15 0015 10/11/15 0503 10/12/15 0444  WBC 32.9* 16.3* 10.7* 11.3* 9.2 9.0  NEUTROABS 29.0*  --   --  7.8*  --   --   HGB 12.3 11.1* 9.7* 9.6* 9.4* 9.7*  HCT 39.2 35.4* 31.7* 31.5* 30.6* 31.0*  MCV 81.2 80.5 81.3 82.5 81.0 82.0  PLT 228 174 141* 144* 126* 158   Cardiac Enzymes: No results for input(s): CKTOTAL, CKMB, CKMBINDEX, TROPONINI in the last 168 hours. BNP: BNP (last 3 results) No results for input(s): BNP in the last 8760 hours.  ProBNP (last 3 results) No results for input(s): PROBNP in the last 8760 hours.  CBG: No results for input(s): GLUCAP in the last 168 hours.     SignedFlorencia Reasons MD, PhD  [facility] 10/12/2015, 11:04 AM

## 2015-10-12 NOTE — Progress Notes (Signed)
[facility] Progress Note  Subjective: Feels better, no new complaints stools more formed and nonbloody, minimal abdominal pain and gas.  Objective: Vital signs in last 24 hours: Temp:  [98.2 F (36.8 C)-99.2 F (37.3 C)] 98.9 F (37.2 C) (11/17 0500) Pulse Rate:  [73-76] 75 (11/17 0500) Resp:  [14-18] 14 (11/17 0500) BP: (135-137)/(56-66) 136/56 mmHg (11/17 0500) SpO2:  [97 %-100 %] 97 % (11/17 0500) Weight change:    PE: Unchanged  Lab Results: Results for orders placed or performed during the hospital encounter of 10/08/15 (from the past 24 hour(s))  CBC     Status: Abnormal   Collection Time: 10/12/15  4:44 AM  Result Value Ref Range   WBC 9.0 4.0 - 10.5 K/uL   RBC 3.78 (L) 3.87 - 5.11 MIL/uL   Hemoglobin 9.7 (L) 12.0 - 15.0 g/dL   HCT 31.0 (L) 36.0 - 46.0 %   MCV 82.0 78.0 - 100.0 fL   MCH 25.7 (L) 26.0 - 34.0 pg   MCHC 31.3 30.0 - 36.0 g/dL   RDW 16.2 (H) 11.5 - 15.5 %   Platelets 158 150 - 400 K/uL  Basic metabolic panel     Status: Abnormal   Collection Time: 10/12/15  4:44 AM  Result Value Ref Range   Sodium 137 135 - 145 mmol/L   Potassium 3.7 3.5 - 5.1 mmol/L   Chloride 105 101 - 111 mmol/L   CO2 26 22 - 32 mmol/L   Glucose, Bld 106 (H) 65 - 99 mg/dL   BUN 11 6 - 20 mg/dL   Creatinine, Ser 0.70 0.44 - 1.00 mg/dL   Calcium 8.8 (L) 8.9 - 10.3 mg/dL   GFR calc non Af Amer >60 >60 mL/min   GFR calc Af Amer >60 >60 mL/min   Anion gap 6 5 - 15  Magnesium     Status: None   Collection Time: 10/12/15  4:44 AM  Result Value Ref Range   Magnesium 1.8 1.7 - 2.4 mg/dL    Studies/Results: No results found.    Assessment: Segmental colitis, possibly ischemic, clinically significantly improved  Plan: Advance diet and okay to discharge later today. Will follow-up in my office in 2-3 weeks.    Cypress Hinkson C 10/12/2015, 8:42 AM  Pager [phone] If no answer or after 5 PM call [phone]

## 2015-10-12 NOTE — Progress Notes (Signed)
Dr. Erlinda Hong aware of IV access loss due to leaking at site. Status update given. See new orders entered into EPIC by MD.

## 2015-10-12 NOTE — Progress Notes (Signed)
Assessment unchanged. Pt verbalized understanding of dc instructions through teach back including follow up care and when to call the doctor. No scripts at dc. Discharged via wc to front entrance to meet awaiting vehicle to carry home. Accompanied by daughter and NT.

## 2015-10-20 ENCOUNTER — Encounter: Payer: Self-pay | Admitting: Emergency Medicine

## 2015-10-20 ENCOUNTER — Ambulatory Visit (INDEPENDENT_AMBULATORY_CARE_PROVIDER_SITE_OTHER): Payer: BLUE CROSS/BLUE SHIELD | Admitting: Emergency Medicine

## 2015-10-20 VITALS — BP 126/74 | HR 98 | Temp 98.7°F | Resp 16 | Ht 61.0 in | Wt 128.0 lb

## 2015-10-20 DIAGNOSIS — R1084 Generalized abdominal pain: Secondary | ICD-10-CM

## 2015-10-20 DIAGNOSIS — R630 Anorexia: Secondary | ICD-10-CM

## 2015-10-20 DIAGNOSIS — R195 Other fecal abnormalities: Secondary | ICD-10-CM

## 2015-10-20 NOTE — Progress Notes (Signed)
This chart was scribed for [name], MD by Thea Alken, ED Scribe. This patient was seen in room 22 and the patient's care was started at 10:33 AM.  Chief Complaint:  Chief Complaint  Patient presents with  . Hospitalization Follow-up    improving/ still has some abdominal cramps / finished Cipro/ and Flagyl   . Medication Management    wants to know if she should take Losartan am or pm and if she should use Hyoscamine Sulfate     HPI: Kristine [name] is a 74 y.o. female with hx of diverticulitis, Barrett's esophagus, GERD and HTN who reports to [facility] today for a hospitalization follow up. Pt was hospitalized for 4 days. Pt was seen here 11 days ago for severe lower abdominal pain with associated vomiting and diarrhea that began earlier that morning. At that time she had finished a 10 day coarse of keflex 3 days prior to that visit. She was sent to the ED where she was then admitted and was treated with IVF's. GI pathogen panel was negative. She had CT abdomen which revealed colitis of transverse and descending colon. Pt was discharged home with a 5 day coarse of cipro and was to follow up with Dr. Amedeo Plenty for possible colonoscopy.   Pt states she has improved since last visit. She has finished all abx. She still has some abdominal cramping, that she compares to menstrual cramps, during the night and first thing in the morning. She has Levsin but has not taken this yet. Pt is unsure if she is constipated but has not had many BM's. Pt has followed up with Dr. Amedeo Plenty 2 days ago and states he is testing her for anemia .  Past Medical History  Diagnosis Date  . GERD (gastroesophageal reflux disease)   . Barrett's esophagus     Stage B  . Cystocele   . Rectocele     mild  . Hypertension   . Diverticulitis   . Arthritis   . Raynaud disease   . CREST variant of scleroderma ([place])   . Osteopenia 09/2012    T score -2.4 FRAX 13%/2.7%  per 10/15/2012 discussion plan repeat in 2 years  . Anemia     . STEMI (ST elevation myocardial infarction) ([place]) 06/21/2013  . CAD (coronary artery disease)   . Hyperlipidemia    Past Surgical History  Procedure Laterality Date  . Tonsillectomy  1950  . Tubal ligation  1980  . Nissen fundoplication  123456  . Total knee arthroplasty      right  . Joint replacement  9/10    rt total  . Foot surgery  2013    x2  . Abdominal hysterectomy  1996    RSO for menorrhagia.  . Coronary angioplasty with stent placement  06/21/2013   Social History   Social History  . Marital Status: Divorced    Spouse Name: N/A  . Number of Children: N/A  . Years of Education: N/A   Social History Main Topics  . Smoking status: Never Smoker   . Smokeless tobacco: Never Used  . Alcohol Use: 0.0 oz/week    0 Standard drinks or equivalent per week     Comment: Occasional  . Drug Use: No  . Sexual Activity: No   Other Topics Concern  . Not on file   Social History Narrative   Exercise dancing 3 x times weekly for 1-2 hours   Family History  Problem Relation Age of Onset  .  Cancer Mother     Pancreatic Cancer  . Hepatitis C Mother   . Heart disease Father     congestive heart failure  . Panic disorder Daughter   . Asthma Maternal Grandmother   . Emphysema Maternal Grandmother   . Cancer Maternal Grandfather     lung  . Heart attack Father   . Stroke Neg Hx    Allergies  Allergen Reactions  . Aspirin Other (See Comments)    Pt is prone to bleeding, ok with low dose coated aspirin  . Nsaids Other (See Comments)    Pt is prone to bleeding  . Pravastatin Sodium     Other reaction(s): Other (See Comments) elevated LFT'S, tolerates 1/2 dose of zetia and 1/2 dose of pravastatin  . Statins Other (See Comments)    elevated LFT'S, tolerates 1/2 dose of zetia and 1/2 dose of pravastatin   Prior to Admission medications   Medication Sig Start Date End Date Taking? Authorizing Provider  ALPRAZolam (XANAX) 0.25 MG tablet Take 1/2-1 tablet as needed for  anxiety Patient taking differently: Take 0.25 mg by mouth 2 (two) times daily as needed for anxiety. Take 1/2-1 tablet as needed for anxiety 05/27/15   Darlyne Russian, MD  aspirin EC 81 MG EC tablet Take 1 tablet (81 mg total) by mouth daily. 06/24/13   Rhonda G Barrett, PA-C  ciprofloxacin (CIPRO) 500 MG tablet Take 1 tablet (500 mg total) by mouth 2 (two) times daily. 10/12/15   Florencia Reasons, MD  Cyanocobalamin (B-12 PO) Take 1 tablet by mouth daily.    Historical Provider, MD  ezetimibe (ZETIA) 10 MG tablet Take 0.5 tablets (5 mg total) by mouth daily. 09/26/14   [name], MD  KRILL OIL PO Take 1 capsule by mouth daily.    Historical Provider, MD  losartan (COZAAR) 50 MG tablet Take 0.5 tablets (25 mg total) by mouth daily. 10/12/15   Florencia Reasons, MD  metroNIDAZOLE (FLAGYL) 500 MG tablet Take 1 tablet (500 mg total) by mouth 3 (three) times daily. 10/12/15   Florencia Reasons, MD  nitroGLYCERIN (NITROSTAT) 0.4 MG SL tablet Place 1 tablet (0.4 mg total) under the tongue every 5 (five) minutes x 3 doses as needed for chest pain. 06/24/13   Rhonda G Barrett, PA-C  omeprazole (PRILOSEC) 20 MG capsule Take 20 mg by mouth 2 (two) times daily. 05/03/15 05/02/16  Historical Provider, MD  pravastatin (PRAVACHOL) 10 MG tablet TAKE 1 TABLET (10 MG TOTAL) BY MOUTH EVERY EVENING. 10/13/15   [name], MD     ROS: The patient denies fevers, chills, night sweats, unintentional weight loss, chest pain, palpitations, wheezing, dyspnea on exertion, nausea, vomiting dysuria, hematuria, melena, numbness, weakness, or tingling.   All other systems have been reviewed and were otherwise negative with the exception of those mentioned in the HPI and as above.    PHYSICAL EXAM: Filed Vitals:   10/20/15 1036  BP: 126/74  Pulse: 98  Temp: 98.7 F (37.1 C)  Resp: 16   Body mass index is 24.2 kg/(m^2).   General: Alert, no acute distress HEENT:  Normocephalic, atraumatic, oropharynx patent. Eye: Juliette Mangle [facility] Cardiovascular:   Regular rate and rhythm, no rubs murmurs or gallops.  No Carotid bruits, radial pulse intact. No pedal edema.  Respiratory: Clear to auscultation bilaterally.  No wheezes, rales, or rhonchi.  No cyanosis, no use of accessory musculature Abdominal: No organomegaly, abdomen is soft and non-tender, positive bowel sounds.  No masses. Musculoskeletal: Gait intact.  No edema, tenderness Skin: No rashes. Neurologic: Facial musculature symmetric. Psychiatric: Patient acts appropriately throughout our interaction. Lymphatic: No cervical or submandibular lymphadenopa   LABS:    EKG/XRAY:   Primary read interpreted by Dr. Everlene Farrier at [facility].   ASSESSMENT/PLAN: Patient looks great. Her exam is normal. She presented with acute onset of vomiting diarrhea with a white count of 30,000. She was discharged on Flagyl and Cipro and doing well. She has RE had one visit to GI and scheduled to see them again in about 3 weeks. Will fill out FMLA papers to keep her I work at least another week. I told her she could go ahead and try the Levsin prescribed by Dr. Amedeo Plenty and see if it helps.I personally performed the services described in this documentation, which was scribed in my presence. The recorded information has been reviewed and is accurate.   Gross sideeffects, risk and benefits, and alternatives of medications d/w patient. Patient is aware that all medications have potential sideeffects and we are unable to predict every sideeffect or drug-drug interaction that may occur.  [name] MD 10/20/2015 10:32 AM

## 2015-10-23 ENCOUNTER — Encounter: Payer: Self-pay | Admitting: Family Medicine

## 2015-10-27 ENCOUNTER — Ambulatory Visit (INDEPENDENT_AMBULATORY_CARE_PROVIDER_SITE_OTHER): Payer: BLUE CROSS/BLUE SHIELD | Admitting: Emergency Medicine

## 2015-10-27 VITALS — BP 120/62 | HR 40 | Temp 98.5°F | Resp 16 | Ht 61.0 in | Wt 131.2 lb

## 2015-10-27 DIAGNOSIS — J069 Acute upper respiratory infection, unspecified: Secondary | ICD-10-CM | POA: Diagnosis not present

## 2015-10-27 DIAGNOSIS — J029 Acute pharyngitis, unspecified: Secondary | ICD-10-CM | POA: Diagnosis not present

## 2015-10-27 LAB — POCT RAPID STREP A (OFFICE): Rapid Strep A Screen: NEGATIVE

## 2015-10-27 MED ORDER — FERROUS SULFATE 325 (65 FE) MG PO TABS: 325.0000 mg | ORAL_TABLET | Freq: Every day | ORAL | Status: DC

## 2015-10-27 NOTE — Patient Instructions (Signed)
Upper Respiratory Infection, Adult Most upper respiratory infections (URIs) are a viral infection of the air passages leading to the lungs. A URI affects the nose, throat, and upper air passages. The most common type of URI is nasopharyngitis and is typically referred to as "the common cold." URIs run their course and usually go away on their own. Most of the time, a URI does not require medical attention, but sometimes a bacterial infection in the upper airways can follow a viral infection. This is called a secondary infection. Sinus and middle ear infections are common types of secondary upper respiratory infections. Bacterial pneumonia can also complicate a URI. A URI can worsen asthma and chronic obstructive pulmonary disease (COPD). Sometimes, these complications can require emergency medical care and may be life threatening.  CAUSES Almost all URIs are caused by viruses. A virus is a type of germ and can spread from one person to another.  RISKS FACTORS You may be at risk for a URI if:   You smoke.   You have chronic heart or lung disease.  You have a weakened defense (immune) system.   You are very young or very old.   You have nasal allergies or asthma.  You work in crowded or poorly ventilated areas.  You work in health care facilities or schools. SIGNS AND SYMPTOMS  Symptoms typically develop 2-3 days after you come in contact with a cold virus. Most viral URIs last 7-10 days. However, viral URIs from the influenza virus (flu virus) can last 14-18 days and are typically more severe. Symptoms may include:   Runny or stuffy (congested) nose.   Sneezing.   Cough.   Sore throat.   Headache.   Fatigue.   Fever.   Loss of appetite.   Pain in your forehead, behind your eyes, and over your cheekbones (sinus pain).  Muscle aches.  DIAGNOSIS  Your health care provider may diagnose a URI by:  Physical exam.  Tests to check that your symptoms are not due to  another condition such as:  Strep throat.  Sinusitis.  Pneumonia.  Asthma. TREATMENT  A URI goes away on its own with time. It cannot be cured with medicines, but medicines may be prescribed or recommended to relieve symptoms. Medicines may help:  Reduce your fever.  Reduce your cough.  Relieve nasal congestion. HOME CARE INSTRUCTIONS   Take medicines only as directed by your health care provider.   Gargle warm saltwater or take cough drops to comfort your throat as directed by your health care provider.  Use a warm mist humidifier or inhale steam from a shower to increase air moisture. This may make it easier to breathe.  Drink enough fluid to keep your urine clear or pale yellow.   Eat soups and other clear broths and maintain good nutrition.   Rest as needed.   Return to work when your temperature has returned to normal or as your health care provider advises. You may need to stay home longer to avoid infecting others. You can also use a face mask and careful hand washing to prevent spread of the virus.  Increase the usage of your inhaler if you have asthma.   Do not use any tobacco products, including cigarettes, chewing tobacco, or electronic cigarettes. If you need help quitting, ask your health care provider. PREVENTION  The best way to protect yourself from getting a cold is to practice good hygiene.   Avoid oral or hand contact with people with cold   symptoms.   Wash your hands often if contact occurs.  There is no clear evidence that vitamin C, vitamin E, echinacea, or exercise reduces the chance of developing a cold. However, it is always recommended to get plenty of rest, exercise, and practice good nutrition.  SEEK MEDICAL CARE IF:   You are getting worse rather than better.   Your symptoms are not controlled by medicine.   You have chills.  You have worsening shortness of breath.  You have brown or red mucus.  You have yellow or brown nasal  discharge.  You have pain in your face, especially when you bend forward.  You have a fever.  You have swollen neck glands.  You have pain while swallowing.  You have white areas in the back of your throat. SEEK IMMEDIATE MEDICAL CARE IF:   You have severe or persistent:  Headache.  Ear pain.  Sinus pain.  Chest pain.  You have chronic lung disease and any of the following:  Wheezing.  Prolonged cough.  Coughing up blood.  A change in your usual mucus.  You have a stiff neck.  You have changes in your:  Vision.  Hearing.  Thinking.  Mood. MAKE SURE YOU:   Understand these instructions.  Will watch your condition.  Will get help right away if you are not doing well or get worse.   This information is not intended to replace advice given to you by your health care provider. Make sure you discuss any questions you have with your health care provider.   Document Released: 05/07/2001 Document Revised: 03/28/2015 Document Reviewed: 02/16/2014 Elsevier Interactive Patient Education 2016 Elsevier Inc.  

## 2015-10-27 NOTE — Progress Notes (Signed)
This chart was scribed for [name], MD by [name], Medical Scribe. This patient was seen in Room 12 and the patient's care was started 2:51 PM.  Chief Complaint:  Chief Complaint  Patient presents with  . Follow-up    abdominal pain     HPI: Kristine [name] is a 74 y.o. female who reports to [facility] today for follow up on abd pain.  Abd pain She has improved greatly from her abd pain. She's taken the muscle cramping medication at night with relief.   Illness Her daughter and grandchildren went over to her house for the holidays. She started feeling sick with sore throat and fatigue starting 2 days ago. She has coughs in the morning with slight phlegm. She denies fever.   Past Medical History  Diagnosis Date  . GERD (gastroesophageal reflux disease)   . Barrett's esophagus     Stage B  . Cystocele   . Rectocele     mild  . Hypertension   . Diverticulitis   . Arthritis   . Raynaud disease   . CREST variant of scleroderma ([place])   . Osteopenia 09/2012    T score -2.4 FRAX 13%/2.7%  per 10/15/2012 discussion plan repeat in 2 years  . Anemia   . STEMI (ST elevation myocardial infarction) ([place]) 06/21/2013  . CAD (coronary artery disease)   . Hyperlipidemia    Past Surgical History  Procedure Laterality Date  . Tonsillectomy  1950  . Tubal ligation  1980  . Nissen fundoplication  123456  . Total knee arthroplasty      right  . Joint replacement  9/10    rt total  . Foot surgery  2013    x2  . Abdominal hysterectomy  1996    RSO for menorrhagia.  . Coronary angioplasty with stent placement  06/21/2013   Social History   Social History  . Marital Status: Divorced    Spouse Name: N/A  . Number of Children: N/A  . Years of Education: N/A   Social History Main Topics  . Smoking status: Never Smoker   . Smokeless tobacco: Never Used  . Alcohol Use: 0.0 oz/week    0 Standard drinks or equivalent per week     Comment: Occasional  . Drug Use: No  . Sexual  Activity: No   Other Topics Concern  . None   Social History Narrative   Exercise dancing 3 x times weekly for 1-2 hours   Family History  Problem Relation Age of Onset  . Cancer Mother     Pancreatic Cancer  . Hepatitis C Mother   . Heart disease Father     congestive heart failure  . Panic disorder Daughter   . Asthma Maternal Grandmother   . Emphysema Maternal Grandmother   . Cancer Maternal Grandfather     lung  . Heart attack Father   . Stroke Neg Hx    Allergies  Allergen Reactions  . Aspirin Other (See Comments)    Pt is prone to bleeding, ok with low dose coated aspirin  . Nsaids Other (See Comments)    Pt is prone to bleeding  . Pravastatin Sodium     Other reaction(s): Other (See Comments) elevated LFT'S, tolerates 1/2 dose of zetia and 1/2 dose of pravastatin  . Statins Other (See Comments)    elevated LFT'S, tolerates 1/2 dose of zetia and 1/2 dose of pravastatin   Prior to Admission medications   Medication Sig Start  Date End Date Taking? Authorizing Provider  ALPRAZolam Duanne Moron) 0.25 MG tablet Take 1/2-1 tablet as needed for anxiety Patient not taking: Reported on 10/20/2015 05/27/15   Darlyne Russian, MD  aspirin EC 81 MG EC tablet Take 1 tablet (81 mg total) by mouth daily. 06/24/13   Rhonda G Barrett, PA-C  Cyanocobalamin (B-12 PO) Take 1 tablet by mouth daily.    Historical Provider, MD  ezetimibe (ZETIA) 10 MG tablet Take 0.5 tablets (5 mg total) by mouth daily. 09/26/14   [name], MD  KRILL OIL PO Take 1 capsule by mouth daily.    Historical Provider, MD  losartan (COZAAR) 50 MG tablet Take 0.5 tablets (25 mg total) by mouth daily. 10/12/15   Florencia Reasons, MD  nitroGLYCERIN (NITROSTAT) 0.4 MG SL tablet Place 1 tablet (0.4 mg total) under the tongue every 5 (five) minutes x 3 doses as needed for chest pain. 06/24/13   Rhonda G Barrett, PA-C  omeprazole (PRILOSEC) 20 MG capsule Take 20 mg by mouth 2 (two) times daily. 05/03/15 05/02/16  Historical Provider, MD    pravastatin (PRAVACHOL) 10 MG tablet TAKE 1 TABLET (10 MG TOTAL) BY MOUTH EVERY EVENING. 10/13/15   [name], MD     ROS:  Constitutional: negative for chills, fever, night sweats, weight changes; positive for fatigue HEENT: negative for vision changes, hearing loss, congestion, rhinorrhea, epistaxis, or sinus pressure; positive for sore throat Cardiovascular: negative for chest pain or palpitations Respiratory: negative for hemoptysis, wheezing, shortness of breath; positive for cough Abdominal: negative for abdominal pain, nausea, vomiting, diarrhea, or constipation Dermatological: negative for rash Neurologic: negative for headache, dizziness, or syncope All other systems reviewed and are otherwise negative with the exception to those above and in the HPI.  PHYSICAL EXAM: Filed Vitals:   10/27/15 1446  BP: 120/62  Pulse: 40  Temp: 98.5 F (36.9 C)  Resp: 16   Body mass index is 24.8 kg/(m^2).   General: Alert, no acute distress HEENT:  Normocephalic, atraumatic, oropharynx patent.; Somewhat hoarse, throat slightly red Eye: EOMI, PEERLDC Cardiovascular:  Regular rate and rhythm, no rubs murmurs or gallops.  No Carotid bruits, radial pulse intact. No pedal edema.  Respiratory: Clear to auscultation bilaterally.  No wheezes, rales, or rhonchi.  No cyanosis, no use of accessory musculature Abdominal: No organomegaly, abdomen is soft and non-tender, positive bowel sounds. No masses. Musculoskeletal: Gait intact. No edema, tenderness Skin: No rashes. Neurologic: Facial musculature symmetric. Psychiatric: Patient acts appropriately throughout our interaction.  Lymphatic: No cervical or submandibular lymphadenopathy Genitourinary/Anorectal: No acute findings   LABS: Results for orders placed or performed in visit on 10/27/15  POCT rapid strep A  Result Value Ref Range   Rapid Strep A Screen Negative Negative     EKG/XRAY:   Primary read interpreted by Dr. Everlene Farrier at  [facility].   ASSESSMENT/PLAN: * No one a day. She will take Centrum Silver 1 a day. She was given information regarding upper respiratory infections. She was placed out of work another week.I personally performed the services described in this documentation, which was scribed in my presence. The recorded information has been reviewed and is accurate.  By signing my name below, I, [name], attest that this documentation has been prepared under the direction and in the presence of [name], MD. Electronically Signed: [name], [place]. 10/27/2015 , 2:51 PM .    Gross sideeffects, risk and benefits, and alternatives of medications d/w patient. Patient is aware that all medications have potential  sideeffects and we are unable to predict every sideeffect or drug-drug interaction that may occur.  [name] MD 10/27/2015 2:51 PM

## 2015-10-31 ENCOUNTER — Emergency Department (HOSPITAL_COMMUNITY)
Admission: EM | Admit: 2015-10-31 | Discharge: 2015-11-01 | Disposition: A | Discharge status: Home / Self Care | Payer: BLUE CROSS/BLUE SHIELD | Attending: Emergency Medicine | Admitting: Emergency Medicine

## 2015-10-31 ENCOUNTER — Encounter (HOSPITAL_COMMUNITY): Payer: Self-pay

## 2015-10-31 DIAGNOSIS — K219 Gastro-esophageal reflux disease without esophagitis: Secondary | ICD-10-CM | POA: Diagnosis not present

## 2015-10-31 DIAGNOSIS — Z7982 Long term (current) use of aspirin: Secondary | ICD-10-CM | POA: Insufficient documentation

## 2015-10-31 DIAGNOSIS — I2510 Atherosclerotic heart disease of native coronary artery without angina pectoris: Secondary | ICD-10-CM | POA: Insufficient documentation

## 2015-10-31 DIAGNOSIS — M1990 Unspecified osteoarthritis, unspecified site: Secondary | ICD-10-CM | POA: Insufficient documentation

## 2015-10-31 DIAGNOSIS — I252 Old myocardial infarction: Secondary | ICD-10-CM | POA: Diagnosis not present

## 2015-10-31 DIAGNOSIS — I10 Essential (primary) hypertension: Secondary | ICD-10-CM | POA: Insufficient documentation

## 2015-10-31 DIAGNOSIS — D649 Anemia, unspecified: Secondary | ICD-10-CM | POA: Diagnosis not present

## 2015-10-31 DIAGNOSIS — Z87448 Personal history of other diseases of urinary system: Secondary | ICD-10-CM | POA: Insufficient documentation

## 2015-10-31 DIAGNOSIS — Z79899 Other long term (current) drug therapy: Secondary | ICD-10-CM | POA: Insufficient documentation

## 2015-10-31 DIAGNOSIS — K529 Noninfective gastroenteritis and colitis, unspecified: Secondary | ICD-10-CM | POA: Diagnosis not present

## 2015-10-31 DIAGNOSIS — E785 Hyperlipidemia, unspecified: Secondary | ICD-10-CM | POA: Insufficient documentation

## 2015-10-31 DIAGNOSIS — R109 Unspecified abdominal pain: Secondary | ICD-10-CM | POA: Diagnosis present

## 2015-10-31 MED ORDER — ONDANSETRON HCL 4 MG/2ML IJ SOLN
4.0000 mg | Freq: Once | INTRAMUSCULAR | Status: AC | PRN
  Administered 2015-11-01: 4 mg via INTRAVENOUS
  Filled 2015-10-31: qty 2

## 2015-10-31 NOTE — ED Notes (Addendum)
Pt complaining of severe abdominal pain and cramping. Endorses diarrhea, nausea and vomiting. No fever. Was seen here 2 weeks ago for the same symptoms. Denies blood in stool.  Daughter concerned that these symptoms are very similar to the symptoms that her grandmother had with pancreatic cancer.

## 2015-11-01 ENCOUNTER — Telehealth: Payer: Self-pay

## 2015-11-01 ENCOUNTER — Ambulatory Visit (INDEPENDENT_AMBULATORY_CARE_PROVIDER_SITE_OTHER): Payer: BLUE CROSS/BLUE SHIELD

## 2015-11-01 ENCOUNTER — Emergency Department (HOSPITAL_COMMUNITY): Payer: BLUE CROSS/BLUE SHIELD

## 2015-11-01 ENCOUNTER — Ambulatory Visit (INDEPENDENT_AMBULATORY_CARE_PROVIDER_SITE_OTHER): Payer: BLUE CROSS/BLUE SHIELD | Admitting: Emergency Medicine

## 2015-11-01 ENCOUNTER — Encounter (HOSPITAL_COMMUNITY): Payer: Self-pay

## 2015-11-01 VITALS — BP 116/68 | HR 99 | Temp 99.1°F | Resp 16 | Ht 61.0 in | Wt 130.0 lb

## 2015-11-01 DIAGNOSIS — R05 Cough: Secondary | ICD-10-CM

## 2015-11-01 DIAGNOSIS — R059 Cough, unspecified: Secondary | ICD-10-CM

## 2015-11-01 DIAGNOSIS — R0989 Other specified symptoms and signs involving the circulatory and respiratory systems: Secondary | ICD-10-CM

## 2015-11-01 LAB — COMPREHENSIVE METABOLIC PANEL
ALT: 35 U/L (ref 14–54)
AST: 24 U/L (ref 15–41)
Albumin: 4.1 g/dL (ref 3.5–5.0)
Alkaline Phosphatase: 124 U/L (ref 38–126)
Anion gap: 7 (ref 5–15)
BUN: 18 mg/dL (ref 6–20)
CO2: 28 mmol/L (ref 22–32)
Calcium: 9.8 mg/dL (ref 8.9–10.3)
Chloride: 104 mmol/L (ref 101–111)
Creatinine, Ser: 0.92 mg/dL (ref 0.44–1.00)
GFR calc Af Amer: >60 mL/min (ref >60)
GFR calc non Af Amer: >60 mL/min (ref >60)
Glucose, Bld: 128 mg/dL — ABNORMAL HIGH (ref 65–99)
Potassium: 4.2 mmol/L (ref 3.5–5.1)
Sodium: 139 mmol/L (ref 135–145)
Total Bilirubin: 0.3 mg/dL (ref 0.3–1.2)
Total Protein: 7.2 g/dL (ref 6.5–8.1)

## 2015-11-01 LAB — DIFFERENTIAL
BASOS ABS: 0 10*3/uL (ref 0.0–0.1)
Basophils Relative: 0 %
Eosinophils Absolute: 0.1 10*3/uL (ref 0.0–0.7)
Eosinophils Relative: 1 %
LYMPHS PCT: 10 %
Lymphs Abs: 1.3 10*3/uL (ref 0.7–4.0)
Monocytes Absolute: 1.1 10*3/uL — ABNORMAL HIGH (ref 0.1–1.0)
Monocytes Relative: 9 %
NEUTROS PCT: 80 %
Neutro Abs: 10.1 10*3/uL — ABNORMAL HIGH (ref 1.7–7.7)

## 2015-11-01 LAB — CBC
HEMATOCRIT: 39.1 % (ref 36.0–46.0)
Hemoglobin: 12 g/dL (ref 12.0–15.0)
MCH: 25.4 pg — ABNORMAL LOW (ref 26.0–34.0)
MCHC: 30.7 g/dL (ref 30.0–36.0)
MCV: 82.7 fL (ref 78.0–100.0)
PLATELETS: 206 10*3/uL (ref 150–400)
RBC: 4.73 MIL/uL (ref 3.87–5.11)
RDW: 15.9 % — AB (ref 11.5–15.5)
WBC: 13 10*3/uL — AB (ref 4.0–10.5)

## 2015-11-01 LAB — URINALYSIS, ROUTINE W REFLEX MICROSCOPIC
Bilirubin Urine: NEGATIVE
Glucose, UA: NEGATIVE mg/dL
Hgb urine dipstick: NEGATIVE
Ketones, ur: NEGATIVE mg/dL
Nitrite: NEGATIVE
Protein, ur: NEGATIVE mg/dL
Specific Gravity, Urine: 1.02 (ref 1.005–1.030)
pH: 5.5 (ref 5.0–8.0)

## 2015-11-01 LAB — URINE MICROSCOPIC-ADD ON: RBC / HPF: NONE SEEN RBC/hpf (ref 0–5)

## 2015-11-01 LAB — LIPASE, BLOOD: Lipase: 27 U/L (ref 11–51)

## 2015-11-01 LAB — C DIFFICILE QUICK SCREEN W PCR REFLEX
C DIFFICILE (CDIFF) TOXIN: NEGATIVE
C DIFFICLE (CDIFF) ANTIGEN: NEGATIVE
C Diff interpretation: NEGATIVE

## 2015-11-01 LAB — I-STAT CG4 LACTIC ACID, ED: Lactic Acid, Venous: 1.89 mmol/L (ref 0.5–2.0)

## 2015-11-01 MED ORDER — ALBUTEROL SULFATE HFA 108 (90 BASE) MCG/ACT IN AERS: 2.0000 | INHALATION_SPRAY | RESPIRATORY_TRACT | Status: DC | PRN

## 2015-11-01 MED ORDER — METRONIDAZOLE 500 MG PO TABS: 500.0000 mg | ORAL_TABLET | Freq: Three times a day (TID) | ORAL | Status: DC

## 2015-11-01 MED ORDER — LOPERAMIDE HCL 2 MG PO CAPS: 2.0000 mg | ORAL_CAPSULE | Freq: Four times a day (QID) | ORAL | Status: DC | PRN

## 2015-11-01 MED ORDER — ONDANSETRON HCL 4 MG/2ML IJ SOLN
4.0000 mg | Freq: Once | INTRAMUSCULAR | Status: AC
  Administered 2015-11-01: 4 mg via INTRAVENOUS
  Filled 2015-11-01: qty 2

## 2015-11-01 MED ORDER — METRONIDAZOLE 500 MG PO TABS
500.0000 mg | ORAL_TABLET | Freq: Once | ORAL | Status: AC
  Administered 2015-11-01: 500 mg via ORAL
  Filled 2015-11-01: qty 1

## 2015-11-01 MED ORDER — FENTANYL CITRATE (PF) 100 MCG/2ML IJ SOLN
50.0000 ug | Freq: Once | INTRAMUSCULAR | Status: AC
  Administered 2015-11-01: 50 ug via INTRAVENOUS
  Filled 2015-11-01: qty 2

## 2015-11-01 MED ORDER — SODIUM CHLORIDE 0.9 % IV SOLN
INTRAVENOUS | Status: DC
  Administered 2015-11-01: 03:00:00 via INTRAVENOUS

## 2015-11-01 MED ORDER — IOHEXOL 300 MG/ML  SOLN
25.0000 mL | Freq: Once | INTRAMUSCULAR | Status: AC | PRN
  Administered 2015-11-01: 25 mL via ORAL

## 2015-11-01 MED ORDER — CIPROFLOXACIN HCL 500 MG PO TABS
500.0000 mg | ORAL_TABLET | Freq: Once | ORAL | Status: AC
  Administered 2015-11-01: 500 mg via ORAL
  Filled 2015-11-01: qty 1

## 2015-11-01 MED ORDER — ONDANSETRON 4 MG PO TBDP
4.0000 mg | ORAL_TABLET | Freq: Three times a day (TID) | ORAL | Status: DC | PRN
Start: 2015-11-01 — End: 2015-11-03

## 2015-11-01 MED ORDER — ALBUTEROL SULFATE (2.5 MG/3ML) 0.083% IN NEBU
2.5000 mg | INHALATION_SOLUTION | Freq: Once | RESPIRATORY_TRACT | Status: AC
  Administered 2015-11-01: 2.5 mg via RESPIRATORY_TRACT

## 2015-11-01 MED ORDER — IOHEXOL 300 MG/ML  SOLN
100.0000 mL | Freq: Once | INTRAMUSCULAR | Status: AC | PRN
  Administered 2015-11-01: 100 mL via INTRAVENOUS

## 2015-11-01 MED ORDER — FLUCONAZOLE 150 MG PO TABS: 150.0000 mg | ORAL_TABLET | Freq: Once | ORAL | Status: DC

## 2015-11-01 MED ORDER — OXYCODONE-ACETAMINOPHEN 5-325 MG PO TABS: 1.0000 | ORAL_TABLET | Freq: Four times a day (QID) | ORAL | Status: DC | PRN

## 2015-11-01 MED ORDER — CIPROFLOXACIN HCL 500 MG PO TABS: 500.0000 mg | ORAL_TABLET | Freq: Two times a day (BID) | ORAL | Status: DC

## 2015-11-01 NOTE — ED Notes (Signed)
Gone to CT

## 2015-11-01 NOTE — Telephone Encounter (Signed)
Final diagnosis     Colitis  Delice Bison Ward, DO 11/01/2015 7:38 AM Attending Provider Emergency Medicine      ED Disposition     Discharge The patient appears reasonably screened and/or stabilized for discharge and I doubt any other medical condition or other [facility] requiring further screening, evaluation, or treatment in the ED exists or is present at this time

## 2015-11-01 NOTE — Telephone Encounter (Addendum)
Pt states the FMLA we had sent earlier, her HR stated all Dr Everlene Farrier need to do is cross off the old dates and put her going back to work on 11/27/2015 and fax to them, they will accept it Please call pt at [phone] when done

## 2015-11-01 NOTE — Telephone Encounter (Signed)
Pt is being released from the hospital today 12/7-the answering service didn't say about what. Pt would like a CB from Dr. Laney Pastor. Please advise at [phone]

## 2015-11-01 NOTE — ED Notes (Signed)
Bedside toilet cleaned out.  NAD.  Pt reports pain meds working.

## 2015-11-01 NOTE — ED Notes (Signed)
Patient d/c'd self care.  F/U and medications reviewed.  Patient verbalized understanding. 

## 2015-11-01 NOTE — ED Notes (Signed)
CT aware pt has finished contrast and is ready for CAT scan.

## 2015-11-01 NOTE — ED Notes (Signed)
Pt unable to obtain urine specimen at this time.  Will try again later.

## 2015-11-01 NOTE — ED Notes (Signed)
Stool culture walked down to the lab.

## 2015-11-01 NOTE — Discharge Instructions (Signed)
Colitis Colitis is inflammation of the colon. Colitis may last a short time (acute) or it may last a long time (chronic). CAUSES This condition may be caused by:  Viruses.  Bacteria.  Reactions to medicine.  Certain autoimmune diseases, such as Crohn disease or ulcerative colitis. SYMPTOMS Symptoms of this condition include:  Diarrhea.  Passing bloody or tarry stool.  Pain.  Fever.  Vomiting.  Tiredness (fatigue).  Weight loss.  Bloating.  Sudden increase in abdominal pain.  Having fewer bowel movements than usual. DIAGNOSIS This condition is diagnosed with a stool test or a blood test. You may also have other tests, including X-rays, a CT scan, or a colonoscopy. TREATMENT Treatment may include:  Resting the bowel. This involves not eating or drinking for a period of time.  Fluids that are given through an IV tube.  Medicine for pain and diarrhea.  Antibiotic medicines.  Cortisone medicines.  Surgery. HOME CARE INSTRUCTIONS Eating and Drinking  Follow instructions from your health care provider about eating or drinking restrictions.  Drink enough fluid to keep your urine clear or pale yellow.  Work with a dietitian to determine which foods cause your condition to flare up.  Avoid foods that cause flare-ups.  Eat a well-balanced diet. Medicines  Take over-the-counter and prescription medicines only as told by your health care provider.  If you were prescribed an antibiotic medicine, take it as told by your health care provider. Do not stop taking the antibiotic even if you start to feel better. General Instructions  Keep all follow-up visits as told by your health care provider. This is important. SEEK MEDICAL CARE IF:  Your symptoms do not go away.  You develop new symptoms. SEEK IMMEDIATE MEDICAL CARE IF:  You have a fever that does not go away with treatment.  You develop chills.  You have extreme weakness, fainting, or  dehydration.  You have repeated vomiting.  You develop severe pain in your abdomen.  You pass bloody or tarry stool.   This information is not intended to replace advice given to you by your health care provider. Make sure you discuss any questions you have with your health care provider.   Document Released: 12/19/2004 Document Revised: 08/02/2015 Document Reviewed: 03/06/2015 Elsevier Interactive Patient Education 2016 [place] Choices to Help Relieve Diarrhea, Adult When you have diarrhea, the foods you eat and your eating habits are very important. Choosing the right foods and drinks can help relieve diarrhea. Also, because diarrhea can last up to 7 days, you need to replace lost fluids and electrolytes (such as sodium, potassium, and chloride) in order to help prevent dehydration.  WHAT GENERAL GUIDELINES DO I NEED TO FOLLOW?  Slowly drink 1 cup (8 oz) of fluid for each episode of diarrhea. If you are getting enough fluid, your urine will be clear or pale yellow.  Eat starchy foods. Some good choices include white rice, white toast, pasta, low-fiber cereal, baked potatoes (without the skin), saltine crackers, and bagels.  Avoid large servings of any cooked vegetables.  Limit fruit to two servings per day. A serving is  cup or 1 small piece.  Choose foods with less than 2 g of fiber per serving.  Limit fats to less than 8 tsp (38 g) per day.  Avoid fried foods.  Eat foods that have probiotics in them. Probiotics can be found in certain dairy products.  Avoid foods and beverages that may increase the speed at which food moves through the  stomach and intestines (gastrointestinal tract). Things to avoid include:  High-fiber foods, such as dried fruit, raw fruits and vegetables, nuts, seeds, and whole grain foods.  Spicy foods and high-fat foods.  Foods and beverages sweetened with high-fructose corn syrup, honey, or sugar alcohols such as xylitol, sorbitol, and  mannitol. WHAT FOODS ARE RECOMMENDED? Grains White rice. White, [place], or pita breads (fresh or toasted), including plain rolls, buns, or bagels. White pasta. Saltine, soda, or graham crackers. Pretzels. Low-fiber cereal. Cooked cereals made with water (such as cornmeal, farina, or cream cereals). Plain muffins. Matzo. Melba toast. Zwieback.  Vegetables Potatoes (without the skin). Strained tomato and vegetable juices. Most well-cooked and canned vegetables without seeds. Tender lettuce. Fruits Cooked or canned applesauce, apricots, cherries, fruit cocktail, grapefruit, peaches, pears, or plums. Fresh bananas, apples without skin, cherries, grapes, cantaloupe, grapefruit, peaches, oranges, or plums.  Meat and Other Protein Products Baked or boiled chicken. Eggs. Tofu. Fish. Seafood. Smooth peanut butter. Ground or well-cooked tender beef, ham, veal, lamb, pork, or poultry.  Dairy Plain yogurt, kefir, and unsweetened liquid yogurt. Lactose-free milk, buttermilk, or soy milk. Plain hard cheese. Beverages Sport drinks. Clear broths. Diluted fruit juices (except prune). Regular, caffeine-free sodas such as ginger ale. Water. Decaffeinated teas. Oral rehydration solutions. Sugar-free beverages not sweetened with sugar alcohols. Other Bouillon, broth, or soups made from recommended foods.  The items listed above may not be a complete list of recommended foods or beverages. Contact your dietitian for more options. WHAT FOODS ARE NOT RECOMMENDED? Grains Whole grain, whole wheat, bran, or rye breads, rolls, pastas, crackers, and cereals. Wild or brown rice. Cereals that contain more than 2 g of fiber per serving. Corn tortillas or taco shells. Cooked or dry oatmeal. Granola. Popcorn. Vegetables Raw vegetables. Cabbage, broccoli, Brussels sprouts, artichokes, baked beans, beet greens, corn, kale, legumes, peas, sweet potatoes, and yams. Potato skins. Cooked spinach and cabbage. Fruits Dried fruit,  including raisins and dates. Raw fruits. Stewed or dried prunes. Fresh apples with skin, apricots, mangoes, pears, raspberries, and strawberries.  Meat and Other Protein Products Chunky peanut butter. Nuts and seeds. Beans and lentils. Berniece Salines.  Dairy High-fat cheeses. Milk, chocolate milk, and beverages made with milk, such as milk shakes. Cream. Ice cream. Sweets and Desserts Sweet rolls, doughnuts, and sweet breads. Pancakes and waffles. Fats and Oils Butter. Cream sauces. Margarine. Salad oils. Plain salad dressings. Olives. Avocados.  Beverages Caffeinated beverages (such as coffee, tea, soda, or energy drinks). Alcoholic beverages. Fruit juices with pulp. Prune juice. Soft drinks sweetened with high-fructose corn syrup or sugar alcohols. Other Coconut. Hot sauce. Chili powder. Mayonnaise. Gravy. Cream-based or milk-based soups.  The items listed above may not be a complete list of foods and beverages to avoid. Contact your dietitian for more information. WHAT SHOULD I DO IF I BECOME DEHYDRATED? Diarrhea can sometimes lead to dehydration. Signs of dehydration include dark urine and dry mouth and skin. If you think you are dehydrated, you should rehydrate with an oral rehydration solution. These solutions can be purchased at pharmacies, retail stores, or online.  Drink -1 cup (120-240 mL) of oral rehydration solution each time you have an episode of diarrhea. If drinking this amount makes your diarrhea worse, try drinking smaller amounts more often. For example, drink 1-3 tsp (5-15 mL) every 5-10 minutes.  A general rule for staying hydrated is to drink 1-2 L of fluid per day. Talk to your health care provider about the specific amount you should be drinking each day.  Drink enough fluids to keep your urine clear or pale yellow.   This information is not intended to replace advice given to you by your health care provider. Make sure you discuss any questions you have with your health care  provider.   Document Released: 02/01/2004 Document Revised: 12/02/2014 Document Reviewed: 10/04/2013 Elsevier Interactive Patient Education [employer].

## 2015-11-01 NOTE — Telephone Encounter (Signed)
Spoke with pt, she and Dr. Everlene Farrier spoke already.

## 2015-11-01 NOTE — Progress Notes (Addendum)
Patient ID: Kristine [name], female   DOB: 1941-03-17, 74 y.o.   MRN: [id number]     By signing my name below, I, Kristine [name], attest that this documentation has been prepared under the direction and in the presence of [name], MD.  Electronically Signed: Zola [name], Medical Scribe. 11/01/2015. 2:15 PM.   Chief Complaint:  Chief Complaint  Patient presents with  . chest congestion    x 5 days   . Nasal Congestion  . Ulcerative Colitis    severe, pt. was in hospital yesterday     HPI: Kristine [name] is a 74 y.o. female with a history of GERD, Barrett's esophagus, diverticulitis, and colitis who reports to [facility] today for a follow-up. Patient was seen in the ED last night for ulcerative colitis. She had been hospitalized for ulcerative colitis about 3 weeks prior to that. She did follow-up with Dr. Amedeo Plenty after her initial hospitalization, but did not have a colonoscopy then. Patient states she her symptoms were triggered by eating the wrong foods yesterday. Around 5:00 PM yesterday, she ate a salad and 3 slices of bacon with bread and mayonnaise. About 1.5 hours later, she began having severe, cramping abdominal pain and nausea, then began vomiting later. She then went to the hospital later that night and then began having diarrhea while she was there. She did not have a CXR done there, but did have a CT Abdomen Pelvis done which showed colitis. She currently feels fatigued and generally weak. Patient notes she has had to use an inhaler before when she had pneumonia.  Patient also reports having chest congestion that started about 5 days ago. She has tried guaifenesin but without relief.   Past Medical History  Diagnosis Date  . GERD (gastroesophageal reflux disease)   . Barrett's esophagus     Stage B  . Cystocele   . Rectocele     mild  . Hypertension   . Diverticulitis   . Arthritis   . Raynaud disease   . CREST variant of scleroderma ([place])   . Osteopenia 09/2012    T score -2.4 FRAX  13%/2.7%  per 10/15/2012 discussion plan repeat in 2 years  . Anemia   . STEMI (ST elevation myocardial infarction) ([place]) 06/21/2013  . CAD (coronary artery disease)   . Hyperlipidemia    Past Surgical History  Procedure Laterality Date  . Tonsillectomy  1950  . Tubal ligation  1980  . Nissen fundoplication  123456  . Total knee arthroplasty      right  . Joint replacement  9/10    rt total  . Foot surgery  2013    x2  . Abdominal hysterectomy  1996    RSO for menorrhagia.  . Coronary angioplasty with stent placement  06/21/2013   Social History   Social History  . Marital Status: Divorced    Spouse Name: N/A  . Number of Children: N/A  . Years of Education: N/A   Social History Main Topics  . Smoking status: Never Smoker   . Smokeless tobacco: Never Used  . Alcohol Use: 0.0 oz/week    0 Standard drinks or equivalent per week     Comment: Occasional  . Drug Use: No  . Sexual Activity: No   Other Topics Concern  . None   Social History Narrative   Exercise dancing 3 x times weekly for 1-2 hours   Family History  Problem Relation Age of Onset  . Cancer Mother  Pancreatic Cancer  . Hepatitis C Mother   . Heart disease Father     congestive heart failure  . Panic disorder Daughter   . Asthma Maternal Grandmother   . Emphysema Maternal Grandmother   . Cancer Maternal Grandfather     lung  . Heart attack Father   . Stroke Neg Hx    Allergies  Allergen Reactions  . Aspirin Other (See Comments)    Pt is prone to bleeding, ok with low dose coated aspirin  . Nsaids Other (See Comments)    Pt is prone to bleeding  . Pravastatin Sodium     Other reaction(s): Other (See Comments) elevated LFT'S, tolerates 1/2 dose of zetia and 1/2 dose of pravastatin  . Statins Other (See Comments)    elevated LFT'S, tolerates 1/2 dose of zetia and 1/2 dose of pravastatin   Prior to Admission medications   Medication Sig Start Date End Date Taking? Authorizing Provider    aspirin EC 81 MG EC tablet Take 1 tablet (81 mg total) by mouth daily. 06/24/13   Rhonda G Barrett, PA-C  ciprofloxacin (CIPRO) 500 MG tablet Take 1 tablet (500 mg total) by mouth 2 (two) times daily. 11/01/15   Kristen N Ward, DO  Cyanocobalamin (B-12 PO) Take 1 tablet by mouth daily.    Historical Provider, MD  ezetimibe (ZETIA) 10 MG tablet Take 0.5 tablets (5 mg total) by mouth daily. 09/26/14   [name], MD  ferrous sulfate 325 (65 FE) MG tablet Take 1 tablet (325 mg total) by mouth daily with breakfast. 10/27/15   Darlyne Russian, MD  fluconazole (DIFLUCAN) 150 MG tablet Take 1 tablet (150 mg total) by mouth once. 11/01/15   Kristen N Ward, DO  guaifenesin (HUMIBID E) 400 MG TABS tablet Take 400 mg by mouth every 4 (four) hours. 10/31/15 11/01/15  Historical Provider, MD  hyoscyamine (ANASPAZ) 0.125 MG TBDP disintergrating tablet Take 1 tablet by mouth every 4 (four) hours. 10/17/15   Historical Provider, MD  KRILL OIL PO Take 1 capsule by mouth daily.    Historical Provider, MD  loperamide (IMODIUM) 2 MG capsule Take 1 capsule (2 mg total) by mouth 4 (four) times daily as needed for diarrhea or loose stools. 11/01/15   Kristen N Ward, DO  losartan (COZAAR) 50 MG tablet Take 0.5 tablets (25 mg total) by mouth daily. 10/12/15   Florencia Reasons, MD  metroNIDAZOLE (FLAGYL) 500 MG tablet Take 1 tablet (500 mg total) by mouth 3 (three) times daily. 11/01/15   Kristen N Ward, DO  Multiple Vitamin (MULTIVITAMIN WITH MINERALS) TABS tablet Take 1 tablet by mouth daily.    Historical Provider, MD  nitroGLYCERIN (NITROSTAT) 0.4 MG SL tablet Place 1 tablet (0.4 mg total) under the tongue every 5 (five) minutes x 3 doses as needed for chest pain. 06/24/13   Rhonda G Barrett, PA-C  omeprazole (PRILOSEC) 20 MG capsule Take 20 mg by mouth 2 (two) times daily. 05/03/15 05/02/16  Historical Provider, MD  ondansetron (ZOFRAN ODT) 4 MG disintegrating tablet Take 1 tablet (4 mg total) by mouth every 8 (eight) hours as needed for  nausea or vomiting. 11/01/15   Kristen N Ward, DO  oxyCODONE-acetaminophen (PERCOCET/ROXICET) 5-325 MG tablet Take 1 tablet by mouth every 6 (six) hours as needed. 11/01/15   Kristen N Ward, DO  pravastatin (PRAVACHOL) 10 MG tablet TAKE 1 TABLET (10 MG TOTAL) BY MOUTH EVERY EVENING. 10/13/15   [name], MD     ROS: The  patient denies fevers, chills, night sweats, unintentional weight loss, chest pain, palpitations, wheezing, dyspnea on exertion, dysuria, hematuria, melena, numbness, or tingling.  All other systems have been reviewed and were otherwise negative with the exception of those mentioned in the HPI and as above.    PHYSICAL EXAM: Filed Vitals:   11/01/15 1345  BP: 116/68  Pulse: 99  Temp: 99.1 F (37.3 C)  Resp: 16   Body mass index is 24.58 kg/(m^2).   General: Alert, no acute distress HEENT:  Normocephalic, atraumatic, oropharynx patent. Eye: Juliette Mangle [facility] Cardiovascular:  Regular rate and rhythm, no rubs murmurs or gallops.  No Carotid bruits, radial pulse intact. No pedal edema.  Respiratory: Clear to auscultation bilaterally.  Hoarseness to her voice. Abdominal: Mild discomfort to the suprapubic area. Musculoskeletal: Gait intact. No edema, tenderness Skin: No rashes. Neurologic: Facial musculature symmetric. Psychiatric: Patient acts appropriately throughout our interaction. Lymphatic: No cervical or submandibular lymphadenopathy    LABS:    EKG/XRAY:   Primary read interpreted by Dr. Everlene Farrier at [facility]. CXR - mildly increased AP diameter. Question air trapping.   ASSESSMENT/PLAN: Patient had a visit to the ER with her  CT showing persistent colitis She continues to struggle with allergic type symptoms. Chest x-ray was unremarkable. She did improve some with an albuterol nebulizer treatment. I did prescribe an albuterol inhaler. She will continue Flagyl and Cipro. I called Dr. Doristine Locks office to see if we can get her in in the near future.I personally  performed the services described in this documentation, which was scribed in my presence. The recorded information has been reviewed and is accurate.  Gross sideeffects, risk and benefits, and alternatives of medications d/w patient. Patient is aware that all medications have potential sideeffects and we are unable to predict every sideeffect or drug-drug interaction that may occur.  [name] MD 11/01/2015 2:15 PM

## 2015-11-01 NOTE — ED Notes (Signed)
Pt is done with CT contrast.

## 2015-11-01 NOTE — ED Provider Notes (Signed)
By signing my name below, I, Helane Gunther, attest that this documentation has been prepared under the direction and in the presence of [employer], DO. Electronically Signed: Helane Gunther, ED Scribe. 11/01/2015. 2:59 AM.  TIME SEEN: 2:34 AM  CHIEF COMPLAINT: Abdominal Pain  HPI: Kristine [name] is a 74 y.o. female with a PMHx of GERD, Diverticulitis, HTN, HLD, CAD who presents to the Emergency Department complaining of "severe" cramping, abdominal pain onset earlier tonight. She states the pain feels like "severe menstrual cramps." She reports associated n/v/d. She notes she was seen in the ED for the same in addition to bloody stool 2 weeks ago, when she was diagnosed with colitis. No bloody stools or melena today. She notes she was on antibiotics while in the hospital. It appears she was on IV Zosyn and discharged on Cipro and Flagyl which she is no longer taking. She also states she is currently having cold symptoms. She reports a PSHx of tubal ligation and partial hysterectomy. She notes she has not had a colonoscopy done since then. She states she canceled the nissen fundulation surgery she was supposed to have done. She notes she is scheduled to see Dr Amedeo Plenty on 12/30. Patient denies any fever. Did have one episode of vomiting on the way to the emergency department.  ROS: See HPI Constitutional: no fever  Eyes: no drainage  ENT: no runny nose   Cardiovascular:  no chest pain  Resp: no SOB  GI: n/v/d  GU: no dysuria Integumentary: no rash  Allergy: no hives  Musculoskeletal: no leg swelling  Neurological: no slurred speech ROS otherwise negative  PAST MEDICAL HISTORY/PAST SURGICAL HISTORY:  Past Medical History  Diagnosis Date  . GERD (gastroesophageal reflux disease)   . Barrett's esophagus     Stage B  . Cystocele   . Rectocele     mild  . Hypertension   . Diverticulitis   . Arthritis   . Raynaud disease   . CREST variant of scleroderma ([place])   . Osteopenia 09/2012     T score -2.4 FRAX 13%/2.7%  per 10/15/2012 discussion plan repeat in 2 years  . Anemia   . STEMI (ST elevation myocardial infarction) ([place]) 06/21/2013  . CAD (coronary artery disease)   . Hyperlipidemia     MEDICATIONS:  Prior to Admission medications   Medication Sig Start Date End Date Taking? Authorizing Provider  aspirin EC 81 MG EC tablet Take 1 tablet (81 mg total) by mouth daily. 06/24/13  Yes Rhonda G Barrett, PA-C  Cyanocobalamin (B-12 PO) Take 1 tablet by mouth daily.   Yes Historical Provider, MD  ezetimibe (ZETIA) 10 MG tablet Take 0.5 tablets (5 mg total) by mouth daily. 09/26/14  Yes [name], MD  ferrous sulfate 325 (65 FE) MG tablet Take 1 tablet (325 mg total) by mouth daily with breakfast. 10/27/15  Yes Darlyne Russian, MD  hyoscyamine (ANASPAZ) 0.125 MG TBDP disintergrating tablet Take 1 tablet by mouth every 4 (four) hours. 10/17/15  Yes Historical Provider, MD  KRILL OIL PO Take 1 capsule by mouth daily.   Yes Historical Provider, MD  losartan (COZAAR) 50 MG tablet Take 0.5 tablets (25 mg total) by mouth daily. 10/12/15  Yes Florencia Reasons, MD  Multiple Vitamin (MULTIVITAMIN WITH MINERALS) TABS tablet Take 1 tablet by mouth daily.   Yes Historical Provider, MD  nitroGLYCERIN (NITROSTAT) 0.4 MG SL tablet Place 1 tablet (0.4 mg total) under the tongue every 5 (five) minutes x 3 doses  as needed for chest pain. 06/24/13  Yes Rhonda G Barrett, PA-C  omeprazole (PRILOSEC) 20 MG capsule Take 20 mg by mouth 2 (two) times daily. 05/03/15 05/02/16 Yes Historical Provider, MD  pravastatin (PRAVACHOL) 10 MG tablet TAKE 1 TABLET (10 MG TOTAL) BY MOUTH EVERY EVENING. 10/13/15  Yes [name], MD  ALPRAZolam Duanne Moron) 0.25 MG tablet Take 1/2-1 tablet as needed for anxiety Patient not taking: Reported on 10/20/2015 05/27/15   Darlyne Russian, MD    ALLERGIES:  Allergies  Allergen Reactions  . Aspirin Other (See Comments)    Pt is prone to bleeding, ok with low dose coated aspirin  . Nsaids Other  (See Comments)    Pt is prone to bleeding  . Pravastatin Sodium     Other reaction(s): Other (See Comments) elevated LFT'S, tolerates 1/2 dose of zetia and 1/2 dose of pravastatin  . Statins Other (See Comments)    elevated LFT'S, tolerates 1/2 dose of zetia and 1/2 dose of pravastatin    SOCIAL HISTORY:  Social History  Substance Use Topics  . Smoking status: Never Smoker   . Smokeless tobacco: Never Used  . Alcohol Use: 0.0 oz/week    0 Standard drinks or equivalent per week     Comment: Occasional    FAMILY HISTORY: Family History  Problem Relation Age of Onset  . Cancer Mother     Pancreatic Cancer  . Hepatitis C Mother   . Heart disease Father     congestive heart failure  . Panic disorder Daughter   . Asthma Maternal Grandmother   . Emphysema Maternal Grandmother   . Cancer Maternal Grandfather     lung  . Heart attack Father   . Stroke Neg Hx     EXAM: BP 144/66 mmHg  Pulse 90  Temp(Src) 98.9 F (37.2 C) (Oral)  Resp 18  Ht 5\' 1"  (1.549 m)  Wt 128 lb (58.06 kg)  BMI 24.20 kg/m2  SpO2 97%  LMP 11/25/1994 CONSTITUTIONAL: Alert and oriented and responds appropriately to questions. Well-appearing; well-nourished, elderly, smiling, pleasant, nontoxic, afebrile, and no significant distress HEAD: Normocephalic EYES: Conjunctivae clear, PERRL ENT: normal nose; no rhinorrhea; dry mucous membranes; pharynx without lesions noted NECK: Supple, no meningismus, no LAD  CARD: RRR; S1 and S2 appreciated; no murmurs, no clicks, no rubs, no gallops RESP: Normal chest excursion without splinting or tachypnea; breath sounds clear and equal bilaterally; no wheezes, no rhonchi, no rales, no hypoxia or respiratory distress, speaking full sentences ABD/GI: Normal bowel sounds; non-distended; soft, diffusely TTP throughout the lower abdomen with out voluntary guarding, no rebound, no peritoneal signs BACK:  The back appears normal and is non-tender to palpation, there is no CVA  tenderness EXT: Normal ROM in all joints; non-tender to palpation; no edema; normal capillary refill; no cyanosis, no calf tenderness or swelling    SKIN: Normal color for age and race; warm NEURO: Moves all extremities equally, sensation to light touch intact diffusely, cranial nerves II through XII intact PSYCH: The patient's mood and manner are appropriate. Grooming and personal hygiene are appropriate.  MEDICAL DECISION MAKING: Patient here with symptoms similar to her prior episode of colitis. Hemodynamically stable. Afebrile. She is well-appearing, nontoxic. We'll repeat labs, urine and CT of her abdomen and pelvis. During last admission patient did have an elevated lactate at 2.8. Will obtain stool cultures, C. difficile studies.  ED PROGRESS: Patient's labs show leukocytosis of 13,000 with left shift. Otherwise electrolytes, LFTs, lipase normal. Urine shows no  sign of infection. Lactate normal at 1.9. C. difficile is negative. CT scan shows improved but mildly persistent bowel wall thickening in the transverse and descending colon which could represent infectious versus inflammatory colitis. No perforation or abscess. She has a moderate size hiatal hernia which she is aware of. Patient reports she is feeling much better. I haven't offered her in her daughter admission several times the patient states she feels that she can go home. I feel this is reasonable given she had her daughter appear very reliable. Her PCP is Dr. Everlene Farrier with [facility]. I have sent him a note that she was here in the emergency department and will need close follow-up. She has an appointment with her gastroenterologist Dr. Amedeo Plenty on December 30. Have discussed at length strict return precautions. We'll discharge with prescriptions for Cipro and Flagyl which she did well on at home after her discharge on November 17. We'll discharge with Percocet for pain, Zofran for nausea. Patient and daughter verbalized understanding and  are comfortable with this plan.   I personally performed the services described in this documentation, which was scribed in my presence. The recorded information has been reviewed and is accurate.   [place], DO 11/01/15 [phone]

## 2015-11-01 NOTE — Patient Instructions (Addendum)
Please call if you have not heard from Dr. Amedeo Plenty by Friday.

## 2015-11-01 NOTE — ED Notes (Signed)
N/V/D tonight with extreme Abd cramping in low pelvic region.

## 2015-11-02 ENCOUNTER — Telehealth: Payer: Self-pay

## 2015-11-02 LAB — URINE CULTURE: Culture: 1000

## 2015-11-02 NOTE — Telephone Encounter (Signed)
Cipro gives her a headache.   [place]   [phone]

## 2015-11-02 NOTE — Telephone Encounter (Signed)
Ms. Samp wants Dr.Daub to know that she has an appointment scheduled for 11/03/15 @ 9:45am with Dr. Amedeo Plenty

## 2015-11-02 NOTE — Telephone Encounter (Signed)
Kristine [name] are you able to help?

## 2015-11-03 ENCOUNTER — Telehealth: Payer: Self-pay | Admitting: Cardiovascular Disease

## 2015-11-03 ENCOUNTER — Ambulatory Visit (INDEPENDENT_AMBULATORY_CARE_PROVIDER_SITE_OTHER): Payer: BLUE CROSS/BLUE SHIELD

## 2015-11-03 ENCOUNTER — Ambulatory Visit (INDEPENDENT_AMBULATORY_CARE_PROVIDER_SITE_OTHER): Payer: BLUE CROSS/BLUE SHIELD | Admitting: Family Medicine

## 2015-11-03 VITALS — BP 116/68 | HR 67 | Temp 98.7°F | Resp 18 | Ht 61.0 in | Wt 128.0 lb

## 2015-11-03 DIAGNOSIS — M6248 Contracture of muscle, other site: Secondary | ICD-10-CM

## 2015-11-03 DIAGNOSIS — R0789 Other chest pain: Secondary | ICD-10-CM | POA: Diagnosis not present

## 2015-11-03 DIAGNOSIS — M792 Neuralgia and neuritis, unspecified: Secondary | ICD-10-CM | POA: Diagnosis not present

## 2015-11-03 DIAGNOSIS — J040 Acute laryngitis: Secondary | ICD-10-CM

## 2015-11-03 DIAGNOSIS — M62838 Other muscle spasm: Secondary | ICD-10-CM

## 2015-11-03 DIAGNOSIS — K529 Noninfective gastroenteritis and colitis, unspecified: Secondary | ICD-10-CM

## 2015-11-03 MED ORDER — CYCLOBENZAPRINE HCL 5 MG PO TABS: 2.5000 mg | ORAL_TABLET | Freq: Three times a day (TID) | ORAL | Status: DC | PRN

## 2015-11-03 NOTE — Telephone Encounter (Signed)
Let's see what they say and then go from there.

## 2015-11-03 NOTE — Telephone Encounter (Signed)
Left message for pt to call back  °

## 2015-11-03 NOTE — Telephone Encounter (Signed)
Please try and continue to take the Cipro until she can discuss a medication change with Dr. Amedeo Plenty.

## 2015-11-03 NOTE — Telephone Encounter (Signed)
Pt is coming in tomorrow  °

## 2015-11-03 NOTE — Telephone Encounter (Signed)
2 weeks pain intermittent pain right shoulder to right arm. Took nitro 40 minutes ago and took discomfort away some Pain has been getting sharper over the past two weeks No shortness of breath Is congested from bad cold also. Is at office appointment right now with GI

## 2015-11-03 NOTE — Telephone Encounter (Signed)
New problem   Pt having rt arm pain shooting from shoulder over rt breast. Pt took nitro and it stopped. Pt isn't having any chest pain. Pt want advise from nurse.

## 2015-11-03 NOTE — Progress Notes (Signed)
Subjective:  By signing my name below, I, Kristine [name], attest that this documentation has been prepared under the direction and in the presence of Merri Ray, MD.  Leandra Kern, Medical Scribe. 11/03/2015.  5:39 PM.    Patient ID: Kristine [name], female    DOB: Sep 21, 1941, 74 y.o.   MRN: [id number]  Chief Complaint  Patient presents with  . Arm Pain    on/off x2 weeks-rt  . Laryngitis    no better   HPI HPI Comments: Kristine [name] is a 74 y.o. female who presents to [facility] for a follow up.    Cough/ sore throat:  Pt was seen by Dr. Everlene Farrier on 12/02 and then 12/07, thought to be allergic sx. She was also continued on Flexeril and flagyl for persistent colitis. Multiple phone messages today.  Pt has a negative chest X-rays 2 days ago.  Today, pt reports that she still suffers from congestion, post nasal drip, and voice hoarseness. Pt uses her albuterol inhaler every 5-6 hours with which she experienced productive cough. She is most concerned about her laryngitis. She denies fever, wheezing, shortness of breath.   Right arm pain: The pain is shooting form shoulder to the right breast. Pt did take nitro at home, which helped her sx. Next phone note, 2 weeks of pain to the right arm. Per phone note, similar sx as February, was seen in the ER at that time, but was released. She had STEMI in 05/2013 treated with DES. Cardiac enzymes both in Feb and mar both unremarkable, and chest x-rays with no acute findings. No major concerned on her stress myoview on 03/09, though tot be low risk. Pt will follow up Dr. Burt Knack, her cardiologist, on 12/20.  Today, pt states that the pain is intermitted and radiates to her right arm, however she indicates that today, the pain became more severe and sharp with associated "jabbing" chest pain, still intermittent however. She states that taking nitro did relief her symptoms. Pt notes some tightness to the right side of the neck area, and  she is attributing her arm pain to this. Pt denies shortness of breath, dizziness, diaphoresis, nausea, vomiting, or traumatic injury to the arm.   Recurrent Colitis: Pt is compliant with taking Cipro. She was put on flagyl that she stopped taking yesterday due to experiencing headaches. Pt followed up with Dr. Lenard Simmer today who advised her that he was comfortable with her being off the flagyl. She denies increased abdominal pain, or fever.   Patient Active Problem List   Diagnosis Date Noted  . Acute blood loss anemia 10/10/2015  . Colitis 10/08/2015  . CAD (coronary artery disease), native coronary artery 06/27/2013  . Hyperlipidemia 06/27/2013  . Precordial pain 06/26/2013  . ST elevation myocardial infarction (STEMI) of anterolateral wall, initial episode of care ([place]) 06/24/2013  . Hypertension   . Diverticulitis   . GERD (gastroesophageal reflux disease)   . Barrett's esophagus   . Cystocele   . Rectocele    Past Medical History  Diagnosis Date  . GERD (gastroesophageal reflux disease)   . Barrett's esophagus     Stage B  . Cystocele   . Rectocele     mild  . Hypertension   . Diverticulitis   . Arthritis   . Raynaud disease   . CREST variant of scleroderma ([place])   . Osteopenia 09/2012    T score -2.4 FRAX 13%/2.7%  per 10/15/2012 discussion [name] repeat in  2 years  . Anemia   . STEMI (ST elevation myocardial infarction) ([place]) 06/21/2013  . CAD (coronary artery disease)   . Hyperlipidemia    Past Surgical History  Procedure Laterality Date  . Tonsillectomy  1950  . Tubal ligation  1980  . Nissen fundoplication  123456  . Total knee arthroplasty      right  . Joint replacement  9/10    rt total  . Foot surgery  2013    x2  . Abdominal hysterectomy  1996    RSO for menorrhagia.  . Coronary angioplasty with stent placement  06/21/2013   Allergies  Allergen Reactions  . Aspirin Other (See Comments)    Pt is prone to bleeding, ok with low dose coated aspirin  .  Flagyl [Metronidazole]     headaches  . Nsaids Other (See Comments)    Pt is prone to bleeding  . Pravastatin Sodium     Other reaction(s): Other (See Comments) elevated LFT'S, tolerates 1/2 dose of zetia and 1/2 dose of pravastatin  . Statins Other (See Comments)    elevated LFT'S, tolerates 1/2 dose of zetia and 1/2 dose of pravastatin   Prior to Admission medications   Medication Sig Start Date End Date Taking? Authorizing Provider  albuterol (PROVENTIL HFA;VENTOLIN HFA) 108 (90 BASE) MCG/ACT inhaler Inhale 2 puffs into the lungs every 4 (four) hours as needed for wheezing or shortness of breath (cough, shortness of breath or wheezing.). 11/01/15  Yes Darlyne Russian, MD  aspirin EC 81 MG EC tablet Take 1 tablet (81 mg total) by mouth daily. 06/24/13  Yes Rhonda G Barrett, PA-C  ciprofloxacin (CIPRO) 500 MG tablet Take 1 tablet (500 mg total) by mouth 2 (two) times daily. 11/01/15  Yes Kristen N Ward, DO  ezetimibe (ZETIA) 10 MG tablet Take 0.5 tablets (5 mg total) by mouth daily. 09/26/14  Yes [name], MD  ferrous sulfate 325 (65 FE) MG tablet Take 1 tablet (325 mg total) by mouth daily with breakfast. 10/27/15  Yes Darlyne Russian, MD  hyoscyamine (ANASPAZ) 0.125 MG TBDP disintergrating tablet Take 1 tablet by mouth every 4 (four) hours. 10/17/15  Yes Historical Provider, MD  KRILL OIL PO Take 1 capsule by mouth daily.   Yes Historical Provider, MD  loperamide (IMODIUM) 2 MG capsule Take 1 capsule (2 mg total) by mouth 4 (four) times daily as needed for diarrhea or loose stools. 11/01/15  Yes Kristen N Ward, DO  losartan (COZAAR) 50 MG tablet Take 0.5 tablets (25 mg total) by mouth daily. 10/12/15  Yes Florencia Reasons, MD  Multiple Vitamin (MULTIVITAMIN WITH MINERALS) TABS tablet Take 1 tablet by mouth daily.   Yes Historical Provider, MD  nitroGLYCERIN (NITROSTAT) 0.4 MG SL tablet Place 1 tablet (0.4 mg total) under the tongue every 5 (five) minutes x 3 doses as needed for chest pain. 06/24/13  Yes  Rhonda G Barrett, PA-C  omeprazole (PRILOSEC) 20 MG capsule Take 20 mg by mouth 2 (two) times daily. 05/03/15 05/02/16 Yes Historical Provider, MD  pravastatin (PRAVACHOL) 10 MG tablet TAKE 1 TABLET (10 MG TOTAL) BY MOUTH EVERY EVENING. 10/13/15  Yes [name], MD  Cyanocobalamin (B-12 PO) Take 1 tablet by mouth daily.    Historical Provider, MD  fluconazole (DIFLUCAN) 150 MG tablet Take 1 tablet (150 mg total) by mouth once. Patient not taking: Reported on 11/03/2015 11/01/15   Kristen N Ward, DO  metroNIDAZOLE (FLAGYL) 500 MG tablet Take 1 tablet (500 mg  total) by mouth 3 (three) times daily. Patient not taking: Reported on 11/03/2015 11/01/15   Kristen N Ward, DO  ondansetron (ZOFRAN ODT) 4 MG disintegrating tablet Take 1 tablet (4 mg total) by mouth every 8 (eight) hours as needed for nausea or vomiting. Patient not taking: Reported on 11/03/2015 11/01/15   Delice Bison Ward, DO  oxyCODONE-acetaminophen (PERCOCET/ROXICET) 5-325 MG tablet Take 1 tablet by mouth every 6 (six) hours as needed. Patient not taking: Reported on 11/03/2015 11/01/15   Delice Bison Ward, DO   Social History   Social History  . Marital Status: Divorced    Spouse Name: N/A  . Number of Children: N/A  . Years of Education: N/A   Occupational History  . Not on file.   Social History Main Topics  . Smoking status: Never Smoker   . Smokeless tobacco: Never Used  . Alcohol Use: 0.0 oz/week    0 Standard drinks or equivalent per week     Comment: Occasional  . Drug Use: No  . Sexual Activity: No   Other Topics Concern  . Not on file   Social History Narrative   Exercise dancing 3 x times weekly for 1-2 hours    Review of Systems  Constitutional: Negative for fever and diaphoresis.  HENT: Positive for congestion, postnasal drip, sore throat and voice change.   Respiratory: Positive for cough. Negative for shortness of breath and wheezing.   Cardiovascular: Negative for chest pain.  Gastrointestinal: Positive for  abdominal pain. Negative for nausea and vomiting.  Musculoskeletal: Positive for myalgias and neck pain.  Neurological: Negative for dizziness.      Objective:   Physical Exam  Constitutional: She is oriented to person, place, and time. She appears well-developed and well-nourished. No distress.  HENT:  Head: Normocephalic and atraumatic.  Right Ear: Hearing, tympanic membrane, external ear and ear canal normal.  Left Ear: Hearing, tympanic membrane, external ear and ear canal normal.  Nose: Nose normal.  Mouth/Throat: Oropharynx is clear and moist. No oropharyngeal exudate.  No sinus tenderness.  No lymphadenopathy.  Moist oral mucosa.   Eyes: Conjunctivae and EOM are normal. Pupils are equal, round, and reactive to light.  Neck: Neck supple. No JVD present. Carotid bruit is not present.  Neck- FROM. Negative neer. Full RTC strength. Negative [place].   Cardiovascular: Normal rate, regular rhythm, normal heart sounds and intact distal pulses.   No murmur heard. Pulmonary/Chest: Effort normal and breath sounds normal. No respiratory distress. She has no wheezes. She has no rhonchi.  Abdominal: Soft. She exhibits no pulsatile midline mass. There is no tenderness.  Neurological: She is alert and oriented to person, place, and time. No cranial nerve deficit.  Reflex Scores:      Tricep reflexes are 2+ on the right side and 2+ on the left side.      Bicep reflexes are 2+ on the right side and 2+ on the left side.      Brachioradialis reflexes are 2+ on the right side and 2+ on the left side. Skin: Skin is warm and dry. No rash noted.  Psychiatric: She has a normal mood and affect. Her behavior is normal.  Nursing note and vitals reviewed.   Filed Vitals:   11/03/15 1552  BP: 116/68  Pulse: 67  Temp: 98.7 F (37.1 C)  TempSrc: Oral  Resp: 18  Height: 5\' 1"  (1.549 m)  Weight: 128 lb (58.06 kg)  SpO2: 97%    EKG reading: sinus rhythm. RSR'  in V1 and V2. No apparent changes  noted form previous EKG 01/24/2015.  [facility] (PRIMARY) x-ray report read by Dr. Merri Ray, MD: Cervical spine- pronounce curvature with diffuse degenerative changes most notable mid cervical spine without apparent fracture. There is decreased foraminal space at the mid to lower C-spine, right greater than left.      Assessment & [name]:   Kristine [name] is a 74 y.o. female Radicular pain in right arm - [name]: DG Cervical Spine Complete, cyclobenzaprine (FLEXERIL) 5 MG tablet, Muscle spasms of neck - [name]: DG Cervical Spine Complete, cyclobenzaprine (FLEXERIL) 5 MG tablet, Chest wall pain - [name]: EKG 12-Lead  - Exam and symptoms appear to be consistent with cervical radiculitis into the right arm and upper chest wall. Marked degenerative changes on her cervical spine likely cause. No apparent concerning findings on her EKG, and symptoms do not sound typical of cardiac cause. Trial of low-dose Flexeril, side effects discussed, heat, gentle range of motion. Return to clinic if symptoms persist or worsen. ER/RTC precautions discussed if change or worsening of chest symptoms, understanding expressed.  Laryngitis  -Suspected viral URI with secondary laryngitis. Symptomatic care discussed with voice rest, fluids, RTC precautions.  Colitis  -Chronic. Continue follow-up as planned by GI. Now off of Flagyl, only on Cipro. RTC/ER precautions.  Meds ordered this encounter  Medications  . cyclobenzaprine (FLEXERIL) 5 MG tablet    Sig: Take 0.5-1 tablets (2.5-5 mg total) by mouth 3 (three) times daily as needed.    Dispense:  15 tablet    Refill:  0   Patient Instructions  Continue cipro and follow up [name] for your colitis as recommended by Dr. Amedeo Plenty. Return to the clinic or go to the nearest emergency room if any of your symptoms worsen or new symptoms occur in your abdomen.    I suspect you have some laryngitis or viral infection with your throat and congestion symptoms. Albuterol inhaler only if needed  for wheezing or shortness of breath.  Fluids, voice rest and other information below. Return to the clinic or go to the nearest emergency room if any of your symptoms worsen or new symptoms occur.  Your arm symptoms appear to be due to some muscle spasm in your neck, and possible arthritis in the neck that is pinching a nerve. You can try a small dose of muscle relaxant, in addition to some heat or ice over the muscle on the side of your neck to see if this will relax the muscle and lessen the pain in your arm. Start with one half to one of the Flexeril up to every 8 hours. Be very careful this this can cause sedation and lightheadedness or dizziness at times.  If your pains are not improving next week to 10 days with this approach, return here or talk to Dr. Everlene Farrier as the next step may be other imaging or orthopedic evaluation.Return to the clinic or go to the nearest emergency room if any of your symptoms worsen or new symptoms occur.  If you do have new chest pains or worsening symptoms, return to clinic or emergency room.     Cervical Radiculopathy Cervical radiculopathy happens when a nerve in the neck (cervical nerve) is pinched or bruised. This condition can develop because of an injury or as part of the normal aging process. Pressure on the cervical nerves can cause pain or numbness that runs from the neck all the way down into the arm and fingers. Usually, this condition gets  better with rest. Treatment may be needed if the condition does not improve.  CAUSES This condition may be caused by:  Injury.  Slipped (herniated) disk.  Muscle tightness in the neck because of overuse.  Arthritis.  Breakdown or degeneration in the bones and joints of the spine (spondylosis) due to aging.  Bone spurs that may develop near the cervical nerves. SYMPTOMS Symptoms of this condition include:  Pain that runs from the neck to the arm and hand. The pain can be severe or irritating. It may be worse  when the neck is moved.  Numbness or weakness in the affected arm and hand. DIAGNOSIS This condition may be diagnosed based on symptoms, medical history, and a physical exam. You may also have tests, including:  X-rays.  CT scan.  MRI.  Electromyogram (EMG).  Nerve conduction tests. TREATMENT In many cases, treatment is not needed for this condition. With rest, the condition usually gets better over time. If treatment is needed, options may include:  Wearing a soft neck collar for short periods of time.  Physical therapy to strengthen your neck muscles.  Medicines, such as NSAIDs, oral corticosteroids, or spinal injections.  Surgery. This may be needed if other treatments do not help. Various types of surgery may be done depending on the cause of your problems. HOME CARE INSTRUCTIONS Managing Pain  Take over-the-counter and prescription medicines only as told by your health care provider.  If directed, apply ice to the affected area.  Put ice in a plastic bag.  Place a towel between your skin and the bag.  Leave the ice on for 20 minutes, 2-3 times per day.  If ice does not help, you can try using heat. Take a warm shower or warm bath, or use a heat pack as told by your health care provider.  Try a gentle neck and shoulder massage to help relieve symptoms. Activity  Rest as needed. Follow instructions from your health care provider about any restrictions on activities.  Do stretching and strengthening exercises as told by your health care provider or physical therapist. General Instructions  If you were given a soft collar, wear it as told by your health care provider.  Use a flat pillow when you sleep.  Keep all follow-up visits as told by your health care provider. This is important. SEEK MEDICAL CARE IF:  Your condition does not improve with treatment. SEEK IMMEDIATE MEDICAL CARE IF:  Your pain gets much worse and cannot be controlled with  medicines.  You have weakness or numbness in your hand, arm, face, or leg.  You have a high fever.  You have a stiff, rigid neck.  You lose control of your bowels or your bladder (have incontinence).  You have trouble with walking, balance, or speaking.   This information is not intended to replace advice given to you by your health care provider. Make sure you discuss any questions you have with your health care provider.   Document Released: 08/06/2001 Document Revised: 08/02/2015 Document Reviewed: 01/05/2015 Elsevier Interactive Patient Education 2016 [employer]  Laryngitis Laryngitis is inflammation of your vocal cords. This causes hoarseness, coughing, loss of voice, sore throat, or a dry throat. Your vocal cords are two bands of muscles that are found in your throat. When you speak, these cords come together and vibrate. These vibrations come out through your mouth as sound. When your vocal cords are inflamed, your voice sounds different. Laryngitis can be temporary (acute) or long-term (chronic). Most cases  of acute laryngitis improve with time. Chronic laryngitis is laryngitis that lasts for more than three weeks. CAUSES Acute laryngitis may be caused by:  A viral infection.  Lots of talking, yelling, or singing. This is also called vocal strain.  Bacterial infections. Chronic laryngitis may be caused by:  Vocal strain.  Injury to your vocal cords.  Acid reflux (gastroesophageal reflux disease or GERD).  Allergies.  Sinus infection.  Smoking.  Alcohol abuse.  Breathing in chemicals or dust.  Growths on the vocal cords. RISK FACTORS Risk factors for laryngitis include:  Smoking.  Alcohol abuse.  Having allergies. SIGNS AND SYMPTOMS Symptoms of laryngitis may include:  Low, hoarse voice.  Loss of voice.  Dry cough.  Sore throat.  Stuffy nose. DIAGNOSIS Laryngitis may be diagnosed by:  Physical exam.  Throat culture.  Blood  test.  Laryngoscopy. This procedure allows your health care provider to look at your vocal cords with a mirror or viewing tube. TREATMENT Treatment for laryngitis depends on what is causing it. Usually, treatment involves resting your voice and using medicines to soothe your throat. However, if your laryngitis is caused by a bacterial infection, you may need to take antibiotic medicine. If your laryngitis is caused by a growth, you may need to have a procedure to remove it. HOME CARE INSTRUCTIONS  Drink enough fluid to keep your urine clear or pale yellow.  Breathe in moist air. Use a humidifier if you live in a dry climate.  Take medicines only as directed by your health care provider.  If you were prescribed an antibiotic medicine, finish it all even if you start to feel better.  Do not smoke cigarettes or electronic cigarettes. If you need help quitting, ask your health care provider.  Talk as little as possible. Also avoid whispering, which can cause vocal strain.  Write instead of talking. Do this until your voice is back to normal. SEEK MEDICAL CARE IF:  You have a fever.  You have increasing pain.  You have difficulty swallowing. SEEK IMMEDIATE MEDICAL CARE IF:  You cough up blood.  You have trouble breathing.   This information is not intended to replace advice given to you by your health care provider. Make sure you discuss any questions you have with your health care provider.   Document Released: 11/11/2005 Document Revised: 12/02/2014 Document Reviewed: 04/26/2014 Elsevier Interactive Patient Education [employer].     I personally performed the services described in this documentation, which was scribed in my presence. The recorded information has been reviewed and considered, and addended by me as needed.

## 2015-11-03 NOTE — Patient Instructions (Addendum)
Continue cipro and follow up plan for your colitis as recommended by Dr. Amedeo Plenty. Return to the clinic or go to the nearest emergency room if any of your symptoms worsen or new symptoms occur in your abdomen.    I suspect you have some laryngitis or viral infection with your throat and congestion symptoms. Albuterol inhaler only if needed for wheezing or shortness of breath.  Fluids, voice rest and other information below. Return to the clinic or go to the nearest emergency room if any of your symptoms worsen or new symptoms occur.  Your arm symptoms appear to be due to some muscle spasm in your neck, and possible arthritis in the neck that is pinching a nerve. You can try a small dose of muscle relaxant, in addition to some heat or ice over the muscle on the side of your neck to see if this will relax the muscle and lessen the pain in your arm. Start with one half to one of the Flexeril up to every 8 hours. Be very careful this this can cause sedation and lightheadedness or dizziness at times.  If your pains are not improving next week to 10 days with this approach, return here or talk to Dr. Everlene Farrier as the next step may be other imaging or orthopedic evaluation.Return to the clinic or go to the nearest emergency room if any of your symptoms worsen or new symptoms occur.  If you do have new chest pains or worsening symptoms, return to clinic or emergency room.     Cervical Radiculopathy Cervical radiculopathy happens when a nerve in the neck (cervical nerve) is pinched or bruised. This condition can develop because of an injury or as part of the normal aging process. Pressure on the cervical nerves can cause pain or numbness that runs from the neck all the way down into the arm and fingers. Usually, this condition gets better with rest. Treatment may be needed if the condition does not improve.  CAUSES This condition may be caused by:  Injury.  Slipped (herniated) disk.  Muscle tightness in the neck  because of overuse.  Arthritis.  Breakdown or degeneration in the bones and joints of the spine (spondylosis) due to aging.  Bone spurs that may develop near the cervical nerves. SYMPTOMS Symptoms of this condition include:  Pain that runs from the neck to the arm and hand. The pain can be severe or irritating. It may be worse when the neck is moved.  Numbness or weakness in the affected arm and hand. DIAGNOSIS This condition may be diagnosed based on symptoms, medical history, and a physical exam. You may also have tests, including:  X-rays.  CT scan.  MRI.  Electromyogram (EMG).  Nerve conduction tests. TREATMENT In many cases, treatment is not needed for this condition. With rest, the condition usually gets better over time. If treatment is needed, options may include:  Wearing a soft neck collar for short periods of time.  Physical therapy to strengthen your neck muscles.  Medicines, such as NSAIDs, oral corticosteroids, or spinal injections.  Surgery. This may be needed if other treatments do not help. Various types of surgery may be done depending on the cause of your problems. HOME CARE INSTRUCTIONS Managing Pain  Take over-the-counter and prescription medicines only as told by your health care provider.  If directed, apply ice to the affected area.  Put ice in a plastic bag.  Place a towel between your skin and the bag.  Leave the ice  on for 20 minutes, 2-3 times per day.  If ice does not help, you can try using heat. Take a warm shower or warm bath, or use a heat pack as told by your health care provider.  Try a gentle neck and shoulder massage to help relieve symptoms. Activity  Rest as needed. Follow instructions from your health care provider about any restrictions on activities.  Do stretching and strengthening exercises as told by your health care provider or physical therapist. General Instructions  If you were given a soft collar, wear it as  told by your health care provider.  Use a flat pillow when you sleep.  Keep all follow-up visits as told by your health care provider. This is important. SEEK MEDICAL CARE IF:  Your condition does not improve with treatment. SEEK IMMEDIATE MEDICAL CARE IF:  Your pain gets much worse and cannot be controlled with medicines.  You have weakness or numbness in your hand, arm, face, or leg.  You have a high fever.  You have a stiff, rigid neck.  You lose control of your bowels or your bladder (have incontinence).  You have trouble with walking, balance, or speaking.   This information is not intended to replace advice given to you by your health care provider. Make sure you discuss any questions you have with your health care provider.   Document Released: 08/06/2001 Document Revised: 08/02/2015 Document Reviewed: 01/05/2015 Elsevier Interactive Patient Education 2016 Elsevier [employer]  Laryngitis Laryngitis is inflammation of your vocal cords. This causes hoarseness, coughing, loss of voice, sore throat, or a dry throat. Your vocal cords are two bands of muscles that are found in your throat. When you speak, these cords come together and vibrate. These vibrations come out through your mouth as sound. When your vocal cords are inflamed, your voice sounds different. Laryngitis can be temporary (acute) or long-term (chronic). Most cases of acute laryngitis improve with time. Chronic laryngitis is laryngitis that lasts for more than three weeks. CAUSES Acute laryngitis may be caused by:  A viral infection.  Lots of talking, yelling, or singing. This is also called vocal strain.  Bacterial infections. Chronic laryngitis may be caused by:  Vocal strain.  Injury to your vocal cords.  Acid reflux (gastroesophageal reflux disease or GERD).  Allergies.  Sinus infection.  Smoking.  Alcohol abuse.  Breathing in chemicals or dust.  Growths on the vocal cords. RISK FACTORS Risk  factors for laryngitis include:  Smoking.  Alcohol abuse.  Having allergies. SIGNS AND SYMPTOMS Symptoms of laryngitis may include:  Low, hoarse voice.  Loss of voice.  Dry cough.  Sore throat.  Stuffy nose. DIAGNOSIS Laryngitis may be diagnosed by:  Physical exam.  Throat culture.  Blood test.  Laryngoscopy. This procedure allows your health care provider to look at your vocal cords with a mirror or viewing tube. TREATMENT Treatment for laryngitis depends on what is causing it. Usually, treatment involves resting your voice and using medicines to soothe your throat. However, if your laryngitis is caused by a bacterial infection, you may need to take antibiotic medicine. If your laryngitis is caused by a growth, you may need to have a procedure to remove it. HOME CARE INSTRUCTIONS  Drink enough fluid to keep your urine clear or pale yellow.  Breathe in moist air. Use a humidifier if you live in a dry climate.  Take medicines only as directed by your health care provider.  If you were prescribed an antibiotic medicine, finish it  all even if you start to feel better.  Do not smoke cigarettes or electronic cigarettes. If you need help quitting, ask your health care provider.  Talk as little as possible. Also avoid whispering, which can cause vocal strain.  Write instead of talking. Do this until your voice is back to normal. SEEK MEDICAL CARE IF:  You have a fever.  You have increasing pain.  You have difficulty swallowing. SEEK IMMEDIATE MEDICAL CARE IF:  You cough up blood.  You have trouble breathing.   This information is not intended to replace advice given to you by your health care provider. Make sure you discuss any questions you have with your health care provider.   Document Released: 11/11/2005 Document Revised: 12/02/2014 Document Reviewed: 04/26/2014 Elsevier Interactive Patient Education [employer].

## 2015-11-03 NOTE — Telephone Encounter (Signed)
Patient said that it took about 15 minutes while she drove after taking the nitro that it went away Pain is down right arm and not across her chest It feels exactly like the discomfort she had when she was seen back in February this year (2016) Will be seeing Dr. Everlene Farrier today Truitt Merle NP-C recommended that she follow up with her PCP. (Dr. Everlene Farrier)

## 2015-11-03 NOTE — Telephone Encounter (Signed)
Please make sure patient is seen today. I am not in the office today and she needs to be seen today and I can follow up with her tomorrow.

## 2015-11-03 NOTE — Telephone Encounter (Signed)
Spoke with pt, advised her to come into the office. She wants to only see Dr. Everlene Farrier and will come into the office at 8 am tomorrow. I advised her to come if she is worse or go to ED. Pt agreed.

## 2015-11-05 LAB — STOOL CULTURE

## 2015-11-10 ENCOUNTER — Other Ambulatory Visit: Payer: Self-pay | Admitting: Gastroenterology

## 2015-11-10 LAB — HM COLONOSCOPY

## 2015-11-13 ENCOUNTER — Telehealth: Payer: Self-pay | Admitting: Family Medicine

## 2015-11-13 NOTE — Telephone Encounter (Signed)
Patient saw Dr. Amedeo Plenty Friday 11/03/2015, then this past Friday, 11/10/2015, he performed colonoscopy and took a bx. He told her it would take 7-14 days before would get results.

## 2015-11-13 NOTE — Progress Notes (Addendum)
Cardiology Office Note    Date:  11/14/2015   ID:  Kristine [name], DOB [birth year], MRN [id number]  PCP:  Jenny Reichmann, MD  Cardiologist:  Dr. [name]   Electrophysiologist:  n/a  Chief Complaint  Patient presents with  . Coronary Artery Disease    History of Present Illness:  Kristine [name] is a 74 y.o. female with a hx of CAD status post lateral STEMI 05/2013 treated with a DES to the OM1, HTN, HL. EF was 35-40% at the time of her cardiac catheterization. Follow-up echocardiogram demonstrated normal LV function with an EF of 50-55%. She had residual disease on her LHC including an occluded small diagonal that filled by L-L collaterals. She was seen in the ED in 2/16 and 3/16 with complaints of chest pain.  I saw her in FU and set up a stress nuclear study.  The study was called intermediate risk b/c of a drop in BP during exercise that was thought to possibly be technical error (initial BP in recovery was higher). There was diaphragmatic attenuation but no ischemia.  I reviewed this with Dr. [name] and we felt her study was low risk and recommended continued medical therapy.    She was admitted in 09/2015 with probable ischemic colitis.  She just had a colonoscopy last week with Dr. Amedeo Plenty.  She was recently seen by her PCP with R arm pain.  Reviewed notes. She is suspected to have radiculopathy.  C-spine xray demonstrates degenerative disease, spondylosis, foraminal stenosis especially at C3-4 and worse on the R.    She denies any chest pain, significant dyspnea, orthopnea, PND, edema. She denies syncope.  She has not been very active since she was admitted with colitis.  She had to drastically change her diet.  She remains somewhat weak.     Past Medical History  Diagnosis Date  . GERD (gastroesophageal reflux disease)   . Barrett's esophagus     Stage B  . Cystocele   . Rectocele     mild  . Hypertension   . Diverticulitis   . Arthritis   . Raynaud disease   . 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 Osteopenia 09/2012    T score -2.4 FRAX 13%/2.7%  per 10/15/2012 discussion plan repeat in 2 years  . Anemia   . CAD (coronary artery disease)     a. s/p STEMI 7/14 >> LHC: LM 20, mLAD 30-40, small Dx 100 (L-L collats), OM1 90, OM2 30-40, mRCA 50-60, Lat/AL akinesis, EF 35-40% >> PCI: Promus DES to OM1;  b. Myoview 3/16: diaph atten, no ischemia, EF 59% (intermediate risk b/c BP drop 152>>137, but recovery BP 162 (tech error) >> med Rx continued   . Hyperlipidemia   . Ischemic cardiomyopathy     a. EF 35-40% by LHC at time of MI;  b. Echo 7/14:  Mild focal basal septal hypertrophy, EF 50-55%, apical HK, grade 1 diastolic dysfunction, trivial AI   . History of Doppler ultrasound     a. Carotid US 2/12: bilateral ICA < 50%    Past Surgical History  Procedure Laterality Date  . Tonsillectomy  1950  . Tubal ligation  1980  . Nissen fundoplication  123456  . Total knee arthroplasty      right  . Joint replacement  9/10    rt total  . Foot surgery  2013    x2  . Abdominal hysterectomy  1996    RSO for menorrhagia.  Marland Kitchen  Coronary angioplasty with stent placement  06/21/2013    Current Outpatient Prescriptions  Medication Sig Dispense Refill  . albuterol (PROVENTIL HFA;VENTOLIN HFA) 108 (90 BASE) MCG/ACT inhaler Inhale 2 puffs into the lungs every 4 (four) hours as needed for wheezing or shortness of breath (cough, shortness of breath or wheezing.). 1 Inhaler 1  . aspirin EC 81 MG EC tablet Take 1 tablet (81 mg total) by mouth daily.    Marland Kitchen ezetimibe (ZETIA) 10 MG tablet Take 0.5 tablets (5 mg total) by mouth daily. 90 tablet 2  . KRILL OIL PO Take 1 capsule by mouth daily.    Marland Kitchen loperamide (IMODIUM) 2 MG capsule Take 1 capsule (2 mg total) by mouth 4 (four) times daily as needed for diarrhea or loose stools. 15 capsule 0  . losartan (COZAAR) 50 MG tablet Take 0.5 tablets (25 mg total) by mouth daily. 30 tablet 11  . Multiple Vitamin (MULTIVITAMIN WITH MINERALS)  TABS tablet Take 1 tablet by mouth daily.    . nitroGLYCERIN (NITROSTAT) 0.4 MG SL tablet Place 1 tablet (0.4 mg total) under the tongue every 5 (five) minutes x 3 doses as needed for chest pain. 25 tablet 12  . omeprazole (PRILOSEC) 20 MG capsule Take 20 mg by mouth 2 (two) times daily.    . pravastatin (PRAVACHOL) 10 MG tablet TAKE 1 TABLET (10 MG TOTAL) BY MOUTH EVERY EVENING. 90 tablet 2   No current facility-administered medications for this visit.    Allergies:   Aspirin; Flagyl; Nsaids; Pravastatin sodium; and Statins   Social History   Social History  . Marital Status: Divorced    Spouse Name: N/A  . Number of Children: N/A  . Years of Education: N/A   Social History Main Topics  . Smoking status: Never Smoker   . Smokeless tobacco: Never Used  . Alcohol Use: 0.0 oz/week    0 Standard drinks or equivalent per week     Comment: Occasional  . Drug Use: No  . Sexual Activity: No   Other Topics Concern  . None   Social History Narrative   Exercise dancing 3 x times weekly for 1-2 hours     Family History:  The patient's family history includes Asthma in her maternal grandmother; Cancer in her maternal grandfather and mother; Emphysema in her maternal grandmother; Heart attack in her father; Heart disease in her father; Hepatitis C in her mother; Panic disorder in her daughter. There is no history of Stroke.   ROS:   Please see the history of present illness.    Review of Systems  Respiratory: Positive for cough.   Musculoskeletal:       R arm pain  Gastrointestinal: Positive for abdominal pain, constipation, diarrhea, hematochezia, nausea and vomiting.  All other systems reviewed and are negative.   PHYSICAL EXAM:   VS:  BP 120/70 mmHg  Pulse 98  Ht 5\' 1"  (1.549 m)  Wt 128 lb 3.2 oz (58.151 kg)  BMI 24.24 kg/m2  LMP 11/25/1994   GEN: Well nourished, well developed, in no acute distress HEENT: normal Neck: no JVD, no masses Cardiac: Normal S1/S2, RRR; no  murmurs, rubs, or gallops, no edema; no carotid bruits; DP/PT 2+ bilaterally  Respiratory:  clear to auscultation bilaterally; no wheezing, rhonchi or rales GI: soft, nontender, nondistended, + BS MS: no deformity or atrophy Skin: warm and dry, no rash Neuro:   no focal deficits  Psych: Alert and oriented x 3, normal affect  Wt Readings from  Last 3 Encounters:  11/14/15 128 lb 3.2 oz (58.151 kg)  11/03/15 128 lb (58.06 kg)  11/01/15 130 lb (58.968 kg)      Studies/Labs Reviewed:   EKG:  EKG is not ordered today.   The ekg from 11/06/15 reviewed and demonstrates NSR, HR 71, incomplete RBBB, QTc 430 ms-similar to prior tracings  Recent Labs: 10/09/2015: TSH 1.580 10/12/2015: Magnesium 1.8 11/01/2015: ALT 35; BUN 18; Creatinine, Ser 0.92; Hemoglobin 12.0; Platelets 206; Potassium 4.2; Sodium 139   Recent Lipid Panel    Component Value Date/Time   CHOL 153 07/29/2014 0926   CHOL 244* 08/22/2013 1617   TRIG 112 07/29/2014 0926   TRIG 171* 08/22/2013 1617   HDL 49 07/29/2014 0926   HDL 45 08/22/2013 1617   CHOLHDL 5.4 08/22/2013 1617   VLDL 34 08/22/2013 1617   LDLCALC 82 07/29/2014 0926   LDLCALC 165* 08/22/2013 1617    Additional studies/ records that were reviewed today include:   C-spine X-ray 11/03/15 IMPRESSION: Degenerative disc disease and spondylosis at C3-4, C4-5 and C5-6, worst at C3-4. Bilateral foraminal stenoses at these levels, right greater than left. Since the patient's radicular pain in the right upper extremity is chronic with recent acute worsening, MRI of the cervical spine without contrast may be helpful in further evaluation.  Myoview 02/01/15 Intermediate risk, inferolateral and anterolateral defect most likely secondary to diaphragmatic attenuation, no ischemia. BP did drop 152/49 >> 137/54mmHg but BP in recovery was 162/55 >> may be tech error; EF 59%.   Echocardiogram 05/2013 Mild focal basal septal hypertrophy, EF 50-55%, apical HK, grade 1  diastolic dysfunction, trivial AI  Cardiac Cath/PCI 05/2013 LM: 20% LAD: Mid-vessel with 30-40% stenosis. Small diagonal occluded fills from left-left collaterals with late filling. LCx: 1st OM with 90% stenosis, 2nd Om with mild diffuse 30-40% stenosis  RCA: Mid-vessel with 50-60% stenosis.  Large lateral wall/anterolateral AK, EF 35-40%. PCI Data: Proximal OM Stent 2.75x16 mm Promus  Carotid US 12/2010 Bilat ICA < 50%    ASSESSMENT:    1. Coronary artery disease involving native coronary artery of native heart without angina pectoris   2. Essential hypertension   3. Hyperlipidemia   4. Radicular pain in right arm     PLAN:  In order of problems listed above:  1.CAD: s/p Lat STEMI in 2014 tx with DES to OM1.  Myoview in 3/16 was neg for ischemia.  No angina.   Continue aspirin, ACE inhibitor, statin.  2. HTN: Controlled.  3. Hyperlipidemia: Continue statin, Zetia.  Arrange fasting lipids.  Recent LFTs ok.   4. R Arm Pain - Seen recently by PCP for probable cervical radiculitis.  XR shows degenerative changes and spondylosis with bilateral foraminal stenosis worse at C3-4 and worse on the R.  R arm symptoms seem to be related to cervical DJD. She should FU with her PCP for further management.      Medication Adjustments/Labs and Tests Ordered: Current medicines are reviewed at length with the patient today.  Concerns regarding medicines are outlined above.  Medication changes, Labs and Tests ordered today are listed below. Patient Instructions  Medication Instructions:  Your physician recommends that you continue on your current medications as directed. Please refer to the Current Medication list given to you today.   Labwork: LIPID PANEL TO BE DONE FASTING   Testing/Procedures: NONE  Follow-Up: 6 MONTHS WITH DR. Burt Knack; WE WILL SEND OUT A REMINDER LETTER A COUPLE OF MONTHS EARLIER TO CALL AND MAKE APPT  Any Other Special Instructions Will Be Listed  Below (If Applicable).   If you need a refill on your cardiac medications before your next appointment, please call your pharmacy.    Signed, Richardson Dopp, PA-C  11/14/2015 12:41 PM    [facility] [place], [place], [place]  [address] Phone: [phone]; Fax: [phone]

## 2015-11-14 ENCOUNTER — Encounter: Payer: Self-pay | Admitting: Physician Assistant

## 2015-11-14 ENCOUNTER — Ambulatory Visit (INDEPENDENT_AMBULATORY_CARE_PROVIDER_SITE_OTHER): Payer: BLUE CROSS/BLUE SHIELD | Admitting: Physician Assistant

## 2015-11-14 VITALS — BP 120/70 | HR 98 | Ht 61.0 in | Wt 128.2 lb

## 2015-11-14 DIAGNOSIS — E785 Hyperlipidemia, unspecified: Secondary | ICD-10-CM

## 2015-11-14 DIAGNOSIS — I2510 Atherosclerotic heart disease of native coronary artery without angina pectoris: Secondary | ICD-10-CM

## 2015-11-14 DIAGNOSIS — I10 Essential (primary) hypertension: Secondary | ICD-10-CM

## 2015-11-14 DIAGNOSIS — M792 Neuralgia and neuritis, unspecified: Secondary | ICD-10-CM

## 2015-11-14 MED ORDER — NITROGLYCERIN 0.4 MG SL SUBL: 0.4000 mg | SUBLINGUAL_TABLET | SUBLINGUAL | Status: DC | PRN

## 2015-11-14 NOTE — Patient Instructions (Signed)
Medication Instructions:  Your physician recommends that you continue on your current medications as directed. Please refer to the Current Medication list given to you today.   Labwork: LIPID PANEL TO BE DONE FASTING   Testing/Procedures: NONE  Follow-Up: 6 MONTHS WITH DR. Burt Knack; WE WILL SEND OUT A REMINDER LETTER A COUPLE OF MONTHS EARLIER TO CALL AND MAKE APPT  Any Other Special Instructions Will Be Listed Below (If Applicable).   If you need a refill on your cardiac medications before your next appointment, please call your pharmacy.

## 2015-11-16 ENCOUNTER — Other Ambulatory Visit: Payer: Self-pay

## 2015-11-16 MED ORDER — EZETIMIBE 10 MG PO TABS: 5.0000 mg | ORAL_TABLET | Freq: Every day | ORAL | Status: DC

## 2015-11-21 ENCOUNTER — Telehealth: Payer: Self-pay

## 2015-11-21 DIAGNOSIS — R638 Other symptoms and signs concerning food and fluid intake: Secondary | ICD-10-CM

## 2015-11-21 NOTE — Telephone Encounter (Signed)
Pt would like to have a referral to [place] [facility] for her eating habits. They will schedule her when we do the referral Please call pt at [phone]    Please fax the referral to [phone] attn: [facility]

## 2015-11-21 NOTE — Telephone Encounter (Signed)
:   Ahead and make the referral for [facility].

## 2015-11-21 NOTE — Telephone Encounter (Signed)
Can we refer? 

## 2015-11-22 ENCOUNTER — Ambulatory Visit (INDEPENDENT_AMBULATORY_CARE_PROVIDER_SITE_OTHER): Payer: BLUE CROSS/BLUE SHIELD | Admitting: Emergency Medicine

## 2015-11-22 VITALS — BP 110/58 | HR 96 | Temp 98.4°F | Resp 16 | Ht 61.0 in | Wt 123.8 lb

## 2015-11-22 DIAGNOSIS — R1084 Generalized abdominal pain: Secondary | ICD-10-CM

## 2015-11-22 DIAGNOSIS — K529 Noninfective gastroenteritis and colitis, unspecified: Secondary | ICD-10-CM

## 2015-11-22 NOTE — Addendum Note (Signed)
Addended by: Eulis Foster on: 11/22/2015 04:03 PM   Modules accepted: Orders

## 2015-11-22 NOTE — Progress Notes (Signed)
Patient ID: Kristine [name], female   DOB: December 23, 1940, 74 y.o.   MRN: [id number]      By signing my name below, I, Kristine [name], attest that this documentation has been prepared under the direction and in the presence of [name], MD.  Electronically Signed: Zola [name], Medical Scribe. 11/22/2015. 3:39 PM.   Chief Complaint:  Chief Complaint  Patient presents with  . Other    Return to work authorization  . Abdominal Cramping    Colotits    HPI: Kristine [name] is a 74 y.o. female with a history of colitis and Barrett's esophagus who reports to [facility] today complaining of cramping abdominal pain that started 2 days ago. Patient reports having associated abdominal bloating, fatigue and nausea. She was started on budesonide by Dr. Amedeo Plenty yesterday. Patient denies diarrhea. She had a colostomy 2 weeks ago and notes that her colon was normal other than scarring of the colon. She has been feeling malnourished following her dietary restrictions; she is restricted from wheat, dairy, raw vegetables, raw fruit, and sugar. She typically eats an egg, an [place] muffin, decaf coffee, and meat each day. She had been unable to make an appointment at the [facility] because she did not have a referral; this was placed yesterday.  Patient feels she needs to retire.  Past Medical History  Diagnosis Date  . GERD (gastroesophageal reflux disease)   . Barrett's esophagus     Stage B  . Cystocele   . Rectocele     mild  . Hypertension   . Diverticulitis   . Arthritis   . Raynaud disease   . CREST variant of scleroderma ([place])   . Osteopenia 09/2012    T score -2.4 FRAX 13%/2.7%  per 10/15/2012 discussion plan repeat in 2 years  . Anemia   . CAD (coronary artery disease)     a. s/p STEMI 7/14 >> LHC: LM 20, mLAD 30-40, small Dx 100 (L-L collats), OM1 90, OM2 30-40, mRCA 50-60, Lat/AL akinesis, EF 35-40% >> PCI: Promus DES to OM1;  b. Myoview 3/16: diaph atten, no ischemia, EF 59%  (intermediate risk b/c BP drop 152>>137, but recovery BP 162 (tech error) >> med Rx continued   . Hyperlipidemia   . Ischemic cardiomyopathy     a. EF 35-40% by LHC at time of MI;  b. Echo 7/14:  Mild focal basal septal hypertrophy, EF 50-55%, apical HK, grade 1 diastolic dysfunction, trivial AI   . History of Doppler ultrasound     a. Carotid US 2/12: bilateral ICA < 50%   Past Surgical History  Procedure Laterality Date  . Tonsillectomy  1950  . Tubal ligation  1980  . Nissen fundoplication  123456  . Total knee arthroplasty      right  . Joint replacement  9/10    rt total  . Foot surgery  2013    x2  . Abdominal hysterectomy  1996    RSO for menorrhagia.  . Coronary angioplasty with stent placement  06/21/2013   Social History   Social History  . Marital Status: Divorced    Spouse Name: N/A  . Number of Children: N/A  . Years of Education: N/A   Social History Main Topics  . Smoking status: Never Smoker   . Smokeless tobacco: Never Used  . Alcohol Use: 0.0 oz/week    0 Standard drinks or equivalent per week     Comment: Occasional  . Drug Use: No  .  Sexual Activity: No   Other Topics Concern  . None   Social History Narrative   Exercise dancing 3 x times weekly for 1-2 hours   Family History  Problem Relation Age of Onset  . Cancer Mother     Pancreatic Cancer  . Hepatitis C Mother   . Heart disease Father     congestive heart failure  . Panic disorder Daughter   . Asthma Maternal Grandmother   . Emphysema Maternal Grandmother   . Cancer Maternal Grandfather     lung  . Heart attack Father   . Stroke Neg Hx    Allergies  Allergen Reactions  . Aspirin Other (See Comments)    Pt is prone to bleeding, ok with low dose coated aspirin  . Flagyl [Metronidazole]     headaches  . Nsaids Other (See Comments)    Pt is prone to bleeding  . Pravastatin Sodium     Other reaction(s): Other (See Comments) elevated LFT'S, tolerates 1/2 dose of zetia and 1/2 dose  of pravastatin  . Statins Other (See Comments)    elevated LFT'S, tolerates 1/2 dose of zetia and 1/2 dose of pravastatin   Prior to Admission medications   Medication Sig Start Date End Date Taking? Authorizing Provider  aspirin EC 81 MG EC tablet Take 1 tablet (81 mg total) by mouth daily. 06/24/13  Yes Rhonda G Barrett, PA-C  budesonide (ENTOCORT EC) 3 MG 24 hr capsule Take 3 mg by mouth 3 (three) times daily.   Yes Historical Provider, MD  ezetimibe (ZETIA) 10 MG tablet Take 0.5 tablets (5 mg total) by mouth daily. 11/16/15  Yes [name], MD  losartan (COZAAR) 50 MG tablet Take 0.5 tablets (25 mg total) by mouth daily. 10/12/15  Yes Florencia Reasons, MD  Multiple Vitamin (MULTIVITAMIN WITH MINERALS) TABS tablet Take 1 tablet by mouth daily.   Yes Historical Provider, MD  nitroGLYCERIN (NITROSTAT) 0.4 MG SL tablet Place 1 tablet (0.4 mg total) under the tongue every 5 (five) minutes x 3 doses as needed for chest pain. 11/14/15  Yes Scott Joylene Draft, PA-C  omeprazole (PRILOSEC) 20 MG capsule Take 20 mg by mouth 2 (two) times daily. 05/03/15 05/02/16 Yes Historical Provider, MD  pravastatin (PRAVACHOL) 10 MG tablet TAKE 1 TABLET (10 MG TOTAL) BY MOUTH EVERY EVENING. 10/13/15  Yes [name], MD  albuterol (PROVENTIL HFA;VENTOLIN HFA) 108 (90 BASE) MCG/ACT inhaler Inhale 2 puffs into the lungs every 4 (four) hours as needed for wheezing or shortness of breath (cough, shortness of breath or wheezing.). Patient not taking: Reported on 11/22/2015 11/01/15   Darlyne Russian, MD  KRILL OIL PO Take 1 capsule by mouth daily. Reported on 11/22/2015    Historical Provider, MD  loperamide (IMODIUM) 2 MG capsule Take 1 capsule (2 mg total) by mouth 4 (four) times daily as needed for diarrhea or loose stools. Patient not taking: Reported on 11/22/2015 11/01/15   Kristen N Ward, DO     ROS: The patient denies fevers, chills, night sweats, chest pain, palpitations, wheezing, dyspnea on exertion, vomiting, dysuria,  hematuria, melena, numbness, weakness, or tingling.   Patient also states that her urine is warm and cloudy.  All other systems have been reviewed and were otherwise negative with the exception of those mentioned in the HPI and as above.    PHYSICAL EXAM: Filed Vitals:   11/22/15 1354  BP: 110/58  Pulse: 96  Temp: 98.4 F (36.9 C)  Resp: 16  Body mass index is 23.4 kg/(m^2).   General: Alert, no acute distress HEENT:  Normocephalic, atraumatic, oropharynx patent. Eye: Juliette Mangle [facility] Cardiovascular:  Regular rate and rhythm, no rubs murmurs or gallops.  No Carotid bruits, radial pulse intact. No pedal edema.  Respiratory: Clear to auscultation bilaterally.  No wheezes, rales, or rhonchi.  No cyanosis, no use of accessory musculature Abdominal: Distended. There is diffuse tenderness, no rebound. Musculoskeletal: Gait intact. No edema, tenderness Skin: No rashes. Neurologic: Facial musculature symmetric. Psychiatric: Patient acts appropriately throughout our interaction. Lymphatic: No cervical or submandibular lymphadenopathy    LABS:    EKG/XRAY:   Primary read interpreted by Dr. [facility] at [facility].   ASSESSMENT/PLAN: Patient suffering from severe colitis. Recently started on a new medication because of severe crampy abdominal pain. She states she will not be able to afford this in the lon She is currently out of work until January 3rd. Referral made to nutritionist to see if we can figure out some foods she can eat.I personally performed the services described in this documentation, which was scribed in my presence. The recorded information has been reviewed and is accurate.Johney [place] sideeffects, risk and benefits, and alternatives of medications d/w patient. Patient is aware that all medications have potential sideeffects and we are unable to predict every sideeffect or drug-drug interaction that may occur.  [name] MD 11/22/2015 3:39 PM

## 2015-11-23 ENCOUNTER — Other Ambulatory Visit (INDEPENDENT_AMBULATORY_CARE_PROVIDER_SITE_OTHER): Payer: BLUE CROSS/BLUE SHIELD | Admitting: *Deleted

## 2015-11-23 ENCOUNTER — Telehealth: Payer: Self-pay | Admitting: *Deleted

## 2015-11-23 DIAGNOSIS — E785 Hyperlipidemia, unspecified: Secondary | ICD-10-CM | POA: Diagnosis not present

## 2015-11-23 DIAGNOSIS — I2510 Atherosclerotic heart disease of native coronary artery without angina pectoris: Secondary | ICD-10-CM

## 2015-11-23 LAB — LIPID PANEL
Cholesterol: 127 mg/dL (ref 125–200)
HDL: 48 mg/dL (ref >=46)
LDL CALC: 62 mg/dL (ref <130)
TRIGLYCERIDES: 83 mg/dL (ref <150)
Total CHOL/HDL Ratio: 2.6 Ratio (ref <=5.0)
VLDL: 17 mg/dL (ref <30)

## 2015-11-23 NOTE — Telephone Encounter (Signed)
Pt notified of lab results by phone with verbal understanding.  

## 2015-11-23 NOTE — Telephone Encounter (Signed)
Referral in

## 2015-11-24 ENCOUNTER — Telehealth: Payer: Self-pay

## 2015-11-24 NOTE — Telephone Encounter (Signed)
Pt was seen by Dr. Everlene Farrier on 12/28 for Colitis - Primary. He told her-she should retire and this would help with her medical condition. She would like Dr. Everlene Farrier to write her a note for this-stating she should retire for health reasons. This is what she wants. CB # [phone]

## 2015-11-25 NOTE — Telephone Encounter (Signed)
Please dictate a letter that due to coronary artery disease and a resistant case of colitis I have recommended Elizebath retire from work.

## 2015-11-27 NOTE — Telephone Encounter (Signed)
Patient states to place this letter on hold. She may be able to go out on disability. She is going to check with her HR department. If she does need the letter from Dr. Everlene Farrier she will call back.

## 2015-11-27 NOTE — Telephone Encounter (Signed)
Letter written and pended. Please print and sign.

## 2015-11-28 ENCOUNTER — Telehealth: Payer: Self-pay | Admitting: Emergency Medicine

## 2015-11-28 ENCOUNTER — Telehealth: Payer: Self-pay

## 2015-11-28 NOTE — Telephone Encounter (Signed)
Pt states she only want to speak with Dr Everlene Farrier. Please call [phone] and she only want Dr Everlene Farrier

## 2015-11-28 NOTE — Telephone Encounter (Signed)
Message left on patients answering machine for her to call back tomorrow regarding questions.

## 2015-11-29 ENCOUNTER — Encounter: Payer: BLUE CROSS/BLUE SHIELD | Attending: Emergency Medicine | Admitting: Skilled Nursing Facility1

## 2015-11-29 ENCOUNTER — Encounter: Payer: Self-pay | Admitting: Skilled Nursing Facility1

## 2015-11-29 VITALS — Ht 61.0 in | Wt 123.0 lb

## 2015-11-29 DIAGNOSIS — Z713 Dietary counseling and surveillance: Secondary | ICD-10-CM | POA: Insufficient documentation

## 2015-11-29 DIAGNOSIS — K51911 Ulcerative colitis, unspecified with rectal bleeding: Secondary | ICD-10-CM | POA: Diagnosis present

## 2015-11-29 NOTE — Telephone Encounter (Signed)
Patient returned call to Dr. Everlene Farrier,  She turned her ringer up so she will hear the phone.   [phone]

## 2015-11-29 NOTE — Telephone Encounter (Signed)
Spoke with pt. She wanted me let you know that she is unable to afford to retire. She talked to her company yesterday. She has 2 weeks left of her FMLA and if she doesn't go back, it will turn into an extended medical leave for up to 3 months. This is the route that she wants to go in hopes that she will continue to get better and be able to RTW in 3 months. Her company is either going to send her or [place] some more paperwork that will need to filled out for her job.

## 2015-11-29 NOTE — Telephone Encounter (Signed)
See previous phone message. 

## 2015-11-29 NOTE — Telephone Encounter (Signed)
We can try and contact patient today. I left a message on her answering machine.

## 2015-11-29 NOTE — Patient Instructions (Signed)
-  Chew your foods for 15-20 times -Try Kefir -Slowly introduce these foods back into your diet; one a week and pay attention to symptoms -Watch out for a lot of fat/grease -Lacinda Axon all your foods very well -If you wanted to try a protein powder you can -6 small meals a day -Be careful with lunch meat -Do not eat any fried foods -Try a fish recipe -Make sure you are drinking enough water -Keep a food journal with symptoms

## 2015-11-29 NOTE — Telephone Encounter (Signed)
Call patient we will fill out the paperwork once we have it.

## 2015-11-29 NOTE — Progress Notes (Signed)
  Medical Nutrition Therapy:  Appt start time: 1400 end time:  1500.   Assessment:  Primary concerns today: referred for colitis. Pt states she had a terrible bout of colitis involving a visit to the ED, excessive vomiting/dirhhea, and blood from colon. Pt states she is week with no energy. Pt states she has been constipated for 5 days. Pt states she is currently taking a steroid. Pt states she enjoys ballroom dancing and wants to get back into it when she feels better.  Preferred Learning Style:   No preference indicated   Learning Readiness:   Change in progress   MEDICATIONS: See List   DIETARY INTAKE:  Usual eating pattern includes 2 meals and 0 snacks per day.  Everyday foods include same listed.  Avoided foods include many.    24-hr recall:  B ( AM): english muffin with nondairy butter----sometimes 1 egg Snk ( AM): none L ( PM): none Snk ( PM): none D ( PM): white rice and chicken tenders-----progresso soup---1 hamburger patty Snk ( PM): none Beverages: coffee, decaf coffee, decaf tea  Usual physical activity: none  Estimated energy needs: 1800 calories 200 g carbohydrates 135 g protein 50 g fat  Progress Towards Goal(s):  In progress.   Nutritional Diagnosis:  NI-5.11.1 Predicted suboptimal nutrient intake As related to newly diagnosed colitis.  As evidenced by extreme lethargic, constipation, pt report, and 24 hr recall..    Intervention:  Nutrition counseling for colitis. Dietitian educated the pt on fluid needs, nutrient needs, and foods/nutrients to avoid. Goals: Chew your foods for 15-20 times -Try Kefir -Slowly introduce these foods back into your diet; one a week and pay attention to symptoms -Watch out for a lot of fat/grease -Lacinda Axon all your foods very well -If you wanted to try a protein powder you can -6 small meals a day -Be careful with lunch meat -Do not eat any fried foods -Try a fish recipe -Make sure you are drinking enough water -Keep a  food journal with symptoms  Teaching Method Utilized:  Visual Auditory  Handouts given during visit include:  Colitis handouts  Barriers to learning/adherence to lifestyle change: interindividual variation  Demonstrated degree of understanding via:  Teach Back   Monitoring/Evaluation:  Dietary intake, exercise, colitis Symptoms, and body weight prn.

## 2015-11-30 ENCOUNTER — Encounter: Payer: Self-pay | Admitting: *Deleted

## 2015-11-30 NOTE — Telephone Encounter (Signed)
Spoke with pt, advised message from Dr. Daub. 

## 2015-12-06 ENCOUNTER — Other Ambulatory Visit: Payer: Self-pay

## 2015-12-06 MED ORDER — ALPRAZOLAM 0.25 MG PO TABS: ORAL_TABLET | ORAL | Status: DC

## 2015-12-06 NOTE — Telephone Encounter (Signed)
Pharm faxed request for RFs on alprazolam. Pended.

## 2015-12-07 NOTE — Telephone Encounter (Signed)
Called in.

## 2015-12-08 ENCOUNTER — Ambulatory Visit: Payer: BLUE CROSS/BLUE SHIELD | Admitting: *Deleted

## 2015-12-13 ENCOUNTER — Ambulatory Visit (INDEPENDENT_AMBULATORY_CARE_PROVIDER_SITE_OTHER): Payer: BLUE CROSS/BLUE SHIELD | Admitting: Emergency Medicine

## 2015-12-13 VITALS — BP 120/64 | HR 79 | Temp 98.2°F | Resp 16 | Ht 61.0 in | Wt 124.8 lb

## 2015-12-13 DIAGNOSIS — K529 Noninfective gastroenteritis and colitis, unspecified: Secondary | ICD-10-CM

## 2015-12-13 DIAGNOSIS — R634 Abnormal weight loss: Secondary | ICD-10-CM

## 2015-12-13 DIAGNOSIS — R1084 Generalized abdominal pain: Secondary | ICD-10-CM | POA: Diagnosis not present

## 2015-12-13 NOTE — Progress Notes (Signed)
By signing my name below, I, Raven Small, attest that this documentation has been prepared under the direction and in the presence of [name], MD.  Electronically Signed: Thea Alken, ED Scribe. 12/13/2015. 10:14 AM.  Chief Complaint:  Chief Complaint  Patient presents with  . Follow-up    Wants to be released for work is possible    HPI: Kristine [name] is a 75 y.o. female who reports to [facility] today for a follow up. Pt has hx of severe colitis, followed by Dr. Amedeo Plenty. She reports appetite has improved. She has adjusted her diet, cut out sugar and has been reading a book for guidance "Breaking the Vicious Cycle, intestinal health through diet'". States she has gained 3lbs. She would like a referral to another GI physician. She does not feel as if her current GI is not a good fit. She has seen a nutritionist in the past which she believes has helped. She has also brought FMLA paperwork.   Wt Readings from Last 3 Encounters:  12/13/15 124 lb 12.8 oz (56.609 kg)  11/29/15 123 lb (55.792 kg)  11/22/15 123 lb 12.8 oz (56.155 kg)   Past Medical History  Diagnosis Date  . GERD (gastroesophageal reflux disease)   . Barrett's esophagus     Stage B  . Cystocele   . Rectocele     mild  . Hypertension   . Diverticulitis   . Arthritis   . Raynaud disease   . CREST variant of scleroderma ([place])   . Osteopenia 09/2012    T score -2.4 FRAX 13%/2.7%  per 10/15/2012 discussion plan repeat in 2 years  . Anemia   . CAD (coronary artery disease)     a. s/p STEMI 7/14 >> LHC: LM 20, mLAD 30-40, small Dx 100 (L-L collats), OM1 90, OM2 30-40, mRCA 50-60, Lat/AL akinesis, EF 35-40% >> PCI: Promus DES to OM1;  b. Myoview 3/16: diaph atten, no ischemia, EF 59% (intermediate risk b/c BP drop 152>>137, but recovery BP 162 (tech error) >> med Rx continued   . Hyperlipidemia   . Ischemic cardiomyopathy     a. EF 35-40% by LHC at time of MI;  b. Echo 7/14:  Mild focal basal septal hypertrophy, EF 50-55%,  apical HK, grade 1 diastolic dysfunction, trivial AI   . History of Doppler ultrasound     a. Carotid US 2/12: bilateral ICA < 50%   Past Surgical History  Procedure Laterality Date  . Tonsillectomy  1950  . Tubal ligation  1980  . Nissen fundoplication  123456  . Total knee arthroplasty      right  . Joint replacement  9/10    rt total  . Foot surgery  2013    x2  . Abdominal hysterectomy  1996    RSO for menorrhagia.  . Coronary angioplasty with stent placement  06/21/2013   Social History   Social History  . Marital Status: Divorced    Spouse Name: N/A  . Number of Children: N/A  . Years of Education: N/A   Social History Main Topics  . Smoking status: Never Smoker   . Smokeless tobacco: Never Used  . Alcohol Use: 0.0 oz/week    0 Standard drinks or equivalent per week     Comment: Occasional  . Drug Use: No  . Sexual Activity: No   Other Topics Concern  . None   Social History Narrative   Exercise dancing 3 x times weekly for 1-2 hours  Family History  Problem Relation Age of Onset  . Cancer Mother     Pancreatic Cancer  . Hepatitis C Mother   . Heart disease Father     congestive heart failure  . Panic disorder Daughter   . Asthma Maternal Grandmother   . Emphysema Maternal Grandmother   . Cancer Maternal Grandfather     lung  . Heart attack Father   . Stroke Neg Hx    Allergies  Allergen Reactions  . Aspirin Other (See Comments)    Pt is prone to bleeding, ok with low dose coated aspirin  . Flagyl [Metronidazole]     headaches  . Nsaids Other (See Comments)    Pt is prone to bleeding  . Pravastatin Sodium     Other reaction(s): Other (See Comments) elevated LFT'S, tolerates 1/2 dose of zetia and 1/2 dose of pravastatin  . Statins Other (See Comments)    elevated LFT'S, tolerates 1/2 dose of zetia and 1/2 dose of pravastatin   Prior to Admission medications   Medication Sig Start Date End Date Taking? Authorizing Provider  ALPRAZolam Duanne Moron)  0.25 MG tablet Take 1/2-1 tablet as needed for anxiety 12/06/15  Yes Darlyne Russian, MD  aspirin EC 81 MG EC tablet Take 1 tablet (81 mg total) by mouth daily. 06/24/13  Yes Rhonda G Barrett, PA-C  budesonide (ENTOCORT EC) 3 MG 24 hr capsule Take 3 mg by mouth 3 (three) times daily.   Yes Historical Provider, MD  ezetimibe (ZETIA) 10 MG tablet Take 0.5 tablets (5 mg total) by mouth daily. 11/16/15  Yes [name], MD  losartan (COZAAR) 50 MG tablet Take 0.5 tablets (25 mg total) by mouth daily. 10/12/15  Yes Florencia Reasons, MD  Multiple Vitamin (MULTIVITAMIN WITH MINERALS) TABS tablet Take 1 tablet by mouth daily.   Yes Historical Provider, MD  nitroGLYCERIN (NITROSTAT) 0.4 MG SL tablet Place 1 tablet (0.4 mg total) under the tongue every 5 (five) minutes x 3 doses as needed for chest pain. 11/14/15  Yes Scott Joylene Draft, PA-C  omeprazole (PRILOSEC) 20 MG capsule Take 20 mg by mouth 2 (two) times daily. 05/03/15 05/02/16 Yes Historical Provider, MD  pravastatin (PRAVACHOL) 10 MG tablet TAKE 1 TABLET (10 MG TOTAL) BY MOUTH EVERY EVENING. 10/13/15  Yes [name], MD  albuterol (PROVENTIL HFA;VENTOLIN HFA) 108 (90 BASE) MCG/ACT inhaler Inhale 2 puffs into the lungs every 4 (four) hours as needed for wheezing or shortness of breath (cough, shortness of breath or wheezing.). Patient not taking: Reported on 11/22/2015 11/01/15   Darlyne Russian, MD  KRILL OIL PO Take 1 capsule by mouth daily. Reported on 12/13/2015    Historical Provider, MD  loperamide (IMODIUM) 2 MG capsule Take 1 capsule (2 mg total) by mouth 4 (four) times daily as needed for diarrhea or loose stools. Patient not taking: Reported on 11/22/2015 11/01/15   Kristen N Ward, DO     ROS: The patient denies fevers, chills, night sweats, unintentional weight loss, chest pain, palpitations, wheezing, dyspnea on exertion, nausea, vomiting, abdominal pain, dysuria, hematuria, melena, numbness, weakness, or tingling.   All other systems have been reviewed  and were otherwise negative with the exception of those mentioned in the HPI and as above.    PHYSICAL EXAM: Filed Vitals:   12/13/15 0901  BP: 120/64  Pulse: 79  Temp: 98.2 F (36.8 C)  Resp: 16   Body mass index is 23.59 kg/(m^2).   General: Alert, no acute distress HEENT:  Normocephalic, atraumatic, oropharynx patent. Eye: Juliette Mangle [facility] Cardiovascular:  Regular rate and rhythm, no rubs murmurs or gallops.  No Carotid bruits, radial pulse intact. No pedal edema.  Respiratory: Clear to auscultation bilaterally.  No wheezes, rales, or rhonchi.  No cyanosis, no use of accessory musculature Abdominal: No organomegaly, abdomen is soft and non-tender, positive bowel sounds.  No masses. Musculoskeletal: Gait intact. No edema, tenderness Skin: No rashes. Neurologic: Facial musculature symmetric. Psychiatric: Patient acts appropriately throughout our interaction. Lymphatic: No cervical or submandibular lymphadenopathy    LABS:    EKG/XRAY:   Primary read interpreted by Dr. Everlene Farrier at [facility].   ASSESSMENT/PLAN: She is doing much better on her special diet. She is not ready to return to work as her weight has not started to rebound back yet. I told her I would see her ding work. I told her in the next month she needed to gain between 2 and 4 pounds.I personally performed the services described in this documentation, which was scribed in my presence. The recorded information has been reviewed and is accurate.her disability papers were changed with estimated return to work date up to 01/15/2016   Gross sideeffects, risk and benefits, and alternatives of medications d/w patient. Patient is aware that all medications have potential sideeffects and we are unable to predict every sideeffect or drug-drug interaction that may occur.  [name] MD 12/13/2015 10:13 AM

## 2015-12-14 ENCOUNTER — Encounter: Payer: BLUE CROSS/BLUE SHIELD | Admitting: Skilled Nursing Facility1

## 2015-12-14 ENCOUNTER — Encounter: Payer: Self-pay | Admitting: Skilled Nursing Facility1

## 2015-12-14 VITALS — Ht 61.0 in | Wt 125.7 lb

## 2015-12-14 DIAGNOSIS — K51911 Ulcerative colitis, unspecified with rectal bleeding: Secondary | ICD-10-CM | POA: Diagnosis not present

## 2015-12-14 DIAGNOSIS — K5190 Ulcerative colitis, unspecified, without complications: Secondary | ICD-10-CM

## 2015-12-14 NOTE — Progress Notes (Signed)
  Medical Nutrition Therapy:  Appt start time: 1400 end time:  1500.   Assessment:  Primary concerns today: follow up from late December for colitis. PT is doing great! Pt has gained 2 pounds since her last visit. Increased wt gain in the coming weeks is expected. Pt complains of No GI symptoms. Pt is keeping an extensive food journal with symptoms logged. Energy level: somewhat better, she can do household chores without being exhausted now.   3-5 16.9 ounces of water but for the last couple days she was only having one of those which resulted in constipation.   Foods she is eating without GI upset:  Ham Canned spinach Curds and apple butter  Cheddar cheese Hamburger Regular jello Rice checks/rice crispies with banana and almond milk-dark chocolate Coffee Egg with low fat mayo with mustard Wedding soup with saltines Tuna salad and mayo English muffin with peanut butter and fruit spread Boxed poatoes Lean pork chop [place] toast with cinnamon Canned peaches Shredded iceburg lettuce Coconut milk yogurt Munk fish Almond flour muffin Melon Chocolate covered bananas   Green beans did not go well: some cramping   Food occurences 6 a day   Preferred Learning Style:   No preference indicated   Learning Readiness:   Change in progress   MEDICATIONS: See List  Usual physical activity: none  Estimated energy needs: 1800 calories 200 g carbohydrates 135 g protein 50 g fat  Progress Towards Goal(s):  In progress.   Nutritional Diagnosis:  NI-5.11.1 Predicted suboptimal nutrient intake As related to newly diagnosed colitis.  As evidenced by extreme lethargic, constipation, pt report, and 24 hr recall..    Intervention:  Nutrition counseling for colitis. Dietitian educated the pt on fluid needs, nutrient needs, and foods/nutrients to avoid. Goals: Chew your foods for 15-20 times -Slowly introduce these foods back into your diet; pay attention to symptoms -Watch out  for a lot of fat/grease -Lacinda Axon all your foods very well -If you wanted to try a protein powder you can -6 small meals a day -Be careful with lunch meat -Do not eat any fried foods -Try a fish recipe -Make sure you are drinking enough water, drink at least 3 of your bottles -Keep a food journal with symptoms  Teaching Method Utilized:  Visual Auditory  Handouts given during visit include:  Colitis handouts  Barriers to learning/adherence to lifestyle change: interindividual variation  Demonstrated degree of understanding via:  Teach Back   Monitoring/Evaluation:  Dietary intake, exercise, colitis Symptoms, and body weight prn.

## 2015-12-18 ENCOUNTER — Telehealth: Payer: Self-pay

## 2015-12-18 DIAGNOSIS — K529 Noninfective gastroenteritis and colitis, unspecified: Secondary | ICD-10-CM

## 2015-12-18 NOTE — Telephone Encounter (Signed)
Pt called in and states that she received a letter from her employer that they are putting her on a extended major medical leave as of 12/20/15 and they need more certification from Dr Everlene Farrier. It states she may lose her job. She would like Dr Everlene Farrier to call her concerning this matter as soon as possible. She can be reached @ [phone] Thank you

## 2015-12-20 NOTE — Telephone Encounter (Signed)
Spoke with pt, she just wants a referral for a new gastroenterologist and a new prescription for ulcerative colitis. She cannot afford the medicine Dr. Amedeo Plenty gave her. She is not happy with him. SHe is all good with the paperwork and does not need anything in regards to that.

## 2015-12-20 NOTE — Telephone Encounter (Signed)
Call Estill Bamberg and let her know I will call her later this afternoon to discuss her work situation.

## 2015-12-20 NOTE — Telephone Encounter (Signed)
Please place a referral for patient to see Dr. Earlean Shawl. See if we can get her in pretty quickly. It would not be a good idea for me to put her on a different medication. She needs to try and stay on the medication she was previously on until she sees the new GI doctor.

## 2015-12-21 NOTE — Telephone Encounter (Signed)
Spoke with pt, advised message. Pt understood. Referrals can we work on this for pt.

## 2015-12-22 NOTE — Telephone Encounter (Signed)
Done

## 2015-12-22 NOTE — Telephone Encounter (Signed)
Please place referral in EPIC so that I may send the patient's notes to [place].  Thank you.

## 2015-12-28 ENCOUNTER — Ambulatory Visit (INDEPENDENT_AMBULATORY_CARE_PROVIDER_SITE_OTHER): Payer: BLUE CROSS/BLUE SHIELD | Admitting: Emergency Medicine

## 2015-12-28 ENCOUNTER — Telehealth: Payer: Self-pay

## 2015-12-28 ENCOUNTER — Encounter: Payer: Self-pay | Admitting: Emergency Medicine

## 2015-12-28 VITALS — BP 116/72 | HR 62 | Temp 98.2°F | Resp 18 | Ht 61.0 in | Wt 123.4 lb

## 2015-12-28 DIAGNOSIS — R634 Abnormal weight loss: Secondary | ICD-10-CM

## 2015-12-28 DIAGNOSIS — K529 Noninfective gastroenteritis and colitis, unspecified: Secondary | ICD-10-CM

## 2015-12-28 DIAGNOSIS — R1084 Generalized abdominal pain: Secondary | ICD-10-CM

## 2015-12-28 DIAGNOSIS — K5289 Other specified noninfective gastroenteritis and colitis: Secondary | ICD-10-CM

## 2015-12-28 MED ORDER — ALPRAZOLAM 0.25 MG PO TABS: ORAL_TABLET | ORAL | Status: DC

## 2015-12-28 NOTE — Telephone Encounter (Signed)
Dr Everlene Farrier   Patient has an appointment with you this afternoon.   Dr. Sarina Ser is not a good referral.  Patient needs an MD with hospital access with two or more doctors in practice.   She is hoping you will have this information for her when she sees you at 4:15.

## 2015-12-28 NOTE — Telephone Encounter (Signed)
We'll discuss at follow-up visit

## 2015-12-28 NOTE — Progress Notes (Addendum)
By signing my name below, I, [name], attest that this documentation has been prepared under the direction and in the presence of [name], MD. Electronically Signed: [name], [employer]. 12/28/2015 , 4:45 PM .  Patient was seen in room 23 .  Chief Complaint:  Chief Complaint  Patient presents with  . Return to Work    Pt. on her last week of PAL and needs to know if she can return to work soon.     HPI: Kristine [name] is a 75 y.o. female who reports to [facility] today need to know if she can return to work. She is on her last week of PAL. She rates herself, on a scale of 1-10 (1 being worst, and 10 being best), being about 6. She wants to return to work due to financial issues, and also believes it would be better on her stress.   GI doctor She finally heard back from Dr. Liliane Channel office but the nurse suggested her to follow with another practice.  She refuses to see Dr. Amedeo Plenty due to complications.  She was recommended to see Dr. Collene Mares for GI.   Nutrition She likes seeing the nutritionist. She's been able to eat better.   Weight She notes that her weight went up to 125 but had a series of abdominal complications which caused her weight to drop back down to 122.   Abdominal pain She states that her diarrhea and cramping abdomen pain worsens when she's stressed. Last week, she had bad diarrhea with hematuria and terrible cramping abdominal pain.   Past Medical History  Diagnosis Date  . GERD (gastroesophageal reflux disease)   . Barrett's esophagus     Stage B  . Cystocele   . Rectocele     mild  . Hypertension   . Diverticulitis   . Arthritis   . Raynaud disease   . CREST variant of scleroderma ([place])   . Osteopenia 09/2012    T score -2.4 FRAX 13%/2.7%  per 10/15/2012 discussion plan repeat in 2 years  . Anemia   . CAD (coronary artery disease)     a. s/p STEMI 7/14 >> LHC: LM 20, mLAD 30-40, small Dx 100 (L-L collats), OM1 90, OM2 30-40, mRCA 50-60, Lat/AL  akinesis, EF 35-40% >> PCI: Promus DES to OM1;  b. Myoview 3/16: diaph atten, no ischemia, EF 59% (intermediate risk b/c BP drop 152>>137, but recovery BP 162 (tech error) >> med Rx continued   . Hyperlipidemia   . Ischemic cardiomyopathy     a. EF 35-40% by LHC at time of MI;  b. Echo 7/14:  Mild focal basal septal hypertrophy, EF 50-55%, apical HK, grade 1 diastolic dysfunction, trivial AI   . History of Doppler ultrasound     a. Carotid US 2/12: bilateral ICA < 50%   Past Surgical History  Procedure Laterality Date  . Tonsillectomy  1950  . Tubal ligation  1980  . Nissen fundoplication  123456  . Total knee arthroplasty      right  . Joint replacement  9/10    rt total  . Foot surgery  2013    x2  . Abdominal hysterectomy  1996    RSO for menorrhagia.  . Coronary angioplasty with stent placement  06/21/2013   Social History   Social History  . Marital Status: Divorced    Spouse Name: N/A  . Number of Children: N/A  . Years of Education: N/A   Social History Main  Topics  . Smoking status: Never Smoker   . Smokeless tobacco: Never Used  . Alcohol Use: 0.0 oz/week    0 Standard drinks or equivalent per week     Comment: Occasional  . Drug Use: No  . Sexual Activity: No   Other Topics Concern  . None   Social History Narrative   Exercise dancing 3 x times weekly for 1-2 hours   Family History  Problem Relation Age of Onset  . Cancer Mother     Pancreatic Cancer  . Hepatitis C Mother   . Heart disease Father     congestive heart failure  . Panic disorder Daughter   . Asthma Maternal Grandmother   . Emphysema Maternal Grandmother   . Cancer Maternal Grandfather     lung  . Heart attack Father   . Stroke Neg Hx    Allergies  Allergen Reactions  . Aspirin Other (See Comments)    Pt is prone to bleeding, ok with low dose coated aspirin  . Flagyl [Metronidazole]     headaches  . Nsaids Other (See Comments)    Pt is prone to bleeding  . Pravastatin Sodium      Other reaction(s): Other (See Comments) elevated LFT'S, tolerates 1/2 dose of zetia and 1/2 dose of pravastatin  . Statins Other (See Comments)    elevated LFT'S, tolerates 1/2 dose of zetia and 1/2 dose of pravastatin   Prior to Admission medications   Medication Sig Start Date End Date Taking? Authorizing Provider  albuterol (PROVENTIL HFA;VENTOLIN HFA) 108 (90 BASE) MCG/ACT inhaler Inhale 2 puffs into the lungs every 4 (four) hours as needed for wheezing or shortness of breath (cough, shortness of breath or wheezing.). Patient not taking: Reported on 11/22/2015 11/01/15   Darlyne Russian, MD  ALPRAZolam Duanne Moron) 0.25 MG tablet Take 1/2-1 tablet as needed for anxiety 12/06/15   Darlyne Russian, MD  aspirin EC 81 MG EC tablet Take 1 tablet (81 mg total) by mouth daily. 06/24/13   Rhonda G Barrett, PA-C  budesonide (ENTOCORT EC) 3 MG 24 hr capsule Take 3 mg by mouth 3 (three) times daily.    Historical Provider, MD  ezetimibe (ZETIA) 10 MG tablet Take 0.5 tablets (5 mg total) by mouth daily. 11/16/15   [name], MD  KRILL OIL PO Take 1 capsule by mouth daily. Reported on 12/13/2015    Historical Provider, MD  loperamide (IMODIUM) 2 MG capsule Take 1 capsule (2 mg total) by mouth 4 (four) times daily as needed for diarrhea or loose stools. Patient not taking: Reported on 11/22/2015 11/01/15   Kristen N Ward, DO  losartan (COZAAR) 50 MG tablet Take 0.5 tablets (25 mg total) by mouth daily. 10/12/15   Florencia Reasons, MD  Multiple Vitamin (MULTIVITAMIN WITH MINERALS) TABS tablet Take 1 tablet by mouth daily.    Historical Provider, MD  nitroGLYCERIN (NITROSTAT) 0.4 MG SL tablet Place 1 tablet (0.4 mg total) under the tongue every 5 (five) minutes x 3 doses as needed for chest pain. 11/14/15   Liliane Shi, PA-C  omeprazole (PRILOSEC) 20 MG capsule Take 20 mg by mouth 2 (two) times daily. 05/03/15 05/02/16  Historical Provider, MD  pravastatin (PRAVACHOL) 10 MG tablet TAKE 1 TABLET (10 MG TOTAL) BY MOUTH EVERY  EVENING. 10/13/15   [name], MD     ROS:  Constitutional: negative for fever, chills, night sweats, weight changes, or fatigue  HEENT: negative for vision changes, hearing loss, congestion, rhinorrhea,  ST, epistaxis, or sinus pressure Cardiovascular: negative for chest pain or palpitations Respiratory: negative for hemoptysis, wheezing, shortness of breath, or cough Abdominal: negative for nausea, vomiting, or constipation; positive for abd pain, and diarrhea GU: positive for hematuria Dermatological: negative for rash Neurologic: negative for headache, dizziness, or syncope All other systems reviewed and are otherwise negative with the exception to those above and in the HPI.  PHYSICAL EXAM: Filed Vitals:   12/28/15 1607  BP: 116/72  Pulse: 62  Temp: 98.2 F (36.8 C)  Resp: 18   Body mass index is 23.33 kg/(m^2).   General: Alert, no acute distress HEENT:  Normocephalic, atraumatic, oropharynx patent. Eye: Juliette Mangle [facility] Cardiovascular:  Regular rate and rhythm, no rubs murmurs or gallops.  No Carotid bruits, radial pulse intact. No pedal edema.  Respiratory: Clear to auscultation bilaterally.  No wheezes, rales, or rhonchi.  No cyanosis, no use of accessory musculature Abdominal: No organomegaly, abdomen is soft and non-tender, positive bowel sounds. No masses. Musculoskeletal: Gait intact. No edema, tenderness Skin: No rashes. Neurologic: Facial musculature symmetric. Psychiatric: Patient acts appropriately throughout our interaction.  Lymphatic: No cervical or submandibular lymphadenopathy Genitourinary/Anorectal: No acute findings   LABS:   EKG/XRAY:   Primary read interpreted by Dr. Everlene Farrier at [facility].   ASSESSMENT/PLAN: Patient carries a diagnosis of ulcerative colitis. She is in the donut hole and cannot afford the medications prescribed by Dr. Amedeo Plenty. She is requesting referral to Dr. Collene Mares for her evaluation. She continues to be symptomatic with her ulcerative  colitis. Referral placed to Dr. Collene Mares to see if we can get her seen quickly. She is having recurrent episodes of cramps associated with loose stools. Last CT was in December and showed persistent thickening of the transverse and descending colon consistent with colitis.I personally performed the services described in this documentation, which was scribed in my presence. The recorded information has been reviewed and is accurate. I decided to let her go back to work Monday. I will try and contact Dr. Collene Mares to see if she can take her on his patient.   Gross sideeffects, risk and benefits, and alternatives of medications d/w patient. Patient is aware that all medications have potential sideeffects and we are unable to predict every sideeffect or drug-drug interaction that may occur.  [name] MD 12/28/2015 4:45 PM

## 2015-12-29 ENCOUNTER — Telehealth: Payer: Self-pay

## 2015-12-29 ENCOUNTER — Other Ambulatory Visit (INDEPENDENT_AMBULATORY_CARE_PROVIDER_SITE_OTHER): Payer: BLUE CROSS/BLUE SHIELD

## 2015-12-29 ENCOUNTER — Telehealth: Payer: Self-pay | Admitting: Family Medicine

## 2015-12-29 DIAGNOSIS — R1084 Generalized abdominal pain: Secondary | ICD-10-CM

## 2015-12-29 LAB — POC MICROSCOPIC URINALYSIS (UMFC): Mucus: ABSENT

## 2015-12-29 LAB — POCT URINALYSIS DIP (MANUAL ENTRY)
BILIRUBIN UA: NEGATIVE
Bilirubin, UA: NEGATIVE
Blood, UA: NEGATIVE
Glucose, UA: NEGATIVE
NITRITE UA: NEGATIVE
PH UA: 6.5
PROTEIN UA: NEGATIVE
SPEC GRAV UA: 1.015
UROBILINOGEN UA: 0.2

## 2015-12-29 NOTE — Telephone Encounter (Signed)
Dr. Everlene Farrier,  Please see previous note

## 2015-12-29 NOTE — Telephone Encounter (Signed)
Kristine [name] - Pt says her company is fine with her working half days.  They mentioned that if she went up to

## 2015-12-29 NOTE — Telephone Encounter (Signed)
Is someone in referrals able to help. Please see previous message

## 2015-12-29 NOTE — Telephone Encounter (Signed)
Left VM for pt to call back regarding urine specimen. Pt brought urine specimen in to have a ua micro ran and urine culture sent. Specimen was not brought in a sterile container.

## 2015-12-29 NOTE — Addendum Note (Signed)
Addended by: Frutoso Chase A on: 12/29/2015 05:47 PM   Modules accepted: Orders

## 2015-12-29 NOTE — Telephone Encounter (Signed)
Patient requesting referrals to send her medical records over to Dr. Youlanda Mighty office per Dr. Everlene Farrier.

## 2015-12-29 NOTE — Telephone Encounter (Signed)
lmom for pt to make an appt to see Dr Collene Mares per Dr Everlene Farrier

## 2015-12-29 NOTE — Telephone Encounter (Signed)
DAUB - Pt said her company is fine with her working half days.  If she goes to 6 or 8 hors days she will need a note.  She wants to know if she can get a note saying she can work 6 hours just in case you are in [place] when she needs the note. [phone]

## 2015-12-30 LAB — URINE CULTURE

## 2015-12-30 NOTE — Telephone Encounter (Signed)
Stay at 4 hours per day at least for 2 weeks

## 2016-01-01 NOTE — Telephone Encounter (Signed)
Spoke to patient and advised her to stay at 4 hours per day for the next two weeks per Dr. Everlene Farrier.  She said she will do so.

## 2016-01-01 NOTE — Telephone Encounter (Signed)
The referral has been sent to [facility] on 12/29/15.  They will contact the patient for an appointment once they review the records.

## 2016-01-04 NOTE — Telephone Encounter (Signed)
Pt wants referral to Dr. Lerry Liner (?) [facility] [place].  [phone]

## 2016-01-08 ENCOUNTER — Encounter: Payer: Self-pay | Admitting: *Deleted

## 2016-01-08 ENCOUNTER — Telehealth: Payer: Self-pay | Admitting: *Deleted

## 2016-01-08 NOTE — Telephone Encounter (Signed)
Pt would like to go over to [place].  I called and Velora Heckler said that they need her records from [place] and the Dr will review.  She will call [facility].

## 2016-01-08 NOTE — Telephone Encounter (Signed)
This is fine for the next 2 weeks.  Please write the note to clear her for 6 hours.  Is she following up with  GI or [facility] at this time for this colitis?

## 2016-01-08 NOTE — Telephone Encounter (Signed)
LMOM that note is ready for pickup

## 2016-01-08 NOTE — Telephone Encounter (Signed)
Pt states Dr Lorie Apley office called to say they can't take her and needed her to call us so we can explain why. She is really distressed that she hasn't heard from anyone. Please call [phone]

## 2016-01-08 NOTE — Telephone Encounter (Signed)
Pt needs a not to return to work for 6 hours per day for 1-2 weeks.  She requested this 2 weeks ago, but Dr. Everlene Farrier wanted her to stay on 4 hours.  Can we get her note please?

## 2016-01-09 ENCOUNTER — Telehealth: Payer: Self-pay

## 2016-01-09 NOTE — Telephone Encounter (Signed)
Patient scheduled her own appointment at [facility].  Will send referral documents to that office.

## 2016-01-09 NOTE — Telephone Encounter (Signed)
Pt would like a referral to see   Dhvan Rajan for ulcerative colitis. She has already made an appointment for 3/6 at 2:15. I tried to explain the referral process to her.CB # [phone]

## 2016-01-09 NOTE — Telephone Encounter (Signed)
Patient has an open gastroenterology referral.  Will send referral to [facility] for that provider.

## 2016-01-17 ENCOUNTER — Ambulatory Visit (INDEPENDENT_AMBULATORY_CARE_PROVIDER_SITE_OTHER): Payer: BLUE CROSS/BLUE SHIELD | Admitting: Emergency Medicine

## 2016-01-17 VITALS — BP 124/64 | HR 79 | Temp 98.0°F | Resp 16 | Ht 61.0 in | Wt 126.8 lb

## 2016-01-17 DIAGNOSIS — K529 Noninfective gastroenteritis and colitis, unspecified: Secondary | ICD-10-CM

## 2016-01-17 DIAGNOSIS — R634 Abnormal weight loss: Secondary | ICD-10-CM | POA: Diagnosis not present

## 2016-01-17 NOTE — Progress Notes (Signed)
By signing my name below, I, [name], attest that this documentation has been prepared under the direction and in the presence of [name], MD. Electronically Signed: [name], [place]. 01/17/2016 , 8:30 AM .  Patient was seen in room 1 .  Chief Complaint:  Chief Complaint  Patient presents with  . Follow-up    pt would like to go back to work full time    HPI: Kristine [name] is a 75 y.o. female who has h/o colitis reports to [facility] today for follow up. She would like to return to full time back to work. She's been back to part time, and has been doing well. She wants to return to work at 8 hours. She mentions having 2-3 BM a day. She denies any blood or mucus in her stool.   She's had problems with setting up appointments with GI: Dr. Earlean Shawl wouldn't take her because his office mentioned to her that he's "a 1 doctor practice".  Dr. Collene Mares hasn't seen her due to communication errors between our offices.   She was able to set up an appointment with specialist at [facility] in [place] (Dr. Weyman Croon) on March 6th.   Past Medical History  Diagnosis Date  . GERD (gastroesophageal reflux disease)   . Barrett's esophagus     Stage B  . Cystocele   . Rectocele     mild  . Hypertension   . Diverticulitis   . Arthritis   . Raynaud disease   . CREST variant of scleroderma ([place])   . Osteopenia 09/2012    T score -2.4 FRAX 13%/2.7%  per 10/15/2012 discussion plan repeat in 2 years  . Anemia   . CAD (coronary artery disease)     a. s/p STEMI 7/14 >> LHC: LM 20, mLAD 30-40, small Dx 100 (L-L collats), OM1 90, OM2 30-40, mRCA 50-60, Lat/AL akinesis, EF 35-40% >> PCI: Promus DES to OM1;  b. Myoview 3/16: diaph atten, no ischemia, EF 59% (intermediate risk b/c BP drop 152>>137, but recovery BP 162 (tech error) >> med Rx continued   . Hyperlipidemia   . Ischemic cardiomyopathy     a. EF 35-40% by LHC at time of MI;  b. Echo 7/14:  Mild focal basal septal  hypertrophy, EF 50-55%, apical HK, grade 1 diastolic dysfunction, trivial AI   . History of Doppler ultrasound     a. Carotid US 2/12: bilateral ICA < 50%   Past Surgical History  Procedure Laterality Date  . Tonsillectomy  1950  . Tubal ligation  1980  . Nissen fundoplication  123456  . Total knee arthroplasty      right  . Joint replacement  9/10    rt total  . Foot surgery  2013    x2  . Abdominal hysterectomy  1996    RSO for menorrhagia.  . Coronary angioplasty with stent placement  06/21/2013   Social History   Social History  . Marital Status: Divorced    Spouse Name: N/A  . Number of Children: N/A  . Years of Education: N/A   Social History Main Topics  . Smoking status: Never Smoker   . Smokeless tobacco: Never Used  . Alcohol Use: 0.0 oz/week    0 Standard drinks or equivalent per week     Comment: Occasional  . Drug Use: No  . Sexual Activity: No   Other Topics Concern  . None   Social History Narrative   Exercise dancing  3 x times weekly for 1-2 hours   Family History  Problem Relation Age of Onset  . Cancer Mother     Pancreatic Cancer  . Hepatitis C Mother   . Heart disease Father     congestive heart failure  . Panic disorder Daughter   . Asthma Maternal Grandmother   . Emphysema Maternal Grandmother   . Cancer Maternal Grandfather     lung  . Heart attack Father   . Stroke Neg Hx    Allergies  Allergen Reactions  . Aspirin Other (See Comments)    Pt is prone to bleeding, ok with low dose coated aspirin  . Flagyl [Metronidazole]     headaches  . Nsaids Other (See Comments)    Pt is prone to bleeding  . Pravastatin Sodium     Other reaction(s): Other (See Comments) elevated LFT'S, tolerates 1/2 dose of zetia and 1/2 dose of pravastatin  . Statins Other (See Comments)    elevated LFT'S, tolerates 1/2 dose of zetia and 1/2 dose of pravastatin   Prior to Admission medications   Medication Sig Start Date End Date Taking? Authorizing  Provider  ALPRAZolam Duanne Moron) 0.25 MG tablet Take 1/2-1 tablet as needed for anxiety 12/28/15  Yes Darlyne Russian, MD  aspirin EC 81 MG EC tablet Take 1 tablet (81 mg total) by mouth daily. 06/24/13  Yes Rhonda G Barrett, PA-C  ezetimibe (ZETIA) 10 MG tablet Take 0.5 tablets (5 mg total) by mouth daily. 11/16/15  Yes [name], MD  losartan (COZAAR) 50 MG tablet Take 0.5 tablets (25 mg total) by mouth daily. 10/12/15  Yes Florencia Reasons, MD  Multiple Vitamin (MULTIVITAMIN WITH MINERALS) TABS tablet Take 1 tablet by mouth daily.   Yes Historical Provider, MD  nitroGLYCERIN (NITROSTAT) 0.4 MG SL tablet Place 1 tablet (0.4 mg total) under the tongue every 5 (five) minutes x 3 doses as needed for chest pain. 11/14/15  Yes Scott Joylene Draft, PA-C  omeprazole (PRILOSEC) 20 MG capsule Take 20 mg by mouth 2 (two) times daily. 05/03/15 05/02/16 Yes Historical Provider, MD  pravastatin (PRAVACHOL) 10 MG tablet TAKE 1 TABLET (10 MG TOTAL) BY MOUTH EVERY EVENING. 10/13/15  Yes [name], MD  albuterol (PROVENTIL HFA;VENTOLIN HFA) 108 (90 BASE) MCG/ACT inhaler Inhale 2 puffs into the lungs every 4 (four) hours as needed for wheezing or shortness of breath (cough, shortness of breath or wheezing.). Patient not taking: Reported on 11/22/2015 11/01/15   Darlyne Russian, MD  loperamide (IMODIUM) 2 MG capsule Take 1 capsule (2 mg total) by mouth 4 (four) times daily as needed for diarrhea or loose stools. Patient not taking: Reported on 11/22/2015 11/01/15   Kristen N Ward, DO     ROS:  Constitutional: negative for fever, chills, night sweats, weight changes, or fatigue  HEENT: negative for vision changes, hearing loss, congestion, rhinorrhea, ST, epistaxis, or sinus pressure Cardiovascular: negative for chest pain or palpitations Respiratory: negative for hemoptysis, wheezing, shortness of breath, or cough Abdominal: negative for abdominal pain, nausea, vomiting, diarrhea, or constipation Dermatological: negative for  rash Neurologic: negative for headache, dizziness, or syncope All other systems reviewed and are otherwise negative with the exception to those above and in the HPI.  PHYSICAL EXAM: Filed Vitals:   01/17/16 0814  BP: 124/64  Pulse: 79  Temp: 98 F (36.7 C)  Resp: 16   Body mass index is 23.97 kg/(m^2).   General: Alert, no acute distress HEENT:  Normocephalic, atraumatic, oropharynx patent.  Eye: Juliette Mangle [facility] Cardiovascular:  Regular rate and rhythm, no rubs murmurs or gallops.  No Carotid bruits, radial pulse intact. No pedal edema.  Respiratory: Clear to auscultation bilaterally.  No wheezes, rales, or rhonchi.  No cyanosis, no use of accessory musculature Abdominal: No organomegaly, abdomen is soft and non-tender, positive bowel sounds. No masses. Musculoskeletal: Gait intact. No edema, tenderness Skin: No rashes. Neurologic: Facial musculature symmetric. Psychiatric: Patient acts appropriately throughout our interaction.  Lymphatic: No cervical or submandibular lymphadenopathy Genitourinary/Anorectal: No acute findings   LABS:   EKG/XRAY:   Primary read interpreted by Dr. Everlene Farrier at [facility].   ASSESSMENT/PLAN: Patient doing well with colitis. She is [facility] [place]. She has not lost any more weight. Will recheck 1 month. She is released to work full time.   Gross sideeffects, risk and benefits, and alternatives of medications d/w patient. Patient is aware that all medications have potential sideeffects and we are unable to predict every sideeffect or drug-drug interaction that may occur.  [name] MD 01/17/2016 8:30 AM

## 2016-01-18 ENCOUNTER — Telehealth: Payer: Self-pay

## 2016-01-18 NOTE — Telephone Encounter (Signed)
Paperwork was faxed in for FMLA time to be completed by Dr. Everlene Farrier, please fill out forms and return them to the FMLA/Disability box at the [employer] checkout desk with in 5-7 business days. I will place them in your box on 01/18/16.

## 2016-01-20 ENCOUNTER — Telehealth: Payer: Self-pay

## 2016-01-20 NOTE — Telephone Encounter (Signed)
DAUB - PT says she has diarrhea so bad she cant leave her house.  Said she had went to a hospital and they have told her to do different things.  She doesn't know what to do and wants you to call her asap.  [phone]

## 2016-01-23 NOTE — Telephone Encounter (Signed)
She needs to follow the schedule she was doing regarding her diet. When is her appointment with GI. Please let me know when that is and we can call and see if we can get her in sooner.

## 2016-01-23 NOTE — Telephone Encounter (Signed)
Spoke with pt, advised message from Dr. Everlene Farrier. Pt states she is ok and she actually has an appt 4 days earlier than her original. She went to the hospital for severe abdominal cramps and they found out she was constipated. They treated her and she is feeling better.

## 2016-01-23 NOTE — Telephone Encounter (Signed)
Left message for pt to call back  °

## 2016-01-25 ENCOUNTER — Inpatient Hospital Stay: Payer: BLUE CROSS/BLUE SHIELD | Admitting: Emergency Medicine

## 2016-01-29 NOTE — Telephone Encounter (Signed)
Today is the 7th day on these forms, have we gotten them completed yet? Patient called to check the status of them today and stated that HR really needed them back. Please let me know thanks!

## 2016-01-30 NOTE — Telephone Encounter (Signed)
Please check. I do not have these forms in my box. Call Estill Bamberg and ask her to have her company refax them to me.

## 2016-01-30 NOTE — Telephone Encounter (Signed)
Spoke with pt, she will have have HR send the forms again.

## 2016-01-31 NOTE — Telephone Encounter (Signed)
Paperwork was refaxed today I have placed it in your box in a FMLA folder, please fill out and return to the FMLA/Disability box at the [employer] checkout desk within 5-7 business days. Thank you!

## 2016-02-01 NOTE — Telephone Encounter (Signed)
Call patient. Tell her I will work on her FMLA papers Friday morning.

## 2016-02-01 NOTE — Telephone Encounter (Signed)
Ok I will call her today, thank you!!

## 2016-02-12 NOTE — Telephone Encounter (Signed)
Did we complete these forms because I have not seen them? Please let me know thank you!

## 2016-02-12 NOTE — Telephone Encounter (Signed)
I can't remember if we have completed these forms. If we have not asked them to be resubmitted and I will sign them as best I can.

## 2016-02-13 NOTE — Telephone Encounter (Signed)
Dr. Everlene Farrier or medical records have you seen this?

## 2016-02-13 NOTE — Telephone Encounter (Signed)
Please continue to search for the record. I have not seen any recent FMLA forms from her

## 2016-02-13 NOTE — Telephone Encounter (Signed)
No, I will search for it.

## 2016-02-13 NOTE — Telephone Encounter (Signed)
Routing to MR. 

## 2016-02-13 NOTE — Telephone Encounter (Signed)
Spoke with pt, she states all paperwork was sent in. She is looking for a letter from [facility] blood blank. She has antibody K in her blood. They told her if she needs a blood transfusion it could be a delay.

## 2016-02-13 NOTE — Telephone Encounter (Signed)
FMLA done she said it was just a report from the blood bank.

## 2016-02-22 ENCOUNTER — Inpatient Hospital Stay: Payer: BLUE CROSS/BLUE SHIELD | Admitting: Emergency Medicine

## 2016-02-22 ENCOUNTER — Ambulatory Visit: Payer: BLUE CROSS/BLUE SHIELD | Admitting: Emergency Medicine

## 2016-03-02 IMAGING — CR DG SACRUM/COCCYX 2+V
3 series · 3 of 3 positions shown · non-contrast
Comparison: CT ABD/PELVIS W CM dated 09/22/2012

CLINICAL DATA: Fall.  Coccygeal pain.

EXAM:
SACRUM AND COCCYX - 2+ VIEW

[AP]
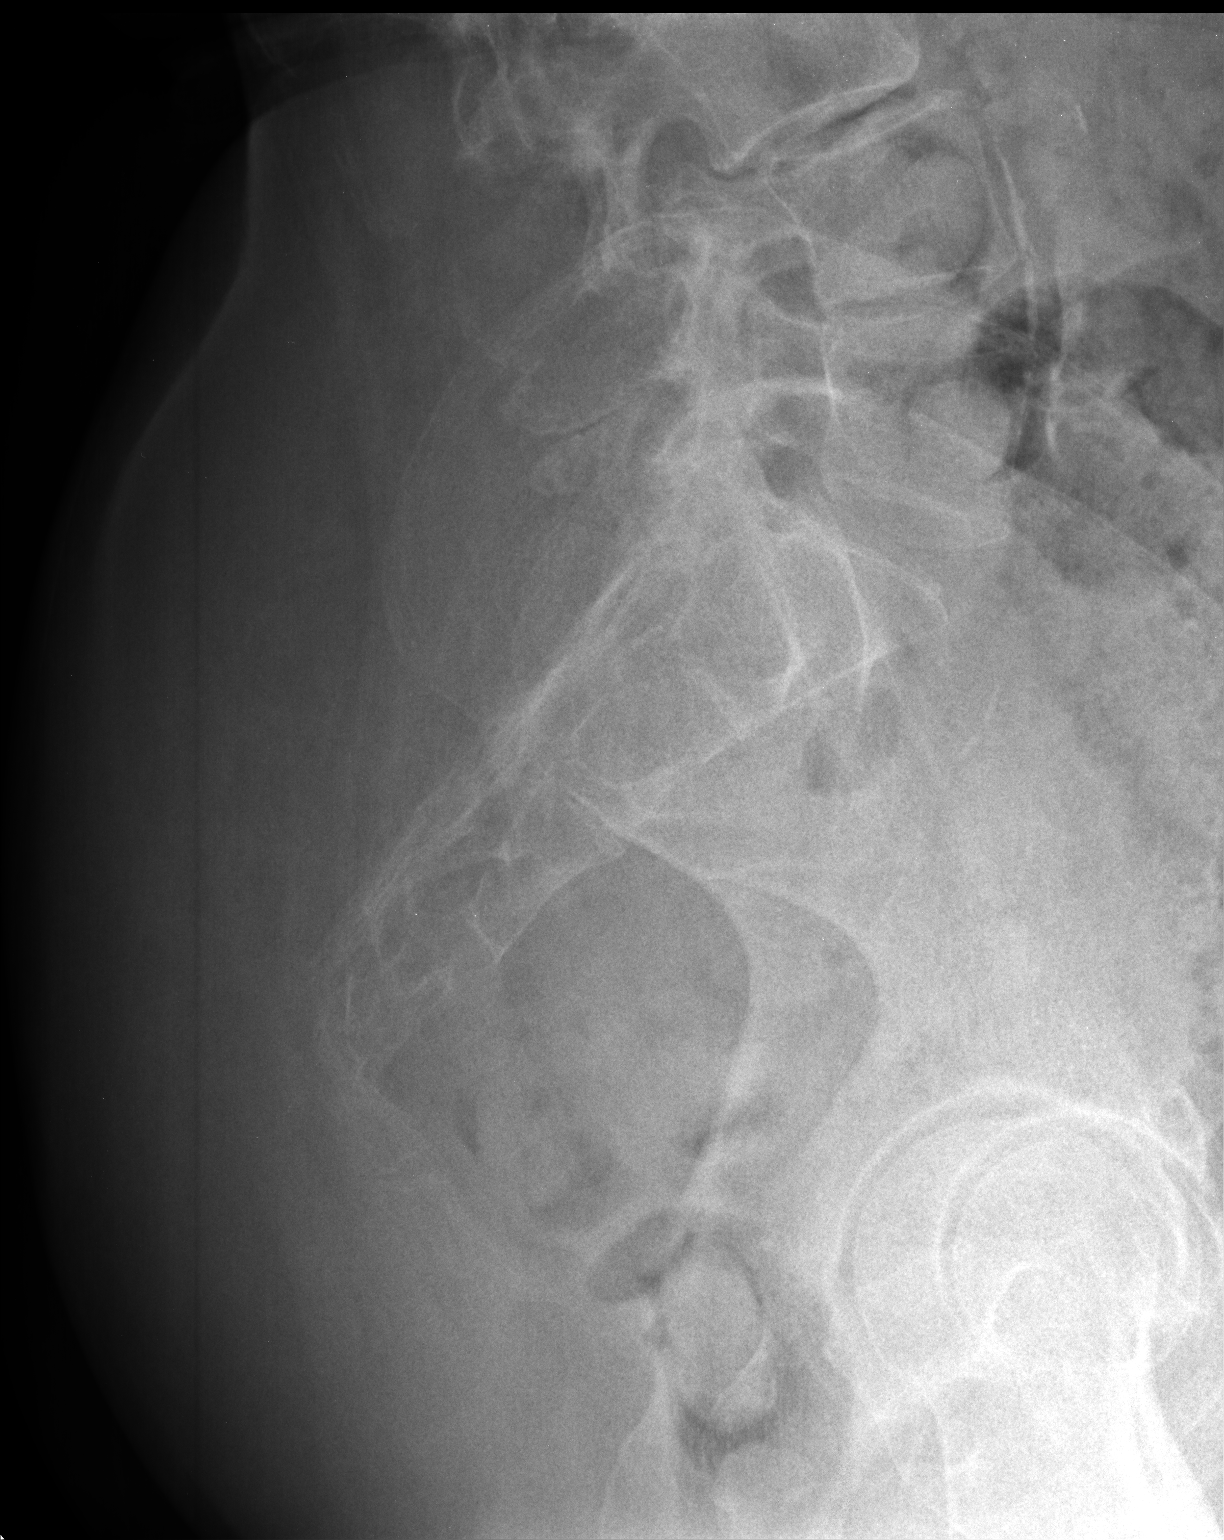

[lateral]
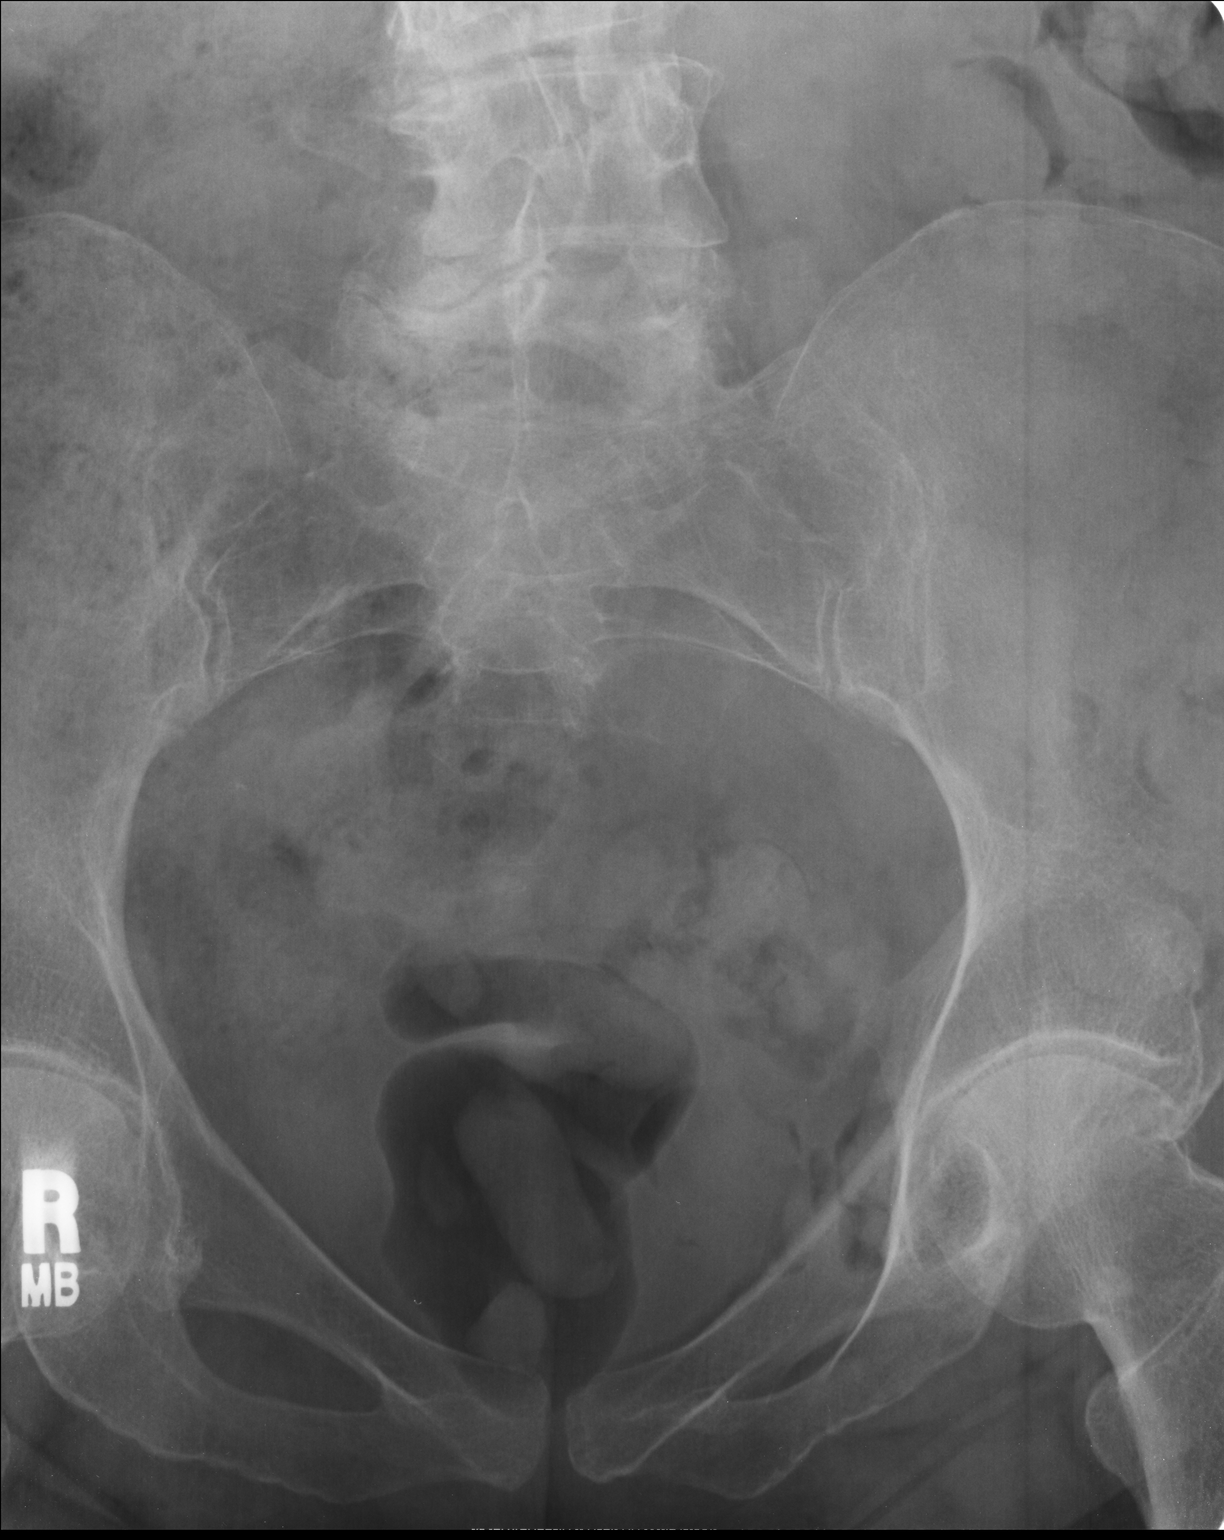

[ap axial]
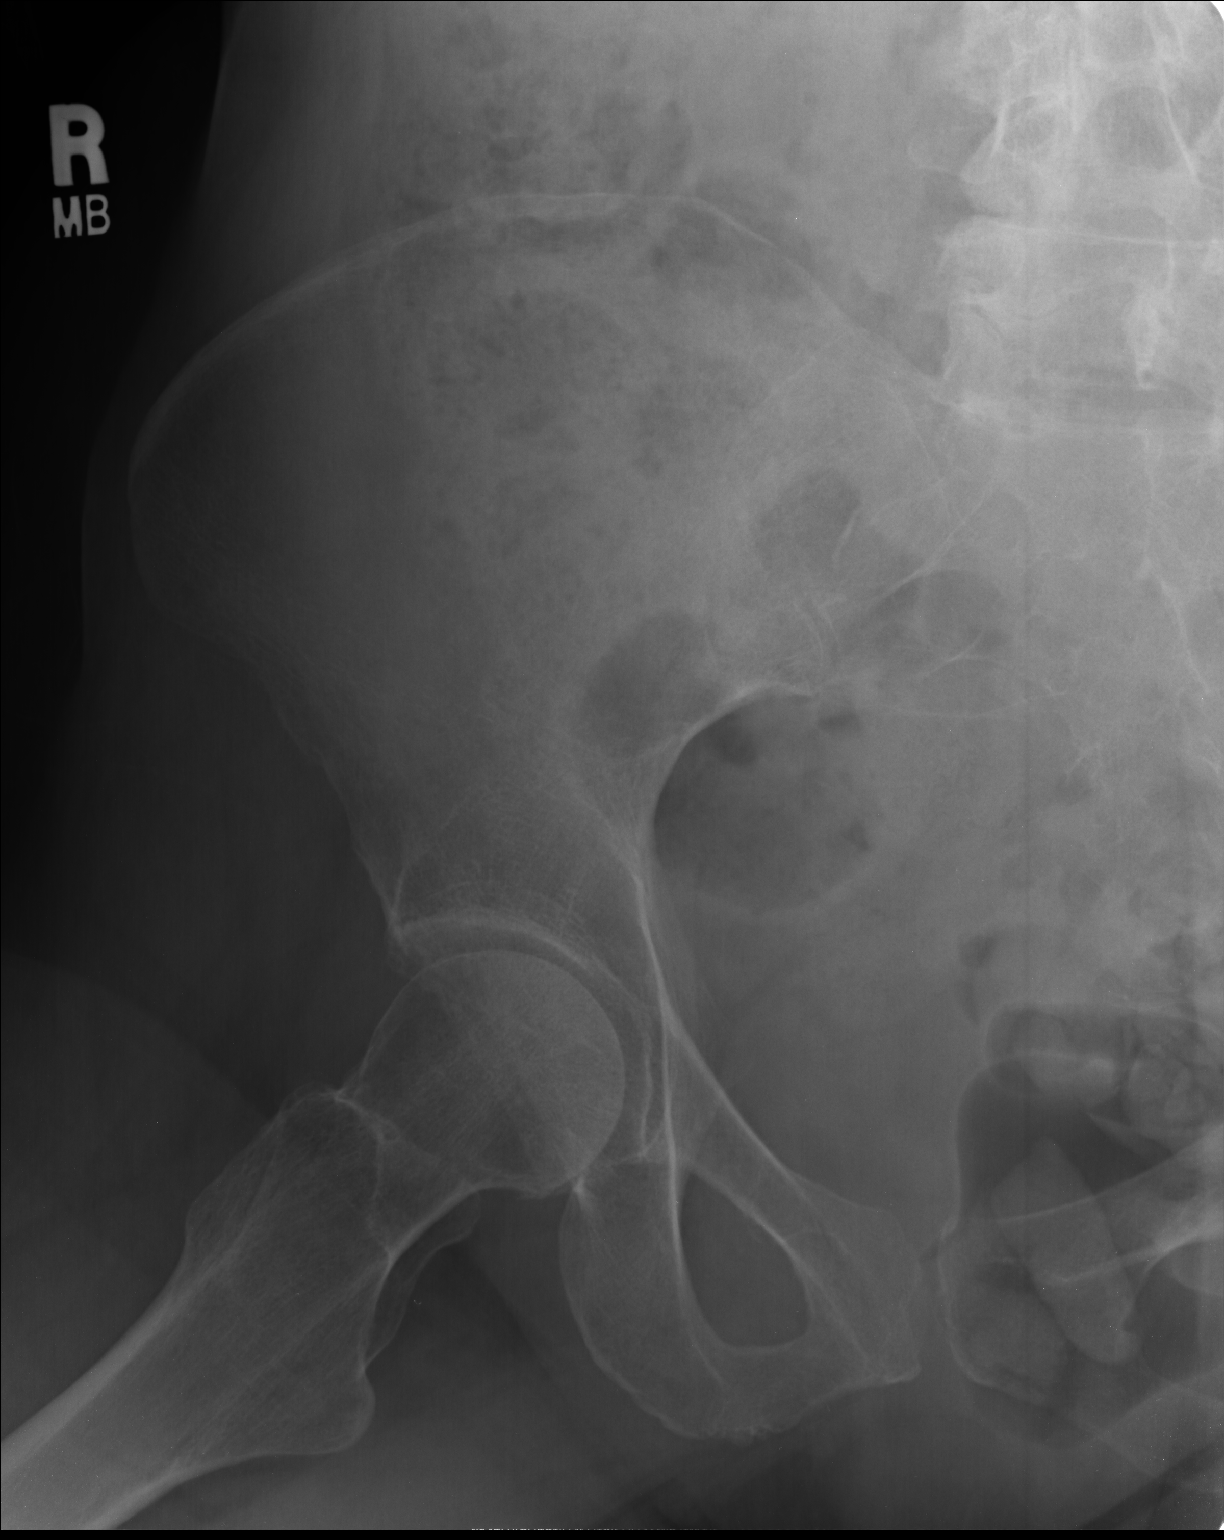

[3 of 3 positions shown; findings below may reference images not displayed]

FINDINGS: There is acute angulation at the sacrococcygeal junction however
this is a chronic finding and is unchanged compared to 09/22/2012.
There is no displaced sacral or coccygeal fracture identified.
Lateral view is underpenetrated.
IMPRESSION: No acute abnormality.

## 2016-03-28 ENCOUNTER — Telehealth: Payer: Self-pay | Admitting: Internal Medicine

## 2016-03-28 NOTE — Telephone Encounter (Signed)
Patient called because when she pushed on her ankles her skin looked "white." She has some swelling as well but it is not pitting. She denies any pain or shortness of breath. She does endorse taking a SL NTG for a chest discomfort earlier in the week. I reinforced importance of calling 911 with chest discomfort that persists. We discussed that being evaluated by her PCP would be a great next step to assess her feet swelling. I reinforced that if she had acute pain or changes in symptoms she should call 911. She will try to get in with her PCP and/or Dr. Burt Knack.  Jules Husbands, MD

## 2016-03-29 ENCOUNTER — Ambulatory Visit (INDEPENDENT_AMBULATORY_CARE_PROVIDER_SITE_OTHER): Payer: BLUE CROSS/BLUE SHIELD | Admitting: Emergency Medicine

## 2016-03-29 ENCOUNTER — Telehealth: Payer: Self-pay | Admitting: *Deleted

## 2016-03-29 ENCOUNTER — Telehealth: Payer: Self-pay | Admitting: Cardiovascular Disease

## 2016-03-29 VITALS — BP 116/70 | HR 73 | Temp 98.4°F | Resp 16 | Ht 61.0 in | Wt 126.0 lb

## 2016-03-29 DIAGNOSIS — R079 Chest pain, unspecified: Secondary | ICD-10-CM | POA: Diagnosis not present

## 2016-03-29 DIAGNOSIS — R600 Localized edema: Secondary | ICD-10-CM | POA: Diagnosis not present

## 2016-03-29 LAB — BASIC METABOLIC PANEL WITH GFR
BUN: 14 mg/dL (ref 7–25)
CHLORIDE: 103 mmol/L (ref 98–110)
CO2: 27 mmol/L (ref 20–31)
CREATININE: 0.75 mg/dL (ref 0.60–0.93)
Calcium: 9.6 mg/dL (ref 8.6–10.4)
GFR, Est African American: >89 mL/min (ref >=60)
GFR, Est Non African American: 79 mL/min (ref >=60)
Glucose, Bld: 99 mg/dL (ref 65–99)
POTASSIUM: 4.1 mmol/L (ref 3.5–5.3)
Sodium: 139 mmol/L (ref 135–146)

## 2016-03-29 LAB — POCT CBC
Granulocyte percent: 65.1 %G (ref 37–80)
HEMATOCRIT: 36.4 % — AB (ref 37.7–47.9)
Hemoglobin: 12.5 g/dL (ref 12.2–16.2)
LYMPH, POC: 2.2 (ref 0.6–3.4)
MCH, POC: 27.3 pg (ref 27–31.2)
MCHC: 34.4 g/dL (ref 31.8–35.4)
MCV: 79.3 fL — AB (ref 80–97)
MID (CBC): 0.5 (ref 0–0.9)
MPV: 7.6 fL (ref 0–99.8)
POC GRANULOCYTE: 5 (ref 2–6.9)
POC LYMPH %: 28.9 % (ref 10–50)
POC MID %: 6 % (ref 0–12)
Platelet Count, POC: 166 10*3/uL (ref 142–424)
RBC: 4.59 M/uL (ref 4.04–5.48)
RDW, POC: 17.4 %
WBC: 7.7 10*3/uL (ref 4.6–10.2)

## 2016-03-29 NOTE — Progress Notes (Signed)
By signing my name below, I, Raven Small, attest that this documentation has been prepared under the direction and in the presence of [name], MD.  Electronically Signed: Thea Alken, ED Scribe. 03/29/2016. 10:28 AM.  Chief Complaint:  Chief Complaint  Patient presents with  . Foot Swelling    both  . Chest Pain    x 6 days ago    HPI: Kristine [name] is a 75 y.o. female who reports to [facility] today complaining of bilateral feet swelling noticed last night. Pt states she had been sitting at her desk all days yesterdays and denies being on her feet. This is a new complaint for her and does not have hx of leg swelling. she does not have leg swelling at this time. She also noted having stabbing CP for a couple seconds 6 days ago. She has an appointment with her cardiologist next week with Dr. [name]. She denies SOB.   Pt likes her new GI physician. She reports her former physician misdiagnosed her with ulcerative colitis but instead has been diagnosed with ischemic colitis by her new physician. She no longer has restriction with her diet. She started back dancing last week.   Past Medical History  Diagnosis Date  . GERD (gastroesophageal reflux disease)   . Barrett's esophagus     Stage B  . Cystocele   . Rectocele     mild  . Hypertension   . Diverticulitis   . Arthritis   . Raynaud disease   . CREST variant of scleroderma ([place])   . Osteopenia 09/2012    T score -2.4 FRAX 13%/2.7%  per 10/15/2012 discussion plan repeat in 2 years  . Anemia   . CAD (coronary artery disease)     a. s/p STEMI 7/14 >> LHC: LM 20, mLAD 30-40, small Dx 100 (L-L collats), OM1 90, OM2 30-40, mRCA 50-60, Lat/AL akinesis, EF 35-40% >> PCI: Promus DES to OM1;  b. Myoview 3/16: diaph atten, no ischemia, EF 59% (intermediate risk b/c BP drop 152>>137, but recovery BP 162 (tech error) >> med Rx continued   . Hyperlipidemia   . Ischemic cardiomyopathy     a. EF 35-40% by LHC at time of MI;  b. Echo 7/14:   Mild focal basal septal hypertrophy, EF 50-55%, apical HK, grade 1 diastolic dysfunction, trivial AI   . History of Doppler ultrasound     a. Carotid US 2/12: bilateral ICA < 50%   Past Surgical History  Procedure Laterality Date  . Tonsillectomy  1950  . Tubal ligation  1980  . Nissen fundoplication  123456  . Total knee arthroplasty      right  . Joint replacement  9/10    rt total  . Foot surgery  2013    x2  . Abdominal hysterectomy  1996    RSO for menorrhagia.  . Coronary angioplasty with stent placement  06/21/2013   Social History   Social History  . Marital Status: Divorced    Spouse Name: N/A  . Number of Children: N/A  . Years of Education: N/A   Social History Main Topics  . Smoking status: Never Smoker   . Smokeless tobacco: Never Used  . Alcohol Use: 0.0 oz/week    0 Standard drinks or equivalent per week     Comment: Occasional  . Drug Use: No  . Sexual Activity: No   Other Topics Concern  . None   Social History Narrative   Exercise dancing  3 x times weekly for 1-2 hours   Family History  Problem Relation Age of Onset  . Cancer Mother     Pancreatic Cancer  . Hepatitis C Mother   . Heart disease Father     congestive heart failure  . Panic disorder Daughter   . Asthma Maternal Grandmother   . Emphysema Maternal Grandmother   . Cancer Maternal Grandfather     lung  . Heart attack Father   . Stroke Neg Hx    Allergies  Allergen Reactions  . Aspirin Other (See Comments)    Pt is prone to bleeding, ok with low dose coated aspirin  . Flagyl [Metronidazole]     headaches  . Nsaids Other (See Comments)    Pt is prone to bleeding  . Pravastatin Sodium     Other reaction(s): Other (See Comments) elevated LFT'S, tolerates 1/2 dose of zetia and 1/2 dose of pravastatin  . Statins Other (See Comments)    elevated LFT'S, tolerates 1/2 dose of zetia and 1/2 dose of pravastatin   Prior to Admission medications   Medication Sig Start Date End Date  Taking? Authorizing Provider  albuterol (PROVENTIL HFA;VENTOLIN HFA) 108 (90 BASE) MCG/ACT inhaler Inhale 2 puffs into the lungs every 4 (four) hours as needed for wheezing or shortness of breath (cough, shortness of breath or wheezing.). 11/01/15  Yes Darlyne Russian, MD  ALPRAZolam Duanne Moron) 0.25 MG tablet Take 1/2-1 tablet as needed for anxiety 12/28/15  Yes Darlyne Russian, MD  aspirin EC 81 MG EC tablet Take 1 tablet (81 mg total) by mouth daily. 06/24/13  Yes Rhonda G Barrett, PA-C  dicyclomine (BENTYL) 20 MG tablet Take 20 mg by mouth every 6 (six) hours.   Yes Historical Provider, MD  ezetimibe (ZETIA) 10 MG tablet Take 0.5 tablets (5 mg total) by mouth daily. 11/16/15  Yes [name], MD  loperamide (IMODIUM) 2 MG capsule Take 1 capsule (2 mg total) by mouth 4 (four) times daily as needed for diarrhea or loose stools. 11/01/15  Yes Kristen N Ward, DO  losartan (COZAAR) 50 MG tablet Take 0.5 tablets (25 mg total) by mouth daily. 10/12/15  Yes Florencia Reasons, MD  Multiple Vitamin (MULTIVITAMIN WITH MINERALS) TABS tablet Take 1 tablet by mouth daily.   Yes Historical Provider, MD  nitroGLYCERIN (NITROSTAT) 0.4 MG SL tablet Place 1 tablet (0.4 mg total) under the tongue every 5 (five) minutes x 3 doses as needed for chest pain. 11/14/15  Yes Scott Joylene Draft, PA-C  omeprazole (PRILOSEC) 20 MG capsule Take 20 mg by mouth 2 (two) times daily. 05/03/15 05/02/16 Yes Historical Provider, MD  pravastatin (PRAVACHOL) 10 MG tablet TAKE 1 TABLET (10 MG TOTAL) BY MOUTH EVERY EVENING. 10/13/15  Yes [name], MD     ROS: The patient denies fevers, chills, night sweats, unintentional weight loss, palpitations, wheezing, dyspnea on exertion, nausea, vomiting, abdominal pain, dysuria, hematuria, melena, numbness, weakness, or tingling.   All other systems have been reviewed and were otherwise negative with the exception of those mentioned in the HPI and as above.    PHYSICAL EXAM: Filed Vitals:   03/29/16 0932  BP:  116/70  Pulse: 73  Temp: 98.4 F (36.9 C)  Resp: 16   Body mass index is 23.82 kg/(m^2).   General: Alert, no acute distress HEENT:  Normocephalic, atraumatic, oropharynx patent. Eye: Juliette Mangle [facility] Cardiovascular:  Regular rate and rhythm, no rubs murmurs or gallops.  No Carotid bruits, radial pulse intact. No  pedal edema.  Respiratory: Clear to auscultation bilaterally.  No wheezes, rales, or rhonchi.  No cyanosis, no use of accessory musculature Abdominal: No organomegaly, abdomen is soft and non-tender, positive bowel sounds.  No masses. Musculoskeletal: Gait intact. No tenderness, she did not have edema. 2+ pulses. Bilateral superficial varicose veins.  Skin: No rashes. Neurologic: Facial musculature symmetric. Psychiatric: Patient acts appropriately throughout our interaction. Lymphatic: No cervical or submandibular lymphadenopathy   LABS: Results for orders placed or performed in visit on 03/29/16  POCT CBC  Result Value Ref Range   WBC 7.7 4.6 - 10.2 K/uL   Lymph, poc 2.2 0.6 - 3.4   POC LYMPH PERCENT 28.9 10 - 50 %L   MID (cbc) 0.5 0 - 0.9   POC MID % 6.0 0 - 12 %M   POC Granulocyte 5.0 2 - 6.9   Granulocyte percent 65.1 37 - 80 %G   RBC 4.59 4.04 - 5.48 M/uL   Hemoglobin 12.5 12.2 - 16.2 g/dL   HCT, POC 36.4 (A) 37.7 - 47.9 %   MCV 79.3 (A) 80 - 97 fL   MCH, POC 27.3 27 - 31.2 pg   MCHC 34.4 31.8 - 35.4 g/dL   RDW, POC 17.4 %   Platelet Count, POC 166 142 - 424 K/uL   MPV 7.6 0 - 99.8 fL  I  EKG/XRAY:   Primary read interpreted by Dr. Everlene Farrier at [facility]. EKG-normal sinus rhythm RSR1 in V1 no change from previous.  ASSESSMENT/PLAN: I suspect her edema was secondary to keeping her legs dependent. Her EKG is totally unchanged. She has a follow-up appointment with her cardiologist next week..I personally performed the services described in this documentation, which was scribed in my presence. The recorded information has been reviewed and is accurate.   Gross  sideeffects, risk and benefits, and alternatives of medications d/w patient. Patient is aware that all medications have potential sideeffects and we are unable to predict every sideeffect or drug-drug interaction that may occur.  [name] MD 03/29/2016 10:28 AM

## 2016-03-29 NOTE — Telephone Encounter (Signed)
New message   Pt is calling for an appt with Dr.Cooper his first available of the day or the last appt of the day   Because pt states that she still works  Pt verbalized that she don't have any issues today she seen her PCP this week and they did a EKG it was fine she just wants to follow up with Dr.Cooper

## 2016-03-29 NOTE — Patient Instructions (Addendum)
When you are sitting please try and keep your legs elevated. Be sure you wear support hose to prevent swelling in your legs.    IF you received an x-ray today, you will receive an invoice from [facility]. Please contact [facility] at [phone] with questions or concerns regarding your invoice.   IF you received labwork today, you will receive an invoice from [employer]. Please contact [facility] at [phone] with questions or concerns regarding your invoice.   Our billing staff will not be able to assist you with questions regarding bills from these companies.  You will be contacted with the lab results as soon as they are available. The fastest way to get your results is to activate your My Chart account. Instructions are located on the last page of this paperwork. If you have not heard from [place] regarding the results in 2 weeks, please contact this office.

## 2016-03-29 NOTE — Telephone Encounter (Signed)
Patient walks into our office this morning asking to been seen and for an EKG.  She spoke with Dr. Jules Husbands, after hours last night, who recommended she be seen by her PCP/Dr. Burt Knack to address swelling and advised her to call 911 for chest discomfort (see his telephone note). States that last night her feet were so swollen that an indention was left when she touched them.  I assessed both feet that were completely normal in appearance with no signs of swelling.  She denies any pain/SOB/chest pain or discomfort. She explains that she comes into our office hoping to be seen, "I cannot afford to go to the emergency room".  Advised patient that we do not see walk ins.  Informed patient that I would send Lauren, RN (Dr. Antionette Char nurse) a message about patient requesting to be seen. Patient understands that Ander Purpura will contact her.

## 2016-04-02 NOTE — Telephone Encounter (Signed)
I spoke with the pt and scheduled follow-up appointment on 04/24/16 with Dr Burt Knack.

## 2016-04-24 ENCOUNTER — Ambulatory Visit (INDEPENDENT_AMBULATORY_CARE_PROVIDER_SITE_OTHER): Payer: BLUE CROSS/BLUE SHIELD | Admitting: Cardiovascular Disease

## 2016-04-24 ENCOUNTER — Encounter: Payer: Self-pay | Admitting: Cardiovascular Disease

## 2016-04-24 VITALS — BP 130/72 | HR 88 | Ht 61.0 in | Wt 127.8 lb

## 2016-04-24 DIAGNOSIS — I25118 Atherosclerotic heart disease of native coronary artery with other forms of angina pectoris: Secondary | ICD-10-CM | POA: Diagnosis not present

## 2016-04-24 NOTE — Progress Notes (Signed)
Cardiology Office Note Date:  04/24/2016   ID:  Constant Kroninger, DOB December 11, 1940, MRN [id number]  PCP:  Jenny Reichmann, MD  Cardiologist:  [name], MD    Chief Complaint  Patient presents with  . Chest Pain    "stabbing". no sob,lee,or claudication   History of Present Illness: Kristine [name] is a 75 y.o. female who presents for followup evaluation. The patient has coronary artery disease and she presented with an acute lateral ST elevation MI 06/21/2013. She underwent PCI to left circumflex with a drug-eluting stent. She had some residual CAD with medical therapy recommended.   Her cardiac catheterization demonstrated occlusion of the small diagonal branch with left to left collaterals. There is 90% stenosis in the first obtuse marginal branch and this was treated with PCI. The right coronary artery had moderate 50-60% stenosis. The LAD was widely patent as was the left main. The patient's left ventricular ejection fraction was initially 35-40%. On followup echocardiography her ejection fraction had improved to 50-55%. Her most recent nuclear scan was one year ago and showed no significant ischemia.   Today, she denies symptoms of palpitations, shortness of breath, orthopnea, PND, lower extremity edema, dizziness, or syncope. However, she has had fleeting episodes of chest pain that she refers to as 'jabs.' These only last a few seconds. Her pain associated with the MI felt like an 'elephant' and a pressure-like sensation in the mid-chest. She has had no recurrence of this. She remains active with no exertional symptoms.   Past Medical History  Diagnosis Date  . GERD (gastroesophageal reflux disease)   . Barrett's esophagus     Stage B  . Cystocele   . Rectocele     mild  . Hypertension   . Diverticulitis   . Arthritis   . Raynaud disease   . CREST variant of scleroderma ([place])   . Osteopenia 09/2012    T score -2.4 FRAX 13%/2.7%  per 10/15/2012 discussion plan repeat in 2 years  .  Anemia   . CAD (coronary artery disease)     a. s/p STEMI 7/14 >> LHC: LM 20, mLAD 30-40, small Dx 100 (L-L collats), OM1 90, OM2 30-40, mRCA 50-60, Lat/AL akinesis, EF 35-40% >> PCI: Promus DES to OM1;  b. Myoview 3/16: diaph atten, no ischemia, EF 59% (intermediate risk b/c BP drop 152>>137, but recovery BP 162 (tech error) >> med Rx continued   . Hyperlipidemia   . Ischemic cardiomyopathy     a. EF 35-40% by LHC at time of MI;  b. Echo 7/14:  Mild focal basal septal hypertrophy, EF 50-55%, apical HK, grade 1 diastolic dysfunction, trivial AI   . History of Doppler ultrasound     a. Carotid US 2/12: bilateral ICA < 50%    Past Surgical History  Procedure Laterality Date  . Tonsillectomy  1950  . Tubal ligation  1980  . Nissen fundoplication  123456  . Total knee arthroplasty      right  . Joint replacement  9/10    rt total  . Foot surgery  2013    x2  . Abdominal hysterectomy  1996    RSO for menorrhagia.  . Coronary angioplasty with stent placement  06/21/2013    Current Outpatient Prescriptions  Medication Sig Dispense Refill  . albuterol (PROVENTIL HFA;VENTOLIN HFA) 108 (90 BASE) MCG/ACT inhaler Inhale 2 puffs into the lungs every 4 (four) hours as needed for wheezing or shortness of breath (cough, shortness of breath or  wheezing.). 1 Inhaler 1  . ALPRAZolam (XANAX) 0.25 MG tablet Take 0.25 mg by mouth daily as needed for anxiety.    Marland Kitchen aspirin EC 81 MG EC tablet Take 1 tablet (81 mg total) by mouth daily.    Marland Kitchen dicyclomine (BENTYL) 20 MG tablet Take 20 mg by mouth every 6 (six) hours.    Marland Kitchen ezetimibe (ZETIA) 10 MG tablet Take 0.5 tablets (5 mg total) by mouth daily. 90 tablet 2  . loperamide (IMODIUM) 2 MG capsule Take 1 capsule (2 mg total) by mouth 4 (four) times daily as needed for diarrhea or loose stools. 15 capsule 0  . losartan (COZAAR) 50 MG tablet Take 0.5 tablets (25 mg total) by mouth daily. 30 tablet 11  . Multiple Vitamin (MULTIVITAMIN WITH MINERALS) TABS tablet Take  1 tablet by mouth daily.    . nitroGLYCERIN (NITROSTAT) 0.4 MG SL tablet Place 1 tablet (0.4 mg total) under the tongue every 5 (five) minutes x 3 doses as needed for chest pain. 25 tablet 12  . omeprazole (PRILOSEC) 20 MG capsule Take 20 mg by mouth 2 (two) times daily.    . pravastatin (PRAVACHOL) 10 MG tablet TAKE 1 TABLET (10 MG TOTAL) BY MOUTH EVERY EVENING. 90 tablet 2   No current facility-administered medications for this visit.   Allergies:   Aspirin; Flagyl; Nsaids; Pravastatin sodium; and Statins   Social History:  The patient  reports that she has never smoked. She has never used smokeless tobacco. She reports that she drinks alcohol. She reports that she does not use illicit drugs.   Family History:  The patient's  family history includes Asthma in her maternal grandmother; Cancer in her maternal grandfather and mother; Emphysema in her maternal grandmother; Heart attack in her father; Heart disease in her father; Hepatitis C in her mother; Panic disorder in her daughter. There is no history of Stroke.   ROS:  Please see the history of present illness.  Otherwise, review of systems is positive for abdominal pain, diarrhea, constipation, nausea, and headaches.  All other systems are reviewed and negative.   PHYSICAL EXAM: VS:  BP 130/72 mmHg  Pulse 88  Ht 5\' 1"  (1.549 m)  Wt 127 lb 12.8 oz (57.97 kg)  BMI 24.16 kg/m2  LMP 11/25/1994 , BMI Body mass index is 24.16 kg/(m^2). GEN: Well nourished, well developed, in no acute distress HEENT: normal Neck: no JVD, no masses. No carotid bruits Cardiac: RRR without murmur or gallop                Respiratory:  clear to auscultation bilaterally, normal work of breathing GI: soft, nontender, nondistended, + BS MS: no deformity or atrophy Ext: no pretibial edema, pedal pulses 2+= bilaterally Skin: warm and dry, no rash Neuro:  Strength and sensation are intact Psych: euthymic mood, full affect  EKG:  EKG is not ordered  today.  Recent Labs: 10/09/2015: TSH 1.580 10/12/2015: Magnesium 1.8 11/01/2015: ALT 35; Platelets 206 03/29/2016: BUN 14; Creat 0.75; Hemoglobin 12.5; Potassium 4.1; Sodium 139   Lipid Panel     Component Value Date/Time   CHOL 127 11/23/2015 0912   CHOL 153 07/29/2014 0926   TRIG 83 11/23/2015 0912   TRIG 112 07/29/2014 0926   HDL 48 11/23/2015 0912   HDL 49 07/29/2014 0926   CHOLHDL 2.6 11/23/2015 0912   VLDL 17 11/23/2015 0912   LDLCALC 62 11/23/2015 0912   LDLCALC 82 07/29/2014 0926     Wt Readings from Last  3 Encounters:  04/24/16 127 lb 12.8 oz (57.97 kg)  03/29/16 126 lb (57.153 kg)  01/17/16 126 lb 12.8 oz (57.516 kg)    Cardiac Studies Reviewed: Myoview Scan 01-31-2015: Impression Exercise Capacity: Good exercise capacity. BP Response: Hypotensive blood pressure response. Clinical Symptoms: fatigue ECG Impression: No significant ST segment change suggestive of ischemia. Comparison with Prior Nuclear Study: No previous nuclear study performed  Overall Impression: Intermediate risk stress nuclear study with a small in size, mild in intesity fixed defect in the inferolateral and anterolateral myocardiam most likely secondary to diaphragmatic attenuation artifact. There is no evidence of ischemia. The patient's BP did drop from 152/79mmHg to 137/75mmHg during the exercise but initial BP in recovery was 162/66mmHg. This BP drop may be tech error since BP in immediate recovery was increased from resting BP prior to starting the test. The patient had fatigue but no EKG changes of ischemia.  LV Ejection Fraction: 59%. LV Wall Motion: NL LV Function; NL Wall Motion  ASSESSMENT AND PLAN: 1.  CAD, native vessel, with atypical chest pain. Fleeting sharp pains do not typical for cardiac etiology. I reviewed her EKG's and don't appreciate any changes. Last year's stress test showed no ischemia and she's not having exertional symptoms. Will continue current Rx.  2.  Essential HTN: controlled  3. Hyperlipidemia: continue statin Rx. Last lipids reviewed and at goal. LDL 62.   Current medicines are reviewed with the patient today.  The patient does not have concerns regarding medicines.  Labs/ tests ordered today include:  No orders of the defined types were placed in this encounter.   Disposition:   FU one year  Signed, [name], MD  04/24/2016 5:17 PM    [address], [place], [place]  [address] Phone: [phone]; Fax: [phone]

## 2016-04-24 NOTE — Patient Instructions (Signed)

## 2016-06-29 ENCOUNTER — Ambulatory Visit (INDEPENDENT_AMBULATORY_CARE_PROVIDER_SITE_OTHER): Payer: BLUE CROSS/BLUE SHIELD

## 2016-06-29 ENCOUNTER — Ambulatory Visit (INDEPENDENT_AMBULATORY_CARE_PROVIDER_SITE_OTHER): Payer: BLUE CROSS/BLUE SHIELD | Admitting: Emergency Medicine

## 2016-06-29 VITALS — BP 140/70 | HR 100 | Temp 98.1°F | Resp 18 | Ht 60.0 in | Wt 130.2 lb

## 2016-06-29 DIAGNOSIS — F419 Anxiety disorder, unspecified: Secondary | ICD-10-CM

## 2016-06-29 DIAGNOSIS — G44039 Episodic paroxysmal hemicrania, not intractable: Secondary | ICD-10-CM

## 2016-06-29 LAB — POCT CBC
GRANULOCYTE PERCENT: 66.7 % (ref 37–80)
HEMATOCRIT: 36.2 % — AB (ref 37.7–47.9)
Hemoglobin: 12.7 g/dL (ref 12.2–16.2)
Lymph, poc: 2.5 (ref 0.6–3.4)
MCH: 28.9 pg (ref 27–31.2)
MCHC: 35 g/dL (ref 31.8–35.4)
MCV: 82.5 fL (ref 80–97)
MID (CBC): 0.2 (ref 0–0.9)
MPV: 7.2 fL (ref 0–99.8)
PLATELET COUNT, POC: 160 10*3/uL (ref 142–424)
POC GRANULOCYTE: 5.5 (ref 2–6.9)
POC LYMPH %: 30.3 % (ref 10–50)
POC MID %: 3 % (ref 0–12)
RBC: 4.39 M/uL (ref 4.04–5.48)
RDW, POC: 15.5 %
WBC: 8.2 10*3/uL (ref 4.6–10.2)

## 2016-06-29 LAB — SEDIMENTATION RATE: Sed Rate: 10 mm/hr (ref 0–30)

## 2016-06-29 MED ORDER — GABAPENTIN 100 MG PO CAPS: 100.0000 mg | ORAL_CAPSULE | Freq: Three times a day (TID) | ORAL | 1 refills | Status: DC

## 2016-06-29 MED ORDER — ALPRAZOLAM 0.25 MG PO TABS: 0.2500 mg | ORAL_TABLET | Freq: Every day | ORAL | 0 refills | Status: DC | PRN

## 2016-06-29 NOTE — Patient Instructions (Addendum)
You can take Tylenol for your headache. Try the Neurontin and see if it helps with your headache. I am scheduling you for a CT of your head. I will call you with results of your other blood work.    IF you received an x-ray today, you will receive an invoice from [facility]. Please contact [facility] at [phone] with questions or concerns regarding your invoice.   IF you received labwork today, you will receive an invoice from [employer]. Please contact [facility] at [phone] with questions or concerns regarding your invoice.   Our billing staff will not be able to assist you with questions regarding bills from these companies.  You will be contacted with the lab results as soon as they are available. The fastest way to get your results is to activate your My Chart account. Instructions are located on the last page of this paperwork. If you have not heard from [place] regarding the results in 2 weeks, please contact this office.

## 2016-06-29 NOTE — Progress Notes (Signed)
By signing my name below, I, Kristine [name], attest that this documentation has been prepared under the direction and in the presence of [name], MD. Electronically Signed: Moises [name], [place]. 06/29/2016 , 3:35 PM .  Patient was seen in room 10 .  Chief Complaint:  Chief Complaint  Patient presents with  . Headache    off & on x 5 weeks-has one currently  . Medication Refill    Xanax    HPI: Kristine [name] is a 75 y.o. female who reports to [facility] today complaining of intermittent headache ongoing for 5 weeks, and is still having one today. Patient states it started when she had toothache over her right upper tooth 5 weeks ago. She went to her dentist and and was informed she grinds her teeth. She received a retainer from the dentist and started to have headaches over her right eye radiating down her right jaw. She woke up with a headache this morning as well.   She recently received new glasses 2 months ago. She goes to [facility]. She was previously seen by Dr. Rosana Hoes in [place].   She enjoys being back to work.   She also requested medication refill of her xanax.   Past Medical History:  Diagnosis Date  . Anemia   . Arthritis   . Barrett's esophagus    Stage B  . CAD (coronary artery disease)    a. s/p STEMI 7/14 >> LHC: LM 20, mLAD 30-40, small Dx 100 (L-L collats), OM1 90, OM2 30-40, mRCA 50-60, Lat/AL akinesis, EF 35-40% >> PCI: Promus DES to OM1;  b. Myoview 3/16: diaph atten, no ischemia, EF 59% (intermediate risk b/c BP drop 152>>137, but recovery BP 162 (tech error) >> med Rx continued   . CREST variant of scleroderma ([place])   . Cystocele   . Diverticulitis   . GERD (gastroesophageal reflux disease)   . History of Doppler ultrasound    a. Carotid US 2/12: bilateral ICA < 50%  . Hyperlipidemia   . Hypertension   . Ischemic cardiomyopathy    a. EF 35-40% by LHC at time of MI;  b. Echo 7/14:  Mild focal basal septal hypertrophy, EF 50-55%, apical HK, grade  1 diastolic dysfunction, trivial AI   . Osteopenia 09/2012   T score -2.4 FRAX 13%/2.7%  per 10/15/2012 discussion plan repeat in 2 years  . Raynaud disease   . Rectocele    mild   Past Surgical History:  Procedure Laterality Date  . ABDOMINAL HYSTERECTOMY  1996   RSO for menorrhagia.  . CORONARY ANGIOPLASTY WITH STENT PLACEMENT  06/21/2013  . FOOT SURGERY  2013   x2  . JOINT REPLACEMENT  9/10   rt total  . nissen fundoplication  123456  . TONSILLECTOMY  1950  . TOTAL KNEE ARTHROPLASTY     right  . TUBAL LIGATION  1980   Social History   Social History  . Marital status: Divorced    Spouse name: N/A  . Number of children: N/A  . Years of education: N/A   Social History Main Topics  . Smoking status: Never Smoker  . Smokeless tobacco: Never Used  . Alcohol use 0.0 oz/week     Comment: Occasional  . Drug use: No  . Sexual activity: No   Other Topics Concern  . Not on file   Social History Narrative   Exercise dancing 3 x times weekly for 1-2 hours   Family History  Problem Relation  Age of Onset  . Cancer Mother     Pancreatic Cancer  . Hepatitis C Mother   . Heart disease Father     congestive heart failure  . Panic disorder Daughter   . Asthma Maternal Grandmother   . Emphysema Maternal Grandmother   . Cancer Maternal Grandfather     lung  . Heart attack Father   . Stroke Neg Hx    Allergies  Allergen Reactions  . Aspirin Other (See Comments)    Pt is prone to bleeding, ok with low dose coated aspirin  . Flagyl [Metronidazole]     headaches  . Nsaids Other (See Comments)    Pt is prone to bleeding  . Pravastatin Sodium     Other reaction(s): Other (See Comments) elevated LFT'S, tolerates 1/2 dose of zetia and 1/2 dose of pravastatin  . Statins Other (See Comments)    elevated LFT'S, tolerates 1/2 dose of zetia and 1/2 dose of pravastatin   Prior to Admission medications   Medication Sig Start Date End Date Taking? Authorizing Provider  ALPRAZolam  Duanne Moron) 0.25 MG tablet Take 0.25 mg by mouth daily as needed for anxiety.   Yes Historical Provider, MD  aspirin EC 81 MG EC tablet Take 1 tablet (81 mg total) by mouth daily. 06/24/13  Yes Rhonda G Barrett, PA-C  ezetimibe (ZETIA) 10 MG tablet Take 0.5 tablets (5 mg total) by mouth daily. 11/16/15  Yes [name], MD  losartan (COZAAR) 50 MG tablet Take 0.5 tablets (25 mg total) by mouth daily. 10/12/15  Yes Florencia Reasons, MD  Multiple Vitamin (MULTIVITAMIN WITH MINERALS) TABS tablet Take 1 tablet by mouth daily.   Yes Historical Provider, MD  nitroGLYCERIN (NITROSTAT) 0.4 MG SL tablet Place 1 tablet (0.4 mg total) under the tongue every 5 (five) minutes x 3 doses as needed for chest pain. 11/14/15  Yes Liliane Shi, PA-C  pravastatin (PRAVACHOL) 10 MG tablet TAKE 1 TABLET (10 MG TOTAL) BY MOUTH EVERY EVENING. 10/13/15  Yes [name], MD  albuterol (PROVENTIL HFA;VENTOLIN HFA) 108 (90 BASE) MCG/ACT inhaler Inhale 2 puffs into the lungs every 4 (four) hours as needed for wheezing or shortness of breath (cough, shortness of breath or wheezing.). Patient not taking: Reported on 06/29/2016 11/01/15   Darlyne Russian, MD     ROS:  Constitutional: negative for fever, chills, night sweats, weight changes, or fatigue  HEENT: negative for vision changes, hearing loss, congestion, rhinorrhea, ST, epistaxis, or sinus pressure Cardiovascular: negative for chest pain or palpitations Respiratory: negative for hemoptysis, wheezing, shortness of breath, or cough Abdominal: negative for abdominal pain, nausea, vomiting, diarrhea, or constipation Dermatological: negative for rash Neurologic: negative for dizziness, or syncope; positive for headache All other systems reviewed and are otherwise negative with the exception to those above and in the HPI.  PHYSICAL EXAM: Vitals:   06/29/16 1512  BP: 140/70  Pulse: 100  Resp: 18  Temp: 98.1 F (36.7 C)   Body mass index is 25.44 kg/m.   General: Alert, no  acute distress HEENT:  Normocephalic, atraumatic, oropharynx patent. Eye: EOMI, PEERLDC, tenderness around right orbit and temporal area Cardiovascular:  Regular rate and rhythm, no rubs murmurs or gallops.  No Carotid bruits, radial pulse intact. No pedal edema.  Respiratory: Clear to auscultation bilaterally.  No wheezes, rales, or rhonchi.  No cyanosis, no use of accessory musculature Abdominal: No organomegaly, abdomen is soft and non-tender, positive bowel sounds. No masses. Musculoskeletal: Gait intact. No edema, tenderness  Skin: No rashes. Neurologic: Facial musculature symmetric. Psychiatric: Patient acts appropriately throughout our interaction.  Lymphatic: No cervical or submandibular lymphadenopathy Genitourinary/Anorectal: No acute findings  LABS: Results for orders placed or performed in visit on 06/29/16  POCT CBC  Result Value Ref Range   WBC 8.2 4.6 - 10.2 K/uL   Lymph, poc 2.5 0.6 - 3.4   POC LYMPH PERCENT 30.3 10 - 50 %L   MID (cbc) 0.2 0 - 0.9   POC MID % 3.0 0 - 12 %M   POC Granulocyte 5.5 2 - 6.9   Granulocyte percent 66.7 37 - 80 %G   RBC 4.39 4.04 - 5.48 M/uL   Hemoglobin 12.7 12.2 - 16.2 g/dL   HCT, POC 36.2 (A) 37.7 - 47.9 %   MCV 82.5 80 - 97 fL   MCH, POC 28.9 27 - 31.2 pg   MCHC 35.0 31.8 - 35.4 g/dL   RDW, POC 15.5 %   Platelet Count, POC 160 142 - 424 K/uL   MPV 7.2 0 - 99.8 fL     EKG/XRAY: . Dg Sinus 1-2 Views  Result Date: 06/29/2016 CLINICAL DATA:  75 year old female with facial pain. EXAM: PARANASAL SINUSES - 1-2 VIEW COMPARISON:  No priors. FINDINGS: The paranasal sinus are aerated. There is no evidence of sinus opacification air-fluid levels or mucosal thickening. No significant bone abnormalities are seen. IMPRESSION: Negative. Electronically Signed   By: [name] M.D.   On: 06/29/2016 16:19    ASSESSMENT/PLAN: Patient has had 5 weeks of right periorbital headache. We'll schedule CT of the head along with await the results of her  sedimentation rate. Will try Neurontin 100 mg.tid. I personally performed the services described in this documentation, which was scribed in my presence. The recorded information has been reviewed and is accurate.Referral made to Dr. Katy Fitch for his opinion.  Gross sideeffects, risk and benefits, and alternatives of medications d/w patient. Patient is aware that all medications have potential sideeffects and we are unable to predict every sideeffect or drug-drug interaction that may occur.  [name] MD 06/29/2016 3:17 PM

## 2016-07-04 ENCOUNTER — Ambulatory Visit
Admission: RE | Admit: 2016-07-04 | Discharge: 2016-07-04 | Disposition: A | Discharge status: Home / Self Care | Payer: BLUE CROSS/BLUE SHIELD | Source: Ambulatory Visit | Attending: Emergency Medicine | Admitting: Emergency Medicine

## 2016-08-08 ENCOUNTER — Ambulatory Visit (INDEPENDENT_AMBULATORY_CARE_PROVIDER_SITE_OTHER): Payer: BLUE CROSS/BLUE SHIELD | Admitting: Physician Assistant

## 2016-08-08 ENCOUNTER — Other Ambulatory Visit: Payer: Self-pay

## 2016-08-08 VITALS — BP 128/82 | HR 74 | Temp 97.9°F | Resp 17 | Ht 60.0 in | Wt 133.0 lb

## 2016-08-08 DIAGNOSIS — J069 Acute upper respiratory infection, unspecified: Secondary | ICD-10-CM

## 2016-08-08 DIAGNOSIS — R070 Pain in throat: Secondary | ICD-10-CM

## 2016-08-08 LAB — POCT RAPID STREP A (OFFICE): Rapid Strep A Screen: NEGATIVE

## 2016-08-08 MED ORDER — AMOXICILLIN 500 MG PO CAPS: 500.0000 mg | ORAL_CAPSULE | Freq: Two times a day (BID) | ORAL | 0 refills | Status: AC

## 2016-08-08 MED ORDER — PRAVASTATIN SODIUM 10 MG PO TABS: ORAL_TABLET | ORAL | 2 refills | Status: DC

## 2016-08-08 NOTE — Patient Instructions (Addendum)
You can do the mucinex twice per day.  Cepacol lozenges for throat pain.  And tylenol for pain.  Take the antibiotic as prescribed to completion.   You can continue warm salt water gargles  Upper Respiratory Infection, Adult Most upper respiratory infections (URIs) are a viral infection of the air passages leading to the lungs. A URI affects the nose, throat, and upper air passages. The most common type of URI is nasopharyngitis and is typically referred to as "the common cold." URIs run their course and usually go away on their own. Most of the time, a URI does not require medical attention, but sometimes a bacterial infection in the upper airways can follow a viral infection. This is called a secondary infection. Sinus and middle ear infections are common types of secondary upper respiratory infections. Bacterial pneumonia can also complicate a URI. A URI can worsen asthma and chronic obstructive pulmonary disease (COPD). Sometimes, these complications can require emergency medical care and may be life threatening.  CAUSES Almost all URIs are caused by viruses. A virus is a type of germ and can spread from one person to another.  RISKS FACTORS You may be at risk for a URI if:   You smoke.   You have chronic heart or lung disease.  You have a weakened defense (immune) system.   You are very young or very old.   You have nasal allergies or asthma.  You work in crowded or poorly ventilated areas.  You work in health care facilities or schools. SIGNS AND SYMPTOMS  Symptoms typically develop 2-3 days after you come in contact with a cold virus. Most viral URIs last 7-10 days. However, viral URIs from the influenza virus (flu virus) can last 14-18 days and are typically more severe. Symptoms may include:   Runny or stuffy (congested) nose.   Sneezing.   Cough.   Sore throat.   Headache.   Fatigue.   Fever.   Loss of appetite.   Pain in your forehead, behind your  eyes, and over your cheekbones (sinus pain).  Muscle aches.  DIAGNOSIS  Your health care provider may diagnose a URI by:  Physical exam.  Tests to check that your symptoms are not due to another condition such as:  Strep throat.  Sinusitis.  Pneumonia.  Asthma. TREATMENT  A URI goes away on its own with time. It cannot be cured with medicines, but medicines may be prescribed or recommended to relieve symptoms. Medicines may help:  Reduce your fever.  Reduce your cough.  Relieve nasal congestion. HOME CARE INSTRUCTIONS   Take medicines only as directed by your health care provider.   Gargle warm saltwater or take cough drops to comfort your throat as directed by your health care provider.  Use a warm mist humidifier or inhale steam from a shower to increase air moisture. This may make it easier to breathe.  Drink enough fluid to keep your urine clear or pale yellow.   Eat soups and other clear broths and maintain good nutrition.   Rest as needed.   Return to work when your temperature has returned to normal or as your health care provider advises. You may need to stay home longer to avoid infecting others. You can also use a face mask and careful hand washing to prevent spread of the virus.  Increase the usage of your inhaler if you have asthma.   Do not use any tobacco products, including cigarettes, chewing tobacco, or electronic cigarettes. If  you need help quitting, ask your health care provider. PREVENTION  The best way to protect yourself from getting a cold is to practice good hygiene.   Avoid oral or hand contact with people with cold symptoms.   Wash your hands often if contact occurs.  There is no clear evidence that vitamin C, vitamin E, echinacea, or exercise reduces the chance of developing a cold. However, it is always recommended to get plenty of rest, exercise, and practice good nutrition.  SEEK MEDICAL CARE IF:   You are getting worse  rather than better.   Your symptoms are not controlled by medicine.   You have chills.  You have worsening shortness of breath.  You have brown or red mucus.  You have yellow or brown nasal discharge.  You have pain in your face, especially when you bend forward.  You have a fever.  You have swollen neck glands.  You have pain while swallowing.  You have white areas in the back of your throat. SEEK IMMEDIATE MEDICAL CARE IF:   You have severe or persistent:  Headache.  Ear pain.  Sinus pain.  Chest pain.  You have chronic lung disease and any of the following:  Wheezing.  Prolonged cough.  Coughing up blood.  A change in your usual mucus.  You have a stiff neck.  You have changes in your:  Vision.  Hearing.  Thinking.  Mood. MAKE SURE YOU:   Understand these instructions.  Will watch your condition.  Will get help right away if you are not doing well or get worse.   This information is not intended to replace advice given to you by your health care provider. Make sure you discuss any questions you have with your health care provider.   Document Released: 05/07/2001 Document Revised: 03/28/2015 Document Reviewed: 02/16/2014 Elsevier Interactive Patient Education 2016 [employer].     IF you received an x-ray today, you will receive an invoice from [facility]. Please contact [facility] at [phone] with questions or concerns regarding your invoice.   IF you received labwork today, you will receive an invoice from [employer]. Please contact [facility] at [phone] with questions or concerns regarding your invoice.   Our billing staff will not be able to assist you with questions regarding bills from these companies.  You will be contacted with the lab results as soon as they are available. The fastest way to get your results is to activate your My Chart account. Instructions are located on  the last page of this paperwork. If you have not heard from [place] regarding the results in 2 weeks, please contact this office.

## 2016-08-08 NOTE — Progress Notes (Signed)
[facility] [address] [place] [address] [phone]  By signing my name below, I, Mesha Guinyard, attest that this documentation has been prepared under the direction and in the presence of Ivar Drape, [place]  Electronically Signed: Verlee Monte, Medical Scribe. 08/08/16. 10:43 AM.  Date:  08/08/2016   Name:  Kristine [name]   DOB:  1941/03/03   MRN:  [id number]  PCP:  Jenny Reichmann, MD   Chief Complaint  Patient presents with   Sore Throat    x3days and chills x2days    History of Present Illness:  Kristine [name] is a 75 y.o. female patient who presents to [facility] complaining of sore throat onset 3 days. Pt reports associated symptoms of chills onset 2 days, nasal congestion, and pain with swallowing. Pt came in contact with her daughter 3 days ago who has strep throat, and a co-worker who has a hacking cough. Pt denies coughing, ear pain, and fever.  Pt has no abx allergies.  Patient Active Problem List   Diagnosis Date Noted   Acute blood loss anemia 10/10/2015   Colitis 10/08/2015   CAD (coronary artery disease), native coronary artery 06/27/2013   Hyperlipidemia 06/27/2013   Precordial pain 06/26/2013   ST elevation myocardial infarction (STEMI) of anterolateral wall, initial episode of care ([place]) 06/24/2013   Hypertension    Diverticulitis    GERD (gastroesophageal reflux disease)    Barrett's esophagus    Cystocele    Rectocele     Past Medical History:  Diagnosis Date   Anemia    Arthritis    Barrett's esophagus    Stage B   CAD (coronary artery disease)    a. s/p STEMI 7/14 >> LHC: LM 20, mLAD 30-40, small Dx 100 (L-L collats), OM1 90, OM2 30-40, mRCA 50-60, Lat/AL akinesis, EF 35-40% >> PCI: Promus DES to OM1;  b. Myoview 3/16: diaph atten, no ischemia, EF 59% (intermediate risk b/c BP drop 152>>137, but recovery BP 162 (tech error) >> med Rx continued    CREST variant of scleroderma ([facility])    Cystocele     Diverticulitis    GERD (gastroesophageal reflux disease)    History of Doppler ultrasound    a. Carotid US 2/12: bilateral ICA < 50%   Hyperlipidemia    Hypertension    Ischemic cardiomyopathy    a. EF 35-40% by LHC at time of MI;  b. Echo 7/14:  Mild focal basal septal hypertrophy, EF 50-55%, apical HK, grade 1 diastolic dysfunction, trivial AI    Osteopenia 09/2012   T score -2.4 FRAX 13%/2.7%  per 10/15/2012 discussion [name] repeat in 2 years   Raynaud disease    Rectocele    mild    Past Surgical History:  Procedure Laterality Date   [place] for menorrhagia.   CORONARY ANGIOPLASTY WITH STENT PLACEMENT  06/21/2013   FOOT SURGERY  2013   x2   JOINT REPLACEMENT  9/10   rt total   nissen fundoplication  123456   TONSILLECTOMY  1950   TOTAL KNEE ARTHROPLASTY     right   TUBAL LIGATION  1980    Social History  Substance Use Topics   Smoking status: Never Smoker   Smokeless tobacco: Never Used   Alcohol use 0.0 oz/week     Comment: Occasional    Family History  Problem Relation Age of Onset   Cancer Mother     Pancreatic Cancer  Hepatitis C Mother    Heart disease Father     congestive heart failure   Panic disorder Daughter    Asthma Maternal Grandmother    Emphysema Maternal Grandmother    Cancer Maternal Grandfather     lung   Heart attack Father    Stroke Neg Hx     Allergies  Allergen Reactions   Aspirin Other (See Comments)    Pt is prone to bleeding, ok with low dose coated aspirin   Flagyl [Metronidazole]     headaches   Nsaids Other (See Comments)    Pt is prone to bleeding   Pravastatin Sodium     Other reaction(s): Other (See Comments) elevated LFT'S, tolerates 1/2 dose of zetia and 1/2 dose of pravastatin   Statins Other (See Comments)    elevated LFT'S, tolerates 1/2 dose of zetia and 1/2 dose of pravastatin    Medication list has been reviewed and updated.  Current Outpatient  Prescriptions on File Prior to Visit  Medication Sig Dispense Refill   ALPRAZolam (XANAX) 0.25 MG tablet Take 1 tablet (0.25 mg total) by mouth daily as needed for anxiety. 30 tablet 0   aspirin EC 81 MG EC tablet Take 1 tablet (81 mg total) by mouth daily.     ezetimibe (ZETIA) 10 MG tablet Take 0.5 tablets (5 mg total) by mouth daily. 90 tablet 2   losartan (COZAAR) 50 MG tablet Take 0.5 tablets (25 mg total) by mouth daily. 30 tablet 11   Multiple Vitamin (MULTIVITAMIN WITH MINERALS) TABS tablet Take 1 tablet by mouth daily.     nitroGLYCERIN (NITROSTAT) 0.4 MG SL tablet Place 1 tablet (0.4 mg total) under the tongue every 5 (five) minutes x 3 doses as needed for chest pain. 25 tablet 12   pravastatin (PRAVACHOL) 10 MG tablet TAKE 1 TABLET (10 MG TOTAL) BY MOUTH EVERY EVENING. 90 tablet 2   No current facility-administered medications on file prior to visit.    Review of Systems  Constitutional: Positive for chills. Negative for fever.  HENT: Positive for congestion and sore throat. Negative for ear pain.   Respiratory: Negative for cough.    Physical Examination: BP 128/82 (BP Location: Right Arm, Patient Position: Sitting, Cuff Size: Normal)    Pulse 74    Temp 97.9 F (36.6 C) (Oral)    Resp 17    Ht 5' (1.524 m)    Wt 133 lb (60.3 kg)    LMP 11/25/1994    SpO2 97%    BMI 25.97 kg/m  Ideal Body Weight: @FLOWAMB [id number]  Physical Exam  Constitutional: She is oriented to person, place, and time. She appears well-developed and well-nourished. No distress.  HENT:  Head: Normocephalic and atraumatic.  Right Ear: Tympanic membrane, external ear and ear canal normal.  Left Ear: Tympanic membrane, external ear and ear canal normal.  Nose: Mucosal edema and rhinorrhea present. Right sinus exhibits no maxillary sinus tenderness and no frontal sinus tenderness. Left sinus exhibits no maxillary sinus tenderness and no frontal sinus tenderness.  Mouth/Throat: No uvula swelling.  Posterior oropharyngeal erythema (mild) present. No oropharyngeal exudate or posterior oropharyngeal edema.  Nares nl  Eyes: Conjunctivae and EOM are normal. Pupils are equal, round, and reactive to light.  Neck: No thyromegaly present.  Cardiovascular: Normal rate, regular rhythm and normal heart sounds.  Exam reveals no gallop, no distant heart sounds and no friction rub.   No murmur heard. Pulmonary/Chest: Effort normal. No respiratory distress. She has no decreased  breath sounds. She has no wheezes. She has no rhonchi. She has no rales.  Lymphadenopathy:       Head (right side): No submandibular, no tonsillar, no preauricular and no posterior auricular adenopathy present.       Head (left side): No submandibular, no tonsillar, no preauricular and no posterior auricular adenopathy present.    She has no cervical adenopathy.  Neurological: She is alert and oriented to person, place, and time.  Skin: She is not diaphoretic.  Psychiatric: She has a normal mood and affect. Her behavior is normal.   Results for orders placed or performed in visit on 08/08/16  POCT rapid strep A  Result Value Ref Range   Rapid Strep A Screen Negative Negative   Assessment and [name]: Kristine [name] is a 75 y.o. female who is here today for throat pain.   StrepWas negative. However due to recent contact of strep throat, we will treat today. Advised tylenol for pain and fever. Also recommended warm salt water gargles, and cepacol lozenges. Return to clinic as needed. Also advised mucinex use. Acute upper respiratory infection - [name]: amoxicillin (AMOXIL) 500 MG capsule  Throat pain - [name]: POCT rapid strep A, amoxicillin (AMOXIL) 500 MG capsule, Culture, Group A Strep  Ivar Drape, PA-C [facility] 9/16/20176:31 PM  I personally performed the services described in this documentation, which was scribed in my presence. The recorded information has been reviewed  and is accurate.

## 2016-08-10 LAB — CULTURE, GROUP A STREP: Organism ID, Bacteria: NORMAL

## 2016-08-15 ENCOUNTER — Other Ambulatory Visit: Payer: Self-pay

## 2016-08-15 MED ORDER — ALPRAZOLAM 0.25 MG PO TABS: 0.2500 mg | ORAL_TABLET | Freq: Every day | ORAL | 0 refills | Status: DC | PRN

## 2016-08-15 NOTE — Telephone Encounter (Signed)
Pharm request xanax refill.

## 2016-08-16 NOTE — Telephone Encounter (Signed)
Faxed

## 2016-10-02 LAB — HM MAMMOGRAPHY

## 2016-11-08 DIAGNOSIS — L814 Other melanin hyperpigmentation: Secondary | ICD-10-CM | POA: Insufficient documentation

## 2016-11-19 ENCOUNTER — Telehealth: Payer: Self-pay | Admitting: Cardiovascular Disease

## 2016-11-19 DIAGNOSIS — I10 Essential (primary) hypertension: Secondary | ICD-10-CM

## 2016-11-19 MED ORDER — LOSARTAN POTASSIUM 50 MG PO TABS: 25.0000 mg | ORAL_TABLET | Freq: Every day | ORAL | 0 refills | Status: DC

## 2016-11-19 NOTE — Telephone Encounter (Signed)
New message     *STAT* If patient is at the pharmacy, call can be transferred to refill team.   1. Which medications need to be refilled? (please list name of each medication and dose if known) losartan (COZAAR) 50 MG tablet  2. Which pharmacy/location (including street and city if local pharmacy) is medication to be sent to? [facility] on [address] [place]  [phone]   3. Do they need a 30 day or 90 day supply? 90 day supply

## 2016-11-29 DIAGNOSIS — K3184 Gastroparesis: Secondary | ICD-10-CM | POA: Diagnosis not present

## 2016-11-30 DIAGNOSIS — R5383 Other fatigue: Secondary | ICD-10-CM | POA: Diagnosis not present

## 2016-11-30 DIAGNOSIS — J101 Influenza due to other identified influenza virus with other respiratory manifestations: Secondary | ICD-10-CM | POA: Diagnosis not present

## 2016-11-30 DIAGNOSIS — R6889 Other general symptoms and signs: Secondary | ICD-10-CM | POA: Diagnosis not present

## 2016-12-08 ENCOUNTER — Other Ambulatory Visit: Payer: Self-pay | Admitting: Cardiovascular Disease

## 2016-12-22 DIAGNOSIS — G4733 Obstructive sleep apnea (adult) (pediatric): Secondary | ICD-10-CM | POA: Diagnosis not present

## 2017-01-10 DIAGNOSIS — J101 Influenza due to other identified influenza virus with other respiratory manifestations: Secondary | ICD-10-CM | POA: Diagnosis not present

## 2017-01-10 DIAGNOSIS — R6883 Chills (without fever): Secondary | ICD-10-CM | POA: Diagnosis not present

## 2017-01-18 DIAGNOSIS — R05 Cough: Secondary | ICD-10-CM | POA: Diagnosis not present

## 2017-01-28 DIAGNOSIS — G4733 Obstructive sleep apnea (adult) (pediatric): Secondary | ICD-10-CM | POA: Diagnosis not present

## 2017-02-22 ENCOUNTER — Other Ambulatory Visit: Payer: Self-pay | Admitting: Cardiovascular Disease

## 2017-02-22 DIAGNOSIS — I10 Essential (primary) hypertension: Secondary | ICD-10-CM

## 2017-03-22 ENCOUNTER — Other Ambulatory Visit: Payer: Self-pay | Admitting: Cardiovascular Disease

## 2017-03-22 DIAGNOSIS — I10 Essential (primary) hypertension: Secondary | ICD-10-CM

## 2017-03-24 ENCOUNTER — Other Ambulatory Visit: Payer: Self-pay | Admitting: Cardiovascular Disease

## 2017-03-24 DIAGNOSIS — I10 Essential (primary) hypertension: Secondary | ICD-10-CM

## 2017-03-24 MED ORDER — LOSARTAN POTASSIUM 50 MG PO TABS: 25.0000 mg | ORAL_TABLET | Freq: Every day | ORAL | 1 refills | Status: DC

## 2017-04-12 ENCOUNTER — Other Ambulatory Visit: Payer: Self-pay | Admitting: Cardiovascular Disease

## 2017-04-23 ENCOUNTER — Telehealth: Payer: Self-pay | Admitting: Cardiovascular Disease

## 2017-04-23 ENCOUNTER — Encounter: Payer: Self-pay | Admitting: Cardiovascular Disease

## 2017-04-23 NOTE — Telephone Encounter (Signed)
I spoke with the pt and she is not feeling well today.  The pt complains of extreme fatigue for the past 2 weeks which is similar to what she felt prior to her heart attack.  The pt denies CP, SOB, dizziness or passing out.  The only other complaint the pt has is off and on right arm pain for the past month. The pt would like to see Kristine [name] prior to her 05/12/17 scheduled appointment.  I advised her that Kristine [name] did not have any availability at this time and I recommended that she see a PA/NP.  The pt has previously seen Kristine [name] and she would like to see him again.  I scheduled the pt to see Kristine [name] on 04/28/2017.  The pt will contact the office is she has any further symptoms prior to appointment.

## 2017-04-23 NOTE — Telephone Encounter (Signed)
New Message  Pt would like to be seen by only Dr. Burt Knack sooner than next appt. Pt stats she is having the same symptoms she had prior to her heart attack and would like to be evaluated. Pt was placed on waiting list. Please call back to discuss.

## 2017-04-25 ENCOUNTER — Emergency Department (HOSPITAL_COMMUNITY)
Admission: EM | Admit: 2017-04-25 | Discharge: 2017-04-25 | Disposition: A | Discharge status: Home / Self Care | Payer: BLUE CROSS/BLUE SHIELD | Attending: Emergency Medicine | Admitting: Emergency Medicine

## 2017-04-25 ENCOUNTER — Telehealth: Payer: Self-pay | Admitting: Physician Assistant

## 2017-04-25 ENCOUNTER — Encounter (HOSPITAL_COMMUNITY): Payer: Self-pay | Admitting: Nurse Practitioner

## 2017-04-25 DIAGNOSIS — Z5321 Procedure and treatment not carried out due to patient leaving prior to being seen by health care provider: Secondary | ICD-10-CM | POA: Diagnosis not present

## 2017-04-25 DIAGNOSIS — R5383 Other fatigue: Secondary | ICD-10-CM | POA: Insufficient documentation

## 2017-04-25 DIAGNOSIS — R030 Elevated blood-pressure reading, without diagnosis of hypertension: Secondary | ICD-10-CM | POA: Insufficient documentation

## 2017-04-25 LAB — BASIC METABOLIC PANEL
ANION GAP: 6 (ref 5–15)
BUN: 16 mg/dL (ref 6–20)
CHLORIDE: 106 mmol/L (ref 101–111)
CO2: 28 mmol/L (ref 22–32)
Calcium: 9.7 mg/dL (ref 8.9–10.3)
Creatinine, Ser: 0.69 mg/dL (ref 0.44–1.00)
GFR calc Af Amer: >60 mL/min (ref >60)
GLUCOSE: 108 mg/dL — AB (ref 65–99)
POTASSIUM: 4.7 mmol/L (ref 3.5–5.1)
Sodium: 140 mmol/L (ref 135–145)

## 2017-04-25 LAB — CBC
HEMATOCRIT: 38.4 % (ref 36.0–46.0)
Hemoglobin: 11.8 g/dL — ABNORMAL LOW (ref 12.0–15.0)
MCH: 26.5 pg (ref 26.0–34.0)
MCHC: 30.7 g/dL (ref 30.0–36.0)
MCV: 86.3 fL (ref 78.0–100.0)
Platelets: 166 10*3/uL (ref 150–400)
RBC: 4.45 MIL/uL (ref 3.87–5.11)
RDW: 14.8 % (ref 11.5–15.5)
WBC: 9 10*3/uL (ref 4.0–10.5)

## 2017-04-25 LAB — URINALYSIS, ROUTINE W REFLEX MICROSCOPIC
BILIRUBIN URINE: NEGATIVE
Glucose, UA: NEGATIVE mg/dL
HGB URINE DIPSTICK: NEGATIVE
Ketones, ur: NEGATIVE mg/dL
NITRITE: NEGATIVE
PROTEIN: NEGATIVE mg/dL
SPECIFIC GRAVITY, URINE: 1.01 (ref 1.005–1.030)
pH: 7 (ref 5.0–8.0)

## 2017-04-25 MED ORDER — LOSARTAN POTASSIUM 50 MG PO TABS: 50.0000 mg | ORAL_TABLET | Freq: Every day | ORAL | 3 refills | Status: DC

## 2017-04-25 NOTE — ED Triage Notes (Signed)
Pt presents with c/o fatigue. The fatigue began about 1.5 weeks ago and has been increasingly worse since onset. She has had difficulty completing her normal daily activities due to the fatigue.  She reports generalized weakness, elevated blood pressure readings. She denies any dizziness, chest pain, shortness of breath, unilateral weakness, slurred speech.

## 2017-04-25 NOTE — Addendum Note (Signed)
Addended by: Michae Kava on: 04/25/2017 01:49 PM   Modules accepted: Orders

## 2017-04-25 NOTE — ED Notes (Signed)
Pt up to nurse first desk stating that she feels better and that since she was told her EKG and BP were normal she has decided to leave because she has an appt with her PCP on Monday. Pt encouraged to stay and be seen, pt stated she would return if her symptoms came back.

## 2017-04-25 NOTE — Telephone Encounter (Signed)
Pt has appt with Richardson Dopp, PA 04/28/17 @ 8:45.

## 2017-04-25 NOTE — Telephone Encounter (Signed)
New message    183/81 p-82. Pt states that she just woke up from resting for two hours. She said she wasn't moving around and that she is really scared.

## 2017-04-25 NOTE — Telephone Encounter (Signed)
Pt c/o BP issue: STAT if pt c/o blurred vision, one-sided weakness or slurred speech  1. What are your last 5 BP readings? 159/84 2. Are you having any other symptoms (ex. Dizziness, headache, blurred vision, passed out)? no 3. What is your BP issue? High and feeling weak

## 2017-04-25 NOTE — Telephone Encounter (Signed)
Ptcb as per my earlier note from today with her BP/HR reading. 183/81 p-82. Pt states that she just woke up from resting for two hours. She said she wasn't moving around and that she is really scared.  I tried to return pt's call. I LMOM, that I will d/w Nicki Reaper W. PA the BP reading she just took and I will call her back after I have d/w PA with any further recommendations.

## 2017-04-25 NOTE — Telephone Encounter (Signed)
I called the pt with recommendations from [place] advised to take extra 25 mg Losartan today and starting tomorrow increase Losartan 1 whole tablet every day = 50 mg daily. Pt also advised per Brynda Rim. PA to take 1/2 to 1 whole tab of her xanax today as well. Pt advised if BP still >170/100 or if she develops chest pain, sob or starts to feel worse, she should go to the ED. Pt is agreeable to plan of care with verbalized understanding to instructions. Pt thanked me for my call.

## 2017-04-25 NOTE — Telephone Encounter (Signed)
If BP still >170/100, ok to take extra Losartan 25 mg and increase daily dose to 50 mg. Alprazolam is listed in her medications.  If still feeling anxious, I recommend she take 1/2 to 1 dose of this drug as well. If she develops chest pain or shortness of breath or starts to feel worse, she should go to the ED. Richardson Dopp, PA-C    04/25/2017 1:11 PM

## 2017-04-25 NOTE — Telephone Encounter (Signed)
I returned pt's call who was concerned about her BP and HR today. Pt s/w Dr. Antionette Char nurse Arsenio Katz RN yesterday at 3:30 in regards to pt's c/o of fatigue; see phone note. Pt states today still feels fatigued and BP and HR were high this morning. Pt gave me some readings from this morning:  158/84 HR 88; this was just waking up and had not taken her medications yet  170/84 ;10 minutes after taking her medications  157/74 HR 93 ; this was about 1 hour after taking medications.   Pt takes Losartan 25 mg daily. Takes 1/2 tab of 50 mg tablet.  Pt is very anxious. I advised pt that when she wakes in the morning she wants to go ahead and take her BP medications, wait 1-2 hours then take BP readings. Advised pt to make sure she has been sitting for at least 10-15 minutes before taking BP. Pt denies any chest pain, sob, fevers, edema. Pt does state she is very anxious. I assured pt that her last BP readings of 157/74 was ok and to keep on eye on her BP for me today and to recheck it about 12-12:30 and call me with the reading. I assured pt HR is still normal range (60-100), pt was happy about that. I advised since she is very anxious right now that will raise he BP and her HR. Pt said she will try and relax today. I did advised pt if she sees her BP going 170/90 or higher and HR over 100 then she should call the office and let us know and if it is after the office is closed she can call the [phone] and s/w On-Call Provider for further recommendations or proceed to the ED if she feels that would be appropriate for her. Pt thanked me for my call and my time and she will call me later this afternoon with BP reading.

## 2017-04-27 NOTE — Progress Notes (Signed)
Cardiology Office Note:    Date:  04/28/2017   ID:  Kristine [name], DOB [birth year], MRN [id number]  PCP:  Darlyne Russian, MD  Cardiologist:  Dr. [name]    Referring MD: Darlyne Russian, MD   Chief Complaint  Patient presents with  . Fatigue    History of Present Illness:    Kristine [name] is a 76 y.o. female with a hx of CAD status post lateral STEMI 05/2013 treated with a DES to the OM1, HTN, HL. EF was 35-40% at the time of her cardiac catheterization. Follow-up echocardiogram demonstrated normal LV function with an EF of 50-55%. She had residual disease on her LHC including an occluded small diagonal that filled by L-L collaterals.  She had a nuclear stress test in 2/16 for recurrent chest pain. The study was called intermediate risk b/c of a drop in BP during exercise that was thought to possibly be technical error (initial BP in recovery was higher). There was diaphragmatic attenuation but no ischemia. I reviewed this with Dr. [name] and we felt her study was low risk and recommended continued medical therapy.  She was last seen by Dr. [name] in 03/2016.  She called in recently with fatigue and elevated blood pressure. She ended up going to the ED but left after getting an ECG.  Kristine [name] returns for follow-up. She is here alone. She learned that her blood pressure machine was incorrect and reading 30 points higher. She feels much better about her blood pressure now. She does complain of fatigue for the past 3 weeks. She states that she feels "worn out."  She denies syncope or near syncope. She denies orthopnea or PND. She denies edema. She denies chest discomfort. She does note shortness of breath with more moderate to extreme activities. This seems to be unusual for her. She continues to participate in ballroom dancing once a week. She is quite concerned about her fatigue. She had significant fatigue prior to her myocardial infarction.  Prior CV studies:   The following  studies were reviewed today:  Myoview 02/01/15 Intermediate risk, inferolateral and anterolateral defect most likely secondary to diaphragmatic attenuation, no ischemia.  BP did drop 152/49 >> 137/72mmHg but BP in recovery was 162/55 >> may be tech error; EF 59%.     Echocardiogram 05/2013 Mild focal basal septal hypertrophy, EF 50-55%, apical HK, grade 1 diastolic dysfunction, trivial AI   Cardiac Cath/PCI 05/2013 LM:  20% LAD:   Mid-vessel with 30-40% stenosis. Small diagonal occluded fills from left-left collaterals with late filling. LCx:   1st OM with 90% stenosis, 2nd Om with mild diffuse 30-40% stenosis   RCA:   Mid-vessel with 50-60% stenosis.   Large lateral wall/anterolateral AK, EF 35-40%. PCI Data:  Proximal OM Stent 2.75x16 mm Promus   Carotid US 12/2010 Bilat ICA < 50%   Past Medical History:  Diagnosis Date  . Anemia   . Arthritis   . Barrett's esophagus    Stage B  . CAD (coronary artery disease)    a. s/p STEMI 7/14 >> LHC: LM 20, mLAD 30-40, small Dx 100 (L-L collats), OM1 90, OM2 30-40, mRCA 50-60, Lat/AL akinesis, EF 35-40% >> PCI: Promus DES to OM1;  b. Myoview 3/16: diaph atten, no ischemia, EF 59% (intermediate risk b/c BP drop 152>>137, but recovery BP 162 (tech error) >> med Rx continued   . CREST variant of scleroderma ([place])   . Cystocele   . Diverticulitis   . GERD (  gastroesophageal reflux disease)   . History of blood transfusion    hx of being Anti-K positive  . History of Doppler ultrasound    a. Carotid US 2/12: bilateral ICA < 50%  . Hyperlipidemia   . Hypertension   . Ischemic cardiomyopathy    a. EF 35-40% by LHC at time of MI;  b. Echo 7/14:  Mild focal basal septal hypertrophy, EF 50-55%, apical HK, grade 1 diastolic dysfunction, trivial AI   . Osteopenia 09/2012   T score -2.4 FRAX 13%/2.7%  per 10/15/2012 discussion [name] repeat in 2 years  . Raynaud disease   . Rectocele    mild    Past Surgical History:  Procedure Laterality Date  .  ABDOMINAL HYSTERECTOMY  1996   RSO for menorrhagia.  . CORONARY ANGIOPLASTY WITH STENT PLACEMENT  06/21/2013  . FOOT SURGERY  2013   x2  . JOINT REPLACEMENT  9/10   rt total  . nissen fundoplication  7673  . TONSILLECTOMY  1950  . TOTAL KNEE ARTHROPLASTY     right  . TUBAL LIGATION  1980    Current Medications: Current Meds  Medication Sig  . ALPRAZolam (XANAX) 0.25 MG tablet Take 1 tablet (0.25 mg total) by mouth daily as needed for anxiety.  Marland Kitchen aspirin EC 81 MG EC tablet Take 1 tablet (81 mg total) by mouth daily.  Marland Kitchen ezetimibe (ZETIA) 10 MG tablet TAKE (1/2) TABLET BY MOUTH DAILY  . losartan (COZAAR) 50 MG tablet Take 1 tablet (50 mg total) by mouth daily.  . Multiple Vitamin (MULTIVITAMIN WITH MINERALS) TABS tablet Take 1 tablet by mouth daily.  . nitroGLYCERIN (NITROSTAT) 0.4 MG SL tablet Place 1 tablet (0.4 mg total) under the tongue every 5 (five) minutes x 3 doses as needed for chest pain.  Marland Kitchen omeprazole (PRILOSEC) 40 MG capsule Take 40 mg by mouth daily.   . pravastatin (PRAVACHOL) 10 MG tablet TAKE 1 TABLET (10 MG TOTAL) BY MOUTH EVERY EVENING.  . ranitidine (ZANTAC) 150 MG tablet Take 150 mg by mouth at bedtime.     Allergies:   Aspirin; Pravastatin; Flagyl [metronidazole]; Nsaids; Pravastatin sodium; and Statins   Social History   Social History  . Marital status: Divorced    Spouse name: N/A  . Number of children: N/A  . Years of education: N/A   Social History Main Topics  . Smoking status: Never Smoker  . Smokeless tobacco: Never Used  . Alcohol use 0.0 oz/week     Comment: Occasional  . Drug use: No  . Sexual activity: No   Other Topics Concern  . None   Social History Narrative   Exercise dancing 3 x times weekly for 1-2 hours     Family Hx: The patient's family history includes Asthma in her maternal grandmother; Cancer in her maternal grandfather and mother; Emphysema in her maternal grandmother; Heart attack in her father; Heart disease in her  father; Hepatitis C in her mother; Panic disorder in her daughter. There is no history of Stroke.  ROS:   Please see the history of present illness.    Review of Systems  Constitution: Positive for malaise/fatigue.  HENT: Positive for hearing loss.   Respiratory: Positive for snoring.   Gastrointestinal: Positive for abdominal pain.   All other systems reviewed and are negative.   EKGs/Labs/Other Test Reviewed:    EKG:  EKG is not ordered today.  The ekg ordered today demonstrates n/a ECG from 04/25/17 in the emergency room at  [facility] reviewed - NSR, incomplete RBBB, no acute ST-T wave changes, no change from prior tracing  Recent Labs: 04/25/2017: BUN 16; Creatinine, Ser 0.69; Hemoglobin 11.8; Platelets 166; Potassium 4.7; Sodium 140 04/28/2017: ALT 24; TSH 2.990   Recent Lipid Panel Lab Results  Component Value Date/Time   CHOL 148 04/28/2017 10:03 AM   CHOL 153 07/29/2014 09:26 AM   TRIG 126 04/28/2017 10:03 AM   TRIG 112 07/29/2014 09:26 AM   HDL 45 04/28/2017 10:03 AM   HDL 49 07/29/2014 09:26 AM   CHOLHDL 3.3 04/28/2017 10:03 AM   CHOLHDL 2.6 11/23/2015 09:12 AM   LDLCALC 78 04/28/2017 10:03 AM   LDLCALC 82 07/29/2014 09:26 AM    Physical Exam:    VS:  BP 132/62   Pulse 69   Ht 5\' 1"  (1.549 m)   Wt 133 lb 12.8 oz (60.7 kg)   LMP 11/25/1994   BMI 25.28 kg/m     Wt Readings from Last 3 Encounters:  04/28/17 133 lb 12.8 oz (60.7 kg)  08/08/16 133 lb (60.3 kg)  06/29/16 130 lb 4 oz (59.1 kg)     Physical Exam  Constitutional: She is oriented to person, place, and time. She appears well-developed and well-nourished. No distress.  HENT:  Head: Normocephalic and atraumatic.  Eyes: No scleral icterus.  Neck: Normal range of motion. No JVD present.  Cardiovascular: Normal rate, regular rhythm, S1 normal, S2 normal and normal heart sounds.   No murmur heard. Pulmonary/Chest: Breath sounds normal. She has no wheezes. She has no rhonchi. She has no rales.    Abdominal: Soft. There is no tenderness.  Musculoskeletal: She exhibits no edema.  Neurological: She is alert and oriented to person, place, and time.  Skin: Skin is warm and dry.  Psychiatric: She has a normal mood and affect.    ASSESSMENT:    1. Other fatigue   2. Shortness of breath   3. Coronary artery disease involving native coronary artery of native heart without angina pectoris   4. Essential hypertension   5. Hyperlipidemia, unspecified hyperlipidemia type    [name]:    In order of problems listed above:  1. Other fatigue -  Etiology not entirely clear. She had a hemoglobin drawn in the emergency room recently that was fairly stable. She has started using an oral appliance at night for newly diagnosed sleep apnea. She's had difficulty sleeping with this. This may be the likely cause of her fatigue. However, she did note fatigue prior to her myocardial infarction. She's also had some shortness of breath with exertion. It has been 2 years since her last assessment for ischemia. ECG in the emergency room several days ago was unremarkable.  -  Arrange ETT-echocardiogram  -  Obtain TSH  -  FU with sleep med provider if above unremarkable   2. Shortness of breath -  As noted, she has had some mild shortness of breath with mod to extreme exertion.    -  [name]: ECHOCARDIOGRAM STRESS TEST  3. Coronary artery disease involving native coronary artery of native heart without angina pectoris -  S/p Lat STEMI in 2014 tx with DES to OM1.  Myoview in 3/16 was neg for ischemia.  Given her fatigue and dyspnea on exertion, will get ETT-Echo to rule out ischemia. Continue ASA, statin.  4. Essential hypertension - The patient's blood pressure is controlled on her current regimen.  Continue current therapy.    5. Hyperlipidemia, unspecified hyperlipidemia type - Obtain  Lipids, LFTs.  Continue statin, ezetimibe.   Dispo:  Return in about 6 months (around 10/28/2017) for Routine Follow Up, w/  Dr. Burt Knack.   Medication Adjustments/Labs and Tests Ordered: Current medicines are reviewed at length with the patient today.  Concerns regarding medicines are outlined above.  Orders/Tests:  Orders Placed This Encounter  Procedures  . TSH  . Lipid panel  . Hepatic function panel  . ECHOCARDIOGRAM STRESS TEST   Medication changes: No orders of the defined types were placed in this encounter.  Signed, Richardson Dopp, PA-C  04/28/2017 5:47 PM    [facility] [address], [place], [place]  [address] Phone: [phone]; Fax: [phone]

## 2017-04-28 ENCOUNTER — Ambulatory Visit (INDEPENDENT_AMBULATORY_CARE_PROVIDER_SITE_OTHER): Payer: BLUE CROSS/BLUE SHIELD | Admitting: Physician Assistant

## 2017-04-28 ENCOUNTER — Encounter: Payer: Self-pay | Admitting: Physician Assistant

## 2017-04-28 VITALS — BP 132/62 | HR 69 | Ht 61.0 in | Wt 133.8 lb

## 2017-04-28 DIAGNOSIS — I2510 Atherosclerotic heart disease of native coronary artery without angina pectoris: Secondary | ICD-10-CM

## 2017-04-28 DIAGNOSIS — E785 Hyperlipidemia, unspecified: Secondary | ICD-10-CM | POA: Diagnosis not present

## 2017-04-28 DIAGNOSIS — I10 Essential (primary) hypertension: Secondary | ICD-10-CM

## 2017-04-28 DIAGNOSIS — R5383 Other fatigue: Secondary | ICD-10-CM

## 2017-04-28 DIAGNOSIS — R0602 Shortness of breath: Secondary | ICD-10-CM

## 2017-04-28 LAB — LIPID PANEL
CHOLESTEROL TOTAL: 148 mg/dL (ref 100–199)
Chol/HDL Ratio: 3.3 ratio (ref 0.0–4.4)
HDL: 45 mg/dL (ref >39)
LDL Calculated: 78 mg/dL (ref 0–99)
Triglycerides: 126 mg/dL (ref 0–149)
VLDL CHOLESTEROL CAL: 25 mg/dL (ref 5–40)

## 2017-04-28 LAB — HEPATIC FUNCTION PANEL
ALK PHOS: 114 IU/L (ref 39–117)
ALT: 24 IU/L (ref 0–32)
AST: 19 IU/L (ref 0–40)
Albumin: 4.2 g/dL (ref 3.5–4.8)
BILIRUBIN, DIRECT: 0.09 mg/dL (ref 0.00–0.40)
Bilirubin Total: 0.3 mg/dL (ref 0.0–1.2)
Total Protein: 6.5 g/dL (ref 6.0–8.5)

## 2017-04-28 LAB — TSH: TSH: 2.99 u[IU]/mL (ref 0.450–4.500)

## 2017-04-28 NOTE — Patient Instructions (Signed)
Your physician recommends that you continue on your current medications as directed. Please refer to the Current Medication list given to you today.  Your physician recommends that you return for lab work in:  [place]   TSH  Your physician has requested that you have a stress echocardiogram. For further information please visit [email or web address]. Please follow instruction sheet as given.   Your physician recommends that you schedule a follow-up appointment in:  IF STRESS ECHO NORMAL  MAY  CANCEL  UPCOMING  APPT  WITH DR  Burt Knack  AND  RESCHEDULE  FOR   6 MONTHS

## 2017-04-29 ENCOUNTER — Telehealth: Payer: Self-pay | Admitting: *Deleted

## 2017-04-29 NOTE — Telephone Encounter (Signed)
-----   Message from Liliane Shi, [place] sent at 04/28/2017  9:44 PM EDT ----- Please call the patient TSH normal Lipids ok LFTs normal. Continue with current treatment plan. Richardson Dopp, PA-C   04/28/2017 9:44 PM

## 2017-04-29 NOTE — Telephone Encounter (Signed)
Late Entry: I went to call pt back from her call with her BP reading at 12 pm. I then saw pt was in the ED. Pt had appt with Scott w. PA 04/28/17.Marland Kitchen

## 2017-04-29 NOTE — Telephone Encounter (Signed)
DPR ok to Lmom. Lmom lab work ok, thyroid function normal, liver function normal, lipid panel ok as well. Continue on current Tx plan. If any questions please call [phone].

## 2017-05-12 ENCOUNTER — Ambulatory Visit: Payer: BLUE CROSS/BLUE SHIELD | Admitting: Cardiovascular Disease

## 2017-05-13 ENCOUNTER — Telehealth (HOSPITAL_COMMUNITY): Payer: Self-pay

## 2017-05-13 NOTE — Telephone Encounter (Signed)
Spoke with the pt and gave her instructions for her Stress ECHO on 05/20/2017 at 7:30 am. Pt stated that she understood. S.Alysandra Lobue EMTP

## 2017-05-20 ENCOUNTER — Ambulatory Visit (HOSPITAL_COMMUNITY): Payer: BLUE CROSS/BLUE SHIELD | Attending: Cardiology

## 2017-05-20 ENCOUNTER — Ambulatory Visit (HOSPITAL_COMMUNITY): Payer: BLUE CROSS/BLUE SHIELD

## 2017-05-20 DIAGNOSIS — I2510 Atherosclerotic heart disease of native coronary artery without angina pectoris: Secondary | ICD-10-CM | POA: Diagnosis not present

## 2017-05-20 DIAGNOSIS — R9439 Abnormal result of other cardiovascular function study: Secondary | ICD-10-CM | POA: Insufficient documentation

## 2017-05-20 DIAGNOSIS — R0602 Shortness of breath: Secondary | ICD-10-CM | POA: Insufficient documentation

## 2017-05-20 DIAGNOSIS — R5383 Other fatigue: Secondary | ICD-10-CM | POA: Insufficient documentation

## 2017-05-21 ENCOUNTER — Telehealth: Payer: Self-pay | Admitting: *Deleted

## 2017-05-21 NOTE — Telephone Encounter (Signed)
-----   Message from Burtis Junes, NP sent at 05/21/2017  7:29 AM EDT ----- Reviewed for Richardson Dopp, PA Abnormal stress echo. Cardiac cath recommended. Please arrange pre cath visit with [facility] ASAP

## 2017-05-21 NOTE — Telephone Encounter (Signed)
Pt has been notified of ETT/Echo results and findings by phone with verbal understanding. Pt agreeable to plan of care for appt ASAP to discuss results and cath. Pt has been scheduled to see Cecilie Kicks, NP 2:30 05/26/17. Pt thanked me for my call and taking the time to go over her results and recommendations.

## 2017-05-24 ENCOUNTER — Other Ambulatory Visit: Payer: Self-pay | Admitting: Cardiovascular Disease

## 2017-05-24 DIAGNOSIS — I10 Essential (primary) hypertension: Secondary | ICD-10-CM

## 2017-05-26 ENCOUNTER — Encounter (INDEPENDENT_AMBULATORY_CARE_PROVIDER_SITE_OTHER): Payer: Self-pay

## 2017-05-26 ENCOUNTER — Ambulatory Visit (INDEPENDENT_AMBULATORY_CARE_PROVIDER_SITE_OTHER): Payer: BLUE CROSS/BLUE SHIELD | Admitting: Cardiology

## 2017-05-26 ENCOUNTER — Encounter: Payer: Self-pay | Admitting: Cardiology

## 2017-05-26 VITALS — BP 132/80 | HR 74 | Ht 61.0 in | Wt 134.0 lb

## 2017-05-26 DIAGNOSIS — I209 Angina pectoris, unspecified: Secondary | ICD-10-CM | POA: Diagnosis not present

## 2017-05-26 DIAGNOSIS — R9439 Abnormal result of other cardiovascular function study: Secondary | ICD-10-CM

## 2017-05-26 DIAGNOSIS — I2510 Atherosclerotic heart disease of native coronary artery without angina pectoris: Secondary | ICD-10-CM | POA: Diagnosis not present

## 2017-05-26 DIAGNOSIS — Z01818 Encounter for other preprocedural examination: Secondary | ICD-10-CM | POA: Diagnosis not present

## 2017-05-26 DIAGNOSIS — I10 Essential (primary) hypertension: Secondary | ICD-10-CM | POA: Diagnosis not present

## 2017-05-26 DIAGNOSIS — E782 Mixed hyperlipidemia: Secondary | ICD-10-CM | POA: Diagnosis not present

## 2017-05-26 DIAGNOSIS — R5383 Other fatigue: Secondary | ICD-10-CM

## 2017-05-26 NOTE — Patient Instructions (Signed)
Medication Instructions:  Your physician recommends that you continue on your current medications as directed. Please refer to the Current Medication list given to you today.   Labwork: Your physician recommends that you return for lab work today for CBC, BMET, PT/INR  Testing/Procedures: None Ordered   Follow-Up:   Any Other Special Instructions Will Be Listed Below (If Applicable).      [facility] [address], [place] [address] [place] [address] Dept: [phone] Loc: [phone]  Tyquisha Sharps  05/26/2017  You are scheduled for a left heart cath on Friday July 6  with Dr. Burt Knack.  1. Please arrive at the [facility] (Main Entrance A) at [facility]: [address] [place], [place] [address] at 10:30 (two hours before your procedure to ensure your preparation). Free valet parking service is available.   Special note: Every effort is made to have your procedure done on time. Please understand that emergencies sometimes delay scheduled procedures.  2. Diet: nothing to eat or drink after midnight the night before  3. Labs: 05/26/17  4. Please only take your aspirin the morning of the procedure. Hold all other medications per Cecilie Kicks, NP 5. Plan for one night stay--bring personal belongings. 6. Bring a current list of your medications and current insurance cards. 7. You MUST have a responsible person to drive you home. 8. Someone MUST be with you the first 24 hours after you arrive home or your discharge will be delayed. 9. Please wear clothes that are easy to get on and off and wear slip-on shoes.  Thank you for allowing [place] to care for you!   -- [facility]     If you need a refill on your cardiac medications before your next appointment, please call your pharmacy.

## 2017-05-26 NOTE — Progress Notes (Signed)
Cardiology Office Note   Date:  05/26/2017   ID:  Kristine [name], DOB 04/08/1941, MRN [id number]  PCP:  Wardell Honour, MD  Cardiologist:  Dr. Burt Knack    Chief Complaint  Patient presents with  . Fatigue      History of Present Illness: Kristine [name] is a 76 y.o. female who presents for discussion of cardiac cath with abnormal Echo stress test.  hx of CAD status post lateral STEMI 05/2013 treated with a DES to the OM1, HTN, HL. EF was 35-40% at the time of her cardiac catheterization. Follow-up echocardiogram demonstrated normal LV function with an EF of 50-55%. She had residual disease on her LHC including an occluded small diagonal that filled by L-L collaterals. She had a nuclear stress test in 2/16 for recurrent chest pain. The study was called intermediate risk b/c of a drop in BP during exercise that was thought to possibly be technical error (initial BP in recovery was higher). There was diaphragmatic attenuation but no ischemia. I reviewed this with Dr. [name] and we felt her study was low risk and recommended continued medical therapy.  She was last seen by Dr. [name] in 03/2016.  She called in recently with fatigue and elevated blood pressure. She ended up going to the ED but left after getting an ECG.  Last seen 04/28/17 for fatigue, with stable labs and DOE.  She did have fatigue prior to previous MI.  Stress echo was done and revealed.   The stress apical 2 images is slightly rotated but shows hypokinesis in the apical inferior ( inferior septal ) region  Suggest cardiac cath per reader Dr. Acie Fredrickson.    Today she is still with fatigue, this is how she felt prior to MI.  We discussed cardiac cath in detail.  She would like to proceed and with Dr. Burt Knack planned for Friday.  She has no bleeding, no syncope and no palpitations.  Past Medical History:  Diagnosis Date  . Anemia   . Arthritis   . Barrett's esophagus    Stage B  . CAD (coronary artery disease)    a. s/p  STEMI 7/14 >> LHC: LM 20, mLAD 30-40, small Dx 100 (L-L collats), OM1 90, OM2 30-40, mRCA 50-60, Lat/AL akinesis, EF 35-40% >> PCI: Promus DES to OM1;  b. Myoview 3/16: diaph atten, no ischemia, EF 59% (intermediate risk b/c BP drop 152>>137, but recovery BP 162 (tech error) >> med Rx continued   . CREST variant of scleroderma ([place])   . Cystocele   . Diverticulitis   . GERD (gastroesophageal reflux disease)   . History of blood transfusion    hx of being Anti-K positive  . History of Doppler ultrasound    a. Carotid US 2/12: bilateral ICA < 50%  . Hyperlipidemia   . Hypertension   . Ischemic cardiomyopathy    a. EF 35-40% by LHC at time of MI;  b. Echo 7/14:  Mild focal basal septal hypertrophy, EF 50-55%, apical HK, grade 1 diastolic dysfunction, trivial AI   . Osteopenia 09/2012   T score -2.4 FRAX 13%/2.7%  per 10/15/2012 discussion plan repeat in 2 years  . Raynaud disease   . Rectocele    mild    Past Surgical History:  Procedure Laterality Date  . ABDOMINAL HYSTERECTOMY  1996   RSO for menorrhagia.  . CORONARY ANGIOPLASTY WITH STENT PLACEMENT  06/21/2013  . FOOT SURGERY  2013   x2  . JOINT REPLACEMENT  9/10   rt total  . nissen fundoplication  4562  . TONSILLECTOMY  1950  . TOTAL KNEE ARTHROPLASTY     right  . TUBAL LIGATION  1980     Current Outpatient Prescriptions  Medication Sig Dispense Refill  . ALPRAZolam (XANAX) 0.25 MG tablet Take 1 tablet (0.25 mg total) by mouth daily as needed for anxiety. 30 tablet 0  . aspirin EC 81 MG EC tablet Take 1 tablet (81 mg total) by mouth daily.    Marland Kitchen ezetimibe (ZETIA) 10 MG tablet TAKE (1/2) TABLET BY MOUTH DAILY 15 tablet 1  . losartan (COZAAR) 50 MG tablet TAKE 1/2 TABLET BY MOUTH ONCE DAILY 15 tablet 6  . Multiple Vitamin (MULTIVITAMIN WITH MINERALS) TABS tablet Take 1 tablet by mouth daily.    . nitroGLYCERIN (NITROSTAT) 0.4 MG SL tablet Place 1 tablet (0.4 mg total) under the tongue every 5 (five) minutes x 3 doses as  needed for chest pain. 25 tablet 12  . omeprazole (PRILOSEC) 40 MG capsule Take 40 mg by mouth daily.     . pravastatin (PRAVACHOL) 10 MG tablet TAKE 1 TABLET (10 MG TOTAL) BY MOUTH EVERY EVENING. 90 tablet 2  . ranitidine (ZANTAC) 150 MG tablet Take 150 mg by mouth at bedtime.     No current facility-administered medications for this visit.     Allergies:   Aspirin; Pravastatin; Flagyl [metronidazole]; Nsaids; Pravastatin sodium; and Statins    Social History:  The patient  reports that she has never smoked. She has never used smokeless tobacco. She reports that she drinks alcohol. She reports that she does not use drugs.   Family History:  The patient's family history includes Asthma in her maternal grandmother; Cancer in her maternal grandfather and mother; Emphysema in her maternal grandmother; Heart attack in her father; Heart disease in her father; Hepatitis C in her mother; Panic disorder in her daughter.    ROS:  General:no colds or fevers, no weight changes, increased fatigue Skin:no rashes or ulcers HEENT:no blurred vision, no congestion CV:see HPI PUL:see HPI GI:no diarrhea constipation or melena, no indigestion GU:no hematuria, no dysuria MS:no joint pain, no claudication Neuro:no syncope, no lightheadedness Endo:no diabetes, no thyroid disease  Wt Readings from Last 3 Encounters:  05/26/17 134 lb (60.8 kg)  04/28/17 133 lb 12.8 oz (60.7 kg)  08/08/16 133 lb (60.3 kg)     PHYSICAL EXAM: VS:  BP 132/80   Pulse 74   Ht 5\' 1"  (1.549 m)   Wt 134 lb (60.8 kg)   LMP 11/25/1994   BMI 25.32 kg/m  , BMI Body mass index is 25.32 kg/m. General:Pleasant affect, NAD Skin:Warm and dry, brisk capillary refill HEENT:normocephalic, sclera clear, mucus membranes moist Neck:supple, no JVD, no bruits  Heart:S1S2 RRR without murmur, gallup, rub or click Lungs:clear without rales, rhonchi, or wheezes BWL:SLHT, non tender, + BS, do not palpate liver spleen or masses Ext:no lower  ext edema, 2+ pedal pulses, 2+ radial pulses Neuro:alert and oriented x 3, MAE, follows commands, + facial symmetry    EKG:  EKG is not ordered today.    Recent Labs: 04/25/2017: BUN 16; Creatinine, Ser 0.69; Hemoglobin 11.8; Platelets 166; Potassium 4.7; Sodium 140 04/28/2017: ALT 24; TSH 2.990    Lipid Panel    Component Value Date/Time   CHOL 148 04/28/2017 1003   CHOL 153 07/29/2014 0926   TRIG 126 04/28/2017 1003   TRIG 112 07/29/2014 0926   HDL 45 04/28/2017 1003   HDL  49 07/29/2014 0926   CHOLHDL 3.3 04/28/2017 1003   CHOLHDL 2.6 11/23/2015 0912   VLDL 17 11/23/2015 0912   LDLCALC 78 04/28/2017 1003   LDLCALC 82 07/29/2014 0926       Other studies Reviewed: Additional studies/ records that were reviewed today include:  Echo Impressions:  - Abnormal stress echo.   She has normal LV function at rest.   The stress apical 2 images is slightly rotated but shows   hypokinesis in the apical inferior ( inferior septal ) region   Suggest cardiac cath  Recommendations:  Suggest Cardiac cath   Last nuc 2016 no ischemia, did have drop in BP.  Echo 05/2013 Study Conclusions  - Left ventricle: The cavity size was normal. There was mild focal basal hypertrophy of the septum. Systolic function was normal. The estimated ejection fraction was in the range of 50% to 55%. There is mild hypokinesis of the apical myocardium. Doppler parameters are consistent with abnormal left ventricular relaxation (grade 1 diastolic dysfunction). - Aortic valve: Trivial regurgitation. -------------------------------------------------------------------  Cardiac Cath/PCI 05/2013 LM:20% LAD:Mid-vessel with 30-40% stenosis. Small diagonal occluded fills from left-left collaterals with late filling. LCx:1st OM with 90% stenosis, 2nd Om with mild diffuse 30-40% stenosis  RCA:Mid-vessel with 50-60% stenosis. Large lateral wall/anterolateral AK, EF 35-40%. PCI  Data:Proximal OM Stent 2.75x16 mm Promus  Carotid US 12/2010 Bilat ICA < 50%  ASSESSMENT AND PLAN:  1.  Fatigue is anginal equivalent.  Now with abnormal stress echo with lateral ischemia.  Plan for cardiac cath with Dr. Burt Knack on Friday, he is aware.  Reviewed procedure and risks with pt and how long she may stay in the hospital. ( was also seen in ER 04/25/17 with this complaint)  TSH normal.   The patient understands that risks included but are not limited to stroke (1 in 1000), death (1 in 1000), kidney failure [usually temporary] (1 in 500), bleeding (1 in 200), allergic reaction [possibly serious] (1 in 200).     2.   DOE with exertion.  For cath  3. CAD with hx of STEMI in 2014 , Lateral wall.  Continue asa and statin.  4.  HTN controlled.  5. HLD LDL 78 continue statin   Current medicines are reviewed with the patient today.  The patient Has no concerns regarding medicines.  The following changes have been made:  See above Labs/ tests ordered today include:see above  Disposition:   FU:  see above  Signed, Cecilie Kicks, NP  05/26/2017 2:47 PM    [facility] [place], [place], [place], [place] Phone: [phone]; Fax: [phone]

## 2017-05-27 LAB — BASIC METABOLIC PANEL
BUN/Creatinine Ratio: 16 (ref 12–28)
BUN: 13 mg/dL (ref 8–27)
CALCIUM: 9.6 mg/dL (ref 8.7–10.3)
CO2: 26 mmol/L (ref 20–29)
Chloride: 103 mmol/L (ref 96–106)
Creatinine, Ser: 0.79 mg/dL (ref 0.57–1.00)
GFR calc Af Amer: 85 mL/min/{1.73_m2} (ref >59)
GFR, EST NON AFRICAN AMERICAN: 74 mL/min/{1.73_m2} (ref >59)
Glucose: 90 mg/dL (ref 65–99)
Potassium: 4.6 mmol/L (ref 3.5–5.2)
Sodium: 143 mmol/L (ref 134–144)

## 2017-05-27 LAB — CBC
HEMATOCRIT: 38.5 % (ref 34.0–46.6)
Hemoglobin: 12.2 g/dL (ref 11.1–15.9)
MCH: 26.8 pg (ref 26.6–33.0)
MCHC: 31.7 g/dL (ref 31.5–35.7)
MCV: 84 fL (ref 79–97)
Platelets: 213 10*3/uL (ref 150–379)
RBC: 4.56 x10E6/uL (ref 3.77–5.28)
RDW: 15.1 % (ref 12.3–15.4)
WBC: 7.3 10*3/uL (ref 3.4–10.8)

## 2017-05-27 LAB — PROTIME-INR
INR: 0.9 (ref 0.8–1.2)
PROTHROMBIN TIME: 10 s (ref 9.1–12.0)

## 2017-05-29 ENCOUNTER — Other Ambulatory Visit: Payer: Self-pay | Admitting: Cardiovascular Disease

## 2017-05-29 ENCOUNTER — Telehealth: Payer: Self-pay

## 2017-05-29 NOTE — Telephone Encounter (Signed)
Patient contacted pre-catheterization at [facility] scheduled for: 05/30/2017 @ 1230 Verified arrival time and place:  NT @ 1030-gave Pt directions to NT Confirmed AM meds to be taken pre-cath with sip of water: verified with Dr. Antionette Char nurse-Pt may take all AM meds with sip of water. Confirmed patient has responsible person to drive home post procedure and observe patient for 24 hours:  Daughter and grandson Addl concerns:  Pt asked if she could take all her am meds.  Per review and verify with Lauren, Pt may take all AM meds.  Pt asked for directions to NT.  Directions given.  Pt asked if she would be awake if they had to place a stent, or would they put her to sleep.  Notified Pt she would be awake if they placed a stent.  Pt asked if she automatically had to stay if she got a stent.  Notified Pt that she might still be discharged, notified of 6 hour wait if she got a stent.  Pt states she will bring overnight bag just in case.  Pt thanked this nurse for call.  No further needs.

## 2017-05-30 ENCOUNTER — Other Ambulatory Visit: Payer: Self-pay

## 2017-05-30 ENCOUNTER — Ambulatory Visit (HOSPITAL_COMMUNITY)
Admission: RE | Admit: 2017-05-30 | Discharge: 2017-05-30 | Disposition: A | Discharge status: Home / Self Care | Payer: BLUE CROSS/BLUE SHIELD | Source: Ambulatory Visit | Attending: Cardiovascular Disease | Admitting: Cardiovascular Disease

## 2017-05-30 ENCOUNTER — Encounter (HOSPITAL_COMMUNITY): Payer: Self-pay | Admitting: Cardiovascular Disease

## 2017-05-30 ENCOUNTER — Encounter (HOSPITAL_COMMUNITY)
Admission: RE | Disposition: A | Discharge status: Home / Self Care | Payer: Self-pay | Source: Ambulatory Visit | Attending: Cardiovascular Disease

## 2017-05-30 DIAGNOSIS — I10 Essential (primary) hypertension: Secondary | ICD-10-CM | POA: Insufficient documentation

## 2017-05-30 DIAGNOSIS — Z79899 Other long term (current) drug therapy: Secondary | ICD-10-CM | POA: Diagnosis not present

## 2017-05-30 DIAGNOSIS — Z96651 Presence of right artificial knee joint: Secondary | ICD-10-CM | POA: Insufficient documentation

## 2017-05-30 DIAGNOSIS — Z955 Presence of coronary angioplasty implant and graft: Secondary | ICD-10-CM | POA: Diagnosis not present

## 2017-05-30 DIAGNOSIS — R931 Abnormal findings on diagnostic imaging of heart and coronary circulation: Secondary | ICD-10-CM | POA: Diagnosis present

## 2017-05-30 DIAGNOSIS — Z9851 Tubal ligation status: Secondary | ICD-10-CM | POA: Diagnosis not present

## 2017-05-30 DIAGNOSIS — I252 Old myocardial infarction: Secondary | ICD-10-CM | POA: Insufficient documentation

## 2017-05-30 DIAGNOSIS — Z7982 Long term (current) use of aspirin: Secondary | ICD-10-CM | POA: Diagnosis not present

## 2017-05-30 DIAGNOSIS — K219 Gastro-esophageal reflux disease without esophagitis: Secondary | ICD-10-CM | POA: Diagnosis not present

## 2017-05-30 DIAGNOSIS — I2510 Atherosclerotic heart disease of native coronary artery without angina pectoris: Secondary | ICD-10-CM | POA: Diagnosis not present

## 2017-05-30 DIAGNOSIS — E785 Hyperlipidemia, unspecified: Secondary | ICD-10-CM | POA: Diagnosis not present

## 2017-05-30 DIAGNOSIS — Z886 Allergy status to analgesic agent status: Secondary | ICD-10-CM | POA: Insufficient documentation

## 2017-05-30 DIAGNOSIS — R9439 Abnormal result of other cardiovascular function study: Secondary | ICD-10-CM

## 2017-05-30 HISTORY — PX: CORONARY STENT INTERVENTION: CATH118234

## 2017-05-30 HISTORY — PX: LEFT HEART CATH AND CORONARY ANGIOGRAPHY: CATH118249

## 2017-05-30 HISTORY — PX: CORONARY PRESSURE/FFR STUDY: CATH118243

## 2017-05-30 LAB — POCT ACTIVATED CLOTTING TIME
ACTIVATED CLOTTING TIME: 235 s
ACTIVATED CLOTTING TIME: 290 s

## 2017-05-30 SURGERY — LEFT HEART CATH AND CORONARY ANGIOGRAPHY
Anesthesia: LOCAL

## 2017-05-30 MED ORDER — CLOPIDOGREL BISULFATE 75 MG PO TABS: 75.0000 mg | ORAL_TABLET | Freq: Every day | ORAL | Status: DC

## 2017-05-30 MED ORDER — SODIUM CHLORIDE 0.9% FLUSH: 3.0000 mL | Freq: Two times a day (BID) | INTRAVENOUS | Status: DC

## 2017-05-30 MED ORDER — NITROGLYCERIN 0.4 MG SL SUBL: 0.4000 mg | SUBLINGUAL_TABLET | SUBLINGUAL | Status: DC | PRN

## 2017-05-30 MED ORDER — FAMOTIDINE 20 MG PO TABS: 20.0000 mg | ORAL_TABLET | Freq: Every day | ORAL | Status: DC

## 2017-05-30 MED ORDER — SODIUM CHLORIDE 0.9% FLUSH: 3.0000 mL | INTRAVENOUS | Status: DC | PRN

## 2017-05-30 MED ORDER — IOPAMIDOL (ISOVUE-370) INJECTION 76%
INTRAVENOUS | Status: AC
  Filled 2017-05-30: qty 100

## 2017-05-30 MED ORDER — ANGIOPLASTY BOOK
Freq: Once | Status: DC
  Filled 2017-05-30 (×2): qty 1

## 2017-05-30 MED ORDER — ADULT MULTIVITAMIN W/MINERALS CH: 1.0000 | ORAL_TABLET | Freq: Every day | ORAL | Status: DC

## 2017-05-30 MED ORDER — CLOPIDOGREL BISULFATE 300 MG PO TABS
ORAL_TABLET | ORAL | Status: AC
  Filled 2017-05-30: qty 2

## 2017-05-30 MED ORDER — LABETALOL HCL 5 MG/ML IV SOLN
INTRAVENOUS | Status: AC
  Filled 2017-05-30: qty 4

## 2017-05-30 MED ORDER — MIDAZOLAM HCL 2 MG/2ML IJ SOLN
INTRAMUSCULAR | Status: AC
  Filled 2017-05-30: qty 2

## 2017-05-30 MED ORDER — ADENOSINE 12 MG/4ML IV SOLN
INTRAVENOUS | Status: AC
  Filled 2017-05-30: qty 16

## 2017-05-30 MED ORDER — FENTANYL CITRATE (PF) 100 MCG/2ML IJ SOLN
INTRAMUSCULAR | Status: DC | PRN
  Administered 2017-05-30: 25 ug via INTRAVENOUS

## 2017-05-30 MED ORDER — SODIUM CHLORIDE 0.9 % WEIGHT BASED INFUSION: 1.0000 mL/kg/h | INTRAVENOUS | Status: DC

## 2017-05-30 MED ORDER — HYDRALAZINE HCL 20 MG/ML IJ SOLN: 10.0000 mg | INTRAMUSCULAR | Status: DC | PRN

## 2017-05-30 MED ORDER — NITROGLYCERIN 1 MG/10 ML FOR IR/CATH LAB
INTRA_ARTERIAL | Status: DC | PRN
  Administered 2017-05-30: 150 ug via INTRACORONARY

## 2017-05-30 MED ORDER — HEPARIN (PORCINE) IN NACL 2-0.9 UNIT/ML-% IJ SOLN
INTRAMUSCULAR | Status: AC
  Filled 2017-05-30: qty 500

## 2017-05-30 MED ORDER — ACETAMINOPHEN 500 MG PO TABS
ORAL_TABLET | ORAL | Status: AC
  Administered 2017-05-30: 1000 mg via ORAL
  Filled 2017-05-30: qty 2

## 2017-05-30 MED ORDER — IOPAMIDOL (ISOVUE-370) INJECTION 76%
INTRAVENOUS | Status: AC
  Filled 2017-05-30: qty 50

## 2017-05-30 MED ORDER — HEPARIN SODIUM (PORCINE) 1000 UNIT/ML IJ SOLN
INTRAMUSCULAR | Status: AC
  Filled 2017-05-30: qty 1

## 2017-05-30 MED ORDER — PRAVASTATIN SODIUM 10 MG PO TABS: 10.0000 mg | ORAL_TABLET | Freq: Every evening | ORAL | Status: DC

## 2017-05-30 MED ORDER — IOPAMIDOL (ISOVUE-370) INJECTION 76%
INTRAVENOUS | Status: DC | PRN
  Administered 2017-05-30: 140 mL via INTRAVENOUS

## 2017-05-30 MED ORDER — LOSARTAN POTASSIUM 25 MG PO TABS: 25.0000 mg | ORAL_TABLET | Freq: Every day | ORAL | Status: DC

## 2017-05-30 MED ORDER — ONDANSETRON HCL 4 MG/2ML IJ SOLN: 4.0000 mg | Freq: Four times a day (QID) | INTRAMUSCULAR | Status: DC | PRN

## 2017-05-30 MED ORDER — HEPARIN SODIUM (PORCINE) 1000 UNIT/ML IJ SOLN
INTRAMUSCULAR | Status: DC | PRN
  Administered 2017-05-30: 2000 [IU] via INTRAVENOUS
  Administered 2017-05-30 (×2): 3000 [IU] via INTRAVENOUS

## 2017-05-30 MED ORDER — CLOPIDOGREL BISULFATE 75 MG PO TABS: 75.0000 mg | ORAL_TABLET | Freq: Every day | ORAL | 0 refills | Status: DC

## 2017-05-30 MED ORDER — ADENOSINE (DIAGNOSTIC) 140MCG/KG/MIN
INTRAVENOUS | Status: DC | PRN
  Administered 2017-05-30: 140 ug/kg/min via INTRAVENOUS

## 2017-05-30 MED ORDER — ASPIRIN 81 MG PO TBEC: 81.0000 mg | DELAYED_RELEASE_TABLET | Freq: Every day | ORAL | Status: DC

## 2017-05-30 MED ORDER — CLOPIDOGREL BISULFATE 300 MG PO TABS
ORAL_TABLET | ORAL | Status: DC | PRN
  Administered 2017-05-30: 600 mg via ORAL

## 2017-05-30 MED ORDER — EZETIMIBE 10 MG PO TABS: 10.0000 mg | ORAL_TABLET | Freq: Every day | ORAL | Status: DC

## 2017-05-30 MED ORDER — SODIUM CHLORIDE 0.9 % IV SOLN: 250.0000 mL | INTRAVENOUS | Status: DC | PRN

## 2017-05-30 MED ORDER — ACETAMINOPHEN 500 MG PO TABS
1000.0000 mg | ORAL_TABLET | Freq: Four times a day (QID) | ORAL | Status: DC | PRN
  Administered 2017-05-30: 1000 mg via ORAL

## 2017-05-30 MED ORDER — LABETALOL HCL 5 MG/ML IV SOLN
10.0000 mg | INTRAVENOUS | Status: DC | PRN
  Administered 2017-05-30: 10 mg via INTRAVENOUS

## 2017-05-30 MED ORDER — FENTANYL CITRATE (PF) 100 MCG/2ML IJ SOLN
INTRAMUSCULAR | Status: AC
  Filled 2017-05-30: qty 2

## 2017-05-30 MED ORDER — ALPRAZOLAM 0.25 MG PO TABS: 0.2500 mg | ORAL_TABLET | Freq: Every day | ORAL | Status: DC | PRN

## 2017-05-30 MED ORDER — NITROGLYCERIN 1 MG/10 ML FOR IR/CATH LAB
INTRA_ARTERIAL | Status: AC
  Filled 2017-05-30: qty 10

## 2017-05-30 MED ORDER — ASPIRIN 81 MG PO CHEW: 81.0000 mg | CHEWABLE_TABLET | ORAL | Status: DC

## 2017-05-30 MED ORDER — LABETALOL HCL 5 MG/ML IV SOLN
INTRAVENOUS | Status: DC | PRN
  Administered 2017-05-30: 10 mg via INTRAVENOUS

## 2017-05-30 MED ORDER — SODIUM CHLORIDE 0.9 % WEIGHT BASED INFUSION
3.0000 mL/kg/h | INTRAVENOUS | Status: AC
  Administered 2017-05-30: 3 mL/kg/h via INTRAVENOUS

## 2017-05-30 MED ORDER — VERAPAMIL HCL 2.5 MG/ML IV SOLN
INTRAVENOUS | Status: DC | PRN
  Administered 2017-05-30: 13:00:00 via INTRA_ARTERIAL

## 2017-05-30 MED ORDER — CLOPIDOGREL BISULFATE 75 MG PO TABS: 75.0000 mg | ORAL_TABLET | Freq: Every day | ORAL | 1 refills | Status: DC

## 2017-05-30 MED ORDER — LIDOCAINE HCL (PF) 1 % IJ SOLN
INTRAMUSCULAR | Status: DC | PRN
  Administered 2017-05-30: 2 mL

## 2017-05-30 MED ORDER — HEPARIN (PORCINE) IN NACL 2-0.9 UNIT/ML-% IJ SOLN
INTRAMUSCULAR | Status: DC | PRN
  Administered 2017-05-30: 13:00:00

## 2017-05-30 MED ORDER — MIDAZOLAM HCL 2 MG/2ML IJ SOLN
INTRAMUSCULAR | Status: DC | PRN
  Administered 2017-05-30: 2 mg via INTRAVENOUS

## 2017-05-30 MED ORDER — LIDOCAINE HCL 1 % IJ SOLN
INTRAMUSCULAR | Status: AC
  Filled 2017-05-30: qty 20

## 2017-05-30 MED ORDER — LABETALOL HCL 5 MG/ML IV SOLN
INTRAVENOUS | Status: AC
  Administered 2017-05-30: 10 mg via INTRAVENOUS
  Filled 2017-05-30: qty 4

## 2017-05-30 MED ORDER — ACETAMINOPHEN 500 MG PO TABS
ORAL_TABLET | ORAL | Status: AC
  Filled 2017-05-30: qty 2

## 2017-05-30 MED FILL — CLOPIDOGREL 75 MG TABLET: 75 | 30 days supply | Qty: 30 | Fill #0

## 2017-05-30 SURGICAL SUPPLY — 23 items
BALLN SAPPHIRE 2.5X12 (BALLOONS) ×2
BALLN ~~LOC~~ EMERGE MR 3.75X8 (BALLOONS) ×2
BALLOON SAPPHIRE 2.5X12 (BALLOONS) IMPLANT
BALLOON ~~LOC~~ EMERGE MR 3.75X8 (BALLOONS) IMPLANT
CATH EXPO 5F FL3.5 (CATHETERS) ×1 IMPLANT
CATH INFINITI JR4 5F (CATHETERS) ×1 IMPLANT
CATH LAUNCHER 5F EBU3.0 (CATHETERS) IMPLANT
CATH MICROCATH NAVVUS (MICROCATHETER) IMPLANT
CATHETER LAUNCHER 5F EBU3.0 (CATHETERS) ×2
DEVICE RAD COMP TR BAND LRG (VASCULAR PRODUCTS) IMPLANT
DEVICE RAD TR BAND REGULAR (VASCULAR PRODUCTS) ×1 IMPLANT
GLIDESHEATH SLEND SS 6F .021 (SHEATH) ×1 IMPLANT
GUIDEWIRE INQWIRE 1.5J.035X260 (WIRE) IMPLANT
INQWIRE 1.5J .035X260CM (WIRE) ×2
KIT ENCORE 26 ADVANTAGE (KITS) ×1 IMPLANT
KIT HEART LEFT (KITS) ×2 IMPLANT
MICROCATHETER NAVVUS (MICROCATHETER) ×2
PACK CARDIAC CATHETERIZATION (CUSTOM PROCEDURE TRAY) ×2 IMPLANT
STENT PROMUS PREM MR 3.5X16 (Permanent Stent) ×1 IMPLANT
TRANSDUCER W/STOPCOCK (MISCELLANEOUS) ×2 IMPLANT
TUBING CIL FLEX 10 FLL-RA (TUBING) ×2 IMPLANT
WIRE COUGAR XT STRL 190CM (WIRE) ×1 IMPLANT
WIRE HI TORQ VERSACORE-J 145CM (WIRE) ×1 IMPLANT

## 2017-05-30 NOTE — Discharge Instructions (Signed)

## 2017-05-30 NOTE — Progress Notes (Signed)
[id number] Education completed with pt who voiced understanding. Stressed importance of plavix with stent. Gave heart healthy diet and ex ed. Reviewed NTG use. Has attended CRP 2 [facility] before. Will refer back at pt's request.  Graylon Good RN BSN 05/30/2017 3:20 PM

## 2017-05-30 NOTE — Discharge Summary (Signed)
Discharge Summary    Patient ID: Kristine [name],  MRN: [id number], DOB/AGE: 1941-04-11 76 y.o.  Admit date: 05/30/2017 Discharge date: 05/30/2017  Primary Care Provider: Wardell Honour Primary Cardiologist: Burt Knack   Discharge Diagnoses    Active Problems:   Abnormal nuclear cardiac imaging test   Abnormal stress echo   Allergies Allergies  Allergen Reactions  . Aspirin Other (See Comments)    Other reaction(s): Other Can take 81 mg but not aspirin based medications Pt is prone to bleeding, ok with low dose coated aspirin  . Pravastatin     Other reaction(s): Other Other reaction(s): Other (See Comments) elevated LFT'S, tolerates 1/2 dose of zetia and 1/2 dose of pravastatin  . Flagyl [Metronidazole]     headaches  . Nsaids Other (See Comments)    Pt is prone to bleeding  . Pravastatin Sodium     Other reaction(s): Other (See Comments) elevated LFT'S, tolerates 1/2 dose of zetia and 1/2 dose of pravastatin  . Statins Other (See Comments)    elevated LFT'S, tolerates 1/2 dose of zetia and 1/2 dose of pravastatin    Diagnostic Studies/Procedures    LHC: 05/30/17  Conclusion   1. Single-vessel coronary artery disease with moderately severe proximal left circumflex stenosis at the proximal edge of the previously implanted stent 2. Mild nonobstructive LAD and RCA stenoses 3. Normal LV systolic function based on noninvasive assessment 4. Successful FFR guided PCI with a 3.5 x 16 mm Promus DES in the proximal circumflex/first OM  Dual antiplatelet therapy with aspirin and Plavix for a minimum of 6 months  _____________   History of Present Illness     Kristine [name] is a 76 y.o. female with hx of CAD status post lateral STEMI 05/2013 treated with a DES to the OM1, HTN, HL. EF was 35-40% at the time of her cardiac catheterization. Follow-up echocardiogram demonstrated normal LV function with an EF of 50-55%. She had residual disease on her LHC including an occluded small  diagonal that filled by L-L collaterals.She had a nuclear stress test in 2/16 for recurrent chest pain.The study was called intermediate risk b/c of a drop in BP during exercise that was thought to possibly be technical error (initial BP in recovery was higher). There was diaphragmatic attenuation but no ischemia. This was reviewed this with Dr. [name] and was felt that her study was low risk and recommended continued medical therapy. She was last seen by Dr. [name] in 03/2016. She called in recently with fatigue and elevated blood pressure. She ended up going to the ED but left after getting an ECG.  Last seen 04/28/17 for fatigue, with stable labs and DOE.  She did have fatigue prior to previous MI.  Stress echo was done and revealed. The stress apical 2 images is slightly rotated but shows hypokinesis in the apical inferior (inferior septal) region.  Suggested cardiac cath per reader Dr. Acie Fredrickson.    At her office visit on 05/26/17 she was still c/o fatigue, this was how she felt prior to MI. The option of cardiac cath was discussed with the patient, and she was planned for outpatient cath.   Hospital Course     Presented and underwent LHC with Dr. Burt Knack on 05/30/17 noted above with successful FFR guided PCI with a 3.5 x 16 mm Promus DES in the proximal circumflex/first OM. Plan for DAPT with ASA/plavix for at least 6 months. No complications noted post cath. Cath site was stable. Received  her Rx for plavix prior to discharge. Follow up has been arranged in the office. Medications are listed below.   _____________  Discharge Vitals Blood pressure (!) 130/97, pulse 65, temperature 98.6 F (37 C), temperature source Oral, resp. rate 18, height 5\' 1"  (1.549 m), weight 133 lb (60.3 kg), last menstrual period 11/25/1994, SpO2 99 %.  Filed Weights   05/30/17 1050  Weight: 133 lb (60.3 kg)    Labs & Radiologic Studies    CBC No results for input(s): WBC, NEUTROABS, HGB, HCT, MCV, PLT  in the last 72 hours. Basic Metabolic Panel No results for input(s): NA, K, CL, CO2, GLUCOSE, BUN, CREATININE, CALCIUM, MG, PHOS in the last 72 hours. Liver Function Tests No results for input(s): AST, ALT, ALKPHOS, BILITOT, PROT, ALBUMIN in the last 72 hours. No results for input(s): LIPASE, AMYLASE in the last 72 hours. Cardiac Enzymes No results for input(s): CKTOTAL, CKMB, CKMBINDEX, TROPONINI in the last 72 hours. BNP Invalid input(s): POCBNP D-Dimer No results for input(s): DDIMER in the last 72 hours. Hemoglobin A1C No results for input(s): HGBA1C in the last 72 hours. Fasting Lipid Panel No results for input(s): CHOL, HDL, LDLCALC, TRIG, CHOLHDL, LDLDIRECT in the last 72 hours. Thyroid Function Tests No results for input(s): TSH, T4TOTAL, T3FREE, THYROIDAB in the last 72 hours.  Invalid input(s): FREET3 _____________  No results found. Disposition   Pt is being discharged home today in good condition.  Follow-up Plans & Appointments    Follow-up Information    [place], Crista Luria, [place] Follow up on 06/11/2017.   Specialty:  Cardiology Why:  at 8:30am for your follow up appt.  Contact information: [address] [address] [phone]            Discharge Medications   Current Discharge Medication List    START taking these medications   Details  clopidogrel (PLAVIX) 75 MG tablet Take 1 tablet (75 mg total) by mouth daily. Qty: 90 tablet, Refills: 1      CONTINUE these medications which have NOT CHANGED   Details  acetaminophen (TYLENOL) 500 MG tablet Take 1,000 mg by mouth every 6 (six) hours as needed for headache.    ALPRAZolam (XANAX) 0.25 MG tablet Take 1 tablet (0.25 mg total) by mouth daily as needed for anxiety. Qty: 30 tablet, Refills: 0    aspirin EC 81 MG EC tablet Take 1 tablet (81 mg total) by mouth daily.    ezetimibe (ZETIA) 10 MG tablet TAKE (1/2) TABLET BY MOUTH DAILY Qty: 15 tablet, Refills: 1    losartan  (COZAAR) 50 MG tablet TAKE 1/2 TABLET BY MOUTH ONCE DAILY Qty: 15 tablet, Refills: 6   Associated Diagnoses: Essential hypertension, benign; Essential hypertension    Multiple Vitamin (MULTIVITAMIN WITH MINERALS) TABS tablet Take 1 tablet by mouth daily.    pravastatin (PRAVACHOL) 10 MG tablet Take 1 tablet (10 mg total) by mouth every evening. Qty: 30 tablet, Refills: 5    ranitidine (ZANTAC) 150 MG tablet Take 150 mg by mouth at bedtime.    nitroGLYCERIN (NITROSTAT) 0.4 MG SL tablet Place 1 tablet (0.4 mg total) under the tongue every 5 (five) minutes x 3 doses as needed for chest pain. Qty: 25 tablet, Refills: 12   Associated Diagnoses: Coronary artery disease involving native coronary artery of native heart without angina pectoris      STOP taking these medications     omeprazole (PRILOSEC) 40 MG capsule  Aspirin prescribed at discharge?  Yes High Intensity Statin Prescribed? (Lipitor 40-80mg  or Crestor 20-40mg ): No: Unable to tolerate full dose 2/2 elevated LFTs Beta Blocker Prescribed? No: Consider at follow up appt For EF <40%, was ACEI/ARB Prescribed? Yes ADP Receptor Inhibitor Prescribed? (i.e. Plavix etc.-Includes Medically Managed Patients): Yes For EF <40%, Aldosterone Inhibitor Prescribed? No: EF ok Was EF assessed during THIS hospitalization? Yes Was Cardiac Rehab II ordered? (Included Medically managed Patients): Yes   Outstanding Labs/Studies   N/A  Duration of Discharge Encounter   Greater than 30 minutes including physician time.  Signed, Reino Bellis NP-C 05/30/2017, 3:12 PM

## 2017-05-30 NOTE — Progress Notes (Addendum)
Pts TRB is deflating per protocol without incident. (see flowsheet)  Cardiac rehab has seen pt and completed her teaching.  Pt has her one month supply of plavix and understands how to tack it.  Pt has her angioplasty booklet and has watched the cardiac cath/PCI video. Pt has her follow up appointment scheduled for July 18  Vickie Epley will come and assess pt before discharge. Report given to Irving Burton

## 2017-05-30 NOTE — H&P (View-Only) (Signed)
Cardiology Office Note   Date:  05/26/2017   ID:  Kristine [name], DOB Jul 24, 1941, MRN [id number]  PCP:  Wardell Honour, MD  Cardiologist:  Dr. Burt Knack    Chief Complaint  Patient presents with  . Fatigue      History of Present Illness: Kristine [name] is a 76 y.o. female who presents for discussion of cardiac cath with abnormal Echo stress test.  hx of CAD status post lateral STEMI 05/2013 treated with a DES to the OM1, HTN, HL. EF was 35-40% at the time of her cardiac catheterization. Follow-up echocardiogram demonstrated normal LV function with an EF of 50-55%. She had residual disease on her LHC including an occluded small diagonal that filled by L-L collaterals. She had a nuclear stress test in 2/16 for recurrent chest pain. The study was called intermediate risk b/c of a drop in BP during exercise that was thought to possibly be technical error (initial BP in recovery was higher). There was diaphragmatic attenuation but no ischemia. I reviewed this with Dr. [name] and we felt her study was low risk and recommended continued medical therapy.  She was last seen by Dr. [name] in 03/2016.  She called in recently with fatigue and elevated blood pressure. She ended up going to the ED but left after getting an ECG.  Last seen 04/28/17 for fatigue, with stable labs and DOE.  She did have fatigue prior to previous MI.  Stress echo was done and revealed.   The stress apical 2 images is slightly rotated but shows hypokinesis in the apical inferior ( inferior septal ) region  Suggest cardiac cath per reader Dr. Acie Fredrickson.    Today she is still with fatigue, this is how she felt prior to MI.  We discussed cardiac cath in detail.  She would like to proceed and with Dr. Burt Knack planned for Friday.  She has no bleeding, no syncope and no palpitations.  Past Medical History:  Diagnosis Date  . Anemia   . Arthritis   . Barrett's esophagus    Stage B  . CAD (coronary artery disease)    a. s/p  STEMI 7/14 >> LHC: LM 20, mLAD 30-40, small Dx 100 (L-L collats), OM1 90, OM2 30-40, mRCA 50-60, Lat/AL akinesis, EF 35-40% >> PCI: Promus DES to OM1;  b. Myoview 3/16: diaph atten, no ischemia, EF 59% (intermediate risk b/c BP drop 152>>137, but recovery BP 162 (tech error) >> med Rx continued   . CREST variant of scleroderma ([place])   . Cystocele   . Diverticulitis   . GERD (gastroesophageal reflux disease)   . History of blood transfusion    hx of being Anti-K positive  . History of Doppler ultrasound    a. Carotid US 2/12: bilateral ICA < 50%  . Hyperlipidemia   . Hypertension   . Ischemic cardiomyopathy    a. EF 35-40% by LHC at time of MI;  b. Echo 7/14:  Mild focal basal septal hypertrophy, EF 50-55%, apical HK, grade 1 diastolic dysfunction, trivial AI   . Osteopenia 09/2012   T score -2.4 FRAX 13%/2.7%  per 10/15/2012 discussion plan repeat in 2 years  . Raynaud disease   . Rectocele    mild    Past Surgical History:  Procedure Laterality Date  . ABDOMINAL HYSTERECTOMY  1996   RSO for menorrhagia.  . CORONARY ANGIOPLASTY WITH STENT PLACEMENT  06/21/2013  . FOOT SURGERY  2013   x2  . JOINT REPLACEMENT  9/10   rt total  . nissen fundoplication  5009  . TONSILLECTOMY  1950  . TOTAL KNEE ARTHROPLASTY     right  . TUBAL LIGATION  1980     Current Outpatient Prescriptions  Medication Sig Dispense Refill  . ALPRAZolam (XANAX) 0.25 MG tablet Take 1 tablet (0.25 mg total) by mouth daily as needed for anxiety. 30 tablet 0  . aspirin EC 81 MG EC tablet Take 1 tablet (81 mg total) by mouth daily.    Marland Kitchen ezetimibe (ZETIA) 10 MG tablet TAKE (1/2) TABLET BY MOUTH DAILY 15 tablet 1  . losartan (COZAAR) 50 MG tablet TAKE 1/2 TABLET BY MOUTH ONCE DAILY 15 tablet 6  . Multiple Vitamin (MULTIVITAMIN WITH MINERALS) TABS tablet Take 1 tablet by mouth daily.    . nitroGLYCERIN (NITROSTAT) 0.4 MG SL tablet Place 1 tablet (0.4 mg total) under the tongue every 5 (five) minutes x 3 doses as  needed for chest pain. 25 tablet 12  . omeprazole (PRILOSEC) 40 MG capsule Take 40 mg by mouth daily.     . pravastatin (PRAVACHOL) 10 MG tablet TAKE 1 TABLET (10 MG TOTAL) BY MOUTH EVERY EVENING. 90 tablet 2  . ranitidine (ZANTAC) 150 MG tablet Take 150 mg by mouth at bedtime.     No current facility-administered medications for this visit.     Allergies:   Aspirin; Pravastatin; Flagyl [metronidazole]; Nsaids; Pravastatin sodium; and Statins    Social History:  The patient  reports that she has never smoked. She has never used smokeless tobacco. She reports that she drinks alcohol. She reports that she does not use drugs.   Family History:  The patient's family history includes Asthma in her maternal grandmother; Cancer in her maternal grandfather and mother; Emphysema in her maternal grandmother; Heart attack in her father; Heart disease in her father; Hepatitis C in her mother; Panic disorder in her daughter.    ROS:  General:no colds or fevers, no weight changes, increased fatigue Skin:no rashes or ulcers HEENT:no blurred vision, no congestion CV:see HPI PUL:see HPI GI:no diarrhea constipation or melena, no indigestion GU:no hematuria, no dysuria MS:no joint pain, no claudication Neuro:no syncope, no lightheadedness Endo:no diabetes, no thyroid disease  Wt Readings from Last 3 Encounters:  05/26/17 134 lb (60.8 kg)  04/28/17 133 lb 12.8 oz (60.7 kg)  08/08/16 133 lb (60.3 kg)     PHYSICAL EXAM: VS:  BP 132/80   Pulse 74   Ht 5\' 1"  (1.549 m)   Wt 134 lb (60.8 kg)   LMP 11/25/1994   BMI 25.32 kg/m  , BMI Body mass index is 25.32 kg/m. General:Pleasant affect, NAD Skin:Warm and dry, brisk capillary refill HEENT:normocephalic, sclera clear, mucus membranes moist Neck:supple, no JVD, no bruits  Heart:S1S2 RRR without murmur, gallup, rub or click Lungs:clear without rales, rhonchi, or wheezes FGH:WEXH, non tender, + BS, do not palpate liver spleen or masses Ext:no lower  ext edema, 2+ pedal pulses, 2+ radial pulses Neuro:alert and oriented x 3, MAE, follows commands, + facial symmetry    EKG:  EKG is not ordered today.    Recent Labs: 04/25/2017: BUN 16; Creatinine, Ser 0.69; Hemoglobin 11.8; Platelets 166; Potassium 4.7; Sodium 140 04/28/2017: ALT 24; TSH 2.990    Lipid Panel    Component Value Date/Time   CHOL 148 04/28/2017 1003   CHOL 153 07/29/2014 0926   TRIG 126 04/28/2017 1003   TRIG 112 07/29/2014 0926   HDL 45 04/28/2017 1003   HDL  49 07/29/2014 0926   CHOLHDL 3.3 04/28/2017 1003   CHOLHDL 2.6 11/23/2015 0912   VLDL 17 11/23/2015 0912   LDLCALC 78 04/28/2017 1003   LDLCALC 82 07/29/2014 0926       Other studies Reviewed: Additional studies/ records that were reviewed today include:  Echo Impressions:  - Abnormal stress echo.   She has normal LV function at rest.   The stress apical 2 images is slightly rotated but shows   hypokinesis in the apical inferior ( inferior septal ) region   Suggest cardiac cath  Recommendations:  Suggest Cardiac cath   Last nuc 2016 no ischemia, did have drop in BP.  Echo 05/2013 Study Conclusions  - Left ventricle: The cavity size was normal. There was mild focal basal hypertrophy of the septum. Systolic function was normal. The estimated ejection fraction was in the range of 50% to 55%. There is mild hypokinesis of the apical myocardium. Doppler parameters are consistent with abnormal left ventricular relaxation (grade 1 diastolic dysfunction). - Aortic valve: Trivial regurgitation. -------------------------------------------------------------------  Cardiac Cath/PCI 05/2013 LM:20% LAD:Mid-vessel with 30-40% stenosis. Small diagonal occluded fills from left-left collaterals with late filling. LCx:1st OM with 90% stenosis, 2nd Om with mild diffuse 30-40% stenosis  RCA:Mid-vessel with 50-60% stenosis. Large lateral wall/anterolateral AK, EF 35-40%. PCI  Data:Proximal OM Stent 2.75x16 mm Promus  Carotid US 12/2010 Bilat ICA < 50%  ASSESSMENT AND PLAN:  1.  Fatigue is anginal equivalent.  Now with abnormal stress echo with lateral ischemia.  Plan for cardiac cath with Dr. Burt Knack on Friday, he is aware.  Reviewed procedure and risks with pt and how long she may stay in the hospital. ( was also seen in ER 04/25/17 with this complaint)  TSH normal.   The patient understands that risks included but are not limited to stroke (1 in 1000), death (1 in 1000), kidney failure [usually temporary] (1 in 500), bleeding (1 in 200), allergic reaction [possibly serious] (1 in 200).     2.   DOE with exertion.  For cath  3. CAD with hx of STEMI in 2014 , Lateral wall.  Continue asa and statin.  4.  HTN controlled.  5. HLD LDL 78 continue statin   Current medicines are reviewed with the patient today.  The patient Has no concerns regarding medicines.  The following changes have been made:  See above Labs/ tests ordered today include:see above  Disposition:   FU:  see above  Signed, Cecilie Kicks, NP  05/26/2017 2:47 PM    [facility] [place], [place], [place], [place] Phone: [phone]; Fax: [phone]

## 2017-05-30 NOTE — Progress Notes (Signed)
Kristine [name] in and ok to d/c home

## 2017-05-30 NOTE — Interval H&P Note (Signed)
Cath Lab Visit (complete for each Cath Lab visit)  Clinical Evaluation Leading to the Procedure:   ACS: No.  Non-ACS:    Anginal Classification: CCS II  Anti-ischemic medical therapy: Minimal Therapy (1 class of medications)  Non-Invasive Test Results: Intermediate-risk stress test findings: cardiac mortality 1-3%/year  Prior CABG: No previous CABG      History and Physical Interval Note:  05/30/2017 12:56 PM  Kristine [name]  has presented today for surgery, with the diagnosis of positive stress echo  The various methods of treatment have been discussed with the patient and family. After consideration of risks, benefits and other options for treatment, the patient has consented to  Procedure(s): Left Heart Cath and Coronary Angiography (N/A) as a surgical intervention .  The patient's history has been reviewed, patient examined, no change in status, stable for surgery.  I have reviewed the patient's chart and labs.  Questions were answered to the patient's satisfaction.     [name]

## 2017-05-30 NOTE — Progress Notes (Signed)
Pt was ready for discharge.  Wrist with mild bruising.  No pain.  Stable for discharge.

## 2017-06-02 ENCOUNTER — Telehealth: Payer: Self-pay | Admitting: Cardiovascular Disease

## 2017-06-02 NOTE — Telephone Encounter (Signed)
Pt asking if okay to remove bandage from right wrist cath site done 05/29/17, pt advised to go ahead and remove bandage.

## 2017-06-02 NOTE — Telephone Encounter (Signed)
New message    Pt is calling asking for a call back about her recent hospital stay.

## 2017-06-03 ENCOUNTER — Telehealth: Payer: Self-pay | Admitting: Cardiovascular Disease

## 2017-06-03 NOTE — Telephone Encounter (Signed)
New message    Pt is calling asking for a return to work note. She said she plans on returning to work next week.

## 2017-06-04 NOTE — Telephone Encounter (Signed)
Left message on machine for pt to contact the office in regard to RTW note.  Per Dr Burt Knack the pt is able to RTW with no restrictions.

## 2017-06-04 NOTE — Telephone Encounter (Signed)
Follow UP   Pt needs a note to go back to work. She has an appt on the 18th and wants to know if it should be before or after this appt. She would like this note mailed to [address], [place] [address]

## 2017-06-04 NOTE — Telephone Encounter (Signed)
New Message ° ° pt verbalized that she is returning call for rn °

## 2017-06-04 NOTE — Telephone Encounter (Signed)
I spoke with the pt and she continues to have aching in her wrist since her procedure and this is causing her to not be able to rest at night.  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  At this time the pt will be assessed at 06/11/17 office visit and can provided with return to work note with no restrictions if appropriate.  The pt does have bruising at site but denies swelling or bleeding from site.

## 2017-06-04 NOTE — Telephone Encounter (Signed)
Left message on machine for pt to contact the office.  Please contact me in clinic when the pt calls back. I need to speak with her directly.

## 2017-06-09 ENCOUNTER — Telehealth: Payer: Self-pay | Admitting: Cardiovascular Disease

## 2017-06-09 ENCOUNTER — Other Ambulatory Visit: Payer: Self-pay | Admitting: Cardiovascular Disease

## 2017-06-09 NOTE — Telephone Encounter (Deleted)
Error

## 2017-06-10 ENCOUNTER — Encounter: Payer: Self-pay | Admitting: *Deleted

## 2017-06-10 NOTE — Progress Notes (Signed)
Cardiology Office Note    Date:  06/11/2017   ID:  Kristine [name], DOB 1940-12-05, MRN [id number]  PCP:  Wardell Honour, MD  Cardiologist: Dr. Burt Knack  Chief Complaint: Hospital follow up s/p cath   History of Present Illness:   Kristine [name] is a 76 y.o. female CAD, HTN, HL presents for follow up.   CAD status post lateral STEMI 05/2013 treated with a DES to the OM1, HTN, HL. EF was 35-40% at the time of her cardiac catheterization. Follow-up echocardiogram demonstrated normal LV function with an EF of 50-55%. She had residual disease on her LHC including an occluded small diagonal that filled by L-L collaterals.She had a nuclear stress test in 2/16 for recurrent chest pain.The study was called intermediate risk b/c of a drop in BP during exercise that was thought to possibly be technical error (initial BP in recovery was higher). There was diaphragmatic attenuation but no ischemia. This was reviewed this with Dr. [name] and was felt that her study was low risk and recommended continued medical therapy.  Recent stress echo 04/2017 for chest pain was abnormal. The stress apical 2 images is slightly rotated but showed  hypokinesis in the apical inferior ( inferior septal ) region. Follow up outpatient cath 05/30/2017 showed single-vessel coronary artery disease with moderately severe proximal left circumflex stenosis at the proximal edge of the previously implanted stent s/p successful FFR guided PCI with a 3.5 x 16 mm Promus DES. Mild non obstructive CAD in LAD and RCA.   Here today for follow up. She denies chest pain, palpitation, orthopnea, PND, syncope, lower extremity edema, dizziness, melena, blood in her stool or urine, cough, congestion, fever or chills. However, she is fatigued and tired all the times. This been ongoing prior to her stress echocardiogram. No improvement on her symptoms despite stent placement. She is also dealing with ischemic colitis and started on Xifaxan few weeks  ago.   Past Medical History:  Diagnosis Date  . Anemia   . Arthritis   . Barrett's esophagus    Stage B  . CAD (coronary artery disease)    a. s/p STEMI 7/14 >> LHC: LM 20, mLAD 30-40, small Dx 100 (L-L collats), OM1 90, OM2 30-40, mRCA 50-60, Lat/AL akinesis, EF 35-40% >> PCI: Promus DES to OM1;  b. Myoview 3/16: diaph atten, no ischemia, EF 59% (intermediate risk b/c BP drop 152>>137, but recovery BP 162 (tech error) >> med Rx continued  c. cath 05/30/17 s/p FFR guided PCI with DES to circumflex/first OM   . CREST variant of scleroderma ([place])   . Cystocele   . Diverticulitis   . GERD (gastroesophageal reflux disease)   . History of blood transfusion    hx of being Anti-K positive  . History of Doppler ultrasound    a. Carotid US 2/12: bilateral ICA < 50%  . Hyperlipidemia   . Hypertension   . Ischemic cardiomyopathy    a. EF 35-40% by LHC at time of MI;  b. Echo 7/14:  Mild focal basal septal hypertrophy, EF 50-55%, apical HK, grade 1 diastolic dysfunction, trivial AI   . Osteopenia 09/2012   T score -2.4 FRAX 13%/2.7%  per 10/15/2012 discussion plan repeat in 2 years  . Raynaud disease   . Rectocele    mild    Past Surgical History:  Procedure Laterality Date  . ABDOMINAL HYSTERECTOMY  1996   RSO for menorrhagia.  . CORONARY ANGIOPLASTY WITH STENT PLACEMENT  06/21/2013  .  CORONARY STENT INTERVENTION N/A 05/30/2017   Procedure: Coronary Stent Intervention;  Surgeon: [name], MD;  Location: [facility];  Service: Cardiovascular;  Laterality: N/A;  . FOOT SURGERY  2013   x2  . INTRAVASCULAR PRESSURE WIRE/FFR STUDY N/A 05/30/2017   Procedure: Intravascular Pressure Wire/FFR Study;  Surgeon: [name], MD;  Location: [facility];  Service: Cardiovascular;  Laterality: N/A;  . JOINT REPLACEMENT  9/10   rt total  . LEFT HEART CATH AND CORONARY ANGIOGRAPHY N/A 05/30/2017   Procedure: Left Heart Cath and Coronary Angiography;  Surgeon: [name], MD;   Location: [facility];  Service: Cardiovascular;  Laterality: N/A;  . nissen fundoplication  7425  . TONSILLECTOMY  1950  . TOTAL KNEE ARTHROPLASTY     right  . TUBAL LIGATION  1980    Current Medications: Prior to Admission medications   Medication Sig Start Date End Date Taking? Authorizing Provider  acetaminophen (TYLENOL) 500 MG tablet Take 1,000 mg by mouth every 6 (six) hours as needed for headache.    [provider]  ALPRAZolam Duanne Moron) 0.25 MG tablet Take 1 tablet (0.25 mg total) by mouth daily as needed for anxiety. 08/15/16   Darlyne Russian, MD  aspirin EC 81 MG EC tablet Take 1 tablet (81 mg total) by mouth daily. 06/24/13   Barrett, Evelene Croon, PA-C  clopidogrel (PLAVIX) 75 MG tablet Take 1 tablet (75 mg total) by mouth daily. 05/30/17   Cheryln Manly, NP  ezetimibe (ZETIA) 10 MG tablet TAKE ONE-HALF TABLET BY MOUTH DAILY 06/09/17   Isaiah Serge, NP  losartan (COZAAR) 50 MG tablet TAKE 1/2 TABLET BY MOUTH ONCE DAILY 05/26/17   [name], MD  Multiple Vitamin (MULTIVITAMIN WITH MINERALS) TABS tablet Take 1 tablet by mouth daily.    [provider]  nitroGLYCERIN (NITROSTAT) 0.4 MG SL tablet Place 1 tablet (0.4 mg total) under the tongue every 5 (five) minutes x 3 doses as needed for chest pain. 11/14/15   Richardson Dopp T, PA-C  pravastatin (PRAVACHOL) 10 MG tablet Take 1 tablet (10 mg total) by mouth every evening. 05/30/17   [name], MD  ranitidine (ZANTAC) 150 MG tablet Take 150 mg by mouth at bedtime.    [provider]    Allergies:   Aspirin; Pravastatin; Flagyl [metronidazole]; Nsaids; Pravastatin sodium; and Statins   Social History   Social History  . Marital status: Divorced    Spouse name: N/A  . Number of children: N/A  . Years of education: N/A   Social History Main Topics  . Smoking status: Never Smoker  . Smokeless tobacco: Never Used  . Alcohol use 0.0 oz/week     Comment: Occasional  . Drug use: No  .  Sexual activity: No   Other Topics Concern  . None   Social History Narrative   Exercise dancing 3 x times weekly for 1-2 hours     Family History:  The patient's family history includes Asthma in her maternal grandmother; Cancer in her maternal grandfather and mother; Emphysema in her maternal grandmother; Heart attack in her father; Heart disease in her father; Hepatitis C in her mother; Panic disorder in her daughter.   ROS:   Please see the history of present illness.    ROS All other systems reviewed and are negative.   PHYSICAL EXAM:   VS:  BP 130/70   Pulse 76   Ht 5\' 1"  (1.549 m)   Wt  130 lb 12.8 oz (59.3 kg)   LMP 11/25/1994   BMI 24.71 kg/m    GEN: Well nourished, well developed, in no acute distress  HEENT: normal  Neck: no JVD, carotid bruits, or masses Cardiac: RRR; no murmurs, rubs, or gallops,no edema. Right radial Site without hematoma or bruit. Mild ecchymosis. Non TTP.  Respiratory:  clear to auscultation bilaterally, normal work of breathing GI: soft, nontender, nondistended, + BS MS: no deformity or atrophy  Skin: warm and dry, no rash Neuro:  Alert and Oriented x 3, Strength and sensation are intact Psych: euthymic mood, full affect  Wt Readings from Last 3 Encounters:  06/11/17 130 lb 12.8 oz (59.3 kg)  05/30/17 133 lb (60.3 kg)  05/26/17 134 lb (60.8 kg)      Studies/Labs Reviewed:   EKG:  EKG is ordered today.  The ekg ordered today demonstrates NSR at rate of 76 bpm.   Recent Labs: 04/28/2017: ALT 24; TSH 2.990 05/26/2017: BUN 13; Creatinine, Ser 0.79; Hemoglobin 12.2; Platelets 213; Potassium 4.6; Sodium 143   Lipid Panel    Component Value Date/Time   CHOL 148 04/28/2017 1003   CHOL 153 07/29/2014 0926   TRIG 126 04/28/2017 1003   TRIG 112 07/29/2014 0926   HDL 45 04/28/2017 1003   HDL 49 07/29/2014 0926   CHOLHDL 3.3 04/28/2017 1003   CHOLHDL 2.6 11/23/2015 0912   VLDL 17 11/23/2015 0912   LDLCALC 78 04/28/2017 1003   LDLCALC 82  07/29/2014 0926    Additional studies/ records that were reviewed today include:   Stress echo 05/20/17 Impressions:  - Abnormal stress echo.   She has normal LV function at rest.   The stress apical 2 images is slightly rotated but shows   hypokinesis in the apical inferior ( inferior septal ) region   Suggest cardiac cath  Recommendations:  Suggest Cardiac cath  Coronary Stent Intervention  Intravascular Pressure Wire/FFR Study  Left Heart Cath and Coronary Angiography  Conclusion   1. Single-vessel coronary artery disease with moderately severe proximal left circumflex stenosis at the proximal edge of the previously implanted stent 2. Mild nonobstructive LAD and RCA stenoses 3. Normal LV systolic function based on noninvasive assessment 4. Successful FFR guided PCI with a 3.5 x 16 mm Promus DES in the proximal circumflex/first OM  Dual antiplatelet therapy with aspirin and Plavix for a minimum of 6 months   Diagnostic Diagram       Post-Intervention Diagram           ASSESSMENT & PLAN:    1. CAD s/p DES to OM 1 in 2017 & FFR guided DES to circumflex/first OM in 05/2017  (proximal to previously placed stent) - Continue ASA and plavix for minimum of 6 months (see below dental work up after this) . No angina or dyspnea. Reassuring EKG today. Cath site looks good except mild ecchymosis. This been improved significantly.   2. HTN - Stable and well controlled on current regimen.  3. HLD - 04/28/2017: Cholesterol, Total 148; HDL 45; LDL Calculated 78; Triglycerides 126  - Continue statin   4. Generalized weakness - She has no cardiac symptoms including chest pain, shortness of breath, palpitation, dizziness, orthopnea, PND or syncope. - No improvement on her symptoms despite stent placement. Recently placed on Xifaxan for ischemic colitis. Offered CBC and BMET today however she will follow up with PCP for complete workup. I agreed, she need non cardiac evaluation of  her symptoms.  - She can  return to work without restriction. She was out of work 7/6-7/18 for cath. Per employment policy she will need FMLA work up as she was out for > 3 days.     Medication Adjustments/Labs and Tests Ordered: Current medicines are reviewed at length with the patient today.  Concerns regarding medicines are outlined above.  Medication changes, Labs and Tests ordered today are listed in the Patient Instructions below. There are no Patient Instructions on file for this visit.   Jarrett Soho, [place]  06/11/2017 8:58 AM    [facility] [place], [place], [place]  [address] Phone: [phone]; Fax: [phone]

## 2017-06-11 ENCOUNTER — Encounter: Payer: Self-pay | Admitting: Physician Assistant

## 2017-06-11 ENCOUNTER — Encounter: Payer: Self-pay | Admitting: Family Medicine

## 2017-06-11 ENCOUNTER — Encounter: Payer: Self-pay | Admitting: *Deleted

## 2017-06-11 ENCOUNTER — Ambulatory Visit (INDEPENDENT_AMBULATORY_CARE_PROVIDER_SITE_OTHER): Payer: BLUE CROSS/BLUE SHIELD | Admitting: Physician Assistant

## 2017-06-11 ENCOUNTER — Ambulatory Visit (INDEPENDENT_AMBULATORY_CARE_PROVIDER_SITE_OTHER): Payer: BLUE CROSS/BLUE SHIELD | Admitting: Family Medicine

## 2017-06-11 ENCOUNTER — Other Ambulatory Visit: Payer: Self-pay | Admitting: *Deleted

## 2017-06-11 VITALS — BP 122/68 | HR 78 | Temp 98.0°F | Resp 16 | Ht 61.0 in | Wt 129.6 lb

## 2017-06-11 VITALS — BP 130/70 | HR 76 | Ht 61.0 in | Wt 130.8 lb

## 2017-06-11 DIAGNOSIS — R5382 Chronic fatigue, unspecified: Secondary | ICD-10-CM

## 2017-06-11 DIAGNOSIS — I2510 Atherosclerotic heart disease of native coronary artery without angina pectoris: Secondary | ICD-10-CM

## 2017-06-11 DIAGNOSIS — R531 Weakness: Secondary | ICD-10-CM | POA: Diagnosis not present

## 2017-06-11 DIAGNOSIS — E782 Mixed hyperlipidemia: Secondary | ICD-10-CM | POA: Diagnosis not present

## 2017-06-11 DIAGNOSIS — I10 Essential (primary) hypertension: Secondary | ICD-10-CM

## 2017-06-11 DIAGNOSIS — Z862 Personal history of diseases of the blood and blood-forming organs and certain disorders involving the immune mechanism: Secondary | ICD-10-CM | POA: Diagnosis not present

## 2017-06-11 DIAGNOSIS — G4733 Obstructive sleep apnea (adult) (pediatric): Secondary | ICD-10-CM | POA: Diagnosis not present

## 2017-06-11 LAB — POCT URINALYSIS DIP (MANUAL ENTRY)
BILIRUBIN UA: NEGATIVE
GLUCOSE UA: NEGATIVE mg/dL
Ketones, POC UA: NEGATIVE mg/dL
Nitrite, UA: NEGATIVE
Protein Ur, POC: NEGATIVE mg/dL
RBC UA: NEGATIVE
Spec Grav, UA: 1.01 (ref 1.010–1.025)
Urobilinogen, UA: 0.2 E.U./dL
pH, UA: 6 (ref 5.0–8.0)

## 2017-06-11 NOTE — Patient Instructions (Addendum)
     IF you received an x-ray today, you will receive an invoice from [facility]. Please contact [facility] at [phone] with questions or concerns regarding your invoice.   IF you received labwork today, you will receive an invoice from [facility]. Please contact [facility] at [phone] with questions or concerns regarding your invoice.   Our billing staff will not be able to assist you with questions regarding bills from these companies.  You will be contacted with the lab results as soon as they are available. The fastest way to get your results is to activate your My Chart account. Instructions are located on the last page of this paperwork. If you have not heard from us regarding the results in 2 weeks, please contact this office.      Fatigue Fatigue is feeling tired all of the time, a lack of energy, or a lack of motivation. Occasional or mild fatigue is often a normal response to activity or life in general. However, long-lasting (chronic) or extreme fatigue may indicate an underlying medical condition. Follow these instructions at home: Watch your fatigue for any changes. The following actions may help to lessen any discomfort you are feeling:  Talk to your health care provider about how much sleep you need each night. Try to get the required amount every night.  Take medicines only as directed by your health care provider.  Eat a healthy and nutritious diet. Ask your health care provider if you need help changing your diet.  Drink enough fluid to keep your urine clear or pale yellow.  Practice ways of relaxing, such as yoga, meditation, massage therapy, or acupuncture.  Exercise regularly.  Change situations that cause you stress. Try to keep your work and personal routine reasonable.  Do not abuse illegal drugs.  Limit alcohol intake to no more than 1 drink per day for nonpregnant women and 2 drinks per day for men. One drink equals 12 ounces of  beer, 5 ounces of wine, or 1 ounces of hard liquor.  Take a multivitamin, if directed by your health care provider.  Contact a health care provider if:  Your fatigue does not get better.  You have a fever.  You have unintentional weight loss or gain.  You have headaches.  You have difficulty: ? Falling asleep. ? Sleeping throughout the night.  You feel angry, guilty, anxious, or sad.  You are unable to have a bowel movement (constipation).  You skin is dry.  Your legs or another part of your body is swollen. Get help right away if:  You feel confused.  Your vision is blurry.  You feel faint or pass out.  You have a severe headache.  You have severe abdominal, pelvic, or back pain.  You have chest pain, shortness of breath, or an irregular or fast heartbeat.  You are unable to urinate or you urinate less than normal.  You develop abnormal bleeding, such as bleeding from the rectum, vagina, nose, lungs, or nipples.  You vomit blood.  You have thoughts about harming yourself or committing suicide.  You are worried that you might harm someone else. This information is not intended to replace advice given to you by your health care provider. Make sure you discuss any questions you have with your health care provider. Document Released: 09/08/2007 Document Revised: 04/18/2016 Document Reviewed: 03/15/2014 Elsevier Interactive Patient Education  2018 Elsevier Inc.  

## 2017-06-11 NOTE — Progress Notes (Signed)
Chief Complaint  Patient presents with  . Fatigue    onset: 2 months, per patient doesn't get much sleep, sleeps about 5 hours a night.    HPI   Chronic Fatigue OSA treated with dental device This is a patient of Dr. Tamala Julian who is here today for fatigue She reports that she has persistent fatigue for 2 months. She goes to bed tired and wakes up tired. She reports that she tosses and turns and only sleeps about 5 hours.  She has osa  She reports that she uses a dental appliance to help her sleep disorder She reports that she usually wakes up at 5:30am.  She will be retiring in 2 months. She denies depression She reports that she is very happy and was previously ballroom dancing. She feels like she has "lost her pep" Depression screen [facility] 2/9 06/11/2017 08/08/2016 06/29/2016 03/29/2016 01/17/2016  Decreased Interest 0 0 0 0 0  Down, Depressed, Hopeless 0 0 0 0 0  PHQ - 2 Score 0 0 0 0 0    CAD She was seen by Cardiology this morning Her ECG was NSR She previously has abnormal stress echo in June 2018 She underwent cardiac cath of the left heart and angio which showed CAD, she has some vessel stenosis and PCI was done. She was then started on Aspirin and plavix for duration of 6 motnhs   Anemia Pt previously had a history of anemia She was evaluated for this in 2016 Since that time her hemoglobin has been normal  She eats red meat and has a balanced diet   Past Medical History:  Diagnosis Date  . Anemia   . Arthritis   . Barrett's esophagus    Stage B  . CAD (coronary artery disease)    a. s/p STEMI 7/14 >> LHC: LM 20, mLAD 30-40, small Dx 100 (L-L collats), OM1 90, OM2 30-40, mRCA 50-60, Lat/AL akinesis, EF 35-40% >> PCI: Promus DES to OM1;  b. Myoview 3/16: diaph atten, no ischemia, EF 59% (intermediate risk b/c BP drop 152>>137, but recovery BP 162 (tech error) >> med Rx continued  c. cath 05/30/17 s/p FFR guided PCI with DES to circumflex/first OM   . CREST variant of scleroderma  ([place])   . Cystocele   . Diverticulitis   . GERD (gastroesophageal reflux disease)   . History of blood transfusion    hx of being Anti-K positive  . History of Doppler ultrasound    a. Carotid US 2/12: bilateral ICA < 50%  . Hyperlipidemia   . Hypertension   . Ischemic cardiomyopathy    a. EF 35-40% by LHC at time of MI;  b. Echo 7/14:  Mild focal basal septal hypertrophy, EF 50-55%, apical HK, grade 1 diastolic dysfunction, trivial AI   . Osteopenia 09/2012   T score -2.4 FRAX 13%/2.7%  per 10/15/2012 discussion plan repeat in 2 years  . Raynaud disease   . Rectocele    mild    Current Outpatient Prescriptions  Medication Sig Dispense Refill  . acetaminophen (TYLENOL) 500 MG tablet Take 1,000 mg by mouth every 6 (six) hours as needed for headache.    . ALPRAZolam (XANAX) 0.25 MG tablet Take 1 tablet (0.25 mg total) by mouth daily as needed for anxiety. 30 tablet 0  . aspirin EC 81 MG EC tablet Take 1 tablet (81 mg total) by mouth daily.    . clopidogrel (PLAVIX) 75 MG tablet Take 1 tablet (75 mg total) by mouth  daily. 90 tablet 1  . ezetimibe (ZETIA) 10 MG tablet TAKE ONE-HALF TABLET BY MOUTH DAILY 15 tablet 3  . losartan (COZAAR) 50 MG tablet TAKE 1/2 TABLET BY MOUTH ONCE DAILY 15 tablet 6  . Multiple Vitamin (MULTIVITAMIN WITH MINERALS) TABS tablet Take 1 tablet by mouth daily.    . nitroGLYCERIN (NITROSTAT) 0.4 MG SL tablet Place 1 tablet (0.4 mg total) under the tongue every 5 (five) minutes x 3 doses as needed for chest pain. 25 tablet 12  . pantoprazole (PROTONIX) 40 MG tablet Take 40 mg by mouth daily.    . pravastatin (PRAVACHOL) 10 MG tablet Take 1 tablet (10 mg total) by mouth every evening. 30 tablet 5  . ranitidine (ZANTAC) 150 MG tablet Take 150 mg by mouth at bedtime.    . rifaximin (XIFAXAN) 550 MG TABS tablet Take 550 mg by mouth daily.     No current facility-administered medications for this visit.     Allergies:  Allergies  Allergen Reactions  . Aspirin  Other (See Comments)    Other reaction(s): Other Can take 81 mg but not aspirin based medications Pt is prone to bleeding, ok with low dose coated aspirin  . Pravastatin     Other reaction(s): Other Other reaction(s): Other (See Comments) elevated LFT'S, tolerates 1/2 dose of zetia and 1/2 dose of pravastatin  . Flagyl [Metronidazole]     headaches  . Nsaids Other (See Comments)    Pt is prone to bleeding  . Pravastatin Sodium     Other reaction(s): Other (See Comments) elevated LFT'S, tolerates 1/2 dose of zetia and 1/2 dose of pravastatin  . Statins Other (See Comments)    elevated LFT'S, tolerates 1/2 dose of zetia and 1/2 dose of pravastatin    Past Surgical History:  Procedure Laterality Date  . ABDOMINAL HYSTERECTOMY  1996   RSO for menorrhagia.  . CORONARY ANGIOPLASTY WITH STENT PLACEMENT  06/21/2013  . CORONARY STENT INTERVENTION N/A 05/30/2017   Procedure: Coronary Stent Intervention;  Surgeon: [name], MD;  Location: [facility];  Service: Cardiovascular;  Laterality: N/A;  . FOOT SURGERY  2013   x2  . INTRAVASCULAR PRESSURE WIRE/FFR STUDY N/A 05/30/2017   Procedure: Intravascular Pressure Wire/FFR Study;  Surgeon: [name], MD;  Location: [facility];  Service: Cardiovascular;  Laterality: N/A;  . JOINT REPLACEMENT  9/10   rt total  . LEFT HEART CATH AND CORONARY ANGIOGRAPHY N/A 05/30/2017   Procedure: Left Heart Cath and Coronary Angiography;  Surgeon: [name], MD;  Location: [facility];  Service: Cardiovascular;  Laterality: N/A;  . nissen fundoplication  6283  . TONSILLECTOMY  1950  . TOTAL KNEE ARTHROPLASTY     right  . TUBAL LIGATION  1980    Social History   Social History  . Marital status: Divorced    Spouse name: N/A  . Number of children: N/A  . Years of education: N/A   Social History Main Topics  . Smoking status: Never Smoker  . Smokeless tobacco: Never Used  . Alcohol use 0.0 oz/week     Comment:  Occasional  . Drug use: No  . Sexual activity: No   Other Topics Concern  . None   Social History Narrative   Exercise dancing 3 x times weekly for 1-2 hours    ROS See hpi  Objective: Vitals:   06/11/17 1043  BP: 122/68  Pulse: 78  Resp: 16  Temp: 98 F (36.7 C)  TempSrc: Oral  SpO2: 99%  Weight: 129 lb 9.6 oz (58.8 kg)  Height: 5\' 1"  (1.549 m)    Physical Exam  Constitutional: She is oriented to person, place, and time. She appears well-developed and well-nourished.  HENT:  Head: Normocephalic and atraumatic.  Eyes: Conjunctivae and EOM are normal.  Neck: Normal range of motion. Neck supple.  Cardiovascular: Normal rate, regular rhythm and normal heart sounds.   Pulmonary/Chest: Effort normal and breath sounds normal. No respiratory distress. She has no wheezes.  Abdominal: Soft. Bowel sounds are normal. She exhibits no distension. There is no tenderness. There is no guarding.  Neurological: She is alert and oriented to person, place, and time.  Skin: Skin is warm. Capillary refill takes less than 2 seconds.  Psychiatric: She has a normal mood and affect. Her behavior is normal. Judgment and thought content normal.    Assessment and Plan Samariyah was seen today for fatigue.  Diagnoses and all orders for this visit:  Chronic fatigue- could be multifactorial  Will check for vit d, b12 and lyme in addition to asymptomatic uti -     Vitamin B12 -     VITAMIN D 25 Hydroxy (Vit-D Deficiency, Fractures) -     Lyme Ab/Western Blot Reflex -     Comprehensive metabolic panel -     CBC with Differential/Platelet -     POCT urinalysis dipstick  Coronary artery disease involving native coronary artery of native heart without angina pectoris Stable per Cardiology Continue asa and plavix  OSA (obstructive sleep apnea) Continue to wear dental device  History of anemia -  Will check diff  Cbc was normal 2 weeks ago    [employer]

## 2017-06-11 NOTE — Patient Instructions (Signed)
Medication Instructions:   Your physician recommends that you continue on your current medications as directed. Please refer to the Current Medication list given to you today.  If you need a refill on your cardiac medications before your next appointment, please call your pharmacy.  Labwork: NONE ORDERED  TODAY    Testing/Procedures: NONE ORDERED  TODAY'    Follow-Up: WITH [facility] IN 4  MONTHS    Any Other Special Instructions Will Be Listed Below (If Applicable).  PLEASE FOLLOW UP WITH PCP ABOUT FATIGUE.Marland Kitchen

## 2017-06-12 NOTE — Telephone Encounter (Signed)
New message   Pt is calling to find out about how she scheduled for rehab. Does she call or will someone call her. Please call.

## 2017-06-12 NOTE — Telephone Encounter (Signed)
Pt had a cardiac cath on 06/01/17. Pt was told prior being  D/C from the hospital  By the nutritionist that she would be referred to cardiac rehab after D/C. Pt was seen post op by Mariana Arn PA on 7/18 on PA's note does not mentioned cardiac rehab, only that pt is weak and needed for the PCP be evaluated for  Generalized weakness. Nothing about cardiac rehab referral. Pt states that she has had cardiac rehab before for a  prior heart attach. Pt  went to [facility] in [place] in the evenings. Pt  would like to be referred there, because they know her there and the rehab is closed to her work.

## 2017-06-13 ENCOUNTER — Ambulatory Visit: Payer: BLUE CROSS/BLUE SHIELD | Admitting: Physician Assistant

## 2017-06-13 ENCOUNTER — Telehealth: Payer: Self-pay | Admitting: Family Medicine

## 2017-06-13 ENCOUNTER — Telehealth: Payer: Self-pay | Admitting: Cardiovascular Disease

## 2017-06-13 LAB — COMPREHENSIVE METABOLIC PANEL
ALBUMIN: 4.4 g/dL (ref 3.5–4.8)
ALT: 16 IU/L (ref 0–32)
AST: 19 IU/L (ref 0–40)
Albumin/Globulin Ratio: 1.8 (ref 1.2–2.2)
Alkaline Phosphatase: 99 IU/L (ref 39–117)
BUN / CREAT RATIO: 17 (ref 12–28)
BUN: 13 mg/dL (ref 8–27)
Bilirubin Total: 0.3 mg/dL (ref 0.0–1.2)
CALCIUM: 9.5 mg/dL (ref 8.7–10.3)
CO2: 23 mmol/L (ref 20–29)
CREATININE: 0.76 mg/dL (ref 0.57–1.00)
Chloride: 103 mmol/L (ref 96–106)
GFR, EST AFRICAN AMERICAN: 89 mL/min/{1.73_m2} (ref >59)
GFR, EST NON AFRICAN AMERICAN: 78 mL/min/{1.73_m2} (ref >59)
GLOBULIN, TOTAL: 2.4 g/dL (ref 1.5–4.5)
Glucose: 96 mg/dL (ref 65–99)
Potassium: 4.7 mmol/L (ref 3.5–5.2)
SODIUM: 141 mmol/L (ref 134–144)
TOTAL PROTEIN: 6.8 g/dL (ref 6.0–8.5)

## 2017-06-13 LAB — CBC WITH DIFFERENTIAL/PLATELET
BASOS: 1 %
Basophils Absolute: 0 10*3/uL (ref 0.0–0.2)
EOS (ABSOLUTE): 0.2 10*3/uL (ref 0.0–0.4)
EOS: 3 %
HEMATOCRIT: 36.2 % (ref 34.0–46.6)
Hemoglobin: 11.9 g/dL (ref 11.1–15.9)
IMMATURE GRANULOCYTES: 0 %
Immature Grans (Abs): 0 10*3/uL (ref 0.0–0.1)
LYMPHS ABS: 1.7 10*3/uL (ref 0.7–3.1)
Lymphs: 27 %
MCH: 26.3 pg — ABNORMAL LOW (ref 26.6–33.0)
MCHC: 32.9 g/dL (ref 31.5–35.7)
MCV: 80 fL (ref 79–97)
MONOS ABS: 0.7 10*3/uL (ref 0.1–0.9)
Monocytes: 11 %
NEUTROS PCT: 58 %
Neutrophils Absolute: 3.7 10*3/uL (ref 1.4–7.0)
Platelets: 208 10*3/uL (ref 150–379)
RBC: 4.52 x10E6/uL (ref 3.77–5.28)
RDW: 15.4 % (ref 12.3–15.4)
WBC: 6.4 10*3/uL (ref 3.4–10.8)

## 2017-06-13 LAB — VITAMIN B12: Vitamin B-12: 984 pg/mL (ref 232–1245)

## 2017-06-13 LAB — LYME AB/WESTERN BLOT REFLEX
LYME DISEASE AB, QUANT, IGM: <0.8 index (ref 0.00–0.79)
Lyme IgG/IgM Ab: <0.91 {ISR} (ref 0.00–0.90)

## 2017-06-13 LAB — VITAMIN D 25 HYDROXY (VIT D DEFICIENCY, FRACTURES): Vit D, 25-Hydroxy: 36.8 ng/mL (ref 30.0–100.0)

## 2017-06-13 NOTE — Telephone Encounter (Signed)
Patient complaining of low BP after a long walk this afternoon. Consulted DOD, Dr. Saunders Revel, he advised for patient to hold her losartan and check her BP, and if her BP is over 130/80 to take her losartan. He also recommend patient to keep hydrated. Informed patient of advisement . Patient verbalized understanding and will call if her BP continues to be low. Will forward to Dr. Burt Knack so he is aware.

## 2017-06-13 NOTE — Telephone Encounter (Signed)
Pt c/o BP issue: STAT if pt c/o blurred vision, one-sided weakness or slurred speech  1. What are your last 5 BP readings? This morning- 110/30// 100/45//106/48  2. Are you having any other symptoms (ex. Dizziness, headache, blurred vision, passed out)? No  3. What is your BP issue? Very low

## 2017-06-13 NOTE — Telephone Encounter (Signed)
Pt was advised to stop taking rx- losartan for low BP

## 2017-06-13 NOTE — Progress Notes (Deleted)
    06/13/2017 2:25 PM   DOB: [birth year] / MRN: [id number]  SUBJECTIVE:  Kristine [name] is a 76 y.o. female presenting for low blood pressure.    She is allergic to aspirin; pravastatin; flagyl [metronidazole]; nsaids; pravastatin sodium; and statins.   She  has a past medical history of Anemia; Arthritis; Barrett's esophagus; CAD (coronary artery disease); CREST variant of scleroderma ([place]); Cystocele; Diverticulitis; GERD (gastroesophageal reflux disease); History of blood transfusion; History of Doppler ultrasound; Hyperlipidemia; Hypertension; Ischemic cardiomyopathy; Osteopenia (09/2012); Raynaud disease; and Rectocele.    She  reports that she has never smoked. She has never used smokeless tobacco. She reports that she drinks alcohol. She reports that she does not use drugs. She  reports that she does not engage in sexual activity. The patient  has a past surgical history that includes Tonsillectomy (1950); Tubal ligation (6010); nissen fundoplication (9323); Total knee arthroplasty; Joint replacement (9/10); Foot surgery (2013); Abdominal hysterectomy (1996); Coronary angioplasty with stent (06/21/2013); Left Heart Cath and Coronary Angiography (N/A, 05/30/2017); Intravascular Pressure Wire/FFR Study (N/A, 05/30/2017); and Coronary Stent Intervention (N/A, 05/30/2017).  Her family history includes Asthma in her maternal grandmother; Cancer in her maternal grandfather and mother; Emphysema in her maternal grandmother; Heart attack in her father; Heart disease in her father; Hepatitis C in her mother; Panic disorder in her daughter.  ROS  The problem list and medications were reviewed and updated by myself where necessary and exist elsewhere in the encounter.   OBJECTIVE:  LMP 11/25/1994   Physical Exam  Results for orders placed or performed in visit on 06/11/17 (from the past 72 hour(s))  POCT urinalysis dipstick     Status: Abnormal   Collection Time: 06/11/17  4:29 PM  Result Value Ref Range     Color, UA yellow yellow   Clarity, UA cloudy (A) clear   Glucose, UA negative negative mg/dL   Bilirubin, UA negative negative   Ketones, POC UA negative negative mg/dL   Spec Grav, UA 1.010 1.010 - 1.025   Blood, UA negative negative   pH, UA 6.0 5.0 - 8.0   Protein Ur, POC negative negative mg/dL   Urobilinogen, UA 0.2 0.2 or 1.0 E.U./dL   Nitrite, UA Negative Negative   Leukocytes, UA Moderate (2+) (A) Negative    No results found.  ASSESSMENT AND PLAN:  There are no diagnoses linked to this encounter.  The patient is advised to call or return to clinic if she does not see an improvement in symptoms, or to seek the care of the closest emergency department if she worsens with the above plan.   Philis Fendt, MHS, PA-C [facility] 06/13/2017 2:25 PM

## 2017-06-16 ENCOUNTER — Encounter: Payer: Self-pay | Admitting: Family Medicine

## 2017-06-16 ENCOUNTER — Ambulatory Visit (INDEPENDENT_AMBULATORY_CARE_PROVIDER_SITE_OTHER): Payer: BLUE CROSS/BLUE SHIELD | Admitting: Family Medicine

## 2017-06-16 VITALS — BP 126/70 | HR 98 | Temp 99.0°F | Resp 18 | Ht 60.63 in | Wt 131.0 lb

## 2017-06-16 DIAGNOSIS — R5383 Other fatigue: Secondary | ICD-10-CM

## 2017-06-16 DIAGNOSIS — I959 Hypotension, unspecified: Secondary | ICD-10-CM

## 2017-06-16 DIAGNOSIS — N390 Urinary tract infection, site not specified: Secondary | ICD-10-CM

## 2017-06-16 DIAGNOSIS — R8281 Pyuria: Secondary | ICD-10-CM

## 2017-06-16 DIAGNOSIS — I2510 Atherosclerotic heart disease of native coronary artery without angina pectoris: Secondary | ICD-10-CM

## 2017-06-16 NOTE — Patient Instructions (Addendum)
  Blood pressure appears to be creeping back up off medicine, but only in the morning and only the upper number. If pressure remains over 140, would recommend talking to cardiology to decide if you need to restart the 1/2 pill of losartan.   You did have some infection fighting cells in your urine testing at last visit, but that is not specific for infection. Return for urine testing in the morning and I will order a culture.  If any new urinary symptoms, abdominal pain, fever or other new symptoms - please return to discuss further.   Return to the clinic or go to the nearest emergency room if any of your symptoms worsen or new symptoms occur.    IF you received an x-ray today, you will receive an invoice from [facility]. Please contact [facility] at [phone] with questions or concerns regarding your invoice.   IF you received labwork today, you will receive an invoice from [facility]. Please contact [facility] at [phone] with questions or concerns regarding your invoice.   Our billing staff will not be able to assist you with questions regarding bills from these companies.  You will be contacted with the lab results as soon as they are available. The fastest way to get your results is to activate your My Chart account. Instructions are located on the last page of this paperwork. If you have not heard from [place] regarding the results in 2 weeks, please contact this office.

## 2017-06-16 NOTE — Telephone Encounter (Signed)
Thanks. Will refer for phase 2 cardiac rehab.

## 2017-06-16 NOTE — Telephone Encounter (Signed)
Probably best to continue to hold losartan. Would only resume if SBP running > 140 consistently. thanks

## 2017-06-16 NOTE — Progress Notes (Addendum)
Subjective:  By signing my name below, I, Kristine [name], attest that this documentation has been prepared under the direction and in the presence of Wendie Agreste, MD Electronically Signed: Ladene Artist, ED Scribe 06/16/2017 at 5:02 PM.   Patient ID: Kristine [name], female    DOB: 11-Apr-1941, 76 y.o.   MRN: [id number]  Chief Complaint  Patient presents with  . Hypertension    been elavated for about 3 days   HPI Kristine [name] is a 76 y.o. female who presents to Primary Care at [facility] complaining of elevated BP. H/o multiple medical problems including CAD, LSA and Crest Syndrome. Seen today for concerns regarding BP. Takes Losartan 50 mg qd. Seen July 18 with a BP 122/68 at that time. Telephone note July 20 BP 100-110/30-48. Recommended to take BP medication if over 130/80 and to maintain hydration.   Pt states that she walked 0.5 mile at work to her building 3 days ago and noticed light-headedness. She had her BP checked with a reading of 100/30. Pt has also noticed fatigue x 2 months which is being followed by Dr. Nolon Rod. She checked her BP this morning with a reading of 147/75 but states she has not taken Losartan in 3 days. Also states that she was recently diagnosed with sleep apnea and has compliant with her dental device, but has noticed elevated BP in the morning and at night. She denies dizziness, HA, fever, dysuria, urinary frequency, urinary urgency, chest pain, sob. Pt had a stent placed on 7/6 by Dr. Burt Knack. Also had a CMP, CBC, Lyme, Vitamin D, B12 all which were reassuring. Moderate leukocytes on UA.  Patient Active Problem List   Diagnosis Date Noted  . Abnormal nuclear cardiac imaging test 05/30/2017  . Abnormal stress echo   . Solar lentigo 11/08/2016  . Acute blood loss anemia 10/10/2015  . Colitis 10/08/2015  . CAD (coronary artery disease), native coronary artery 06/27/2013  . Hyperlipidemia 06/27/2013  . Precordial pain 06/26/2013  . History of ST elevation  myocardial infarction (STEMI) 06/24/2013  . Hypertension   . Diverticulitis   . GERD (gastroesophageal reflux disease)   . Barrett's esophagus   . Cystocele   . Rectocele    Past Medical History:  Diagnosis Date  . Anemia   . Arthritis   . Barrett's esophagus    Stage B  . CAD (coronary artery disease)    a. s/p STEMI 7/14 >> LHC: LM 20, mLAD 30-40, small Dx 100 (L-L collats), OM1 90, OM2 30-40, mRCA 50-60, Lat/AL akinesis, EF 35-40% >> PCI: Promus DES to OM1;  b. Myoview 3/16: diaph atten, no ischemia, EF 59% (intermediate risk b/c BP drop 152>>137, but recovery BP 162 (tech error) >> med Rx continued  c. cath 05/30/17 s/p FFR guided PCI with DES to circumflex/first OM   . 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 Cystocele   . Diverticulitis   . GERD (gastroesophageal reflux disease)   . History of blood transfusion    hx of being Anti-K positive  . History of Doppler ultrasound    a. Carotid US 2/12: bilateral ICA < 50%  . Hyperlipidemia   . Hypertension   . Ischemic cardiomyopathy    a. EF 35-40% by LHC at time of MI;  b. Echo 7/14:  Mild focal basal septal hypertrophy, EF 50-55%, apical HK, grade 1 diastolic dysfunction, trivial AI   . Osteopenia 09/2012   T score -2.4 FRAX 13%/2.7%  per 10/15/2012 discussion [name] repeat  in 2 years  . Raynaud disease   . Rectocele    mild   Past Surgical History:  Procedure Laterality Date  . ABDOMINAL HYSTERECTOMY  1996   RSO for menorrhagia.  . CORONARY ANGIOPLASTY WITH STENT PLACEMENT  06/21/2013  . CORONARY STENT INTERVENTION N/A 05/30/2017   Procedure: Coronary Stent Intervention;  Surgeon: [name], MD;  Location: [place] [facility];  Service: Cardiovascular;  Laterality: N/A;  . FOOT SURGERY  2013   x2  . INTRAVASCULAR PRESSURE WIRE/FFR STUDY N/A 05/30/2017   Procedure: Intravascular Pressure Wire/FFR Study;  Surgeon: [name], MD;  Location: [place] [facility];  Service: Cardiovascular;  Laterality: N/A;  . JOINT  REPLACEMENT  9/10   rt total  . LEFT HEART CATH AND CORONARY ANGIOGRAPHY N/A 05/30/2017   Procedure: Left Heart Cath and Coronary Angiography;  Surgeon: [name], MD;  Location: [place] [facility];  Service: Cardiovascular;  Laterality: N/A;  . nissen fundoplication  2376  . TONSILLECTOMY  1950  . TOTAL KNEE ARTHROPLASTY     right  . TUBAL LIGATION  1980   Allergies  Allergen Reactions  . Aspirin Other (See Comments)    Other reaction(s): Other Can take 81 mg but not aspirin based medications Pt is prone to bleeding, ok with low dose coated aspirin  . Pravastatin     Other reaction(s): Other Other reaction(s): Other (See Comments) elevated LFT'S, tolerates 1/2 dose of zetia and 1/2 dose of pravastatin  . Flagyl [Metronidazole]     headaches  . Nsaids Other (See Comments)    Pt is prone to bleeding  . Pravastatin Sodium     Other reaction(s): Other (See Comments) elevated LFT'S, tolerates 1/2 dose of zetia and 1/2 dose of pravastatin  . Statins Other (See Comments)    elevated LFT'S, tolerates 1/2 dose of zetia and 1/2 dose of pravastatin   Prior to Admission medications   Medication Sig Start Date End Date Taking? Authorizing Provider  acetaminophen (TYLENOL) 500 MG tablet Take 1,000 mg by mouth every 6 (six) hours as needed for headache.    [provider]  ALPRAZolam Duanne Moron) 0.25 MG tablet Take 1 tablet (0.25 mg total) by mouth daily as needed for anxiety. 08/15/16   Darlyne Russian, MD  aspirin EC 81 MG EC tablet Take 1 tablet (81 mg total) by mouth daily. 06/24/13   Barrett, Evelene Croon, PA-C  clopidogrel (PLAVIX) 75 MG tablet Take 1 tablet (75 mg total) by mouth daily. 05/30/17   Cheryln Manly, NP  ezetimibe (ZETIA) 10 MG tablet TAKE ONE-HALF TABLET BY MOUTH DAILY 06/09/17   Isaiah Serge, NP  losartan (COZAAR) 50 MG tablet TAKE 1/2 TABLET BY MOUTH ONCE DAILY 05/26/17   [name], MD  Multiple Vitamin (MULTIVITAMIN WITH MINERALS) TABS tablet Take 1 tablet by  mouth daily.    [provider]  nitroGLYCERIN (NITROSTAT) 0.4 MG SL tablet Place 1 tablet (0.4 mg total) under the tongue every 5 (five) minutes x 3 doses as needed for chest pain. 11/14/15   Richardson Dopp T, PA-C  pantoprazole (PROTONIX) 40 MG tablet Take 40 mg by mouth daily.    [provider]  pravastatin (PRAVACHOL) 10 MG tablet Take 1 tablet (10 mg total) by mouth every evening. 05/30/17   [name], MD  ranitidine (ZANTAC) 150 MG tablet Take 150 mg by mouth at bedtime.    [provider]  rifaximin (XIFAXAN) 550 MG TABS tablet Take 550 mg by  mouth daily.    [provider]   Social History   Social History  . Marital status: Divorced    Spouse name: N/A  . Number of children: N/A  . Years of education: N/A   Occupational History  . Not on file.   Social History Main Topics  . Smoking status: Never Smoker  . Smokeless tobacco: Never Used  . Alcohol use 0.0 oz/week     Comment: Occasional  . Drug use: No  . Sexual activity: No   Other Topics Concern  . Not on file   Social History Narrative   Exercise dancing 3 x times weekly for 1-2 hours   Review of Systems  Constitutional: Positive for fatigue. Negative for fever.  Respiratory: Negative for shortness of breath.   Cardiovascular: Negative for chest pain.  Genitourinary: Negative for dysuria, frequency and urgency.  Neurological: Negative for dizziness and headaches.      Objective:   Physical Exam  Constitutional: She is oriented to person, place, and time. She appears well-developed and well-nourished.  HENT:  Head: Normocephalic and atraumatic.  Eyes: Pupils are equal, round, and reactive to light. Conjunctivae and EOM are normal.  Neck: Carotid bruit is not present.  Cardiovascular: Normal rate, regular rhythm, normal heart sounds and intact distal pulses.   Pulmonary/Chest: Effort normal and breath sounds normal.  Abdominal: Soft. She exhibits no distension and no  pulsatile midline mass. There is no tenderness. There is no CVA tenderness.  Neurological: She is alert and oriented to person, place, and time.  Skin: Skin is warm and dry.  Toes warm and dry. Cap refill less than 3 seconds.  Psychiatric: She has a normal mood and affect. Her behavior is normal.  Vitals reviewed.  Vitals:   06/16/17 1634  BP: 126/70  Pulse: 98  Resp: 18  Temp: 99 F (37.2 C)  TempSrc: Oral  SpO2: 99%  Weight: 131 lb (59.4 kg)  Height: 5' 0.63" (1.54 m)      Assessment & [name]:  Kristine [name] is a 76 y.o. female Fatigue, unspecified type - [name]: Urine Culture, Dysuria - [name]: Urine Culture  - Previous blood work overall reassuring, and was reviewed with patient. Did have some leukocytes on urinalysis, will check urine culture, but otherwise minimal symptoms at present. Hold on antibiotics at this time. She was unable to provide urine in office, will return the following morning for urine culture. RTC precautions  Hypotension, unspecified hypotension type  -Borderline readings, discussed possible isolated systolic hypertension at her age, but would yield to recommendations from her cardiologist. At this point would recommend holding off on medication unless her pressure remains above 140, then discuss with cardiology whether or not to restart her losartan.    No orders of the defined types were placed in this encounter.  Patient Instructions    Blood pressure appears to be creeping back up off medicine, but only in the morning and only the upper number. If pressure remains over 140, would recommend talking to cardiology to decide if you need to restart the 1/2 pill of losartan.   You did have some infection fighting cells in your urine testing at last visit, but that is not specific for infection. Return for urine testing in the morning and I will order a culture.  If any new urinary symptoms, abdominal pain, fever or other new symptoms - please return to discuss  further.   Return to the clinic or go to the nearest emergency room  if any of your symptoms worsen or new symptoms occur.    IF you received an x-ray today, you will receive an invoice from [facility]. Please contact [facility] at [phone] with questions or concerns regarding your invoice.   IF you received labwork today, you will receive an invoice from [facility]. Please contact [facility] at [phone] with questions or concerns regarding your invoice.   Our billing staff will not be able to assist you with questions regarding bills from these companies.  You will be contacted with the lab results as soon as they are available. The fastest way to get your results is to activate your My Chart account. Instructions are located on the last page of this paperwork. If you have not heard from [place] regarding the results in 2 weeks, please contact this office.       I personally performed the services described in this documentation, which was scribed in my presence. The recorded information has been reviewed and considered for accuracy and completeness, addended by me as needed, and agree with information above.  Signed,   Merri Ray, MD Primary Care at [place].  06/16/17 6:03 PM

## 2017-06-17 NOTE — Telephone Encounter (Signed)
Follow up    Pt called back wanting to know why she hasnt received a phone call back, I explained that Dr Burt Knack is still in clinic and Ander Purpura would call when she was free.

## 2017-06-17 NOTE — Telephone Encounter (Signed)
I spoke with the pt and made her aware that we will help facilitate referral to phase 2 cardiac rehab at [facility].

## 2017-06-17 NOTE — Telephone Encounter (Signed)
Follow up     Pt wants to know what she is to do about the losartan, she called last week and has not received a follow up call.

## 2017-06-17 NOTE — Telephone Encounter (Signed)
I spoke with the pt and made her aware of Dr Antionette Char recommendation. The pt's BP this morning was 149/78.  If the pt's SBP is consistently greater than 140 then she will take losartan. Pt agreed with plan.

## 2017-06-18 NOTE — Telephone Encounter (Signed)
Pt seen on 7/23

## 2017-06-19 NOTE — Telephone Encounter (Signed)
Referral received and placed in Dr Antionette Char folder for signature and then to return fax to [facility].

## 2017-06-19 NOTE — Telephone Encounter (Signed)
Voicemail left for Kristine [name] [phone] at [facility] to fax referral to our office for pt to start cardiac rehabilitation.

## 2017-07-03 ENCOUNTER — Other Ambulatory Visit: Payer: Self-pay | Admitting: Emergency Medicine

## 2017-07-03 ENCOUNTER — Telehealth: Payer: Self-pay | Admitting: *Deleted

## 2017-07-03 ENCOUNTER — Encounter: Payer: Self-pay | Admitting: Emergency Medicine

## 2017-07-03 ENCOUNTER — Ambulatory Visit: Payer: BLUE CROSS/BLUE SHIELD | Admitting: Emergency Medicine

## 2017-07-03 ENCOUNTER — Ambulatory Visit (INDEPENDENT_AMBULATORY_CARE_PROVIDER_SITE_OTHER): Payer: BLUE CROSS/BLUE SHIELD | Admitting: Emergency Medicine

## 2017-07-03 VITALS — BP 152/84 | HR 89 | Temp 98.0°F | Resp 18 | Ht 60.24 in | Wt 132.0 lb

## 2017-07-03 DIAGNOSIS — R197 Diarrhea, unspecified: Secondary | ICD-10-CM | POA: Diagnosis not present

## 2017-07-03 DIAGNOSIS — R1030 Lower abdominal pain, unspecified: Secondary | ICD-10-CM | POA: Diagnosis not present

## 2017-07-03 DIAGNOSIS — I2510 Atherosclerotic heart disease of native coronary artery without angina pectoris: Secondary | ICD-10-CM

## 2017-07-03 LAB — POCT CBC
Granulocyte percent: 65.6 %G (ref 37–80)
HEMATOCRIT: 34.5 % — AB (ref 37.7–47.9)
Hemoglobin: 11.3 g/dL — AB (ref 12.2–16.2)
LYMPH, POC: 2.1 (ref 0.6–3.4)
MCH, POC: 25.9 pg — AB (ref 27–31.2)
MCHC: 32.8 g/dL (ref 31.8–35.4)
MCV: 78.9 fL — AB (ref 80–97)
MID (CBC): 0.3 (ref 0–0.9)
MPV: 7.2 fL (ref 0–99.8)
POC GRANULOCYTE: 4.5 (ref 2–6.9)
POC LYMPH %: 30 % (ref 10–50)
POC MID %: 4.4 %M (ref 0–12)
Platelet Count, POC: 194 10*3/uL (ref 142–424)
RBC: 4.37 M/uL (ref 4.04–5.48)
RDW, POC: 15.7 %
WBC: 6.9 10*3/uL (ref 4.6–10.2)

## 2017-07-03 NOTE — Patient Instructions (Addendum)
IF you received an x-ray today, you will receive an invoice from [facility]. Please contact [facility] at [phone] with questions or concerns regarding your invoice.   IF you received labwork today, you will receive an invoice from [address]. Please contact [facility] at [phone] with questions or concerns regarding your invoice.   Our billing staff will not be able to assist you with questions regarding bills from these companies.  You will be contacted with the lab results as soon as they are available. The fastest way to get your results is to activate your My Chart account. Instructions are located on the last page of this paperwork. If you have not heard from [place] regarding the results in 2 weeks, please contact this office.      Abdominal Pain, Adult Many things can cause belly (abdominal) pain. Most times, belly pain is not dangerous. Many cases of belly pain can be watched and treated at home. Sometimes belly pain is serious, though. Your doctor will try to find the cause of your belly pain. Follow these instructions at home:  Take over-the-counter and prescription medicines only as told by your doctor. Do not take medicines that help you poop (laxatives) unless told to by your doctor.  Drink enough fluid to keep your pee (urine) clear or pale yellow.  Watch your belly pain for any changes.  Keep all follow-up visits as told by your doctor. This is important. Contact a doctor if:  Your belly pain changes or gets worse.  You are not hungry, or you lose weight without trying.  You are having trouble pooping (constipated) or have watery poop (diarrhea) for more than 2-3 days.  You have pain when you pee or poop.  Your belly pain wakes you up at night.  Your pain gets worse with meals, after eating, or with certain foods.  You are throwing up and cannot keep anything down.  You have a fever. Get help right away if:  Your pain does not go  away as soon as your doctor says it should.  You cannot stop throwing up.  Your pain is only in areas of your belly, such as the right side or the left lower part of the belly.  You have bloody or black poop, or poop that looks like tar.  You have very bad pain, cramping, or bloating in your belly.  You have signs of not having enough fluid or water in your body (dehydration), such as: ? Dark pee, very little pee, or no pee. ? Cracked lips. ? Dry mouth. ? Sunken eyes. ? Sleepiness. ? Weakness. This information is not intended to replace advice given to you by your health care provider. Make sure you discuss any questions you have with your health care provider. Document Released: 04/29/2008 Document Revised: 05/31/2016 Document Reviewed: 04/24/2016 Elsevier Interactive Patient Education  2017 [place] Diet A bland diet consists of foods that do not have a lot of fat or fiber. Foods without fat or fiber are easier for the body to digest. They are also less likely to irritate your mouth, throat, stomach, and other parts of your gastrointestinal tract. A bland diet is sometimes called a BRAT diet. What is my plan? Your health care provider or dietitian may recommend specific changes to your diet to prevent and treat your symptoms, such as:  Eating small meals often.  Cooking food until it is soft enough to chew easily.  Chewing your food well.  Drinking fluids slowly.  Not eating foods that are very spicy, sour, or fatty.  Not eating citrus fruits, such as oranges and grapefruit.  What do I need to know about this diet?  Eat a variety of foods from the bland diet food list.  Do not follow a bland diet longer than you have to.  Ask your health care provider whether you should take vitamins. What foods can I eat? Grains  Hot cereals, such as cream of wheat. Bread, crackers, or tortillas made from refined white flour. Rice. Vegetables Canned or cooked  vegetables. Mashed or boiled potatoes. Fruits Bananas. Applesauce. Other types of cooked or canned fruit with the skin and seeds removed, such as canned peaches or pears. Meats and Other Protein Sources Scrambled eggs. Creamy peanut butter or other nut butters. Lean, well-cooked meats, such as chicken or fish. Tofu. Soups or broths. Dairy Low-fat dairy products, such as milk, cottage cheese, or yogurt. Beverages Water. Herbal tea. Apple juice. Sweets and Desserts Pudding. Custard. Fruit gelatin. Ice cream. Fats and Oils Mild salad dressings. Canola or olive oil. The items listed above may not be a complete list of allowed foods or beverages. Contact your dietitian for more options. What foods are not recommended? Foods and ingredients that are often not recommended include:  Spicy foods, such as hot sauce or salsa.  Fried foods.  Sour foods, such as pickled or fermented foods.  Raw vegetables or fruits, especially citrus or berries.  Caffeinated drinks.  Alcohol.  Strongly flavored seasonings or condiments.  The items listed above may not be a complete list of foods and beverages that are not allowed. Contact your dietitian for more information. This information is not intended to replace advice given to you by your health care provider. Make sure you discuss any questions you have with your health care provider. Document Released: 03/04/2016 Document Revised: 04/18/2016 Document Reviewed: 11/23/2014 Elsevier Interactive Patient Education  2018 [employer].

## 2017-07-03 NOTE — Telephone Encounter (Signed)
Pt called back in response to message. She said the doctor that used to prescribe her Xanax was Dr. Everlene Farrier and he is retired. Pt can be contacted at [phone].

## 2017-07-03 NOTE — Telephone Encounter (Signed)
Left message voice mail of cell number to call provider who prescribes her Xanax.

## 2017-07-03 NOTE — Progress Notes (Signed)
[name] 76 y.o.   Chief Complaint  Patient presents with  . Abdominal Pain    HISTORY OF PRESENT ILLNESS: This is a 76 y.o. female complaining of increased gas last Tuesday after eating followed by watery non-bloody diarrhea and lower abdominal cramping; started BRAT diet with improvement; denies fever, n/v, or any other significant symptoms.  HPI   Prior to Admission medications   Medication Sig Start Date End Date Taking? Authorizing Provider  acetaminophen (TYLENOL) 500 MG tablet Take 1,000 mg by mouth every 6 (six) hours as needed for headache.   Yes [provider]  ALPRAZolam (XANAX) 0.25 MG tablet Take 1 tablet (0.25 mg total) by mouth daily as needed for anxiety. 08/15/16  Yes Darlyne Russian, MD  aspirin EC 81 MG EC tablet Take 1 tablet (81 mg total) by mouth daily. 06/24/13  Yes Barrett, Evelene Croon, PA-C  clopidogrel (PLAVIX) 75 MG tablet Take 1 tablet (75 mg total) by mouth daily. 05/30/17  Yes Cheryln Manly, NP  ezetimibe (ZETIA) 10 MG tablet TAKE ONE-HALF TABLET BY MOUTH DAILY 06/09/17  Yes Isaiah Serge, NP  losartan (COZAAR) 50 MG tablet TAKE 1/2 TABLET BY MOUTH ONCE DAILY 05/26/17  Yes [name], MD  Multiple Vitamin (MULTIVITAMIN WITH MINERALS) TABS tablet Take 1 tablet by mouth daily.   Yes [provider]  nitroGLYCERIN (NITROSTAT) 0.4 MG SL tablet Place 1 tablet (0.4 mg total) under the tongue every 5 (five) minutes x 3 doses as needed for chest pain. 11/14/15  Yes Weaver, Scott T, PA-C  pantoprazole (PROTONIX) 40 MG tablet Take 40 mg by mouth daily.   Yes [provider]  pravastatin (PRAVACHOL) 10 MG tablet Take 1 tablet (10 mg total) by mouth every evening. 05/30/17  Yes [name], MD  ranitidine (ZANTAC) 150 MG tablet Take 150 mg by mouth at bedtime.   Yes [provider]  rifaximin (XIFAXAN) 550 MG TABS tablet Take 550 mg by mouth daily.   Yes [provider]    Allergies  Allergen Reactions  . Aspirin  Other (See Comments)    Other reaction(s): Other Can take 81 mg but not aspirin based medications Pt is prone to bleeding, ok with low dose coated aspirin  . Pravastatin     Other reaction(s): Other Other reaction(s): Other (See Comments) elevated LFT'S, tolerates 1/2 dose of zetia and 1/2 dose of pravastatin  . Flagyl [Metronidazole]     headaches  . Nsaids Other (See Comments)    Pt is prone to bleeding  . Pravastatin Sodium     Other reaction(s): Other (See Comments) elevated LFT'S, tolerates 1/2 dose of zetia and 1/2 dose of pravastatin  . Statins Other (See Comments)    elevated LFT'S, tolerates 1/2 dose of zetia and 1/2 dose of pravastatin    Patient Active Problem List   Diagnosis Date Noted  . Abnormal nuclear cardiac imaging test 05/30/2017  . Abnormal stress echo   . Solar lentigo 11/08/2016  . Acute blood loss anemia 10/10/2015  . Colitis 10/08/2015  . CAD (coronary artery disease), native coronary artery 06/27/2013  . Hyperlipidemia 06/27/2013  . Precordial pain 06/26/2013  . History of ST elevation myocardial infarction (STEMI) 06/24/2013  . Hypertension   . Diverticulitis   . GERD (gastroesophageal reflux disease)   . Barrett's esophagus   . Cystocele   . Rectocele     Past Medical History:  Diagnosis Date  . Anemia   . Arthritis   . Barrett's  esophagus    Stage B  . CAD (coronary artery disease)    a. s/p STEMI 7/14 >> LHC: LM 20, mLAD 30-40, small Dx 100 (L-L collats), OM1 90, OM2 30-40, mRCA 50-60, Lat/AL akinesis, EF 35-40% >> PCI: Promus DES to OM1;  b. Myoview 3/16: diaph atten, no ischemia, EF 59% (intermediate risk b/c BP drop 152>>137, but recovery BP 162 (tech error) >> med Rx continued  c. cath 05/30/17 s/p FFR guided PCI with DES to circumflex/first OM   . CREST variant of scleroderma ([place])   . Cystocele   . Diverticulitis   . GERD (gastroesophageal reflux disease)   . History of blood transfusion    hx of being Anti-K positive  . History of  Doppler ultrasound    a. Carotid US 2/12: bilateral ICA < 50%  . Hyperlipidemia   . Hypertension   . Ischemic cardiomyopathy    a. EF 35-40% by LHC at time of MI;  b. Echo 7/14:  Mild focal basal septal hypertrophy, EF 50-55%, apical HK, grade 1 diastolic dysfunction, trivial AI   . Osteopenia 09/2012   T score -2.4 FRAX 13%/2.7%  per 10/15/2012 discussion plan repeat in 2 years  . Raynaud disease   . Rectocele    mild    Past Surgical History:  Procedure Laterality Date  . ABDOMINAL HYSTERECTOMY  1996   RSO for menorrhagia.  . CORONARY ANGIOPLASTY WITH STENT PLACEMENT  06/21/2013  . CORONARY STENT INTERVENTION N/A 05/30/2017   Procedure: Coronary Stent Intervention;  Surgeon: [name], MD;  Location: [facility];  Service: Cardiovascular;  Laterality: N/A;  . FOOT SURGERY  2013   x2  . INTRAVASCULAR PRESSURE WIRE/FFR STUDY N/A 05/30/2017   Procedure: Intravascular Pressure Wire/FFR Study;  Surgeon: [name], MD;  Location: [facility];  Service: Cardiovascular;  Laterality: N/A;  . JOINT REPLACEMENT  9/10   rt total  . LEFT HEART CATH AND CORONARY ANGIOGRAPHY N/A 05/30/2017   Procedure: Left Heart Cath and Coronary Angiography;  Surgeon: [name], MD;  Location: [facility];  Service: Cardiovascular;  Laterality: N/A;  . nissen fundoplication  3235  . TONSILLECTOMY  1950  . TOTAL KNEE ARTHROPLASTY     right  . TUBAL LIGATION  1980    Social History   Social History  . Marital status: Divorced    Spouse name: N/A  . Number of children: N/A  . Years of education: N/A   Occupational History  . Not on file.   Social History Main Topics  . Smoking status: Never Smoker  . Smokeless tobacco: Never Used  . Alcohol use 0.0 oz/week     Comment: Occasional  . Drug use: No  . Sexual activity: No   Other Topics Concern  . Not on file   Social History Narrative   Exercise dancing 3 x times weekly for 1-2 hours    Family History    Problem Relation Age of Onset  . Cancer Mother        Pancreatic Cancer  . Hepatitis C Mother   . Heart disease Father        congestive heart failure  . Heart attack Father   . Panic disorder Daughter   . Asthma Maternal Grandmother   . Emphysema Maternal Grandmother   . Cancer Maternal Grandfather        lung  . Stroke Neg Hx      Review of Systems  Constitutional: Negative.  Negative for chills and fever.  HENT: Negative.   Eyes: Negative.   Respiratory: Negative.  Negative for shortness of breath.   Cardiovascular: Negative.  Negative for chest pain and palpitations.  Gastrointestinal: Positive for abdominal pain and diarrhea. Negative for blood in stool, melena, nausea and vomiting.  Genitourinary: Negative.  Negative for dysuria and hematuria.  Musculoskeletal: Negative for back pain, myalgias and neck pain.  Skin: Negative for rash.  Neurological: Negative.  Negative for dizziness and headaches.  Endo/Heme/Allergies: Negative.   All other systems reviewed and are negative.    Vitals:   07/03/17 1408  BP: (!) 152/84  Pulse: 89  Resp: 18  Temp: 98 F (36.7 C)  SpO2: 99%     Physical Exam  Constitutional: She is oriented to person, place, and time. She appears well-developed and well-nourished.  HENT:  Head: Normocephalic and atraumatic.  Right Ear: External ear normal.  Left Ear: External ear normal.  Nose: Nose normal.  Mouth/Throat: Oropharynx is clear and moist.  Eyes: Pupils are equal, round, and reactive to light. Conjunctivae and EOM are normal.  Neck: Normal range of motion. Neck supple.  Cardiovascular: Normal rate, regular rhythm, normal heart sounds and intact distal pulses.   Pulmonary/Chest: Effort normal and breath sounds normal.  Abdominal: Soft. Bowel sounds are normal. She exhibits no distension and no mass. There is no tenderness. There is no rebound and no guarding.  Musculoskeletal: Normal range of motion.  Neurological: She is alert  and oriented to person, place, and time. No sensory deficit. She exhibits normal muscle tone.  Skin: Skin is warm and dry. Capillary refill takes less than 2 seconds. No rash noted.  Psychiatric: She has a normal mood and affect. Her behavior is normal.  Vitals reviewed.  Results for orders placed or performed in visit on 07/03/17 (from the past 24 hour(s))  POCT CBC     Status: Abnormal   Collection Time: 07/03/17  3:01 PM  Result Value Ref Range   WBC 6.9 4.6 - 10.2 K/uL   Lymph, poc 2.1 0.6 - 3.4   POC LYMPH PERCENT 30.0 10 - 50 %L   MID (cbc) 0.3 0 - 0.9   POC MID % 4.4 0 - 12 %M   POC Granulocyte 4.5 2 - 6.9   Granulocyte percent 65.6 37 - 80 %G   RBC 4.37 4.04 - 5.48 M/uL   Hemoglobin 11.3 (A) 12.2 - 16.2 g/dL   HCT, POC 34.5 (A) 37.7 - 47.9 %   MCV 78.9 (A) 80 - 97 fL   MCH, POC 25.9 (A) 27 - 31.2 pg   MCHC 32.8 31.8 - 35.4 g/dL   RDW, POC 15.7 %   Platelet Count, POC 194 142 - 424 K/uL   MPV 7.2 0 - 99.8 fL    ASSESSMENT & PLAN: Fernanda was seen today for abdominal pain.  Diagnoses and all orders for this visit:  Lower abdominal pain -     Comprehensive metabolic panel -     POCT CBC  Diarrhea, unspecified type    Patient Instructions       IF you received an x-ray today, you will receive an invoice from [facility]. Please contact [facility] at [phone] with questions or concerns regarding your invoice.   IF you received labwork today, you will receive an invoice from [facility]. Please contact [facility] at [phone] with questions or concerns regarding your invoice.   Our billing staff will not be able to assist you  with questions regarding bills from these companies.  You will be contacted with the lab results as soon as they are available. The fastest way to get your results is to activate your My Chart account. Instructions are located on the last page of this paperwork. If you have not heard from [place] regarding the results in 2  weeks, please contact this office.      Abdominal Pain, Adult Many things can cause belly (abdominal) pain. Most times, belly pain is not dangerous. Many cases of belly pain can be watched and treated at home. Sometimes belly pain is serious, though. Your doctor will try to find the cause of your belly pain. Follow these instructions at home:  Take over-the-counter and prescription medicines only as told by your doctor. Do not take medicines that help you poop (laxatives) unless told to by your doctor.  Drink enough fluid to keep your pee (urine) clear or pale yellow.  Watch your belly pain for any changes.  Keep all follow-up visits as told by your doctor. This is important. Contact a doctor if:  Your belly pain changes or gets worse.  You are not hungry, or you lose weight without trying.  You are having trouble pooping (constipated) or have watery poop (diarrhea) for more than 2-3 days.  You have pain when you pee or poop.  Your belly pain wakes you up at night.  Your pain gets worse with meals, after eating, or with certain foods.  You are throwing up and cannot keep anything down.  You have a fever. Get help right away if:  Your pain does not go away as soon as your doctor says it should.  You cannot stop throwing up.  Your pain is only in areas of your belly, such as the right side or the left lower part of the belly.  You have bloody or black poop, or poop that looks like tar.  You have very bad pain, cramping, or bloating in your belly.  You have signs of not having enough fluid or water in your body (dehydration), such as: ? Dark pee, very little pee, or no pee. ? Cracked lips. ? Dry mouth. ? Sunken eyes. ? Sleepiness. ? Weakness. This information is not intended to replace advice given to you by your health care provider. Make sure you discuss any questions you have with your health care provider. Document Released: 04/29/2008 Document Revised:  05/31/2016 Document Reviewed: 04/24/2016 Elsevier Interactive Patient Education  2017 [place] Diet A bland diet consists of foods that do not have a lot of fat or fiber. Foods without fat or fiber are easier for the body to digest. They are also less likely to irritate your mouth, throat, stomach, and other parts of your gastrointestinal tract. A bland diet is sometimes called a BRAT diet. What is my plan? Your health care provider or dietitian may recommend specific changes to your diet to prevent and treat your symptoms, such as:  Eating small meals often.  Cooking food until it is soft enough to chew easily.  Chewing your food well.  Drinking fluids slowly.  Not eating foods that are very spicy, sour, or fatty.  Not eating citrus fruits, such as oranges and grapefruit.  What do I need to know about this diet?  Eat a variety of foods from the bland diet food list.  Do not follow a bland diet longer than you have to.  Ask your health care provider whether you should  take vitamins. What foods can I eat? Grains  Hot cereals, such as cream of wheat. Bread, crackers, or tortillas made from refined white flour. Rice. Vegetables Canned or cooked vegetables. Mashed or boiled potatoes. Fruits Bananas. Applesauce. Other types of cooked or canned fruit with the skin and seeds removed, such as canned peaches or pears. Meats and Other Protein Sources Scrambled eggs. Creamy peanut butter or other nut butters. Lean, well-cooked meats, such as chicken or fish. Tofu. Soups or broths. Dairy Low-fat dairy products, such as milk, cottage cheese, or yogurt. Beverages Water. Herbal tea. Apple juice. Sweets and Desserts Pudding. Custard. Fruit gelatin. Ice cream. Fats and Oils Mild salad dressings. Canola or olive oil. The items listed above may not be a complete list of allowed foods or beverages. Contact your dietitian for more options. What foods are not recommended? Foods  and ingredients that are often not recommended include:  Spicy foods, such as hot sauce or salsa.  Fried foods.  Sour foods, such as pickled or fermented foods.  Raw vegetables or fruits, especially citrus or berries.  Caffeinated drinks.  Alcohol.  Strongly flavored seasonings or condiments.  The items listed above may not be a complete list of foods and beverages that are not allowed. Contact your dietitian for more information. This information is not intended to replace advice given to you by your health care provider. Make sure you discuss any questions you have with your health care provider. Document Released: 03/04/2016 Document Revised: 04/18/2016 Document Reviewed: 11/23/2014 Elsevier Interactive Patient Education  2018 Elsevier Inc.      Agustina Caroli, MD [facility]

## 2017-07-04 LAB — COMPREHENSIVE METABOLIC PANEL
A/G RATIO: 1.8 (ref 1.2–2.2)
ALBUMIN: 4.1 g/dL (ref 3.5–4.8)
ALT: 14 IU/L (ref 0–32)
AST: 18 IU/L (ref 0–40)
Alkaline Phosphatase: 104 IU/L (ref 39–117)
BUN / CREAT RATIO: 14 (ref 12–28)
BUN: 10 mg/dL (ref 8–27)
CALCIUM: 9.4 mg/dL (ref 8.7–10.3)
CO2: 24 mmol/L (ref 20–29)
Chloride: 104 mmol/L (ref 96–106)
Creatinine, Ser: 0.73 mg/dL (ref 0.57–1.00)
GFR, EST AFRICAN AMERICAN: 94 mL/min/{1.73_m2} (ref >59)
GFR, EST NON AFRICAN AMERICAN: 81 mL/min/{1.73_m2} (ref >59)
Globulin, Total: 2.3 g/dL (ref 1.5–4.5)
Glucose: 112 mg/dL — ABNORMAL HIGH (ref 65–99)
POTASSIUM: 4.2 mmol/L (ref 3.5–5.2)
Sodium: 141 mmol/L (ref 134–144)
TOTAL PROTEIN: 6.4 g/dL (ref 6.0–8.5)

## 2017-07-08 ENCOUNTER — Telehealth: Payer: Self-pay | Admitting: *Deleted

## 2017-07-08 NOTE — Telephone Encounter (Signed)
Patient states she is not feeling better.  Still has diarrhea wants to know if there is a medication in .  She states she is still doing the bland diet and is missing to much work.  Please advise

## 2017-07-08 NOTE — Telephone Encounter (Signed)
Explained patient needs to establish care with a provider to have her Xanax filled.  Patient understood and will book appointment when feeling better.

## 2017-07-09 NOTE — Telephone Encounter (Signed)
Needs OV.  

## 2017-07-10 ENCOUNTER — Telehealth: Payer: Self-pay | Admitting: Cardiovascular Disease

## 2017-07-10 NOTE — Telephone Encounter (Signed)
Kristine [name] is calling because she has sent her papers for her FMLA to be signed by Dr. Burt Knack on July 20,2018 and her company has not received them as of yet , she has called several times to find out when they will be ready. Her HR department called her on yesterday 07/09/17 that if her paperwork is not in today her FMLA will be declined and she can lose her job. She is asking that that paperwork please be signed and she will pick up the paperwork .

## 2017-07-10 NOTE — Telephone Encounter (Signed)
Patient was at [facility] at the time she called. Barbette Reichmann, medical records tech, had retrieved the documents from [employer] and asked Richardson Dopp to fill them out. Kim handed the signed documents to Ms. Lorelei Pont.

## 2017-07-16 NOTE — Telephone Encounter (Signed)
Patient states she is feeling better.  Patient wanted a refill of Xanax and was advised she will need to establish care with a new provider for that.   Patient understood.

## 2017-08-16 DIAGNOSIS — R05 Cough: Secondary | ICD-10-CM | POA: Diagnosis not present

## 2017-08-16 DIAGNOSIS — J069 Acute upper respiratory infection, unspecified: Secondary | ICD-10-CM | POA: Diagnosis not present

## 2017-08-23 IMAGING — CR DG ABDOMEN ACUTE W/ 1V CHEST
3 series · 3 of 3 positions shown · non-contrast
Comparison: Chest x-ray 03/07/2015, 01/24/2015, 08/29/2014.

CLINICAL DATA: Nausea and vomiting.

EXAM:
DG ABDOMEN ACUTE W/ 1V CHEST

[PA]
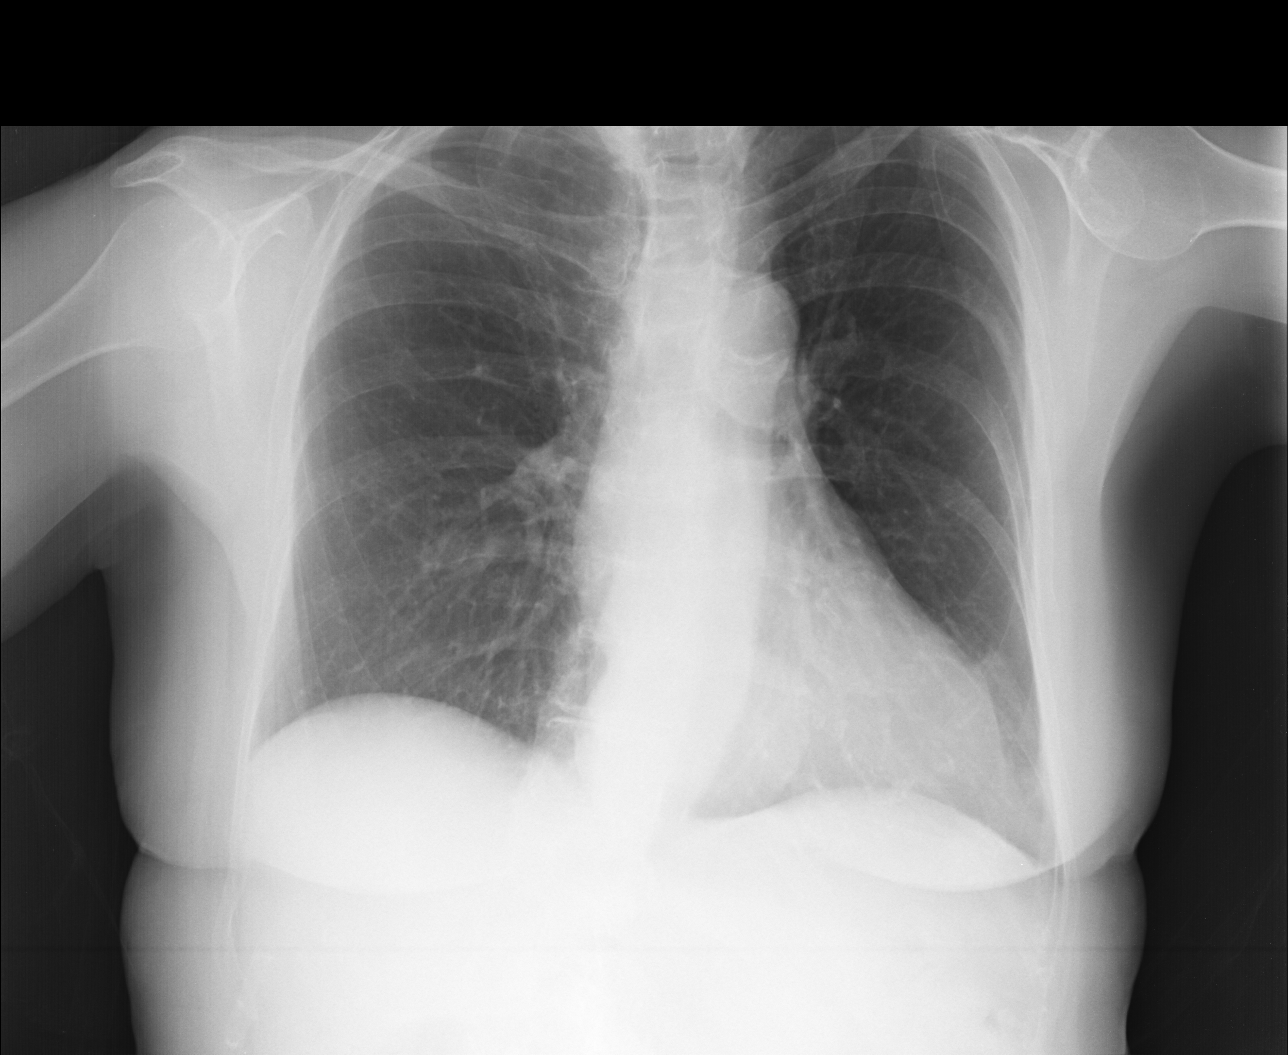

[AP (1 of 2)]
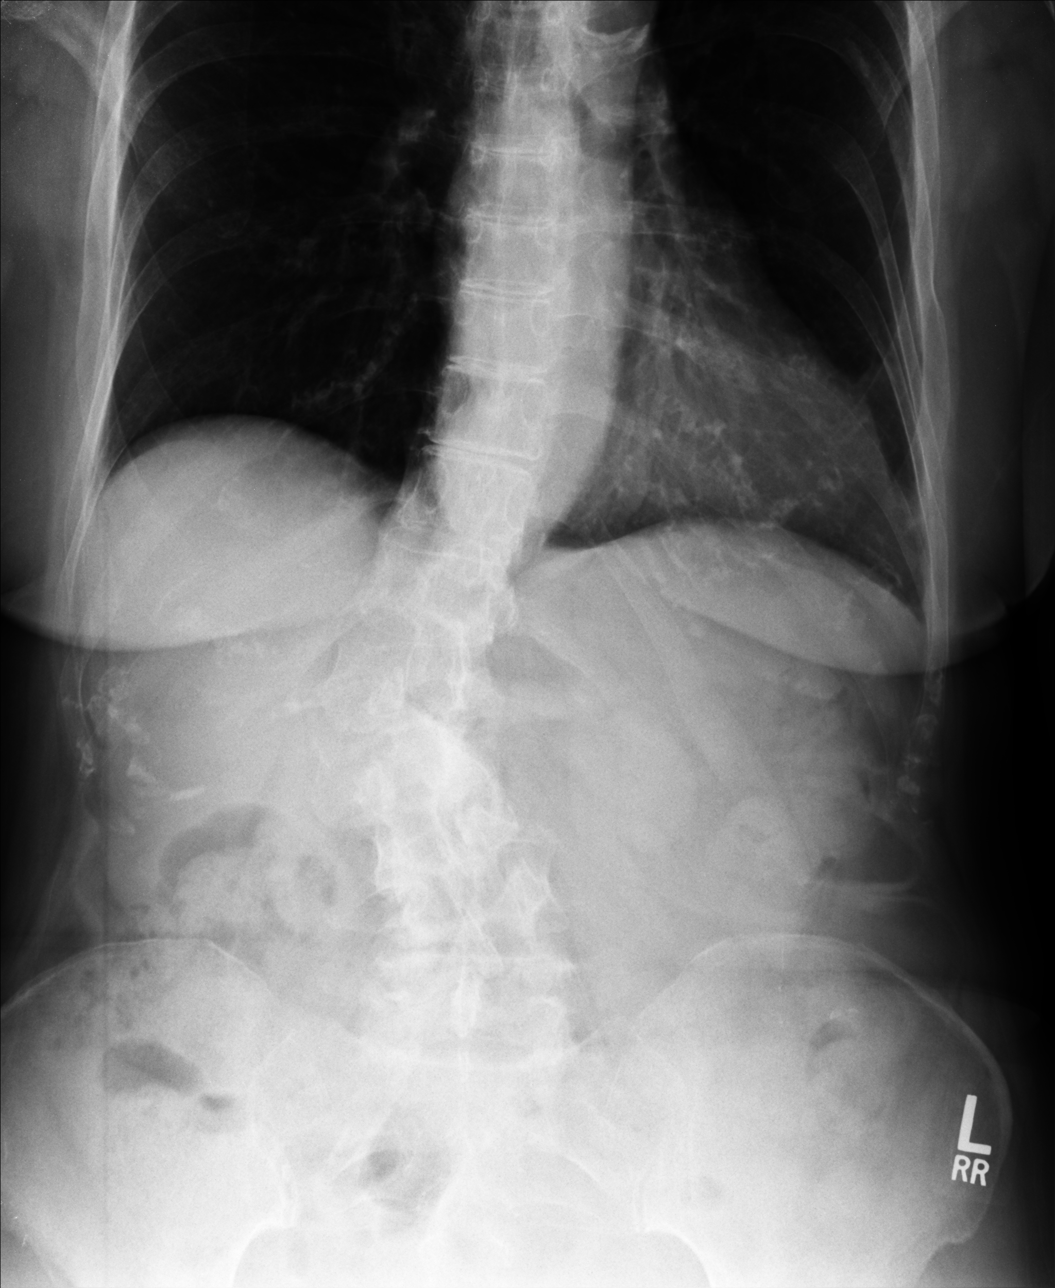

[AP (2 of 2)]
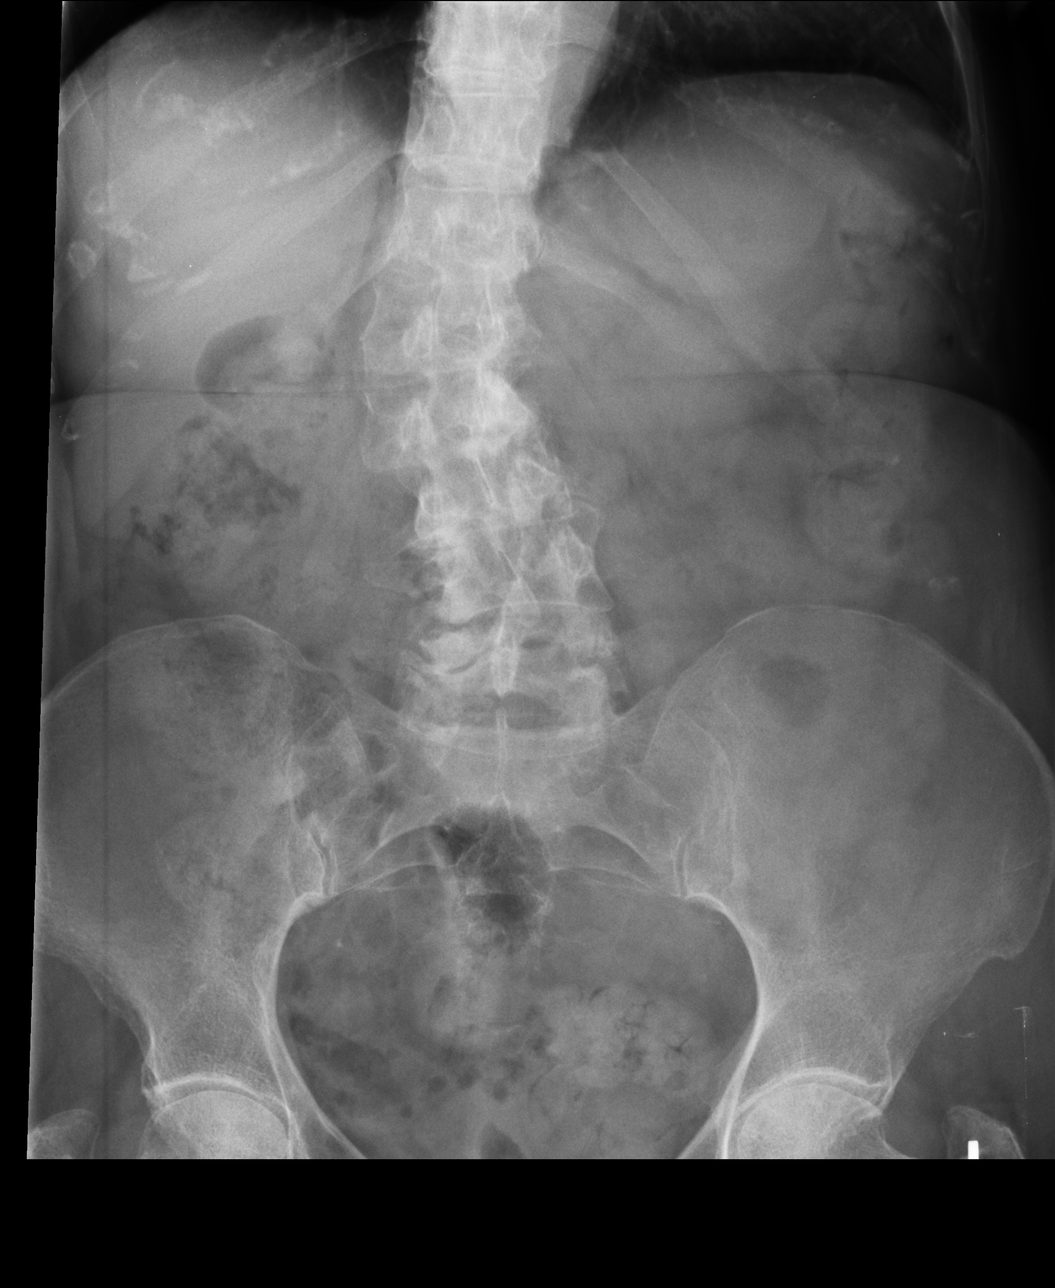

[3 of 3 positions shown; findings below may reference images not displayed]

FINDINGS: Mediastinum hilar structures normal. Lungs are clear of acute
infiltrates. Left base pleural parenchymal thickening consistent
with scarring.

Soft tissue structures are unremarkable. Large amount of stool noted
throughout the colon. Constipation cannot be excluded. Degenerative
changes and scoliosis lumbar spine. Degenerative changes both hips.
IMPRESSION: 1. Large amount stool noted throughout the colon suggesting
constipation. No bowel distention.
2. No acute cardiopulmonary disease.

## 2017-09-02 DIAGNOSIS — J209 Acute bronchitis, unspecified: Secondary | ICD-10-CM | POA: Diagnosis not present

## 2017-09-02 DIAGNOSIS — H1131 Conjunctival hemorrhage, right eye: Secondary | ICD-10-CM | POA: Diagnosis not present

## 2017-09-08 DIAGNOSIS — Z48812 Encounter for surgical aftercare following surgery on the circulatory system: Secondary | ICD-10-CM | POA: Diagnosis not present

## 2017-09-08 DIAGNOSIS — J40 Bronchitis, not specified as acute or chronic: Secondary | ICD-10-CM | POA: Diagnosis not present

## 2017-09-08 DIAGNOSIS — Z955 Presence of coronary angioplasty implant and graft: Secondary | ICD-10-CM | POA: Diagnosis not present

## 2017-09-08 DIAGNOSIS — E785 Hyperlipidemia, unspecified: Secondary | ICD-10-CM | POA: Diagnosis not present

## 2017-09-08 DIAGNOSIS — R05 Cough: Secondary | ICD-10-CM | POA: Diagnosis not present

## 2017-09-08 DIAGNOSIS — I2510 Atherosclerotic heart disease of native coronary artery without angina pectoris: Secondary | ICD-10-CM | POA: Diagnosis not present

## 2017-09-08 DIAGNOSIS — I10 Essential (primary) hypertension: Secondary | ICD-10-CM | POA: Diagnosis not present

## 2017-09-09 DIAGNOSIS — Z48812 Encounter for surgical aftercare following surgery on the circulatory system: Secondary | ICD-10-CM | POA: Diagnosis not present

## 2017-09-09 DIAGNOSIS — Z955 Presence of coronary angioplasty implant and graft: Secondary | ICD-10-CM | POA: Diagnosis not present

## 2017-09-09 DIAGNOSIS — I2510 Atherosclerotic heart disease of native coronary artery without angina pectoris: Secondary | ICD-10-CM | POA: Diagnosis not present

## 2017-09-11 DIAGNOSIS — I2510 Atherosclerotic heart disease of native coronary artery without angina pectoris: Secondary | ICD-10-CM | POA: Diagnosis not present

## 2017-09-11 DIAGNOSIS — Z48812 Encounter for surgical aftercare following surgery on the circulatory system: Secondary | ICD-10-CM | POA: Diagnosis not present

## 2017-09-11 DIAGNOSIS — Z955 Presence of coronary angioplasty implant and graft: Secondary | ICD-10-CM | POA: Diagnosis not present

## 2017-09-15 DIAGNOSIS — Z48812 Encounter for surgical aftercare following surgery on the circulatory system: Secondary | ICD-10-CM | POA: Diagnosis not present

## 2017-09-15 DIAGNOSIS — Z955 Presence of coronary angioplasty implant and graft: Secondary | ICD-10-CM | POA: Diagnosis not present

## 2017-09-15 DIAGNOSIS — I2510 Atherosclerotic heart disease of native coronary artery without angina pectoris: Secondary | ICD-10-CM | POA: Diagnosis not present

## 2017-09-16 DIAGNOSIS — Z955 Presence of coronary angioplasty implant and graft: Secondary | ICD-10-CM | POA: Diagnosis not present

## 2017-09-16 DIAGNOSIS — Z48812 Encounter for surgical aftercare following surgery on the circulatory system: Secondary | ICD-10-CM | POA: Diagnosis not present

## 2017-09-16 DIAGNOSIS — I2510 Atherosclerotic heart disease of native coronary artery without angina pectoris: Secondary | ICD-10-CM | POA: Diagnosis not present

## 2017-09-17 ENCOUNTER — Encounter: Payer: Self-pay | Admitting: Family Medicine

## 2017-09-17 ENCOUNTER — Ambulatory Visit (INDEPENDENT_AMBULATORY_CARE_PROVIDER_SITE_OTHER): Payer: Medicare Other | Admitting: Family Medicine

## 2017-09-17 VITALS — BP 125/68 | HR 93 | Temp 98.6°F | Resp 16 | Ht 61.81 in | Wt 134.0 lb

## 2017-09-17 DIAGNOSIS — F5104 Psychophysiologic insomnia: Secondary | ICD-10-CM | POA: Diagnosis not present

## 2017-09-17 DIAGNOSIS — J9801 Acute bronchospasm: Secondary | ICD-10-CM | POA: Diagnosis not present

## 2017-09-17 DIAGNOSIS — I2510 Atherosclerotic heart disease of native coronary artery without angina pectoris: Secondary | ICD-10-CM | POA: Diagnosis not present

## 2017-09-17 DIAGNOSIS — J029 Acute pharyngitis, unspecified: Secondary | ICD-10-CM

## 2017-09-17 DIAGNOSIS — J069 Acute upper respiratory infection, unspecified: Secondary | ICD-10-CM | POA: Diagnosis not present

## 2017-09-17 LAB — POCT RAPID STREP A (OFFICE): RAPID STREP A SCREEN: NEGATIVE

## 2017-09-17 MED ORDER — PREDNISONE 20 MG PO TABS
ORAL_TABLET | ORAL | 0 refills | Status: DC
Start: 2017-09-17 — End: 2017-09-17

## 2017-09-17 MED ORDER — NITROGLYCERIN 0.4 MG SL SUBL: 0.4000 mg | SUBLINGUAL_TABLET | SUBLINGUAL | 12 refills | Status: DC | PRN

## 2017-09-17 MED ORDER — ALPRAZOLAM 0.25 MG PO TABS: 0.2500 mg | ORAL_TABLET | Freq: Every day | ORAL | 0 refills | Status: DC | PRN

## 2017-09-17 MED ORDER — LEVALBUTEROL HCL 0.63 MG/3ML IN NEBU: 0.6300 mg | INHALATION_SOLUTION | Freq: Once | RESPIRATORY_TRACT | 12 refills | Status: DC

## 2017-09-17 MED ORDER — DOXYCYCLINE HYCLATE 100 MG PO CAPS: 100.0000 mg | ORAL_CAPSULE | Freq: Two times a day (BID) | ORAL | 0 refills | Status: DC

## 2017-09-17 MED ORDER — LEVALBUTEROL HCL 0.63 MG/3ML IN NEBU
0.6300 mg | INHALATION_SOLUTION | Freq: Once | RESPIRATORY_TRACT | Status: AC
  Administered 2017-09-17: 0.63 mg via RESPIRATORY_TRACT

## 2017-09-17 MED ORDER — BENZONATATE 100 MG PO CAPS: 100.0000 mg | ORAL_CAPSULE | Freq: Three times a day (TID) | ORAL | 0 refills | Status: DC | PRN

## 2017-09-17 MED ORDER — PREDNISONE 20 MG PO TABS: ORAL_TABLET | ORAL | 0 refills | Status: DC

## 2017-09-17 NOTE — Progress Notes (Signed)
Subjective:    Patient ID: Kristine [name], female    DOB: 1941-06-03, 76 y.o.   MRN: [id number]  09/17/2017  Sore Throat (with fatigue ) and Cough (with some nasal drainage and mucus x 6 week )    HPI This 76 y.o. female presents for sore throat, cough for six weeks.  Went to [facility] on 08/16/17; prescribed Flonase, Zithromax. Went to [place] when retired.  No heat in [facility] until November.  Froze the entire time.   Called [facility], really congested; called in Augmentin without improvement. On 09/02/17 went back to [facility] and  prescribed Prednisone.  CXR negative.  CXR 09/08/17 WNL. Robitussin for cough.    Coughing up tons of green and then yellow sputum.  At night, feels fine; every morning, coughing up congestion.  Then had a sore throat on R side of throat. Unable to swallow. Really tired and worn out.   No wheezing.   BP Readings from Last 3 Encounters:  09/17/17 125/68  07/03/17 (!) 152/84  06/16/17 126/70   Wt Readings from Last 3 Encounters:  09/17/17 134 lb (60.8 kg)  07/03/17 132 lb (59.9 kg)  06/16/17 131 lb (59.4 kg)   Immunization History  Administered Date(s) Administered  . Influenza,inj,Quad PF,6+ Mos 08/22/2013, 08/19/2014  . Pneumococcal Conjugate-13 11/23/2015    Review of Systems  Constitutional: Positive for fatigue. Negative for chills, diaphoresis and fever.  HENT: Positive for congestion, postnasal drip, rhinorrhea, sneezing, sore throat and voice change. Negative for ear pain, sinus pain, sinus pressure and trouble swallowing.   Eyes: Negative for visual disturbance.  Respiratory: Positive for cough. Negative for shortness of breath and wheezing.   Cardiovascular: Negative for chest pain, palpitations and leg swelling.  Gastrointestinal: Negative for abdominal pain, constipation, diarrhea, nausea and vomiting.  Endocrine: Negative for cold intolerance, heat intolerance, polydipsia, polyphagia and polyuria.  Neurological: Negative  for dizziness, tremors, seizures, syncope, facial asymmetry, speech difficulty, weakness, light-headedness, numbness and headaches.    Past Medical History:  Diagnosis Date  . Anemia   . Arthritis   . Barrett's esophagus    Stage B  . CAD (coronary artery disease)    a. s/p STEMI 7/14 >> LHC: LM 20, mLAD 30-40, small Dx 100 (L-L collats), OM1 90, OM2 30-40, mRCA 50-60, Lat/AL akinesis, EF 35-40% >> PCI: Promus DES to OM1;  b. Myoview 3/16: diaph atten, no ischemia, EF 59% (intermediate risk b/c BP drop 152>>137, but recovery BP 162 (tech error) >> med Rx continued  c. cath 05/30/17 s/p FFR guided PCI with DES to circumflex/first OM   . CREST variant of scleroderma ([place])   . Cystocele   . Diverticulitis   . GERD (gastroesophageal reflux disease)   . History of blood transfusion    hx of being Anti-K positive  . History of Doppler ultrasound    a. Carotid US 2/12: bilateral ICA < 50%  . Hyperlipidemia   . Hypertension   . Ischemic cardiomyopathy    a. EF 35-40% by LHC at time of MI;  b. Echo 7/14:  Mild focal basal septal hypertrophy, EF 50-55%, apical HK, grade 1 diastolic dysfunction, trivial AI   . Osteopenia 09/2012   T score -2.4 FRAX 13%/2.7%  per 10/15/2012 discussion plan repeat in 2 years  . Raynaud disease   . Rectocele    mild   Past Surgical History:  Procedure Laterality Date  . ABDOMINAL HYSTERECTOMY  1996   RSO for menorrhagia.  . CORONARY ANGIOPLASTY  WITH STENT PLACEMENT  06/21/2013  . CORONARY STENT INTERVENTION N/A 05/30/2017   Procedure: Coronary Stent Intervention;  Surgeon: [name], MD;  Location: [facility];  Service: Cardiovascular;  Laterality: N/A;  . FOOT SURGERY  2013   x2  . INTRAVASCULAR PRESSURE WIRE/FFR STUDY N/A 05/30/2017   Procedure: Intravascular Pressure Wire/FFR Study;  Surgeon: [name], MD;  Location: [facility];  Service: Cardiovascular;  Laterality: N/A;  . JOINT REPLACEMENT  9/10   rt total  . LEFT HEART CATH  AND CORONARY ANGIOGRAPHY N/A 05/30/2017   Procedure: Left Heart Cath and Coronary Angiography;  Surgeon: [name], MD;  Location: [place] [facility];  Service: Cardiovascular;  Laterality: N/A;  . nissen fundoplication  1610  . TONSILLECTOMY  1950  . TOTAL KNEE ARTHROPLASTY     right  . TUBAL LIGATION  1980   Allergies  Allergen Reactions  . Aspirin Other (See Comments)    Other reaction(s): Other Can take 81 mg but not aspirin based medications Pt is prone to bleeding, ok with low dose coated aspirin  . Pravastatin     Other reaction(s): Other Other reaction(s): Other (See Comments) elevated LFT'S, tolerates 1/2 dose of zetia and 1/2 dose of pravastatin  . Flagyl [Metronidazole]     headaches  . Nsaids Other (See Comments)    Pt is prone to bleeding  . Pravastatin Sodium     Other reaction(s): Other (See Comments) elevated LFT'S, tolerates 1/2 dose of zetia and 1/2 dose of pravastatin  . Statins Other (See Comments)    elevated LFT'S, tolerates 1/2 dose of zetia and 1/2 dose of pravastatin   Current Outpatient Prescriptions on File Prior to Visit  Medication Sig Dispense Refill  . acetaminophen (TYLENOL) 500 MG tablet Take 1,000 mg by mouth every 6 (six) hours as needed for headache.    Marland Kitchen aspirin EC 81 MG EC tablet Take 1 tablet (81 mg total) by mouth daily.    . clopidogrel (PLAVIX) 75 MG tablet Take 1 tablet (75 mg total) by mouth daily. 90 tablet 1  . ezetimibe (ZETIA) 10 MG tablet TAKE ONE-HALF TABLET BY MOUTH DAILY 15 tablet 3  . losartan (COZAAR) 50 MG tablet TAKE 1/2 TABLET BY MOUTH ONCE DAILY 15 tablet 6  . Multiple Vitamin (MULTIVITAMIN WITH MINERALS) TABS tablet Take 1 tablet by mouth daily.    . pantoprazole (PROTONIX) 40 MG tablet Take 40 mg by mouth daily.    . pravastatin (PRAVACHOL) 10 MG tablet Take 1 tablet (10 mg total) by mouth every evening. 30 tablet 5  . ranitidine (ZANTAC) 150 MG tablet Take 150 mg by mouth at bedtime.    . rifaximin (XIFAXAN) 550  MG TABS tablet Take 550 mg by mouth daily.     No current facility-administered medications on file prior to visit.    Social History   Social History  . Marital status: Divorced    Spouse name: N/A  . Number of children: N/A  . Years of education: N/A   Occupational History  . Not on file.   Social History Main Topics  . Smoking status: Never Smoker  . Smokeless tobacco: Never Used  . Alcohol use 0.0 oz/week     Comment: Occasional  . Drug use: No  . Sexual activity: No   Other Topics Concern  . Not on file   Social History Narrative   Exercise dancing 3 x times weekly for 1-2 hours   Family History  Problem Relation Age of Onset  . Cancer Mother        Pancreatic Cancer  . Hepatitis C Mother   . Heart disease Father        congestive heart failure  . Heart attack Father   . Panic disorder Daughter   . Asthma Maternal Grandmother   . Emphysema Maternal Grandmother   . Cancer Maternal Grandfather        lung  . Stroke Neg Hx        Objective:    BP 125/68   Pulse 93   Temp 98.6 F (37 C)   Resp 16   Ht 5' 1.81" (1.57 m)   Wt 134 lb (60.8 kg)   LMP 11/25/1994   SpO2 98%   BMI 24.66 kg/m  Physical Exam  Constitutional: She is oriented to person, place, and time. She appears well-developed and well-nourished. No distress.  HENT:  Head: Atraumatic. Macrocephalic.  Right Ear: Tympanic membrane, external ear and ear canal normal.  Left Ear: Tympanic membrane, external ear and ear canal normal.  Nose: Mucosal edema and rhinorrhea present. Right sinus exhibits maxillary sinus tenderness. Right sinus exhibits no frontal sinus tenderness. Left sinus exhibits no frontal sinus tenderness.  Mouth/Throat: Oropharynx is clear and moist.  Eyes: Pupils are equal, round, and reactive to light. Conjunctivae and EOM are normal.  Neck: Normal range of motion. Neck supple. Carotid bruit is not present. No thyromegaly present.  Cardiovascular: Normal rate, regular  rhythm, normal heart sounds and intact distal pulses.  Exam reveals no gallop and no friction rub.   No murmur heard. Pulmonary/Chest: Effort normal and breath sounds normal. She has no wheezes. She has no rales.  Abdominal: Soft. Bowel sounds are normal. She exhibits no distension and no mass. There is no tenderness. There is no rebound and no guarding.  Lymphadenopathy:    She has no cervical adenopathy.  Neurological: She is alert and oriented to person, place, and time. No cranial nerve deficit.  Skin: Skin is warm and dry. No rash noted. She is not diaphoretic. No erythema. No pallor.  Psychiatric: She has a normal mood and affect. Her behavior is normal.   No results found. Depression screen [facility] 2/9 09/17/2017 07/03/2017 06/16/2017 06/11/2017 08/08/2016  Decreased Interest 0 0 0 0 0  Down, Depressed, Hopeless 0 0 0 0 0  PHQ - 2 Score 0 0 0 0 0   Fall Risk  09/17/2017 07/03/2017 06/16/2017 06/11/2017 08/08/2016  Falls in the past year? No No No No No        Assessment & Plan:   1. Sore throat   2. Bronchospasm   3. Acute upper respiratory infection   4. Coronary artery disease involving native coronary artery of native heart without angina pectoris   5. Psychophysiological insomnia     -new onset acute illness six weeks ago; no improvement with zpack or augmentin. -s/p Xopenex neb in office. -rx for Doxycycline, Prednisone, Tessalon Perles  Provided. -refill of NTG provided per patient request. -refill of Xanax also provided for PRN use.   Orders Placed This Encounter  Procedures  . POCT rapid strep A   Meds ordered this encounter  Medications  . DISCONTD: levalbuterol (XOPENEX) 0.63 MG/3ML nebulizer solution    Sig: Take 3 mLs (0.63 mg total) by nebulization once.    Dispense:  3 mL    Refill:  12  . DISCONTD: doxycycline (VIBRAMYCIN) 100 MG capsule    Sig: Take 1 capsule (  100 mg total) by mouth 2 (two) times daily.    Dispense:  20 capsule    Refill:  0  . DISCONTD:  predniSONE (DELTASONE) 20 MG tablet    Sig: Two tablets daily x 5 days then one tablet daily x 5 days    Dispense:  15 tablet    Refill:  0  . DISCONTD: nitroGLYCERIN (NITROSTAT) 0.4 MG SL tablet    Sig: Place 1 tablet (0.4 mg total) under the tongue every 5 (five) minutes x 3 doses as needed for chest pain.    Dispense:  25 tablet    Refill:  12  . ALPRAZolam (XANAX) 0.25 MG tablet    Sig: Take 1 tablet (0.25 mg total) by mouth daily as needed for anxiety.    Dispense:  30 tablet    Refill:  0  . DISCONTD: benzonatate (TESSALON) 100 MG capsule    Sig: Take 1-2 capsules (100-200 mg total) by mouth 3 (three) times daily as needed for cough.    Dispense:  40 capsule    Refill:  0  . predniSONE (DELTASONE) 20 MG tablet    Sig: Two tablets daily x 5 days then one tablet daily x 5 days    Dispense:  15 tablet    Refill:  0  . benzonatate (TESSALON) 100 MG capsule    Sig: Take 1-2 capsules (100-200 mg total) by mouth 3 (three) times daily as needed for cough.    Dispense:  40 capsule    Refill:  0  . doxycycline (VIBRAMYCIN) 100 MG capsule    Sig: Take 1 capsule (100 mg total) by mouth 2 (two) times daily.    Dispense:  20 capsule    Refill:  0  . levalbuterol (XOPENEX) nebulizer solution 0.63 mg  . nitroGLYCERIN (NITROSTAT) 0.4 MG SL tablet    Sig: Place 1 tablet (0.4 mg total) under the tongue every 5 (five) minutes x 3 doses as needed for chest pain.    Dispense:  25 tablet    Refill:  12    No Follow-up on file.   Aquilla Voiles Elayne Guerin, M.D. Primary Care at [facility] previously [facility] [address] [place], [place]  [address] [phone] phone [phone] fax

## 2017-09-17 NOTE — Patient Instructions (Addendum)
  Start Mucinex twice daily. Restart Flonase daily. Take Tessalon Perles 100mg  1-2 capsules three times daily as needed for cough.    IF you received an x-ray today, you will receive an invoice from [facility]. Please contact [facility] at [phone] with questions or concerns regarding your invoice.   IF you received labwork today, you will receive an invoice from [place]. Please contact [facility] at [phone] with questions or concerns regarding your invoice.   Our billing staff will not be able to assist you with questions regarding bills from these companies.  You will be contacted with the lab results as soon as they are available. The fastest way to get your results is to activate your My Chart account. Instructions are located on the last page of this paperwork. If you have not heard from [place] regarding the results in 2 weeks, please contact this office.

## 2017-09-18 DIAGNOSIS — Z955 Presence of coronary angioplasty implant and graft: Secondary | ICD-10-CM | POA: Diagnosis not present

## 2017-09-18 DIAGNOSIS — Z48812 Encounter for surgical aftercare following surgery on the circulatory system: Secondary | ICD-10-CM | POA: Diagnosis not present

## 2017-09-18 DIAGNOSIS — I2510 Atherosclerotic heart disease of native coronary artery without angina pectoris: Secondary | ICD-10-CM | POA: Diagnosis not present

## 2017-09-22 DIAGNOSIS — Z48812 Encounter for surgical aftercare following surgery on the circulatory system: Secondary | ICD-10-CM | POA: Diagnosis not present

## 2017-09-22 DIAGNOSIS — I2510 Atherosclerotic heart disease of native coronary artery without angina pectoris: Secondary | ICD-10-CM | POA: Diagnosis not present

## 2017-09-22 DIAGNOSIS — Z955 Presence of coronary angioplasty implant and graft: Secondary | ICD-10-CM | POA: Diagnosis not present

## 2017-09-24 DIAGNOSIS — F5104 Psychophysiologic insomnia: Secondary | ICD-10-CM | POA: Insufficient documentation

## 2017-09-25 DIAGNOSIS — Z48812 Encounter for surgical aftercare following surgery on the circulatory system: Secondary | ICD-10-CM | POA: Diagnosis not present

## 2017-09-25 DIAGNOSIS — I2510 Atherosclerotic heart disease of native coronary artery without angina pectoris: Secondary | ICD-10-CM | POA: Diagnosis not present

## 2017-09-25 DIAGNOSIS — Z955 Presence of coronary angioplasty implant and graft: Secondary | ICD-10-CM | POA: Diagnosis not present

## 2017-09-29 DIAGNOSIS — Z955 Presence of coronary angioplasty implant and graft: Secondary | ICD-10-CM | POA: Diagnosis not present

## 2017-09-29 DIAGNOSIS — Z48812 Encounter for surgical aftercare following surgery on the circulatory system: Secondary | ICD-10-CM | POA: Diagnosis not present

## 2017-09-29 DIAGNOSIS — I2510 Atherosclerotic heart disease of native coronary artery without angina pectoris: Secondary | ICD-10-CM | POA: Diagnosis not present

## 2017-09-30 DIAGNOSIS — Z955 Presence of coronary angioplasty implant and graft: Secondary | ICD-10-CM | POA: Diagnosis not present

## 2017-09-30 DIAGNOSIS — Z48812 Encounter for surgical aftercare following surgery on the circulatory system: Secondary | ICD-10-CM | POA: Diagnosis not present

## 2017-09-30 DIAGNOSIS — I2510 Atherosclerotic heart disease of native coronary artery without angina pectoris: Secondary | ICD-10-CM | POA: Diagnosis not present

## 2017-10-02 DIAGNOSIS — I2510 Atherosclerotic heart disease of native coronary artery without angina pectoris: Secondary | ICD-10-CM | POA: Diagnosis not present

## 2017-10-02 DIAGNOSIS — Z955 Presence of coronary angioplasty implant and graft: Secondary | ICD-10-CM | POA: Diagnosis not present

## 2017-10-02 DIAGNOSIS — Z48812 Encounter for surgical aftercare following surgery on the circulatory system: Secondary | ICD-10-CM | POA: Diagnosis not present

## 2017-10-06 DIAGNOSIS — I2510 Atherosclerotic heart disease of native coronary artery without angina pectoris: Secondary | ICD-10-CM | POA: Diagnosis not present

## 2017-10-06 DIAGNOSIS — E785 Hyperlipidemia, unspecified: Secondary | ICD-10-CM | POA: Diagnosis not present

## 2017-10-06 DIAGNOSIS — S46911A Strain of unspecified muscle, fascia and tendon at shoulder and upper arm level, right arm, initial encounter: Secondary | ICD-10-CM | POA: Diagnosis not present

## 2017-10-06 DIAGNOSIS — I10 Essential (primary) hypertension: Secondary | ICD-10-CM | POA: Diagnosis not present

## 2017-10-06 DIAGNOSIS — K219 Gastro-esophageal reflux disease without esophagitis: Secondary | ICD-10-CM | POA: Diagnosis not present

## 2017-10-06 DIAGNOSIS — I252 Old myocardial infarction: Secondary | ICD-10-CM | POA: Diagnosis not present

## 2017-10-07 ENCOUNTER — Other Ambulatory Visit: Payer: Self-pay | Admitting: *Deleted

## 2017-10-07 MED ORDER — EZETIMIBE 10 MG PO TABS: 5.0000 mg | ORAL_TABLET | Freq: Every day | ORAL | 5 refills | Status: DC

## 2017-10-09 DIAGNOSIS — Z48812 Encounter for surgical aftercare following surgery on the circulatory system: Secondary | ICD-10-CM | POA: Diagnosis not present

## 2017-10-09 DIAGNOSIS — Z955 Presence of coronary angioplasty implant and graft: Secondary | ICD-10-CM | POA: Diagnosis not present

## 2017-10-09 DIAGNOSIS — I2510 Atherosclerotic heart disease of native coronary artery without angina pectoris: Secondary | ICD-10-CM | POA: Diagnosis not present

## 2017-10-20 DIAGNOSIS — I2510 Atherosclerotic heart disease of native coronary artery without angina pectoris: Secondary | ICD-10-CM | POA: Diagnosis not present

## 2017-10-20 DIAGNOSIS — Z955 Presence of coronary angioplasty implant and graft: Secondary | ICD-10-CM | POA: Diagnosis not present

## 2017-10-20 DIAGNOSIS — Z48812 Encounter for surgical aftercare following surgery on the circulatory system: Secondary | ICD-10-CM | POA: Diagnosis not present

## 2017-10-21 DIAGNOSIS — I2510 Atherosclerotic heart disease of native coronary artery without angina pectoris: Secondary | ICD-10-CM | POA: Diagnosis not present

## 2017-10-21 DIAGNOSIS — Z955 Presence of coronary angioplasty implant and graft: Secondary | ICD-10-CM | POA: Diagnosis not present

## 2017-10-21 DIAGNOSIS — Z48812 Encounter for surgical aftercare following surgery on the circulatory system: Secondary | ICD-10-CM | POA: Diagnosis not present

## 2017-10-22 ENCOUNTER — Ambulatory Visit: Payer: BLUE CROSS/BLUE SHIELD | Admitting: Cardiovascular Disease

## 2017-10-23 DIAGNOSIS — Z48812 Encounter for surgical aftercare following surgery on the circulatory system: Secondary | ICD-10-CM | POA: Diagnosis not present

## 2017-10-23 DIAGNOSIS — I2510 Atherosclerotic heart disease of native coronary artery without angina pectoris: Secondary | ICD-10-CM | POA: Diagnosis not present

## 2017-10-23 DIAGNOSIS — M67911 Unspecified disorder of synovium and tendon, right shoulder: Secondary | ICD-10-CM | POA: Diagnosis not present

## 2017-10-23 DIAGNOSIS — Z955 Presence of coronary angioplasty implant and graft: Secondary | ICD-10-CM | POA: Diagnosis not present

## 2017-10-27 DIAGNOSIS — I2510 Atherosclerotic heart disease of native coronary artery without angina pectoris: Secondary | ICD-10-CM | POA: Diagnosis not present

## 2017-10-27 DIAGNOSIS — Z955 Presence of coronary angioplasty implant and graft: Secondary | ICD-10-CM | POA: Diagnosis not present

## 2017-10-27 DIAGNOSIS — Z48812 Encounter for surgical aftercare following surgery on the circulatory system: Secondary | ICD-10-CM | POA: Diagnosis not present

## 2017-10-28 DIAGNOSIS — Z48812 Encounter for surgical aftercare following surgery on the circulatory system: Secondary | ICD-10-CM | POA: Diagnosis not present

## 2017-10-28 DIAGNOSIS — Z955 Presence of coronary angioplasty implant and graft: Secondary | ICD-10-CM | POA: Diagnosis not present

## 2017-10-28 DIAGNOSIS — I2510 Atherosclerotic heart disease of native coronary artery without angina pectoris: Secondary | ICD-10-CM | POA: Diagnosis not present

## 2017-10-29 ENCOUNTER — Telehealth: Payer: Self-pay | Admitting: Cardiovascular Disease

## 2017-10-29 DIAGNOSIS — K219 Gastro-esophageal reflux disease without esophagitis: Secondary | ICD-10-CM | POA: Diagnosis not present

## 2017-10-29 DIAGNOSIS — K449 Diaphragmatic hernia without obstruction or gangrene: Secondary | ICD-10-CM | POA: Diagnosis not present

## 2017-10-29 DIAGNOSIS — M341 CR(E)ST syndrome: Secondary | ICD-10-CM | POA: Diagnosis not present

## 2017-10-29 DIAGNOSIS — R140 Abdominal distension (gaseous): Secondary | ICD-10-CM | POA: Diagnosis not present

## 2017-10-29 DIAGNOSIS — I2510 Atherosclerotic heart disease of native coronary artery without angina pectoris: Secondary | ICD-10-CM | POA: Diagnosis not present

## 2017-10-29 NOTE — Telephone Encounter (Signed)
Patient called to report her HR has been higher than usual. Her HR has been in the upper 90s recently, when it was in the 80s.  When she goes to Cardiac Rehab she has been told that her HR "is like she's already been working out an hour." She states her BP is always fine.  She has no symptoms. She denies heart racing, CP, SOB. Reiterated to her that HR in the 90s is usually not dangerous, but to call back if it feels irregular or if symptoms occur. Scheduled patient for overdue OV tomorrow with Dr. Burt Knack to discuss concerns.  She was grateful for call and agrees with treatment plan.

## 2017-10-29 NOTE — Telephone Encounter (Signed)
Pt calling to say her HR is high last night 98 and today before she called 43, she takes Losartan 1/2 tab qd, her HR usually runs 88, has appt with [facility] 12[facility]13--was told during Cardiac Rehab that she was running high like she had exercised before she got there--pls advise [phone]

## 2017-10-30 ENCOUNTER — Ambulatory Visit (INDEPENDENT_AMBULATORY_CARE_PROVIDER_SITE_OTHER): Payer: Medicare Other | Admitting: Cardiovascular Disease

## 2017-10-30 ENCOUNTER — Encounter: Payer: Self-pay | Admitting: Cardiovascular Disease

## 2017-10-30 VITALS — BP 124/88 | HR 80 | Ht 61.5 in | Wt 132.8 lb

## 2017-10-30 DIAGNOSIS — I10 Essential (primary) hypertension: Secondary | ICD-10-CM | POA: Diagnosis not present

## 2017-10-30 DIAGNOSIS — E785 Hyperlipidemia, unspecified: Secondary | ICD-10-CM | POA: Diagnosis not present

## 2017-10-30 DIAGNOSIS — Z955 Presence of coronary angioplasty implant and graft: Secondary | ICD-10-CM | POA: Diagnosis not present

## 2017-10-30 DIAGNOSIS — I2510 Atherosclerotic heart disease of native coronary artery without angina pectoris: Secondary | ICD-10-CM | POA: Diagnosis not present

## 2017-10-30 DIAGNOSIS — Z48812 Encounter for surgical aftercare following surgery on the circulatory system: Secondary | ICD-10-CM | POA: Diagnosis not present

## 2017-10-30 MED ORDER — METOPROLOL SUCCINATE ER 25 MG PO TB24: 12.5000 mg | ORAL_TABLET | Freq: Every day | ORAL | 11 refills | Status: DC

## 2017-10-30 NOTE — Progress Notes (Signed)
Cardiology Office Note Date:  10/30/2017   ID:  Ammi Hutt, DOB 1941/04/14, MRN [id number]  PCP:  Wardell Honour, MD  Cardiologist:  [name], MD    Chief Complaint  Patient presents with  . Follow-up    CAD     History of Present Illness: Kristine [name] is a 76 y.o. female who presents for follow-up of CAD.   She's participating in cardiac rehab. Has noted over the past few weeks that her resting HR is 90's. This is unusual for her. She has no other associated symptoms and specifically denies CP, dyspnea, or palpitations. No lightheadedness, orthopnea, or PND.   Past Medical History:  Diagnosis Date  . Anemia   . Arthritis   . Barrett's esophagus    Stage B  . CAD (coronary artery disease)    a. s/p STEMI 7/14 >> LHC: LM 20, mLAD 30-40, small Dx 100 (L-L collats), OM1 90, OM2 30-40, mRCA 50-60, Lat/AL akinesis, EF 35-40% >> PCI: Promus DES to OM1;  b. Myoview 3/16: diaph atten, no ischemia, EF 59% (intermediate risk b/c BP drop 152>>137, but recovery BP 162 (tech error) >> med Rx continued  c. cath 05/30/17 s/p FFR guided PCI with DES to circumflex/first OM   . CREST variant of scleroderma ([place])   . Cystocele   . Diverticulitis   . GERD (gastroesophageal reflux disease)   . History of blood transfusion    hx of being Anti-K positive  . History of Doppler ultrasound    a. Carotid US 2/12: bilateral ICA < 50%  . Hyperlipidemia   . Hypertension   . Ischemic cardiomyopathy    a. EF 35-40% by LHC at time of MI;  b. Echo 7/14:  Mild focal basal septal hypertrophy, EF 50-55%, apical HK, grade 1 diastolic dysfunction, trivial AI   . Osteopenia 09/2012   T score -2.4 FRAX 13%/2.7%  per 10/15/2012 discussion plan repeat in 2 years  . Raynaud disease   . Rectocele    mild    Past Surgical History:  Procedure Laterality Date  . ABDOMINAL HYSTERECTOMY  1996   RSO for menorrhagia.  . CORONARY ANGIOPLASTY WITH STENT PLACEMENT  06/21/2013  . CORONARY STENT INTERVENTION N/A  05/30/2017   Procedure: Coronary Stent Intervention;  Surgeon: [name], MD;  Location: [facility];  Service: Cardiovascular;  Laterality: N/A;  . FOOT SURGERY  2013   x2  . INTRAVASCULAR PRESSURE WIRE/FFR STUDY N/A 05/30/2017   Procedure: Intravascular Pressure Wire/FFR Study;  Surgeon: [name], MD;  Location: [facility];  Service: Cardiovascular;  Laterality: N/A;  . JOINT REPLACEMENT  9/10   rt total  . LEFT HEART CATH AND CORONARY ANGIOGRAPHY N/A 05/30/2017   Procedure: Left Heart Cath and Coronary Angiography;  Surgeon: [name], MD;  Location: [facility];  Service: Cardiovascular;  Laterality: N/A;  . nissen fundoplication  7939  . TONSILLECTOMY  1950  . TOTAL KNEE ARTHROPLASTY     right  . TUBAL LIGATION  1980    Current Outpatient Medications  Medication Sig Dispense Refill  . acetaminophen (TYLENOL) 500 MG tablet Take 1,000 mg by mouth every 6 (six) hours as needed for headache.    . ALPRAZolam (XANAX) 0.25 MG tablet Take 1 tablet (0.25 mg total) by mouth daily as needed for anxiety. 30 tablet 0  . aspirin EC 81 MG EC tablet Take 1 tablet (81 mg total) by mouth daily.    . clopidogrel (PLAVIX)  75 MG tablet Take 1 tablet (75 mg total) by mouth daily. 90 tablet 1  . ezetimibe (ZETIA) 10 MG tablet Take 0.5 tablets (5 mg total) daily by mouth. 15 tablet 5  . losartan (COZAAR) 50 MG tablet TAKE 1/2 TABLET BY MOUTH ONCE DAILY 15 tablet 6  . nitroGLYCERIN (NITROSTAT) 0.4 MG SL tablet Place 1 tablet (0.4 mg total) under the tongue every 5 (five) minutes x 3 doses as needed for chest pain. 25 tablet 12  . pantoprazole (PROTONIX) 40 MG tablet Take 40 mg by mouth daily.    . pravastatin (PRAVACHOL) 10 MG tablet Take 1 tablet (10 mg total) by mouth every evening. 30 tablet 5  . ranitidine (ZANTAC) 150 MG tablet Take 150 mg by mouth at bedtime.    . metoprolol succinate (TOPROL XL) 25 MG 24 hr tablet Take 0.5 tablets (12.5 mg total) by mouth daily. 15  tablet 11   No current facility-administered medications for this visit.     Allergies:   Aspirin; Pravastatin; Flagyl [metronidazole]; Nsaids; Pravastatin sodium; and Statins   Social History:  The patient  reports that  has never smoked. she has never used smokeless tobacco. She reports that she drinks alcohol. She reports that she does not use drugs.   Family History:  The patient's family history includes Asthma in her maternal grandmother; Cancer in her maternal grandfather and mother; Emphysema in her maternal grandmother; Heart attack in her father; Heart disease in her father; Hepatitis C in her mother; Panic disorder in her daughter.    ROS:  Please see the history of present illness.  All other systems are reviewed and negative.    PHYSICAL EXAM: VS:  BP 124/88   Pulse 80   Ht 5' 1.5" (1.562 m)   Wt 132 lb 12.8 oz (60.2 kg)   LMP 11/25/1994   SpO2 99%   BMI 24.69 kg/m  , BMI Body mass index is 24.69 kg/m. GEN: Well nourished, well developed, in no acute distress  HEENT: normal  Neck: no JVD, no masses. No carotid bruits Cardiac: RRR without murmur or gallop                Respiratory:  clear to auscultation bilaterally, normal work of breathing GI: soft, nontender, nondistended, + BS MS: no deformity or atrophy  Ext: no pretibial edema, pedal pulses 2+= bilaterally Skin: warm and dry, no rash Neuro:  Strength and sensation are intact Psych: euthymic mood, full affect  Recent Labs: 04/28/2017: TSH 2.990 06/11/2017: Platelets 208 07/03/2017: ALT 14; BUN 10; Creatinine, Ser 0.73; Hemoglobin 11.3; Potassium 4.2; Sodium 141   Lipid Panel     Component Value Date/Time   CHOL 148 04/28/2017 1003   CHOL 153 07/29/2014 0926   TRIG 126 04/28/2017 1003   TRIG 112 07/29/2014 0926   HDL 45 04/28/2017 1003   HDL 49 07/29/2014 0926   CHOLHDL 3.3 04/28/2017 1003   CHOLHDL 2.6 11/23/2015 0912   VLDL 17 11/23/2015 0912   LDLCALC 78 04/28/2017 1003   LDLCALC 82 07/29/2014  0926      Wt Readings from Last 3 Encounters:  10/30/17 132 lb 12.8 oz (60.2 kg)  09/17/17 134 lb (60.8 kg)  07/03/17 132 lb (59.9 kg)     Cardiac Studies Reviewed: Cardiac Cath 05-30-2017: Conclusion   1. Single-vessel coronary artery disease with moderately severe proximal left circumflex stenosis at the proximal edge of the previously implanted stent 2. Mild nonobstructive LAD and RCA stenoses 3. Normal  LV systolic function based on noninvasive assessment 4. Successful FFR guided PCI with a 3.5 x 16 mm Promus DES in the proximal circumflex/first OM  Dual antiplatelet therapy with aspirin and Plavix for a minimum of 6 months  Indications   Abnormal stress echo [I95.18 (ICD-10-CM)]  Procedural Details/Technique   Technical Details INDICATION: Abnormal stress echo (intermediate risk) and exertional fatigue/intolerance CCS 2 symptoms  PROCEDURAL DETAILS: The right wrist was prepped, draped, and anesthetized with 1% lidocaine. Using the modified Seldinger technique, a 5/6 French Slender sheath was introduced into the right radial artery. 3 mg of verapamil was administered through the sheath, weight-based unfractionated heparin was administered intravenously. Standard Judkins catheters were used for selective coronary angiography. Left ventricular pressure is recorded with a JR4 catheter. Pressure wire analysis is performed after the diagnostic procedure. Additional heparin is administered. A therapeutic ACT is achieved. A 5 French EBU 3.0 guide catheter is used. A cougar wire is advanced across the lesion. An FFR catheter was advanced to the end of the guide and equalized per protocol. The catheter is then advanced past the lesion which is located at the proximal stent edge in the proximal circumflex. The resting FFR 0.96. The FFR at peak hyperemia with IV adenosine is 0.79. The patient is loaded with Plavix 600 mg. A 2.5 mm balloon is advanced over the cougar wire. The balloon is used to  predilate the lesion. The lesion is then stented with a 3.5 x 16 mm Promus DES. The stent appears well expanded at 12 atm pressure. I attempted to post dilate the lesion with a 3.75 mm noncompliant balloon but it would not advance across the angle into the proximal left circumflex/stent interface and further attempts were not made. Catheter exchanges were performed over an exchange length guidewire. There were no immediate procedural complications. A TR band was used for radial hemostasis at the completion of the procedure. The patient was transferred to the post catheterization recovery area for further monitoring.    Estimated blood loss <50 mL.  During this procedure the patient was administered the following to achieve and maintain moderate conscious sedation: Versed 2 mg, Fentanyl 25 mcg, while the patient's heart rate, blood pressure, and oxygen saturation were continuously monitored. The period of conscious sedation was 53 minutes, of which I was present face-to-face 100% of this time.  Coronary Findings   Diagnostic  Dominance: Right  Left Anterior Descending  Prox LAD to Mid LAD lesion 30% stenosed  diffuse nonobstructive plaquing  Left Circumflex  Prox Cx lesion 70% stenosed  lesion located at the proximal stent edge, eccentric lesion appears tighter than 70% in some views.  First Obtuse Marginal Branch  Ost 1st Mrg to 1st Mrg lesion 0% stenosed  Previously placed Ost 1st Mrg to 1st Mrg drug eluting stent is widely patent.  Right Coronary Artery  Mid RCA lesion 40% stenosed  unchanged from previous study  Intervention   Prox Cx lesion  Angioplasty  Lesion crossed with guidewire. Pre-stent angioplasty was performed. A STENT PROMUS PREM MR [id number] drug eluting stent was successfully placed, and overlaps previously placed stent. Post-stent angioplasty was not performed. The pre-interventional distal flow is normal (TIMI 3). The post-interventional distal flow is normal (TIMI 3). No  complications occurred at this lesion. This stent is implanted in overlapping fashion into the proximal edge of the previously implanted OM1 stent. The stent extends from the proximal circumflex and of the OM1 stent.  There is no residual stenosis post intervention.  Coronary  Diagrams   Diagnostic Diagram       Post-Intervention Diagram          ASSESSMENT AND PLAN: 1.  Coronary artery disease, native vessel, without angina: The patient appears stable.  Will continue dual antiplatelet therapy with aspirin and clopidogrel.  We will add low-dose Toprol-XL 12.5 mg daily to her regimen to see if this lowers her resting heart rate.  She monitors her blood pressure closely at home.  No other changes are recommended today.  2.  Hypertension: Blood pressure is well controlled on low-dose losartan.  She is taking 25 mg daily and sometimes skips the dose if her blood pressure is low.  She is not having lightheaded spells.  Will add metoprolol succinate 12.5 mg daily.  3.  Hyperlipidemia: Last lipids reviewed with the LDL cholesterol of 78 and total cholesterol of 148.  She continues on Zetia 10 mg and pravastatin 10 mg.  Current medicines are reviewed with the patient today.  The patient does not have concerns regarding medicines.  Labs/ tests ordered today include:  No orders of the defined types were placed in this encounter.   Disposition:   FU 6 months  Signed, [name], MD  10/30/2017 5:58 PM    [address], [place], [place]  [address] Phone: [phone]; Fax: [phone]

## 2017-10-30 NOTE — Patient Instructions (Signed)
Medication Instructions:  1) START TOPROL 12.5 mg daily  Labwork: None  Testing/Procedures: None  Follow-Up: Your provider wants you to follow-up in: 6 months with Dr. Burt Knack. You will receive a reminder letter in the mail two months in advance. If you don't receive a letter, please call our office to schedule the follow-up appointment.    Any Other Special Instructions Will Be Listed Below (If Applicable).     If you need a refill on your cardiac medications before your next appointment, please call your pharmacy.

## 2017-11-06 ENCOUNTER — Ambulatory Visit: Payer: BLUE CROSS/BLUE SHIELD | Admitting: Cardiovascular Disease

## 2017-11-07 ENCOUNTER — Telehealth: Payer: Self-pay | Admitting: Cardiovascular Disease

## 2017-11-07 NOTE — Telephone Encounter (Signed)
New Message    Pt c/o medication issue:  1. Name of Medication: Metoprolol   2. How are you currently taking this medication (dosage and times per day)? 25mg  1/2 tablet 1 time a day  3. Are you having a reaction (difficulty breathing--STAT)? no  4. What is your medication issue? Patient did not start medication after  reading the insert. Per the leaflet she read she is afraid she may have a heart attack if she misses a dosage. Please call to discuss.

## 2017-11-07 NOTE — Telephone Encounter (Signed)
Reiterated to patient that she is on the lowest dose of Toprol. It is not recommended for patients to stop abruptly, but patients are often titrated down to her dose (or even 25 mg) then stopped for safely. Reviewed how Toprol will help her elevated heart rate.  She will continue to monitor HR and BP and call if she has any problems. She was grateful for call and agrees with treatment plan.

## 2017-11-10 DIAGNOSIS — Z48812 Encounter for surgical aftercare following surgery on the circulatory system: Secondary | ICD-10-CM | POA: Diagnosis not present

## 2017-11-10 DIAGNOSIS — Z955 Presence of coronary angioplasty implant and graft: Secondary | ICD-10-CM | POA: Diagnosis not present

## 2017-11-10 DIAGNOSIS — I2510 Atherosclerotic heart disease of native coronary artery without angina pectoris: Secondary | ICD-10-CM | POA: Diagnosis not present

## 2017-11-11 DIAGNOSIS — M67911 Unspecified disorder of synovium and tendon, right shoulder: Secondary | ICD-10-CM | POA: Diagnosis not present

## 2017-11-12 DIAGNOSIS — H35351 Cystoid macular degeneration, right eye: Secondary | ICD-10-CM | POA: Diagnosis not present

## 2017-11-12 DIAGNOSIS — H35072 Retinal telangiectasis, left eye: Secondary | ICD-10-CM | POA: Diagnosis not present

## 2017-11-12 DIAGNOSIS — H35352 Cystoid macular degeneration, left eye: Secondary | ICD-10-CM | POA: Diagnosis not present

## 2017-11-12 DIAGNOSIS — H35071 Retinal telangiectasis, right eye: Secondary | ICD-10-CM | POA: Diagnosis not present

## 2017-11-16 ENCOUNTER — Other Ambulatory Visit: Payer: Self-pay | Admitting: Cardiology

## 2017-11-16 ENCOUNTER — Other Ambulatory Visit: Payer: Self-pay | Admitting: Cardiovascular Disease

## 2017-11-16 DIAGNOSIS — I10 Essential (primary) hypertension: Secondary | ICD-10-CM

## 2017-11-19 ENCOUNTER — Encounter: Payer: Self-pay | Admitting: Gynecology

## 2017-11-19 ENCOUNTER — Ambulatory Visit (INDEPENDENT_AMBULATORY_CARE_PROVIDER_SITE_OTHER): Payer: Medicare Other | Admitting: Gynecology

## 2017-11-19 VITALS — BP 126/82

## 2017-11-19 DIAGNOSIS — N8111 Cystocele, midline: Secondary | ICD-10-CM | POA: Diagnosis not present

## 2017-11-19 DIAGNOSIS — N950 Postmenopausal bleeding: Secondary | ICD-10-CM

## 2017-11-19 DIAGNOSIS — I2510 Atherosclerotic heart disease of native coronary artery without angina pectoris: Secondary | ICD-10-CM | POA: Diagnosis not present

## 2017-11-19 NOTE — Progress Notes (Signed)
    Kristine [name] [birth year] [id number]        76 y.o.  Kristine [name] complaining of vaginal bleeding.  Has a history of large cystocele.  History of TAH in the past.  Has had episodes of vaginal bleeding before attributable to atrophic changes and cystocele irritation.  Had trial of pessary but did not tolerate this and has decided to put up with her cystocele symptoms of bulging.  Has not been seen in the office in several years.  Does have annual full exam scheduled coming up.  Past medical history,surgical history, problem list, medications, allergies, family history and social history were all reviewed and documented in the EPIC chart.  Directed ROS with pertinent positives and negatives documented in the history of present illness/assessment and plan.  Exam: Kristine [name] assistant Vitals:   11/19/17 1505  BP: 126/82   General appearance:  Normal Abdomen soft nontender without masses guarding rebound Pelvic external BUS vagina with atrophic changes.  Second-degree cystocele noted.  Cuff well suspended.  No areas of active bleeding noted.  Proctoswab manipulation showed no bleeding on the proctoscopy swab.  Rectal exam with old external hemorrhoids but no evidence of fissure or active bleeding.  Digital exam is negative.  Assessment/Plan:  76 y.o. G1P1 with history of vaginal bleeding of the last several months but notes for the last 2 weeks is done no bleeding at all.  Exam shows atrophic changes but no areas of active bleeding.  Digital exam palpates normal.  Recommended patient follow-up for her scheduled annual exam and represent at any time acutely when she has bleeding so that I can try to identify the area while she is bleeding.  Suspect that she is having some atrophic irritation of the vaginal mucosa contributed to by her cystocele with probable rubbing leading to this bleeding.    Kristine Auerbach MD, 3:37 PM 11/19/2017

## 2017-11-19 NOTE — Patient Instructions (Signed)
Follow-up whenever you are having the bleeding so I can examine you at that time.

## 2017-12-01 ENCOUNTER — Encounter: Payer: Self-pay | Admitting: Gynecology

## 2017-12-01 ENCOUNTER — Ambulatory Visit (INDEPENDENT_AMBULATORY_CARE_PROVIDER_SITE_OTHER): Payer: Medicare Other | Admitting: Gynecology

## 2017-12-01 VITALS — BP 120/78 | Ht 61.0 in | Wt 131.0 lb

## 2017-12-01 DIAGNOSIS — Z1272 Encounter for screening for malignant neoplasm of vagina: Secondary | ICD-10-CM | POA: Diagnosis not present

## 2017-12-01 DIAGNOSIS — M8580 Other specified disorders of bone density and structure, unspecified site: Secondary | ICD-10-CM

## 2017-12-01 DIAGNOSIS — N952 Postmenopausal atrophic vaginitis: Secondary | ICD-10-CM

## 2017-12-01 DIAGNOSIS — Z01411 Encounter for gynecological examination (general) (routine) with abnormal findings: Secondary | ICD-10-CM | POA: Diagnosis not present

## 2017-12-01 DIAGNOSIS — N8111 Cystocele, midline: Secondary | ICD-10-CM

## 2017-12-01 NOTE — Progress Notes (Signed)
    Kristine [name] November 11, 1941 [id number]        76 y.o.  G1P1 for breast and pelvic exam.  Doing well without gynecologic complaints.  History of some bleeding in December but reports no further bleeding.  Past medical history,surgical history, problem list, medications, allergies, family history and social history were all reviewed and documented as reviewed in the EPIC chart.  ROS:  Performed with pertinent positives and negatives included in the history, assessment and plan.   Additional significant findings : None   Exam: Caryn Bee assistant Vitals:   12/01/17 1051  BP: 120/78  Weight: 131 lb (59.4 kg)  Height: 5\' 1"  (1.549 m)   Body mass index is 24.75 kg/m.  General appearance:  Normal affect, orientation and appearance. Skin: Grossly normal HEENT: Without gross lesions.  No cervical or supraclavicular adenopathy. Thyroid normal.  Lungs:  Clear without wheezing, rales or rhonchi Cardiac: RR, without RMG Abdominal:  Soft, nontender, without masses, guarding, rebound, organomegaly or hernia Breasts:  Examined lying and sitting without masses, retractions, discharge or axillary adenopathy. Pelvic:  Ext, BUS, Vagina: With atrophic changes.  Second-degree cystocele.  Cuff well supported.  No significant rectocele.  Pap smear done  Adnexa: Without masses or tenderness    Anus and perineum: Normal   Rectovaginal: Normal sphincter tone without palpated masses or tenderness.    Assessment/Plan:  77 y.o. G1P1 female for breast and pelvic exam.   1. Postmenopausal/atrophic genital changes.  No significant hot flushes, night sweats or any vaginal dryness. 2. History of vaginal bleeding status post TAH in the past.  Thought to be secondary to irritative changes with her cystocele.  No bleeding since December.  Exam at that point was negative.  Patient will follow-up if she does any further bleeding ASAP while she is bleeding so we can examine her to see if we can locate the  source. 3. Pap smear 2012.  Pap smear done today due to her history of bleeding.  No history of abnormal Pap smears previously. 4. Osteopenia.  DEXA 2013 T score -2.4.  FRAX 13% / 2.7%.  Had discussed treatment options previously but she declined any options for treatment.  Recommend follow-up DEXA now and she agrees to schedule. 5. Mammography due now and patient is going to call and schedule.  Names and numbers provided.  Breast exam normal today.  SBE monthly reviewed. 6. Colonoscopy 2016.  Repeat at their recommended interval. 7. Health maintenance.  No routine lab work done as patient does this elsewhere.  Follow-up for bone density.  Follow-up if any further bleeding.  Follow-up in 1 year for annual exam.   Anastasio Auerbach MD, 11:22 AM 12/01/2017

## 2017-12-01 NOTE — Addendum Note (Signed)
Addended by: Nelva Nay on: 12/01/2017 11:55 AM   Modules accepted: Orders

## 2017-12-01 NOTE — Patient Instructions (Addendum)
Follow up for bone density as scheduled Follow up if any further bleeding   Call to Schedule your mammogram  [facility]. [address] [place] Phone: [phone]

## 2017-12-02 LAB — PAP IG W/ RFLX HPV ASCU

## 2017-12-04 ENCOUNTER — Other Ambulatory Visit: Payer: Self-pay | Admitting: Gynecology

## 2017-12-04 DIAGNOSIS — Z1231 Encounter for screening mammogram for malignant neoplasm of breast: Secondary | ICD-10-CM

## 2017-12-18 DIAGNOSIS — Z23 Encounter for immunization: Secondary | ICD-10-CM | POA: Diagnosis not present

## 2017-12-18 DIAGNOSIS — L821 Other seborrheic keratosis: Secondary | ICD-10-CM | POA: Diagnosis not present

## 2017-12-18 DIAGNOSIS — D1801 Hemangioma of skin and subcutaneous tissue: Secondary | ICD-10-CM | POA: Diagnosis not present

## 2017-12-18 DIAGNOSIS — D485 Neoplasm of uncertain behavior of skin: Secondary | ICD-10-CM | POA: Diagnosis not present

## 2017-12-18 DIAGNOSIS — D3612 Benign neoplasm of peripheral nerves and autonomic nervous system, upper limb, including shoulder: Secondary | ICD-10-CM | POA: Diagnosis not present

## 2017-12-18 DIAGNOSIS — D2262 Melanocytic nevi of left upper limb, including shoulder: Secondary | ICD-10-CM | POA: Diagnosis not present

## 2017-12-18 DIAGNOSIS — L814 Other melanin hyperpigmentation: Secondary | ICD-10-CM | POA: Diagnosis not present

## 2017-12-24 ENCOUNTER — Ambulatory Visit: Payer: PRIVATE HEALTH INSURANCE

## 2017-12-26 DIAGNOSIS — M810 Age-related osteoporosis without current pathological fracture: Secondary | ICD-10-CM

## 2017-12-26 HISTORY — DX: Age-related osteoporosis without current pathological fracture: M81.0

## 2017-12-29 ENCOUNTER — Ambulatory Visit
Admission: RE | Admit: 2017-12-29 | Discharge: 2017-12-29 | Disposition: A | Discharge status: Home / Self Care | Payer: Medicare Other | Source: Ambulatory Visit | Attending: Gynecology | Admitting: Gynecology

## 2017-12-29 DIAGNOSIS — Z1231 Encounter for screening mammogram for malignant neoplasm of breast: Secondary | ICD-10-CM | POA: Diagnosis not present

## 2018-01-04 DIAGNOSIS — R6889 Other general symptoms and signs: Secondary | ICD-10-CM | POA: Diagnosis not present

## 2018-01-04 DIAGNOSIS — I10 Essential (primary) hypertension: Secondary | ICD-10-CM | POA: Diagnosis not present

## 2018-01-04 DIAGNOSIS — J101 Influenza due to other identified influenza virus with other respiratory manifestations: Secondary | ICD-10-CM | POA: Diagnosis not present

## 2018-01-05 ENCOUNTER — Telehealth: Payer: Self-pay | Admitting: Cardiovascular Disease

## 2018-01-05 NOTE — Telephone Encounter (Signed)
New message       [facility] Pre-operative Risk Assessment    Request for surgical clearance:  1. What type of surgery is being performed? UPPER ENDO  2. When is this surgery scheduled? TBD  3. What type of clearance is required (medical clearance vs. Pharmacy clearance to hold med vs. Both)? BOTH  4. Are there any medications that need to be held prior to surgery and how long?PLAVIX  5. Practice name and name of physician performing surgery? DR Adria Devon  6. What is your office phone and fax number? Joylene Igo [phone] , PHONE [phone]  7. Anesthesia type (None, local, MAC, general) ? [place] 01/05/2018, 12:44 PM  _________________________________________________________________   (provider comments below)

## 2018-01-05 NOTE — Telephone Encounter (Signed)
Dr. Burt Knack, please respond back to CV DIV PREOP POOL. Not the callback. Sorry. Thanks.

## 2018-01-05 NOTE — Telephone Encounter (Signed)
   Dr. Burt Knack, this pt needs an EGD. Dr. Adria Devon would like to know if Plavix can be held. Pt had DES 05/30/17. Your note states to treat with DAPT for a minimum of 6 months. Are you ok with pt holding or would you prefer for her to continue for longer? Please respond back to CV DIV PREOP CALLBACK. Thanks.

## 2018-01-08 NOTE — Telephone Encounter (Signed)
I will route this recommendations to requesting provider.

## 2018-01-08 NOTE — Telephone Encounter (Signed)
Would be ok for her to hold plavix for EGD. thx

## 2018-01-20 DIAGNOSIS — M25511 Pain in right shoulder: Secondary | ICD-10-CM | POA: Diagnosis not present

## 2018-01-22 ENCOUNTER — Ambulatory Visit (INDEPENDENT_AMBULATORY_CARE_PROVIDER_SITE_OTHER): Payer: Medicare Other

## 2018-01-22 ENCOUNTER — Other Ambulatory Visit: Payer: Self-pay | Admitting: Gynecology

## 2018-01-22 DIAGNOSIS — M810 Age-related osteoporosis without current pathological fracture: Secondary | ICD-10-CM

## 2018-01-22 DIAGNOSIS — M8580 Other specified disorders of bone density and structure, unspecified site: Secondary | ICD-10-CM

## 2018-01-23 ENCOUNTER — Encounter: Payer: Self-pay | Admitting: Gynecology

## 2018-01-23 ENCOUNTER — Other Ambulatory Visit: Payer: Self-pay | Admitting: Cardiology

## 2018-01-23 ENCOUNTER — Telehealth: Payer: Self-pay | Admitting: Gynecology

## 2018-01-23 NOTE — Telephone Encounter (Signed)
Shows osteoporosis.  It is time that she seriously consider starting medication.  Recommend office visit to discuss treatment options.

## 2018-01-27 DIAGNOSIS — H8001 Otosclerosis involving oval window, nonobliterative, right ear: Secondary | ICD-10-CM | POA: Diagnosis not present

## 2018-01-27 DIAGNOSIS — H9071 Mixed conductive and sensorineural hearing loss, unilateral, right ear, with unrestricted hearing on the contralateral side: Secondary | ICD-10-CM | POA: Diagnosis not present

## 2018-01-27 DIAGNOSIS — M67912 Unspecified disorder of synovium and tendon, left shoulder: Secondary | ICD-10-CM | POA: Diagnosis not present

## 2018-01-27 DIAGNOSIS — M75122 Complete rotator cuff tear or rupture of left shoulder, not specified as traumatic: Secondary | ICD-10-CM | POA: Diagnosis not present

## 2018-01-27 NOTE — Telephone Encounter (Signed)
Pt informed, transferred to front desk to schedule.

## 2018-01-28 ENCOUNTER — Encounter: Payer: Self-pay | Admitting: Gynecology

## 2018-01-28 ENCOUNTER — Telehealth: Payer: Self-pay | Admitting: *Deleted

## 2018-01-28 ENCOUNTER — Ambulatory Visit (INDEPENDENT_AMBULATORY_CARE_PROVIDER_SITE_OTHER): Payer: Medicare Other | Admitting: Gynecology

## 2018-01-28 VITALS — BP 124/80

## 2018-01-28 DIAGNOSIS — M810 Age-related osteoporosis without current pathological fracture: Secondary | ICD-10-CM

## 2018-01-28 NOTE — Telephone Encounter (Signed)
-----   Message from Alen Blew, [place] sent at 01/28/2018 11:01 AM EST ----- Regarding: Prolia New start for TF See below  ----- Message ----- From: Anastasio Auerbach, MD Sent: 01/28/2018   9:55 AM To: Alen Blew, CMA  Sharrie Rothman, can you forward this to whoever does Prolia precertification.  I think it is now [employer].  I would like to initiate this patient on Prolia for osteoporosis.  Contraindications to bisphosphate's to include GERD, ischemic colitis with

## 2018-01-28 NOTE — Patient Instructions (Signed)
This will contact you in reference to arranging for the Prolia for the osteoporosis.  Call my office if you do not hear from [place] within 2 weeks.

## 2018-01-28 NOTE — Progress Notes (Signed)
    Kristine [name] 11/13/1941 [id number]        77 y.o.  G1P1 presents to discuss her most recent bone density which shows osteoporosis T score -2.6.  Spine has been excluded due to scoliosis and degenerative changes.  She is in the -2+ range at all other measurements to include right and left hip as well as left forearm.  Is active as far as weightbearing exercise with yoga and walking.  Has a history of fracture of her knee at age 30 from a fall.  Past medical history,surgical history, problem list, medications, allergies, family history and social history were all reviewed and documented in the EPIC chart.  Directed ROS with pertinent positives and negatives documented in the history of present illness/assessment and plan.  Exam: Vitals:   01/28/18 0856  BP: 124/80   General appearance:  Normal   Assessment/Plan:  77 y.o. G1P1 with osteoporosis on DEXA.  I reviewed the diagnoses and we reviewed the DEXA report in detail.  I reviewed her increased risk of fracture and the issues and sequelae of fracture.  She is active as far as walking and doing yoga.  Has a history of fracture after a fall at age 56.  No other significant risk factors as far as not smoking or excessive alcohol.  No significant family history of osteoporosis.  I reviewed treatment options with the patient to include bisphosphate such as Fosamax Actonel Boniva and Reclast.  I also discussed Prolia Evista and Forteo.  The pros and cons of each choice as well as the risks and benefits of each choice were discussed with the patient.  We discussed the issues of GERD, gastrointestinal issues, osteonecrosis of the jaw, atypical fractures, rashes, infections, thrombosis and sarcoma.  She does have GERD and issues with reflux as well as ischemic colitis.  I reviewed with her that this may be an issue with oral bisphosphate's.  Do not feel ischemic colitis and absolute contraindication to Prolia and that is not infectious but related to  vascular.  Would like to pursue Prolia and will go ahead and look at her coverage and if possible will initiate this for her.  If not then will rediscuss treatment options.  Patient's questions were answered and she was provided with a copy of her report.  Greater than 50% of my 25-minute office visit was spent in direct face to face counseling and coordination of care with the patient.     Kristine Auerbach MD, 9:50 AM 01/28/2018

## 2018-01-29 ENCOUNTER — Other Ambulatory Visit: Payer: Self-pay | Admitting: Gynecology

## 2018-01-29 ENCOUNTER — Encounter: Payer: Self-pay | Admitting: Gynecology

## 2018-01-29 ENCOUNTER — Ambulatory Visit (INDEPENDENT_AMBULATORY_CARE_PROVIDER_SITE_OTHER): Payer: Medicare Other | Admitting: Gynecology

## 2018-01-29 DIAGNOSIS — E559 Vitamin D deficiency, unspecified: Secondary | ICD-10-CM

## 2018-01-29 DIAGNOSIS — M810 Age-related osteoporosis without current pathological fracture: Secondary | ICD-10-CM | POA: Diagnosis not present

## 2018-01-29 LAB — VITAMIN D 25 HYDROXY (VIT D DEFICIENCY, FRACTURES): Vit D, 25-Hydroxy: 27 ng/mL — ABNORMAL LOW (ref 30–100)

## 2018-01-29 MED ORDER — DENOSUMAB 60 MG/ML ~~LOC~~ SOLN
60.0000 mg | Freq: Once | SUBCUTANEOUS | Status: AC
  Administered 2018-01-29: 60 mg via SUBCUTANEOUS

## 2018-01-29 NOTE — Telephone Encounter (Signed)
Deductible $185 ($185Met)  OOP MAX Amount Met  Annual exam 12/01/17 TF  Calcium  9.4    Date 07/03/17  Upcoming dental procedures NO  Prior Authorization needed NO  Pt estimated Cost $225  Appt 01/29/18  Coverage Details 20% one dose, 20% Admin fee  

## 2018-01-30 ENCOUNTER — Telehealth: Payer: Self-pay

## 2018-01-30 NOTE — Telephone Encounter (Signed)
Should be ok to hold plavix. She underwent elective stenting > 6 months ago. thanks

## 2018-01-30 NOTE — Telephone Encounter (Signed)
I spoke with patient. She has been in a fair amount of pain for the past 3-4 months secondary to torn rotator cuff. I explained we don't usually stop Plavix within 12 months of a PCI with DES (July 2018) unless its an emergency. I will ask Dr Burt Knack to review and make recommendations.  [name] PA-C 01/30/2018 2:19 PM

## 2018-01-30 NOTE — Telephone Encounter (Signed)
   [facility] Pre-operative Risk Assessment    Request for surgical clearance:  1. What type of surgery is being performed? Right Shoulder Arthroscopy   2. When is this surgery scheduled? 02/11/2018  3. What type of clearance is required (medical clearance vs. Pharmacy clearance to hold med vs. Both)? Both  4. Are there any medications that need to be held prior to surgery and how long? If pt is taking plavix or aspirin please advise protocol  5. Practice name and name of physician performing surgery? [facility], Dr.John Graves   6. What is your office phone and fax number? Phone: [phone] Fax: [phone]  7. Anesthesia type (None, local, MAC, general) ? None Specified    Mendel Ryder 01/30/2018, 1:48 PM  _________________________________________________________________   (provider comments below)

## 2018-01-30 NOTE — Telephone Encounter (Signed)
Prolia Given 01/29/18 Next injection 08/03/18

## 2018-02-02 ENCOUNTER — Telehealth: Payer: Self-pay | Admitting: Cardiology

## 2018-02-02 NOTE — Telephone Encounter (Signed)
   Primary Cardiologist: Dr Burt Knack  Chart reviewed and patient interviewed over the phone as part of pre-operative protocol coverage. Given her past medical history and time since last visit and based on ACC/AHA guidelines, Kristine [name] is an acceptable risk for the planned procedure without further cardiovascular testing.   I discussed holding her Plavix with Dr Burt Knack. He feels that since she had an elective stent > 6 months ago, and she is in a fair amount of discomfort, she is OK to hold her Plavix pre op 3-5 days if needed. We would like her to continue her baby ASA if possible.   I will route this recommendation to the requesting party via Epic fax function and remove from pre-op pool.  Please call with questions.  [name], PA-C 02/02/2018, 1:08 PM

## 2018-02-11 ENCOUNTER — Telehealth: Payer: Self-pay | Admitting: Cardiovascular Disease

## 2018-02-11 DIAGNOSIS — M7541 Impingement syndrome of right shoulder: Secondary | ICD-10-CM | POA: Diagnosis not present

## 2018-02-11 DIAGNOSIS — G8918 Other acute postprocedural pain: Secondary | ICD-10-CM | POA: Diagnosis not present

## 2018-02-11 DIAGNOSIS — M7521 Bicipital tendinitis, right shoulder: Secondary | ICD-10-CM | POA: Diagnosis not present

## 2018-02-11 DIAGNOSIS — M94211 Chondromalacia, right shoulder: Secondary | ICD-10-CM | POA: Diagnosis not present

## 2018-02-11 DIAGNOSIS — M19011 Primary osteoarthritis, right shoulder: Secondary | ICD-10-CM | POA: Diagnosis not present

## 2018-02-11 DIAGNOSIS — S46019A Strain of muscle(s) and tendon(s) of the rotator cuff of unspecified shoulder, initial encounter: Secondary | ICD-10-CM | POA: Diagnosis not present

## 2018-02-11 DIAGNOSIS — S46011A Strain of muscle(s) and tendon(s) of the rotator cuff of right shoulder, initial encounter: Secondary | ICD-10-CM | POA: Diagnosis not present

## 2018-02-11 NOTE — Telephone Encounter (Signed)
Instructed patient to contact her surgeon to confirm when safe to restart. She was grateful for call and agrees with treatment plan.

## 2018-02-11 NOTE — Telephone Encounter (Signed)
New message      Pt c/o medication issue:  1. Name of Medication: clopidogrel (PLAVIX) 75 MG tablet  2. How are you currently taking this medication (dosage and times per day)? As prescribed  3. Are you having a reaction (difficulty breathing--STAT)? NO  4. What is your medication issue? Patient would like to know when to resume Plavix.

## 2018-02-20 DIAGNOSIS — M19011 Primary osteoarthritis, right shoulder: Secondary | ICD-10-CM | POA: Diagnosis not present

## 2018-02-22 ENCOUNTER — Other Ambulatory Visit: Payer: Self-pay | Admitting: Cardiology

## 2018-02-23 NOTE — Telephone Encounter (Signed)
REFILL 

## 2018-02-24 ENCOUNTER — Ambulatory Visit: Payer: Self-pay

## 2018-02-24 DIAGNOSIS — I252 Old myocardial infarction: Secondary | ICD-10-CM | POA: Diagnosis not present

## 2018-02-24 DIAGNOSIS — N3289 Other specified disorders of bladder: Secondary | ICD-10-CM | POA: Diagnosis not present

## 2018-02-24 DIAGNOSIS — N8110 Cystocele, unspecified: Secondary | ICD-10-CM | POA: Diagnosis not present

## 2018-02-24 DIAGNOSIS — Z886 Allergy status to analgesic agent status: Secondary | ICD-10-CM | POA: Diagnosis not present

## 2018-02-24 DIAGNOSIS — K5900 Constipation, unspecified: Secondary | ICD-10-CM | POA: Diagnosis not present

## 2018-02-24 DIAGNOSIS — K5903 Drug induced constipation: Secondary | ICD-10-CM | POA: Diagnosis not present

## 2018-02-24 DIAGNOSIS — Z888 Allergy status to other drugs, medicaments and biological substances status: Secondary | ICD-10-CM | POA: Diagnosis not present

## 2018-02-24 DIAGNOSIS — I8501 Esophageal varices with bleeding: Secondary | ICD-10-CM | POA: Diagnosis not present

## 2018-02-24 DIAGNOSIS — K921 Melena: Secondary | ICD-10-CM | POA: Diagnosis not present

## 2018-02-24 DIAGNOSIS — Z955 Presence of coronary angioplasty implant and graft: Secondary | ICD-10-CM | POA: Diagnosis not present

## 2018-02-24 DIAGNOSIS — K449 Diaphragmatic hernia without obstruction or gangrene: Secondary | ICD-10-CM | POA: Diagnosis not present

## 2018-02-24 DIAGNOSIS — K5732 Diverticulitis of large intestine without perforation or abscess without bleeding: Secondary | ICD-10-CM | POA: Diagnosis not present

## 2018-02-24 DIAGNOSIS — I2510 Atherosclerotic heart disease of native coronary artery without angina pectoris: Secondary | ICD-10-CM | POA: Diagnosis not present

## 2018-02-24 DIAGNOSIS — D500 Iron deficiency anemia secondary to blood loss (chronic): Secondary | ICD-10-CM | POA: Diagnosis not present

## 2018-02-24 DIAGNOSIS — Z90710 Acquired absence of both cervix and uterus: Secondary | ICD-10-CM | POA: Diagnosis not present

## 2018-02-24 DIAGNOSIS — Z7982 Long term (current) use of aspirin: Secondary | ICD-10-CM | POA: Diagnosis not present

## 2018-02-24 DIAGNOSIS — Z8249 Family history of ischemic heart disease and other diseases of the circulatory system: Secondary | ICD-10-CM | POA: Diagnosis not present

## 2018-02-24 DIAGNOSIS — K529 Noninfective gastroenteritis and colitis, unspecified: Secondary | ICD-10-CM | POA: Diagnosis not present

## 2018-02-24 DIAGNOSIS — Z7901 Long term (current) use of anticoagulants: Secondary | ICD-10-CM | POA: Diagnosis not present

## 2018-02-24 DIAGNOSIS — K5792 Diverticulitis of intestine, part unspecified, without perforation or abscess without bleeding: Secondary | ICD-10-CM | POA: Diagnosis not present

## 2018-02-24 DIAGNOSIS — K2211 Ulcer of esophagus with bleeding: Secondary | ICD-10-CM | POA: Diagnosis not present

## 2018-02-24 DIAGNOSIS — K5289 Other specified noninfective gastroenteritis and colitis: Secondary | ICD-10-CM | POA: Diagnosis not present

## 2018-02-24 DIAGNOSIS — Z86010 Personal history of colonic polyps: Secondary | ICD-10-CM | POA: Diagnosis not present

## 2018-02-24 DIAGNOSIS — I10 Essential (primary) hypertension: Secondary | ICD-10-CM | POA: Diagnosis not present

## 2018-02-24 DIAGNOSIS — E785 Hyperlipidemia, unspecified: Secondary | ICD-10-CM | POA: Diagnosis not present

## 2018-02-24 DIAGNOSIS — D62 Acute posthemorrhagic anemia: Secondary | ICD-10-CM | POA: Diagnosis not present

## 2018-02-24 DIAGNOSIS — Z9889 Other specified postprocedural states: Secondary | ICD-10-CM | POA: Diagnosis not present

## 2018-02-24 DIAGNOSIS — T40605A Adverse effect of unspecified narcotics, initial encounter: Secondary | ICD-10-CM | POA: Diagnosis present

## 2018-02-24 DIAGNOSIS — M341 CR(E)ST syndrome: Secondary | ICD-10-CM | POA: Diagnosis present

## 2018-02-24 DIAGNOSIS — Z96651 Presence of right artificial knee joint: Secondary | ICD-10-CM | POA: Diagnosis present

## 2018-02-24 DIAGNOSIS — Z79899 Other long term (current) drug therapy: Secondary | ICD-10-CM | POA: Diagnosis not present

## 2018-02-24 DIAGNOSIS — K219 Gastro-esophageal reflux disease without esophagitis: Secondary | ICD-10-CM | POA: Diagnosis not present

## 2018-02-24 NOTE — Telephone Encounter (Signed)
Pt calling with c/o constipation x 12 days. Care advice given. Pt wanting to see PCP. Appt made for tomorrow. Reason for Disposition . [1] Constipation persists > 1 week AND [2] following Constipation Care Advice . Taking new prescription medication  Answer Assessment - Initial Assessment Questions 1. STOOL PATTERN OR FREQUENCY: "How often do you pass bowel movements (BMs)?"  (Normal range: tid to q 3 days)  "When was the last BM passed?"       Twice daily- this am  2. STRAINING: "Do you have to strain to have a BM?"      yes 3. RECTAL PAIN: "Does your rectum hurt when the stool comes out?" If so, ask: "Do you have hemorrhoids? How bad is the pain?"  (Scale 1-10; or mild, moderate, severe)     no 4. STOOL COMPOSITION: "Are the stools hard?"      Yes  5. BLOOD ON STOOLS: "Has there been any blood on the toilet tissue or on the surface of the BM?" If so, ask: "When was the last time?"      no 6. CHRONIC CONSTIPATION: "Is this a new problem for you?"  If no, ask: How long have you had this problem?" (days, weeks, months)      Yes - 12 days 7. CHANGES IN DIET: "Have there been any recent changes in your diet?"      no 8. MEDICATIONS: "Have you been taking any new medications?"     Hydrocodone  W/ acetaminophen 9. LAXATIVES: "Have you been using any laxatives or enemas?"  If yes, ask "What, how often, and when was the last time?"     Miralax, milk of Magnesia 10. CAUSE: "What do you think is causing the constipation?"        Pain med 11. OTHER SYMPTOMS: "Do you have any other symptoms?" (e.g., abdominal pain, fever, vomiting)       Cramping to the lower part of abd but is intermittent 12. PREGNANCY: "Is there any chance you are pregnant?" "When was your last menstrual period?"       n/a  Protocols used: CONSTIPATION-A-AH

## 2018-02-25 ENCOUNTER — Telehealth: Payer: Self-pay | Admitting: Family Medicine

## 2018-02-25 ENCOUNTER — Ambulatory Visit: Payer: Self-pay | Admitting: Family Medicine

## 2018-02-25 ENCOUNTER — Telehealth: Payer: Self-pay | Admitting: Cardiovascular Disease

## 2018-02-25 NOTE — Telephone Encounter (Signed)
Kristine [name] is calling because she had a rotator cuff surgery and the pain medicine has caused her to have some problems and she went to the [facility] ) and they kept her . She is wanting to let Dr. Burt Knack know this . Please call if you have any questions please call on cell at [phone].

## 2018-02-25 NOTE — Telephone Encounter (Signed)
Copied from [place] [id number]. Topic: Quick Communication - Appointment Cancellation >> Feb 25, 2018  8:45 AM Kristine [name], NT wrote: Patient called to cancel appointment scheduled for 02/25/18. Patient has not rescheduled their appointment.  Patient is in the hospital due to constipation. She has also lost blood so will be getting a blood transfusion and is believed to have a ulcer on her colon. She wanted Dr. Tamala Julian to know.   Route to department's PEC pool.

## 2018-02-25 NOTE — Telephone Encounter (Signed)
Patient reports she is at [facility] because she was constipated due to her pain medications after surgery then she had some bleeding. She is planned to have an endoscopy tomorrow by an associate of Dr. Sunny Schlein. Wished patient luck and instructed her to call if she needs anything.  She was grateful for call.

## 2018-02-27 DIAGNOSIS — K2211 Ulcer of esophagus with bleeding: Secondary | ICD-10-CM | POA: Insufficient documentation

## 2018-03-01 MED ORDER — THERA PO TABS
ORAL_TABLET | ORAL | Status: DC
Start: 2018-02-28 — End: 2018-03-01

## 2018-03-01 MED ORDER — ALBUTEROL SULFATE (2.5 MG/3ML) 0.083% IN NEBU
2.50 | INHALATION_SOLUTION | RESPIRATORY_TRACT | Status: DC
Start: ? — End: 2018-03-01

## 2018-03-01 MED ORDER — LOSARTAN POTASSIUM 25 MG PO TABS
25.00 | ORAL_TABLET | ORAL | Status: DC
Start: 2018-02-28 — End: 2018-03-01

## 2018-03-01 MED ORDER — PANTOPRAZOLE SODIUM 40 MG IV SOLR
40.00 | INTRAVENOUS | Status: DC
Start: 2018-02-27 — End: 2018-03-01

## 2018-03-01 MED ORDER — EZETIMIBE 10 MG PO TABS
10.00 | ORAL_TABLET | ORAL | Status: DC
Start: 2018-02-27 — End: 2018-03-01

## 2018-03-01 MED ORDER — NITROGLYCERIN 0.4 MG SL SUBL
0.40 | SUBLINGUAL_TABLET | SUBLINGUAL | Status: DC
Start: ? — End: 2018-03-01

## 2018-03-01 MED ORDER — HYDROCODONE-ACETAMINOPHEN 5-325 MG PO TABS
ORAL_TABLET | ORAL | Status: DC
Start: ? — End: 2018-03-01

## 2018-03-01 MED ORDER — METRONIDAZOLE IN NACL 5-0.79 MG/ML-% IV SOLN
500.00 | INTRAVENOUS | Status: DC
Start: 2018-02-27 — End: 2018-03-01

## 2018-03-01 MED ORDER — ENOXAPARIN SODIUM 40 MG/0.4ML ~~LOC~~ SOLN
40.00 | SUBCUTANEOUS | Status: DC
Start: 2018-02-27 — End: 2018-03-01

## 2018-03-01 MED ORDER — ANTACID & ANTIGAS 200-200-20 MG/5ML PO SUSP
30.00 | ORAL | Status: DC
Start: ? — End: 2018-03-01

## 2018-03-01 MED ORDER — GENERIC EXTERNAL MEDICATION
Status: DC
Start: ? — End: 2018-03-01

## 2018-03-01 MED ORDER — ASPIRIN EC 81 MG PO TBEC
81.00 | DELAYED_RELEASE_TABLET | ORAL | Status: DC
Start: 2018-02-28 — End: 2018-03-01

## 2018-03-01 MED ORDER — ACETAMINOPHEN 325 MG PO TABS
650.00 | ORAL_TABLET | ORAL | Status: DC
Start: ? — End: 2018-03-01

## 2018-03-01 MED ORDER — SODIUM CHLORIDE 0.9 % IV SOLN
INTRAVENOUS | Status: DC
Start: ? — End: 2018-03-01

## 2018-03-01 MED ORDER — POLYETHYLENE GLYCOL 3350 17 G PO PACK
17.00 | PACK | ORAL | Status: DC
Start: ? — End: 2018-03-01

## 2018-03-01 MED ORDER — HYDRALAZINE HCL 20 MG/ML IJ SOLN
10.00 | INTRAMUSCULAR | Status: DC
Start: ? — End: 2018-03-01

## 2018-03-01 MED ORDER — GENERIC EXTERNAL MEDICATION
1.00 | Status: DC
Start: 2018-02-28 — End: 2018-03-01

## 2018-03-01 MED ORDER — HYDROCODONE-ACETAMINOPHEN 10-325 MG PO TABS
ORAL_TABLET | ORAL | Status: DC
Start: ? — End: 2018-03-01

## 2018-03-01 MED ORDER — ACETAMINOPHEN 650 MG RE SUPP
650.00 | RECTAL | Status: DC
Start: ? — End: 2018-03-01

## 2018-03-05 DIAGNOSIS — M75121 Complete rotator cuff tear or rupture of right shoulder, not specified as traumatic: Secondary | ICD-10-CM | POA: Diagnosis not present

## 2018-03-05 DIAGNOSIS — M25511 Pain in right shoulder: Secondary | ICD-10-CM | POA: Diagnosis not present

## 2018-03-09 ENCOUNTER — Telehealth: Payer: Self-pay | Admitting: Cardiovascular Disease

## 2018-03-09 ENCOUNTER — Telehealth: Payer: Self-pay | Admitting: Family Medicine

## 2018-03-09 DIAGNOSIS — R05 Cough: Secondary | ICD-10-CM | POA: Diagnosis not present

## 2018-03-09 DIAGNOSIS — I10 Essential (primary) hypertension: Secondary | ICD-10-CM | POA: Diagnosis not present

## 2018-03-09 DIAGNOSIS — J208 Acute bronchitis due to other specified organisms: Secondary | ICD-10-CM | POA: Diagnosis not present

## 2018-03-09 NOTE — Telephone Encounter (Signed)
Called pt to try and schedule a hospital follow up with Dr. Tamala Julian. Left VM -   if pt calls back, please schedule a hospital F/U with Dr. Tamala Julian at pt's convenience.

## 2018-03-09 NOTE — Telephone Encounter (Signed)
Please call patient to schedule hospital follow-up with me.

## 2018-03-09 NOTE — Telephone Encounter (Signed)
New Message       [facility] Pre-operative Risk Assessment    Request for surgical clearance:  1. What type of surgery is being performed? Upper Endoscopy   2. When is this surgery scheduled? Clearance TBD   3. What type of clearance is required (medical clearance vs. Pharmacy clearance to hold med vs. Both)?Both  4. Are there any medications that need to be held prior to surgery and how long? Plavix  5. Practice name and name of physician performing surgery? [facility]. Dr. Ebony Hail    6. What is your office phone number [phone]   7.   What is your office fax number [phone]  8.   Anesthesia type (None, local, MAC, general) ? Propafol   Kristine [name] 03/09/2018, 11:01 AM  _________________________________________________________________   (provider comments below)

## 2018-03-10 DIAGNOSIS — E785 Hyperlipidemia, unspecified: Secondary | ICD-10-CM | POA: Diagnosis not present

## 2018-03-10 DIAGNOSIS — Z888 Allergy status to other drugs, medicaments and biological substances status: Secondary | ICD-10-CM | POA: Diagnosis not present

## 2018-03-10 DIAGNOSIS — R042 Hemoptysis: Secondary | ICD-10-CM | POA: Diagnosis not present

## 2018-03-10 DIAGNOSIS — R05 Cough: Secondary | ICD-10-CM | POA: Diagnosis not present

## 2018-03-10 DIAGNOSIS — Z7951 Long term (current) use of inhaled steroids: Secondary | ICD-10-CM | POA: Diagnosis not present

## 2018-03-10 DIAGNOSIS — I10 Essential (primary) hypertension: Secondary | ICD-10-CM | POA: Diagnosis not present

## 2018-03-10 DIAGNOSIS — Z79899 Other long term (current) drug therapy: Secondary | ICD-10-CM | POA: Diagnosis not present

## 2018-03-10 DIAGNOSIS — I252 Old myocardial infarction: Secondary | ICD-10-CM | POA: Diagnosis not present

## 2018-03-10 DIAGNOSIS — J209 Acute bronchitis, unspecified: Secondary | ICD-10-CM | POA: Diagnosis not present

## 2018-03-10 DIAGNOSIS — K219 Gastro-esophageal reflux disease without esophagitis: Secondary | ICD-10-CM | POA: Diagnosis not present

## 2018-03-10 DIAGNOSIS — K449 Diaphragmatic hernia without obstruction or gangrene: Secondary | ICD-10-CM | POA: Diagnosis not present

## 2018-03-10 DIAGNOSIS — Z886 Allergy status to analgesic agent status: Secondary | ICD-10-CM | POA: Diagnosis not present

## 2018-03-10 DIAGNOSIS — I2510 Atherosclerotic heart disease of native coronary artery without angina pectoris: Secondary | ICD-10-CM | POA: Diagnosis not present

## 2018-03-10 DIAGNOSIS — J40 Bronchitis, not specified as acute or chronic: Secondary | ICD-10-CM | POA: Diagnosis not present

## 2018-03-10 NOTE — Telephone Encounter (Signed)
   Patient ended up in the hospital with a GI bleed in late March, she has already had an endoscopy and will cancel this 1.  Today, she started coughing up blood and ended up back in the emergency room but is being released.  She says it is time for her to see Dr. Burt Knack, I will route this to the callback pool so that they can make an appointment and call her tomorrow.  Rosaria Ferries, PA-C 03/10/2018 4:14 PM [place] [phone]

## 2018-03-11 NOTE — Telephone Encounter (Signed)
S/w pt already had endoscopy in hospital with ulcer repair, is not having another endoscopy, wants appt with Dr.Cooper. Pt is scheduled June 7 for an ov with Dr.Cooper.

## 2018-03-17 DIAGNOSIS — Z9889 Other specified postprocedural states: Secondary | ICD-10-CM | POA: Diagnosis not present

## 2018-03-18 DIAGNOSIS — M75121 Complete rotator cuff tear or rupture of right shoulder, not specified as traumatic: Secondary | ICD-10-CM | POA: Diagnosis not present

## 2018-03-18 DIAGNOSIS — M25511 Pain in right shoulder: Secondary | ICD-10-CM | POA: Diagnosis not present

## 2018-03-20 DIAGNOSIS — M25511 Pain in right shoulder: Secondary | ICD-10-CM | POA: Diagnosis not present

## 2018-03-20 DIAGNOSIS — M75121 Complete rotator cuff tear or rupture of right shoulder, not specified as traumatic: Secondary | ICD-10-CM | POA: Diagnosis not present

## 2018-03-23 DIAGNOSIS — M25511 Pain in right shoulder: Secondary | ICD-10-CM | POA: Diagnosis not present

## 2018-03-23 DIAGNOSIS — M75121 Complete rotator cuff tear or rupture of right shoulder, not specified as traumatic: Secondary | ICD-10-CM | POA: Diagnosis not present

## 2018-03-26 DIAGNOSIS — M75121 Complete rotator cuff tear or rupture of right shoulder, not specified as traumatic: Secondary | ICD-10-CM | POA: Diagnosis not present

## 2018-03-26 DIAGNOSIS — M25511 Pain in right shoulder: Secondary | ICD-10-CM | POA: Diagnosis not present

## 2018-03-30 DIAGNOSIS — M75121 Complete rotator cuff tear or rupture of right shoulder, not specified as traumatic: Secondary | ICD-10-CM | POA: Diagnosis not present

## 2018-03-30 DIAGNOSIS — M25511 Pain in right shoulder: Secondary | ICD-10-CM | POA: Diagnosis not present

## 2018-04-01 ENCOUNTER — Telehealth: Payer: Self-pay

## 2018-04-01 DIAGNOSIS — K529 Noninfective gastroenteritis and colitis, unspecified: Secondary | ICD-10-CM | POA: Diagnosis not present

## 2018-04-01 DIAGNOSIS — K449 Diaphragmatic hernia without obstruction or gangrene: Secondary | ICD-10-CM | POA: Diagnosis not present

## 2018-04-01 DIAGNOSIS — M341 CR(E)ST syndrome: Secondary | ICD-10-CM | POA: Diagnosis not present

## 2018-04-01 DIAGNOSIS — R140 Abdominal distension (gaseous): Secondary | ICD-10-CM | POA: Diagnosis not present

## 2018-04-01 DIAGNOSIS — I10 Essential (primary) hypertension: Secondary | ICD-10-CM | POA: Diagnosis not present

## 2018-04-01 DIAGNOSIS — K219 Gastro-esophageal reflux disease without esophagitis: Secondary | ICD-10-CM | POA: Diagnosis not present

## 2018-04-01 NOTE — Telephone Encounter (Signed)
   [facility] Pre-operative Risk Assessment    Request for surgical clearance:  1. What type of surgery is being performed? SIGMOIDOSCOPY   2. When is this surgery scheduled? 04/27/18   3. What type of clearance is required (medical clearance vs. Pharmacy clearance to hold med vs. Both)? BOTH  4. Are there any medications that need to be held prior to surgery and how long? PLAVIX 4 DAYS PRIOR TO PROCEDURRE   5. Practice name and name of physician performing surgery? [facility] - DR.RAJAN   6. What is your office phone number [phone]    7.   What is your office fax number [phone]  8.   Anesthesia type (None, local, MAC, general) ? NONE LISTED   Jacinta Shoe 04/01/2018, 4:53 PM  _________________________________________________________________   (provider comments below)

## 2018-04-03 NOTE — Telephone Encounter (Signed)
   Primary Cardiologist: [name], MD  Chart reviewed as part of pre-operative protocol coverage. Patient was contacted 04/03/2018 in reference to pre-operative risk assessment for pending surgery as outlined below.  Kristine [name] was last seen 10/2017 by Dr. Burt Knack. H/o CAD s/p PCI, CREST, Barretts, GERD, HTN, HLD, ICM, Raynaud, anemia. Last PCI/cath 05/30/17 s/p FFR guided PCI with DES to circumflex/first OM. Earlier this year was cleared to hold Plavix after 6 months for GI studies. RCRI 0.9%. Called pt at home. Since that day, Kristine [name] has done well without any new cardiac sx. Able to perform 4.64 METS without angina - limited by her arm being in a sling due to rotator cuff tear. Therefore, based on ACC/AHA guidelines, the patient would be at acceptable risk for the planned procedure without further cardiovascular testing.   I will forward this message to Dr. Burt Knack about holding of Plavix, and whether he wants her to even resume at all. She has f/u with hin 05/01/18 (which is after date of procedure). Dr. Burt Knack, please route your reply to P CV DIV PREOP. Of note, the patient also mentioned she might try to get a tooth extracted during the time off her antiplatelets. We did discuss that recommendations for dental extractions don't typically require holding of blood thinners, and I advised her to discuss with her dentist.  [name], PA-C 04/03/2018, 3:26 PM

## 2018-04-06 NOTE — Telephone Encounter (Signed)
Forwarded via Epic to Dr. Sunny Schlein at [phone].

## 2018-04-06 NOTE — Telephone Encounter (Signed)
The patient is ok to hold plavix 5 days for the procedure. I will discuss discontinuation of plavix when she is seen for follow-up

## 2018-04-07 DIAGNOSIS — Z9889 Other specified postprocedural states: Secondary | ICD-10-CM | POA: Diagnosis not present

## 2018-04-08 DIAGNOSIS — I10 Essential (primary) hypertension: Secondary | ICD-10-CM | POA: Diagnosis not present

## 2018-04-08 DIAGNOSIS — J208 Acute bronchitis due to other specified organisms: Secondary | ICD-10-CM | POA: Diagnosis not present

## 2018-04-08 DIAGNOSIS — B9689 Other specified bacterial agents as the cause of diseases classified elsewhere: Secondary | ICD-10-CM | POA: Diagnosis not present

## 2018-04-13 DIAGNOSIS — M25511 Pain in right shoulder: Secondary | ICD-10-CM | POA: Diagnosis not present

## 2018-04-13 DIAGNOSIS — M75121 Complete rotator cuff tear or rupture of right shoulder, not specified as traumatic: Secondary | ICD-10-CM | POA: Diagnosis not present

## 2018-04-15 DIAGNOSIS — M25511 Pain in right shoulder: Secondary | ICD-10-CM | POA: Diagnosis not present

## 2018-04-15 DIAGNOSIS — M75121 Complete rotator cuff tear or rupture of right shoulder, not specified as traumatic: Secondary | ICD-10-CM | POA: Diagnosis not present

## 2018-04-16 DIAGNOSIS — M25511 Pain in right shoulder: Secondary | ICD-10-CM | POA: Diagnosis not present

## 2018-04-16 DIAGNOSIS — M75121 Complete rotator cuff tear or rupture of right shoulder, not specified as traumatic: Secondary | ICD-10-CM | POA: Diagnosis not present

## 2018-04-21 DIAGNOSIS — M25511 Pain in right shoulder: Secondary | ICD-10-CM | POA: Diagnosis not present

## 2018-04-21 DIAGNOSIS — M75121 Complete rotator cuff tear or rupture of right shoulder, not specified as traumatic: Secondary | ICD-10-CM | POA: Diagnosis not present

## 2018-04-22 ENCOUNTER — Encounter: Payer: Self-pay | Admitting: Family Medicine

## 2018-04-23 DIAGNOSIS — M25511 Pain in right shoulder: Secondary | ICD-10-CM | POA: Diagnosis not present

## 2018-04-23 DIAGNOSIS — M75121 Complete rotator cuff tear or rupture of right shoulder, not specified as traumatic: Secondary | ICD-10-CM | POA: Diagnosis not present

## 2018-04-27 DIAGNOSIS — R933 Abnormal findings on diagnostic imaging of other parts of digestive tract: Secondary | ICD-10-CM | POA: Diagnosis not present

## 2018-04-27 DIAGNOSIS — K5730 Diverticulosis of large intestine without perforation or abscess without bleeding: Secondary | ICD-10-CM | POA: Diagnosis not present

## 2018-04-27 DIAGNOSIS — K648 Other hemorrhoids: Secondary | ICD-10-CM | POA: Diagnosis not present

## 2018-04-27 DIAGNOSIS — R9389 Abnormal findings on diagnostic imaging of other specified body structures: Secondary | ICD-10-CM | POA: Diagnosis not present

## 2018-04-27 LAB — HM COLONOSCOPY

## 2018-04-28 DIAGNOSIS — M75121 Complete rotator cuff tear or rupture of right shoulder, not specified as traumatic: Secondary | ICD-10-CM | POA: Diagnosis not present

## 2018-04-28 DIAGNOSIS — M25511 Pain in right shoulder: Secondary | ICD-10-CM | POA: Diagnosis not present

## 2018-05-01 ENCOUNTER — Encounter: Payer: Self-pay | Admitting: Cardiovascular Disease

## 2018-05-01 ENCOUNTER — Ambulatory Visit (INDEPENDENT_AMBULATORY_CARE_PROVIDER_SITE_OTHER): Payer: Medicare Other | Admitting: Cardiovascular Disease

## 2018-05-01 VITALS — BP 142/70 | HR 88 | Ht 61.0 in | Wt 124.6 lb

## 2018-05-01 DIAGNOSIS — E782 Mixed hyperlipidemia: Secondary | ICD-10-CM

## 2018-05-01 DIAGNOSIS — I2510 Atherosclerotic heart disease of native coronary artery without angina pectoris: Secondary | ICD-10-CM | POA: Diagnosis not present

## 2018-05-01 NOTE — Progress Notes (Signed)
Cardiology Office Note Date:  05/01/2018   ID:  Kristine [name], DOB 07/28/1941, MRN [id number]  PCP:  Wardell Honour, MD  Cardiologist:  [name], MD    Chief Complaint  Patient presents with  . Follow-up    CAD     History of Present Illness: Kristine [name] is a 77 y.o. female who presents for follow-up of coronary artery disease.  The patient has a history of STEMI involving the left circumflex treated with primary PCI.  She developed recurrent angina and underwent repeat catheterization in July 2018 where she was found to have moderately severe stenosis involving the proximal edge of the previously placed left circumflex stent.  The LAD and RCA vessels were patent with mild nonobstructive stenosis noted.  She underwent FFR guided PCI with a 3.5 mm Promus DES in the proximal circumflex.  She is here alone today. Retired in September 2018. Has had a few episodes of bronchitis since that time. She then developed constipation and had a GI bleed after being treated with laxatives, required 2 units of PRBC's and was found to have a bleeding ulcer. All of this has now cleared up and she is doing ok.  She denies chest pain, chest pressure, or shortness of breath.  Past Medical History:  Diagnosis Date  . Anemia   . Arthritis   . Barrett's esophagus    Stage B  . CAD (coronary artery disease)    a. s/p STEMI 7/14 >> LHC: LM 20, mLAD 30-40, small Dx 100 (L-L collats), OM1 90, OM2 30-40, mRCA 50-60, Lat/AL akinesis, EF 35-40% >> PCI: Promus DES to OM1;  b. Myoview 3/16: diaph atten, no ischemia, EF 59% (intermediate risk b/c BP drop 152>>137, but recovery BP 162 (tech error) >> med Rx continued  c. cath 05/30/17 s/p FFR guided PCI with DES to circumflex/first OM   . CREST variant of scleroderma ([place])   . Cystocele   . Diverticulitis   . GERD (gastroesophageal reflux disease)   . History of blood transfusion    hx of being Anti-K positive  . History of Doppler ultrasound    a. Carotid US  2/12: bilateral ICA < 50%  . Hyperlipidemia   . Hypertension   . Ischemic cardiomyopathy    a. EF 35-40% by LHC at time of MI;  b. Echo 7/14:  Mild focal basal septal hypertrophy, EF 50-55%, apical HK, grade 1 diastolic dysfunction, trivial AI   . Ischemic colitis ([place])   . Osteoporosis 12/2017   T score -2.6  . Raynaud disease   . Rectocele    mild    Past Surgical History:  Procedure Laterality Date  . ABDOMINAL HYSTERECTOMY  1996   RSO for menorrhagia.  . CORONARY ANGIOPLASTY WITH STENT PLACEMENT  06/21/2013  . CORONARY STENT INTERVENTION N/A 05/30/2017   Procedure: Coronary Stent Intervention;  Surgeon: [name], MD;  Location: [facility];  Service: Cardiovascular;  Laterality: N/A;  . FOOT SURGERY  2013   x2  . INTRAVASCULAR PRESSURE WIRE/FFR STUDY N/A 05/30/2017   Procedure: Intravascular Pressure Wire/FFR Study;  Surgeon: [name], MD;  Location: [facility];  Service: Cardiovascular;  Laterality: N/A;  . JOINT REPLACEMENT  9/10   rt total  . LEFT HEART CATH AND CORONARY ANGIOGRAPHY N/A 05/30/2017   Procedure: Left Heart Cath and Coronary Angiography;  Surgeon: [name], MD;  Location: [facility];  Service: Cardiovascular;  Laterality: N/A;  . nissen fundoplication  7846  .  TONSILLECTOMY  1950  . TOTAL KNEE ARTHROPLASTY     right  . TUBAL LIGATION  1980    Current Outpatient Medications  Medication Sig Dispense Refill  . acetaminophen (TYLENOL) 500 MG tablet Take 1,000 mg by mouth every 6 (six) hours as needed for headache.    . ALPRAZolam (XANAX) 0.25 MG tablet Take 1 tablet (0.25 mg total) by mouth daily as needed for anxiety. 30 tablet 0  . aspirin EC 81 MG EC tablet Take 1 tablet (81 mg total) by mouth daily.    . clopidogrel (PLAVIX) 75 MG tablet Take 1 tablet (75 mg total) by mouth daily. 90 tablet 3  . ezetimibe (ZETIA) 10 MG tablet TAKE 1/2 TABLET(5 MG) BY MOUTH DAILY 15 tablet 3  . losartan (COZAAR) 50 MG tablet TAKE  ONE-HALF TABLET BY MOUTH DAILY 45 tablet 2  . nitroGLYCERIN (NITROSTAT) 0.4 MG SL tablet Place 1 tablet (0.4 mg total) under the tongue every 5 (five) minutes x 3 doses as needed for chest pain. 25 tablet 12  . pantoprazole (PROTONIX) 40 MG tablet Take 40 mg by mouth daily.    . pravastatin (PRAVACHOL) 10 MG tablet TAKE ONE TABLET BY MOUTH EVERY EVENING 90 tablet 2  . ranitidine (ZANTAC) 150 MG tablet Take 150 mg by mouth at bedtime.    . metoprolol succinate (TOPROL XL) 25 MG 24 hr tablet Take 0.5 tablets (12.5 mg total) by mouth daily. (Patient not taking: Reported on 05/01/2018) 15 tablet 11   No current facility-administered medications for this visit.     Allergies:   Aspirin; Pravastatin; Flagyl [metronidazole]; Nsaids; Pravastatin sodium; and Statins   Social History:  The patient  reports that she has never smoked. She has never used smokeless tobacco. She reports that she does not drink alcohol or use drugs.   Family History:  The patient's  family history includes Asthma in her maternal grandmother; Cancer in her maternal grandfather and mother; Emphysema in her maternal grandmother; Heart attack in her father; Heart disease in her father; Hepatitis C in her mother; Hypertension in her daughter; Panic disorder in her daughter.    ROS:  Please see the history of present illness.  Otherwise, review of systems is positive for hearing loss, cough.  All other systems are reviewed and negative.    PHYSICAL EXAM: VS:  BP (!) 142/70   Pulse 88   Ht 5\' 1"  (1.549 m)   Wt 124 lb 9.6 oz (56.5 kg)   LMP 11/25/1994   SpO2 98%   BMI 23.54 kg/m  , BMI Body mass index is 23.54 kg/m. GEN: Well nourished, well developed, in no acute distress  HEENT: normal  Neck: no JVD, no masses. No carotid bruits Cardiac: RRR without murmur or gallop                Respiratory:  clear to auscultation bilaterally, normal work of breathing GI: soft, nontender, nondistended, + BS MS: no deformity or atrophy    Ext: no pretibial edema, pedal pulses 2+= bilaterally Skin: warm and dry, no rash Neuro:  Strength and sensation are intact Psych: euthymic mood, full affect  EKG:  EKG is ordered today. The ekg ordered today shows NSR, LAD, RV conduction delay  Recent Labs: 06/11/2017: Platelets 208 07/03/2017: ALT 14; BUN 10; Creatinine, Ser 0.73; Hemoglobin 11.3; Potassium 4.2; Sodium 141   Lipid Panel     Component Value Date/Time   CHOL 148 04/28/2017 1003   CHOL 153 07/29/2014 0926  TRIG 126 04/28/2017 1003   TRIG 112 07/29/2014 0926   HDL 45 04/28/2017 1003   HDL 49 07/29/2014 0926   CHOLHDL 3.3 04/28/2017 1003   CHOLHDL 2.6 11/23/2015 0912   VLDL 17 11/23/2015 0912   LDLCALC 78 04/28/2017 1003   LDLCALC 82 07/29/2014 0926      Wt Readings from Last 3 Encounters:  05/01/18 124 lb 9.6 oz (56.5 kg)  12/01/17 131 lb (59.4 kg)  10/30/17 132 lb 12.8 oz (60.2 kg)     ASSESSMENT AND PLAN: 1.  CAD, native vessel, without angina: The patient is doing well at present.  She is greater than 6 months out from her last elective PCI procedure and can discontinue clopidogrel per current guidelines.  She should remain on aspirin 81 mg daily.  2. HTN: BP controlled. She has discontinued metoprolol on her own because of good BP readings at home.  3. Hyperlipidemia: she takes low dose pravastatin and zetia because of tolerance. Last lipids reviewed with LDL 78 mg/dL and HDL 45.  LFTs have been recently checked.  Will update her lipids and LFTs next year when she returns for follow-up.  Current medicines are reviewed with the patient today.  The patient does not have concerns regarding medicines.  Labs/ tests ordered today include:  No orders of the defined types were placed in this encounter.   Disposition:   FU 1 year with lipids and LFTs  Signed, [name], MD  05/01/2018 2:22 PM    [facility] [address], [place], [place]  [address] Phone: [phone]; Fax:  [phone]

## 2018-05-01 NOTE — Patient Instructions (Signed)
Medication Instructions:  1) STOP PLAVIX   Labwork: None at this time   Testing/Procedures: None  Follow-Up: Your provider wants you to follow-up in: 1 year with Dr. Burt Knack. You will receive a reminder letter in the mail two months in advance. If you don't receive a letter, please call our office to schedule the follow-up appointment.    Any Other Special Instructions Will Be Listed Below (If Applicable).     If you need a refill on your cardiac medications before your next appointment, please call your pharmacy.

## 2018-05-04 DIAGNOSIS — M25511 Pain in right shoulder: Secondary | ICD-10-CM | POA: Diagnosis not present

## 2018-05-04 DIAGNOSIS — M75121 Complete rotator cuff tear or rupture of right shoulder, not specified as traumatic: Secondary | ICD-10-CM | POA: Diagnosis not present

## 2018-05-06 DIAGNOSIS — M75121 Complete rotator cuff tear or rupture of right shoulder, not specified as traumatic: Secondary | ICD-10-CM | POA: Diagnosis not present

## 2018-05-06 DIAGNOSIS — M25511 Pain in right shoulder: Secondary | ICD-10-CM | POA: Diagnosis not present

## 2018-05-10 DIAGNOSIS — R05 Cough: Secondary | ICD-10-CM | POA: Diagnosis not present

## 2018-05-10 DIAGNOSIS — I10 Essential (primary) hypertension: Secondary | ICD-10-CM | POA: Diagnosis not present

## 2018-05-11 DIAGNOSIS — M75121 Complete rotator cuff tear or rupture of right shoulder, not specified as traumatic: Secondary | ICD-10-CM | POA: Diagnosis not present

## 2018-05-11 DIAGNOSIS — M25511 Pain in right shoulder: Secondary | ICD-10-CM | POA: Diagnosis not present

## 2018-05-15 DIAGNOSIS — M75121 Complete rotator cuff tear or rupture of right shoulder, not specified as traumatic: Secondary | ICD-10-CM | POA: Diagnosis not present

## 2018-05-15 DIAGNOSIS — M25511 Pain in right shoulder: Secondary | ICD-10-CM | POA: Diagnosis not present

## 2018-05-18 DIAGNOSIS — M25511 Pain in right shoulder: Secondary | ICD-10-CM | POA: Diagnosis not present

## 2018-05-18 DIAGNOSIS — M75121 Complete rotator cuff tear or rupture of right shoulder, not specified as traumatic: Secondary | ICD-10-CM | POA: Diagnosis not present

## 2018-05-22 DIAGNOSIS — M25511 Pain in right shoulder: Secondary | ICD-10-CM | POA: Diagnosis not present

## 2018-05-22 DIAGNOSIS — M75121 Complete rotator cuff tear or rupture of right shoulder, not specified as traumatic: Secondary | ICD-10-CM | POA: Diagnosis not present

## 2018-05-25 DIAGNOSIS — M341 CR(E)ST syndrome: Secondary | ICD-10-CM | POA: Insufficient documentation

## 2018-05-25 DIAGNOSIS — I2510 Atherosclerotic heart disease of native coronary artery without angina pectoris: Secondary | ICD-10-CM | POA: Diagnosis not present

## 2018-05-25 DIAGNOSIS — I10 Essential (primary) hypertension: Secondary | ICD-10-CM | POA: Diagnosis not present

## 2018-05-25 DIAGNOSIS — R05 Cough: Secondary | ICD-10-CM | POA: Diagnosis not present

## 2018-05-25 DIAGNOSIS — K219 Gastro-esophageal reflux disease without esophagitis: Secondary | ICD-10-CM | POA: Diagnosis not present

## 2018-05-25 DIAGNOSIS — M75121 Complete rotator cuff tear or rupture of right shoulder, not specified as traumatic: Secondary | ICD-10-CM | POA: Diagnosis not present

## 2018-05-25 DIAGNOSIS — M25511 Pain in right shoulder: Secondary | ICD-10-CM | POA: Diagnosis not present

## 2018-05-27 ENCOUNTER — Telehealth: Payer: Self-pay | Admitting: Cardiovascular Disease

## 2018-05-27 DIAGNOSIS — M25511 Pain in right shoulder: Secondary | ICD-10-CM | POA: Diagnosis not present

## 2018-05-27 DIAGNOSIS — M75121 Complete rotator cuff tear or rupture of right shoulder, not specified as traumatic: Secondary | ICD-10-CM | POA: Diagnosis not present

## 2018-05-27 NOTE — Telephone Encounter (Signed)
Ok to use Symbicort or Breo inhalers along with current meds.

## 2018-05-27 NOTE — Telephone Encounter (Signed)
New Message:    Pt wants to know if it will be alright for her to take Simbicor or Brio along with her other medicine and her condition? Pt says if she is not there, please leave a message on her answering machine.

## 2018-05-27 NOTE — Telephone Encounter (Signed)
Left message for patient that she may use Symbicort or Breo inhalers with her current meds. Instructed her to call with questions or concerns.

## 2018-05-29 DIAGNOSIS — R05 Cough: Secondary | ICD-10-CM | POA: Diagnosis not present

## 2018-05-29 DIAGNOSIS — M341 CR(E)ST syndrome: Secondary | ICD-10-CM | POA: Diagnosis not present

## 2018-06-01 DIAGNOSIS — M25511 Pain in right shoulder: Secondary | ICD-10-CM | POA: Diagnosis not present

## 2018-06-01 DIAGNOSIS — M75121 Complete rotator cuff tear or rupture of right shoulder, not specified as traumatic: Secondary | ICD-10-CM | POA: Diagnosis not present

## 2018-06-02 DIAGNOSIS — M25511 Pain in right shoulder: Secondary | ICD-10-CM | POA: Diagnosis not present

## 2018-06-02 DIAGNOSIS — M75121 Complete rotator cuff tear or rupture of right shoulder, not specified as traumatic: Secondary | ICD-10-CM | POA: Diagnosis not present

## 2018-06-03 DIAGNOSIS — M75121 Complete rotator cuff tear or rupture of right shoulder, not specified as traumatic: Secondary | ICD-10-CM | POA: Diagnosis not present

## 2018-06-03 DIAGNOSIS — M25511 Pain in right shoulder: Secondary | ICD-10-CM | POA: Diagnosis not present

## 2018-06-08 DIAGNOSIS — M75121 Complete rotator cuff tear or rupture of right shoulder, not specified as traumatic: Secondary | ICD-10-CM | POA: Diagnosis not present

## 2018-06-08 DIAGNOSIS — M25511 Pain in right shoulder: Secondary | ICD-10-CM | POA: Diagnosis not present

## 2018-06-09 DIAGNOSIS — K449 Diaphragmatic hernia without obstruction or gangrene: Secondary | ICD-10-CM | POA: Diagnosis not present

## 2018-06-09 DIAGNOSIS — K219 Gastro-esophageal reflux disease without esophagitis: Secondary | ICD-10-CM | POA: Diagnosis not present

## 2018-06-09 DIAGNOSIS — I10 Essential (primary) hypertension: Secondary | ICD-10-CM | POA: Diagnosis not present

## 2018-06-09 DIAGNOSIS — M341 CR(E)ST syndrome: Secondary | ICD-10-CM | POA: Diagnosis not present

## 2018-06-09 DIAGNOSIS — K2211 Ulcer of esophagus with bleeding: Secondary | ICD-10-CM | POA: Diagnosis not present

## 2018-06-09 DIAGNOSIS — D649 Anemia, unspecified: Secondary | ICD-10-CM | POA: Diagnosis not present

## 2018-06-10 ENCOUNTER — Encounter: Payer: Self-pay | Admitting: *Deleted

## 2018-06-12 ENCOUNTER — Telehealth: Payer: Self-pay | Admitting: Internal Medicine

## 2018-06-12 ENCOUNTER — Telehealth: Payer: Self-pay | Admitting: Cardiovascular Disease

## 2018-06-12 NOTE — Telephone Encounter (Signed)
Called patient back. Patient stated she is stressed out about company coming and her BPs have been high. Patient stated she has taken Losartan 50 mg today which is double what she usually takes. Patient stated her HR is 70's to 80's. Asked patient to take her metoprolol 12. 5 mg, which she had stopped taking because her BP was controlled. Encouraged patient to keep a record of her BP over the next week, checking BP once a day, and give our office a call if taking her metoprolol does not help. Patient agreed to plan. Will forward to Dr. Burt Knack  for further advisement.

## 2018-06-12 NOTE — Telephone Encounter (Signed)
Cardiology Moonlighter Note  Returned page from patient.  Noticed that her blood pressure was elevated tonight.  Systolic pressure in 595G to 180s.  Asymptomatic.  No recent changes in her medications.  Systolic blood pressure has typically been in the 130s to 150s before the current episode.  No changes to her home blood pressure cuff.  Took an extra half tablet of losartan a couple of hours ago.  I advised the patient to rest and recheck her blood pressure in several hours.  She understands she should call back should she develop any new or concerning symptoms.  This note will be forwarded to the patient's outpatient cardiologist.   Marcie Mowers, MD Cardiology Fellow, PGY-6

## 2018-06-12 NOTE — Telephone Encounter (Signed)
New Message      Pt c/o BP issue: STAT if pt c/o blurred vision, one-sided weakness or slurred speech  1. What are your last 5 BP readings?    07/19/ @ 3:AM 152/91                             146/90                              143/89                               130/84 (Right now) 4:02pm                                172/84     2. Are you having any other symptoms (ex. Dizziness, headache, blurred vision, passed out)? Yes, a little pressure in head,dizziness 3. What is your BP issue? Blood pressure keeps fluctuating up and down.

## 2018-06-15 DIAGNOSIS — M75121 Complete rotator cuff tear or rupture of right shoulder, not specified as traumatic: Secondary | ICD-10-CM | POA: Diagnosis not present

## 2018-06-15 DIAGNOSIS — M25511 Pain in right shoulder: Secondary | ICD-10-CM | POA: Diagnosis not present

## 2018-06-15 DIAGNOSIS — M67911 Unspecified disorder of synovium and tendon, right shoulder: Secondary | ICD-10-CM | POA: Diagnosis not present

## 2018-06-15 NOTE — Telephone Encounter (Signed)
Agree with recommendations thanks °

## 2018-06-16 DIAGNOSIS — H35352 Cystoid macular degeneration, left eye: Secondary | ICD-10-CM | POA: Diagnosis not present

## 2018-06-16 DIAGNOSIS — H35072 Retinal telangiectasis, left eye: Secondary | ICD-10-CM | POA: Diagnosis not present

## 2018-06-16 DIAGNOSIS — H35071 Retinal telangiectasis, right eye: Secondary | ICD-10-CM | POA: Diagnosis not present

## 2018-06-16 DIAGNOSIS — H35351 Cystoid macular degeneration, right eye: Secondary | ICD-10-CM | POA: Diagnosis not present

## 2018-06-16 NOTE — Telephone Encounter (Signed)
Called to check on patient. Instructed her to call later this week with a list of blood pressure readings if controlled, or to call prior to that time if BP is still high.

## 2018-06-18 DIAGNOSIS — M25511 Pain in right shoulder: Secondary | ICD-10-CM | POA: Diagnosis not present

## 2018-06-18 DIAGNOSIS — M75121 Complete rotator cuff tear or rupture of right shoulder, not specified as traumatic: Secondary | ICD-10-CM | POA: Diagnosis not present

## 2018-06-19 NOTE — Telephone Encounter (Signed)
The patient states her BP today is 131/77. She is back to taking Losartan 25 mg daily. She will continue to monitor BP and call if it increases again. She was grateful for assistance.

## 2018-06-22 DIAGNOSIS — M75121 Complete rotator cuff tear or rupture of right shoulder, not specified as traumatic: Secondary | ICD-10-CM | POA: Diagnosis not present

## 2018-06-22 DIAGNOSIS — M25511 Pain in right shoulder: Secondary | ICD-10-CM | POA: Diagnosis not present

## 2018-06-23 DIAGNOSIS — I10 Essential (primary) hypertension: Secondary | ICD-10-CM | POA: Diagnosis not present

## 2018-06-23 DIAGNOSIS — J479 Bronchiectasis, uncomplicated: Secondary | ICD-10-CM | POA: Diagnosis not present

## 2018-06-26 ENCOUNTER — Telehealth: Payer: Self-pay | Admitting: *Deleted

## 2018-06-26 DIAGNOSIS — M810 Age-related osteoporosis without current pathological fracture: Secondary | ICD-10-CM

## 2018-06-26 NOTE — Telephone Encounter (Addendum)
Deductible Individual: $185 ($185 met)   OOP MAX Value (Amount Met)     Annual exam 12/01/17 TF  Calcium  9.6           Date  08/03/18  Upcoming dental procedures NO  Prior Authorization needed NO  Pt estimated Cost $0  Appt 08/13/18 '@2' :30     Coverage Details:Benefits subject to a $185 deductible ($185 met) and 20% co-insurance for the administration and cost of Prolia.

## 2018-07-14 DIAGNOSIS — M67911 Unspecified disorder of synovium and tendon, right shoulder: Secondary | ICD-10-CM | POA: Diagnosis not present

## 2018-08-01 ENCOUNTER — Other Ambulatory Visit: Payer: Self-pay | Admitting: Cardiology

## 2018-08-03 ENCOUNTER — Other Ambulatory Visit: Payer: Medicare Other

## 2018-08-03 DIAGNOSIS — M810 Age-related osteoporosis without current pathological fracture: Secondary | ICD-10-CM | POA: Diagnosis not present

## 2018-08-03 DIAGNOSIS — E559 Vitamin D deficiency, unspecified: Secondary | ICD-10-CM

## 2018-08-04 ENCOUNTER — Telehealth: Payer: Self-pay | Admitting: *Deleted

## 2018-08-04 LAB — VITAMIN D 25 HYDROXY (VIT D DEFICIENCY, FRACTURES): Vit D, 25-Hydroxy: 38 ng/mL (ref 30–100)

## 2018-08-04 LAB — CALCIUM: CALCIUM: 9.6 mg/dL (ref 8.6–10.4)

## 2018-08-04 NOTE — Telephone Encounter (Signed)
-----   Message from Ramond Craver, [place] sent at 08/04/2018 10:10 AM EDT ----- Regarding: Vit D is normal Patient said you were waiting on her VIT D result to proceed with Prolia.

## 2018-08-04 NOTE — Telephone Encounter (Signed)
Appt 08/13/18 @2 :30

## 2018-08-13 ENCOUNTER — Ambulatory Visit (INDEPENDENT_AMBULATORY_CARE_PROVIDER_SITE_OTHER): Payer: Medicare Other | Admitting: Gynecology

## 2018-08-13 DIAGNOSIS — M810 Age-related osteoporosis without current pathological fracture: Secondary | ICD-10-CM | POA: Diagnosis not present

## 2018-08-13 MED ORDER — DENOSUMAB 60 MG/ML ~~LOC~~ SOSY
60.0000 mg | PREFILLED_SYRINGE | Freq: Once | SUBCUTANEOUS | Status: AC
  Administered 2018-08-13: 60 mg via SUBCUTANEOUS

## 2018-08-13 NOTE — Telephone Encounter (Addendum)
Resubmitted Summary of benefits. Pt stated she has [employer] as a supplemental coverage.  Will bill patient, per pt will get injection today and pay bill later   Received new Summary of Benefits $ 0 copay

## 2018-08-18 ENCOUNTER — Telehealth: Payer: Self-pay | Admitting: *Deleted

## 2018-08-18 NOTE — Telephone Encounter (Signed)
Patient left a msg on the refill vm stating that she has been seeing warnings about losartan possibly causing cancer. She would like a call back at [phone] to discuss. Thanks, MI

## 2018-08-18 NOTE — Telephone Encounter (Signed)
Informed patient that her dispensing pharmacy has to tell her by law if her Losartan was manufactured by a company that distributed affected medicine. Instructed her to call her pharmacy if she'd like to double check. She was grateful for call back.

## 2018-09-01 NOTE — Telephone Encounter (Signed)
PROLIA GIVEN 08/13/18 NEXT INJECTION 02/12/2019

## 2018-09-13 ENCOUNTER — Other Ambulatory Visit: Payer: Self-pay | Admitting: Cardiovascular Disease

## 2018-09-18 DIAGNOSIS — Z23 Encounter for immunization: Secondary | ICD-10-CM | POA: Diagnosis not present

## 2018-09-20 ENCOUNTER — Other Ambulatory Visit: Payer: Self-pay | Admitting: Cardiovascular Disease

## 2018-09-23 ENCOUNTER — Encounter: Payer: Self-pay | Admitting: Gynecology

## 2018-10-05 ENCOUNTER — Emergency Department (INDEPENDENT_AMBULATORY_CARE_PROVIDER_SITE_OTHER)
Admission: EM | Admit: 2018-10-05 | Discharge: 2018-10-05 | Disposition: A | Discharge status: Home / Self Care | Payer: Medicare Other | Source: Home / Self Care | Attending: Family Medicine | Admitting: Family Medicine

## 2018-10-05 ENCOUNTER — Encounter: Payer: Self-pay | Admitting: Emergency Medicine

## 2018-10-05 ENCOUNTER — Other Ambulatory Visit: Payer: Self-pay

## 2018-10-05 DIAGNOSIS — M542 Cervicalgia: Secondary | ICD-10-CM

## 2018-10-05 DIAGNOSIS — M436 Torticollis: Secondary | ICD-10-CM

## 2018-10-05 DIAGNOSIS — R0981 Nasal congestion: Secondary | ICD-10-CM

## 2018-10-05 MED ORDER — CYCLOBENZAPRINE HCL 5 MG PO TABS: 5.0000 mg | ORAL_TABLET | Freq: Two times a day (BID) | ORAL | 0 refills | Status: DC | PRN

## 2018-10-05 NOTE — ED Provider Notes (Signed)
[name]    CSN: [id number] Arrival date & time: 10/05/18  1441     History   Chief Complaint Chief Complaint  Patient presents with  . Neck Pain  . Nasal Congestion    HPI Kristine [name] is a 77 y.o. female.   HPI  Kristine [name] is a 77 y.o. female presenting to UC with c/o 1 week nasal congestion, gradually improving the last 3 days. She is also c/o neck pain and stiffness that started after waking up this morning. She notes she has been sleeping in a recliner since July due to a Right rotator cuff injury. She believes she "slept wrong," resulting in the neck stiffness.  Denies recent injury of her neck. No heavy lifting or falls. She has tried a warm compress and Extra Strength Tylenol with mild relief. Denies weakness or numbness in arms or  Legs.  Denies fever, chills, n/v/d.       Past Medical History:  Diagnosis Date  . Anemia   . Arthritis   . Barrett's esophagus    Stage B  . CAD (coronary artery disease)    a. s/p STEMI 7/14 >> LHC: LM 20, mLAD 30-40, small Dx 100 (L-L collats), OM1 90, OM2 30-40, mRCA 50-60, Lat/AL akinesis, EF 35-40% >> PCI: Promus DES to OM1;  b. Myoview 3/16: diaph atten, no ischemia, EF 59% (intermediate risk b/c BP drop 152>>137, but recovery BP 162 (tech error) >> med Rx continued  c. cath 05/30/17 s/p FFR guided PCI with DES to circumflex/first OM   . CREST variant of scleroderma ([place])   . Cystocele   . Diverticulitis   . GERD (gastroesophageal reflux disease)   . History of blood transfusion    hx of being Anti-K positive  . History of Doppler ultrasound    a. Carotid US 2/12: bilateral ICA < 50%  . Hyperlipidemia   . Hypertension   . Ischemic cardiomyopathy    a. EF 35-40% by LHC at time of MI;  b. Echo 7/14:  Mild focal basal septal hypertrophy, EF 50-55%, apical HK, grade 1 diastolic dysfunction, trivial AI   . Ischemic colitis ([place])   . Osteoporosis 12/2017   T score -2.6  . Raynaud disease   . Rectocele    mild     Patient Active Problem List   Diagnosis Date Noted  . Psychophysiological insomnia 09/24/2017  . Abnormal nuclear cardiac imaging test 05/30/2017  . Abnormal stress echo   . Solar lentigo 11/08/2016  . Acute blood loss anemia 10/10/2015  . Colitis 10/08/2015  . CAD (coronary artery disease), native coronary artery 06/27/2013  . Hyperlipidemia 06/27/2013  . Precordial pain 06/26/2013  . History of ST elevation myocardial infarction (STEMI) 06/24/2013  . Hypertension   . GERD (gastroesophageal reflux disease)   . Barrett's esophagus   . Cystocele   . Rectocele     Past Surgical History:  Procedure Laterality Date  . ABDOMINAL HYSTERECTOMY  1996   RSO for menorrhagia.  . CORONARY ANGIOPLASTY WITH STENT PLACEMENT  06/21/2013  . CORONARY STENT INTERVENTION N/A 05/30/2017   Procedure: Coronary Stent Intervention;  Surgeon: [name], MD;  Location: [facility];  Service: Cardiovascular;  Laterality: N/A;  . FOOT SURGERY  2013   x2  . INTRAVASCULAR PRESSURE WIRE/FFR STUDY N/A 05/30/2017   Procedure: Intravascular Pressure Wire/FFR Study;  Surgeon: [name], MD;  Location: [facility];  Service: Cardiovascular;  Laterality: N/A;  . JOINT REPLACEMENT  9/10  rt total  . LEFT HEART CATH AND CORONARY ANGIOGRAPHY N/A 05/30/2017   Procedure: Left Heart Cath and Coronary Angiography;  Surgeon: [name], MD;  Location: [facility];  Service: Cardiovascular;  Laterality: N/A;  . nissen fundoplication  8099  . TONSILLECTOMY  1950  . TOTAL KNEE ARTHROPLASTY     right  . TUBAL LIGATION  1980    OB History    Gravida  1   Para  1   Term      Preterm      AB      Living  1     SAB      TAB      Ectopic      Multiple      Live Births               Home Medications    Prior to Admission medications   Medication Sig Start Date End Date Taking? Authorizing Provider  acetaminophen (TYLENOL) 500 MG tablet Take 1,000 mg by mouth  every 6 (six) hours as needed for headache.    [provider]  ALPRAZolam Duanne Moron) 0.25 MG tablet Take 1 tablet (0.25 mg total) by mouth daily as needed for anxiety. 09/17/17   Wardell Honour, MD  aspirin EC 81 MG EC tablet Take 1 tablet (81 mg total) by mouth daily. 06/24/13   Barrett, Evelene Croon, PA-C  cyclobenzaprine (FLEXERIL) 5 MG tablet Take 1 tablet (5 mg total) by mouth 2 (two) times daily as needed for muscle spasms. 10/05/18   Noe Gens, PA-C  ezetimibe (ZETIA) 10 MG tablet TAKE 1/2 TABLET(5 MG) BY MOUTH DAILY 08/03/18   [name], MD  losartan (COZAAR) 50 MG tablet TAKE ONE-HALF TABLET BY MOUTH DAILY 11/17/17   [name], MD  metoprolol succinate (TOPROL-XL) 25 MG 24 hr tablet TAKE 1/2 TABLET(12.5 MG) BY MOUTH DAILY 09/15/18   [name], MD  nitroGLYCERIN (NITROSTAT) 0.4 MG SL tablet Place 1 tablet (0.4 mg total) under the tongue every 5 (five) minutes x 3 doses as needed for chest pain. 09/17/17   Wardell Honour, MD  pantoprazole (PROTONIX) 40 MG tablet Take 40 mg by mouth daily.    [provider]  pravastatin (PRAVACHOL) 10 MG tablet TAKE 1 TABLET BY MOUTH EVERY EVENING 09/21/18   [name], MD  ranitidine (ZANTAC) 150 MG tablet Take 150 mg by mouth at bedtime.    [provider]    Family History Family History  Problem Relation Age of Onset  . Cancer Mother        Pancreatic Cancer  . Hepatitis C Mother   . Heart disease Father        congestive heart failure  . Heart attack Father   . Panic disorder Daughter   . Hypertension Daughter   . Asthma Maternal Grandmother   . Emphysema Maternal Grandmother   . Cancer Maternal Grandfather        lung  . Stroke Neg Hx     Social History Social History   Tobacco Use  . Smoking status: Never Smoker  . Smokeless tobacco: Never Used  Substance Use Topics  . Alcohol use: No    Alcohol/week: 0.0 standard drinks    Frequency: Never  . Drug use: No     Allergies    Aspirin; Pravastatin; Flagyl [metronidazole]; Nsaids; Pravastatin sodium; and Statins   Review of Systems Review of Systems  Constitutional: Negative for chills, fatigue and fever.  HENT: Positive for congestion and postnasal drip. Negative for rhinorrhea, sinus pressure, sinus pain and sore throat.   Respiratory: Positive for cough ( minimal). Negative for shortness of breath and wheezing.   Musculoskeletal: Positive for myalgias, neck pain and neck stiffness. Negative for arthralgias and back pain.  Skin: Negative for rash.  Neurological: Negative for dizziness, weakness, light-headedness, numbness and headaches.     Physical Exam Triage Vital Signs ED Triage Vitals [10/05/18 1554]  Enc Vitals Group     BP 122/71     Pulse Rate 83     Resp      Temp 98 F (36.7 C)     Temp Source Oral     SpO2 99 %     Weight 125 lb (56.7 kg)     Height 5\' 1"  (1.549 m)     Head Circumference      Peak Flow      Pain Score 10     Pain Loc      Pain Edu?      Excl. in [place]?    No data found.  Updated Vital Signs BP 122/71 (BP Location: Right Arm)   Pulse 83   Temp 98 F (36.7 C) (Oral)   Ht 5\' 1"  (1.549 m)   Wt 125 lb (56.7 kg)   LMP 11/25/1994   SpO2 99%   BMI 23.62 kg/m   Visual Acuity Right Eye Distance:   Left Eye Distance:   Bilateral Distance:    Right Eye Near:   Left Eye Near:    Bilateral Near:     Physical Exam  Constitutional: She is oriented to person, place, and time. She appears well-developed and well-nourished. No distress.  HENT:  Head: Normocephalic and atraumatic.  Right Ear: Tympanic membrane normal.  Left Ear: Tympanic membrane normal.  Nose: Nose normal. Right sinus exhibits no maxillary sinus tenderness and no frontal sinus tenderness. Left sinus exhibits no maxillary sinus tenderness and no frontal sinus tenderness.  Mouth/Throat: Uvula is midline, oropharynx is clear and moist and mucous membranes are normal.  Eyes: EOM are normal.  Neck: Neck  supple. Muscular tenderness present. No spinous process tenderness present. Decreased range of motion present.  No spinal tenderness. Tenderness to Left and Right side cervical muscles with palpable muscle spasms. Limited head rotation.   Cardiovascular: Normal rate and regular rhythm.  Pulmonary/Chest: Effort normal and breath sounds normal. No stridor. No respiratory distress. She has no wheezes. She has no rales.  Musculoskeletal:  No spinal tenderness. Full ROM upper and lower extremities bilaterally with 5/5 grip strength. No back tenderness.   Neurological: She is alert and oriented to person, place, and time.  Skin: Skin is warm and dry. No rash noted. She is not diaphoretic. No erythema.  Psychiatric: She has a normal mood and affect. Her behavior is normal.  Nursing note and vitals reviewed.    UC Treatments / Results  Labs (all labs ordered are listed, but only abnormal results are displayed) Labs Reviewed - No data to display  EKG None  Radiology No results found.  Procedures Procedures (including critical care time)  Medications Ordered in UC Medications - No data to display  Initial Impression / Assessment and Plan / UC Course  I have reviewed the triage vital signs and the nursing notes.  Pertinent labs & imaging results that were available during my care of the patient were reviewed by me and considered in my medical decision making (see chart for details).  Nasal c/w viral URI, reassuring congestion is gradually improving, suspect symptoms will be resolved by the end of the week.  Neck pain c/w muscle spasm. Doubt meningitis or other emergent process taking place. Home care instructions provided.  Final Clinical Impressions(s) / UC Diagnoses   Final diagnoses:  Acute muscle stiffness of neck  Nasal congestion     Discharge Instructions      Your nasal congestion is likely due to a common cold and should continue to improve over the next few  days.  You may continue taking Extra Strength Tylenol for your neck pain. And you may try the prescribed flexeril in the evening to help with neck stiffness. Flexeril (cyclobenzaprine) is a muscle relaxer and may cause drowsiness. Do not drink alcohol, drive, or operate heavy machinery while taking.  Please call to schedule a follow up exam with Sports Medicine later this week if not improving.     ED Prescriptions    Medication Sig Dispense Auth. Provider   cyclobenzaprine (FLEXERIL) 5 MG tablet Take 1 tablet (5 mg total) by mouth 2 (two) times daily as needed for muscle spasms. 10 tablet Noe Gens, PA-C     Controlled Substance Prescriptions [place] Controlled Substance Registry consulted? Not Applicable   Tyrell Antonio 10/05/18 [id number]

## 2018-10-05 NOTE — ED Triage Notes (Signed)
Nasal congestion x 3 days, Stiff neck woke up this am

## 2018-10-05 NOTE — Discharge Instructions (Signed)
°  Your nasal congestion is likely due to a common cold and should continue to improve over the next few days.  You may continue taking Extra Strength Tylenol for your neck pain. And you may try the prescribed flexeril in the evening to help with neck stiffness. Flexeril (cyclobenzaprine) is a muscle relaxer and may cause drowsiness. Do not drink alcohol, drive, or operate heavy machinery while taking.  Please call to schedule a follow up exam with Sports Medicine later this week if not improving.

## 2018-10-30 ENCOUNTER — Other Ambulatory Visit: Payer: Self-pay

## 2018-10-30 ENCOUNTER — Emergency Department (INDEPENDENT_AMBULATORY_CARE_PROVIDER_SITE_OTHER)
Admission: EM | Admit: 2018-10-30 | Discharge: 2018-10-30 | Disposition: A | Discharge status: Home / Self Care | Payer: Medicare Other | Source: Home / Self Care

## 2018-10-30 ENCOUNTER — Encounter: Payer: Self-pay | Admitting: *Deleted

## 2018-10-30 DIAGNOSIS — J019 Acute sinusitis, unspecified: Secondary | ICD-10-CM

## 2018-10-30 DIAGNOSIS — J069 Acute upper respiratory infection, unspecified: Secondary | ICD-10-CM | POA: Diagnosis not present

## 2018-10-30 DIAGNOSIS — B9789 Other viral agents as the cause of diseases classified elsewhere: Secondary | ICD-10-CM

## 2018-10-30 DIAGNOSIS — B9689 Other specified bacterial agents as the cause of diseases classified elsewhere: Secondary | ICD-10-CM | POA: Diagnosis not present

## 2018-10-30 MED ORDER — CEFDINIR 300 MG PO CAPS: 600.0000 mg | ORAL_CAPSULE | Freq: Every day | ORAL | 0 refills | Status: DC

## 2018-10-30 NOTE — ED Provider Notes (Signed)
[name]    CSN: [id number] Arrival date & time: 10/30/18  1114     History   Chief Complaint Chief Complaint  Patient presents with  . Cough    HPI Kristine [name] is a 77 y.o. female.   HPI 77 year old lady, formerly a patient of Dr. Nena [name], who comes in here today with a respiratory infection that is been going on for 3 weeks.  She initially had a sore throat, then it went down into her chest and up into her head.  Now she is having more symptoms with a fever of 101 earlier this week.  She is coughing up green mucus.  Blowing a lot out of her nose.  Getting worse rather than getting better.  She does not smoke.  She worries about the amount of antibiotics that her dentist has her own recently for doing a root canal, amoxicillin. Past Medical History:  Diagnosis Date  . Anemia   . Arthritis   . Barrett's esophagus    Stage B  . CAD (coronary artery disease)    a. s/p STEMI 7/14 >> LHC: LM 20, mLAD 30-40, small Dx 100 (L-L collats), OM1 90, OM2 30-40, mRCA 50-60, Lat/AL akinesis, EF 35-40% >> PCI: Promus DES to OM1;  b. Myoview 3/16: diaph atten, no ischemia, EF 59% (intermediate risk b/c BP drop 152>>137, but recovery BP 162 (tech error) >> med Rx continued  c. cath 05/30/17 s/p FFR guided PCI with DES to circumflex/first OM   . CREST variant of scleroderma ([place])   . Cystocele   . Diverticulitis   . GERD (gastroesophageal reflux disease)   . History of blood transfusion    hx of being Anti-K positive  . History of Doppler ultrasound    a. Carotid US 2/12: bilateral ICA < 50%  . Hyperlipidemia   . Hypertension   . Ischemic cardiomyopathy    a. EF 35-40% by LHC at time of MI;  b. Echo 7/14:  Mild focal basal septal hypertrophy, EF 50-55%, apical HK, grade 1 diastolic dysfunction, trivial AI   . Ischemic colitis ([place])   . Osteoporosis 12/2017   T score -2.6  . Raynaud disease   . Rectocele    mild    Patient Active Problem List   Diagnosis Date Noted  .  Psychophysiological insomnia 09/24/2017  . Abnormal nuclear cardiac imaging test 05/30/2017  . Abnormal stress echo   . Solar lentigo 11/08/2016  . Acute blood loss anemia 10/10/2015  . Colitis 10/08/2015  . CAD (coronary artery disease), native coronary artery 06/27/2013  . Hyperlipidemia 06/27/2013  . Precordial pain 06/26/2013  . History of ST elevation myocardial infarction (STEMI) 06/24/2013  . Hypertension   . GERD (gastroesophageal reflux disease)   . Barrett's esophagus   . Cystocele   . Rectocele     Past Surgical History:  Procedure Laterality Date  . ABDOMINAL HYSTERECTOMY  1996   RSO for menorrhagia.  . CORONARY ANGIOPLASTY WITH STENT PLACEMENT  06/21/2013  . CORONARY STENT INTERVENTION N/A 05/30/2017   Procedure: Coronary Stent Intervention;  Surgeon: [name], MD;  Location: [facility];  Service: Cardiovascular;  Laterality: N/A;  . FOOT SURGERY  2013   x2  . INTRAVASCULAR PRESSURE WIRE/FFR STUDY N/A 05/30/2017   Procedure: Intravascular Pressure Wire/FFR Study;  Surgeon: [name], MD;  Location: [facility];  Service: Cardiovascular;  Laterality: N/A;  . JOINT REPLACEMENT  9/10   rt total  . LEFT HEART  CATH AND CORONARY ANGIOGRAPHY N/A 05/30/2017   Procedure: Left Heart Cath and Coronary Angiography;  Surgeon: [name], MD;  Location: [facility];  Service: Cardiovascular;  Laterality: N/A;  . nissen fundoplication  3875  . TONSILLECTOMY  1950  . TOTAL KNEE ARTHROPLASTY     right  . TUBAL LIGATION  1980    OB History    Gravida  1   Para  1   Term      Preterm      AB      Living  1     SAB      TAB      Ectopic      Multiple      Live Births               Home Medications    Prior to Admission medications   Medication Sig Start Date End Date Taking? Authorizing Provider  acetaminophen (TYLENOL) 500 MG tablet Take 1,000 mg by mouth every 6 (six) hours as needed for headache.    [provider]  ALPRAZolam Duanne Moron) 0.25 MG tablet Take 1 tablet (0.25 mg total) by mouth daily as needed for anxiety. 09/17/17   Wardell Honour, MD  aspirin EC 81 MG EC tablet Take 1 tablet (81 mg total) by mouth daily. 06/24/13   Barrett, Evelene Croon, PA-C  cyclobenzaprine (FLEXERIL) 5 MG tablet Take 1 tablet (5 mg total) by mouth 2 (two) times daily as needed for muscle spasms. 10/05/18   Noe Gens, PA-C  ezetimibe (ZETIA) 10 MG tablet TAKE 1/2 TABLET(5 MG) BY MOUTH DAILY 08/03/18   [name], MD  losartan (COZAAR) 50 MG tablet TAKE ONE-HALF TABLET BY MOUTH DAILY 11/17/17   [name], MD  metoprolol succinate (TOPROL-XL) 25 MG 24 hr tablet TAKE 1/2 TABLET(12.5 MG) BY MOUTH DAILY 09/15/18   [name], MD  nitroGLYCERIN (NITROSTAT) 0.4 MG SL tablet Place 1 tablet (0.4 mg total) under the tongue every 5 (five) minutes x 3 doses as needed for chest pain. 09/17/17   Wardell Honour, MD  pantoprazole (PROTONIX) 40 MG tablet Take 40 mg by mouth daily.    [provider]  pravastatin (PRAVACHOL) 10 MG tablet TAKE 1 TABLET BY MOUTH EVERY EVENING 09/21/18   [name], MD  ranitidine (ZANTAC) 150 MG tablet Take 150 mg by mouth at bedtime.    [provider]    Family History Family History  Problem Relation Age of Onset  . Cancer Mother        Pancreatic Cancer  . Hepatitis C Mother   . Heart disease Father        congestive heart failure  . Heart attack Father   . Panic disorder Daughter   . Hypertension Daughter   . Asthma Maternal Grandmother   . Emphysema Maternal Grandmother   . Cancer Maternal Grandfather        lung  . Stroke Neg Hx     Social History Social History   Tobacco Use  . Smoking status: Never Smoker  . Smokeless tobacco: Never Used  Substance Use Topics  . Alcohol use: No    Alcohol/week: 0.0 standard drinks    Frequency: Never  . Drug use: No     Allergies   Aspirin; Pravastatin; Flagyl [metronidazole]; Nsaids;  Pravastatin sodium; and Statins   Review of Systems Review of Systems Constitutional: Generalized malaise HEENT: Sinus pressure and nasal drainage.  Ears are okay.  Throat was sore but is doing better. Respiratory: Cough with green mucus production.  Coughing all day and night.  Taking OTC Robitussin-DM GI: Unremarkable Cardiovascular: Unremarkable  Physical Exam Triage Vital Signs ED Triage Vitals  Enc Vitals Group     BP      Pulse      Resp      Temp      Temp src      SpO2      Weight      Height      Head Circumference      Peak Flow      Pain Score      Pain Loc      Pain Edu?      Excl. in [place]?    No data found.  Updated Vital Signs BP 135/78 (BP Location: Right Arm)   Pulse 97   Temp 99.2 F (37.3 C) (Oral)   Resp 16   Ht 5\' 1"  (1.549 m)   Wt 56.2 kg   LMP 11/25/1994   SpO2 99%   BMI 23.43 kg/m   Visual Acuity Right Eye Distance:   Left Eye Distance:   Bilateral Distance:    Right Eye Near:   Left Eye Near:    Bilateral Near:     Physical Exam   UC Treatments / Results  Labs (all labs ordered are listed, but only abnormal results are displayed) Labs Reviewed - No data to display  EKG None  Radiology No results found.  Procedures Procedures (including critical care time)  Medications Ordered in UC Medications - No data to display  Initial Impression / Assessment and Plan / UC Course  I have reviewed the triage vital signs and the nursing notes.  Pertinent labs & imaging results that were available during my care of the patient were reviewed by me and considered in my medical decision making (see chart for details).     Viral URI with secondary rhinosinusitis and cough from that. Final Clinical Impressions(s) / UC Diagnoses   Final diagnoses:  Viral URI with cough  Acute bacterial rhinosinusitis     Discharge Instructions     Drink plenty of fluids and get enough rest  Continue using the over-the-counter Robitussin-DM  with decongestant that you have been using.  Take Omnicef (cefdinir) 300 mg 1 twice daily for infection  Take Tylenol or ibuprofen as needed for fever or aching gout  If you get more congestion and coughing and shortness of breath get rechecked, but I would expect you to start feeling better in a couple of days.  Return as needed    ED Prescriptions    None     Controlled Substance Prescriptions [place] Controlled Substance Registry consulted? No   Posey Boyer, MD 10/30/18 [phone]

## 2018-10-30 NOTE — ED Triage Notes (Signed)
Pt c/o productive cough with green phlegm x 6 days. She is taking Robt DM. She reports temp 101 at onset, none since. She completed ABT from her dentist 3 wks ago for root canal.

## 2018-10-30 NOTE — Discharge Instructions (Addendum)
Drink plenty of fluids and get enough rest  Continue using the over-the-counter Robitussin-DM with decongestant that you have been using.  Take Omnicef (cefdinir) 300 mg 1 twice daily for infection  Take Tylenol or ibuprofen as needed for fever or aching gout  If you get more congestion and coughing and shortness of breath get rechecked, but I would expect you to start feeling better in a couple of days.  Return as needed

## 2018-11-07 ENCOUNTER — Other Ambulatory Visit: Payer: Self-pay | Admitting: Cardiovascular Disease

## 2018-11-07 DIAGNOSIS — I10 Essential (primary) hypertension: Secondary | ICD-10-CM

## 2018-11-09 ENCOUNTER — Emergency Department (INDEPENDENT_AMBULATORY_CARE_PROVIDER_SITE_OTHER)
Admission: EM | Admit: 2018-11-09 | Discharge: 2018-11-09 | Disposition: A | Discharge status: Home / Self Care | Payer: Medicare Other | Source: Home / Self Care

## 2018-11-09 ENCOUNTER — Other Ambulatory Visit: Payer: Self-pay

## 2018-11-09 ENCOUNTER — Encounter: Payer: Self-pay | Admitting: Family Medicine

## 2018-11-09 DIAGNOSIS — J40 Bronchitis, not specified as acute or chronic: Secondary | ICD-10-CM

## 2018-11-09 MED ORDER — BENZONATATE 100 MG PO CAPS: 100.0000 mg | ORAL_CAPSULE | Freq: Three times a day (TID) | ORAL | 0 refills | Status: DC | PRN

## 2018-11-09 MED ORDER — PREDNISONE 20 MG PO TABS: ORAL_TABLET | ORAL | 1 refills | Status: DC

## 2018-11-09 NOTE — Discharge Instructions (Addendum)
The bronchitis should resolve over the next 3-5 days.  Take your medicine with food or a large glass of water.

## 2018-11-09 NOTE — ED Provider Notes (Signed)
[name]    CSN: [id number] Arrival date & time: 11/09/18  1352     History   Chief Complaint Chief Complaint  Patient presents with  . Cough    HPI Kristine [name] is a 77 y.o. female.   77 yo woman with a month of cough.  Seen here 10 days ago and treated with Keflex.  Cough persists.  No shortness of breath, fever, or chest pain.  Color of phlegm is now yellow.  No h/o asthma but she has had 4 episodes of bronchitis in the past year since she retired.   Note from 23/37: 77 year old lady, formerly a patient of Dr. Nena [name], who comes in here today with a respiratory infection that is been going on for 3 weeks.  She initially had a sore throat, then it went down into her chest and up into her head.  Now she is having more symptoms with a fever of 101 earlier this week.  She is coughing up green mucus.  Blowing a lot out of her nose.  Getting worse rather than getting better.  She does not smoke.  She worries about the amount of antibiotics that her dentist has her own recently for doing a root canal, amoxicillin.  Patient's other problems include GERD with Barrett's esophagus, CREST syndrome,  hypertension, CAD, h/o acute blood loss, hyperlipidemia, and insomnia     Past Medical History:  Diagnosis Date  . Anemia   . Arthritis   . Barrett's esophagus    Stage B  . CAD (coronary artery disease)    a. s/p STEMI 7/14 >> LHC: LM 20, mLAD 30-40, small Dx 100 (L-L collats), OM1 90, OM2 30-40, mRCA 50-60, Lat/AL akinesis, EF 35-40% >> PCI: Promus DES to OM1;  b. Myoview 3/16: diaph atten, no ischemia, EF 59% (intermediate risk b/c BP drop 152>>137, but recovery BP 162 (tech error) >> med Rx continued  c. cath 05/30/17 s/p FFR guided PCI with DES to circumflex/first OM   . CREST variant of scleroderma ([place])   . Cystocele   . Diverticulitis   . GERD (gastroesophageal reflux disease)   . History of blood transfusion    hx of being Anti-K positive  . History of Doppler  ultrasound    a. Carotid US 2/12: bilateral ICA < 50%  . Hyperlipidemia   . Hypertension   . Ischemic cardiomyopathy    a. EF 35-40% by LHC at time of MI;  b. Echo 7/14:  Mild focal basal septal hypertrophy, EF 50-55%, apical HK, grade 1 diastolic dysfunction, trivial AI   . Ischemic colitis ([place])   . Osteoporosis 12/2017   T score -2.6  . Raynaud disease   . Rectocele    mild    Patient Active Problem List   Diagnosis Date Noted  . Psychophysiological insomnia 09/24/2017  . Abnormal nuclear cardiac imaging test 05/30/2017  . Abnormal stress echo   . Solar lentigo 11/08/2016  . Acute blood loss anemia 10/10/2015  . Colitis 10/08/2015  . CAD (coronary artery disease), native coronary artery 06/27/2013  . Hyperlipidemia 06/27/2013  . Precordial pain 06/26/2013  . History of ST elevation myocardial infarction (STEMI) 06/24/2013  . Hypertension   . GERD (gastroesophageal reflux disease)   . Barrett's esophagus   . Cystocele   . Rectocele     Past Surgical History:  Procedure Laterality Date  . ABDOMINAL HYSTERECTOMY  1996   RSO for menorrhagia.  . CORONARY ANGIOPLASTY WITH STENT PLACEMENT  06/21/2013  .  CORONARY STENT INTERVENTION N/A 05/30/2017   Procedure: Coronary Stent Intervention;  Surgeon: [name], MD;  Location: [facility];  Service: Cardiovascular;  Laterality: N/A;  . FOOT SURGERY  2013   x2  . INTRAVASCULAR PRESSURE WIRE/FFR STUDY N/A 05/30/2017   Procedure: Intravascular Pressure Wire/FFR Study;  Surgeon: [name], MD;  Location: [facility];  Service: Cardiovascular;  Laterality: N/A;  . JOINT REPLACEMENT  9/10   rt total  . LEFT HEART CATH AND CORONARY ANGIOGRAPHY N/A 05/30/2017   Procedure: Left Heart Cath and Coronary Angiography;  Surgeon: [name], MD;  Location: [facility];  Service: Cardiovascular;  Laterality: N/A;  . nissen fundoplication  0347  . TONSILLECTOMY  1950  . TOTAL KNEE ARTHROPLASTY     right  .  TUBAL LIGATION  1980    OB History    Gravida  1   Para  1   Term      Preterm      AB      Living  1     SAB      TAB      Ectopic      Multiple      Live Births               Home Medications    Prior to Admission medications   Medication Sig Start Date End Date Taking? Authorizing Provider  acetaminophen (TYLENOL) 500 MG tablet Take 1,000 mg by mouth every 6 (six) hours as needed for headache.    [provider]  ALPRAZolam Duanne Moron) 0.25 MG tablet Take 1 tablet (0.25 mg total) by mouth daily as needed for anxiety. 09/17/17   Wardell Honour, MD  aspirin EC 81 MG EC tablet Take 1 tablet (81 mg total) by mouth daily. 06/24/13   Barrett, Evelene Croon, PA-C  benzonatate (TESSALON) 100 MG capsule Take 1-2 capsules (100-200 mg total) by mouth 3 (three) times daily as needed for cough. 11/09/18   Robyn Haber, MD  cyclobenzaprine (FLEXERIL) 5 MG tablet Take 1 tablet (5 mg total) by mouth 2 (two) times daily as needed for muscle spasms. 10/05/18   Noe Gens, PA-C  ezetimibe (ZETIA) 10 MG tablet TAKE 1/2 TABLET(5 MG) BY MOUTH DAILY 08/03/18   [name], MD  losartan (COZAAR) 50 MG tablet TAKE 1/2 TABLET BY MOUTH EVERY DAY 11/09/18   [name], MD  metoprolol succinate (TOPROL-XL) 25 MG 24 hr tablet TAKE 1/2 TABLET(12.5 MG) BY MOUTH DAILY 09/15/18   [name], MD  nitroGLYCERIN (NITROSTAT) 0.4 MG SL tablet Place 1 tablet (0.4 mg total) under the tongue every 5 (five) minutes x 3 doses as needed for chest pain. 09/17/17   Wardell Honour, MD  pravastatin (PRAVACHOL) 10 MG tablet TAKE 1 TABLET BY MOUTH EVERY EVENING 09/21/18   [name], MD  predniSONE (DELTASONE) 20 MG tablet 2 daily with food 11/09/18   Robyn Haber, MD    Family History Family History  Problem Relation Age of Onset  . Cancer Mother        Pancreatic Cancer  . Hepatitis C Mother   . Heart disease Father        congestive heart failure  . Heart attack Father   .  Panic disorder Daughter   . Hypertension Daughter   . Asthma Maternal Grandmother   . Emphysema Maternal Grandmother   . Cancer Maternal Grandfather        lung  .  Stroke Neg Hx     Social History Social History   Tobacco Use  . Smoking status: Never Smoker  . Smokeless tobacco: Never Used  Substance Use Topics  . Alcohol use: No    Alcohol/week: 0.0 standard drinks    Frequency: Never  . Drug use: No     Allergies   Aspirin; Pravastatin; Flagyl [metronidazole]; Nsaids; Pravastatin sodium; and Statins   Review of Systems Review of Systems   Physical Exam Triage Vital Signs ED Triage Vitals  Enc Vitals Group     BP      Pulse      Resp      Temp      Temp src      SpO2      Weight      Height      Head Circumference      Peak Flow      Pain Score      Pain Loc      Pain Edu?      Excl. in [place]?    No data found.  Updated Vital Signs BP (!) 141/80 (BP Location: Right Arm)   Pulse 71   Temp 98.6 F (37 C) (Oral)   Resp 18   Ht 5\' 1"  (1.549 m)   Wt 56.7 kg   LMP 11/25/1994   SpO2 100%   BMI 23.62 kg/m    Physical Exam Vitals signs and nursing note reviewed.  Constitutional:      General: She is not in acute distress.    Appearance: Normal appearance. She is normal weight. She is not ill-appearing, toxic-appearing or diaphoretic.  HENT:     Head: Normocephalic.     Right Ear: Tympanic membrane, ear canal and external ear normal.     Left Ear: Tympanic membrane, ear canal and external ear normal.     Nose: Nose normal.     Mouth/Throat:     Mouth: Mucous membranes are moist.     Pharynx: Oropharynx is clear.  Eyes:     Conjunctiva/sclera: Conjunctivae normal.     Pupils: Pupils are equal, round, and reactive to light.  Neck:     Musculoskeletal: Normal range of motion and neck supple. No muscular tenderness.  Cardiovascular:     Rate and Rhythm: Normal rate.     Heart sounds: Normal heart sounds.  Pulmonary:     Effort: Pulmonary effort  is normal.     Comments: Coarse breath sounds Musculoskeletal: Normal range of motion.  Lymphadenopathy:     Cervical: No cervical adenopathy.  Skin:    General: Skin is warm and dry.  Neurological:     General: No focal deficit present.     Mental Status: She is alert.  Psychiatric:        Mood and Affect: Mood normal.        Thought Content: Thought content normal.      UC Treatments / Results  Labs (all labs ordered are listed, but only abnormal results are displayed) Labs Reviewed - No data to display  EKG None  Radiology No results found.  Procedures Procedures (including critical care time)  Medications Ordered in UC Medications - No data to display  Initial Impression / Assessment and Plan / UC Course  I have reviewed the triage vital signs and the nursing notes.  Pertinent labs & imaging results that were available during my care of the patient were reviewed by me and considered in my medical decision  making (see chart for details).    Final Clinical Impressions(s) / UC Diagnoses   Final diagnoses:  Bronchitis     Discharge Instructions     The bronchitis should resolve over the next 3-5 days.  Take your medicine with food or a large glass of water.    ED Prescriptions    Medication Sig Dispense Auth. Provider   predniSONE (DELTASONE) 20 MG tablet 2 daily with food 10 tablet Robyn Haber, MD   benzonatate (TESSALON) 100 MG capsule Take 1-2 capsules (100-200 mg total) by mouth 3 (three) times daily as needed for cough. 40 capsule Robyn Haber, MD     Controlled Substance Prescriptions [place] Controlled Substance Registry consulted? Not Applicable   Robyn Haber, MD 11/09/18 1451

## 2018-11-09 NOTE — ED Triage Notes (Addendum)
Pt c/o productive cough and nasal congestion x 3 wks. Seen by Dr Linna Darner 10 days ago given [facility] without improvement.

## 2018-11-23 ENCOUNTER — Other Ambulatory Visit: Payer: Self-pay | Admitting: Cardiovascular Disease

## 2018-11-23 DIAGNOSIS — I10 Essential (primary) hypertension: Secondary | ICD-10-CM

## 2018-11-23 MED ORDER — EZETIMIBE 10 MG PO TABS: ORAL_TABLET | ORAL | 1 refills | Status: DC

## 2018-11-23 MED ORDER — METOPROLOL SUCCINATE ER 25 MG PO TB24: ORAL_TABLET | ORAL | 1 refills | Status: DC

## 2018-11-23 MED ORDER — LOSARTAN POTASSIUM 50 MG PO TABS: 25.0000 mg | ORAL_TABLET | Freq: Every day | ORAL | 1 refills | Status: DC

## 2018-11-23 MED ORDER — PRAVASTATIN SODIUM 10 MG PO TABS: 10.0000 mg | ORAL_TABLET | Freq: Every evening | ORAL | 1 refills | Status: DC

## 2018-11-23 NOTE — Telephone Encounter (Signed)
Pt's medications were sent to pt's pharmacy as requested. Confirmation received.  

## 2018-11-27 ENCOUNTER — Other Ambulatory Visit: Payer: Self-pay | Admitting: *Deleted

## 2018-11-27 DIAGNOSIS — I10 Essential (primary) hypertension: Secondary | ICD-10-CM

## 2018-11-27 MED ORDER — METOPROLOL SUCCINATE ER 25 MG PO TB24: ORAL_TABLET | ORAL | 1 refills | Status: DC

## 2018-11-27 MED ORDER — PRAVASTATIN SODIUM 10 MG PO TABS: 10.0000 mg | ORAL_TABLET | Freq: Every evening | ORAL | 1 refills | Status: DC

## 2018-11-27 MED ORDER — LOSARTAN POTASSIUM 50 MG PO TABS: 25.0000 mg | ORAL_TABLET | Freq: Every day | ORAL | 1 refills | Status: DC

## 2018-11-27 MED ORDER — EZETIMIBE 10 MG PO TABS: ORAL_TABLET | ORAL | 1 refills | Status: DC

## 2018-12-03 ENCOUNTER — Encounter: Payer: Medicare Other | Admitting: Gynecology

## 2019-01-04 ENCOUNTER — Other Ambulatory Visit: Payer: Self-pay | Admitting: Cardiovascular Disease

## 2019-01-04 DIAGNOSIS — I2510 Atherosclerotic heart disease of native coronary artery without angina pectoris: Secondary | ICD-10-CM

## 2019-01-04 MED ORDER — NITROGLYCERIN 0.4 MG SL SUBL: 0.4000 mg | SUBLINGUAL_TABLET | SUBLINGUAL | 4 refills | Status: DC | PRN

## 2019-01-04 NOTE — Telephone Encounter (Signed)
Pt's medication was sent to pt's pharmacy as requested. Confirmation received.  °

## 2019-01-08 ENCOUNTER — Other Ambulatory Visit: Payer: Self-pay | Admitting: Cardiovascular Disease

## 2019-01-08 MED ORDER — METOPROLOL SUCCINATE ER 25 MG PO TB24: ORAL_TABLET | ORAL | 1 refills | Status: DC

## 2019-01-08 NOTE — Telephone Encounter (Signed)
Pt's medication was sent to pt's local pharmacy as requested. Confirmation received.  

## 2019-01-12 ENCOUNTER — Encounter: Payer: Self-pay | Admitting: Gynecology

## 2019-01-12 ENCOUNTER — Ambulatory Visit: Payer: Medicare PPO | Admitting: Gynecology

## 2019-01-12 VITALS — BP 122/74 | Ht 61.0 in | Wt 128.0 lb

## 2019-01-12 DIAGNOSIS — Z01419 Encounter for gynecological examination (general) (routine) without abnormal findings: Secondary | ICD-10-CM | POA: Diagnosis not present

## 2019-01-12 DIAGNOSIS — N952 Postmenopausal atrophic vaginitis: Secondary | ICD-10-CM

## 2019-01-12 DIAGNOSIS — M810 Age-related osteoporosis without current pathological fracture: Secondary | ICD-10-CM

## 2019-01-12 DIAGNOSIS — N8111 Cystocele, midline: Secondary | ICD-10-CM

## 2019-01-12 NOTE — Patient Instructions (Signed)
Continue on the Prolia injections every 6 months.  Schedule your mammogram.  Follow-up in 1 year for annual exam

## 2019-01-12 NOTE — Progress Notes (Signed)
    Delisha Peaden August 28, 1941 [id number]        78 y.o.  G1P1 for breast and pelvic exam  Past medical history,surgical history, problem list, medications, allergies, family history and social history were all reviewed and documented as reviewed in the EPIC chart.  ROS:  Performed with pertinent positives and negatives included in the history, assessment and plan.   Additional significant findings : None   Exam: Caryn Bee assistant Vitals:   01/12/19 1125  BP: 122/74  Weight: 128 lb (58.1 kg)  Height: 5\' 1"  (1.549 m)   Body mass index is 24.19 kg/m.  General appearance:  Normal affect, orientation and appearance. Skin: Grossly normal HEENT: Without gross lesions.  No cervical or supraclavicular adenopathy. Thyroid normal.  Lungs:  Clear without wheezing, rales or rhonchi Cardiac: RR, without RMG Abdominal:  Soft, nontender, without masses, guarding, rebound, organomegaly or hernia Breasts:  Examined lying and sitting without masses, retractions, discharge or axillary adenopathy. Pelvic:  Ext, BUS, Vagina: With atrophic changes.  Second-degree cystocele noted.  Adnexa: Without masses or tenderness    Anus and perineum: Normal   Rectovaginal: Normal sphincter tone without palpated masses or tenderness.    Assessment/Plan:  78 y.o. G1P1 female for some pelvic exam.  1. Postmenopausal/atrophic genital changes.  Status post TAH in the past.  No significant menopausal symptoms. 2. Cystocele.  Had trial of pessary previously but does not like wearing it.  She is asymptomatic at this point and prefers just to monitor.  Exam is stable.  We will follow-up if symptoms become an issue. 3. Mammography due now and patient agrees to call and schedule.  Breast exam normal today. 4. Pap smear 2019.  No Pap smear done today.  No history of abnormal Pap smears. 5. Colonoscopy 2019.  Repeat at their recommended interval. 6. Osteoporosis.  DEXA 2019 T score -2.6.  Started on Prolia this past year  is doing well.  Will continue on Prolia for now and plan on repeat DEXA next year at 2-year interval. 7. Health maintenance.  No routine lab work done as patient does this elsewhere.  Follow-up 1 year, sooner as needed.   Anastasio Auerbach MD, 11:53 AM 01/12/2019

## 2019-01-20 ENCOUNTER — Telehealth: Payer: Self-pay | Admitting: *Deleted

## 2019-01-20 NOTE — Telephone Encounter (Addendum)
Deductible $198($48mt)  OOP MAX Amount Met  Annual exam  01/12/2019  Calcium   9.6    Date 08/03/18  Upcoming dental procedures   Prior Authorization needed NO  Pt estimated Cost? $222   APPT 03/17/2019 @ 2:30     Coverage Details: 20% one dose 20% admin fee Per amigen the coverage [employer] medicare replaces the Medicare A&B

## 2019-01-29 ENCOUNTER — Emergency Department (INDEPENDENT_AMBULATORY_CARE_PROVIDER_SITE_OTHER)
Admission: EM | Admit: 2019-01-29 | Discharge: 2019-01-29 | Disposition: A | Discharge status: Home / Self Care | Payer: Medicare PPO | Source: Home / Self Care

## 2019-01-29 ENCOUNTER — Other Ambulatory Visit: Payer: Self-pay

## 2019-01-29 DIAGNOSIS — M542 Cervicalgia: Secondary | ICD-10-CM

## 2019-01-29 DIAGNOSIS — M62838 Other muscle spasm: Secondary | ICD-10-CM | POA: Diagnosis not present

## 2019-01-29 MED ORDER — TRAMADOL HCL 50 MG PO TABS: 50.0000 mg | ORAL_TABLET | Freq: Three times a day (TID) | ORAL | 0 refills | Status: DC | PRN

## 2019-01-29 NOTE — Discharge Instructions (Signed)
°  Tramadol is strong pain medication. While taking, do not drink alcohol, drive, or perform any other activities that requires focus while taking these medications.   Please schedule a follow up appointment with your orthopedist for further evaluation and treatment of your symptoms.

## 2019-01-29 NOTE — ED Triage Notes (Signed)
Pt c/o severe neck pain x 2 days. Very stiff and LOM. Also c/o runny nose and having to constantly clear her throat. Takes tylenol prn.

## 2019-01-29 NOTE — ED Provider Notes (Signed)
[name]    CSN: [id number] Arrival date & time: 01/29/19  1227     History   Chief Complaint Chief Complaint  Patient presents with  . Neck Pain    HPI Nikky Duba is a 78 y.o. female.   HPI  Alma Mohiuddin is a 79 y.o. female presenting to UC with hx of cervical DDD c/o 2 days of neck stiffness and soreness. She thinks she may have slept wrong. She reports being under a lot of stress financially after retiring and not as active with dance and other activities because of this. She has trouble sleeping through the night, tossing and turning a lot.  Hx of neck pain/stiffness in the past. She had flexeril in the past but stated that did not help much.  She did have relief from acupuncture in the past but her acupuncturist is in [place]. She is wondering if there is anything she can do today to help her neck pain. She notes she had Right rotator cuff surgery several months ago and went to physical therapy. She is seen by Dr. Berenice Primas but has not f/u in about 3 months. She was advised to f/u next year.  Denies weakness or numbness in her arms or legs. No known injury.     Past Medical History:  Diagnosis Date  . Anemia   . Arthritis   . Barrett's esophagus    Stage B  . CAD (coronary artery disease)    a. s/p STEMI 7/14 >> LHC: LM 20, mLAD 30-40, small Dx 100 (L-L collats), OM1 90, OM2 30-40, mRCA 50-60, Lat/AL akinesis, EF 35-40% >> PCI: Promus DES to OM1;  b. Myoview 3/16: diaph atten, no ischemia, EF 59% (intermediate risk b/c BP drop 152>>137, but recovery BP 162 (tech error) >> med Rx continued  c. cath 05/30/17 s/p FFR guided PCI with DES to circumflex/first OM   . CREST variant of scleroderma ([place])   . Cystocele   . Diverticulitis   . GERD (gastroesophageal reflux disease)   . History of blood transfusion    hx of being Anti-K positive  . History of Doppler ultrasound    a. Carotid US 2/12: bilateral ICA < 50%  . Hyperlipidemia   . Hypertension   . Ischemic  cardiomyopathy    a. EF 35-40% by LHC at time of MI;  b. Echo 7/14:  Mild focal basal septal hypertrophy, EF 50-55%, apical HK, grade 1 diastolic dysfunction, trivial AI   . Ischemic colitis ([place])   . Osteoporosis 12/2017   T score -2.6  . Raynaud disease   . Rectocele    mild    Patient Active Problem List   Diagnosis Date Noted  . Psychophysiological insomnia 09/24/2017  . Abnormal nuclear cardiac imaging test 05/30/2017  . Abnormal stress echo   . Solar lentigo 11/08/2016  . Acute blood loss anemia 10/10/2015  . Colitis 10/08/2015  . CAD (coronary artery disease), native coronary artery 06/27/2013  . Hyperlipidemia 06/27/2013  . Precordial pain 06/26/2013  . History of ST elevation myocardial infarction (STEMI) 06/24/2013  . Hypertension   . GERD (gastroesophageal reflux disease)   . Barrett's esophagus   . Cystocele   . Rectocele     Past Surgical History:  Procedure Laterality Date  . ABDOMINAL HYSTERECTOMY  1996   RSO for menorrhagia.  . CORONARY ANGIOPLASTY WITH STENT PLACEMENT  06/21/2013  . CORONARY STENT INTERVENTION N/A 05/30/2017   Procedure: Coronary Stent Intervention;  Surgeon: [name], MD;  Location: [facility];  Service: Cardiovascular;  Laterality: N/A;  . FOOT SURGERY  2013   x2  . INTRAVASCULAR PRESSURE WIRE/FFR STUDY N/A 05/30/2017   Procedure: Intravascular Pressure Wire/FFR Study;  Surgeon: [name], MD;  Location: [facility];  Service: Cardiovascular;  Laterality: N/A;  . JOINT REPLACEMENT  9/10   rt total  . LEFT HEART CATH AND CORONARY ANGIOGRAPHY N/A 05/30/2017   Procedure: Left Heart Cath and Coronary Angiography;  Surgeon: [name], MD;  Location: [facility];  Service: Cardiovascular;  Laterality: N/A;  . nissen fundoplication  9562  . ROTATOR CUFF REPAIR    . TONSILLECTOMY  1950  . TOTAL KNEE ARTHROPLASTY     right  . TUBAL LIGATION  1980    OB History    Gravida  1   Para  1   Term       Preterm      AB      Living  1     SAB      TAB      Ectopic      Multiple      Live Births               Home Medications    Prior to Admission medications   Medication Sig Start Date End Date Taking? Authorizing Provider  acetaminophen (TYLENOL) 500 MG tablet Take 1,000 mg by mouth every 6 (six) hours as needed for headache.    [provider]  ALPRAZolam Duanne Moron) 0.25 MG tablet Take 1 tablet (0.25 mg total) by mouth daily as needed for anxiety. 09/17/17   Wardell Honour, MD  aspirin EC 81 MG EC tablet Take 1 tablet (81 mg total) by mouth daily. 06/24/13   Barrett, Evelene Croon, PA-C  ezetimibe (ZETIA) 10 MG tablet TAKE 1/2 TABLET(5 MG) BY MOUTH DAILY 11/27/18   [name], MD  losartan (COZAAR) 50 MG tablet Take 0.5 tablets (25 mg total) by mouth daily. 11/27/18   [name], MD  metoprolol succinate (TOPROL-XL) 25 MG 24 hr tablet TAKE 1/2 TABLET(12.5 MG) BY MOUTH DAILY 01/08/19   [name], MD  nitroGLYCERIN (NITROSTAT) 0.4 MG SL tablet Place 1 tablet (0.4 mg total) under the tongue every 5 (five) minutes x 3 doses as needed for chest pain. 01/04/19   [name], MD  pravastatin (PRAVACHOL) 10 MG tablet Take 1 tablet (10 mg total) by mouth every evening. 11/27/18   [name], MD  traMADol (ULTRAM) 50 MG tablet Take 1 tablet (50 mg total) by mouth every 8 (eight) hours as needed for moderate pain or severe pain. 01/29/19   Noe Gens, PA-C    Family History Family History  Problem Relation Age of Onset  . Cancer Mother        Pancreatic Cancer  . Hepatitis C Mother   . Heart disease Father        congestive heart failure  . Heart attack Father   . Panic disorder Daughter   . Hypertension Daughter   . Asthma Maternal Grandmother   . Emphysema Maternal Grandmother   . Cancer Maternal Grandfather        lung  . Stroke Neg Hx     Social History Social History   Tobacco Use  . Smoking status: Never Smoker  . Smokeless tobacco: Never  Used  Substance Use Topics  . Alcohol use: No    Alcohol/week: 0.0 standard drinks    Frequency:  Never  . Drug use: No     Allergies   Aspirin; Pravastatin; Flagyl [metronidazole]; Nsaids; Pravastatin sodium; and Statins   Review of Systems Review of Systems  Musculoskeletal: Positive for arthralgias, myalgias, neck pain and neck stiffness.  Skin: Negative for color change and rash.  Neurological: Negative for weakness and numbness.     Physical Exam Triage Vital Signs ED Triage Vitals  Enc Vitals Group     BP 01/29/19 1247 132/72     Pulse Rate 01/29/19 1247 94     Resp --      Temp --      Temp src --      SpO2 01/29/19 1247 97 %     Weight 01/29/19 1248 126 lb (57.2 kg)     Height 01/29/19 1248 5\' 1"  (1.549 m)     Head Circumference --      Peak Flow --      Pain Score 01/29/19 1248 8     Pain Loc --      Pain Edu? --      Excl. in [place]? --    No data found.  Updated Vital Signs BP 132/72 (BP Location: Right Arm)   Pulse 94   Ht 5\' 1"  (1.549 m)   Wt 126 lb (57.2 kg)   LMP 11/25/1994   SpO2 97%   BMI 23.81 kg/m   Visual Acuity Right Eye Distance:   Left Eye Distance:   Bilateral Distance:    Right Eye Near:   Left Eye Near:    Bilateral Near:     Physical Exam Vitals signs and nursing note reviewed.  Constitutional:      Appearance: She is well-developed.  HENT:     Head: Normocephalic and atraumatic.  Neck:     Musculoskeletal: Normal range of motion and neck supple. Muscular tenderness present.     Comments: No spinal tenderness. Tenderness at base of skull and bilateral proximal cervical muscles. Full ROM Cardiovascular:     Rate and Rhythm: Normal rate.  Pulmonary:     Effort: Pulmonary effort is normal.  Musculoskeletal: Normal range of motion.        General: No tenderness.  Skin:    General: Skin is warm and dry.  Neurological:     Mental Status: She is alert and oriented to person, place, and time.  Psychiatric:        Behavior:  Behavior normal.      UC Treatments / Results  Labs (all labs ordered are listed, but only abnormal results are displayed) Labs Reviewed - No data to display  EKG None  Radiology No results found.  Procedures Procedures (including critical care time)  Medications Ordered in UC Medications - No data to display  Initial Impression / Assessment and Plan / UC Course  I have reviewed the triage vital signs and the nursing notes.  Pertinent labs & imaging results that were available during my care of the patient were reviewed by me and considered in my medical decision making (see chart for details).     Hx and exam c/w neck muscle spasm Pt has a high risk of GI bleed, she cannot take NSAIDs She has done well with tramadol in the past. Encouraged f/u with PCP and orthopedist.   Final Clinical Impressions(s) / UC Diagnoses   Final diagnoses:  Neck muscle spasm  Neck pain, bilateral posterior     Discharge Instructions      Tramadol is strong pain  medication. While taking, do not drink alcohol, drive, or perform any other activities that requires focus while taking these medications.   Please schedule a follow up appointment with your orthopedist for further evaluation and treatment of your symptoms.    ED Prescriptions    Medication Sig Dispense Auth. Provider   traMADol (ULTRAM) 50 MG tablet Take 1 tablet (50 mg total) by mouth every 8 (eight) hours as needed for moderate pain or severe pain. 8 tablet Noe Gens, PA-C     Controlled Substance Prescriptions [place] Controlled Substance Registry consulted? Yes, I have consulted the [place] Controlled Substances Registry for this patient, and feel the risk/benefit ratio today is favorable for proceeding with this prescription for a controlled substance.   Noe Gens, [place] 01/29/19 1840

## 2019-02-17 NOTE — Telephone Encounter (Signed)
Patient (pt canc due to COVID will c/b when feels safe to come in)

## 2019-03-16 ENCOUNTER — Telehealth: Payer: Self-pay | Admitting: Cardiovascular Disease

## 2019-03-16 ENCOUNTER — Telehealth: Payer: Self-pay

## 2019-03-16 NOTE — Telephone Encounter (Signed)
Called pt back to schedule tele visit per [facility] advisement.   Appt made with Kathleen Argue VIDEO call at 3:45pm.  [facility] aware of changes to [facility] schedule.   Pt aware to take vitals prior to visit.      Virtual Visit Pre-Appointment Phone Call  "(Name), I am calling you today to discuss your upcoming appointment. We are currently trying to limit exposure to the virus that causes COVID-19 by seeing patients at home rather than in the office."  1. "What is the BEST phone number to call the day of the visit?" - include this in appointment notes  2. Do you have or have access to (through a family member/friend) a smartphone with video capability that we can use for your visit?" a. If yes - list this number in appt notes as cell (if different from BEST phone #) and list the appointment type as a VIDEO visit in appointment notes b. If no - list the appointment type as a PHONE visit in appointment notes  3. Confirm consent - "In the setting of the current Covid19 crisis, you are scheduled for a VIDEO visit with your provider on 4/22 at 3:45p.  Just as we do with many in-office visits, in order for you to participate in this visit, we must obtain consent.  If you'd like, I can send this to your mychart (if signed up) or email for you to review.  Otherwise, I can obtain your verbal consent now.  All virtual visits are billed to your insurance company just like a normal visit would be.  By agreeing to a virtual visit, we'd like you to understand that the technology does not allow for your provider to perform an examination, and thus may limit your provider's ability to fully assess your condition. If your provider identifies any concerns that need to be evaluated in person, we will make arrangements to do so.  Finally, though the technology is pretty good, we cannot assure that it will always work on either your or our end, and in the setting of a video visit, we may have to convert it to a  phone-only visit.  In either situation, we cannot ensure that we have a secure connection.  Are you willing to proceed?" STAFF: Did the patient verbally acknowledge consent to telehealth visit? Document YES/NO here: YES  4. Advise patient to be prepared - "Two hours prior to your appointment, go ahead and check your blood pressure, pulse, oxygen saturation, and your weight (if you have the equipment to check those) and write them all down. When your visit starts, your provider will ask you for this information. If you have an Apple Watch or Kardia device, please plan to have heart rate information ready on the day of your appointment. Please have a pen and paper handy nearby the day of the visit as well."  5. Give patient instructions for MyChart download to smartphone OR [email or web address] as below if video visit (depending on what platform provider is using)  6. Inform patient they will receive a phone call 15 minutes prior to their appointment time (may be from unknown caller ID) so they should be prepared to answer    TELEPHONE CALL NOTE  Kaari Zeigler has been deemed a candidate for a follow-up tele-health visit to limit community exposure during the Covid-19 pandemic. I spoke with the patient via phone to ensure availability of phone/video source, confirm preferred email & phone number, and discuss instructions and expectations.  I reminded Suleima Ohlendorf to be prepared with any vital sign and/or heart rhythm information that could potentially be obtained via home monitoring, at the time of her visit. I reminded Vilma Will to expect a phone call prior to her visit.  Wilma Flavin, RN 03/16/2019 4:59 PM   .

## 2019-03-16 NOTE — Telephone Encounter (Signed)
Called pt back regarding dizzy spell  Pt states this has been going on for past three days, light headed when she wakes up & dizzy, not feeling like herself  However, this morning she did not feel light headed so she went for her usual 1 mile walk and ended up passing out in her yard after her walk. She then called her grandson who came to help her back inside.  Pt took a Nitroglycerin tab approximately one week ago d/t arm pain. Pt denies any SOB or chest pain at this time.  Pt states this is her 41st day of self quarantine.   Pts BP this morning was 117/70 before her walk. But has not been able to check it since.   No diet or medication changes. No other changes in ADL's.   Pt states she does not have any refills on her Xanax and has been feeling very stressed lately. She would like a refill if possible but is most concerned that she is taking too much BP medication which is causing her to feel this way. Confirmed she is taking 12.5 mg Metoprolol daily.

## 2019-03-16 NOTE — Telephone Encounter (Signed)
Please schedule telehealth visit with an APP on Dr. Antionette Char team.

## 2019-03-16 NOTE — Telephone Encounter (Signed)
STAT if patient feels like he/she is going to faint   1) Are you dizzy now? No, just weak  2) Do you feel faint or have you passed out? Passed out  3) Do you have any other symptoms? Weak, very tired when she was walking  4) Have you checked your HR and BP (record if available)? No. This morning it was 117/70. Patient states for last three days she has woken up feeling lightheaded, and off balance,  but as the day went on she has felt better.

## 2019-03-17 ENCOUNTER — Other Ambulatory Visit: Payer: Self-pay

## 2019-03-17 ENCOUNTER — Telehealth (INDEPENDENT_AMBULATORY_CARE_PROVIDER_SITE_OTHER): Payer: Medicare PPO | Admitting: Physician Assistant

## 2019-03-17 ENCOUNTER — Ambulatory Visit: Payer: Medicare PPO

## 2019-03-17 ENCOUNTER — Encounter: Payer: Self-pay | Admitting: Physician Assistant

## 2019-03-17 ENCOUNTER — Telehealth: Payer: Self-pay | Admitting: *Deleted

## 2019-03-17 ENCOUNTER — Telehealth: Payer: Self-pay | Admitting: Physician Assistant

## 2019-03-17 VITALS — BP 117/64 | HR 94 | Ht 61.0 in | Wt 124.4 lb

## 2019-03-17 DIAGNOSIS — I10 Essential (primary) hypertension: Secondary | ICD-10-CM

## 2019-03-17 DIAGNOSIS — E785 Hyperlipidemia, unspecified: Secondary | ICD-10-CM

## 2019-03-17 DIAGNOSIS — R55 Syncope and collapse: Secondary | ICD-10-CM

## 2019-03-17 DIAGNOSIS — I2510 Atherosclerotic heart disease of native coronary artery without angina pectoris: Secondary | ICD-10-CM | POA: Diagnosis not present

## 2019-03-17 DIAGNOSIS — Z7189 Other specified counseling: Secondary | ICD-10-CM

## 2019-03-17 NOTE — Patient Instructions (Signed)
Medication Instructions:  Stop taking Losartan for now  If you need a refill on your cardiac medications before your next appointment, please call your pharmacy.   Lab work: Arrange home visit for:  BMET, CBC  If you have labs (blood work) drawn today and your tests are completely normal, you will receive your results only by: Marland Kitchen MyChart Message (if you have MyChart) OR . A paper copy in the mail If you have any lab test that is abnormal or we need to change your treatment, we will call you to review the results.  Testing/Procedures: 30 day event monitor   Follow-Up: At [facility], you and your health needs are our priority.  As part of our continuing mission to provide you with exceptional heart care, we have created designated Provider Care Teams.  These Care Teams include your primary Cardiologist (physician) and Advanced Practice Providers (APPs -  Physician Assistants and Nurse Practitioners) who all work together to provide you with the care you need, when you need it. You will need a follow up appointment in:  6 weeks.  Please call our office 2 months in advance to schedule this appointment.  You may see [name], MD or Richardson Dopp, PA-C   Any Other Special Instructions Will Be Listed Below (If Applicable). 1. We will arrange a home visit to get labs (see above), blood pressure check, orthostatic vital signs (BP laying down, sitting up and standing up), an exam. 2. Check your BP daily (1-2 times a day).  Call if it is 140/90 or higher.

## 2019-03-17 NOTE — Telephone Encounter (Signed)
[place] Visit Request  Agency Requested:  [facility] Contact:  Darnell Level, NP Phone #:  [phone] Fax #:  [phone]  Patient Information: Name:  Kristine [name]  DOB:  07-24-1941  MRN:  [id number]  Address:   [address] [place] [address]  Phone Numbers:   Home Phone [phone]  Mobile [phone]     Requesting Provider:   Richardson Dopp, PA-C   Diagnosis:   Syncope   Reason for Visit:   Labs; Check ortho VS   Services Requested:  Vital Signs (BP, Pulse, O2, Weight)  Orthostatic Vital Signs  Physical Exam  Labs:  BMET, CBC  # of Visits Needed/Frequency per Week: 1 visit

## 2019-03-17 NOTE — Progress Notes (Signed)
Virtual Visit via Video Note   This visit type was conducted due to national recommendations for restrictions regarding the COVID-19 Pandemic (e.g. social distancing) in an effort to limit this patient's exposure and mitigate transmission in our community.  Due to her co-morbid illnesses, this patient is at least at moderate risk for complications without adequate follow up.  This format is felt to be most appropriate for this patient at this time.  (Of note, half of the encounter was completed by video.  However, the connection was poor and the remainder of the visit was performed by telephone.)  All issues noted in this document were discussed and addressed.  A limited physical exam was performed with this format.  Please refer to the patient's chart for her consent to telehealth for [facility].   Evaluation Performed:  Follow-up visit  Date:  03/17/2019   ID:  Kristine [name], DOB [birth year], MRN [id number]  Patient Location: Home Provider Location: Home  PCP:  System, Provider Not In  Cardiologist:  [name], MD   Electrophysiologist:  None   Chief Complaint:  Syncope  History of Present Illness:    Kristine [name] is a 78 y.o. female with coronary artery disease status post lateral ST elevation myocardial infarction in July 2014 treated with a DES to the OM1, hypertension, hyperlipidemia, CREST syndrome, bronchiectasis.  Initial EF was depressed at 35-40% but improved to normal.  She developed recurrent angina in July 2018 and was noted to have severe stenosis involving the proximal edge of the previously placed stent in the LCx.  This lesion was treated with DES.  She was last seen by Dr. Burt Knack in June 2019.  She was added onto my schedule today for the evaluation of syncope.  Over the past several weeks, she has experienced lightheadedness, especially when getting out of bed in the mornings.  She did feel better yesterday.  She went for a walk in the afternoon, after lunch.  Of  note, she was wearing a heavy coat and was wearing her mask.  She stopped several times during her walk because she was tired.  When she got home, she felt lightheaded and near syncopal.  She tried to walk over to the grass so that if she fell, she would not hurt herself.  She did proceed to pass out and woke up on the grass.  She did not injure herself.  Her grandson helped her into the house.  This feeling of lightheadedness has come and gone over the past several weeks.  She has not had chest pain but she did have an episode of left axillary pain that resolved with nitroglycerin.  This occurred about a week ago.  She has noted some decreased energy but she does not feel fatigued like she did prior to her PCI in 2018.  The patient does not have symptoms concerning for COVID-19 infection (fever, chills, cough, or new shortness of breath).    Past Medical History:  Diagnosis Date   Anemia    Arthritis    Barrett's esophagus    Stage B   CAD (coronary artery disease)    a. s/p STEMI 7/14 >> LHC: LM 20, mLAD 30-40, small Dx 100 (L-L collats), OM1 90, OM2 30-40, mRCA 50-60, Lat/AL akinesis, EF 35-40% >> PCI: Promus DES to OM1;  b. Myoview 3/16: diaph atten, no ischemia, EF 59% (intermediate risk b/c BP drop 152>>137, but recovery BP 162 (tech error) >> med Rx continued  c. cath 05/30/17 s/p  FFR guided PCI with DES to circumflex/first OM    CREST variant of scleroderma (HCC)    Cystocele    Diverticulitis    GERD (gastroesophageal reflux disease)    History of blood transfusion    hx of being Anti-K positive   History of Doppler ultrasound    a. Carotid US 2/12: bilateral ICA < 50%   Hyperlipidemia    Hypertension    Ischemic cardiomyopathy    a. EF 35-40% by LHC at time of MI;  b. Echo 7/14:  Mild focal basal septal hypertrophy, EF 50-55%, apical HK, grade 1 diastolic dysfunction, trivial AI    Ischemic colitis ([place])    Osteoporosis 12/2017   T score -2.6   Raynaud disease     Rectocele    mild   Past Surgical History:  Procedure Laterality Date   ABDOMINAL HYSTERECTOMY  1996   RSO for menorrhagia.   CORONARY ANGIOPLASTY WITH STENT PLACEMENT  06/21/2013   CORONARY STENT INTERVENTION N/A 05/30/2017   Procedure: Coronary Stent Intervention;  Surgeon: [name], MD;  Location: [place] [facility];  Service: Cardiovascular;  Laterality: N/A;   FOOT SURGERY  2013   x2   INTRAVASCULAR PRESSURE WIRE/FFR STUDY N/A 05/30/2017   Procedure: Intravascular Pressure Wire/FFR Study;  Surgeon: [name], MD;  Location: [place] [facility];  Service: Cardiovascular;  Laterality: N/A;   JOINT REPLACEMENT  9/10   rt total   LEFT HEART CATH AND CORONARY ANGIOGRAPHY N/A 05/30/2017   Procedure: Left Heart Cath and Coronary Angiography;  Surgeon: [name], MD;  Location: [place] [facility];  Service: Cardiovascular;  Laterality: N/A;   nissen fundoplication  9373   ROTATOR CUFF REPAIR     TONSILLECTOMY  1950   TOTAL KNEE ARTHROPLASTY     right   TUBAL LIGATION  1980     Current Meds  Medication Sig   acetaminophen (TYLENOL) 500 MG tablet Take 1,000 mg by mouth every 6 (six) hours as needed for headache.   ALPRAZolam (XANAX) 0.25 MG tablet Take 1 tablet (0.25 mg total) by mouth daily as needed for anxiety.   aspirin EC 81 MG EC tablet Take 1 tablet (81 mg total) by mouth daily.   ezetimibe (ZETIA) 10 MG tablet TAKE 1/2 TABLET(5 MG) BY MOUTH DAILY   famotidine (PEPCID) 40 MG tablet Take 1 tablet by mouth at bedtime.   losartan (COZAAR) 50 MG tablet Take 0.5 tablets (25 mg total) by mouth daily.   metoprolol succinate (TOPROL-XL) 25 MG 24 hr tablet TAKE 1/2 TABLET(12.5 MG) BY MOUTH DAILY   nitroGLYCERIN (NITROSTAT) 0.4 MG SL tablet Place 1 tablet (0.4 mg total) under the tongue every 5 (five) minutes x 3 doses as needed for chest pain.   pantoprazole (PROTONIX) 40 MG tablet Take 40 mg by mouth daily.   pravastatin (PRAVACHOL) 10 MG tablet Take 10  mg by mouth daily.   traMADol (ULTRAM) 50 MG tablet Take 1 tablet (50 mg total) by mouth every 8 (eight) hours as needed for moderate pain or severe pain.     Allergies:   Aspirin; Pravastatin; Flagyl [metronidazole]; Nsaids; Pravastatin sodium; and Statins   Social History   Tobacco Use   Smoking status: Never Smoker   Smokeless tobacco: Never Used  Substance Use Topics   Alcohol use: No    Alcohol/week: 0.0 standard drinks    Frequency: Never   Drug use: No     Family Hx: The patient's family history includes Asthma  in her maternal grandmother; Cancer in her maternal grandfather and mother; Emphysema in her maternal grandmother; Heart attack in her father; Heart disease in her father; Hepatitis C in her mother; Hypertension in her daughter; Panic disorder in her daughter. There is no history of Stroke.  ROS:   Please see the history of present illness.    No melena, hematochezia, hematuria, fevers, vomiting or diarrhea.   All other systems reviewed and are negative.   Prior CV studies:   The following studies were reviewed today:  Cardiac catheterization 05/30/2017 LAD proximal 30 LCx proximal 70 at proximal edge of stent; OM1 stent patent RCA mid 40 PCI: 3.5 x 16 mm Promus Premier DES to the proximal LCx  Stress echocardiogram 05/20/2017 - Abnormal stress echo.   She has normal LV function at rest.   The stress apical 2 images is slightly rotated but shows   hypokinesis in the apical inferior ( inferior septal ) region   Suggest cardiac cath  Myoview 02/01/15 Intermediate risk, inferolateral and anterolateral defect most likely secondary to diaphragmatic attenuation, no ischemia.  BP did drop 152/49 >> 137/27mmHg but BP in recovery was 162/55 >> may be tech error; EF 59%.     Echocardiogram 05/2013 Mild focal basal septal hypertrophy, EF 50-55%, apical HK, grade 1 diastolic dysfunction, trivial AI   Cardiac Cath/PCI 05/2013 LM:  20% LAD:   Mid-vessel with 30-40%  stenosis. Small diagonal occluded fills from left-left collaterals with late filling. LCx:   1st OM with 90% stenosis, 2nd Om with mild diffuse 30-40% stenosis   RCA:   Mid-vessel with 50-60% stenosis.   Large lateral wall/anterolateral AK, EF 35-40%. PCI Data:  Proximal OM Stent 2.75x16 mm Promus   Carotid US 12/2010 Bilat ICA < 50%   Labs/Other Tests and Data Reviewed:    EKG:  No ECG reviewed.  Recent Labs: No results found for requested labs within last 8760 hours.   Recent Lipid Panel Lab Results  Component Value Date/Time   CHOL 148 04/28/2017 10:03 AM   CHOL 153 07/29/2014 09:26 AM   TRIG 126 04/28/2017 10:03 AM   TRIG 112 07/29/2014 09:26 AM   HDL 45 04/28/2017 10:03 AM   HDL 49 07/29/2014 09:26 AM   CHOLHDL 3.3 04/28/2017 10:03 AM   CHOLHDL 2.6 11/23/2015 09:12 AM   LDLCALC 78 04/28/2017 10:03 AM   LDLCALC 82 07/29/2014 09:26 AM    Wt Readings from Last 3 Encounters:  03/17/19 124 lb 6.4 oz (56.4 kg)  01/29/19 126 lb (57.2 kg)  01/12/19 128 lb (58.1 kg)     Objective:    Vital Signs:  BP 117/64    Pulse 94    Ht 5\' 1"  (1.549 m)    Wt 124 lb 6.4 oz (56.4 kg)    LMP 11/25/1994    BMI 23.51 kg/m    VITAL SIGNS:  reviewed GEN:  no acute distress RESPIRATORY:  normal respiratory effort, symmetric expansion NEURO:  alert and oriented x 3, no obvious focal deficit PSYCH:  normal affect  ASSESSMENT & PLAN:    Syncope, unspecified syncope type  Etiology not entirely clear.  I question whether or not she had a vasovagal episode.  She was out walking with a heavy coat and wearing her mask.  However, she does note that her heart rate is typically in the 60s but has been in the 90s recently.  Question if she may have atrial fibrillation and uncontrolled rate contributing to her symptoms.  Her  blood pressure has been running lower than typical.  However, her blood pressure is not significantly depressed.  In any event I will adjust her blood pressure medications to see  if this helps.  I will arrange a home visit to obtain a BMET, CBC, blood pressure and orthostatic vital signs as well as a brief exam.  I will also arrange a 30-day event monitor.  I have advised her to do no driving.  I will arrange a follow-up visit with either Dr. Burt Knack or me in 4 to 6 weeks.  She knows to contact us if she has worsening symptoms.  Coronary artery disease involving native coronary artery of native heart without angina pectoris History of lateral MI in 2014 treated with DES to the OM1.  She underwent DES to the LCx in 2018 in the setting of worsening angina.  She has not had chest discomfort.  She did have some axillary pain that was relieved by nitroglycerin.  She has not really had symptoms reminiscent of her previous angina.  Continue aspirin, beta-blocker, pravastatin.  Essential hypertension Her blood pressure has been running lower than normal.  I will stop her losartan for now.  She knows to contact us if her blood pressure runs 140/90 or higher.  Hyperlipidemia, unspecified hyperlipidemia type Continue statin therapy, ezetimibe.  COVID-19 Education: The signs and symptoms of COVID-19 were discussed with the patient and how to seek care for testing (follow up with PCP or arrange E-visit).  The importance of social distancing was discussed today.  Time:   Today, I have spent 22 minutes with the patient with telehealth technology discussing the above problems.     Medication Adjustments/Labs and Tests Ordered: Current medicines are reviewed at length with the patient today.  Concerns regarding medicines are outlined above.   Tests Ordered: Orders Placed This Encounter  Procedures   Cardiac event monitor    Medication Changes: No orders of the defined types were placed in this encounter.   Disposition:  Follow up in 6 week(s)  Signed, Richardson Dopp, PA-C  03/17/2019 5:26 PM    [facility]

## 2019-03-17 NOTE — Telephone Encounter (Signed)
LMOVM OF HOME HEALTH VISIT TO CONTACT CLINIC BACK  CONFIRMING FAX WAS RECEIVED FOR REQUEST  PT ALSO CONTACTED PT TO GO OVER FOLLOW UP APPT SCHEDULED  SCHEDULED 05-03-19  PT AWARE OF PHONE CALLS FOR EVENT MONITOR AND HOME VISIT SET UP  FOR LABS   PT AWARE TO HOLD LOSARTAN AND MONITOR BP

## 2019-03-18 ENCOUNTER — Telehealth: Payer: Self-pay | Admitting: Radiology

## 2019-03-18 NOTE — Telephone Encounter (Signed)
Enrolled patient for a 30 day [employer] monitor to be mailed due to Covid-19. Went over improtant instructions with her and she knows to expect the monitor to arrive in 3-4 days

## 2019-03-18 NOTE — Telephone Encounter (Addendum)
° °  Fax has been received, confirmed with Dr Donovan Kail. Any questions call her cell # [phone]

## 2019-03-23 ENCOUNTER — Other Ambulatory Visit: Payer: Self-pay | Admitting: Physician Assistant

## 2019-03-23 ENCOUNTER — Ambulatory Visit (INDEPENDENT_AMBULATORY_CARE_PROVIDER_SITE_OTHER): Payer: Medicare PPO

## 2019-03-23 ENCOUNTER — Telehealth: Payer: Self-pay | Admitting: Physician Assistant

## 2019-03-23 DIAGNOSIS — R55 Syncope and collapse: Secondary | ICD-10-CM

## 2019-03-23 DIAGNOSIS — I2510 Atherosclerotic heart disease of native coronary artery without angina pectoris: Secondary | ICD-10-CM

## 2019-03-23 NOTE — Telephone Encounter (Signed)
[facility] Visit Request  Agency Requested:  [facility] Contact:  Darnell Level, NP Phone #:  [phone] Fax #:  [phone]  Patient Information: Name:  Kristine [name]  DOB:  08/10/1941  MRN:  [id number]  Address:   [address] [place] [address]  Phone Numbers:   Home Phone [phone]  Mobile [phone]    Designated Party Release: [facility] [place] [facility]) [phone] AND Cletus Gash ([facility]) [phone]. LEAVE MSG ON HOME ANS MACHINE/CELL PHONE VM [phone].  Requesting Provider:   Richardson Dopp, PA-C   Diagnosis:   Syncope  Reason for Visit:   Syncope  Services Requested:  Vital Signs (BP, Pulse, O2, Weight)  Orthostatic Vital Signs  Physical Exam  Labs:  BMET, CBC  # of Visits Needed/Frequency per Week: 1

## 2019-03-24 ENCOUNTER — Telehealth: Payer: Self-pay | Admitting: *Deleted

## 2019-03-24 LAB — BASIC METABOLIC PANEL
BUN/Creatinine Ratio: 21 (ref 10–24)
BUN: 15 mg/dL (ref 8–27)
CO2: 21 mmol/L (ref 20–29)
Calcium: 9.7 mg/dL (ref 8.6–10.2)
Chloride: 102 mmol/L (ref 96–106)
Creatinine, Ser: 0.72 mg/dL — ABNORMAL LOW (ref 0.76–1.27)
GFR calc Af Amer: 105 mL/min/{1.73_m2} (ref >59)
GFR calc non Af Amer: 91 mL/min/{1.73_m2} (ref >59)
Glucose: 99 mg/dL (ref 65–99)
Potassium: 4.8 mmol/L (ref 3.5–5.2)
Sodium: 142 mmol/L (ref 134–144)

## 2019-03-24 LAB — CBC WITH DIFFERENTIAL/PLATELET
Basophils Absolute: 0.1 10*3/uL (ref 0.0–0.2)
Basos: 1 %
EOS (ABSOLUTE): 0.2 10*3/uL (ref 0.0–0.4)
Eos: 2 %
Hematocrit: 28.8 % — ABNORMAL LOW (ref 37.5–51.0)
Hemoglobin: 8 g/dL — ABNORMAL LOW (ref 13.0–17.7)
Immature Grans (Abs): 0 10*3/uL (ref 0.0–0.1)
Immature Granulocytes: 0 %
Lymphocytes Absolute: 2.1 10*3/uL (ref 0.7–3.1)
Lymphs: 24 %
MCH: 19.1 pg — ABNORMAL LOW (ref 26.6–33.0)
MCHC: 27.8 g/dL — ABNORMAL LOW (ref 31.5–35.7)
MCV: 69 fL — ABNORMAL LOW (ref 79–97)
Monocytes Absolute: 1.1 10*3/uL — ABNORMAL HIGH (ref 0.1–0.9)
Monocytes: 13 %
Neutrophils Absolute: 5.4 10*3/uL (ref 1.4–7.0)
Neutrophils: 60 %
Platelets: 192 10*3/uL (ref 150–450)
RBC: 4.18 x10E6/uL (ref 4.14–5.80)
RDW: 17.9 % — ABNORMAL HIGH (ref 11.6–15.4)
WBC: 8.9 10*3/uL (ref 3.4–10.8)

## 2019-03-24 NOTE — Telephone Encounter (Addendum)
   Incoming Triage Call Report from [place] Visit  Patient Name: Kristine [name] MRN: [id number] DOB: 02-20-1941 Date of Home Visit: 03/24/2019 Requesting Provider: [place] Provider Name Giving Report: Glory Buff, NP  Patient's Vital Signs:  Temp - 98.2 HR - 81 Resp - 18 Height - 5'1" Weight - 128 pds O2 - 91  (started out as 60, then 74, then 76, then 91.  After warming hands secondary to Raynaud's)  Orthostatic vitals: Sitting 123/71 Standing 118/92 Supine 132/70      Pt reporting dizziness with each measurement  Labs Drawn Today: CBC, BMET  Clinical Assessment/Concerns:  Pt c/o dizziness, anxiety, and a lot of bloating RLE - 21 cm LLE - 20.5 cm Abd - 89 cm Cyanotic hands/feet d/t Raynaud's  Pt was not wearing her event monitor, NP placed on pt Pt is not taking Losartan or Tramadol (listed on her med list)   Follow-up Recommendations:  HHNP will establish pt for home based primary care and they will follow.  Pulse ox left at home for pt to use to monitor. States pt missed her osteoporosis shot and this may have contributed to fall a few weeks ago   NOTE: This call should be routed to referring cardiology provider. If provider is out of the office and there is an urgent question, the pre-op APP should be contacted for further recommendations.

## 2019-03-24 NOTE — Telephone Encounter (Signed)
Follow up call  Offered pt drive by option she declined due to driving restrictions as well as no one to bring here. Pt states she will call back to schedule visit.

## 2019-03-24 NOTE — Telephone Encounter (Signed)
Reviewed notes.  BP drops some with going from lying to standing but no significant drop. Labs:  Hgb 8, K 4.8, Creatinine 0.72  I suspect her anemia has contributed to her recent symptoms.  PLAN:   She needs follow up with primary care and her gastroenterologist as soon as possible to evaluate her anemia.  Please fax labs to PCP and GI.  Please request follow up appointments with both providers for the patient to evaluate anemia.  Finish event monitor as planned.   Richardson Dopp, PA-C    03/24/2019 5:33 PM

## 2019-03-25 ENCOUNTER — Telehealth: Payer: Self-pay | Admitting: Nurse Practitioner

## 2019-03-25 NOTE — Telephone Encounter (Signed)
-----   Message from Liliane Shi, [place] sent at 03/25/2019  3:23 PM EDT ----- See telephone encounter from 4.29.2020 Reviewed notes from Home Visit.  BP drops some with going from lying to standing but no significant drop. Labs:  Hgb 8, K 4.8, Creatinine 0.72 I suspect her anemia has contributed to her recent symptoms. PLAN:   She needs follow up with primary care and her gastroenterologist as soon as possible to evaluate her anemia.  Please fax labs to PCP and GI.  Please request follow up appointments with both her PCP and GI to evaluate anemia.  Finish event monitor as planned.  Richardson Dopp, PA-C    03/25/2019 3:22 PM

## 2019-03-25 NOTE — Telephone Encounter (Signed)
Reviewed results with patient. She states she does not have PCP. Results forwarded to her GI doctor, Dr. Sunny Schlein. I asked her to follow up with Dr. Sunny Schlein to discuss results tomorrow. Patient was very grateful for the call and for all that Richardson Dopp has done. I advised her to call back with questions or concerns prior to follow-up. She verbalized understanding and agreement with plan and thanked me for the call.

## 2019-03-26 ENCOUNTER — Telehealth: Payer: Self-pay | Admitting: *Deleted

## 2019-03-26 NOTE — Telephone Encounter (Signed)
SPOKE WITH PT ABOUT ABOUT RESULTS AND CONTACTING PCP WHICH SHE NEEDS ONE.   PT SAY SHE GOES TO THE [facility] OFF [address] IN [place]. PT SAYS AS SOON AS WE HANG UP GOING TO CALL OFFICE TO GET AN APPT    SHE HAD CONTACT HER GASTRO DR. ALREADY  AND LEFT A MESSAGE TO CONTAC HER BACK

## 2019-03-27 ENCOUNTER — Other Ambulatory Visit: Payer: Self-pay | Admitting: Cardiovascular Disease

## 2019-04-01 ENCOUNTER — Ambulatory Visit (INDEPENDENT_AMBULATORY_CARE_PROVIDER_SITE_OTHER): Payer: Medicare PPO | Admitting: *Deleted

## 2019-04-01 ENCOUNTER — Other Ambulatory Visit: Payer: Self-pay

## 2019-04-01 DIAGNOSIS — M810 Age-related osteoporosis without current pathological fracture: Secondary | ICD-10-CM

## 2019-04-01 MED ORDER — DENOSUMAB 60 MG/ML ~~LOC~~ SOSY
60.0000 mg | PREFILLED_SYRINGE | Freq: Once | SUBCUTANEOUS | Status: AC
  Administered 2019-04-01: 60 mg via SUBCUTANEOUS

## 2019-04-01 NOTE — Progress Notes (Signed)
Pt given Prolia while in her car due to her being high risk for COVID.  Both patient and I were wearing masks.  Pt consented previously to a drive up injection.  She has not had any issues with previous Prolia injections.  Proper hand hygiene was completed. KW CMA

## 2019-04-05 ENCOUNTER — Telehealth: Payer: Self-pay | Admitting: Physician Assistant

## 2019-04-05 NOTE — Telephone Encounter (Signed)
New message:    Patient calling concerning have some lab done. Also patient calling concering the home health nurse that comes to see her.

## 2019-04-05 NOTE — Telephone Encounter (Signed)
PT WANT CLINIC TO KNOW HER GASTROENTEROLOGIST IS DRAWING LAB WORK TO CHECK HGB. AND ALSO TH E NAME OF THE AGENCY THE REMOTE HOME HEALTH NURSE WORKS WITH   PT WAS TOLD SHELL BE CONTACTED BACK WITH A NAME ONCE IT IS FOUND OUT

## 2019-04-05 NOTE — Telephone Encounter (Signed)
New Message     Pt said the nurse Nira Conn and Sharyn Lull did the blood work. Her Sherrill Raring Dr wants her to have another blood test.    Please call

## 2019-04-15 NOTE — Telephone Encounter (Signed)
Prolia Given 04/01/2019 Next injection 10/03/2019

## 2019-04-26 DIAGNOSIS — Z9289 Personal history of other medical treatment: Secondary | ICD-10-CM

## 2019-04-26 HISTORY — DX: Personal history of other medical treatment: Z92.89

## 2019-04-27 ENCOUNTER — Other Ambulatory Visit: Payer: Self-pay

## 2019-04-28 ENCOUNTER — Encounter: Payer: Self-pay | Admitting: Physician Assistant

## 2019-05-03 ENCOUNTER — Other Ambulatory Visit: Payer: Self-pay

## 2019-05-03 ENCOUNTER — Telehealth (INDEPENDENT_AMBULATORY_CARE_PROVIDER_SITE_OTHER): Payer: Medicare PPO | Admitting: Physician Assistant

## 2019-05-03 ENCOUNTER — Encounter: Payer: Self-pay | Admitting: Physician Assistant

## 2019-05-03 VITALS — BP 134/73 | HR 67 | Ht 61.0 in | Wt 123.0 lb

## 2019-05-03 DIAGNOSIS — I2510 Atherosclerotic heart disease of native coronary artery without angina pectoris: Secondary | ICD-10-CM | POA: Diagnosis not present

## 2019-05-03 DIAGNOSIS — R55 Syncope and collapse: Secondary | ICD-10-CM

## 2019-05-03 DIAGNOSIS — I10 Essential (primary) hypertension: Secondary | ICD-10-CM

## 2019-05-03 NOTE — Patient Instructions (Addendum)
Medication Instructions:  No changes.  If you need a refill on your cardiac medications before your next appointment, please call your pharmacy.   Lab work: None   If you have labs (blood work) drawn today and your tests are completely normal, you will receive your results only by: Marland Kitchen MyChart Message (if you have MyChart) OR . A paper copy in the mail If you have any lab test that is abnormal or we need to change your treatment, we will call you to review the results.  Testing/Procedures: None   Follow-Up: At [facility], you and your health needs are our priority.  As part of our continuing mission to provide you with exceptional heart care, we have created designated Provider Care Teams.  These Care Teams include your primary Cardiologist (physician) and Advanced Practice Providers (APPs -  Physician Assistants and Nurse Practitioners) who all work together to provide you with the care you need, when you need it. You will need a follow up appointment in:  6 months.  Please call our office 2 months in advance to schedule this appointment.  You may see [name], MD or Richardson Dopp, PA-C  Any Other Special Instructions Will Be Listed Below (If Applicable). Check your blood pressure once a day. If it is greater than 140 on top or 90 on bottom, wait an hour before you recheck it. Make sure you are seated for 15 minutes before you recheck it and you have your feet flat on the floor and your arm at heart level. If you continue to see readings 140/90 or higher, call us.

## 2019-05-03 NOTE — Progress Notes (Signed)
Virtual Visit via Telephone Note   This visit type was conducted due to national recommendations for restrictions regarding the COVID-19 Pandemic (e.g. social distancing) in an effort to limit this patient's exposure and mitigate transmission in our community.  Due to her co-morbid illnesses, this patient is at least at moderate risk for complications without adequate follow up.  This format is felt to be most appropriate for this patient at this time.  The patient did not have access to video technology/had technical difficulties with video requiring transitioning to audio format only (telephone).  All issues noted in this document were discussed and addressed.  No physical exam could be performed with this format.  Please refer to the patient's chart for her  consent to telehealth for [facility].   Date:  05/03/2019   ID:  Kristine [name], DOB [birth year], MRN [id number]  Patient Location: Home Provider Location: Home  PCP:  System, Provider Not In  Cardiologist:  [name], MD   Electrophysiologist:  None   Evaluation Performed:  Follow-Up Visit  Chief Complaint:  Follow up on syncope, HTN, CAD  History of Present Illness:    Kristine [name] is a 78 y.o. female with coronary artery disease status post lateral ST elevation myocardial infarction in July 2014 treated with a DES to the OM1, hypertension, hyperlipidemia, CREST syndrome, bronchiectasis.  Initial EF was depressed at 35-40% but improved to normal.  She developed recurrent angina in July 2018 and was noted to have severe stenosis involving the proximal edge of the previously placed stent in the LCx which was treated with DES.  I saw her in April after a syncopal episode.  She had been out walking in a heavy coat while wearing a facemask.  He symptoms sounded vasovagal, but I had her undergo follow up testing to evaluate.  An event monitor demonstrated no significant arrhythmias.  A CBC demonstrated a Hgb of 8.  I arranged a home  visit and she did have slight BP drop on orthostatic VS.  She was referred back to her gastroenterologist, Dr. Sunny Schlein at [facility].    Today, she notes she is doing well.  She has not had any further dizziness or syncope.  She has not had any shortness of breath, orthopnea or leg swelling.  She has occasional chest pains that are fairly brief.  She had one a few days ago and took nitroglycerin.  She has not had any exertional symptoms.  Blood pressures have been somewhat elevated at times.  However, she can check her blood pressure later and it is at goal.  As noted, she has followed up with gastroenterology.  Her iron therapy has been increased and she should have follow-up blood work in the future.  The patient does not have symptoms concerning for COVID-19 infection (fever, chills, cough, or new shortness of breath).    Past Medical History:  Diagnosis Date   Anemia    Arthritis    Barrett's esophagus    Stage B   CAD (coronary artery disease)    a. s/p STEMI 7/14 >> LHC: LM 20, mLAD 30-40, small Dx 100 (L-L collats), OM1 90, OM2 30-40, mRCA 50-60, Lat/AL akinesis, EF 35-40% >> PCI: Promus DES to OM1;  b. Myoview 3/16: diaph atten, no ischemia, EF 59% (intermediate risk b/c BP drop 152>>137, but recovery BP 162 (tech error) >> med Rx continued  c. cath 05/30/17 s/p FFR guided PCI with DES to circumflex/first OM    CREST variant of scleroderma (  [place])    Cystocele    Diverticulitis    Event monitor 04/2019   Event monitor 04/2019: Sinus rhythm; no atrial fibrillation/flutter; no bradycardic events; few PVCs and occasional ventricular runs up to 9 beats   GERD (gastroesophageal reflux disease)    History of blood transfusion    hx of being Anti-K positive   History of Doppler ultrasound    a. Carotid US 2/12: bilateral ICA < 50%   Hyperlipidemia    Hypertension    Ischemic cardiomyopathy    a. EF 35-40% by LHC at time of MI;  b. Echo 7/14:  Mild focal basal septal hypertrophy, EF  50-55%, apical HK, grade 1 diastolic dysfunction, trivial AI    Ischemic colitis ([place])    Osteoporosis 12/2017   T score -2.6   Raynaud disease    Rectocele    mild   Past Surgical History:  Procedure Laterality Date   ABDOMINAL HYSTERECTOMY  1996   RSO for menorrhagia.   CORONARY ANGIOPLASTY WITH STENT PLACEMENT  06/21/2013   CORONARY STENT INTERVENTION N/A 05/30/2017   Procedure: Coronary Stent Intervention;  Surgeon: [name], MD;  Location: [facility];  Service: Cardiovascular;  Laterality: N/A;   FOOT SURGERY  2013   x2   INTRAVASCULAR PRESSURE WIRE/FFR STUDY N/A 05/30/2017   Procedure: Intravascular Pressure Wire/FFR Study;  Surgeon: [name], MD;  Location: [facility];  Service: Cardiovascular;  Laterality: N/A;   JOINT REPLACEMENT  9/10   rt total   LEFT HEART CATH AND CORONARY ANGIOGRAPHY N/A 05/30/2017   Procedure: Left Heart Cath and Coronary Angiography;  Surgeon: [name], MD;  Location: [facility];  Service: Cardiovascular;  Laterality: N/A;   nissen fundoplication  8469   ROTATOR CUFF REPAIR     TONSILLECTOMY  1950   TOTAL KNEE ARTHROPLASTY     right   TUBAL LIGATION  1980     Current Meds  Medication Sig   acetaminophen (TYLENOL) 500 MG tablet Take 1,000 mg by mouth every 6 (six) hours as needed for headache.   ALPRAZolam (XANAX) 0.25 MG tablet Take 1 tablet (0.25 mg total) by mouth daily as needed for anxiety.   aspirin EC 81 MG EC tablet Take 1 tablet (81 mg total) by mouth daily.   ezetimibe (ZETIA) 10 MG tablet TAKE 1/2 TABLET(5 MG) BY MOUTH DAILY   famotidine (PEPCID) 40 MG tablet Take 1 tablet by mouth at bedtime.   ferrous sulfate 325 (65 FE) MG EC tablet Take 325 mg by mouth 2 (two) times a day.   losartan (COZAAR) 50 MG tablet Take 0.5 tablets (25 mg total) by mouth daily.   metoprolol succinate (TOPROL-XL) 25 MG 24 hr tablet TAKE 1/2 TABLET(12.5 MG) BY MOUTH DAILY   nitroGLYCERIN  (NITROSTAT) 0.4 MG SL tablet Place 1 tablet (0.4 mg total) under the tongue every 5 (five) minutes x 3 doses as needed for chest pain.   pantoprazole (PROTONIX) 40 MG tablet Take 40 mg by mouth daily.   pravastatin (PRAVACHOL) 10 MG tablet TAKE 1 TABLET EVERY EVENING   traMADol (ULTRAM) 50 MG tablet Take 1 tablet (50 mg total) by mouth every 8 (eight) hours as needed for moderate pain or severe pain.     Allergies:   Aspirin; Pravastatin; Flagyl [metronidazole]; Nsaids; Pravastatin sodium; and Statins   Social History   Tobacco Use   Smoking status: Never Smoker   Smokeless tobacco: Never Used  Substance Use Topics  Alcohol use: No    Alcohol/week: 0.0 standard drinks    Frequency: Never   Drug use: No     Family Hx: The patient's family history includes Asthma in her maternal grandmother; Cancer in her maternal grandfather and mother; Emphysema in her maternal grandmother; Heart attack in her father; Heart disease in her father; Hepatitis C in her mother; Hypertension in her daughter; Panic disorder in her daughter. There is no history of Stroke.  ROS:   Please see the history of present illness.    She denies any melena or hematochezia. All other systems reviewed and are negative.   Prior CV studies:   The following studies were reviewed today:  Event Monitor 04/2019 The basic rhythm is normal sinus There is no atrial fibrillation or flutter There are no bradycardic events There are few PVCs and occasional ventricular runs up to 9 beats  Cardiac catheterization 05/30/2017 LAD proximal 30 LCx proximal 70 at proximal edge of stent; OM1 stent patent RCA mid 40 PCI: 3.5 x 16 mm Promus Premier DES to the proximal LCx   Stress echocardiogram 05/20/2017 - Abnormal stress echo.   She has normal LV function at rest.   The stress apical 2 images is slightly rotated but shows   hypokinesis in the apical inferior ( inferior septal ) region   Suggest cardiac cath   Myoview  02/01/15 Intermediate risk, inferolateral and anterolateral defect most likely secondary to diaphragmatic attenuation, no ischemia.  BP did drop 152/49 >> 137/52mmHg but BP in recovery was 162/55 >> may be tech error; EF 59%.     Echocardiogram 05/2013 Mild focal basal septal hypertrophy, EF 50-55%, apical HK, grade 1 diastolic dysfunction, trivial AI   Cardiac Cath/PCI 05/2013 LM:  20% LAD:   Mid-vessel with 30-40% stenosis. Small diagonal occluded fills from left-left collaterals with late filling. LCx:   1st OM with 90% stenosis, 2nd Om with mild diffuse 30-40% stenosis   RCA:   Mid-vessel with 50-60% stenosis.   Large lateral wall/anterolateral AK, EF 35-40%. PCI Data:  Proximal OM Stent 2.75x16 mm Promus   Carotid US 12/2010 Bilat ICA < 50%     Labs/Other Tests and Data Reviewed:    EKG:  No ECG reviewed.  Recent Labs: 03/23/2019: BUN 15; Creatinine, Ser 0.72; Hemoglobin 8.0; Platelets 192; Potassium 4.8; Sodium 142   Recent Lipid Panel Lab Results  Component Value Date/Time   CHOL 148 04/28/2017 10:03 AM   CHOL 153 07/29/2014 09:26 AM   TRIG 126 04/28/2017 10:03 AM   TRIG 112 07/29/2014 09:26 AM   HDL 45 04/28/2017 10:03 AM   HDL 49 07/29/2014 09:26 AM   CHOLHDL 3.3 04/28/2017 10:03 AM   CHOLHDL 2.6 11/23/2015 09:12 AM   LDLCALC 78 04/28/2017 10:03 AM   LDLCALC 82 07/29/2014 09:26 AM    Wt Readings from Last 3 Encounters:  05/03/19 123 lb (55.8 kg)  03/17/19 124 lb 6.4 oz (56.4 kg)  01/29/19 126 lb (57.2 kg)     Objective:    Vital Signs:  BP 134/73    Pulse 67    Ht 5\' 1"  (1.549 m)    Wt 123 lb (55.8 kg)    LMP 11/25/1994    BMI 23.24 kg/m    VITAL SIGNS:  reviewed GEN:  no acute distress RESPIRATORY:  No labored breathing NEURO:  Alert and oriented PSYCH:  Normal mood  ASSESSMENT & [name]:    Syncope, unspecified syncope type No recurrence.  Recent event monitor demonstrated  no significant arrhythmia.  She has felt better since her losartan was  discontinued.  Coronary artery disease involving native coronary artery of native heart without angina pectoris History of lateral MI in 2014 treated with DES to the OM1.  She underwent DES to the LCx in 2018 in the setting of worsening angina.    She has had some occasional chest pains but no significant symptoms to suggest recurrent angina.  Continue aspirin, pravastatin, ezetimibe, metoprolol.  Essential hypertension  Her blood pressure has been somewhat elevated at times.  However, she is sometimes able to check it later and its improved.  She does have some issues with anxiety.  I have asked her to monitor her blood pressure further.  If her pressure is elevated, she should wait an hour and make sure she is seated and relaxed for at least 15 minutes before she checks it again.  If she continues to get higher readings, she knows to contact us so that we can further adjust her therapy.  Time:   Today, I have spent 17 minutes with the patient with telehealth technology discussing the above problems.     Medication Adjustments/Labs and Tests Ordered: Current medicines are reviewed at length with the patient today.  Concerns regarding medicines are outlined above.   Tests Ordered: No orders of the defined types were placed in this encounter.   Medication Changes: No orders of the defined types were placed in this encounter.   Disposition:  Follow up in 6 month(s) with Dr. Burt Knack or Richardson Dopp, PA-C  Signed, Richardson Dopp, PA-C  05/03/2019 4:24 PM    [place]

## 2019-05-06 ENCOUNTER — Telehealth: Payer: Self-pay | Admitting: Physician Assistant

## 2019-05-06 NOTE — Telephone Encounter (Signed)
New Message     Pt is wondering if she is allowed to drive or walk by herself now that the dizziness has left her    Please call

## 2019-05-07 NOTE — Telephone Encounter (Signed)
Spoke with patient and wanted to know since she hasnt been having dizziness anymore and hasnt fainted.   When could she possibly get back to her normal activity. Patient.knows that it may not be soon, but she's feeling better and just wants to know.

## 2019-05-07 NOTE — Telephone Encounter (Signed)
Follow up   Patient is following up on whether or not she can resume walking. She advises that a vm can be left with the response if she misses the call.

## 2019-05-09 NOTE — Telephone Encounter (Signed)
She can slowly resume activities. I think she can also resume driving as long as she feels ok and is not having any dizzy spells. Richardson Dopp, PA-C    05/09/2019 9:06 PM

## 2019-05-10 NOTE — Telephone Encounter (Signed)
Informed patient she may slowly resume activities as tolerated.  Informed her she may resume driving as long as she feels OK and is not having dizzy spells.  She was grateful for assistance.

## 2019-05-12 ENCOUNTER — Telehealth: Payer: Self-pay | Admitting: *Deleted

## 2019-05-12 NOTE — Telephone Encounter (Signed)
Patient called stating she fainted about 3-4 weeks ago while walking up her driveway. Her GI Md check labs and hemoglobin was 8 and recent hemoglobin now 9.2 taking 2 iron supplements daily. She had her  Prolia injection on 04/01/19, she saw ad on TV that said if you have low hemoglobin Prolia is not recommended. Patient wanted to know your thoughts about this. Please advise

## 2019-05-13 NOTE — Telephone Encounter (Signed)
Patient informed. 

## 2019-05-13 NOTE — Telephone Encounter (Signed)
For now I would hold Prolia until they figure out why her hemoglobin is so low and get it corrected.

## 2019-05-22 ENCOUNTER — Telehealth: Payer: Self-pay | Admitting: Medical

## 2019-05-22 NOTE — Telephone Encounter (Signed)
Patient called the answering service with complaints of a "jabbing" sensation under her left breast.The first incident occurred 1.5 weeks ago. She reported fleeting pain for which she took 1 SL nitro and pain resolved. She felt fine until last night when she noticed an aching sensation under her left breast which resolves spontaneously. They continued to occur intermittently throughout the day today, never lasting more than a minute and resolves spontaneously. She has not used any SL nitro over the past 24 hours. Symptoms are mostly occurring after laying down. No associated SOB, palpitations, diaphoresis, nausea, or vomiting. Symptoms are not similar to prior angina. She reported stable HR in the 50s-60s and BP 129/73 this morning. No complaints of dizziness, lightheadedness, or syncope. She is currently chest pain free. Symptoms do not sound characteristic of prior angina or cardiac related pain. Suspect GI etiology as she reported some improvement with gaviscon. Favor watchful waiting at this time. Reviewed SL nitro instructions and reasons to present to the ED for further evaluation. Patient was in agreement with the plan.   Abigail Butts, PA-C 05/22/19; 5:26 PM

## 2019-06-02 ENCOUNTER — Other Ambulatory Visit: Payer: Self-pay | Admitting: Cardiovascular Disease

## 2019-06-15 ENCOUNTER — Encounter: Payer: Self-pay | Admitting: Gynecology

## 2019-08-11 ENCOUNTER — Other Ambulatory Visit: Payer: Self-pay

## 2019-08-11 ENCOUNTER — Encounter: Payer: Self-pay | Admitting: Gynecology

## 2019-08-11 ENCOUNTER — Ambulatory Visit (INDEPENDENT_AMBULATORY_CARE_PROVIDER_SITE_OTHER): Payer: Medicare PPO | Admitting: Gynecology

## 2019-08-11 VITALS — BP 122/76

## 2019-08-11 DIAGNOSIS — N765 Ulceration of vagina: Secondary | ICD-10-CM | POA: Diagnosis not present

## 2019-08-11 DIAGNOSIS — N939 Abnormal uterine and vaginal bleeding, unspecified: Secondary | ICD-10-CM

## 2019-08-11 DIAGNOSIS — Z1272 Encounter for screening for malignant neoplasm of vagina: Secondary | ICD-10-CM

## 2019-08-11 NOTE — Patient Instructions (Signed)
Office will call with biopsy results 

## 2019-08-11 NOTE — Addendum Note (Signed)
Addended by: Nelva Nay on: 08/11/2019 03:48 PM   Modules accepted: Orders

## 2019-08-11 NOTE — Progress Notes (Signed)
    Kristine [name] 1941-03-03 [id number]        78 y.o.  G1P1 with history of vaginal bleeding today.  She had a prior episode of vaginal bleeding 2016 which was felt to be due to rotation of her cystocele but could never demonstrate a source.  Was asked to return if she had any recurrence while she is bleeding.  She is having no pain or urinary symptoms such as frequency dysuria urgency fever or chills.  Status post TAH RSO in the past.  Is being evaluated for anemia by her primary physician with a hemoglobin of 8.  Had an appointment with her gastroenterologist last week.  Past medical history,surgical history, problem list, medications, allergies, family history and social history were all reviewed and documented in the EPIC chart.  Directed ROS with pertinent positives and negatives documented in the history of present illness/assessment and plan.  Exam: Caryn Bee assistant Vitals:   08/11/19 1507  BP: 122/76   General appearance:  Normal External BUS vagina with atrophic changes.  Small red area with slight staining on proctoscopy swab mid vaginal mucosa at leading point of her cystocele.  Bimanual exam without masses or tenderness.  No palpable abnormalities along the vaginal mucosa.  Colposcopy performed shows atrophic changes with small red area mid anterior vaginal mucosa.  This area was biopsied superficially with silver nitrate applied afterwards.  Patient tolerated well.  No other abnormalities were noted on colposcopy.  Assessment/Plan:  77 y.o. G1P1 small bleeding point mid vaginal mucosa suspect due to irritation of her cystocele.  Biopsy taken over this area to rule out possible tumor.  Patient will follow-up for biopsy results.  Otherwise will monitor for now as long as bleeding resolves will follow.  Possible vaginal estrogen supplementation discussed.  Will hold for now.    Anastasio Auerbach MD, 3:25 PM 08/11/2019

## 2019-08-13 LAB — PAP IG W/ RFLX HPV ASCU

## 2019-08-13 LAB — TISSUE SPECIMEN

## 2019-08-13 LAB — PATHOLOGY REPORT

## 2019-09-02 ENCOUNTER — Encounter: Payer: Self-pay | Admitting: Gynecology

## 2019-09-06 ENCOUNTER — Emergency Department (INDEPENDENT_AMBULATORY_CARE_PROVIDER_SITE_OTHER): Payer: Medicare PPO

## 2019-09-06 ENCOUNTER — Emergency Department
Admission: EM | Admit: 2019-09-06 | Discharge: 2019-09-06 | Disposition: A | Discharge status: Home / Self Care | Payer: Medicare PPO | Source: Home / Self Care

## 2019-09-06 ENCOUNTER — Other Ambulatory Visit: Payer: Self-pay

## 2019-09-06 DIAGNOSIS — R05 Cough: Secondary | ICD-10-CM

## 2019-09-06 DIAGNOSIS — R059 Cough, unspecified: Secondary | ICD-10-CM

## 2019-09-06 DIAGNOSIS — R0982 Postnasal drip: Secondary | ICD-10-CM

## 2019-09-06 MED ORDER — BENZONATATE 100 MG PO CAPS: 100.0000 mg | ORAL_CAPSULE | Freq: Three times a day (TID) | ORAL | 0 refills | Status: DC | PRN

## 2019-09-06 MED ORDER — FLUTICASONE PROPIONATE 50 MCG/ACT NA SUSP: 2.0000 | Freq: Every day | NASAL | 2 refills | Status: DC

## 2019-09-06 MED ORDER — CETIRIZINE HCL 5 MG PO TABS: 5.0000 mg | ORAL_TABLET | Freq: Every day | ORAL | 0 refills | Status: DC

## 2019-09-06 NOTE — ED Provider Notes (Signed)
[name]    CSN: [id number] Arrival date & time: 09/06/19  1130      History   Chief Complaint Chief Complaint  Patient presents with  . Cough    HPI Channy Knibbs is a 78 y.o. female.   HPI Gina Cervantez is a 78 y.o. female presenting to UC with c/o 1 week of nasal congestion, mildly productive cough and post-nasal drip.  She also had some abdominal cramping and loose stools last week, which have since resolved.  She had 2 negative Covid tests last week, one was rapid and the other was a send out.  Pt's family member encouraged her to be evaluated today to make sure she did not have bronchitis.  Pt reports hx of similar symptoms with cough and congestion each October. She does not take any allergic medication. Denies fever, chills, n/v/d. No chest pain or SOB.   Past Medical History:  Diagnosis Date  . Anemia   . Arthritis   . Barrett's esophagus    Stage B  . CAD (coronary artery disease)    a. s/p STEMI 7/14 >> LHC: LM 20, mLAD 30-40, small Dx 100 (L-L collats), OM1 90, OM2 30-40, mRCA 50-60, Lat/AL akinesis, EF 35-40% >> PCI: Promus DES to OM1;  b. Myoview 3/16: diaph atten, no ischemia, EF 59% (intermediate risk b/c BP drop 152>>137, but recovery BP 162 (tech error) >> med Rx continued  c. cath 05/30/17 s/p FFR guided PCI with DES to circumflex/first OM   . CREST variant of scleroderma ([place])   . Cystocele   . Diverticulitis   . Event monitor 04/2019   Event monitor 04/2019: Sinus rhythm; no atrial fibrillation/flutter; no bradycardic events; few PVCs and occasional ventricular runs up to 9 beats  . GERD (gastroesophageal reflux disease)   . History of blood transfusion    hx of being Anti-K positive  . History of Doppler ultrasound    a. Carotid US 2/12: bilateral ICA < 50%  . Hyperlipidemia   . Hypertension   . Ischemic cardiomyopathy    a. EF 35-40% by LHC at time of MI;  b. Echo 7/14:  Mild focal basal septal hypertrophy, EF 50-55%, apical HK, grade 1  diastolic dysfunction, trivial AI   . Ischemic colitis ([place])   . Osteoporosis 12/2017   T score -2.6  . Raynaud disease   . Rectocele    mild    Patient Active Problem List   Diagnosis Date Noted  . Psychophysiological insomnia 09/24/2017  . Abnormal nuclear cardiac imaging test 05/30/2017  . Abnormal stress echo   . Solar lentigo 11/08/2016  . Acute blood loss anemia 10/10/2015  . Colitis 10/08/2015  . CAD (coronary artery disease), native coronary artery 06/27/2013  . Hyperlipidemia 06/27/2013  . Precordial pain 06/26/2013  . History of ST elevation myocardial infarction (STEMI) 06/24/2013  . Hypertension   . GERD (gastroesophageal reflux disease)   . Barrett's esophagus   . Cystocele   . Rectocele     Past Surgical History:  Procedure Laterality Date  . ABDOMINAL HYSTERECTOMY  1996   RSO for menorrhagia.  . CORONARY ANGIOPLASTY WITH STENT PLACEMENT  06/21/2013  . CORONARY STENT INTERVENTION N/A 05/30/2017   Procedure: Coronary Stent Intervention;  Surgeon: [name], MD;  Location: [facility];  Service: Cardiovascular;  Laterality: N/A;  . FOOT SURGERY  2013   x2  . INTRAVASCULAR PRESSURE WIRE/FFR STUDY N/A 05/30/2017   Procedure: Intravascular Pressure Wire/FFR Study;  Surgeon: Burt Knack,  Legrand Como, MD;  Location: [facility];  Service: Cardiovascular;  Laterality: N/A;  . JOINT REPLACEMENT  9/10   rt total  . LEFT HEART CATH AND CORONARY ANGIOGRAPHY N/A 05/30/2017   Procedure: Left Heart Cath and Coronary Angiography;  Surgeon: [name], MD;  Location: [facility];  Service: Cardiovascular;  Laterality: N/A;  . nissen fundoplication  123456  . ROTATOR CUFF REPAIR    . TONSILLECTOMY  1950  . TOTAL KNEE ARTHROPLASTY     right  . TUBAL LIGATION  1980    OB History    Gravida  1   Para  1   Term      Preterm      AB      Living  1     SAB      TAB      Ectopic      Multiple      Live Births               Home  Medications    Prior to Admission medications   Medication Sig Start Date End Date Taking? Authorizing Provider  acetaminophen (TYLENOL) 500 MG tablet Take 1,000 mg by mouth every 6 (six) hours as needed for headache.    [provider]  ALPRAZolam Duanne Moron) 0.25 MG tablet Take 1 tablet (0.25 mg total) by mouth daily as needed for anxiety. 09/17/17   Wardell Honour, MD  aspirin EC 81 MG EC tablet Take 1 tablet (81 mg total) by mouth daily. 06/24/13   Barrett, Evelene Croon, PA-C  benzonatate (TESSALON) 100 MG capsule Take 1-2 capsules (100-200 mg total) by mouth 3 (three) times daily as needed for cough. 09/06/19   Noe Gens, PA-C  cetirizine (ZYRTEC) 5 MG tablet Take 1 tablet (5 mg total) by mouth daily. 09/06/19   Noe Gens, PA-C  ezetimibe (ZETIA) 10 MG tablet TAKE 1/2 TABLET EVERY DAY 06/02/19   [name], MD  famotidine (PEPCID) 40 MG tablet Take 1 tablet by mouth at bedtime. 12/21/18   [provider]  ferrous sulfate 325 (65 FE) MG EC tablet Take 325 mg by mouth 2 (two) times a day.    [provider]  fluticasone (FLONASE) 50 MCG/ACT nasal spray Place 2 sprays into both nostrils daily. 09/06/19   Noe Gens, PA-C  metoprolol succinate (TOPROL-XL) 25 MG 24 hr tablet TAKE 1/2 TABLET(12.5 MG) BY MOUTH DAILY 01/08/19   [name], MD  nitroGLYCERIN (NITROSTAT) 0.4 MG SL tablet Place 1 tablet (0.4 mg total) under the tongue every 5 (five) minutes x 3 doses as needed for chest pain. 01/04/19   [name], MD  pantoprazole (PROTONIX) 40 MG tablet Take 40 mg by mouth daily.    [provider]  pravastatin (PRAVACHOL) 10 MG tablet TAKE 1 TABLET EVERY EVENING 03/29/19   Weaver, Scott T, PA-C  traMADol (ULTRAM) 50 MG tablet Take 1 tablet (50 mg total) by mouth every 8 (eight) hours as needed for moderate pain or severe pain. Patient not taking: Reported on 08/11/2019 01/29/19   Tyrell Antonio    Family History Family History  Problem Relation  Age of Onset  . Cancer Mother        Pancreatic Cancer  . Hepatitis C Mother   . Heart disease Father        congestive heart failure  . Heart attack Father   . Panic disorder Daughter   . Hypertension Daughter   .  Asthma Maternal Grandmother   . Emphysema Maternal Grandmother   . Cancer Maternal Grandfather        lung  . Stroke Neg Hx     Social History Social History   Tobacco Use  . Smoking status: Never Smoker  . Smokeless tobacco: Never Used  Substance Use Topics  . Alcohol use: No    Alcohol/week: 0.0 standard drinks    Frequency: Never  . Drug use: No     Allergies   Aspirin, Pravastatin, Flagyl [metronidazole], Nsaids, Pravastatin sodium, and Statins   Review of Systems Review of Systems  Constitutional: Negative for chills and fever.  HENT: Positive for congestion and postnasal drip. Negative for ear pain, sore throat, trouble swallowing and voice change.   Respiratory: Positive for cough. Negative for shortness of breath.   Cardiovascular: Negative for chest pain and palpitations.  Gastrointestinal: Negative for abdominal pain, diarrhea, nausea and vomiting.  Musculoskeletal: Negative for arthralgias, back pain and myalgias.  Skin: Negative for rash.     Physical Exam Triage Vital Signs ED Triage Vitals  Enc Vitals Group     BP 09/06/19 1148 127/85     Pulse Rate 09/06/19 1148 98     Resp 09/06/19 1148 20     Temp 09/06/19 1148 98.5 F (36.9 C)     Temp Source 09/06/19 1148 Oral     SpO2 09/06/19 1148 99 %     Weight 09/06/19 1152 120 lb (54.4 kg)     Height 09/06/19 1152 5\' 1"  (1.549 m)     Head Circumference --      Peak Flow --      Pain Score 09/06/19 1151 0     Pain Loc --      Pain Edu? --      Excl. in [place]? --    No data found.  Updated Vital Signs BP 127/85 (BP Location: Right Arm)   Pulse 98   Temp 98.5 F (36.9 C) (Oral)   Resp 20   Ht 5\' 1"  (1.549 m)   Wt 120 lb (54.4 kg)   LMP 11/25/1994   SpO2 99%   BMI 22.67 kg/m    Visual Acuity Right Eye Distance:   Left Eye Distance:   Bilateral Distance:    Right Eye Near:   Left Eye Near:    Bilateral Near:     Physical Exam Vitals signs and nursing note reviewed.  Constitutional:      Appearance: Normal appearance. She is well-developed.  HENT:     Head: Normocephalic and atraumatic.     Right Ear: Tympanic membrane and ear canal normal.     Left Ear: Tympanic membrane and ear canal normal.     Nose: Nose normal.     Mouth/Throat:     Mouth: Mucous membranes are moist.  Neck:     Musculoskeletal: Normal range of motion.  Cardiovascular:     Rate and Rhythm: Normal rate and regular rhythm.  Pulmonary:     Effort: Pulmonary effort is normal. No respiratory distress.     Breath sounds: Normal breath sounds. No stridor. No wheezing, rhonchi or rales.  Musculoskeletal: Normal range of motion.  Skin:    General: Skin is warm and dry.  Neurological:     Mental Status: She is alert and oriented to person, place, and time.  Psychiatric:        Behavior: Behavior normal.      UC Treatments / Results  Labs (all labs  ordered are listed, but only abnormal results are displayed) Labs Reviewed - No data to display  EKG   Radiology Dg Chest 2 View  Result Date: 09/06/2019 CLINICAL DATA:  Cough for 2 weeks EXAM: CHEST - 2 VIEW COMPARISON:  None. FINDINGS: The heart size is normal. Vascular calcifications are seen in the aortic arch. Both lungs are clear. The visualized skeletal structures are unremarkable. IMPRESSION: No active pulmonary disease. Aortic Atherosclerosis (ICD10-I70.0). Electronically Signed   By: Zerita Boers M.D.   On: 09/06/2019 12:35    Procedures Procedures (including critical care time)  Medications Ordered in UC Medications - No data to display  Initial Impression / Assessment and Plan / UC Course  I have reviewed the triage vital signs and the nursing notes.  Pertinent labs & imaging results that were available during  my care of the patient were reviewed by me and considered in my medical decision making (see chart for details).    CXR performed to r/o early pneumonia or bronchitis Reassured pt of normal CXR Due similar symptoms annually and lack of fever, chills, fatigue, or body aches, suspect allergic rhinitis rather than bacterial or viral illness causing symptoms AVS provided  Final Clinical Impressions(s) / UC Diagnoses   Final diagnoses:  Cough  Post-nasal drip     Discharge Instructions      Your symptoms appear to be due to allergies. Please try the medications prescribed today. For best results, it is recommended you take the cetirizine once daily as well as use the Flonase nasal spray daily for at least 1-2 weeks to see the best improvement. You can continue these medications daily for seasonal allergies.  You may also try the cough medication, benzonatate (Tessalon pearls) to help with cough, especially at night.   Please follow up with family medicine in 1 week if not improving, sooner if you develop new concerning symptoms such as chest pain, trouble breathing or fever.     ED Prescriptions    Medication Sig Dispense Auth. Provider   cetirizine (ZYRTEC) 5 MG tablet Take 1 tablet (5 mg total) by mouth daily. 30 tablet Ashby Leflore O, PA-C   fluticasone (FLONASE) 50 MCG/ACT nasal spray Place 2 sprays into both nostrils daily. 16 g Araya Roel O, PA-C   benzonatate (TESSALON) 100 MG capsule Take 1-2 capsules (100-200 mg total) by mouth 3 (three) times daily as needed for cough. 21 capsule Noe Gens, [place]     PDMP not reviewed this encounter.   Noe Gens, PA-C 09/06/19 1435

## 2019-09-06 NOTE — ED Triage Notes (Signed)
Pt states that last week she had congestion and cough.  Then progressed to abd pain and cramping.  Had 2 covid test last week.  Rapid test was negative.

## 2019-09-06 NOTE — Discharge Instructions (Signed)
°  Your symptoms appear to be due to allergies. Please try the medications prescribed today. For best results, it is recommended you take the cetirizine once daily as well as use the Flonase nasal spray daily for at least 1-2 weeks to see the best improvement. You can continue these medications daily for seasonal allergies.  You may also try the cough medication, benzonatate (Tessalon pearls) to help with cough, especially at night.   Please follow up with family medicine in 1 week if not improving, sooner if you develop new concerning symptoms such as chest pain, trouble breathing or fever.

## 2019-09-16 ENCOUNTER — Other Ambulatory Visit: Payer: Self-pay | Admitting: Cardiovascular Disease

## 2019-10-06 ENCOUNTER — Ambulatory Visit: Payer: Medicare PPO | Admitting: Gynecology

## 2019-10-15 ENCOUNTER — Telehealth: Payer: Self-pay | Admitting: *Deleted

## 2019-10-15 NOTE — Telephone Encounter (Addendum)
Running summary of benefits for 2021 [place] to expedite her SOB spoke with Cornelia and Merry Proud said he will      Annual exam UPCOMING VISIT 02/22/2020  Calcium  9.7       Date 03/23/2019  Upcoming dental procedures NO  Prior Authorization YES on file valid through 11/24/2020  Pt estimated Cost $263   Prolia same day as annual   Coverage Details: 20% one dose,$50 admin fee

## 2019-10-20 ENCOUNTER — Other Ambulatory Visit: Payer: Self-pay

## 2019-10-25 ENCOUNTER — Ambulatory Visit: Payer: Medicare PPO | Admitting: Gynecology

## 2019-11-05 ENCOUNTER — Encounter: Payer: Self-pay | Admitting: Cardiovascular Disease

## 2019-11-05 ENCOUNTER — Other Ambulatory Visit: Payer: Self-pay

## 2019-11-05 ENCOUNTER — Ambulatory Visit: Payer: Medicare PPO | Admitting: Cardiovascular Disease

## 2019-11-05 VITALS — BP 110/70 | HR 94 | Ht 61.0 in | Wt 126.0 lb

## 2019-11-05 DIAGNOSIS — E782 Mixed hyperlipidemia: Secondary | ICD-10-CM

## 2019-11-05 DIAGNOSIS — I10 Essential (primary) hypertension: Secondary | ICD-10-CM | POA: Diagnosis not present

## 2019-11-05 DIAGNOSIS — I2510 Atherosclerotic heart disease of native coronary artery without angina pectoris: Secondary | ICD-10-CM

## 2019-11-05 NOTE — Patient Instructions (Signed)
Medication Instructions:  Your provider recommends that you continue on your current medications as directed. Please refer to the Current Medication list given to you today.   *If you need a refill on your cardiac medications before your next appointment, please call your pharmacy*  Follow-Up: At [facility], you and your health needs are our priority.  As part of our continuing mission to provide you with exceptional heart care, we have created designated Provider Care Teams.  These Care Teams include your primary Cardiologist (physician) and Advanced Practice Providers (APPs -  Physician Assistants and Nurse Practitioners) who all work together to provide you with the care you need, when you need it. Your next appointment:   6 month(s) The format for your next appointment:   In Person Provider:   Richardson Dopp, PA

## 2019-11-05 NOTE — Progress Notes (Signed)
Cardiology Office Note:    Date:  11/05/2019   ID:  [name], DOB [birth year], MRN [id number]  PCP:  System, Provider Not In  Cardiologist:  [name], MD  Electrophysiologist:  None   Referring MD: No ref. provider found   Chief Complaint  Patient presents with  . Coronary Artery Disease    History of Present Illness:    Kristine [name] is a 78 y.o. female with a hx of coronary artery disease, presenting for follow-up evaluation.  The patient initially presented with a lateral wall STEMI in 2014, treated with primary PCI of the first obtuse marginal branch of the circumflex.  Comorbid medical conditions include hypertension, mixed hyperlipidemia, and crest syndrome.  She developed recurrent angina in 2018 and was found to have severe stenosis at the proximal edge of her previously implanted stent.  She was treated with stenting again.  She has done well since that time.  She had a vasovagal event several months ago, but was found to have anemia at the time with a hemoglobin of 8.  She has followed up with gastroenterology as well as her primary care doctor.  Her hemoglobin is reportedly normalized at on iron replacement therapy.  She currently has no complaints of chest pain, chest pressure, shortness of breath, leg swelling, heart palpitations, or recurrent lightheadedness/syncope.  Past Medical History:  Diagnosis Date  . Anemia   . Arthritis   . Barrett's esophagus    Stage B  . CAD (coronary artery disease)    a. s/p STEMI 7/14 >> LHC: LM 20, mLAD 30-40, small Dx 100 (L-L collats), OM1 90, OM2 30-40, mRCA 50-60, Lat/AL akinesis, EF 35-40% >> PCI: Promus DES to OM1;  b. Myoview 3/16: diaph atten, no ischemia, EF 59% (intermediate risk b/c BP drop 152>>137, but recovery BP 162 (tech error) >> med Rx continued  c. cath 05/30/17 s/p FFR guided PCI with DES to circumflex/first OM   . CREST variant of scleroderma ([place])   . Cystocele   . Diverticulitis   . Event monitor 04/2019   Event  monitor 04/2019: Sinus rhythm; no atrial fibrillation/flutter; no bradycardic events; few PVCs and occasional ventricular runs up to 9 beats  . GERD (gastroesophageal reflux disease)   . History of blood transfusion    hx of being Anti-K positive  . History of Doppler ultrasound    a. Carotid US 2/12: bilateral ICA < 50%  . Hyperlipidemia   . Hypertension   . Ischemic cardiomyopathy    a. EF 35-40% by LHC at time of MI;  b. Echo 7/14:  Mild focal basal septal hypertrophy, EF 50-55%, apical HK, grade 1 diastolic dysfunction, trivial AI   . Ischemic colitis ([place])   . Osteoporosis 12/2017   T score -2.6  . Raynaud disease   . Rectocele    mild    Past Surgical History:  Procedure Laterality Date  . ABDOMINAL HYSTERECTOMY  1996   RSO for menorrhagia.  . CORONARY ANGIOPLASTY WITH STENT PLACEMENT  06/21/2013  . CORONARY STENT INTERVENTION N/A 05/30/2017   Procedure: Coronary Stent Intervention;  Surgeon: [name], MD;  Location: [facility];  Service: Cardiovascular;  Laterality: N/A;  . FOOT SURGERY  2013   x2  . INTRAVASCULAR PRESSURE WIRE/FFR STUDY N/A 05/30/2017   Procedure: Intravascular Pressure Wire/FFR Study;  Surgeon: [name], MD;  Location: [facility];  Service: Cardiovascular;  Laterality: N/A;  . JOINT REPLACEMENT  9/10   rt total  .  LEFT HEART CATH AND CORONARY ANGIOGRAPHY N/A 05/30/2017   Procedure: Left Heart Cath and Coronary Angiography;  Surgeon: [name], MD;  Location: [facility];  Service: Cardiovascular;  Laterality: N/A;  . nissen fundoplication  123456  . ROTATOR CUFF REPAIR    . TONSILLECTOMY  1950  . TOTAL KNEE ARTHROPLASTY     right  . TUBAL LIGATION  1980    Current Medications: Current Meds  Medication Sig  . acetaminophen (TYLENOL) 500 MG tablet Take 1,000 mg by mouth every 6 (six) hours as needed for headache.  . ALPRAZolam (XANAX) 0.25 MG tablet Take 1 tablet (0.25 mg total) by mouth daily as needed for anxiety.   Marland Kitchen aspirin EC 81 MG EC tablet Take 1 tablet (81 mg total) by mouth daily.  . benzonatate (TESSALON) 100 MG capsule Take 1-2 capsules (100-200 mg total) by mouth 3 (three) times daily as needed for cough.  . cetirizine (ZYRTEC) 5 MG tablet Take 1 tablet (5 mg total) by mouth daily.  Marland Kitchen ezetimibe (ZETIA) 10 MG tablet TAKE 1/2 TABLET EVERY DAY  . famotidine (PEPCID) 40 MG tablet Take 1 tablet by mouth at bedtime.  . ferrous sulfate 325 (65 FE) MG EC tablet Take 325 mg by mouth 2 (two) times a day.  . fluticasone (FLONASE) 50 MCG/ACT nasal spray Place 2 sprays into both nostrils daily.  . metoprolol succinate (TOPROL-XL) 25 MG 24 hr tablet TAKE 1/2 TABLET EVERY DAY  . nitroGLYCERIN (NITROSTAT) 0.4 MG SL tablet Place 1 tablet (0.4 mg total) under the tongue every 5 (five) minutes x 3 doses as needed for chest pain.  . pantoprazole (PROTONIX) 40 MG tablet Take 40 mg by mouth daily.  . pravastatin (PRAVACHOL) 10 MG tablet TAKE 1 TABLET EVERY EVENING  . traMADol (ULTRAM) 50 MG tablet Take 1 tablet (50 mg total) by mouth every 8 (eight) hours as needed for moderate pain or severe pain.     Allergies:   Aspirin, Pravastatin, Flagyl [metronidazole], Nsaids, Pravastatin sodium, and Statins   Social History   Socioeconomic History  . Marital status: Divorced    Spouse name: Not on file  . Number of children: Not on file  . Years of education: Not on file  . Highest education level: Not on file  Occupational History  . Not on file  Tobacco Use  . Smoking status: Never Smoker  . Smokeless tobacco: Never Used  Substance and Sexual Activity  . Alcohol use: No    Alcohol/week: 0.0 standard drinks  . Drug use: No  . Sexual activity: Never    Comment: 1st intercourse 63 yo-1 partner  Other Topics Concern  . Not on file  Social History Narrative   Exercise dancing 3 x times weekly for 1-2 hours   Social Determinants of Health   Financial Resource Strain:   . Difficulty of Paying Living  Expenses: Not on file  Food Insecurity:   . Worried About Charity fundraiser in the Last Year: Not on file  . Ran Out of Food in the Last Year: Not on file  Transportation Needs:   . Lack of Transportation (Medical): Not on file  . Lack of Transportation (Non-Medical): Not on file  Physical Activity:   . Days of Exercise per Week: Not on file  . Minutes of Exercise per Session: Not on file  Stress:   . Feeling of Stress : Not on file  Social Connections:   . Frequency of Communication with Friends  and Family: Not on file  . Frequency of Social Gatherings with Friends and Family: Not on file  . Attends Religious Services: Not on file  . Active Member of Clubs or Organizations: Not on file  . Attends Archivist Meetings: Not on file  . Marital Status: Not on file     Family History: The patient's family history includes Asthma in her maternal grandmother; Cancer in her maternal grandfather and mother; Emphysema in her maternal grandmother; Heart attack in her father; Heart disease in her father; Hepatitis C in her mother; Hypertension in her daughter; Panic disorder in her daughter. There is no history of Stroke.  ROS:   Please see the history of present illness.    All other systems reviewed and are negative.  EKGs/Labs/Other Studies Reviewed:    The following studies were reviewed today: Cardiac Cath 05-30-2017: Conclusion  1. Single-vessel coronary artery disease with moderately severe proximal left circumflex stenosis at the proximal edge of the previously implanted stent 2. Mild nonobstructive LAD and RCA stenoses 3. Normal LV systolic function based on noninvasive assessment 4. Successful FFR guided PCI with a 3.5 x 16 mm Promus DES in the proximal circumflex/first OM  Dual antiplatelet therapy with aspirin and Plavix for a minimum of 6 months   Coronary Findings  Diagnostic Dominance: Right Left Anterior Descending  Prox LAD to Mid LAD lesion 30% stenosed    diffuse nonobstructive plaquing  Left Circumflex  Prox Cx lesion 70% stenosed  lesion located at the proximal stent edge, eccentric lesion appears tighter than 70% in some views.  First Obtuse Marginal Branch  Ost 1st Mrg to 1st Mrg lesion 0% stenosed  Previously placed Ost 1st Mrg to 1st Mrg drug eluting stent is widely patent.  Right Coronary Artery  Mid RCA lesion 40% stenosed  unchanged from previous study  Intervention  Prox Cx lesion  Angioplasty  Lesion crossed with guidewire. Pre-stent angioplasty was performed. A STENT PROMUS PREM MR [id number] drug eluting stent was successfully placed, and overlaps previously placed stent. Post-stent angioplasty was not performed. The pre-interventional distal flow is normal (TIMI 3). The post-interventional distal flow is normal (TIMI 3). No complications occurred at this lesion. This stent is implanted in overlapping fashion into the proximal edge of the previously implanted OM1 stent. The stent extends from the proximal circumflex and of the OM1 stent.  There is a 0% residual stenosis post intervention.  Coronary Diagrams  Diagnostic Dominance: Right  Intervention    EKG:  EKG is not ordered today.   Recent Labs: 03/23/2019: BUN 15; Creatinine, Ser 0.72; Hemoglobin 8.0; Platelets 192; Potassium 4.8; Sodium 142  Recent Lipid Panel    Component Value Date/Time   CHOL 148 04/28/2017 1003   CHOL 153 07/29/2014 0926   TRIG 126 04/28/2017 1003   TRIG 112 07/29/2014 0926   HDL 45 04/28/2017 1003   HDL 49 07/29/2014 0926   CHOLHDL 3.3 04/28/2017 1003   CHOLHDL 2.6 11/23/2015 0912   VLDL 17 11/23/2015 0912   LDLCALC 78 04/28/2017 1003   LDLCALC 82 07/29/2014 0926    Physical Exam:    VS:  BP 110/70   Pulse 94   Ht 5\' 1"  (1.549 m)   Wt 126 lb (57.2 kg)   LMP 11/25/1994   SpO2 98%   BMI 23.81 kg/m     Wt Readings from Last 3 Encounters:  11/05/19 126 lb (57.2 kg)  09/06/19 120 lb (54.4 kg)  05/03/19 123 lb (55.8 kg)  GEN:  Well nourished, well developed in no acute distress HEENT: Normal NECK: No JVD; No carotid bruits LYMPHATICS: No lymphadenopathy CARDIAC: RRR, no murmurs, rubs, gallops RESPIRATORY:  Clear to auscultation without rales, wheezing or rhonchi  ABDOMEN: Soft, non-tender, non-distended MUSCULOSKELETAL:  No edema; No deformity  SKIN: Warm and dry NEUROLOGIC:  Alert and oriented x 3 PSYCHIATRIC:  Normal affect   ASSESSMENT:    1. Coronary artery disease involving native coronary artery of native heart without angina pectoris   2. Essential hypertension   3. Mixed hyperlipidemia    PLAN:    In order of problems listed above:  1. Patient is doing well from a cardiac perspective.  She will continue her current medical program without changes. 2. Blood pressure is well controlled.  She had a syncopal episode several months ago when she was taking losartan.  This has been discontinued.  She continues to run a blood pressure in the low normal range and she is feeling much better.  She is currently treated with metoprolol succinate. 3. Statin intolerant but currently able to take 10 mg of pravastatin in combination with Zetia 5 mg daily.   Medication Adjustments/Labs and Tests Ordered: Current medicines are reviewed at length with the patient today.  Concerns regarding medicines are outlined above.  No orders of the defined types were placed in this encounter.  No orders of the defined types were placed in this encounter.   There are no Patient Instructions on file for this visit.   Signed, [name], MD  11/05/2019 3:12 PM    [place]

## 2019-11-23 ENCOUNTER — Ambulatory Visit: Payer: Medicare PPO | Admitting: Cardiovascular Disease

## 2020-01-08 ENCOUNTER — Telehealth: Payer: Self-pay | Admitting: Student

## 2020-01-08 NOTE — Telephone Encounter (Signed)
   Received page from Answering Service that patient is having pain under left breast. Called and spoke with patient today. She reports that last night while laying down to go to sleep she had intermittent sharp pain on her left breast that would last for a couple of seconds and then go away. She was able to fall asleep but noted similar pain this morning. Interestingly, she took one dose of sublingual Nitro and pain resolved. No shortness of breath, nausea, vomiting, diaphoresis, palpitations, lightheadedness, or dizziness. Patient took her vitals this afternoon and BP was in the 120's/60's and pulse was in the 70's. She states this does not feel anything like prior cardiac chest pain which felt like a heaviness. She recently saw GI doctor for "gurgling" sensation on left side after eating and was told it was likely just gas. Currently chest pain free.  Reassured patient that pain does not sound cardiac in nature at this time. However, if pain continues and she requires more Nitro, recommended letting us know so that we can set her up with an office visit. If she develops any symptoms similar to prior cardiac chest pain, recommended she go to the ED for further evaluation. Patient voiced understanding, agreed, and thanked me for calling.  Darreld Mclean, PA-C 01/08/2020 1:23 PM

## 2020-01-17 ENCOUNTER — Other Ambulatory Visit: Payer: Self-pay | Admitting: Women's Health

## 2020-01-17 DIAGNOSIS — Z1231 Encounter for screening mammogram for malignant neoplasm of breast: Secondary | ICD-10-CM

## 2020-02-21 ENCOUNTER — Other Ambulatory Visit: Payer: Self-pay

## 2020-02-21 ENCOUNTER — Ambulatory Visit
Admission: RE | Admit: 2020-02-21 | Discharge: 2020-02-21 | Disposition: A | Discharge status: Home / Self Care | Payer: Medicare PPO | Source: Ambulatory Visit | Attending: Women's Health | Admitting: Women's Health

## 2020-02-21 DIAGNOSIS — Z1231 Encounter for screening mammogram for malignant neoplasm of breast: Secondary | ICD-10-CM

## 2020-02-22 ENCOUNTER — Ambulatory Visit: Payer: Medicare PPO | Admitting: Women's Health

## 2020-02-22 ENCOUNTER — Encounter: Payer: Self-pay | Admitting: Women's Health

## 2020-02-22 VITALS — BP 110/78 | Ht 61.0 in | Wt 124.0 lb

## 2020-02-22 DIAGNOSIS — E2839 Other primary ovarian failure: Secondary | ICD-10-CM | POA: Diagnosis not present

## 2020-02-22 DIAGNOSIS — M810 Age-related osteoporosis without current pathological fracture: Secondary | ICD-10-CM | POA: Diagnosis not present

## 2020-02-22 DIAGNOSIS — Z01419 Encounter for gynecological examination (general) (routine) without abnormal findings: Secondary | ICD-10-CM | POA: Diagnosis not present

## 2020-02-22 MED ORDER — DENOSUMAB 60 MG/ML ~~LOC~~ SOSY
60.0000 mg | PREFILLED_SYRINGE | Freq: Once | SUBCUTANEOUS | Status: AC
  Administered 2020-02-22: 60 mg via SUBCUTANEOUS

## 2020-02-22 NOTE — Patient Instructions (Addendum)
Vitamin D 2000 IUs daily It was a pleasure meeting you  Schedule bone density Check with primary Dr if need second pnuemonia vaccine, Tdap Health Maintenance After Age 77 After age 6, you are at a higher risk for certain long-term diseases and infections as well as injuries from falls. Falls are a major cause of broken bones and head injuries in people who are older than age 20. Getting regular preventive care can help to keep you healthy and well. Preventive care includes getting regular testing and making lifestyle changes as recommended by your health care provider. Talk with your health care provider about:  Which screenings and tests you should have. A screening is a test that checks for a disease when you have no symptoms.  A diet and exercise plan that is right for you. What should I know about screenings and tests to prevent falls? Screening and testing are the best ways to find a health problem early. Early diagnosis and treatment give you the best chance of managing medical conditions that are common after age 50. Certain conditions and lifestyle choices may make you more likely to have a fall. Your health care provider may recommend:  Regular vision checks. Poor vision and conditions such as cataracts can make you more likely to have a fall. If you wear glasses, make sure to get your prescription updated if your vision changes.  Medicine review. Work with your health care provider to regularly review all of the medicines you are taking, including over-the-counter medicines. Ask your health care provider about any side effects that may make you more likely to have a fall. Tell your health care provider if any medicines that you take make you feel dizzy or sleepy.  Osteoporosis screening. Osteoporosis is a condition that causes the bones to get weaker. This can make the bones weak and cause them to break more easily.  Blood pressure screening. Blood pressure changes and medicines to  control blood pressure can make you feel dizzy.  Strength and balance checks. Your health care provider may recommend certain tests to check your strength and balance while standing, walking, or changing positions.  Foot health exam. Foot pain and numbness, as well as not wearing proper footwear, can make you more likely to have a fall.  Depression screening. You may be more likely to have a fall if you have a fear of falling, feel emotionally low, or feel unable to do activities that you used to do.  Alcohol use screening. Using too much alcohol can affect your balance and may make you more likely to have a fall. What actions can I take to lower my risk of falls? General instructions  Talk with your health care provider about your risks for falling. Tell your health care provider if: ? You fall. Be sure to tell your health care provider about all falls, even ones that seem minor. ? You feel dizzy, sleepy, or off-balance.  Take over-the-counter and prescription medicines only as told by your health care provider. These include any supplements.  Eat a healthy diet and maintain a healthy weight. A healthy diet includes low-fat dairy products, low-fat (lean) meats, and fiber from whole grains, beans, and lots of fruits and vegetables. Home safety  Remove any tripping hazards, such as rugs, cords, and clutter.  Install safety equipment such as grab bars in bathrooms and safety rails on stairs.  Keep rooms and walkways well-lit. Activity   Follow a regular exercise program to stay fit. This will  help you maintain your balance. Ask your health care provider what types of exercise are appropriate for you.  If you need a cane or walker, use it as recommended by your health care provider.  Wear supportive shoes that have nonskid soles. Lifestyle  Do not drink alcohol if your health care provider tells you not to drink.  If you drink alcohol, limit how much you have: ? 0-1 drink a day  for women. ? 0-2 drinks a day for men.  Be aware of how much alcohol is in your drink. In the [place] one drink equals one typical bottle of beer (12 oz), one-half glass of wine (5 oz), or one shot of hard liquor (1 oz).  Do not use any products that contain nicotine or tobacco, such as cigarettes and e-cigarettes. If you need help quitting, ask your health care provider. Summary  Having a healthy lifestyle and getting preventive care can help to protect your health and wellness after age 39.  Screening and testing are the best way to find a health problem early and help you avoid having a fall. Early diagnosis and treatment give you the best chance for managing medical conditions that are more common for people who are older than age 68.  Falls are a major cause of broken bones and head injuries in people who are older than age 31. Take precautions to prevent a fall at home.  Work with your health care provider to learn what changes you can make to improve your health and wellness and to prevent falls. This information is not intended to replace advice given to you by your health care provider. Make sure you discuss any questions you have with your health care provider. Document Revised: 03/04/2019 Document Reviewed: 09/24/2017 Elsevier Patient Education  2020 [employer].

## 2020-02-22 NOTE — Progress Notes (Signed)
Kristine [name] 1941-07-24 [id number]    History:    Presents for breast and pelvic exam .  1998 TAH and RSO for benign reasons.  2020 benign vaginal biopsy degenerated necrotic tissue.  Not sexually active years.  Normal Pap and mammogram history.  2019 T score -2.6 hip,  3.4 forearm on Prolia tolerating well.  04/2018 - colonoscopy.  Barrett's esophagus 2017 - biopsy.  Primary care manages hypertension, hypercholesteremia and cardiac disease.  Has had 2 coronary stents..  2019 vitamin D 38.  Thinks she has only had 1 pneumonia vaccine.  Questioning if she should get the Covid vaccine.  Past medical history, past surgical history, family history and social history were all reviewed and documented in the EPIC chart.  1 daughter who is supportive.  Originally from [place].  ROS:  A ROS was performed and pertinent positives and negatives are included.  Exam:  Vitals:   02/22/20 1409  BP: 110/77  Weight: 124 lb (56.2 kg)  Height: 5\' 1"  (1.549 m)   Body mass index is 23.43 kg/m.   General appearance:  Normal Thyroid:  Symmetrical, normal in size, without palpable masses or nodularity. Respiratory  Auscultation:  Clear without wheezing or rhonchi Cardiovascular  Auscultation:  Regular rate, without rubs, murmurs or gallops  Edema/varicosities:  Not grossly evident Abdominal  Soft,nontender, without masses, guarding or rebound.  Liver/spleen:  No organomegaly noted  Hernia:  None appreciated  Skin  Inspection:  Grossly normal   Breasts: Examined lying and sitting.     Right: Without masses, retractions, discharge or axillary adenopathy.     Left: Without masses, retractions, discharge or axillary adenopathy. Gentitourinary   Inguinal/mons:  Normal without inguinal adenopathy  External genitalia:  Normal  BUS/Urethra/Skene's glands:  Normal  Vagina: +2 cystocele   Cervix: And uterus absent   Adnexa/parametria:     Rt: Without masses or tenderness.   Lt: Without masses or  tenderness.  Anus and perineum: Normal  Digital rectal exam: Normal sphincter tone without palpated masses or tenderness  Assessment/Plan:  79 y.o. D WF G1, P1 for breast and pelvic exam.  Postmenopausal on Prolia since 2019 tolerating well.   1998 TAH with RSO on no HRT/not sexually active +2 cystocele Primary care manages hypercholesteremia, hypertension, CAD, hx of MI,  labs and meds Barrett's esophagus, colitis, GERD -GI manages.  Plan: Cystocele discussed, has tried pessaries in the past without relief and had problems.  Osteoporosis reviewed importance of weightbearing and balance type exercise, yoga encouraged.  Home safety, fall prevention discussed.  We will continue Prolia 60 mg twice yearly.  SBEs, continue annual 3D screening mammogram.  Vitamin D 2000 IUs daily encouraged vitamin D 38.  Vaccines discussed encouraged second pneumonia vaccine, Covid vaccine has had Shingrix and a recent Tdap.  Pap screening guidelines reviewed.     [place], 2:39 PM 02/22/2020

## 2020-02-28 ENCOUNTER — Telehealth: Payer: Self-pay | Admitting: *Deleted

## 2020-02-28 NOTE — Telephone Encounter (Signed)
Patient spoke with the pharmacist at [facility] regarding vitamin d 2000 units daily. Patient said the pharmacist suggested patient discuss with office about taking Rx for vitamin d 5,000units weekly Rx and it is time released. I called patient and explained that the doctor prescribe a 50, 000 units Rx and it is weekly, but she doesn't need that much vitamin d Rx per [place] note recent level at 64. Patient understood and will pick up OTC vitamin d 2,000 units.

## 2020-03-14 ENCOUNTER — Ambulatory Visit: Payer: Medicare PPO | Admitting: Physician Assistant

## 2020-03-20 ENCOUNTER — Other Ambulatory Visit: Payer: Self-pay

## 2020-03-20 ENCOUNTER — Encounter (INDEPENDENT_AMBULATORY_CARE_PROVIDER_SITE_OTHER): Payer: Self-pay | Admitting: Ophthalmology

## 2020-03-20 ENCOUNTER — Ambulatory Visit (INDEPENDENT_AMBULATORY_CARE_PROVIDER_SITE_OTHER): Payer: Medicare PPO | Admitting: Ophthalmology

## 2020-03-20 DIAGNOSIS — H35351 Cystoid macular degeneration, right eye: Secondary | ICD-10-CM

## 2020-03-20 DIAGNOSIS — H35073 Retinal telangiectasis, bilateral: Secondary | ICD-10-CM

## 2020-03-20 DIAGNOSIS — H35071 Retinal telangiectasis, right eye: Secondary | ICD-10-CM | POA: Diagnosis not present

## 2020-03-20 DIAGNOSIS — H35072 Retinal telangiectasis, left eye: Secondary | ICD-10-CM

## 2020-03-20 DIAGNOSIS — G4733 Obstructive sleep apnea (adult) (pediatric): Secondary | ICD-10-CM | POA: Insufficient documentation

## 2020-03-20 NOTE — Assessment & Plan Note (Signed)
Macular telangiectasis (MAC-TEL), or parafoveal telangiectasis is a condition of "unknown" cause.  Findings in or near the macula (center of vision) consist of microaneurysms (leaking small capillaries), often with leakage of fluid which in the active phase can impact fine discriminatory vision, and in some cases trigger profound scarring in the macula, with severe permanent vision loss.  Standard treatment is observation and periodic examinations to monitor for treatable complications.   The cause  of this condition is "unknown".  However, the practice of Dr. Zadie Rhine has discovered an association with sleep apnea with its nightly periods of low oxygen in the blood stream (hypoxia), retained carbon dioxide (hypercapnia), associated with transient nocturnal hypertensive episodes.   More recently, some patients also been found to have advanced lung disease, whether asthma or COPD, with similar findings.  Dr. Zadie Rhine has been evaluating the association of sleep apnea, nightly hypoxia, and Macular telangiectasis for over 18 years.  Most patients are found to be noncompliant with sleep apnea therapy or testing in the past.  Resumption of CPAP or similar therapy is strongly recommended if ordered in the past.  Upon review of risk factors or findings positive for sleep apnea, more formal, extensive sleep laboratory or home testing, may be recommended.  Numerous patients, proven to have MAC-TEL, have improved or resolved their ey condition promptly, within weeks, of the use of nighttime oxygen supplementation or continuous positive airway pressure (CPAP).Macular telangiectasis (MAC-TEL), or parafoveal telangiectasis is a condition of "unknown" cause.  Findings in or near the macula (center of vision) consist of microaneurysms (leaking small capillaries), often with leakage of fluid which in the active phase can impact fine discriminatory vision, and in some cases trigger profound scarring in the macula, with severe permanent  vision loss.  Standard treatment is observation and periodic examinations to monitor for treatable complications.   The cause  of this condition is "unknown".  However, the practice of Dr. Zadie Rhine has discovered an association with sleep apnea with its nightly periods of low oxygen in the blood stream (hypoxia), retained carbon dioxide (hypercapnia), associated with transient nocturnal hypertensive episodes.   More recently, some patients also been found to have advanced lung disease, whether asthma or COPD, with similar findings.  Dr. Zadie Rhine has been evaluating the association of sleep apnea, nightly hypoxia, and Macular telangiectasis for over 18 years.  Most patients are found to be noncompliant with sleep apnea therapy or testing in the past.  Resumption of CPAP or similar therapy is strongly recommended if ordered in the past.  Upon review of risk factors or findings positive for sleep apnea, more formal, extensive sleep laboratory or home testing, may be recommended.  Numerous patients, proven to have MAC-TEL, have improved or resolved their ey condition promptly, within weeks, of the use of nighttime oxygen supplementation or continuous positive airway pressure (CPAP).

## 2020-03-20 NOTE — Progress Notes (Signed)
03/20/2020     CHIEF COMPLAINT Patient presents for Retina Follow Up   HISTORY OF PRESENT ILLNESS: Kristine [name] is a 79 y.o. female who presents to the clinic today for:   HPI    Retina Follow Up    Patient presents with  Other (Mac Tel).  In both eyes.  Severity is moderate.  Since onset it is gradually improving.  I, the attending physician,  performed the HPI with the patient and updated documentation appropriately.          Comments    1.5 Year f\u OU. OCT  Pt feels vision may have decrease. Pt denies FOL and floaters. Pt has not been using sleep oral device for a while due to dental work.       Last edited by Tilda Franco on 03/20/2020  1:32 PM. (History)      Referring physician: Hurman Horn, MD [place],  [place] [id number]  HISTORICAL INFORMATION:   Selected notes from the MEDICAL RECORD NUMBER       CURRENT MEDICATIONS: No current outpatient medications on file. (Ophthalmic Drugs)   No current facility-administered medications for this visit. (Ophthalmic Drugs)   Current Outpatient Medications (Other)  Medication Sig   acetaminophen (TYLENOL) 500 MG tablet Take 1,000 mg by mouth every 6 (six) hours as needed for headache.   ALPRAZolam (XANAX) 0.25 MG tablet Take 1 tablet (0.25 mg total) by mouth daily as needed for anxiety.   aspirin EC 81 MG EC tablet Take 1 tablet (81 mg total) by mouth daily.   cetirizine (ZYRTEC) 5 MG tablet Take 1 tablet (5 mg total) by mouth daily.   ezetimibe (ZETIA) 10 MG tablet TAKE 1/2 TABLET EVERY DAY   famotidine (PEPCID) 40 MG tablet Take 1 tablet by mouth at bedtime.   ferrous sulfate 325 (65 FE) MG EC tablet Take 325 mg by mouth 2 (two) times a day.   fluticasone (FLONASE) 50 MCG/ACT nasal spray Place 2 sprays into both nostrils daily.   metoprolol succinate (TOPROL-XL) 25 MG 24 hr tablet TAKE 1/2 TABLET EVERY DAY   nitroGLYCERIN (NITROSTAT) 0.4 MG SL tablet Place 1 tablet (0.4 mg total) under  the tongue every 5 (five) minutes x 3 doses as needed for chest pain.   pantoprazole (PROTONIX) 40 MG tablet Take 40 mg by mouth daily.   pravastatin (PRAVACHOL) 10 MG tablet TAKE 1 TABLET EVERY EVENING   traMADol (ULTRAM) 50 MG tablet Take 1 tablet (50 mg total) by mouth every 8 (eight) hours as needed for moderate pain or severe pain.   No current facility-administered medications for this visit. (Other)      REVIEW OF SYSTEMS:    ALLERGIES Allergies  Allergen Reactions   Aspirin Other (See Comments)    Other reaction(s): Other Can take 81 mg but not aspirin based medications Pt is prone to bleeding, ok with low dose coated aspirin   Pravastatin     Other reaction(s): Other Other reaction(s): Other (See Comments) elevated LFT'S, tolerates 1/2 dose of zetia and 1/2 dose of pravastatin   Flagyl [Metronidazole]     headaches   Nsaids Other (See Comments)    Pt is prone to bleeding   Pravastatin Sodium     Other reaction(s): Other (See Comments) elevated LFT'S, tolerates 1/2 dose of zetia and 1/2 dose of pravastatin   Statins Other (See Comments)    elevated LFT'S, tolerates 1/2 dose of zetia and 1/2 dose of pravastatin  PAST MEDICAL HISTORY Past Medical History:  Diagnosis Date   Anemia    Arthritis    Barrett's esophagus    Stage B   CAD (coronary artery disease)    a. s/p STEMI 7/14 >> LHC: LM 20, mLAD 30-40, small Dx 100 (L-L collats), OM1 90, OM2 30-40, mRCA 50-60, Lat/AL akinesis, EF 35-40% >> PCI: Promus DES to OM1;  b. Myoview 3/16: diaph atten, no ischemia, EF 59% (intermediate risk b/c BP drop 152>>137, but recovery BP 162 (tech error) >> med Rx continued  c. cath 05/30/17 s/p FFR guided PCI with DES to circumflex/first OM    CREST variant of scleroderma ([place])    Cystocele    Diverticulitis    Event monitor 04/2019   Event monitor 04/2019: Sinus rhythm; no atrial fibrillation/flutter; no bradycardic events; few PVCs and occasional ventricular  runs up to 9 beats   GERD (gastroesophageal reflux disease)    History of blood transfusion    hx of being Anti-K positive   History of Doppler ultrasound    a. Carotid US 2/12: bilateral ICA < 50%   Hyperlipidemia    Hypertension    Ischemic cardiomyopathy    a. EF 35-40% by LHC at time of MI;  b. Echo 7/14:  Mild focal basal septal hypertrophy, EF 50-55%, apical HK, grade 1 diastolic dysfunction, trivial AI    Ischemic colitis ([place])    Osteoporosis 12/2017   T score -2.6   Raynaud disease    Rectocele    mild   Past Surgical History:  Procedure Laterality Date   ABDOMINAL HYSTERECTOMY  1996   RSO for menorrhagia.   CORONARY ANGIOPLASTY WITH STENT PLACEMENT  06/21/2013   CORONARY STENT INTERVENTION N/A 05/30/2017   Procedure: Coronary Stent Intervention;  Surgeon: [name], MD;  Location: [facility];  Service: Cardiovascular;  Laterality: N/A;   FOOT SURGERY  2013   x2   INTRAVASCULAR PRESSURE WIRE/FFR STUDY N/A 05/30/2017   Procedure: Intravascular Pressure Wire/FFR Study;  Surgeon: [name], MD;  Location: [facility];  Service: Cardiovascular;  Laterality: N/A;   JOINT REPLACEMENT  9/10   rt total   LEFT HEART CATH AND CORONARY ANGIOGRAPHY N/A 05/30/2017   Procedure: Left Heart Cath and Coronary Angiography;  Surgeon: [name], MD;  Location: [facility];  Service: Cardiovascular;  Laterality: N/A;   nissen fundoplication  2500   ROTATOR CUFF REPAIR     TONSILLECTOMY  1950   TOTAL KNEE ARTHROPLASTY     right   TUBAL LIGATION  1980    FAMILY HISTORY Family History  Problem Relation Age of Onset   Cancer Mother        Pancreatic Cancer   Hepatitis C Mother    Heart disease Father        congestive heart failure   Heart attack Father    Panic disorder Daughter    Hypertension Daughter    Asthma Maternal Grandmother    Emphysema Maternal Grandmother    Cancer Maternal Grandfather        lung    Stroke Neg Hx     SOCIAL HISTORY Social History   Tobacco Use   Smoking status: Never Smoker   Smokeless tobacco: Never Used  Substance Use Topics   Alcohol use: No    Alcohol/week: 0.0 standard drinks   Drug use: No         OPHTHALMIC EXAM:  Base Eye Exam    Visual Acuity (  Snellen - Linear)      Right Left   Dist [place] 20/25 20/40 +   Dist ph [place]  20/25       Tonometry (Tonopen, 1:30 PM)      Right Left   Pressure 17 19       Pupils      Pupils Dark Light Shape React APD   Right PERRL 4 3 Round Brisk None   Left PERRL 4 3 Round Brisk None       Visual Fields (Counting fingers)      Left Right    Full Full       Neuro/Psych    Oriented x3: Yes   Mood/Affect: Normal       Dilation    Both eyes: 1.0% Mydriacyl, 2.5% Phenylephrine @ 1:30 PM        Slit Lamp and Fundus Exam    External Exam      Right Left   External Normal Normal       Slit Lamp Exam      Right Left   Lids/Lashes Normal Normal   Conjunctiva/Sclera White and quiet White and quiet   Cornea Clear Clear   Anterior Chamber Deep and quiet Deep and quiet   Iris Round and reactive Round and reactive   Lens Posterior chamber intraocular lens Posterior chamber intraocular lens   Anterior Vitreous Normal Normal       Fundus Exam      Right Left   Posterior Vitreous Normal Normal   Disc Normal Normal   C/D Ratio 0.3 0.3   Macula no macular thickening no macular thickening   Vessels Normal Normal   Periphery Normal Normal          IMAGING AND PROCEDURES  Imaging and Procedures for 03/20/20  OCT, Retina - OU - Both Eyes       Right Eye Central Foveal Thickness: 294. Findings include abnormal foveal contour, cystoid macular edema.   Left Eye Central Foveal Thickness: 287. Findings include abnormal foveal contour, cystoid macular edema.   Notes Hello telangiectasis in each eye is overall improved from initial evaluation 2018, however is slightly worse.  Patient has now  been off the oral EMA device for 1 year.  She is still considering starting the the CPAP machine as prescribed to her in the past                ASSESSMENT/PLAN:  Type 2 macular telangiectasis, left Macular telangiectasis (MAC-TEL), or parafoveal telangiectasis is a condition of "unknown" cause.  Findings in or near the macula (center of vision) consist of microaneurysms (leaking small capillaries), often with leakage of fluid which in the active phase can impact fine discriminatory vision, and in some cases trigger profound scarring in the macula, with severe permanent vision loss.  Standard treatment is observation and periodic examinations to monitor for treatable complications.   The cause  of this condition is "unknown".  However, the practice of Dr. Zadie Rhine has discovered an association with sleep apnea with its nightly periods of low oxygen in the blood stream (hypoxia), retained carbon dioxide (hypercapnia), associated with transient nocturnal hypertensive episodes.   More recently, some patients also been found to have advanced lung disease, whether asthma or COPD, with similar findings.  Dr. Zadie Rhine has been evaluating the association of sleep apnea, nightly hypoxia, and Macular telangiectasis for over 18 years.  Most patients are found to be noncompliant with sleep apnea therapy or testing in the past.  Resumption of CPAP or similar therapy is strongly recommended if ordered in the past.  Upon review of risk factors or findings positive for sleep apnea, more formal, extensive sleep laboratory or home testing, may be recommended.  Numerous patients, proven to have MAC-TEL, have improved or resolved their ey condition promptly, within weeks, of the use of nighttime oxygen supplementation or continuous positive airway pressure (CPAP).Macular telangiectasis (MAC-TEL), or parafoveal telangiectasis is a condition of "unknown" cause.  Findings in or near the macula (center of vision) consist of  microaneurysms (leaking small capillaries), often with leakage of fluid which in the active phase can impact fine discriminatory vision, and in some cases trigger profound scarring in the macula, with severe permanent vision loss.  Standard treatment is observation and periodic examinations to monitor for treatable complications.   The cause  of this condition is "unknown".  However, the practice of Dr. Zadie Rhine has discovered an association with sleep apnea with its nightly periods of low oxygen in the blood stream (hypoxia), retained carbon dioxide (hypercapnia), associated with transient nocturnal hypertensive episodes.   More recently, some patients also been found to have advanced lung disease, whether asthma or COPD, with similar findings.  Dr. Zadie Rhine has been evaluating the association of sleep apnea, nightly hypoxia, and Macular telangiectasis for over 18 years.  Most patients are found to be noncompliant with sleep apnea therapy or testing in the past.  Resumption of CPAP or similar therapy is strongly recommended if ordered in the past.  Upon review of risk factors or findings positive for sleep apnea, more formal, extensive sleep laboratory or home testing, may be recommended.  Numerous patients, proven to have MAC-TEL, have improved or resolved their ey condition promptly, within weeks, of the use of nighttime oxygen supplementation or continuous positive airway pressure (CPAP).      ICD-10-CM   1. Type 2 macular telangiectasis, right  H35.071 OCT, Retina - OU - Both Eyes  2. Cystoid macular edema of right eye  H35.351 OCT, Retina - OU - Both Eyes  3. Type 2 macular telangiectasis, left  H35.072 OCT, Retina - OU - Both Eyes  4. Obstructive sleep apnea, adult  G47.33     1.  2.  3.  Ophthalmic Meds Ordered this visit:  No orders of the defined types were placed in this encounter.      Return in about 6 months (around 09/19/2020) for DILATE OU, OCT.  There are no Patient Instructions  on file for this visit.   Explained the diagnoses, plan, and follow up with the patient and they expressed understanding.  Patient expressed understanding of the importance of proper follow up care.   Clent Demark Mcdaniel Ohms M.D. Diseases & Surgery of the Retina and Vitreous [facility] [place] 03/20/20     Abbreviations: M myopia (nearsighted); A astigmatism; H hyperopia (farsighted); P presbyopia; Mrx spectacle prescription;  CTL contact lenses; OD right eye; OS left eye; OU both eyes  XT exotropia; ET esotropia; PEK punctate epithelial keratitis; PEE punctate epithelial erosions; DES dry eye syndrome; MGD meibomian gland dysfunction; ATs artificial tears; PFAT's preservative free artificial tears; [place] nuclear sclerotic cataract; PSC posterior subcapsular cataract; ERM epi-retinal membrane; PVD posterior vitreous detachment; RD retinal detachment; DM diabetes mellitus; DR diabetic retinopathy; NPDR non-proliferative diabetic retinopathy; PDR proliferative diabetic retinopathy; CSME clinically significant macular edema; DME diabetic macular edema; dbh dot blot hemorrhages; CWS cotton wool spot; POAG primary open angle glaucoma; C/D cup-to-disc ratio; HVF humphrey visual field; GVF goldmann  visual field; OCT optical coherence tomography; IOP intraocular pressure; BRVO Branch retinal vein occlusion; CRVO central retinal vein occlusion; CRAO central retinal artery occlusion; BRAO branch retinal artery occlusion; RT retinal tear; SB scleral buckle; PPV pars plana vitrectomy; VH Vitreous hemorrhage; PRP panretinal laser photocoagulation; IVK intravitreal kenalog; VMT vitreomacular traction; MH Macular hole;  NVD neovascularization of the disc; NVE neovascularization elsewhere; AREDS age related eye disease study; ARMD age related macular degeneration; POAG primary open angle glaucoma; EBMD epithelial/anterior basement membrane dystrophy; ACIOL anterior chamber intraocular lens; IOL intraocular lens; PCIOL  posterior chamber intraocular lens; Phaco/IOL phacoemulsification with intraocular lens placement; [place] photorefractive keratectomy; LASIK laser assisted in situ keratomileusis; HTN hypertension; DM diabetes mellitus; COPD chronic obstructive pulmonary disease

## 2020-04-17 ENCOUNTER — Other Ambulatory Visit: Payer: Self-pay | Admitting: Physician Assistant

## 2020-04-18 ENCOUNTER — Other Ambulatory Visit: Payer: Self-pay

## 2020-04-18 ENCOUNTER — Emergency Department
Admission: EM | Admit: 2020-04-18 | Discharge: 2020-04-18 | Disposition: A | Discharge status: Home / Self Care | Payer: Medicare PPO | Source: Home / Self Care

## 2020-04-18 ENCOUNTER — Emergency Department (INDEPENDENT_AMBULATORY_CARE_PROVIDER_SITE_OTHER): Payer: Medicare PPO

## 2020-04-18 DIAGNOSIS — R05 Cough: Secondary | ICD-10-CM

## 2020-04-18 DIAGNOSIS — Z8719 Personal history of other diseases of the digestive system: Secondary | ICD-10-CM

## 2020-04-18 DIAGNOSIS — R0981 Nasal congestion: Secondary | ICD-10-CM

## 2020-04-18 DIAGNOSIS — J069 Acute upper respiratory infection, unspecified: Secondary | ICD-10-CM

## 2020-04-18 MED ORDER — FLUTICASONE PROPIONATE 50 MCG/ACT NA SUSP: 2.0000 | Freq: Every day | NASAL | 2 refills | Status: DC

## 2020-04-18 MED ORDER — BENZONATATE 100 MG PO CAPS: 100.0000 mg | ORAL_CAPSULE | Freq: Three times a day (TID) | ORAL | 0 refills | Status: DC

## 2020-04-18 MED ORDER — ALBUTEROL SULFATE HFA 108 (90 BASE) MCG/ACT IN AERS: 1.0000 | INHALATION_SPRAY | Freq: Four times a day (QID) | RESPIRATORY_TRACT | 0 refills | Status: DC | PRN

## 2020-04-18 NOTE — ED Provider Notes (Signed)
[name]    CSN: [id number] Arrival date & time: 04/18/20  1353      History   Chief Complaint Chief Complaint  Patient presents with  . Cough    HPI Kristine [name] is a 79 y.o. female.   HPI  Kristine [name] is a 79 y.o. female presenting to UC with c/o dry cough that started last night, worse with lying down, associated nasal drainage that was clear. Nasal drainage is better today but her chest still feels a little tight and occasional dry cough. She tried using her albuterol inhaler at home but states it is out of medication.  Denies fever, chills, n/v/d. No known sick contacts or recent travel. Denies chest pain or SOB. She has not received the Covid-19 vaccine as she does not trust it yet.   Past Medical History:  Diagnosis Date  . Anemia   . Arthritis   . Barrett's esophagus    Stage B  . CAD (coronary artery disease)    a. s/p STEMI 7/14 >> LHC: LM 20, mLAD 30-40, small Dx 100 (L-L collats), OM1 90, OM2 30-40, mRCA 50-60, Lat/AL akinesis, EF 35-40% >> PCI: Promus DES to OM1;  b. Myoview 3/16: diaph atten, no ischemia, EF 59% (intermediate risk b/c BP drop 152>>137, but recovery BP 162 (tech error) >> med Rx continued  c. cath 05/30/17 s/p FFR guided PCI with DES to circumflex/first OM   . CREST variant of scleroderma ([place])   . Cystocele   . Diverticulitis   . Event monitor 04/2019   Event monitor 04/2019: Sinus rhythm; no atrial fibrillation/flutter; no bradycardic events; few PVCs and occasional ventricular runs up to 9 beats  . GERD (gastroesophageal reflux disease)   . History of blood transfusion    hx of being Anti-K positive  . History of Doppler ultrasound    a. Carotid US 2/12: bilateral ICA < 50%  . Hyperlipidemia   . Hypertension   . Ischemic cardiomyopathy    a. EF 35-40% by LHC at time of MI;  b. Echo 7/14:  Mild focal basal septal hypertrophy, EF 50-55%, apical HK, grade 1 diastolic dysfunction, trivial AI   . Ischemic colitis ([place])   .  Osteoporosis 12/2017   T score -2.6  . Raynaud disease   . Rectocele    mild    Patient Active Problem List   Diagnosis Date Noted  . Type 2 macular telangiectasis, right 03/20/2020  . Cystoid macular edema of right eye 03/20/2020  . Type 2 macular telangiectasis, left 03/20/2020  . Obstructive sleep apnea, adult 03/20/2020  . Psychophysiological insomnia 09/24/2017  . Abnormal nuclear cardiac imaging test 05/30/2017  . Abnormal stress echo   . Solar lentigo 11/08/2016  . Acute blood loss anemia 10/10/2015  . Colitis 10/08/2015  . CAD (coronary artery disease), native coronary artery 06/27/2013  . Hyperlipidemia 06/27/2013  . Precordial pain 06/26/2013  . History of ST elevation myocardial infarction (STEMI) 06/24/2013  . Hypertension   . GERD (gastroesophageal reflux disease)   . Barrett's esophagus   . Cystocele   . Rectocele     Past Surgical History:  Procedure Laterality Date  . ABDOMINAL HYSTERECTOMY  1996   RSO for menorrhagia.  . CORONARY ANGIOPLASTY WITH STENT PLACEMENT  06/21/2013  . CORONARY STENT INTERVENTION N/A 05/30/2017   Procedure: Coronary Stent Intervention;  Surgeon: [name], MD;  Location: [facility];  Service: Cardiovascular;  Laterality: N/A;  . FOOT SURGERY  2013  x2  . INTRAVASCULAR PRESSURE WIRE/FFR STUDY N/A 05/30/2017   Procedure: Intravascular Pressure Wire/FFR Study;  Surgeon: [name], MD;  Location: [facility];  Service: Cardiovascular;  Laterality: N/A;  . JOINT REPLACEMENT  9/10   rt total  . LEFT HEART CATH AND CORONARY ANGIOGRAPHY N/A 05/30/2017   Procedure: Left Heart Cath and Coronary Angiography;  Surgeon: [name], MD;  Location: [facility];  Service: Cardiovascular;  Laterality: N/A;  . nissen fundoplication  123456  . ROTATOR CUFF REPAIR    . TONSILLECTOMY  1950  . TOTAL KNEE ARTHROPLASTY     right  . TUBAL LIGATION  1980    OB History    Gravida  1   Para  1   Term      Preterm       AB      Living  1     SAB      TAB      Ectopic      Multiple      Live Births               Home Medications    Prior to Admission medications   Medication Sig Start Date End Date Taking? Authorizing Provider  acetaminophen (TYLENOL) 500 MG tablet Take 1,000 mg by mouth every 6 (six) hours as needed for headache.    [provider]  albuterol (VENTOLIN HFA) 108 (90 Base) MCG/ACT inhaler Inhale 1-2 puffs into the lungs every 6 (six) hours as needed for wheezing or shortness of breath. 04/18/20   Noe Gens, PA-C  ALPRAZolam Duanne Moron) 0.25 MG tablet Take 1 tablet (0.25 mg total) by mouth daily as needed for anxiety. 09/17/17   Wardell Honour, MD  aspirin EC 81 MG EC tablet Take 1 tablet (81 mg total) by mouth daily. 06/24/13   Barrett, Evelene Croon, PA-C  benzonatate (TESSALON) 100 MG capsule Take 1-2 capsules (100-200 mg total) by mouth every 8 (eight) hours. 04/18/20   Noe Gens, PA-C  cetirizine (ZYRTEC) 5 MG tablet Take 1 tablet (5 mg total) by mouth daily. 09/06/19   Noe Gens, PA-C  ezetimibe (ZETIA) 10 MG tablet TAKE 1/2 TABLET EVERY DAY 06/02/19   [name], MD  famotidine (PEPCID) 40 MG tablet Take 1 tablet by mouth at bedtime. 12/21/18   [provider]  ferrous sulfate 325 (65 FE) MG EC tablet Take 325 mg by mouth 2 (two) times a day.    [provider]  fluticasone (FLONASE) 50 MCG/ACT nasal spray Place 2 sprays into both nostrils daily. 04/18/20   Noe Gens, PA-C  metoprolol succinate (TOPROL-XL) 25 MG 24 hr tablet TAKE 1/2 TABLET EVERY DAY 09/17/19   [name], MD  nitroGLYCERIN (NITROSTAT) 0.4 MG SL tablet Place 1 tablet (0.4 mg total) under the tongue every 5 (five) minutes x 3 doses as needed for chest pain. 01/04/19   [name], MD  pantoprazole (PROTONIX) 40 MG tablet Take 40 mg by mouth daily.    [provider]  pravastatin (PRAVACHOL) 10 MG tablet TAKE 1 TABLET EVERY EVENING 04/18/20   [name], MD  traMADol (ULTRAM) 50 MG tablet Take 1 tablet (50 mg total) by mouth every 8 (eight) hours as needed for moderate pain or severe pain. 01/29/19   Noe Gens, PA-C    Family History Family History  Problem Relation Age of Onset  . Cancer Mother  Pancreatic Cancer  . Hepatitis C Mother   . Heart disease Father        congestive heart failure  . Heart attack Father   . Panic disorder Daughter   . Hypertension Daughter   . Asthma Maternal Grandmother   . Emphysema Maternal Grandmother   . Cancer Maternal Grandfather        lung  . Stroke Neg Hx     Social History Social History   Tobacco Use  . Smoking status: Never Smoker  . Smokeless tobacco: Never Used  Substance Use Topics  . Alcohol use: No    Alcohol/week: 0.0 standard drinks  . Drug use: No     Allergies   Aspirin, Pravastatin, Nsaids, Pravastatin sodium, and Statins   Review of Systems Review of Systems  Constitutional: Negative for chills and fever.  HENT: Positive for congestion and rhinorrhea. Negative for ear pain, sore throat, trouble swallowing and voice change.   Respiratory: Positive for cough. Negative for shortness of breath.   Cardiovascular: Negative for chest pain and palpitations.  Gastrointestinal: Negative for abdominal pain, diarrhea, nausea and vomiting.  Musculoskeletal: Negative for arthralgias, back pain and myalgias.  Skin: Negative for rash.  Neurological: Negative for dizziness, light-headedness and headaches.  All other systems reviewed and are negative.    Physical Exam Triage Vital Signs ED Triage Vitals  Enc Vitals Group     BP 04/18/20 1409 135/78     Pulse Rate 04/18/20 1409 98     Resp 04/18/20 1409 16     Temp 04/18/20 1409 99.7 F (37.6 C)     Temp Source 04/18/20 1409 Oral     SpO2 04/18/20 1409 99 %     Weight --      Height --      Head Circumference --      Peak Flow --      Pain Score 04/18/20 1406 0     Pain Loc --      Pain Edu? --        Excl. in [place]? --    No data found.  Updated Vital Signs BP 135/78 (BP Location: Right Arm)   Pulse 98   Temp 99.7 F (37.6 C) (Oral)   Resp 16   LMP 11/25/1994   SpO2 99%   Visual Acuity Right Eye Distance:   Left Eye Distance:   Bilateral Distance:    Right Eye Near:   Left Eye Near:    Bilateral Near:     Physical Exam Vitals and nursing note reviewed.  Constitutional:      General: She is not in acute distress.    Appearance: Normal appearance. She is well-developed. She is not ill-appearing, toxic-appearing or diaphoretic.  HENT:     Head: Normocephalic and atraumatic.     Right Ear: Tympanic membrane and ear canal normal.     Left Ear: Tympanic membrane and ear canal normal.     Nose: Nose normal.     Mouth/Throat:     Mouth: Mucous membranes are moist.     Pharynx: Oropharynx is clear.  Cardiovascular:     Rate and Rhythm: Normal rate and regular rhythm.  Pulmonary:     Effort: Pulmonary effort is normal. No respiratory distress.     Breath sounds: Normal breath sounds. No stridor. No wheezing, rhonchi or rales.  Musculoskeletal:        General: Normal range of motion.     Cervical back: Normal range of motion.  Skin:    General: Skin is warm and dry.  Neurological:     Mental Status: She is alert and oriented to person, place, and time.  Psychiatric:        Behavior: Behavior normal.      UC Treatments / Results  Labs (all labs ordered are listed, but only abnormal results are displayed) Labs Reviewed - No data to display  EKG   Radiology DG Chest 2 View  Result Date: 04/18/2020 CLINICAL DATA:  Dry cough and nasal congestion EXAM: CHEST - 2 VIEW COMPARISON:  09/06/2019 FINDINGS: Lungs are clear. No pleural effusion or pneumothorax. Normal heart size. Hiatal hernia with distended, air-filled esophagus. IMPRESSION: Clear lungs.  Hiatal hernia with distended, air-filled esophagus. Electronically Signed   By: Macy Mis M.D.   On: 04/18/2020  14:59    Procedures Procedures (including critical care time)  Medications Ordered in UC Medications - No data to display  Initial Impression / Assessment and Plan / UC Course  I have reviewed the triage vital signs and the nursing notes.  Pertinent labs & imaging results that were available during my care of the patient were reviewed by me and considered in my medical decision making (see chart for details).     Pt appears well, NAD Vitals: WNL Discussed imaging with pt and Dr. Lanny Cramp, pt to f/u with PCP and her GI specialist at [facility] for ongoing management of hiatal hernia.  Will treat current cough and congestion as viral illness No indication for antibiotics at this time. Discussed symptoms that warrant emergent care in the ED. AVS provided.  Final Clinical Impressions(s) / UC Diagnoses   Final diagnoses:  Viral URI with cough  H/O hiatal hernia     Discharge Instructions      There is no evidence of a bacterial infection today.  Antibiotics would not help your cough, however, a nasal spray (Flonase), and cough pill (benzonatate), has been sent to the pharmacy to help with your nasal congestion and cough.  Your hital hernia was also noted on your chest x-ray today. This can increase your risk of acid reflux. It is recommended you follow up with your family doctor or GI specialist so they can continue to treat and monitor as needed.  Follow up in 1 week with your family doctor if your cough is not improving, follow up sooner if you develop fever, nausea, vomiting.  Call 911 or go to the hospital if you develop, chest pain, trouble breathing or other new concerning symptoms develop.     ED Prescriptions    Medication Sig Dispense Auth. Provider   benzonatate (TESSALON) 100 MG capsule Take 1-2 capsules (100-200 mg total) by mouth every 8 (eight) hours. 21 capsule Gerarda Fraction, Magaly Pollina O, PA-C   fluticasone (FLONASE) 50 MCG/ACT nasal spray Place 2 sprays into both  nostrils daily. 16 g Devron Cohick O, PA-C   albuterol (VENTOLIN HFA) 108 (90 Base) MCG/ACT inhaler Inhale 1-2 puffs into the lungs every 6 (six) hours as needed for wheezing or shortness of breath. 8 g Noe Gens, PA-C     PDMP not reviewed this encounter.   Noe Gens, [place] 04/18/20 [phone]

## 2020-04-18 NOTE — ED Triage Notes (Signed)
Patient presents to Urgent Care with complaints of dry cough and nasal drainage since yesterday. Patient reports her nose is not running today but she feels like it is stuffy in her head. Pt denies fevers, has not been vaccinated for covid (says she does not trust it).

## 2020-04-18 NOTE — Discharge Instructions (Signed)
°  There is no evidence of a bacterial infection today.  Antibiotics would not help your cough, however, a nasal spray (Flonase), and cough pill (benzonatate), has been sent to the pharmacy to help with your nasal congestion and cough.  Your hital hernia was also noted on your chest x-ray today. This can increase your risk of acid reflux. It is recommended you follow up with your family doctor or GI specialist so they can continue to treat and monitor as needed.  Follow up in 1 week with your family doctor if your cough is not improving, follow up sooner if you develop fever, nausea, vomiting.  Call 911 or go to the hospital if you develop, chest pain, trouble breathing or other new concerning symptoms develop.

## 2020-04-25 ENCOUNTER — Ambulatory Visit: Payer: Medicare PPO | Admitting: Physician Assistant

## 2020-05-03 ENCOUNTER — Telehealth: Payer: Self-pay | Admitting: Internal Medicine

## 2020-05-03 ENCOUNTER — Telehealth: Payer: Self-pay | Admitting: Cardiovascular Disease

## 2020-05-03 NOTE — Telephone Encounter (Signed)
Cardiology [facility] Note  Returned call from patient. She just got over a case of COVID. For the past 2 days she has has intermittent pain in her L chest. The pain is a sharp, stabbing pain located under her left breast. It is unrelated to exertion. It has happened 4 or 5 times now. She has taken SLN every time and this has resulted in resolution of her symptoms. This does not at all feel like the MI that she had in the past (which was more typical central chest pressure). She has no associated SOB, syncope, presyncope. The pain is non-radiating. No nausea/vomiting. Currently she is pain free. She she has these episodes they last for a few minutes until she takes nitroglycerin.   I recommended that the patient should be evaluated ASAP for these symptoms. She states she would rather call her Dr's office today than come into the ED now. I advised that she come to the ED. She states she will come in if her symptoms recur. Otherwise she will call her Dr today and try to be seen for a same day appointment. Other red flag symptoms reviewed with the patient.   Patient's cardiologist cc'd on this note.   Marcie Mowers, MD Cardiology Fellow, PGY-7

## 2020-05-03 NOTE — Telephone Encounter (Signed)
Spoke with management and Lurena Joiner. Given the patient's continued (although improving) diarrhea and congestion, Lurena Joiner will have virtual visit at this time. If needed, the patient can be brought in for testing next week.

## 2020-05-03 NOTE — Telephone Encounter (Signed)
Kristine [name] tested positive for COVID 5/29 (she had symptoms of congestion, "horrible diarrhea," and fatigue for about 1 week before testing).  Her congestion and diarrhea are improving but she is still very fatigued. She has been out of quarantine for 2 days and has no desire to get vaccinated.  This morning at about 0330, she had a "jabbing" in the L side of her L breast that was bad enough to wake her. She states she spoke to the on-call provider and was told to call the office for an appointment today.  She reports the "jabbing" is intermittent and usually occurs when she's sitting. She also describes the pain like a "short circuit" in her L breast.  The pain is relieved with NTG.  She denies chest pressure and states it feels much different from when she had her stents placed.  She agrees to virtual visit tomorrow and consent obtained. She will have VS ready for the call.  ER precautions reviewed.    Patient Consent for Virtual Visit         Kristine [name] has provided verbal consent on 05/03/2020 for a virtual visit (video or telephone).   CONSENT FOR VIRTUAL VISIT FOR:  Kristine [name]  By participating in this virtual visit I agree to the following:  I hereby voluntarily request, consent and authorize [facility] and its employed or contracted physicians, physician assistants, nurse practitioners or other licensed health care professionals (the Practitioner), to provide me with telemedicine health care services (the "Services") as deemed necessary by the treating Practitioner. I acknowledge and consent to receive the Services by the Practitioner via telemedicine. I understand that the telemedicine visit will involve communicating with the Practitioner through live audiovisual communication technology and the disclosure of certain medical information by electronic transmission. I acknowledge that I have been given the opportunity to request an in-person assessment or other available alternative  prior to the telemedicine visit and am voluntarily participating in the telemedicine visit.  I understand that I have the right to withhold or withdraw my consent to the use of telemedicine in the course of my care at any time, without affecting my right to future care or treatment, and that the Practitioner or I may terminate the telemedicine visit at any time. I understand that I have the right to inspect all information obtained and/or recorded in the course of the telemedicine visit and may receive copies of available information for a reasonable fee.  I understand that some of the potential risks of receiving the Services via telemedicine include:  Marland Kitchen Delay or interruption in medical evaluation due to technological equipment failure or disruption; . Information transmitted may not be sufficient (e.g. poor resolution of images) to allow for appropriate medical decision making by the Practitioner; and/or  . In rare instances, security protocols could fail, causing a breach of personal health information.  Furthermore, I acknowledge that it is my responsibility to provide information about my medical history, conditions and care that is complete and accurate to the best of my ability. I acknowledge that Practitioner's advice, recommendations, and/or decision may be based on factors not within their control, such as incomplete or inaccurate data provided by me or distortions of diagnostic images or specimens that may result from electronic transmissions. I understand that the practice of medicine is not an exact science and that Practitioner makes no warranties or guarantees regarding treatment outcomes. I acknowledge that a copy of this consent can be made available to me via my patient  portal [facility] MyChart), or I can request a printed copy by calling the office of [facility].    I understand that my insurance will be billed for this visit.   I have read or had this consent read to me. . I  understand the contents of this consent, which adequately explains the benefits and risks of the Services being provided via telemedicine.  . I have been provided ample opportunity to ask questions regarding this consent and the Services and have had my questions answered to my satisfaction. . I give my informed consent for the services to be provided through the use of telemedicine in my medical care

## 2020-05-03 NOTE — Telephone Encounter (Signed)
This patient is really not appropriate for virtual- she needs an office visit with any APP or DOD- she needs an EKG.  [name] PA-C 05/03/2020 1:56 PM

## 2020-05-03 NOTE — Telephone Encounter (Signed)
Pt c/o of Chest Pain: STAT if CP now or developed within 24 hours  1. Are you having CP right now? No- jabbing pain under left breast, woke her up at 3:30 this morning and have several episodes yesterday- talked to our  Person on call this morning, told her to call today and get an appt  2. Are you experiencing any other symptoms (ex. SOB, nausea, vomiting, sweating)? Real tired and do not feel right- pt had COVID-just came out of quarantine 2 days ago  3. How long have you been experiencing CP? Started yesterday  4. Is your CP continuous or coming and going?  Comes and goes  5. Have you taken Nitroglycerin?  yes ?

## 2020-05-04 ENCOUNTER — Telehealth (INDEPENDENT_AMBULATORY_CARE_PROVIDER_SITE_OTHER): Payer: Medicare PPO | Admitting: Cardiology

## 2020-05-04 ENCOUNTER — Telehealth: Payer: Self-pay | Admitting: Cardiovascular Disease

## 2020-05-04 ENCOUNTER — Encounter: Payer: Self-pay | Admitting: Cardiology

## 2020-05-04 VITALS — BP 117/59 | HR 77 | Ht 61.0 in | Wt 117.6 lb

## 2020-05-04 DIAGNOSIS — I10 Essential (primary) hypertension: Secondary | ICD-10-CM

## 2020-05-04 DIAGNOSIS — I2510 Atherosclerotic heart disease of native coronary artery without angina pectoris: Secondary | ICD-10-CM

## 2020-05-04 DIAGNOSIS — U071 COVID-19: Secondary | ICD-10-CM | POA: Insufficient documentation

## 2020-05-04 DIAGNOSIS — R079 Chest pain, unspecified: Secondary | ICD-10-CM | POA: Diagnosis not present

## 2020-05-04 MED ORDER — EZETIMIBE 10 MG PO TABS: ORAL_TABLET | ORAL | 3 refills | Status: DC

## 2020-05-04 NOTE — Telephone Encounter (Signed)
Pt seen today by [name], PAC. Will forward to primary nurse as well.

## 2020-05-04 NOTE — Progress Notes (Signed)
Virtual Visit via Telephone Note   This visit type was conducted due to national recommendations for restrictions regarding the COVID-19 Pandemic (e.g. social distancing) in an effort to limit this patient's exposure and mitigate transmission in our community.  Due to her co-morbid illnesses, this patient is at least at moderate risk for complications without adequate follow up.  This format is felt to be most appropriate for this patient at this time.  The patient did not have access to video technology/had technical difficulties with video requiring transitioning to audio format only (telephone).  All issues noted in this document were discussed and addressed.  No physical exam could be performed with this format.  Please refer to the patient's chart for her  consent to telehealth for [facility].   The patient was identified using 2 identifiers.  Date:  05/04/2020   ID:  [name], DOB August 15, 1941, MRN [id number]  Patient Location: Home Provider Location: Home  PCP:  System, Provider Not In  Cardiologist:  [name], MD  Electrophysiologist:  None   Evaluation Performed:  Follow-Up Visit  Chief Complaint:  Chest pain  History of Present Illness:    Kristine [name] is a pleasant 79 y.o. female with a history of coronary disease.  In 2014 she had an MI treated with an OM PCI and stent.  In 2018 she had OM stenosis proximal to the prior stent and underwent another intervention and stent placement.  She is done well from a cardiac standpoint since then.  Other medical issues include dyslipidemia with a history of intolerance to higher dose statin therapy.  Hypertension with a history of hypotension on ARB which was discontinued.  She has CR EST syndrome.  She has anemia and is on iron therapy.  She saw Dr. Burt Knack in December 2020 and was doing well.  The patient had a positive COVID test in November 2020 although she tells me this was a "false positive".  Its not clear to me how that  was determined.  In May 2021 she had cough as well as diarrhea and tested positive for COVID.  She has declined the vaccine saying she does not trust it.  Since her recent bout with CO VID she has had weakness.  She called the answering service at 3 in the morning a couple nights ago complaining of left-sided chest pain.  The patient describes sharp jabbing left lateral chest pain.  She says she took a nitroglycerin for this and it improved her symptoms although she told me the symptoms only lasted for seconds.  She had this on 2 occasions.  The cardiology fellow on-call suggested she come to the emergency room for further evaluation.  She declined.  She was given this virtual visit today for follow-up, I initially suggested she come to the office but because of continued GI symptoms and diarrhea she was made a virtual visit instead.  The patient has not had any further episodes of chest pain.  She denies any chest pain or shortness of breath when she lays down, she does sleep in a recliner chronically.  She does have fatigue with exertion but no chest pressure.  She denies any hemoptysis.  She denies any pleuritic component to her chest pain.  She was contacted yesterday by the home health service and told they were going to do an EKG but she refused at that time, not understanding the indications.  The patient does have symptoms concerning for COVID-19 infection (fever, chills, cough, or new  shortness of breath)- persistent weakness and diarrhea post COVID.    Past Medical History:  Diagnosis Date  . Anemia   . Arthritis   . Barrett's esophagus    Stage B  . CAD (coronary artery disease)    a. s/p STEMI 7/14 >> LHC: LM 20, mLAD 30-40, small Dx 100 (L-L collats), OM1 90, OM2 30-40, mRCA 50-60, Lat/AL akinesis, EF 35-40% >> PCI: Promus DES to OM1;  b. Myoview 3/16: diaph atten, no ischemia, EF 59% (intermediate risk b/c BP drop 152>>137, but recovery BP 162 (tech error) >> med Rx continued  c. cath  05/30/17 s/p FFR guided PCI with DES to circumflex/first OM   . CREST variant of scleroderma ([place])   . Cystocele   . Diverticulitis   . Event monitor 04/2019   Event monitor 04/2019: Sinus rhythm; no atrial fibrillation/flutter; no bradycardic events; few PVCs and occasional ventricular runs up to 9 beats  . GERD (gastroesophageal reflux disease)   . History of blood transfusion    hx of being Anti-K positive  . History of Doppler ultrasound    a. Carotid US 2/12: bilateral ICA < 50%  . Hyperlipidemia   . Hypertension   . Ischemic cardiomyopathy    a. EF 35-40% by LHC at time of MI;  b. Echo 7/14:  Mild focal basal septal hypertrophy, EF 50-55%, apical HK, grade 1 diastolic dysfunction, trivial AI   . Ischemic colitis ([place])   . Osteoporosis 12/2017   T score -2.6  . Raynaud disease   . Rectocele    mild   Past Surgical History:  Procedure Laterality Date  . ABDOMINAL HYSTERECTOMY  1996   RSO for menorrhagia.  . CORONARY ANGIOPLASTY WITH STENT PLACEMENT  06/21/2013  . CORONARY STENT INTERVENTION N/A 05/30/2017   Procedure: Coronary Stent Intervention;  Surgeon: [name], MD;  Location: [place] [facility];  Service: Cardiovascular;  Laterality: N/A;  . FOOT SURGERY  2013   x2  . INTRAVASCULAR PRESSURE WIRE/FFR STUDY N/A 05/30/2017   Procedure: Intravascular Pressure Wire/FFR Study;  Surgeon: [name], MD;  Location: [place] [facility];  Service: Cardiovascular;  Laterality: N/A;  . JOINT REPLACEMENT  9/10   rt total  . LEFT HEART CATH AND CORONARY ANGIOGRAPHY N/A 05/30/2017   Procedure: Left Heart Cath and Coronary Angiography;  Surgeon: [name], MD;  Location: [place] [facility];  Service: Cardiovascular;  Laterality: N/A;  . nissen fundoplication  6789  . ROTATOR CUFF REPAIR    . TONSILLECTOMY  1950  . TOTAL KNEE ARTHROPLASTY     right  . TUBAL LIGATION  1980     Current Meds  Medication Sig  . acetaminophen (TYLENOL) 500 MG tablet Take 1,000 mg by mouth  every 6 (six) hours as needed for headache.  . albuterol (VENTOLIN HFA) 108 (90 Base) MCG/ACT inhaler Inhale 1-2 puffs into the lungs every 6 (six) hours as needed for wheezing or shortness of breath.  . ALPRAZolam (XANAX) 0.25 MG tablet Take 1 tablet (0.25 mg total) by mouth daily as needed for anxiety.  Marland Kitchen aspirin EC 81 MG EC tablet Take 1 tablet (81 mg total) by mouth daily.  . benzonatate (TESSALON) 100 MG capsule Take 1-2 capsules (100-200 mg total) by mouth every 8 (eight) hours.  Marland Kitchen ezetimibe (ZETIA) 10 MG tablet TAKE 1/2 TABLET EVERY DAY  . famotidine (PEPCID) 40 MG tablet Take 1 tablet by mouth at bedtime.  . ferrous sulfate 325 (65 FE) MG EC tablet  Take 325 mg by mouth 2 (two) times a day.  . fluticasone (FLONASE) 50 MCG/ACT nasal spray Place 2 sprays into both nostrils daily.  . metoprolol succinate (TOPROL-XL) 25 MG 24 hr tablet TAKE 1/2 TABLET EVERY DAY  . nitroGLYCERIN (NITROSTAT) 0.4 MG SL tablet Place 1 tablet (0.4 mg total) under the tongue every 5 (five) minutes x 3 doses as needed for chest pain.  . pantoprazole (PROTONIX) 40 MG tablet Take 40 mg by mouth daily.  . pravastatin (PRAVACHOL) 10 MG tablet TAKE 1 TABLET EVERY EVENING  . [DISCONTINUED] traMADol (ULTRAM) 50 MG tablet Take 1 tablet (50 mg total) by mouth every 8 (eight) hours as needed for moderate pain or severe pain.     Allergies:   Aspirin, Pravastatin, Nsaids, Pravastatin sodium, and Statins   Social History   Tobacco Use  . Smoking status: Never Smoker  . Smokeless tobacco: Never Used  Vaping Use  . Vaping Use: Never used  Substance Use Topics  . Alcohol use: No    Alcohol/week: 0.0 standard drinks  . Drug use: No     Family Hx: The patient's family history includes Asthma in her maternal grandmother; Cancer in her maternal grandfather and mother; Emphysema in her maternal grandmother; Heart attack in her father; Heart disease in her father; Hepatitis C in her mother; Hypertension in her daughter; Panic  disorder in her daughter. There is no history of Stroke.  ROS:   Please see the history of present illness.    All other systems reviewed and are negative.   Prior CV studies:   The following studies were reviewed today: Cath/ PCI 2018-   Labs/Other Tests and Data Reviewed:    EKG:  An ECG dated 05/01/2018 was personally reviewed today and demonstrated:  NSR-HR 88, incomplete RBBB  Recent Labs: No results found for requested labs within last 8760 hours.   Recent Lipid Panel Lab Results  Component Value Date/Time   CHOL 148 04/28/2017 10:03 AM   CHOL 153 07/29/2014 09:26 AM   TRIG 126 04/28/2017 10:03 AM   TRIG 112 07/29/2014 09:26 AM   HDL 45 04/28/2017 10:03 AM   HDL 49 07/29/2014 09:26 AM   CHOLHDL 3.3 04/28/2017 10:03 AM   CHOLHDL 2.6 11/23/2015 09:12 AM   LDLCALC 78 04/28/2017 10:03 AM   LDLCALC 82 07/29/2014 09:26 AM    Wt Readings from Last 3 Encounters:  05/04/20 117 lb 9.6 oz (53.3 kg)  02/22/20 124 lb (56.2 kg)  11/05/19 126 lb (57.2 kg)     Objective:    Vital Signs:  BP (!) 117/59   Pulse 77   Ht 5\' 1"  (1.549 m)   Wt 117 lb 9.6 oz (53.3 kg)   LMP 11/25/1994   BMI 22.22 kg/m    VITAL SIGNS:  reviewed  ASSESSMENT & PLAN:    Chest pain- I explained our concerns about her symptoms, especially post COVID.  I told her her symptoms did not sound consistent with angina.  Other concerns would be pulmonary embolism or pericarditis.  She gives no classic symptoms for these conditions either.  I suggested she allow the home health nurse to go ahead with the EKG today.  I also told her I would arrange for her to come to the office next week.  I encouraged her to go to the nearest medical facility if she has recurrent chest pain, shortness of breath, or hemoptysis.  CAD- PCI OM1 in 2014, PCI OM1 (proximal to stent) in 2018.  residula 30% LAD, 40% RCA then.   HLD- On low dose statin and Zetia  HTN- Taken off ARB in the past secondary to orthostatic  hypotension  CREST- Per PCP  COVID- "false positive"  (?) Nov 2020, positive test 04/22/2020 with symptoms of diarrhea and cough.  Despite this she is not going to get the vaccine because she doesn't trust it.   Plan: I encouraged her to have the [facility] do an EKG as they recommended.  She will need an office visit next week.  She knows to go to the ED if she has SOB or recurrent chest pain.   COVID-19 Education: The signs and symptoms of COVID-19 were discussed with the patient and how to seek care for testing (follow up with PCP or arrange E-visit).  The importance of social distancing was discussed today.  Time:   Today, I have spent 25 minutes with the patient with telehealth technology discussing the above problems.     Medication Adjustments/Labs and Tests Ordered: Current medicines are reviewed at length with the patient today.  Concerns regarding medicines are outlined above.   Tests Ordered: No orders of the defined types were placed in this encounter.   Medication Changes: No orders of the defined types were placed in this encounter.   Follow Up:  In Person next week with Dr Burt Knack or an APP at [facility], Kristine [name], [place]  05/04/2020 10:08 AM    [place]

## 2020-05-04 NOTE — Telephone Encounter (Signed)
Follow Up:   Pt said she was returning a call , had no idea who called her.l

## 2020-05-04 NOTE — Telephone Encounter (Signed)
Spoke with pt and answered questions and cleared up any confusion. Pt very appreciative for return call ./cy

## 2020-05-04 NOTE — Patient Instructions (Signed)
Medication Instructions:  Continue current medications  *If you need a refill on your cardiac medications before your next appointment, please call your pharmacy*   Lab Work: None Ordered   Testing/Procedures: None Ordered   Follow-Up: At [facility], you and your health needs are our priority.  As part of our continuing mission to provide you with exceptional heart care, we have created designated Provider Care Teams.  These Care Teams include your primary Cardiologist (physician) and Advanced Practice Providers (APPs -  Physician Assistants and Nurse Practitioners) who all work together to provide you with the care you need, when you need it.  We recommend signing up for the patient portal called "MyChart".  Sign up information is provided on this After Visit Summary.  MyChart is used to connect with patients for Virtual Visits (Telemedicine).  Patients are able to view lab/test results, encounter notes, upcoming appointments, etc.  Non-urgent messages can be sent to your provider as well.   To learn more about what you can do with MyChart, go to [email or web address].    Your next appointment:   Wednesday June 16th @ 3:15 pm  The format for your next appointment:   In Person  Provider:   Fabian Sharp, [place]

## 2020-05-10 ENCOUNTER — Telehealth: Payer: Self-pay | Admitting: Physician Assistant

## 2020-05-10 ENCOUNTER — Ambulatory Visit: Payer: Medicare PPO | Admitting: Physician Assistant

## 2020-05-10 DIAGNOSIS — R079 Chest pain, unspecified: Secondary | ICD-10-CM

## 2020-05-10 NOTE — Addendum Note (Signed)
Addended by: Therisa Doyne on: 05/10/2020 11:01 AM   Modules accepted: Orders

## 2020-05-10 NOTE — Telephone Encounter (Signed)
Referral placed for home health EKG. Spoke with Joen Laura with [facility] to make her aware and ask if EKG can be done today or tomorrow and if they can accomodate for a female nurse because of the patient's concerns. She stated she will call me back to let me know what they can figure out for the pt.

## 2020-05-10 NOTE — Progress Notes (Deleted)
Cardiology Office Note:    Date:  05/10/2020   ID:  Kristine [name], DOB Nov 02, 1941, MRN [id number]  PCP:  System, Provider Not In  Cardiologist:  [name], MD   Referring MD: No ref. provider found   No chief complaint on file. ***  History of Present Illness:    Kristine [name] is a 79 y.o. female with a hx of CAD and MI in 2014 treated with stent to OM. She underwent repeat LHC following abnormal stress echo that showed 70% stenosis just prior to stent placed in OM, treated with additional stent placement. She also has a history of HLD intolerant to statins, HTN and hypotension on ARB, CREST variant scleroderma, GERD, anemia on iron, and ischemic cardiomyopathy with EF 35-40% at the time of her MI, but normal LV function in 2018. She had positive COVID-19 test 09/2019, but claims this was a "false positive."  She has since declined vaccination stating she doesn't trust it. She was seen in [facility] 04/18/20 for cough which was treated as a viral illness with albuterol.   She tested positive for COVID-19 03/2020 in the setting of cough and diarrhea. She called the on-call fellow overnight on 05/03/20 with chest pain improved with nitro administration. ER visit was advised but she refused. She was seen virtually with Kristine [name] on 05/04/20, who recommended an in-office visit to evaluate chest pain, but she refused. She was also contacted by home health services 05/03/20 to obtain EKG, but she refused this as well. Kristine [name] did not suspect an ACS process for her chest pain, PE or pericarditis.       Chest pain    COVID-19 infection - diagnosed on 04/22/20    HLD - continue low dose statin and zetia    Hypertension - ARB D/C'ed for orthostatic hypotension   Ischemic cardiomyopathy -          Past Medical History:  Diagnosis Date  . Anemia   . Arthritis   . Barrett's esophagus    Stage B  . CAD (coronary artery disease)    a. s/p STEMI 7/14 >> LHC: LM 20, mLAD 30-40,  small Dx 100 (L-L collats), OM1 90, OM2 30-40, mRCA 50-60, Lat/AL akinesis, EF 35-40% >> PCI: Promus DES to OM1;  b. Myoview 3/16: diaph atten, no ischemia, EF 59% (intermediate risk b/c BP drop 152>>137, but recovery BP 162 (tech error) >> med Rx continued  c. cath 05/30/17 s/p FFR guided PCI with DES to circumflex/first OM   . CREST variant of scleroderma ([place])   . Cystocele   . Diverticulitis   . Event monitor 04/2019   Event monitor 04/2019: Sinus rhythm; no atrial fibrillation/flutter; no bradycardic events; few PVCs and occasional ventricular runs up to 9 beats  . GERD (gastroesophageal reflux disease)   . History of blood transfusion    hx of being Anti-K positive  . History of Doppler ultrasound    a. Carotid US 2/12: bilateral ICA < 50%  . Hyperlipidemia   . Hypertension   . Ischemic cardiomyopathy    a. EF 35-40% by LHC at time of MI;  b. Echo 7/14:  Mild focal basal septal hypertrophy, EF 50-55%, apical HK, grade 1 diastolic dysfunction, trivial AI   . Ischemic colitis ([place])   . Osteoporosis 12/2017   T score -2.6  . Raynaud disease   . Rectocele    mild    Past Surgical History:  Procedure Laterality Date  . [place]  RSO for menorrhagia.  . CORONARY ANGIOPLASTY WITH STENT PLACEMENT  06/21/2013  . CORONARY STENT INTERVENTION N/A 05/30/2017   Procedure: Coronary Stent Intervention;  Surgeon: [name], MD;  Location: [place] [facility];  Service: Cardiovascular;  Laterality: N/A;  . FOOT SURGERY  2013   x2  . INTRAVASCULAR PRESSURE WIRE/FFR STUDY N/A 05/30/2017   Procedure: Intravascular Pressure Wire/FFR Study;  Surgeon: [name], MD;  Location: [place] [facility];  Service: Cardiovascular;  Laterality: N/A;  . JOINT REPLACEMENT  9/10   rt total  . LEFT HEART CATH AND CORONARY ANGIOGRAPHY N/A 05/30/2017   Procedure: Left Heart Cath and Coronary Angiography;  Surgeon: [name], MD;  Location: [place] [facility];  Service: Cardiovascular;   Laterality: N/A;  . nissen fundoplication  8299  . ROTATOR CUFF REPAIR    . TONSILLECTOMY  1950  . TOTAL KNEE ARTHROPLASTY     right  . TUBAL LIGATION  1980    Current Medications: No outpatient medications have been marked as taking for the 05/10/20 encounter (Appointment) with Ledora Bottcher, [place].     Allergies:   Aspirin, Pravastatin, Nsaids, Pravastatin sodium, and Statins   Social History   Socioeconomic History  . Marital status: Divorced    Spouse name: Not on file  . Number of children: Not on file  . Years of education: Not on file  . Highest education level: Not on file  Occupational History  . Not on file  Tobacco Use  . Smoking status: Never Smoker  . Smokeless tobacco: Never Used  Vaping Use  . Vaping Use: Never used  Substance and Sexual Activity  . Alcohol use: No    Alcohol/week: 0.0 standard drinks  . Drug use: No  . Sexual activity: Never    Comment: 1st intercourse 40 yo-1 partner  Other Topics Concern  . Not on file  Social History Narrative   Exercise dancing 3 x times weekly for 1-2 hours   Social Determinants of Health   Financial Resource Strain:   . Difficulty of Paying Living Expenses:   Food Insecurity:   . Worried About Charity fundraiser in the Last Year:   . Arboriculturist in the Last Year:   Transportation Needs:   . Film/video editor (Medical):   Marland Kitchen Lack of Transportation (Non-Medical):   Physical Activity:   . Days of Exercise per Week:   . Minutes of Exercise per Session:   Stress:   . Feeling of Stress :   Social Connections:   . Frequency of Communication with Friends and Family:   . Frequency of Social Gatherings with Friends and Family:   . Attends Religious Services:   . Active Member of Clubs or Organizations:   . Attends Archivist Meetings:   Marland Kitchen Marital Status:      Family History: The patient's ***family history includes Asthma in her maternal grandmother; Cancer in her maternal grandfather  and mother; Emphysema in her maternal grandmother; Heart attack in her father; Heart disease in her father; Hepatitis C in her mother; Hypertension in her daughter; Panic disorder in her daughter. There is no history of Stroke.  ROS:   Please see the history of present illness.    *** All other systems reviewed and are negative.  EKGs/Labs/Other Studies Reviewed:    The following studies were reviewed today: ***  EKG:  EKG is *** ordered today.  The ekg ordered today demonstrates ***  Recent Labs:  No results found for requested labs within last 8760 hours.  Recent Lipid Panel    Component Value Date/Time   CHOL 148 04/28/2017 1003   CHOL 153 07/29/2014 0926   TRIG 126 04/28/2017 1003   TRIG 112 07/29/2014 0926   HDL 45 04/28/2017 1003   HDL 49 07/29/2014 0926   CHOLHDL 3.3 04/28/2017 1003   CHOLHDL 2.6 11/23/2015 0912   VLDL 17 11/23/2015 0912   LDLCALC 78 04/28/2017 1003   LDLCALC 82 07/29/2014 0926    Physical Exam:    VS:  LMP 11/25/1994     Wt Readings from Last 3 Encounters:  05/04/20 117 lb 9.6 oz (53.3 kg)  02/22/20 124 lb (56.2 kg)  11/05/19 126 lb (57.2 kg)     GEN: *** Well nourished, well developed in no acute distress HEENT: Normal NECK: No JVD; No carotid bruits LYMPHATICS: No lymphadenopathy CARDIAC: ***RRR, no murmurs, rubs, gallops RESPIRATORY:  Clear to auscultation without rales, wheezing or rhonchi  ABDOMEN: Soft, non-tender, non-distended MUSCULOSKELETAL:  No edema; No deformity  SKIN: Warm and dry NEUROLOGIC:  Alert and oriented x 3 PSYCHIATRIC:  Normal affect   ASSESSMENT:    No diagnosis found. [name]:    In order of problems listed above:  No diagnosis found.   Medication Adjustments/Labs and Tests Ordered: Current medicines are reviewed at length with the patient today.  Concerns regarding medicines are outlined above.  No orders of the defined types were placed in this encounter.  No orders of the defined types were placed in  this encounter.   Signed, Ledora Bottcher, [place]  05/10/2020 8:29 AM    [facility]

## 2020-05-10 NOTE — Telephone Encounter (Signed)
Discussed with office supervisor, Bernardo Heater, RN concerning COVID symptoms and policy. She stated pt will need to reschedule her appt and reorder homehealth EKG--see if we can specify to be a female nurse. Per Janace Hoard, PA see if home health can come to pt house today.   I call pt to inform her of this. Pt stated that today would be good because she knows she needs the EKG, but she is "disturbed" by someone coming to her house to do it. She stated this was fine and asked that I call her back with what home health says.  Informed pt I will call her back after contacting home health.

## 2020-05-10 NOTE — Telephone Encounter (Signed)
LMTCB concerning COVID symptoms. Pt is scheduled for in office appt today with Fabian Sharp, PA. Per Luke's note from virtual visit on 6/10, pt had a positive COVID test on 5/29 and was still experiencing related symptoms such as cough, sob, and diarrhea. Want to make sure pt has been 3 days symptom free. If not, need to reschedule appt or make virtual.

## 2020-05-10 NOTE — Telephone Encounter (Addendum)
Received call back from Joen Laura who stated a pilot program was started during [place] where they were sending therapists to pt homes to do EKGs and the EKG machine was borrowed from another company. She stated that since [place] has dwindled down, they returned the EKG machine and do not have one. She stated that she is trying to figure out who will be able to do go to pt home to do the EKG and she stated she will call back with updates. She has also spoken with the patient to inform her.

## 2020-05-10 NOTE — Telephone Encounter (Signed)
Call transferred to [facility].

## 2020-05-10 NOTE — Telephone Encounter (Signed)
Pt returned call. She stated she does not have a fever; however, she is still experiencing symptoms including cough, sob, congestion and diarrhea. She stated her congestion is early in the morning and then again in the evening. She stated she is taking mucinex and imodium for the diarrhea. She also stated she was having chest pain last week. Luke PA ordered an EKG for home health to go to her house to complete. Pt stated the home health nurse was a female and he called saying he would be there in 15 minutes. Pt stated she cancelled it because she was alone in her house and did not want to be alone with a female doing her EKG. She stated she also was not aware that the nurse would be coming that day until he called.   I informed pt I will discuss with Fabian Sharp, PA and call her back to let her know recommendations. Pt verbalized thanks.

## 2020-05-11 ENCOUNTER — Other Ambulatory Visit: Payer: Self-pay | Admitting: Physician Assistant

## 2020-05-11 NOTE — Telephone Encounter (Signed)
Follow up   Patient is calling to check on the status of her EKG.

## 2020-05-11 NOTE — Telephone Encounter (Signed)
Patient states she had COVID and has been out of quarantine for 1 week but has residual symptoms - her appt 6/16 was cancelled d/t this. She was to be seen for chest pain. Because of residual symptoms, CMA/PA were trying to get remote health to do an EKG for patient, however this has not been worked out yet.   Scheduled patient for in-office PA visit on 6/21 with Richardson Dopp, as patient sees Dr. Burt Knack. Advised that she call 6/21 in the AM if she still has symptoms

## 2020-05-11 NOTE — Telephone Encounter (Addendum)
Hey guys! I just noticed Kristine [name] will not be here today. If Joen Laura calls back today with updates, will you please let me and Angie Duke know? Thank you!!

## 2020-05-11 NOTE — Telephone Encounter (Addendum)
Received secure Epic chat message from [facility]. She stated Tiffanyy Shon Baton called back and wanted to know which kind of EKG was to be done. Fabian Sharp, PA specified it should be a 12 Lead EKG and asked that we let her know when the EKG can be viewed. It may need to go to DOD or PA that saw pt last. [name], [place] had virtual visit with her last week, but he is not in the office today.

## 2020-05-12 ENCOUNTER — Encounter: Payer: Self-pay | Admitting: Cardiovascular Disease

## 2020-05-15 ENCOUNTER — Ambulatory Visit: Payer: Medicare PPO | Admitting: Physician Assistant

## 2020-05-16 ENCOUNTER — Telehealth: Payer: Self-pay

## 2020-05-16 NOTE — Telephone Encounter (Signed)
Spoke with Tiffany who stated she does not have access to a machine to do an EKG but will find out if she can get one. Stated she will send another message to the person who was the lead of the pilot program and call me back to let me know. I explained that I am trying to get in touch with the patient to reschedule appointment with cardiology, but appointments are going into the end of next month.

## 2020-05-16 NOTE — Telephone Encounter (Signed)
error 

## 2020-05-16 NOTE — Telephone Encounter (Addendum)
LVM for pt to call back concerning appointment with Richardson Dopp, PA that was cancelled yesterday. Need to reschedule appointment and see if COVID symptoms are clearing up. Will call Joen Laura to find out status of EKG.

## 2020-05-18 ENCOUNTER — Ambulatory Visit: Payer: Medicare PPO | Admitting: Physician Assistant

## 2020-05-25 ENCOUNTER — Telehealth: Payer: Self-pay | Admitting: Cardiovascular Disease

## 2020-05-25 NOTE — Telephone Encounter (Signed)
Yes she should have an appointment thanks

## 2020-05-25 NOTE — Telephone Encounter (Signed)
Called patient to schedule appointment. Left message to call back tomorrow to confirm or cancel appointment scheduled 7/6 with Melina Copa.

## 2020-05-25 NOTE — Telephone Encounter (Signed)
Transferred call to [employer]

## 2020-05-25 NOTE — Telephone Encounter (Signed)
LMTCB to schedule appt

## 2020-05-25 NOTE — Telephone Encounter (Signed)
Spoke to pt. Her EKG was completed by Home Health and needs to be forwarded to Dr. Burt Knack for him to review. I told pt I will send a message to Dr. Burt Knack to see what he says and call her back to let her know.  Dr. Wardell Heath the pt need a follow-up appointment scheduled?

## 2020-05-26 ENCOUNTER — Encounter: Payer: Self-pay | Admitting: Physician Assistant

## 2020-05-26 NOTE — Telephone Encounter (Signed)
Follow up  Patient is calling in to confirm appt on 05/30/20.

## 2020-05-26 NOTE — Telephone Encounter (Signed)
Appointment confirmed with patient.

## 2020-05-26 NOTE — Progress Notes (Signed)
Cardiology Office Note    Date:  05/30/2020   ID:  [name], DOB [birth year], MRN [id number]  PCP:  System, Provider Not In  Cardiologist:  [name], MD  Electrophysiologist:  None   Chief Complaint: f/u chest pain  History of Present Illness:   Kristine [name] is a 79 y.o. female with history of CAD with STEMI s/p PCI/stent to OM 2014, OM stenosis proximal to prior stent 2018 s/p PCI, CREST syndrome, HTN with h/o hypotension (taken off ARB), dyslipidemia (intolerant to higher dose statin), anemia, arthritis, Barrett's esophagus, ischemic cardiomyopathy (EF 35-40% at time of 2014 MI, normal by stress echo 2018), Raynaud disease, ischemic colitis, diverticulitis, anemia by labs 02/2019 who presents for follow-up of chest pain. She also reports a remote history of a blood disorder called antigen K. She was seen in 03/2019 via telehealth for syncope and was found to have newly low Hgb in the 8 range. She reports she subsequently went on iron supplementation with resolution in anemia. She declines any bleeding at that time.  More recently she was seen by [name] on 05/04/20. Per his note, "The patient had a positive COVID test in November 2020 although she tells me this was a "false positive".  Its not clear to me how that was determined. [Today's addendum: Chart review of Care Everywhere indicates the patient had had symptoms consistent with Covid including cough, nasal congestion and chest congestion. Do not see that she was deemed a false positive by medical professionals.] In May 2021 she had cough as well as diarrhea and tested positive for COVID.  She has declined the vaccine saying she does not trust it.  Since her recent bout with COVID she has had weakness.  She called the answering service at 3 in the morning a couple nights ago complaining of left-sided chest pain.  The patient describes sharp jabbing left lateral chest pain.  She says she took a nitroglycerin for this and it improved her  symptoms although she told me the symptoms only lasted for seconds.  She had this on 2 occasions.  The cardiology fellow on-call suggested she come to the emergency room for further evaluation.  She declined.  She was given this virtual visit today for follow-up, I initially suggested she come to the office but because of continued GI symptoms and diarrhea she was made a virtual visit instead." At time of the virtual appointment she denied any recurrent chest discomfort. She had an EKG done by home health 05/12/20 that showed NSR without any acute or concerning changes.  She is seen back for follow-up and is feeling great. She has not had any recurrent chest discomfort. She reports this was a jabbing type of discomfort lasting only a few seconds. No anginal-type discomfort or dyspnea. No nausea, vomiting, edema, orthopnea, palpitations. She is originally from [place]. She reports she felt sick from Covid for about a month but finally feels better.   Labwork independently reviewed: 07/2019 Hgb normalized to 13.6 02/2019 K 4.8, Cr 0.72, Hgb 8, MCV 69, plt 192 (previously 11.3 in 06/2017) No recent lipids/LFTs on file  Past Medical History:  Diagnosis Date  . Anemia   . Arthritis   . Barrett's esophagus    Stage B  . CAD (coronary artery disease)    a. s/p STEMI 7/14 >> LHC: LM 20, mLAD 30-40, small Dx 100 (L-L collats), OM1 90, OM2 30-40, mRCA 50-60, Lat/AL akinesis, EF 35-40% >> PCI: Promus DES to OM1;  b.  Myoview 3/16: diaph atten, no ischemia, EF 59% (intermediate risk b/c BP drop 152>>137, but recovery BP 162 (tech error) >> med Rx continued  c. cath 05/30/17 s/p FFR guided PCI with DES to circumflex/first OM   . COVID-19 virus infection   . CREST variant of scleroderma ([place])   . Cystocele   . Diverticulitis   . Event monitor 04/2019   Event monitor 04/2019: Sinus rhythm; no atrial fibrillation/flutter; no bradycardic events; few PVCs and occasional ventricular runs up to 9 beats  . GERD  (gastroesophageal reflux disease)   . History of blood transfusion    hx of being Anti-K positive  . History of Doppler ultrasound    a. Carotid US 2/12: bilateral ICA < 50%  . Hyperlipidemia   . Hypertension   . Ischemic cardiomyopathy    a. EF 35-40% by LHC at time of MI;  b. Echo 7/14:  Mild focal basal septal hypertrophy, EF 50-55%, apical HK, grade 1 diastolic dysfunction, trivial AI   . Ischemic colitis ([place])   . Orthostatic hypotension   . Osteoporosis 12/2017   T score -2.6  . Raynaud disease   . Rectocele    mild    Past Surgical History:  Procedure Laterality Date  . ABDOMINAL HYSTERECTOMY  1996   RSO for menorrhagia.  . CORONARY ANGIOPLASTY WITH STENT PLACEMENT  06/21/2013  . CORONARY STENT INTERVENTION N/A 05/30/2017   Procedure: Coronary Stent Intervention;  Surgeon: [name], MD;  Location: [place] [facility];  Service: Cardiovascular;  Laterality: N/A;  . FOOT SURGERY  2013   x2  . INTRAVASCULAR PRESSURE WIRE/FFR STUDY N/A 05/30/2017   Procedure: Intravascular Pressure Wire/FFR Study;  Surgeon: [name], MD;  Location: [place] [facility];  Service: Cardiovascular;  Laterality: N/A;  . JOINT REPLACEMENT  9/10   rt total  . LEFT HEART CATH AND CORONARY ANGIOGRAPHY N/A 05/30/2017   Procedure: Left Heart Cath and Coronary Angiography;  Surgeon: [name], MD;  Location: [place] [facility];  Service: Cardiovascular;  Laterality: N/A;  . nissen fundoplication  1324  . ROTATOR CUFF REPAIR    . TONSILLECTOMY  1950  . TOTAL KNEE ARTHROPLASTY     right  . TUBAL LIGATION  1980    Current Medications: Current Meds  Medication Sig  . acetaminophen (TYLENOL) 500 MG tablet Take 1,000 mg by mouth every 6 (six) hours as needed for headache.  . albuterol (VENTOLIN HFA) 108 (90 Base) MCG/ACT inhaler Inhale 1-2 puffs into the lungs every 6 (six) hours as needed for wheezing or shortness of breath.  . ALPRAZolam (XANAX) 0.25 MG tablet Take 1 tablet (0.25 mg total)  by mouth daily as needed for anxiety.  Marland Kitchen amoxicillin (AMOXIL) 500 MG capsule Patient takes 4 tablets 1 hour prior to dental work  . aspirin EC 81 MG EC tablet Take 1 tablet (81 mg total) by mouth daily.  Marland Kitchen ezetimibe (ZETIA) 10 MG tablet TAKE 1/2 TABLET EVERY DAY  . famotidine (PEPCID) 40 MG tablet Take 1 tablet by mouth at bedtime.  . fluticasone (FLONASE) 50 MCG/ACT nasal spray Place 2 sprays into both nostrils daily.  . metoprolol succinate (TOPROL-XL) 25 MG 24 hr tablet TAKE 1/2 TABLET EVERY DAY  . nitroGLYCERIN (NITROSTAT) 0.4 MG SL tablet Place 1 tablet (0.4 mg total) under the tongue every 5 (five) minutes x 3 doses as needed for chest pain.  . pantoprazole (PROTONIX) 40 MG tablet Take 40 mg by mouth daily.  . pravastatin (PRAVACHOL)  10 MG tablet TAKE 1 TABLET EVERY EVENING    Allergies:   Aspirin, Pravastatin, Nsaids, Pravastatin sodium, and Statins   Social History   Socioeconomic History  . Marital status: Divorced    Spouse name: Not on file  . Number of children: Not on file  . Years of education: Not on file  . Highest education level: Not on file  Occupational History  . Not on file  Tobacco Use  . Smoking status: Never Smoker  . Smokeless tobacco: Never Used  Vaping Use  . Vaping Use: Never used  Substance and Sexual Activity  . Alcohol use: No    Alcohol/week: 0.0 standard drinks  . Drug use: No  . Sexual activity: Never    Comment: 1st intercourse 39 yo-1 partner  Other Topics Concern  . Not on file  Social History Narrative   Exercise dancing 3 x times weekly for 1-2 hours   Social Determinants of Health   Financial Resource Strain:   . Difficulty of Paying Living Expenses:   Food Insecurity:   . Worried About Charity fundraiser in the Last Year:   . Arboriculturist in the Last Year:   Transportation Needs:   . Film/video editor (Medical):   Marland Kitchen Lack of Transportation (Non-Medical):   Physical Activity:   . Days of Exercise per Week:   .  Minutes of Exercise per Session:   Stress:   . Feeling of Stress :   Social Connections:   . Frequency of Communication with Friends and Family:   . Frequency of Social Gatherings with Friends and Family:   . Attends Religious Services:   . Active Member of Clubs or Organizations:   . Attends Archivist Meetings:   Marland Kitchen Marital Status:      Family History:  The patient's  family history includes Asthma in her maternal grandmother; Cancer in her maternal grandfather and mother; Emphysema in her maternal grandmother; Heart attack in her father; Heart disease in her father; Hepatitis C in her mother; Hypertension in her daughter; Panic disorder in her daughter. There is no history of Stroke.  ROS:   Please see the history of present illness.  All other systems are reviewed and otherwise negative.    EKGs/Labs/Other Studies Reviewed:    Studies reviewed are outlined and summarized above. Reports included below if pertinent.  LHC 05/2017 1. Single-vessel coronary artery disease with moderately severe proximal left circumflex stenosis at the proximal edge of the previously implanted stent 2. Mild nonobstructive LAD and RCA stenoses 3. Normal LV systolic function based on noninvasive assessment 4. Successful FFR guided PCI with a 3.5 x 16 mm Promus DES in the proximal circumflex/first OM  Dual antiplatelet therapy with aspirin and Plavix for a minimum of 6 months  Stress Echo 04/2017 (prompting cath above) - Abnormal stress echo.  She has normal LV function at rest.  The stress apical 2 images is slightly rotated but shows  hypokinesis in the apical inferior ( inferior septal ) region  Suggest cardiac cath  Recommendations: Suggest Cardiac cath   2D Echo 05/2013 Study Conclusions   - Left ventricle: The cavity size was normal. There was mild  focal basal hypertrophy of the septum. Systolic function  was normal. The estimated ejection fraction was in the    range of 50% to 55%. There is mild hypokinesis of the  apical myocardium. Doppler parameters are consistent with  abnormal left ventricular relaxation (grade 1 diastolic  dysfunction).  - Aortic valve: Trivial regurgitation.      EKG:  EKG is not ordered today but reviewed from 05/12/20. NSR with NSST changes similar to prior.  Recent Labs: No results found for requested labs within last 8760 hours.  Recent Lipid Panel    Component Value Date/Time   CHOL 148 04/28/2017 1003   CHOL 153 07/29/2014 0926   TRIG 126 04/28/2017 1003   TRIG 112 07/29/2014 0926   HDL 45 04/28/2017 1003   HDL 49 07/29/2014 0926   CHOLHDL 3.3 04/28/2017 1003   CHOLHDL 2.6 11/23/2015 0912   VLDL 17 11/23/2015 0912   LDLCALC 78 04/28/2017 1003   LDLCALC 82 07/29/2014 0926    PHYSICAL EXAM:    VS:  BP 134/80   Pulse 77   Ht 5\' 1"  (1.549 m)   Wt 124 lb 3.2 oz (56.3 kg)   LMP 11/25/1994   SpO2 98%   BMI 23.47 kg/m   BMI: Body mass index is 23.47 kg/m.  GEN: Well nourished, well developed WF, in no acute distress HEENT: normocephalic, atraumatic Neck: no JVD, carotid bruits, or masses Cardiac: RRR; no murmurs, rubs, or gallops, no edema  Respiratory:  clear to auscultation bilaterally, normal work of breathing GI: soft, nontender, nondistended, + BS MS: no deformity or atrophy Skin: warm and dry, no rash Neuro:  Alert and Oriented x 3, Strength and sensation are intact, follows commands Psych: euthymic mood, full affect  Wt Readings from Last 3 Encounters:  05/30/20 124 lb 3.2 oz (56.3 kg)  05/04/20 117 lb 9.6 oz (53.3 kg)  02/22/20 124 lb (56.2 kg)     ASSESSMENT & PLAN:   1. Atypical chest pain in context of known CAD - atypical discomfort, low suspicion for angina. This has not recurred. She is not tachycardic, tachypneic or hypoxic. We will continue present regimen and observe for any clinical recurrence. From Covid standpoint she feels much improved. 2. Essential HTN - BP  upper limits of normal - but given age and history of syncope, presently acceptable. Will continue present regimen. 3. Hyperlipidemia - remains on pravastatin/Zetia but no recent lipids on file with our office. Per our discussion no one has been monitoring recently. We will have her return for fasting bloodwork (CMET/lipids) at her next convenience 4. History of anemia - normalized with iron supplementation per her report. Since we are coming up on nearly 1 year since last check, will obtain f/u CBC with labs.  Disposition: F/u with Dr. Burt Knack in 6 months.  Medication Adjustments/Labs and Tests Ordered: Current medicines are reviewed at length with the patient today.  Concerns regarding medicines are outlined above. Medication changes, Labs and Tests ordered today are summarized above and listed in the Patient Instructions accessible in Encounters.   Signed, [name], PA-C  05/30/2020 3:25 PM    [facility] [address], [place], [place]  [address] Phone: [phone]; Fax: [phone]

## 2020-05-30 ENCOUNTER — Other Ambulatory Visit: Payer: Self-pay

## 2020-05-30 ENCOUNTER — Encounter: Payer: Self-pay | Admitting: Physician Assistant

## 2020-05-30 ENCOUNTER — Ambulatory Visit: Payer: Medicare PPO | Admitting: Physician Assistant

## 2020-05-30 VITALS — BP 134/80 | HR 77 | Ht 61.0 in | Wt 124.2 lb

## 2020-05-30 DIAGNOSIS — I10 Essential (primary) hypertension: Secondary | ICD-10-CM

## 2020-05-30 DIAGNOSIS — R0789 Other chest pain: Secondary | ICD-10-CM

## 2020-05-30 DIAGNOSIS — E785 Hyperlipidemia, unspecified: Secondary | ICD-10-CM

## 2020-05-30 DIAGNOSIS — I2510 Atherosclerotic heart disease of native coronary artery without angina pectoris: Secondary | ICD-10-CM | POA: Diagnosis not present

## 2020-05-30 DIAGNOSIS — Z862 Personal history of diseases of the blood and blood-forming organs and certain disorders involving the immune mechanism: Secondary | ICD-10-CM

## 2020-05-30 NOTE — Addendum Note (Signed)
Addended by: Gaetano Net on: 05/30/2020 03:43 PM   Modules accepted: Orders

## 2020-05-30 NOTE — Patient Instructions (Signed)
Medication Instructions:  Your physician recommends that you continue on your current medications as directed. Please refer to the Current Medication list given to you today.  *If you need a refill on your cardiac medications before your next appointment, please call your pharmacy*   Lab Work: AT YOUR CONVENIENCE:  CMET, LIPID, & CBC (our lab opens at 7:30.. come anytime after that and make sure you are fasting)  If you have labs (blood work) drawn today and your tests are completely normal, you will receive your results only by: Marland Kitchen MyChart Message (if you have MyChart) OR . A paper copy in the mail If you have any lab test that is abnormal or we need to change your treatment, we will call you to review the results.   Testing/Procedures: None ordered   Follow-Up: At [facility], you and your health needs are our priority.  As part of our continuing mission to provide you with exceptional heart care, we have created designated Provider Care Teams.  These Care Teams include your primary Cardiologist (physician) and Advanced Practice Providers (APPs -  Physician Assistants and Nurse Practitioners) who all work together to provide you with the care you need, when you need it.  We recommend signing up for the patient portal called "MyChart".  Sign up information is provided on this After Visit Summary.  MyChart is used to connect with patients for Virtual Visits (Telemedicine).  Patients are able to view lab/test results, encounter notes, upcoming appointments, etc.  Non-urgent messages can be sent to your provider as well.   To learn more about what you can do with MyChart, go to [email or web address].    Your next appointment:   6 month(s)  The format for your next appointment:   In Person  Provider:   [name], MD   Other Instructions

## 2020-06-06 NOTE — Telephone Encounter (Signed)
PROLIA GIVEN 02/22/2020 NEXT INJECTION 08/25/2020

## 2020-06-08 ENCOUNTER — Telehealth: Payer: Self-pay | Admitting: *Deleted

## 2020-06-08 DIAGNOSIS — I2510 Atherosclerotic heart disease of native coronary artery without angina pectoris: Secondary | ICD-10-CM

## 2020-06-08 DIAGNOSIS — E785 Hyperlipidemia, unspecified: Secondary | ICD-10-CM

## 2020-06-08 MED ORDER — PRAVASTATIN SODIUM 20 MG PO TABS: 20.0000 mg | ORAL_TABLET | Freq: Every evening | ORAL | 3 refills | Status: DC

## 2020-06-08 NOTE — Telephone Encounter (Signed)
Returned call to pt.  She has been made aware of her lab results.  She is willing to increase the Pravastatin to 20 mg daily and watch her diet and exercise.  Pt will have labs drawn in October.  However, pt is concerned.  She saw her GI and they have her on a 60 gram Carb diet, with not eating no bread, potatoes, rice.  Pt says it's all meat and she is concerned that she is working against her Cholesterol by doing this?  She wanted your thoughts?

## 2020-06-08 NOTE — Telephone Encounter (Signed)
Follow up   Pt is returning call    

## 2020-06-08 NOTE — Telephone Encounter (Signed)
The increased ratio of meat could be affecting her cholesterol. Need to be careful when doing lower carb that she's not predominantly eating a diet high in saturated fats (I.e. cheese, red meats). When doing lower carb, it's better to focus on lean meats and leaner dairy (chicken, [place] and fish over red meat) and healthier fats like nuts/seeds. Her diet should also be consisting of produce as well, not just meat. There are also many vegetables and fruits that are lower carb such as berries, zucchini, yellow squash, spaghetti squash, peppers, greens, broccoli, cauliflower, mushrooms.  The [employer] book may be of interest to her to help learn how to strike a balance between the two.

## 2020-06-08 NOTE — Telephone Encounter (Signed)
-----   Message from [name], [place] sent at 06/08/2020  7:44 AM EDT ----- Please let Ms. Vandrunen know labs overall looked good (came to me via fax rather than lab result note - suspect the scan will lag behind). Her LDL was 95, which is higher than it should be for someone who has had coronary disease. I see she's been intolerant to higher dose statin. I see she even tried low dose Crestor but could not tolerate this. Would she be willing to try pravastatin 20mg  daily, working on diet/activity, and recheck LFTs/lipids in 3 months? If not, we can also consider referral to lipid clinic to discuss other medication options. Thanks. ----- Message ----- From: Merleen Milliner Sent: 06/07/2020   3:50 PM EDT To: [name], PA-C

## 2020-06-08 NOTE — Telephone Encounter (Signed)
Pt has been made aware of the recommendations below and was appreciative of Dayna's input and will look up the [place].

## 2020-06-08 NOTE — Telephone Encounter (Signed)
Called pt re: lab results below.  Left a message for her to call back.

## 2020-06-12 NOTE — Telephone Encounter (Signed)
Called to clarify if pt will come into the office for repeat labs in October.  Per pt, she will have her remote health RN draw it at home.

## 2020-06-12 NOTE — Addendum Note (Signed)
Addended by: Gaetano Net on: 06/12/2020 07:45 AM   Modules accepted: Orders

## 2020-06-15 ENCOUNTER — Telehealth: Payer: Self-pay | Admitting: Cardiovascular Disease

## 2020-06-15 DIAGNOSIS — E785 Hyperlipidemia, unspecified: Secondary | ICD-10-CM

## 2020-06-15 DIAGNOSIS — I10 Essential (primary) hypertension: Secondary | ICD-10-CM

## 2020-06-15 NOTE — Telephone Encounter (Signed)
Per patient request, printed and faxed Kristine [name]'s orders from 7/6 visit. Order placed for nutritionist. The patient was grateful for assistance.

## 2020-06-15 NOTE — Telephone Encounter (Signed)
  Patient is calling because home health nurse is asking for her blood work orders to be faxed to them for her October blood work. Fax # [phone] Attn: Mardene Celeste  Her gastroenterologist put her on a low carb diet and Dr Burt Knack put her on a low cholesterol diet. She states that she is not doing well, feels hungry all the time and is loosing weight. She would like to be referred to a nutritionist with [facility] and is calling to see if Dr Burt Knack would refer her.

## 2020-06-19 ENCOUNTER — Other Ambulatory Visit: Payer: Self-pay | Admitting: Cardiovascular Disease

## 2020-07-28 ENCOUNTER — Ambulatory Visit: Payer: Medicare PPO | Admitting: Physician Assistant

## 2020-08-21 ENCOUNTER — Other Ambulatory Visit: Payer: Self-pay

## 2020-08-21 MED ORDER — METOPROLOL SUCCINATE ER 25 MG PO TB24: 12.5000 mg | ORAL_TABLET | Freq: Every day | ORAL | 2 refills | Status: DC

## 2020-08-21 NOTE — Telephone Encounter (Signed)
Pt's medication was sent to pt's pharmacy as requested. Confirmation received.  °

## 2020-09-04 ENCOUNTER — Telehealth: Payer: Self-pay | Admitting: *Deleted

## 2020-09-04 NOTE — Telephone Encounter (Signed)
Prolia insurance verification has been sent awaiting Summary of benefits  

## 2020-09-08 ENCOUNTER — Other Ambulatory Visit: Payer: Medicare PPO

## 2020-09-08 ENCOUNTER — Other Ambulatory Visit: Payer: Self-pay

## 2020-09-08 DIAGNOSIS — I2510 Atherosclerotic heart disease of native coronary artery without angina pectoris: Secondary | ICD-10-CM

## 2020-09-08 MED ORDER — NITROGLYCERIN 0.4 MG SL SUBL: 0.4000 mg | SUBLINGUAL_TABLET | SUBLINGUAL | 1 refills | Status: DC | PRN

## 2020-09-08 NOTE — Telephone Encounter (Signed)
Deductible n/a  OOP MAX  [id number] [phone])  [place]ual exam 02/22/2020 [place]  Calcium             Date NEEDS LABS Upcoming dental procedures   Prior Authorization needed YES ON FILE UNTIL 11/24/2020  Pt estimated Cost $222      Coverage Details: 20% OF ONE DOSE, 20% ADMIN FEE

## 2020-09-08 NOTE — Telephone Encounter (Signed)
Called patient to set up Prolia appt. She states she does not want to use this medication anymore. No alterative as well. Will route to MD for awareness.

## 2020-09-12 ENCOUNTER — Other Ambulatory Visit: Payer: Self-pay

## 2020-09-12 MED ORDER — PRAVASTATIN SODIUM 20 MG PO TABS: 20.0000 mg | ORAL_TABLET | Freq: Every evening | ORAL | 2 refills | Status: DC

## 2020-09-12 MED ORDER — EZETIMIBE 10 MG PO TABS: ORAL_TABLET | ORAL | 2 refills | Status: DC

## 2020-09-12 MED ORDER — METOPROLOL SUCCINATE ER 25 MG PO TB24: 12.5000 mg | ORAL_TABLET | Freq: Every day | ORAL | 2 refills | Status: DC

## 2020-09-12 NOTE — Telephone Encounter (Signed)
Noted, she can discuss at her next annual visit, or sooner if she would like with whomever she plans to see for her annual visits

## 2020-09-13 ENCOUNTER — Telehealth: Payer: Self-pay

## 2020-09-13 MED ORDER — PRAVASTATIN SODIUM 20 MG PO TABS: 20.0000 mg | ORAL_TABLET | Freq: Every evening | ORAL | 0 refills | Status: DC

## 2020-09-13 NOTE — Telephone Encounter (Signed)
Pt calling requesting a 10 day supply sent to local [facility] until mail order arrives. Confirmation received.

## 2020-09-13 NOTE — Telephone Encounter (Signed)
[facility] called in and stated they have on file that pt is allergic to Statin and specifically pravastatin .  [place] just wanted to make sure that this was on to fill    Best number [phone]

## 2020-09-19 ENCOUNTER — Encounter (INDEPENDENT_AMBULATORY_CARE_PROVIDER_SITE_OTHER): Payer: Medicare PPO | Admitting: Ophthalmology

## 2020-10-09 ENCOUNTER — Ambulatory Visit: Payer: Medicare Other | Admitting: Physician Assistant

## 2020-10-09 ENCOUNTER — Other Ambulatory Visit: Payer: Self-pay

## 2020-10-09 ENCOUNTER — Emergency Department
Admission: RE | Admit: 2020-10-09 | Discharge: 2020-10-09 | Disposition: A | Discharge status: Home / Self Care | Payer: Medicare PPO | Source: Ambulatory Visit

## 2020-10-09 VITALS — BP 135/78 | HR 63 | Temp 98.0°F | Resp 17 | Ht 61.0 in | Wt 124.0 lb

## 2020-10-09 DIAGNOSIS — R067 Sneezing: Secondary | ICD-10-CM

## 2020-10-09 DIAGNOSIS — I219 Acute myocardial infarction, unspecified: Secondary | ICD-10-CM | POA: Insufficient documentation

## 2020-10-09 DIAGNOSIS — J302 Other seasonal allergic rhinitis: Secondary | ICD-10-CM

## 2020-10-09 DIAGNOSIS — R0981 Nasal congestion: Secondary | ICD-10-CM

## 2020-10-09 DIAGNOSIS — I214 Non-ST elevation (NSTEMI) myocardial infarction: Secondary | ICD-10-CM | POA: Insufficient documentation

## 2020-10-09 DIAGNOSIS — K2100 Gastro-esophageal reflux disease with esophagitis, without bleeding: Secondary | ICD-10-CM | POA: Insufficient documentation

## 2020-10-09 HISTORY — DX: Obstructive sleep apnea (adult) (pediatric): G47.33

## 2020-10-09 MED ORDER — CETIRIZINE HCL 5 MG PO TABS: 5.0000 mg | ORAL_TABLET | Freq: Every day | ORAL | 0 refills | Status: DC

## 2020-10-09 MED ORDER — IPRATROPIUM BROMIDE 0.06 % NA SOLN: 2.0000 | Freq: Four times a day (QID) | NASAL | 1 refills | Status: DC

## 2020-10-09 NOTE — ED Provider Notes (Signed)
[name]    CSN: [id number] Arrival date & time: 10/09/20  1653      History   Chief Complaint Chief Complaint  Patient presents with  . Appointment  . Nasal Congestion    HPI Kristine [name] is a 79 y.o. female.   HPI Kristine [name] is a 79 y.o. female presenting to UC with c/o nasal congestion and sneezing over the last 1 month. She has taken mucinex and used flonase without relief. She took a COVID test 2 weeks ago, which was negative. Denies fever, chills, cough, HA, sore throat or ear pain. No n/v/d. No sick contacts. She does note her neighbors have been blowing leaves a lot recently.  She does not currently have a PCP. She does not take any allergy medication. No new soaps, lotions or medications. No new pets.    Past Medical History:  Diagnosis Date  . Anemia   . Arthritis   . Barrett's esophagus    Stage B  . CAD (coronary artery disease)    a. s/p STEMI 7/14 >> LHC: LM 20, mLAD 30-40, small Dx 100 (L-L collats), OM1 90, OM2 30-40, mRCA 50-60, Lat/AL akinesis, EF 35-40% >> PCI: Promus DES to OM1;  b. Myoview 3/16: diaph atten, no ischemia, EF 59% (intermediate risk b/c BP drop 152>>137, but recovery BP 162 (tech error) >> med Rx continued  c. cath 05/30/17 s/p FFR guided PCI with DES to circumflex/first OM   . COVID-19 virus infection   . CREST variant of scleroderma ([place])   . Cystocele   . Diverticulitis   . Event monitor 04/2019   Event monitor 04/2019: Sinus rhythm; no atrial fibrillation/flutter; no bradycardic events; few PVCs and occasional ventricular runs up to 9 beats  . GERD (gastroesophageal reflux disease)   . History of blood transfusion    hx of being Anti-K positive  . History of Doppler ultrasound    a. Carotid US 2/12: bilateral ICA < 50%  . Hyperlipidemia   . Hypertension   . Ischemic cardiomyopathy    a. EF 35-40% by LHC at time of MI;  b. Echo 7/14:  Mild focal basal septal hypertrophy, EF 50-55%, apical HK, grade 1 diastolic  dysfunction, trivial AI   . Ischemic colitis ([place])   . Orthostatic hypotension   . OSA on CPAP   . Osteoporosis 12/2017   T score -2.6  . Raynaud disease   . Rectocele    mild    Patient Active Problem List   Diagnosis Date Noted  . Gastroesophageal reflux disease with esophagitis 10/09/2020  . MI (myocardial infarction) ([place]) 10/09/2020  . COVID-19 virus infection 05/04/2020  . Type 2 macular telangiectasis, right 03/20/2020  . Cystoid macular edema of right eye 03/20/2020  . Type 2 macular telangiectasis, left 03/20/2020  . Obstructive sleep apnea, adult 03/20/2020  . Cr(e)st syndrome ([place]) 05/25/2018  . Ulcer of esophagus with bleeding 02/27/2018  . Drug-induced constipation 02/24/2018  . Psychophysiological insomnia 09/24/2017  . Abnormal nuclear cardiac imaging test 05/30/2017  . Abnormal stress echo   . Solar lentigo 11/08/2016  . Acute blood loss anemia 10/10/2015  . Colitis 10/08/2015  . Red blood cell antibody positive 10/04/2015  . CAD (coronary artery disease), native coronary artery 06/27/2013  . Hyperlipidemia 06/27/2013  . Chest pain of uncertain etiology 62/22/9798  . History of ST elevation myocardial infarction (STEMI) 06/24/2013  . Hypertension   . Great toe pain 09/11/2012  . H/O foot surgery 04/09/2012  . Hallux  varus (acquired) 02/25/2012  . GERD (gastroesophageal reflux disease)   . Barrett's esophagus   . Cystocele   . Rectocele     Past Surgical History:  Procedure Laterality Date  . ABDOMINAL HYSTERECTOMY  1996   RSO for menorrhagia.  . CORONARY ANGIOPLASTY WITH STENT PLACEMENT  06/21/2013  . CORONARY STENT INTERVENTION N/A 05/30/2017   Procedure: Coronary Stent Intervention;  Surgeon: [name], MD;  Location: [facility];  Service: Cardiovascular;  Laterality: N/A;  . FOOT SURGERY  2013   x2  . INTRAVASCULAR PRESSURE WIRE/FFR STUDY N/A 05/30/2017   Procedure: Intravascular Pressure Wire/FFR Study;  Surgeon: [name], MD;   Location: [facility];  Service: Cardiovascular;  Laterality: N/A;  . JOINT REPLACEMENT  9/10   rt total  . LEFT HEART CATH AND CORONARY ANGIOGRAPHY N/A 05/30/2017   Procedure: Left Heart Cath and Coronary Angiography;  Surgeon: [name], MD;  Location: [facility];  Service: Cardiovascular;  Laterality: N/A;  . nissen fundoplication  7001  . ROTATOR CUFF REPAIR    . TONSILLECTOMY  1950  . TOTAL KNEE ARTHROPLASTY     right  . TUBAL LIGATION  1980    OB History    Gravida  1   Para  1   Term      Preterm      AB      Living  1     SAB      TAB      Ectopic      Multiple      Live Births               Home Medications    Prior to Admission medications   Medication Sig Start Date End Date Taking? Authorizing Provider  ALPRAZolam (XANAX) 0.25 MG tablet Take 1 tablet (0.25 mg total) by mouth daily as needed for anxiety. 09/17/17  Yes Wardell Honour, MD  aspirin EC 81 MG EC tablet Take 1 tablet (81 mg total) by mouth daily. 06/24/13  Yes Barrett, Evelene Croon, PA-C  Cholecalciferol (VITAMIN D3) 125 MCG (5000 UT) CAPS Take by mouth.   Yes [provider]  ezetimibe (ZETIA) 10 MG tablet TAKE 1/2 TABLET EVERY DAY 09/12/20  Yes [name], MD  famotidine (PEPCID) 40 MG tablet Take 1 tablet by mouth at bedtime. 12/21/18  Yes [provider]  fluticasone (FLONASE) 50 MCG/ACT nasal spray Place 2 sprays into both nostrils daily. 04/18/20  Yes Genessis Flanary O, PA-C  magnesium 30 MG tablet Take 30 mg by mouth daily at 2 PM.   Yes [provider]  metoprolol succinate (TOPROL-XL) 25 MG 24 hr tablet Take 0.5 tablets (12.5 mg total) by mouth daily. 09/12/20  Yes [name], MD  pantoprazole (PROTONIX) 40 MG tablet Take 40 mg by mouth daily.   Yes [provider]  Probiotic Product (PROBIOTIC-10 ULTIMATE PO) Take by mouth.   Yes [provider]  acetaminophen (TYLENOL) 500 MG tablet Take 1,000 mg by mouth every 6  (six) hours as needed for headache.    [provider]  albuterol (VENTOLIN HFA) 108 (90 Base) MCG/ACT inhaler Inhale 1-2 puffs into the lungs every 6 (six) hours as needed for wheezing or shortness of breath. 04/18/20   Noe Gens, PA-C  amoxicillin (AMOXIL) 500 MG tablet Take 500 mg by mouth as needed. Per patient taking (4) tablets 1 hour prior to dental procedure.    [provider]  cetirizine (ZYRTEC) 5 MG tablet Take 1 tablet (5 mg total) by mouth daily. 10/09/20   Noe Gens, PA-C  ipratropium (ATROVENT) 0.06 % nasal spray Place 2 sprays into both nostrils 4 (four) times daily. 10/09/20   Noe Gens, PA-C  nitroGLYCERIN (NITROSTAT) 0.4 MG SL tablet Place 1 tablet (0.4 mg total) under the tongue every 5 (five) minutes x 3 doses as needed for chest pain. 09/08/20   [name], MD  pravastatin (PRAVACHOL) 20 MG tablet Take 1 tablet (20 mg total) by mouth every evening. Patient taking differently: Take 20 mg by mouth every evening. 1/2 tablet per pt 09/13/20   [name], MD    Family History Family History  Problem Relation Age of Onset  . Cancer Mother        Pancreatic Cancer  . Hepatitis C Mother   . Heart disease Father        congestive heart failure  . Heart attack Father   . Panic disorder Daughter   . Hypertension Daughter   . Asthma Maternal Grandmother   . Emphysema Maternal Grandmother   . Cancer Maternal Grandfather        lung  . Stroke Neg Hx     Social History Social History   Tobacco Use  . Smoking status: Never Smoker  . Smokeless tobacco: Never Used  Vaping Use  . Vaping Use: Never used  Substance Use Topics  . Alcohol use: No    Alcohol/week: 0.0 standard drinks  . Drug use: No     Allergies   Aspirin, Pravastatin, Nsaids, Pravastatin sodium, and Statins   Review of Systems Review of Systems  Constitutional: Negative for chills and fever.  HENT: Positive for congestion and sneezing. Negative for ear pain,  sore throat, trouble swallowing and voice change.   Respiratory: Negative for cough and shortness of breath.   Cardiovascular: Negative for chest pain and palpitations.  Gastrointestinal: Negative for abdominal pain, diarrhea, nausea and vomiting.  Musculoskeletal: Negative for arthralgias, back pain and myalgias.  Skin: Negative for rash.  All other systems reviewed and are negative.    Physical Exam Triage Vital Signs ED Triage Vitals  Enc Vitals Group     BP 10/09/20 1713 135/78     Pulse Rate 10/09/20 1713 63     Resp 10/09/20 1713 17     Temp 10/09/20 1713 98 F (36.7 C)     Temp Source 10/09/20 1713 Oral     SpO2 10/09/20 1713 99 %     Weight 10/09/20 1714 124 lb (56.2 kg)     Height 10/09/20 1714 5\' 1"  (1.549 m)     Head Circumference --      Peak Flow --      Pain Score 10/09/20 1714 0     Pain Loc --      Pain Edu? --      Excl. in [place]? --    No data found.  Updated Vital Signs BP 135/78 (BP Location: Right Arm)   Pulse 63   Temp 98 F (36.7 C) (Oral)   Resp 17   Ht 5\' 1"  (1.549 m)   Wt 124 lb (56.2 kg)   LMP 11/25/1994   SpO2 99%   BMI 23.43 kg/m   Visual Acuity Right Eye Distance:   Left Eye Distance:   Bilateral Distance:    Right Eye Near:   Left Eye Near:    Bilateral Near:     Physical Exam Vitals  and nursing note reviewed.  Constitutional:      General: She is not in acute distress.    Appearance: Normal appearance. She is well-developed. She is not ill-appearing, toxic-appearing or diaphoretic.  HENT:     Head: Normocephalic and atraumatic.     Right Ear: Tympanic membrane and ear canal normal.     Left Ear: Tympanic membrane and ear canal normal.     Nose: Mucosal edema present.     Right Sinus: No maxillary sinus tenderness or frontal sinus tenderness.     Left Sinus: No maxillary sinus tenderness or frontal sinus tenderness.     Mouth/Throat:     Lips: Pink.     Mouth: Mucous membranes are moist.     Pharynx: Oropharynx is clear.  Uvula midline. No pharyngeal swelling, oropharyngeal exudate, posterior oropharyngeal erythema or uvula swelling.  Cardiovascular:     Rate and Rhythm: Normal rate and regular rhythm.  Pulmonary:     Effort: Pulmonary effort is normal. No respiratory distress.     Breath sounds: Normal breath sounds. No stridor. No wheezing, rhonchi or rales.  Musculoskeletal:        General: Normal range of motion.     Cervical back: Normal range of motion and neck supple. No tenderness.  Lymphadenopathy:     Cervical: No cervical adenopathy.  Skin:    General: Skin is warm and dry.  Neurological:     Mental Status: She is alert and oriented to person, place, and time.  Psychiatric:        Behavior: Behavior normal.      UC Treatments / Results  Labs (all labs ordered are listed, but only abnormal results are displayed) Labs Reviewed - No data to display  EKG   Radiology No results found.  Procedures Procedures (including critical care time)  Medications Ordered in UC Medications - No data to display  Initial Impression / Assessment and Plan / UC Course  I have reviewed the triage vital signs and the nursing notes.  Pertinent labs & imaging results that were available during my care of the patient were reviewed by me and considered in my medical decision making (see chart for details).     Pt had negative COVID 2 weeks ago, at least 2 weeks after symptom onset Doubt COVID today No sneezing during exam today but nasal mucosa edema noted. Will tx for allergic rhinitis Encouraged f/u with PCP and ENT  AVS given   Final Clinical Impressions(s) / UC Diagnoses   Final diagnoses:  Nasal congestion  Seasonal allergic rhinitis, unspecified trigger  Sneezing     Discharge Instructions      Call to schedule an appointment with a primary care provider to establish care for recheck of symptoms and for ongoing healthcare needs.  Call to schedule an appointment with an ear, nose  and throat specialist for further evaluation and treatment of symptoms if sneezing and congestion continue despite the medication prescribed today.     ED Prescriptions    Medication Sig Dispense Auth. Provider   ipratropium (ATROVENT) 0.06 % nasal spray Place 2 sprays into both nostrils 4 (four) times daily. 45 each Noe Gens, PA-C   cetirizine (ZYRTEC) 5 MG tablet Take 1 tablet (5 mg total) by mouth daily. 14 tablet Noe Gens, [place]     PDMP not reviewed this encounter.   Noe Gens, [place] 10/09/20 1910

## 2020-10-09 NOTE — Discharge Instructions (Signed)
  Call to schedule an appointment with a primary care provider to establish care for recheck of symptoms and for ongoing healthcare needs.  Call to schedule an appointment with an ear, nose and throat specialist for further evaluation and treatment of symptoms if sneezing and congestion continue despite the medication prescribed today.

## 2020-10-09 NOTE — ED Triage Notes (Signed)
C/o excessive sneezing the last month Sinus congestion  Pt wonders if she is allergic to leaves, pt are blowing leaves in the neighborhood OTC mucinex & flonase -min relief COVID test (negative) 2 weeks ago  NO COVID vaccine

## 2020-10-11 ENCOUNTER — Other Ambulatory Visit: Payer: Self-pay

## 2020-10-11 ENCOUNTER — Encounter (INDEPENDENT_AMBULATORY_CARE_PROVIDER_SITE_OTHER): Payer: Self-pay | Admitting: Ophthalmology

## 2020-10-11 ENCOUNTER — Ambulatory Visit (INDEPENDENT_AMBULATORY_CARE_PROVIDER_SITE_OTHER): Payer: Medicare PPO | Admitting: Ophthalmology

## 2020-10-11 DIAGNOSIS — H35071 Retinal telangiectasis, right eye: Secondary | ICD-10-CM | POA: Diagnosis not present

## 2020-10-11 DIAGNOSIS — H35073 Retinal telangiectasis, bilateral: Secondary | ICD-10-CM | POA: Diagnosis not present

## 2020-10-11 DIAGNOSIS — H35072 Retinal telangiectasis, left eye: Secondary | ICD-10-CM

## 2020-10-11 DIAGNOSIS — H35352 Cystoid macular degeneration, left eye: Secondary | ICD-10-CM | POA: Insufficient documentation

## 2020-10-11 MED ORDER — FLUORESCEIN SODIUM 10 % IV SOLN
500.0000 mg | INTRAVENOUS | Status: AC | PRN
  Administered 2020-10-11: 500 mg via INTRAVENOUS

## 2020-10-11 NOTE — Progress Notes (Signed)
10/11/2020     CHIEF COMPLAINT Patient presents for Retina Follow Up   HISTORY OF PRESENT ILLNESS: Kristine [name] is a 79 y.o. female who presents to the clinic today for:   HPI    Retina Follow Up    Patient presents with  Other.  In both eyes.  This started 7 months ago.  Severity is mild.  Duration of 7 months.  Since onset it is stable.          Comments    7 Month F/U OU; And now only 2 months on continuous CPAP use, sense of wellbeing has improved and now sleeps for 0.1 hours to 5.1 hours without interruption which is an improvement  Pt denies noticeable changes to [place] OU since last visit. Pt denies ocular pain, flashes of light, or floaters OU.         Last edited by Hurman Horn, MD on 10/11/2020  2:41 PM. (History)      Referring physician: No referring provider defined for this encounter.  HISTORICAL INFORMATION:   Selected notes from the MEDICAL RECORD NUMBER       CURRENT MEDICATIONS: No current outpatient medications on file. (Ophthalmic Drugs)   No current facility-administered medications for this visit. (Ophthalmic Drugs)   Current Outpatient Medications (Other)  Medication Sig  . acetaminophen (TYLENOL) 500 MG tablet Take 1,000 mg by mouth every 6 (six) hours as needed for headache.  . albuterol (VENTOLIN HFA) 108 (90 Base) MCG/ACT inhaler Inhale 1-2 puffs into the lungs every 6 (six) hours as needed for wheezing or shortness of breath.  . ALPRAZolam (XANAX) 0.25 MG tablet Take 1 tablet (0.25 mg total) by mouth daily as needed for anxiety.  Marland Kitchen amoxicillin (AMOXIL) 500 MG tablet Take 500 mg by mouth as needed. Per patient taking (4) tablets 1 hour prior to dental procedure.  Marland Kitchen aspirin EC 81 MG EC tablet Take 1 tablet (81 mg total) by mouth daily.  . cetirizine (ZYRTEC) 5 MG tablet Take 1 tablet (5 mg total) by mouth daily.  . Cholecalciferol (VITAMIN D3) 125 MCG (5000 UT) CAPS Take by mouth.  . ezetimibe (ZETIA) 10 MG tablet TAKE 1/2 TABLET EVERY DAY    . famotidine (PEPCID) 40 MG tablet Take 1 tablet by mouth at bedtime.  . fluticasone (FLONASE) 50 MCG/ACT nasal spray Place 2 sprays into both nostrils daily.  Marland Kitchen ipratropium (ATROVENT) 0.06 % nasal spray Place 2 sprays into both nostrils 4 (four) times daily.  . magnesium 30 MG tablet Take 30 mg by mouth daily at 2 PM.  . metoprolol succinate (TOPROL-XL) 25 MG 24 hr tablet Take 0.5 tablets (12.5 mg total) by mouth daily.  . nitroGLYCERIN (NITROSTAT) 0.4 MG SL tablet Place 1 tablet (0.4 mg total) under the tongue every 5 (five) minutes x 3 doses as needed for chest pain.  . pantoprazole (PROTONIX) 40 MG tablet Take 40 mg by mouth daily.  . pravastatin (PRAVACHOL) 20 MG tablet Take 1 tablet (20 mg total) by mouth every evening. (Patient taking differently: Take 20 mg by mouth every evening. 1/2 tablet per pt)  . Probiotic Product (PROBIOTIC-10 ULTIMATE PO) Take by mouth.   No current facility-administered medications for this visit. (Other)      REVIEW OF SYSTEMS:    ALLERGIES Allergies  Allergen Reactions  . Aspirin Other (See Comments)    Other reaction(s): Other Can take 81 mg but not aspirin based medications Pt is prone to bleeding, ok with low dose  coated aspirin  . Pravastatin     Other reaction(s): Other Other reaction(s): Other (See Comments) elevated LFT'S, tolerates 1/2 dose of zetia and 1/2 dose of pravastatin  . Nsaids Other (See Comments)    Pt is prone to bleeding  . Pravastatin Sodium     Other reaction(s): Other (See Comments) elevated LFT'S, tolerates 1/2 dose of zetia and 1/2 dose of pravastatin  . Statins Other (See Comments)    elevated LFT'S, tolerates 1/2 dose of zetia and 1/2 dose of pravastatin    PAST MEDICAL HISTORY Past Medical History:  Diagnosis Date  . Anemia   . Arthritis   . Barrett's esophagus    Stage B  . CAD (coronary artery disease)    a. s/p STEMI 7/14 >> LHC: LM 20, mLAD 30-40, small Dx 100 (L-L collats), OM1 90, OM2 30-40, mRCA  50-60, Lat/AL akinesis, EF 35-40% >> PCI: Promus DES to OM1;  b. Myoview 3/16: diaph atten, no ischemia, EF 59% (intermediate risk b/c BP drop 152>>137, but recovery BP 162 (tech error) >> med Rx continued  c. cath 05/30/17 s/p FFR guided PCI with DES to circumflex/first OM   . COVID-19 virus infection   . CREST variant of scleroderma ([place])   . Cystocele   . Diverticulitis   . Event monitor 04/2019   Event monitor 04/2019: Sinus rhythm; no atrial fibrillation/flutter; no bradycardic events; few PVCs and occasional ventricular runs up to 9 beats  . GERD (gastroesophageal reflux disease)   . History of blood transfusion    hx of being Anti-K positive  . History of Doppler ultrasound    a. Carotid US 2/12: bilateral ICA < 50%  . Hyperlipidemia   . Hypertension   . Ischemic cardiomyopathy    a. EF 35-40% by LHC at time of MI;  b. Echo 7/14:  Mild focal basal septal hypertrophy, EF 50-55%, apical HK, grade 1 diastolic dysfunction, trivial AI   . Ischemic colitis ([place])   . Orthostatic hypotension   . OSA on CPAP   . Osteoporosis 12/2017   T score -2.6  . Raynaud disease   . Rectocele    mild   Past Surgical History:  Procedure Laterality Date  . ABDOMINAL HYSTERECTOMY  1996   RSO for menorrhagia.  . CORONARY ANGIOPLASTY WITH STENT PLACEMENT  06/21/2013  . CORONARY STENT INTERVENTION N/A 05/30/2017   Procedure: Coronary Stent Intervention;  Surgeon: [name], MD;  Location: [facility];  Service: Cardiovascular;  Laterality: N/A;  . FOOT SURGERY  2013   x2  . INTRAVASCULAR PRESSURE WIRE/FFR STUDY N/A 05/30/2017   Procedure: Intravascular Pressure Wire/FFR Study;  Surgeon: [name], MD;  Location: [facility];  Service: Cardiovascular;  Laterality: N/A;  . JOINT REPLACEMENT  9/10   rt total  . LEFT HEART CATH AND CORONARY ANGIOGRAPHY N/A 05/30/2017   Procedure: Left Heart Cath and Coronary Angiography;  Surgeon: [name], MD;  Location: [facility];   Service: Cardiovascular;  Laterality: N/A;  . nissen fundoplication  2202  . ROTATOR CUFF REPAIR    . TONSILLECTOMY  1950  . TOTAL KNEE ARTHROPLASTY     right  . TUBAL LIGATION  1980    FAMILY HISTORY Family History  Problem Relation Age of Onset  . Cancer Mother        Pancreatic Cancer  . Hepatitis C Mother   . Heart disease Father        congestive heart failure  .  Heart attack Father   . Panic disorder Daughter   . Hypertension Daughter   . Asthma Maternal Grandmother   . Emphysema Maternal Grandmother   . Cancer Maternal Grandfather        lung  . Stroke Neg Hx     SOCIAL HISTORY Social History   Tobacco Use  . Smoking status: Never Smoker  . Smokeless tobacco: Never Used  Vaping Use  . Vaping Use: Never used  Substance Use Topics  . Alcohol use: No    Alcohol/week: 0.0 standard drinks  . Drug use: No         OPHTHALMIC EXAM:  Base Eye Exam    Visual Acuity (ETDRS)      Right Left   Dist [place] 20/25 +2 20/20 -2       Tonometry (Tonopen, 1:43 PM)      Right Left   Pressure 15 17       Pupils      Pupils Dark Light Shape React APD   Right PERRL 5 4 Round Brisk None   Left PERRL 5 4 Round Brisk None       Visual Fields (Counting fingers)      Left Right    Full Full       Extraocular Movement      Right Left    Full Full       Neuro/Psych    Oriented x3: Yes   Mood/Affect: Normal       Dilation    Both eyes: 1.0% Mydriacyl, 2.5% Phenylephrine @ 1:48 PM        Slit Lamp and Fundus Exam    External Exam      Right Left   External Normal Normal       Slit Lamp Exam      Right Left   Lids/Lashes Normal Normal   Conjunctiva/Sclera White and quiet White and quiet   Cornea Clear Clear   Anterior Chamber Deep and quiet Deep and quiet   Iris Round and reactive Round and reactive   Lens Posterior chamber intraocular lens Posterior chamber intraocular lens   Anterior Vitreous Normal Normal       Fundus Exam      Right Left    Posterior Vitreous Normal Normal   Disc Normal Normal   C/D Ratio 0.3 0.55   Macula no macular thickening no macular thickening, Microaneurysms   Vessels Normal Normal   Periphery Normal Normal          IMAGING AND PROCEDURES  Imaging and Procedures for 10/11/20  OCT, Retina - OU - Both Eyes       Right Eye Quality was good. Scan locations included subfoveal. Central Foveal Thickness: 293. Progression has improved. Findings include cystoid macular edema.   Left Eye Quality was good. Scan locations included subfoveal. Central Foveal Thickness: 298. Progression has improved. Findings include cystoid macular edema.   Notes Mild perifoveal CME slightly improved only 2 months status post onset use       Color Fundus Photography Optos - OU - Both Eyes       Right Eye Progression has been stable. Disc findings include normal observations. Macula : normal observations. Vessels : normal observations. Periphery : normal observations.   Left Eye Progression has been stable. Disc findings include normal observations. Macula : drusen, normal observations. Vessels : normal observations. Periphery : normal observations.  ASSESSMENT/PLAN:  Cystoid macular edema of left eye CME OS, small residual focal finding but this is not active and is much less than in 2018 and before.  This is a residual of previous active MAC-TEL which is no longer angiographically evident  Type 2 macular telangiectasis, left Angiographic disappearance of type II macular telangiectasis left eye,  after onset of use of CPAP  Type 2 macular telangiectasis, right Complete resolution of CME and angiographic CME post onset use of CPAP      ICD-10-CM   1. Type 2 macular telangiectasis, right  H35.071 OCT, Retina - OU - Both Eyes    Color Fundus Photography Optos - OU - Both Eyes    Fluorescein Angiography Optos (Transit OS)  2. Type 2 macular telangiectasis, left  H35.072 OCT, Retina - OU  - Both Eyes    Color Fundus Photography Optos - OU - Both Eyes    Fluorescein Angiography Optos (Transit OS)  3. Cystoid macular edema of left eye  H35.352     1.  Macular CME OU improved and stable, angiographic evidence of clear resolution of type II macular telangiectasis of each eye, now post recent onset of CPAP use  2.  3.  Ophthalmic Meds Ordered this visit:  No orders of the defined types were placed in this encounter.      Return in about 6 months (around 04/10/2021) for DILATE OU, OCT.  There are no Patient Instructions on file for this visit.   Explained the diagnoses, plan, and follow up with the patient and they expressed understanding.  Patient expressed understanding of the importance of proper follow up care.   Clent Demark Jerelyn Trimarco M.D. Diseases & Surgery of the Retina and Vitreous [facility] [place] 10/11/20     Abbreviations: M myopia (nearsighted); A astigmatism; H hyperopia (farsighted); P presbyopia; Mrx spectacle prescription;  CTL contact lenses; OD right eye; OS left eye; OU both eyes  XT exotropia; ET esotropia; PEK punctate epithelial keratitis; PEE punctate epithelial erosions; DES dry eye syndrome; MGD meibomian gland dysfunction; ATs artificial tears; PFAT's preservative free artificial tears; [place] nuclear sclerotic cataract; PSC posterior subcapsular cataract; ERM epi-retinal membrane; PVD posterior vitreous detachment; RD retinal detachment; DM diabetes mellitus; DR diabetic retinopathy; NPDR non-proliferative diabetic retinopathy; PDR proliferative diabetic retinopathy; CSME clinically significant macular edema; DME diabetic macular edema; dbh dot blot hemorrhages; CWS cotton wool spot; POAG primary open angle glaucoma; C/D cup-to-disc ratio; HVF humphrey visual field; GVF goldmann visual field; OCT optical coherence tomography; IOP intraocular pressure; BRVO Branch retinal vein occlusion; CRVO central retinal vein occlusion; CRAO central retinal  artery occlusion; BRAO branch retinal artery occlusion; RT retinal tear; SB scleral buckle; PPV pars plana vitrectomy; VH Vitreous hemorrhage; PRP panretinal laser photocoagulation; IVK intravitreal kenalog; VMT vitreomacular traction; MH Macular hole;  NVD neovascularization of the disc; NVE neovascularization elsewhere; AREDS age related eye disease study; ARMD age related macular degeneration; POAG primary open angle glaucoma; EBMD epithelial/anterior basement membrane dystrophy; ACIOL anterior chamber intraocular lens; IOL intraocular lens; PCIOL posterior chamber intraocular lens; Phaco/IOL phacoemulsification with intraocular lens placement; [place] photorefractive keratectomy; LASIK laser assisted in situ keratomileusis; HTN hypertension; DM diabetes mellitus; COPD chronic obstructive pulmonary disease

## 2020-10-11 NOTE — Assessment & Plan Note (Signed)
Complete resolution of CME and angiographic CME post onset use of CPAP

## 2020-10-11 NOTE — Assessment & Plan Note (Signed)
CME OS, small residual focal finding but this is not active and is much less than in 2018 and before.  This is a residual of previous active MAC-TEL which is no longer angiographically evident

## 2020-10-11 NOTE — Assessment & Plan Note (Addendum)
Angiographic disappearance of type II macular telangiectasis left eye,  after onset of use of CPAP

## 2020-12-07 DIAGNOSIS — Z20822 Contact with and (suspected) exposure to covid-19: Secondary | ICD-10-CM | POA: Diagnosis not present

## 2020-12-10 DIAGNOSIS — G4733 Obstructive sleep apnea (adult) (pediatric): Secondary | ICD-10-CM | POA: Diagnosis not present

## 2020-12-14 DIAGNOSIS — K219 Gastro-esophageal reflux disease without esophagitis: Secondary | ICD-10-CM | POA: Diagnosis not present

## 2020-12-14 DIAGNOSIS — K2270 Barrett's esophagus without dysplasia: Secondary | ICD-10-CM | POA: Diagnosis not present

## 2020-12-14 DIAGNOSIS — M341 CR(E)ST syndrome: Secondary | ICD-10-CM | POA: Diagnosis not present

## 2020-12-19 DIAGNOSIS — J310 Chronic rhinitis: Secondary | ICD-10-CM | POA: Diagnosis not present

## 2020-12-19 DIAGNOSIS — H9011 Conductive hearing loss, unilateral, right ear, with unrestricted hearing on the contralateral side: Secondary | ICD-10-CM | POA: Diagnosis not present

## 2020-12-22 DIAGNOSIS — G4733 Obstructive sleep apnea (adult) (pediatric): Secondary | ICD-10-CM | POA: Diagnosis not present

## 2020-12-27 DIAGNOSIS — G4733 Obstructive sleep apnea (adult) (pediatric): Secondary | ICD-10-CM | POA: Diagnosis not present

## 2020-12-29 DIAGNOSIS — G4733 Obstructive sleep apnea (adult) (pediatric): Secondary | ICD-10-CM | POA: Diagnosis not present

## 2021-01-10 DIAGNOSIS — G4733 Obstructive sleep apnea (adult) (pediatric): Secondary | ICD-10-CM | POA: Diagnosis not present

## 2021-01-30 ENCOUNTER — Telehealth: Payer: Self-pay | Admitting: Cardiovascular Disease

## 2021-01-30 DIAGNOSIS — R42 Dizziness and giddiness: Secondary | ICD-10-CM

## 2021-01-30 NOTE — Telephone Encounter (Signed)
Pt c/o BP issue: STAT if pt c/o blurred vision, one-sided weakness or slurred speech  1. What are your last 5 BP readings?  146/69 149/78 142/74 HR- 110    2. Are you having any other symptoms (ex. Dizziness, headache, blurred vision, passed out)? Lightheadedness (denies dizziness)  3. What is your BP issue?  Patient states her BP has been fluctuating . She states she has also been on and off lightheaded since 01/25/21. Today she is asymptomatic. She denies having been dizzy or faint, but she assumes the lightheadedness may be related to her BP fluctuation. She states one day she even noticed herself veering to the left when she stood up to walk. She scheduled an appointment for 02/05/21 with Robbie Lis, PA and she would like a call back with advisement.

## 2021-01-30 NOTE — Telephone Encounter (Signed)
Kristine [name] complains of fluctuating BP and "extreme lightheadedness" for several days.  She says she is lightheaded, off-balance, and when she walks she inadvertently veers to the left.  She denies dizziness and does not feel like the room is spinning.  She has never felt like she will faint. BP readings:  01/27/21 133/66 01/28/21 146/69 01/29/21 149/78 Reiterated to her that her BP is likely not causing her symptoms and asked about her HR. She had several readings over the last few days - 62, 80, 90, 110, 81 and she stated her [name] told her last week it sensed an "arrhythmia." She has not had anymore of those notifications since that 30 or so minute episode.  She is scheduled to see Robbie Lis on Monday.  She understands she will be called if Dr. Burt Knack has recommendations prior to that time.

## 2021-01-31 NOTE — Telephone Encounter (Signed)
Patient agrees to have carotid ultrasound. Will have scheduler try to arrange prior to visit Monday.

## 2021-01-31 NOTE — Telephone Encounter (Signed)
Carotids scheduled 02/01/2021.

## 2021-01-31 NOTE — Telephone Encounter (Signed)
Agree - symptoms don't sound likely related to BP. I would recommend checking a carotid US study as well. thanks

## 2021-02-01 ENCOUNTER — Ambulatory Visit (HOSPITAL_COMMUNITY)
Admission: RE | Admit: 2021-02-01 | Discharge: 2021-02-01 | Disposition: A | Discharge status: Home / Self Care | Payer: Medicare Other | Source: Ambulatory Visit | Attending: Cardiology | Admitting: Cardiology

## 2021-02-01 ENCOUNTER — Other Ambulatory Visit: Payer: Self-pay

## 2021-02-01 DIAGNOSIS — R42 Dizziness and giddiness: Secondary | ICD-10-CM | POA: Insufficient documentation

## 2021-02-04 NOTE — Progress Notes (Unsigned)
Cardiology Office Note:    Date:  02/05/2021   ID:  [name], DOB Aug 07, 1941, MRN [id number]  PCP:  System, Provider Not In  [facility] Cardiologist:  [name], MD  [facility] Electrophysiologist:  None   Chief Complaint: dizziness  History of Present Illness:    Kristine [name] is a 80 y.o. female with a hx of with history of CAD with STEMI s/p PCI/stent to OM 2014, OM stenosis proximal to prior stent 2018 s/p PCI, CREST syndrome, ischemic cardiomyopathy (EF 35-40% at time of 2014 MI, normal by stress echo 2018), HTN with h/o hypotension (taken off ARB), dyslipidemia (intolerant to higher dose statin), anemia, arthritis, Barrett's esophagus, Raynaud disease, ischemic colitis, diverticulitis, anemia requiring iron infusion in past seen for dizziness.   Last seen by Melina Copa 05/2020.   Called last week with dizziness. Followed up carotid doppler without evidence of bialteral ICA.   Patient is here for follow-up.  Patient reported dizziness for past 3 to 4 weeks.  This been intermittent without associated symptoms.  Occurs at rest and with ambulation.  Mostly occurs when waking up first thing in the morning.  Heart rate mostly in 80s, intermittently goes in 110.  Patient has Acardia device and tracking rhythm strip.  No arrhythmia on review of strips.  Patient is dealing with mouth sores.  Not drinking enough water.  Only about 20 ounces of fluid.  Blood pressure relatively stable at home.  Intermittently goes to 160s, however this is mostly when is anxious.  Past Medical History:  Diagnosis Date  . Anemia   . Arthritis   . Barrett's esophagus    Stage B  . CAD (coronary artery disease)    a. s/p STEMI 7/14 >> LHC: LM 20, mLAD 30-40, small Dx 100 (L-L collats), OM1 90, OM2 30-40, mRCA 50-60, Lat/AL akinesis, EF 35-40% >> PCI: Promus DES to OM1;  b. Myoview 3/16: diaph atten, no ischemia, EF 59% (intermediate risk b/c BP drop 152>>137, but recovery BP 162 (tech error) >> med Rx  continued  c. cath 05/30/17 s/p FFR guided PCI with DES to circumflex/first OM   . COVID-19 virus infection   . CREST variant of scleroderma ([place])   . Cystocele   . Diverticulitis   . Event monitor 04/2019   Event monitor 04/2019: Sinus rhythm; no atrial fibrillation/flutter; no bradycardic events; few PVCs and occasional ventricular runs up to 9 beats  . GERD (gastroesophageal reflux disease)   . History of blood transfusion    hx of being Anti-K positive  . History of Doppler ultrasound    a. Carotid US 2/12: bilateral ICA < 50%  . Hyperlipidemia   . Hypertension   . Ischemic cardiomyopathy    a. EF 35-40% by LHC at time of MI;  b. Echo 7/14:  Mild focal basal septal hypertrophy, EF 50-55%, apical HK, grade 1 diastolic dysfunction, trivial AI   . Ischemic colitis ([place])   . Orthostatic hypotension   . OSA on CPAP   . Osteoporosis 12/2017   T score -2.6  . Raynaud disease   . Rectocele    mild    Past Surgical History:  Procedure Laterality Date  . ABDOMINAL HYSTERECTOMY  1996   RSO for menorrhagia.  . CORONARY ANGIOPLASTY WITH STENT PLACEMENT  06/21/2013  . CORONARY STENT INTERVENTION N/A 05/30/2017   Procedure: Coronary Stent Intervention;  Surgeon: [name], MD;  Location: [facility];  Service: Cardiovascular;  Laterality: N/A;  . FOOT  SURGERY  2013   x2  . INTRAVASCULAR PRESSURE WIRE/FFR STUDY N/A 05/30/2017   Procedure: Intravascular Pressure Wire/FFR Study;  Surgeon: [name], MD;  Location: [facility];  Service: Cardiovascular;  Laterality: N/A;  . JOINT REPLACEMENT  9/10   rt total  . LEFT HEART CATH AND CORONARY ANGIOGRAPHY N/A 05/30/2017   Procedure: Left Heart Cath and Coronary Angiography;  Surgeon: [name], MD;  Location: [facility];  Service: Cardiovascular;  Laterality: N/A;  . nissen fundoplication  0254  . ROTATOR CUFF REPAIR    . TONSILLECTOMY  1950  . TOTAL KNEE ARTHROPLASTY     right  . TUBAL LIGATION  1980     Current Medications: Current Meds  Medication Sig  . acetaminophen (TYLENOL) 500 MG tablet Take 1,000 mg by mouth every 6 (six) hours as needed for headache.  . albuterol (VENTOLIN HFA) 108 (90 Base) MCG/ACT inhaler Inhale 1-2 puffs into the lungs every 6 (six) hours as needed for wheezing or shortness of breath.  . ALPRAZolam (XANAX) 0.25 MG tablet Take 1 tablet (0.25 mg total) by mouth daily as needed for anxiety.  Marland Kitchen amoxicillin (AMOXIL) 500 MG tablet Take 500 mg by mouth as needed. Per patient taking (4) tablets 1 hour prior to dental procedure.  Marland Kitchen aspirin EC 81 MG EC tablet Take 1 tablet (81 mg total) by mouth daily.  . cetirizine (ZYRTEC) 5 MG tablet Take 1 tablet (5 mg total) by mouth daily.  . Cholecalciferol (VITAMIN D3) 125 MCG (5000 UT) CAPS Take by mouth.  . ezetimibe (ZETIA) 10 MG tablet TAKE 1/2 TABLET EVERY DAY  . famotidine (PEPCID) 40 MG tablet Take 1 tablet by mouth at bedtime.  . fluticasone (FLONASE) 50 MCG/ACT nasal spray Place 2 sprays into both nostrils daily.  Marland Kitchen ipratropium (ATROVENT) 0.06 % nasal spray Place 2 sprays into both nostrils 4 (four) times daily.  . magnesium 30 MG tablet Take 30 mg by mouth daily at 2 PM.  . metoprolol succinate (TOPROL-XL) 25 MG 24 hr tablet Take 0.5 tablets (12.5 mg total) by mouth daily.  . nitroGLYCERIN (NITROSTAT) 0.4 MG SL tablet Place 1 tablet (0.4 mg total) under the tongue every 5 (five) minutes x 3 doses as needed for chest pain.  . pantoprazole (PROTONIX) 40 MG tablet Take 40 mg by mouth daily.  . pravastatin (PRAVACHOL) 20 MG tablet Take 1 tablet (20 mg total) by mouth every evening. (Patient taking differently: Take 20 mg by mouth every evening. 1/2 tablet per pt)  . Probiotic Product (PROBIOTIC-10 ULTIMATE PO) Take by mouth.     Allergies:   Aspirin, Pravastatin, Nsaids, Pravastatin sodium, and Statins   Social History   Socioeconomic History  . Marital status: Divorced    Spouse name: Not on file  . Number of  children: Not on file  . Years of education: Not on file  . Highest education level: Not on file  Occupational History  . Not on file  Tobacco Use  . Smoking status: Never Smoker  . Smokeless tobacco: Never Used  Vaping Use  . Vaping Use: Never used  Substance and Sexual Activity  . Alcohol use: No    Alcohol/week: 0.0 standard drinks  . Drug use: No  . Sexual activity: Never    Comment: 1st intercourse 89 yo-1 partner  Other Topics Concern  . Not on file  Social History Narrative   Exercise dancing 3 x times weekly for 1-2 hours   Social Determinants  of Health   Financial Resource Strain: Not on file  Food Insecurity: Not on file  Transportation Needs: Not on file  Physical Activity: Not on file  Stress: Not on file  Social Connections: Not on file     Family History: The patient's family history includes Asthma in her maternal grandmother; Cancer in her maternal grandfather and mother; Emphysema in her maternal grandmother; Heart attack in her father; Heart disease in her father; Hepatitis C in her mother; Hypertension in her daughter; Panic disorder in her daughter. There is no history of Stroke.   ROS:   Please see the history of present illness.    All other systems reviewed and are negative.   EKGs/Labs/Other Studies Reviewed:    The following studies were reviewed today:  Monitor 04/2019 The basic rhythm is normal sinus There is no atrial fibrillation or flutter There are no bradycardic events There are few PVCs and occasional ventricular runs up to 9 beats    Coronary Stent Intervention 05/2017  Intravascular Pressure Wire/FFR Study  Left Heart Cath and Coronary Angiography    Conclusion  1. Single-vessel coronary artery disease with moderately severe proximal left circumflex stenosis at the proximal edge of the previously implanted stent 2. Mild nonobstructive LAD and RCA stenoses 3. Normal LV systolic function based on noninvasive assessment 4.  Successful FFR guided PCI with a 3.5 x 16 mm Promus DES in the proximal circumflex/first OM  Dual antiplatelet therapy with aspirin and Plavix for a minimum of 6 months   EKG:  EKG is not  ordered today.   Recent Labs: No results found for requested labs within last 8760 hours.  Recent Lipid Panel    Component Value Date/Time   CHOL 148 04/28/2017 1003   CHOL 153 07/29/2014 0926   TRIG 126 04/28/2017 1003   TRIG 112 07/29/2014 0926   HDL 45 04/28/2017 1003   HDL 49 07/29/2014 0926   CHOLHDL 3.3 04/28/2017 1003   CHOLHDL 2.6 11/23/2015 0912   VLDL 17 11/23/2015 0912   LDLCALC 78 04/28/2017 1003   LDLCALC 82 07/29/2014 0926    Physical Exam:    VS:  BP (!) 146/70   Pulse 91   Ht 5\' 1"  (1.549 m)   Wt 126 lb 6.4 oz (57.3 kg)   LMP 11/25/1994   BMI 23.88 kg/m     Wt Readings from Last 3 Encounters:  02/05/21 126 lb 6.4 oz (57.3 kg)  10/09/20 124 lb (56.2 kg)  05/30/20 124 lb 3.2 oz (56.3 kg)     GEN: Well nourished, well developed in no acute distress HEENT: Normal NECK: No JVD; No carotid bruits LYMPHATICS: No lymphadenopathy CARDIAC: RRR, no murmurs, rubs, gallops RESPIRATORY:  Clear to auscultation without rales, wheezing or rhonchi  ABDOMEN: Soft, non-tender, non-distended MUSCULOSKELETAL:  No edema; No deformity  SKIN: Warm and dry NEUROLOGIC:  Alert and oriented x 3 PSYCHIATRIC:  Normal affect   ASSESSMENT AND PLAN:    1. Dizziness Orthostatic VS for the past 24 hrs:  BP- Lying Pulse- Lying BP- Sitting Pulse- Sitting BP- Standing at 0 minutes Pulse- Standing at 0 minutes  02/05/21 1229 138/70 78 140/72 85 132/58 95   BP 122/52 & HR 89 after 3 minutes of standing.    Patient is not orthostatic by vitals as above however blood pressure did drop with dizziness.  This could be culprit.  Patient is not eating and drinking well due to mouth sore.  I have advised to increase fluid  intake and wear compression stocking.  If still symptomatic, advised to hold  low-dose metoprolol for few days and let us know response. No vertigo type symptoms.  No arrhythmia on rhythm strip.  Reviewed personally Intermittent elevated high blood pressure is anxiety related. Carotid Doppler without evidence of disease.    2.  CAD No angina.  Continue aspirin, Zetia and low-dose beta-blocker  3.  Hyperlipidemia -No results found for requested labs within last 8760 hours.  -Continue zetia  Medication Adjustments/Labs and Tests Ordered: Current medicines are reviewed at length with the patient today.  Concerns regarding medicines are outlined above.  No orders of the defined types were placed in this encounter.  No orders of the defined types were placed in this encounter.   Patient Instructions  Medication Instructions:  Your physician recommends that you continue on your current medications as directed. Please refer to the Current Medication list given to you today. *If you need a refill on your cardiac medications before your next appointment, please call your pharmacy*   Lab Work: None If you have labs (blood work) drawn today and your tests are completely normal, you will receive your results only by: Marland Kitchen MyChart Message (if you have MyChart) OR . A paper copy in the mail If you have any lab test that is abnormal or we need to change your treatment, we will call you to review the results.   Testing/Procedures: None   Follow-Up: At [facility], you and your health needs are our priority.  As part of our continuing mission to provide you with exceptional heart care, we have created designated Provider Care Teams.  These Care Teams include your primary Cardiologist (physician) and Advanced Practice Providers (APPs -  Physician Assistants and Nurse Practitioners) who all work together to provide you with the care you need, when you need it.  We recommend signing up for the patient portal called "MyChart".  Sign up information is provided on this  After Visit Summary.  MyChart is used to connect with patients for Virtual Visits (Telemedicine).  Patients are able to view lab/test results, encounter notes, upcoming appointments, etc.  Non-urgent messages can be sent to your provider as well.   To learn more about what you can do with MyChart, go to [email or web address].    Your next appointment:   6 month(s)  The format for your next appointment:   In Person  Provider:   [name], MD       Signed, Leanor Kail, [place]  02/05/2021 1:09 PM    [place]

## 2021-02-05 ENCOUNTER — Other Ambulatory Visit: Payer: Self-pay

## 2021-02-05 ENCOUNTER — Encounter: Payer: Self-pay | Admitting: Physician Assistant

## 2021-02-05 ENCOUNTER — Ambulatory Visit: Payer: Medicare Other | Admitting: Physician Assistant

## 2021-02-05 VITALS — BP 146/70 | HR 91 | Ht 61.0 in | Wt 126.4 lb

## 2021-02-05 DIAGNOSIS — I10 Essential (primary) hypertension: Secondary | ICD-10-CM | POA: Diagnosis not present

## 2021-02-05 DIAGNOSIS — I2510 Atherosclerotic heart disease of native coronary artery without angina pectoris: Secondary | ICD-10-CM | POA: Diagnosis not present

## 2021-02-05 DIAGNOSIS — R42 Dizziness and giddiness: Secondary | ICD-10-CM

## 2021-02-05 NOTE — Patient Instructions (Addendum)
Medication Instructions:  Your physician recommends that you continue on your current medications as directed. Please refer to the Current Medication list given to you today. *If you need a refill on your cardiac medications before your next appointment, please call your pharmacy*   Lab Work: None If you have labs (blood work) drawn today and your tests are completely normal, you will receive your results only by: Marland Kitchen MyChart Message (if you have MyChart) OR . A paper copy in the mail If you have any lab test that is abnormal or we need to change your treatment, we will call you to review the results.   Testing/Procedures: None   Follow-Up: At [facility], you and your health needs are our priority.  As part of our continuing mission to provide you with exceptional heart care, we have created designated Provider Care Teams.  These Care Teams include your primary Cardiologist (physician) and Advanced Practice Providers (APPs -  Physician Assistants and Nurse Practitioners) who all work together to provide you with the care you need, when you need it.  We recommend signing up for the patient portal called "MyChart".  Sign up information is provided on this After Visit Summary.  MyChart is used to connect with patients for Virtual Visits (Telemedicine).  Patients are able to view lab/test results, encounter notes, upcoming appointments, etc.  Non-urgent messages can be sent to your provider as well.   To learn more about what you can do with MyChart, go to [email or web address].    Your next appointment:   6 month(s)  The format for your next appointment:   In Person  Provider:   [name], MD

## 2021-02-07 DIAGNOSIS — G4733 Obstructive sleep apnea (adult) (pediatric): Secondary | ICD-10-CM | POA: Diagnosis not present

## 2021-02-09 ENCOUNTER — Telehealth: Payer: Self-pay | Admitting: Cardiovascular Disease

## 2021-02-09 NOTE — Telephone Encounter (Signed)
Pt c/o medication issue:  1. Name of Medication: metoprolol succinate (TOPROL-XL) 25 MG 24 hr tablet    2. How are you currently taking this medication (dosage and times per day)? Take 0.5 tablets (12.5 mg total) by mouth daily.  3. Are you having a reaction (difficulty breathing--STAT)? na  4. What is your medication issue? Pt called in and per Bhagat she was to go off of this med for a few days to see how she feels.  She stated she feel a lot better.  The lightness has gone away.  And her bp has be around 140's/ 70's and her pulse is sometime in the 80's and 90's   Best number [phone]

## 2021-02-14 NOTE — Telephone Encounter (Signed)
Pt has been made aware that she can stay off of the Toprol. She reported her bp was 126/68 with HR 77. Today, 136/79 with HR 82.    Pt was thankful for the call.

## 2021-02-14 NOTE — Telephone Encounter (Signed)
Stop Toprol.

## 2021-02-18 DIAGNOSIS — R197 Diarrhea, unspecified: Secondary | ICD-10-CM | POA: Diagnosis not present

## 2021-02-23 DIAGNOSIS — Z20822 Contact with and (suspected) exposure to covid-19: Secondary | ICD-10-CM | POA: Diagnosis not present

## 2021-02-23 DIAGNOSIS — Z03818 Encounter for observation for suspected exposure to other biological agents ruled out: Secondary | ICD-10-CM | POA: Diagnosis not present

## 2021-03-10 DIAGNOSIS — G4733 Obstructive sleep apnea (adult) (pediatric): Secondary | ICD-10-CM | POA: Diagnosis not present

## 2021-03-28 DIAGNOSIS — R197 Diarrhea, unspecified: Secondary | ICD-10-CM | POA: Diagnosis not present

## 2021-03-29 DIAGNOSIS — G4733 Obstructive sleep apnea (adult) (pediatric): Secondary | ICD-10-CM | POA: Diagnosis not present

## 2021-04-02 DIAGNOSIS — R197 Diarrhea, unspecified: Secondary | ICD-10-CM | POA: Diagnosis not present

## 2021-04-04 ENCOUNTER — Other Ambulatory Visit: Payer: Self-pay | Admitting: *Deleted

## 2021-04-04 NOTE — Patient Outreach (Signed)
[facility]) Care Management  04/04/2021  Sierah Lacewell 09-29-1941 [id number]   Member previously active with [employer].  Outgoing call placed to member for initial screening/assessment, no answer, HIPAA compliant voice message left.  Will send outreach letter and follow up within the next 3-4 business days.  [name], [place], MSN [place] [phone]

## 2021-04-09 ENCOUNTER — Other Ambulatory Visit: Payer: Self-pay | Admitting: *Deleted

## 2021-04-09 DIAGNOSIS — K5909 Other constipation: Secondary | ICD-10-CM | POA: Diagnosis not present

## 2021-04-09 DIAGNOSIS — G4733 Obstructive sleep apnea (adult) (pediatric): Secondary | ICD-10-CM | POA: Diagnosis not present

## 2021-04-09 NOTE — Patient Outreach (Signed)
[facility]) Care Management  04/09/2021  Kristine [name] 03/12/1941 [id number]   Outreach attempt #2, successful.  Identity verified.  Member previously active with [employer].  This care manager introduced self and stated purpose of call.  [facility] care management services explained.    She declines participation at this time.  State she will think about it, should she proceed with participation, she will call this care manager.  Contact information provided.  Will close case at this time.  [name], [place], MSN [place] [phone]

## 2021-04-10 ENCOUNTER — Encounter (INDEPENDENT_AMBULATORY_CARE_PROVIDER_SITE_OTHER): Payer: Medicare PPO | Admitting: Ophthalmology

## 2021-04-12 ENCOUNTER — Ambulatory Visit: Payer: Medicare Other | Admitting: Family Medicine

## 2021-04-25 ENCOUNTER — Other Ambulatory Visit: Payer: Self-pay

## 2021-04-25 MED ORDER — EZETIMIBE 10 MG PO TABS: ORAL_TABLET | ORAL | 3 refills | Status: DC

## 2021-04-25 MED ORDER — PRAVASTATIN SODIUM 20 MG PO TABS
20.0000 mg | ORAL_TABLET | Freq: Every evening | ORAL | 3 refills | Status: DC
Start: 2021-04-25 — End: 2022-04-10

## 2021-04-25 NOTE — Telephone Encounter (Signed)
Pt's medications were sent to pt's pharmacy as requested. Confirmation received.  

## 2021-04-30 ENCOUNTER — Encounter (INDEPENDENT_AMBULATORY_CARE_PROVIDER_SITE_OTHER): Payer: Self-pay | Admitting: Ophthalmology

## 2021-04-30 ENCOUNTER — Other Ambulatory Visit: Payer: Self-pay

## 2021-04-30 ENCOUNTER — Ambulatory Visit (INDEPENDENT_AMBULATORY_CARE_PROVIDER_SITE_OTHER): Payer: Medicare Other | Admitting: Ophthalmology

## 2021-04-30 DIAGNOSIS — H35071 Retinal telangiectasis, right eye: Secondary | ICD-10-CM | POA: Diagnosis not present

## 2021-04-30 DIAGNOSIS — G4733 Obstructive sleep apnea (adult) (pediatric): Secondary | ICD-10-CM | POA: Diagnosis not present

## 2021-04-30 DIAGNOSIS — H35072 Retinal telangiectasis, left eye: Secondary | ICD-10-CM

## 2021-04-30 DIAGNOSIS — H35351 Cystoid macular degeneration, right eye: Secondary | ICD-10-CM

## 2021-04-30 DIAGNOSIS — H35352 Cystoid macular degeneration, left eye: Secondary | ICD-10-CM | POA: Diagnosis not present

## 2021-04-30 NOTE — Assessment & Plan Note (Signed)
Patient continues on CPAP for the last 9 months or so prior to that she was on an oral EMA device.  Recently, 6 days has had trouble with attachment of the mass to face with straps and undergoing a reassessment to that and meets with the respiratory therapy folks in the near future next week

## 2021-04-30 NOTE — Assessment & Plan Note (Signed)
Resolved from 2018, as a late onset, 4 years after cataract surgery,

## 2021-04-30 NOTE — Assessment & Plan Note (Addendum)
Stable over time, while on CPAP now previously on oral EMA device

## 2021-04-30 NOTE — Assessment & Plan Note (Signed)
Stable over time, previously on oral EMA device

## 2021-04-30 NOTE — Assessment & Plan Note (Signed)
Resolved 2018, as late onset CME from cataract surgery 2014

## 2021-04-30 NOTE — Progress Notes (Signed)
04/30/2021     CHIEF COMPLAINT Patient presents for Retina Follow Up (6 Mo F/U OU//Pt denies noticeable changes to [place] OU since last visit. Pt denies ocular pain, flashes of light, or floaters OU. //)   HISTORY OF PRESENT ILLNESS: Kristine [name] is a 80 y.o. female who presents to the clinic today for:   HPI    Retina Follow Up    Diagnosis: Other   Laterality: both eyes   Onset: 6 months ago   Severity: mild   Duration: 6 months   Course: stable   Comments: 6 Mo F/U OU  Pt denies noticeable changes to [place] OU since last visit. Pt denies ocular pain, flashes of light, or floaters OU.          Last edited by Rockie Neighbours, [place] on 04/30/2021  1:24 PM. (History)      Referring physician: No referring provider defined for this encounter.  HISTORICAL INFORMATION:   Selected notes from the MEDICAL RECORD NUMBER       CURRENT MEDICATIONS: No current outpatient medications on file. (Ophthalmic Drugs)   No current facility-administered medications for this visit. (Ophthalmic Drugs)   Current Outpatient Medications (Other)  Medication Sig  . acetaminophen (TYLENOL) 500 MG tablet Take 1,000 mg by mouth every 6 (six) hours as needed for headache.  . albuterol (VENTOLIN HFA) 108 (90 Base) MCG/ACT inhaler Inhale 1-2 puffs into the lungs every 6 (six) hours as needed for wheezing or shortness of breath.  . ALPRAZolam (XANAX) 0.25 MG tablet Take 1 tablet (0.25 mg total) by mouth daily as needed for anxiety.  Marland Kitchen amoxicillin (AMOXIL) 500 MG tablet Take 500 mg by mouth as needed. Per patient taking (4) tablets 1 hour prior to dental procedure.  Marland Kitchen aspirin EC 81 MG EC tablet Take 1 tablet (81 mg total) by mouth daily.  . cetirizine (ZYRTEC) 5 MG tablet Take 1 tablet (5 mg total) by mouth daily.  . Cholecalciferol (VITAMIN D3) 125 MCG (5000 UT) CAPS Take by mouth.  . ezetimibe (ZETIA) 10 MG tablet TAKE 1/2 TABLET EVERY DAY  . famotidine (PEPCID) 40 MG tablet Take 1 tablet by mouth at bedtime.   . fluticasone (FLONASE) 50 MCG/ACT nasal spray Place 2 sprays into both nostrils daily.  Marland Kitchen ipratropium (ATROVENT) 0.06 % nasal spray Place 2 sprays into both nostrils 4 (four) times daily.  . magnesium 30 MG tablet Take 30 mg by mouth daily at 2 PM.  . nitroGLYCERIN (NITROSTAT) 0.4 MG SL tablet Place 1 tablet (0.4 mg total) under the tongue every 5 (five) minutes x 3 doses as needed for chest pain.  . pantoprazole (PROTONIX) 40 MG tablet Take 40 mg by mouth daily.  . pravastatin (PRAVACHOL) 20 MG tablet Take 1 tablet (20 mg total) by mouth every evening.  . Probiotic Product (PROBIOTIC-10 ULTIMATE PO) Take by mouth.  . valACYclovir (VALTREX) 1000 MG tablet Take 1,000 mg by mouth 2 (two) times daily.   No current facility-administered medications for this visit. (Other)      REVIEW OF SYSTEMS:    ALLERGIES Allergies  Allergen Reactions  . Aspirin Other (See Comments)    Other reaction(s): Other Can take 81 mg but not aspirin based medications Pt is prone to bleeding, ok with low dose coated aspirin  . Pravastatin     Other reaction(s): Other Other reaction(s): Other (See Comments) elevated LFT'S, tolerates 1/2 dose of zetia and 1/2 dose of pravastatin  . Nsaids Other (See Comments)  Pt is prone to bleeding  . Pravastatin Sodium     Other reaction(s): Other (See Comments) elevated LFT'S, tolerates 1/2 dose of zetia and 1/2 dose of pravastatin  . Statins Other (See Comments)    elevated LFT'S, tolerates 1/2 dose of zetia and 1/2 dose of pravastatin    PAST MEDICAL HISTORY Past Medical History:  Diagnosis Date  . Anemia   . Arthritis   . Barrett's esophagus    Stage B  . CAD (coronary artery disease)    a. s/p STEMI 7/14 >> LHC: LM 20, mLAD 30-40, small Dx 100 (L-L collats), OM1 90, OM2 30-40, mRCA 50-60, Lat/AL akinesis, EF 35-40% >> PCI: Promus DES to OM1;  b. Myoview 3/16: diaph atten, no ischemia, EF 59% (intermediate risk b/c BP drop 152>>137, but recovery BP 162  (tech error) >> med Rx continued  c. cath 05/30/17 s/p FFR guided PCI with DES to circumflex/first OM   . COVID-19 virus infection   . CREST variant of scleroderma ([place])   . Cystocele   . Diverticulitis   . Event monitor 04/2019   Event monitor 04/2019: Sinus rhythm; no atrial fibrillation/flutter; no bradycardic events; few PVCs and occasional ventricular runs up to 9 beats  . GERD (gastroesophageal reflux disease)   . History of blood transfusion    hx of being Anti-K positive  . History of Doppler ultrasound    a. Carotid US 2/12: bilateral ICA < 50%  . Hyperlipidemia   . Hypertension   . Ischemic cardiomyopathy    a. EF 35-40% by LHC at time of MI;  b. Echo 7/14:  Mild focal basal septal hypertrophy, EF 50-55%, apical HK, grade 1 diastolic dysfunction, trivial AI   . Ischemic colitis ([place])   . Orthostatic hypotension   . OSA on CPAP   . Osteoporosis 12/2017   T score -2.6  . Raynaud disease   . Rectocele    mild   Past Surgical History:  Procedure Laterality Date  . ABDOMINAL HYSTERECTOMY  1996   RSO for menorrhagia.  . CORONARY ANGIOPLASTY WITH STENT PLACEMENT  06/21/2013  . CORONARY STENT INTERVENTION N/A 05/30/2017   Procedure: Coronary Stent Intervention;  Surgeon: [name], MD;  Location: [facility];  Service: Cardiovascular;  Laterality: N/A;  . FOOT SURGERY  2013   x2  . INTRAVASCULAR PRESSURE WIRE/FFR STUDY N/A 05/30/2017   Procedure: Intravascular Pressure Wire/FFR Study;  Surgeon: [name], MD;  Location: [facility];  Service: Cardiovascular;  Laterality: N/A;  . JOINT REPLACEMENT  9/10   rt total  . LEFT HEART CATH AND CORONARY ANGIOGRAPHY N/A 05/30/2017   Procedure: Left Heart Cath and Coronary Angiography;  Surgeon: [name], MD;  Location: [facility];  Service: Cardiovascular;  Laterality: N/A;  . nissen fundoplication  7209  . ROTATOR CUFF REPAIR    . TONSILLECTOMY  1950  . TOTAL KNEE ARTHROPLASTY     right  . TUBAL  LIGATION  1980    FAMILY HISTORY Family History  Problem Relation Age of Onset  . Cancer Mother        Pancreatic Cancer  . Hepatitis C Mother   . Heart disease Father        congestive heart failure  . Heart attack Father   . Panic disorder Daughter   . Hypertension Daughter   . Asthma Maternal Grandmother   . Emphysema Maternal Grandmother   . Cancer Maternal Grandfather  lung  . Stroke Neg Hx     SOCIAL HISTORY Social History   Tobacco Use  . Smoking status: Never Smoker  . Smokeless tobacco: Never Used  Vaping Use  . Vaping Use: Never used  Substance Use Topics  . Alcohol use: No    Alcohol/week: 0.0 standard drinks  . Drug use: No         OPHTHALMIC EXAM: Base Eye Exam    Visual Acuity (ETDRS)      Right Left   Dist [place] 20/25 +2 20/25 +2       Tonometry (Tonopen, 1:30 PM)      Right Left   Pressure 15 15       Pupils      Pupils Dark Light Shape React APD   Right PERRL 6 5 Round Brisk None   Left PERRL 6 5 Round Brisk None       Visual Fields (Counting fingers)      Left Right    Full Full       Extraocular Movement      Right Left    Full Full       Neuro/Psych    Oriented x3: Yes   Mood/Affect: Normal       Dilation    Both eyes: 1.0% Mydriacyl, 2.5% Phenylephrine @ 1:30 PM        Slit Lamp and Fundus Exam    External Exam      Right Left   External Normal Normal       Slit Lamp Exam      Right Left   Lids/Lashes Normal Normal   Conjunctiva/Sclera White and quiet White and quiet   Cornea Clear Clear   Anterior Chamber Deep and quiet Deep and quiet   Iris Round and reactive Round and reactive   Lens Posterior chamber intraocular lens Posterior chamber intraocular lens   Anterior Vitreous Normal Normal       Fundus Exam      Right Left   Posterior Vitreous Normal Normal   Disc Normal Normal   C/D Ratio 0.3 0.55   Macula no macular thickening no macular thickening, Microaneurysms   Vessels Normal Normal    Periphery Normal Normal          IMAGING AND PROCEDURES  Imaging and Procedures for 04/30/21  OCT, Retina - OU - Both Eyes       Right Eye Quality was good. Scan locations included subfoveal. Central Foveal Thickness: 293. Progression has been stable. Findings include cystoid macular edema.   Left Eye Quality was good. Scan locations included subfoveal. Central Foveal Thickness: 299. Progression has been stable. Findings include cystoid macular edema.   Notes Mild perifoveal CME slightly improved only   9 months status post onset use  Persistent perifoveal schisis type residual from previous active macular retinal schisis.  May need fluorescein angiography in the near future.                ASSESSMENT/PLAN:  Obstructive sleep apnea, adult Patient continues on CPAP for the last 9 months or so prior to that she was on an oral EMA device.  Recently, 6 days has had trouble with attachment of the mass to face with straps and undergoing a reassessment to that and meets with the respiratory therapy folks in the near future next week    Type 2 macular telangiectasis, left Stable over time, while on CPAP now previously on oral EMA device  Type 2 macular  telangiectasis, right Stable over time, previously on oral EMA device  Cystoid macular edema of left eye Resolved from 2018, as a late onset, 4 years after cataract surgery,  Cystoid macular edema of right eye Resolved 2018, as late onset CME from cataract surgery 2014      ICD-10-CM   1. Type 2 macular telangiectasis, right  H35.071 OCT, Retina - OU - Both Eyes  2. Type 2 macular telangiectasis, left  H35.072 OCT, Retina - OU - Both Eyes  3. Obstructive sleep apnea, adult  G47.33   4. Cystoid macular edema of left eye  H35.352   5. Cystoid macular edema of right eye  H35.351     1.  Persistent cystoid type appearances on OCT.  With recent difficulties using CPAP mask will need to study to look for progression of  MAC-TEL.  2.  Patient is adjusting the mask I do encourage efforts be completed to maximize the capability of using the mask to deliver the most oxygen possible throughout the night  3.  Ophthalmic Meds Ordered this visit:  No orders of the defined types were placed in this encounter.      Return in about 3 weeks (around 05/21/2021) for DILATE OU, OCT, OPTOS FFA L/R, COLOR FP.  There are no Patient Instructions on file for this visit.   Explained the diagnoses, plan, and follow up with the patient and they expressed understanding.  Patient expressed understanding of the importance of proper follow up care.   Clent Demark Dawayne Ohair M.D. Diseases & Surgery of the Retina and Vitreous [facility] 04/30/21     Abbreviations: M myopia (nearsighted); A astigmatism; H hyperopia (farsighted); P presbyopia; Mrx spectacle prescription;  CTL contact lenses; OD right eye; OS left eye; OU both eyes  XT exotropia; ET esotropia; PEK punctate epithelial keratitis; PEE punctate epithelial erosions; DES dry eye syndrome; MGD meibomian gland dysfunction; ATs artificial tears; PFAT's preservative free artificial tears; [place] nuclear sclerotic cataract; PSC posterior subcapsular cataract; ERM epi-retinal membrane; PVD posterior vitreous detachment; RD retinal detachment; DM diabetes mellitus; DR diabetic retinopathy; NPDR non-proliferative diabetic retinopathy; PDR proliferative diabetic retinopathy; CSME clinically significant macular edema; DME diabetic macular edema; dbh dot blot hemorrhages; CWS cotton wool spot; POAG primary open angle glaucoma; C/D cup-to-disc ratio; HVF humphrey visual field; GVF goldmann visual field; OCT optical coherence tomography; IOP intraocular pressure; BRVO Branch retinal vein occlusion; CRVO central retinal vein occlusion; CRAO central retinal artery occlusion; BRAO branch retinal artery occlusion; RT retinal tear; SB scleral buckle; PPV pars plana vitrectomy; VH  Vitreous hemorrhage; PRP panretinal laser photocoagulation; IVK intravitreal kenalog; VMT vitreomacular traction; MH Macular hole;  NVD neovascularization of the disc; NVE neovascularization elsewhere; AREDS age related eye disease study; ARMD age related macular degeneration; POAG primary open angle glaucoma; EBMD epithelial/anterior basement membrane dystrophy; ACIOL anterior chamber intraocular lens; IOL intraocular lens; PCIOL posterior chamber intraocular lens; Phaco/IOL phacoemulsification with intraocular lens placement; [place] photorefractive keratectomy; LASIK laser assisted in situ keratomileusis; HTN hypertension; DM diabetes mellitus; COPD chronic obstructive pulmonary disease

## 2021-05-09 ENCOUNTER — Other Ambulatory Visit: Payer: Self-pay

## 2021-05-09 ENCOUNTER — Emergency Department
Admission: EM | Admit: 2021-05-09 | Discharge: 2021-05-09 | Disposition: A | Discharge status: Home / Self Care | Payer: Medicare Other | Source: Home / Self Care

## 2021-05-09 DIAGNOSIS — B2790 Infectious mononucleosis, unspecified without complication: Secondary | ICD-10-CM

## 2021-05-09 LAB — POCT MONO SCREEN (KUC): Mono, POC: POSITIVE — AB

## 2021-05-09 NOTE — ED Triage Notes (Signed)
Pt presents to Urgent Care with c/o sore throat, cough, and sneezing x several days. Reports that her grandson (who lives with her) was just dx w/ mono  today. Has not taken a COVID test; is not vaccinated.

## 2021-05-09 NOTE — ED Provider Notes (Signed)
[name]    CSN: [id number] Arrival date & time: 05/09/21  1618      History   Chief Complaint Chief Complaint  Patient presents with   Sore Throat   Cough    HPI Kristine [name] is a 80 y.o. female.   Patient presents with concerns of possible mono. The patient's grandson, who lives with her, was seen earlier today and tested positive for mono. The patient reports mild sore throat, cough, and nasal congestion for the past few days. She denies known fever, headache, body aches, n/v/d, or difficulty breathing. She has not taken anything for her symptoms.   The history is provided by the patient.  Sore Throat Pertinent negatives include no chest pain, no headaches and no shortness of breath.  Cough Associated symptoms: sore throat   Associated symptoms: no chest pain, no fever, no headaches, no myalgias, no rash and no shortness of breath    Past Medical History:  Diagnosis Date   Anemia    Arthritis    Barrett's esophagus    Stage B   CAD (coronary artery disease)    a. s/p STEMI 7/14 >> LHC: LM 20, mLAD 30-40, small Dx 100 (L-L collats), OM1 90, OM2 30-40, mRCA 50-60, Lat/AL akinesis, EF 35-40% >> PCI: Promus DES to OM1;  b. Myoview 3/16: diaph atten, no ischemia, EF 59% (intermediate risk b/c BP drop 152>>137, but recovery BP 162 (tech error) >> med Rx continued  c. cath 05/30/17 s/p FFR guided PCI with DES to circumflex/first OM    COVID-19 virus infection    CREST variant of scleroderma (Kristine [name])    Cystocele    Diverticulitis    Event monitor 04/2019   Event monitor 04/2019: Sinus rhythm; no atrial fibrillation/flutter; no bradycardic events; few PVCs and occasional ventricular runs up to 9 beats   GERD (gastroesophageal reflux disease)    History of blood transfusion    hx of being Anti-K positive   History of Doppler ultrasound    a. Carotid US 2/12: bilateral ICA < 50%   Hyperlipidemia    Hypertension    Ischemic cardiomyopathy    a. EF 35-40% by LHC at  time of MI;  b. Echo 7/14:  Mild focal basal septal hypertrophy, EF 50-55%, apical HK, grade 1 diastolic dysfunction, trivial AI    Ischemic colitis (Kristine [name])    Orthostatic hypotension    OSA on CPAP    Osteoporosis 12/2017   T score -2.6   Raynaud disease    Rectocele    mild    Patient Active Problem List   Diagnosis Date Noted   Cystoid macular edema of left eye 10/11/2020   Gastroesophageal reflux disease with esophagitis 10/09/2020   MI (myocardial infarction) ([place]) 10/09/2020   COVID-19 virus infection 05/04/2020   Type 2 macular telangiectasis, right 03/20/2020   Cystoid macular edema of right eye 03/20/2020   Type 2 macular telangiectasis, left 03/20/2020   Obstructive sleep apnea, adult 03/20/2020   Cr(e)st syndrome (Kristine [name]) 05/25/2018   Ulcer of esophagus with bleeding 02/27/2018   Drug-induced constipation 02/24/2018   Psychophysiological insomnia 09/24/2017   Abnormal nuclear cardiac imaging test 05/30/2017   Abnormal stress echo    Solar lentigo 11/08/2016   Acute blood loss anemia 10/10/2015   Colitis 10/08/2015   Red blood cell antibody positive 10/04/2015   CAD (coronary artery disease), native coronary artery 06/27/2013   Hyperlipidemia 06/27/2013   Chest pain of uncertain etiology 19/50/9326   History of ST  elevation myocardial infarction (STEMI) 06/24/2013   Hypertension    Great toe pain 09/11/2012   H/O foot surgery 04/09/2012   Hallux varus (acquired) 02/25/2012   GERD (gastroesophageal reflux disease)    Barrett's esophagus    Cystocele    Rectocele     Past Surgical History:  Procedure Laterality Date   ABDOMINAL HYSTERECTOMY  1996   RSO for menorrhagia.   CORONARY ANGIOPLASTY WITH STENT PLACEMENT  06/21/2013   CORONARY STENT INTERVENTION N/A 05/30/2017   Procedure: Coronary Stent Intervention;  Surgeon: [name], MD;  Location: [facility];  Service: Cardiovascular;  Laterality: N/A;   FOOT SURGERY  2013   x2   INTRAVASCULAR PRESSURE  WIRE/FFR STUDY N/A 05/30/2017   Procedure: Intravascular Pressure Wire/FFR Study;  Surgeon: [name], MD;  Location: [facility];  Service: Cardiovascular;  Laterality: N/A;   JOINT REPLACEMENT  9/10   rt total   LEFT HEART CATH AND CORONARY ANGIOGRAPHY N/A 05/30/2017   Procedure: Left Heart Cath and Coronary Angiography;  Surgeon: [name], MD;  Location: [facility];  Service: Cardiovascular;  Laterality: N/A;   nissen fundoplication  3220   ROTATOR CUFF REPAIR     TONSILLECTOMY  1950   TOTAL KNEE ARTHROPLASTY     right   TUBAL LIGATION  1980    OB History     Gravida  1   Para  1   Term      Preterm      AB      Living  1      SAB      IAB      Ectopic      Multiple      Live Births               Home Medications    Prior to Admission medications   Medication Sig Start Date End Date Taking? Authorizing Provider  acetaminophen (TYLENOL) 500 MG tablet Take 1,000 mg by mouth every 6 (six) hours as needed for headache.    [provider]  albuterol (VENTOLIN HFA) 108 (90 Base) MCG/ACT inhaler Inhale 1-2 puffs into the lungs every 6 (six) hours as needed for wheezing or shortness of breath. 04/18/20   Noe Gens, PA-C  ALPRAZolam Duanne Moron) 0.25 MG tablet Take 1 tablet (0.25 mg total) by mouth daily as needed for anxiety. 09/17/17   Wardell Honour, MD  amoxicillin (AMOXIL) 500 MG tablet Take 500 mg by mouth as needed. Per patient taking (4) tablets 1 hour prior to dental procedure.    [provider]  aspirin EC 81 MG EC tablet Take 1 tablet (81 mg total) by mouth daily. 06/24/13   Barrett, Evelene Croon, PA-C  cetirizine (ZYRTEC) 5 MG tablet Take 1 tablet (5 mg total) by mouth daily. 10/09/20   Noe Gens, PA-C  Cholecalciferol (VITAMIN D3) 125 MCG (5000 UT) CAPS Take by mouth.    [provider]  ezetimibe (ZETIA) 10 MG tablet TAKE 1/2 TABLET EVERY DAY 04/25/21   [name], MD  famotidine (PEPCID) 40 MG  tablet Take 1 tablet by mouth at bedtime. 12/21/18   [provider]  fluticasone (FLONASE) 50 MCG/ACT nasal spray Place 2 sprays into both nostrils daily. 04/18/20   Noe Gens, PA-C  ipratropium (ATROVENT) 0.06 % nasal spray Place 2 sprays into both nostrils 4 (four) times daily. 10/09/20   Noe Gens, PA-C  magnesium 30 MG tablet Take  30 mg by mouth daily at 2 PM.    [provider]  nitroGLYCERIN (NITROSTAT) 0.4 MG SL tablet Place 1 tablet (0.4 mg total) under the tongue every 5 (five) minutes x 3 doses as needed for chest pain. 09/08/20   [name], MD  pantoprazole (PROTONIX) 40 MG tablet Take 40 mg by mouth daily.    [provider]  pravastatin (PRAVACHOL) 20 MG tablet Take 1 tablet (20 mg total) by mouth every evening. 04/25/21   [name], MD  Probiotic Product (PROBIOTIC-10 ULTIMATE PO) Take by mouth.    [provider]  valACYclovir (VALTREX) 1000 MG tablet Take 1,000 mg by mouth 2 (two) times daily. 02/03/21   [provider]    Family History Family History  Problem Relation Age of Onset   Cancer Mother        Pancreatic Cancer   Hepatitis C Mother    Heart disease Father        congestive heart failure   Heart attack Father    Panic disorder Daughter    Hypertension Daughter    Asthma Maternal Grandmother    Emphysema Maternal Grandmother    Cancer Maternal Grandfather        lung   Stroke Neg Hx     Social History Social History   Tobacco Use   Smoking status: Never   Smokeless tobacco: Never  Vaping Use   Vaping Use: Never used  Substance Use Topics   Alcohol use: No    Alcohol/week: 0.0 standard drinks   Drug use: No     Allergies   Aspirin, Pravastatin, Nsaids, Pravastatin sodium, and Statins   Review of Systems Review of Systems  Constitutional:  Negative for fatigue and fever.  HENT:  Positive for congestion and sore throat. Negative for sinus pressure and trouble swallowing.    Respiratory:  Positive for cough. Negative for chest tightness and shortness of breath.   Cardiovascular:  Negative for chest pain.  Gastrointestinal:  Negative for diarrhea, nausea and vomiting.  Musculoskeletal:  Negative for myalgias.  Skin:  Negative for rash.  Neurological:  Negative for headaches.    Physical Exam Triage Vital Signs ED Triage Vitals  Enc Vitals Group     BP 05/09/21 1637 (!) 148/83     Pulse Rate 05/09/21 1637 91     Resp 05/09/21 1637 20     Temp 05/09/21 1637 99.3 F (37.4 C)     Temp Source 05/09/21 1637 Oral     SpO2 05/09/21 1637 99 %     Weight 05/09/21 1635 122 lb (55.3 kg)     Height 05/09/21 1635 5' (1.524 m)     Head Circumference --      Peak Flow --      Pain Score 05/09/21 1635 2     Pain Loc --      Pain Edu? --      Excl. in [place]? --    No data found.  Updated Vital Signs BP (!) 148/83   Pulse 91   Temp 99.3 F (37.4 C) (Oral)   Resp 20   Ht 5' (1.524 m)   Wt 122 lb (55.3 kg)   LMP 11/25/1994   SpO2 99%   BMI 23.83 kg/m   Visual Acuity Right Eye Distance:   Left Eye Distance:   Bilateral Distance:    Right Eye Near:   Left Eye Near:    Bilateral Near:     Physical Exam  Vitals and nursing note reviewed.  Constitutional:      General: She is not in acute distress. HENT:     Head: Normocephalic and atraumatic.     Nose: Congestion present.     Mouth/Throat:     Mouth: Mucous membranes are moist.     Pharynx: Oropharynx is clear. No oropharyngeal exudate or posterior oropharyngeal erythema.  Eyes:     Conjunctiva/sclera: Conjunctivae normal.     Pupils: Pupils are equal, round, and reactive to light.  Cardiovascular:     Rate and Rhythm: Normal rate and regular rhythm.     Heart sounds: Normal heart sounds.  Pulmonary:     Effort: Pulmonary effort is normal.     Breath sounds: Normal breath sounds.  Musculoskeletal:     Cervical back: Normal range of motion.  Lymphadenopathy:     Cervical: No cervical  adenopathy.  Skin:    Findings: No rash.  Neurological:     Mental Status: She is alert.  Psychiatric:        Mood and Affect: Mood normal.     UC Treatments / Results  Labs (all labs ordered are listed, but only abnormal results are displayed) Labs Reviewed  POCT MONO SCREEN ([facility]) - Abnormal; Notable for the following components:      Result Value   Mono, POC Positive (*)    All other components within normal limits    EKG   Radiology No results found.  Procedures Procedures (including critical care time)  Medications Ordered in UC Medications - No data to display  Initial Impression / Assessment and Plan / UC Course  I have reviewed the triage vital signs and the nursing notes.  Pertinent labs & imaging results that were available during my care of the patient were reviewed by me and considered in my medical decision making (see chart for details).     Mono+. Sx tx as needed. Discussed precautions for followup.   E/M: 1 acute uncomplicated illness, 1 data (mono), low risk   Final Clinical Impressions(s) / UC Diagnoses   Final diagnoses:  Infectious mononucleosis without complication, infectious mononucleosis due to unspecified organism     Discharge Instructions      Take OTC medicine as needed for symptoms. Rest and keep hydrated. Avoid vigorous exercise for a few weeks. Follow-up with PCP if persistent symptoms or concerns.     ED Prescriptions   None    PDMP not reviewed this encounter.   Delsa Sale, [place] 05/09/21 1727

## 2021-05-09 NOTE — Discharge Instructions (Addendum)
Take OTC medicine as needed for symptoms. Rest and keep hydrated. Avoid vigorous exercise for a few weeks. Follow-up with PCP if persistent symptoms or concerns.

## 2021-05-10 DIAGNOSIS — G4733 Obstructive sleep apnea (adult) (pediatric): Secondary | ICD-10-CM | POA: Diagnosis not present

## 2021-05-21 ENCOUNTER — Encounter (INDEPENDENT_AMBULATORY_CARE_PROVIDER_SITE_OTHER): Payer: Medicare Other | Admitting: Ophthalmology

## 2021-05-23 ENCOUNTER — Ambulatory Visit: Payer: Medicare Other | Admitting: Family Medicine

## 2021-05-24 ENCOUNTER — Other Ambulatory Visit: Payer: Self-pay

## 2021-05-24 ENCOUNTER — Ambulatory Visit (INDEPENDENT_AMBULATORY_CARE_PROVIDER_SITE_OTHER): Payer: Medicare Other | Admitting: Family Medicine

## 2021-05-24 ENCOUNTER — Encounter: Payer: Self-pay | Admitting: Family Medicine

## 2021-05-24 VITALS — BP 123/65 | HR 108 | Ht 61.0 in | Wt 125.0 lb

## 2021-05-24 DIAGNOSIS — I10 Essential (primary) hypertension: Secondary | ICD-10-CM | POA: Diagnosis not present

## 2021-05-24 DIAGNOSIS — M899 Disorder of bone, unspecified: Secondary | ICD-10-CM

## 2021-05-24 DIAGNOSIS — B2790 Infectious mononucleosis, unspecified without complication: Secondary | ICD-10-CM

## 2021-05-24 DIAGNOSIS — Z8739 Personal history of other diseases of the musculoskeletal system and connective tissue: Secondary | ICD-10-CM | POA: Diagnosis not present

## 2021-05-24 DIAGNOSIS — E538 Deficiency of other specified B group vitamins: Secondary | ICD-10-CM | POA: Diagnosis not present

## 2021-05-24 DIAGNOSIS — I2510 Atherosclerotic heart disease of native coronary artery without angina pectoris: Secondary | ICD-10-CM | POA: Diagnosis not present

## 2021-05-24 DIAGNOSIS — K219 Gastro-esophageal reflux disease without esophagitis: Secondary | ICD-10-CM

## 2021-05-24 DIAGNOSIS — R5383 Other fatigue: Secondary | ICD-10-CM | POA: Diagnosis not present

## 2021-05-24 NOTE — Patient Instructions (Signed)
Very nice to meet you today! We'll be in touch with lab results.

## 2021-05-25 LAB — COMPLETE METABOLIC PANEL WITH GFR
AG Ratio: 1.8 (calc) (ref 1.0–2.5)
ALT: 10 U/L (ref 6–29)
AST: 16 U/L (ref 10–35)
Albumin: 4.1 g/dL (ref 3.6–5.1)
Alkaline phosphatase (APISO): 102 U/L (ref 37–153)
BUN: 14 mg/dL (ref 7–25)
CO2: 27 mmol/L (ref 20–32)
Calcium: 9.6 mg/dL (ref 8.6–10.4)
Chloride: 105 mmol/L (ref 98–110)
Creat: 0.75 mg/dL (ref 0.60–0.93)
GFR, Est African American: 88 mL/min/{1.73_m2} (ref >=60)
GFR, Est Non African American: 76 mL/min/{1.73_m2} (ref >=60)
Globulin: 2.3 g/dL (calc) (ref 1.9–3.7)
Glucose, Bld: 85 mg/dL (ref 65–99)
Potassium: 4.7 mmol/L (ref 3.5–5.3)
Sodium: 142 mmol/L (ref 135–146)
Total Bilirubin: 0.3 mg/dL (ref 0.2–1.2)
Total Protein: 6.4 g/dL (ref 6.1–8.1)

## 2021-05-25 LAB — CBC WITH DIFFERENTIAL/PLATELET
Absolute Monocytes: 983 cells/uL — ABNORMAL HIGH (ref 200–950)
Basophils Absolute: 47 cells/uL (ref 0–200)
Basophils Relative: 0.6 %
Eosinophils Absolute: 234 cells/uL (ref 15–500)
Eosinophils Relative: 3 %
HCT: 38.3 % (ref 35.0–45.0)
Hemoglobin: 11.8 g/dL (ref 11.7–15.5)
Lymphs Abs: 1989 cells/uL (ref 850–3900)
MCH: 25.1 pg — ABNORMAL LOW (ref 27.0–33.0)
MCHC: 30.8 g/dL — ABNORMAL LOW (ref 32.0–36.0)
MCV: 81.5 fL (ref 80.0–100.0)
MPV: 10.9 fL (ref 7.5–12.5)
Monocytes Relative: 12.6 %
Neutro Abs: 4547 cells/uL (ref 1500–7800)
Neutrophils Relative %: 58.3 %
Platelets: 193 10*3/uL (ref 140–400)
RBC: 4.7 10*6/uL (ref 3.80–5.10)
RDW: 14.4 % (ref 11.0–15.0)
Total Lymphocyte: 25.5 %
WBC: 7.8 10*3/uL (ref 3.8–10.8)

## 2021-05-25 LAB — LIPID PANEL W/REFLEX DIRECT LDL
Cholesterol: 172 mg/dL (ref <200)
HDL: 54 mg/dL (ref >=50)
LDL Cholesterol (Calc): 90 mg/dL (calc)
Non-HDL Cholesterol (Calc): 118 mg/dL (calc) (ref <130)
Total CHOL/HDL Ratio: 3.2 (calc) (ref <5.0)
Triglycerides: 189 mg/dL — ABNORMAL HIGH (ref <150)

## 2021-05-25 LAB — VITAMIN B12: Vitamin B-12: 457 pg/mL (ref 200–1100)

## 2021-05-25 LAB — VITAMIN D 25 HYDROXY (VIT D DEFICIENCY, FRACTURES): Vit D, 25-Hydroxy: 41 ng/mL (ref 30–100)

## 2021-05-25 LAB — TSH: TSH: 2.3 mIU/L (ref 0.40–4.50)

## 2021-05-27 ENCOUNTER — Encounter: Payer: Self-pay | Admitting: Family Medicine

## 2021-05-27 DIAGNOSIS — B2790 Infectious mononucleosis, unspecified without complication: Secondary | ICD-10-CM | POA: Insufficient documentation

## 2021-05-27 DIAGNOSIS — Z8739 Personal history of other diseases of the musculoskeletal system and connective tissue: Secondary | ICD-10-CM | POA: Insufficient documentation

## 2021-05-27 NOTE — Assessment & Plan Note (Signed)
Checking vitamin D level today.  

## 2021-05-27 NOTE — Assessment & Plan Note (Signed)
Denies any anginal symptoms at this time.  She continues to see cardiology on a regular basis.  She will continue aspirin as well as statin.

## 2021-05-27 NOTE — Assessment & Plan Note (Signed)
Hypertension is well managed without medications.  We will continue to monitor clinically.

## 2021-05-27 NOTE — Assessment & Plan Note (Signed)
Management per GI.  Stable at this time. 

## 2021-05-27 NOTE — Assessment & Plan Note (Signed)
She has not had any significant complications related to this.  She does have some continued fatigue.

## 2021-05-27 NOTE — Progress Notes (Signed)
Kristine [name] - 80 y.o. female MRN [id number]  Date of birth: 24-Jun-1941  Subjective No chief complaint on file.   HPI Kristine [name] is a 80 year old female here today for initial visit to establish care.  She has a prior history of hypertension, coronary artery disease status post MI with stent, GERD, hyperlipidemia and macular degeneration.  She was recently seen in our urgent care and diagnosed with mononucleosis.  She was exposed to mono by her grandson.  She seems to be doing pretty well.  She does have some fatigue as well as feeling of fullness under her left rib cage area.  She denies pain in this area.  She has not had any continued fever, chills, chest pain or shortness of breath.  Sore throat has improved.  In regards to her history of hypertension and coronary artery disease.  She has seen regularly by Dr. [name] in cardiology.  She is status post STEMI with PCI/stent to OM in 2014.  She has not been tolerant of higher potency statins, currently on pravastatin 20 mg as well as Zetia.  Her blood pressure has been well controlled without medication.  She denies any anginal symptoms.  She does do ballroom dancing for exercise.  No problems with tolerating this.  She is due for updated labs.  In reviewing some of her previous history I did find that she had a bone density scan consistent with osteoporosis as well.  ROS:  A comprehensive ROS was completed and negative except as noted per HPI   Allergies  Allergen Reactions   Aspirin Other (See Comments)    Other reaction(s): Other Can take 81 mg but not aspirin based medications Pt is prone to bleeding, ok with low dose coated aspirin   Pravastatin     Other reaction(s): Other Other reaction(s): Other (See Comments) elevated LFT'S, tolerates 1/2 dose of zetia and 1/2 dose of pravastatin   Nsaids Other (See Comments)    Pt is prone to bleeding   Pravastatin Sodium     Other reaction(s): Other (See Comments) elevated LFT'S,  tolerates 1/2 dose of zetia and 1/2 dose of pravastatin   Statins Other (See Comments)    elevated LFT'S, tolerates 1/2 dose of zetia and 1/2 dose of pravastatin    Past Medical History:  Diagnosis Date   Anemia    Arthritis    Barrett's esophagus    Stage B   CAD (coronary artery disease)    a. s/p STEMI 7/14 >> LHC: LM 20, mLAD 30-40, small Dx 100 (L-L collats), OM1 90, OM2 30-40, mRCA 50-60, Lat/AL akinesis, EF 35-40% >> PCI: Promus DES to OM1;  b. Myoview 3/16: diaph atten, no ischemia, EF 59% (intermediate risk b/c BP drop 152>>137, but recovery BP 162 (tech error) >> med Rx continued  c. cath 05/30/17 s/p FFR guided PCI with DES to circumflex/first OM    COVID-19 virus infection    CREST variant of scleroderma ([place])    Cystocele    Diverticulitis    Event monitor 04/2019   Event monitor 04/2019: Sinus rhythm; no atrial fibrillation/flutter; no bradycardic events; few PVCs and occasional ventricular runs up to 9 beats   GERD (gastroesophageal reflux disease)    History of blood transfusion    hx of being Anti-K positive   History of Doppler ultrasound    a. Carotid US 2/12: bilateral ICA < 50%   Hyperlipidemia    Hypertension    Ischemic cardiomyopathy    a. EF 35-40% by  LHC at time of MI;  b. Echo 7/14:  Mild focal basal septal hypertrophy, EF 50-55%, apical HK, grade 1 diastolic dysfunction, trivial AI    Ischemic colitis ([place])    Orthostatic hypotension    OSA on CPAP    Osteoporosis 12/2017   T score -2.6   Raynaud disease    Rectocele    mild    Past Surgical History:  Procedure Laterality Date   ABDOMINAL HYSTERECTOMY  1996   RSO for menorrhagia.   CORONARY ANGIOPLASTY WITH STENT PLACEMENT  06/21/2013   CORONARY STENT INTERVENTION N/A 05/30/2017   Procedure: Coronary Stent Intervention;  Surgeon: [name], MD;  Location: [facility];  Service: Cardiovascular;  Laterality: N/A;   FOOT SURGERY  2013   x2   INTRAVASCULAR PRESSURE WIRE/FFR STUDY N/A  05/30/2017   Procedure: Intravascular Pressure Wire/FFR Study;  Surgeon: [name], MD;  Location: [facility];  Service: Cardiovascular;  Laterality: N/A;   JOINT REPLACEMENT  9/10   rt total   LEFT HEART CATH AND CORONARY ANGIOGRAPHY N/A 05/30/2017   Procedure: Left Heart Cath and Coronary Angiography;  Surgeon: [name], MD;  Location: [facility];  Service: Cardiovascular;  Laterality: N/A;   nissen fundoplication  5366   ROTATOR CUFF REPAIR     TONSILLECTOMY  1950   TOTAL KNEE ARTHROPLASTY     right   TUBAL LIGATION  1980    Social History   Socioeconomic History   Marital status: Divorced    Spouse name: Not on file   Number of children: Not on file   Years of education: Not on file   Highest education level: Not on file  Occupational History   Not on file  Tobacco Use   Smoking status: Never   Smokeless tobacco: Never  Vaping Use   Vaping Use: Never used  Substance and Sexual Activity   Alcohol use: No    Alcohol/week: 0.0 standard drinks   Drug use: No   Sexual activity: Never    Comment: 1st intercourse 67 yo-1 partner  Other Topics Concern   Not on file  Social History Narrative   Exercise dancing 3 x times weekly for 1-2 hours   Social Determinants of Health   Financial Resource Strain: Not on file  Food Insecurity: Not on file  Transportation Needs: Not on file  Physical Activity: Not on file  Stress: Not on file  Social Connections: Not on file    Family History  Problem Relation Age of Onset   Cancer Mother        Pancreatic Cancer   Hepatitis C Mother    Heart disease Father        congestive heart failure   Heart attack Father    Panic disorder Daughter    Hypertension Daughter    Asthma Maternal Grandmother    Emphysema Maternal Grandmother    Cancer Maternal Grandfather        lung   Stroke Neg Hx     Health Maintenance  Topic Date Due   COVID-19 Vaccine (1) Never done   Hepatitis C Screening  Never done    TETANUS/TDAP  Never done   Zoster Vaccines- Shingrix (1 of 2) Never done   PNA vac Low Risk Adult (2 of 2 - PPSV23) 11/22/2016   MAMMOGRAM  02/20/2021   INFLUENZA VACCINE  06/25/2021   DEXA SCAN  Completed   HPV VACCINES  Aged Out     -----------------------------------------------------------------------------------------------------------------------------------------------------------------------------------------------------------------  Physical Exam BP 123/65   Pulse (!) 108   Ht 5\' 1"  (1.549 m)   Wt 125 lb (56.7 kg)   LMP 11/25/1994   BMI 23.62 kg/m   Physical Exam Constitutional:      Appearance: Normal appearance.  HENT:     Head: Normocephalic and atraumatic.  Eyes:     General: No scleral icterus. Cardiovascular:     Rate and Rhythm: Normal rate and regular rhythm.  Pulmonary:     Effort: Pulmonary effort is normal.     Breath sounds: Normal breath sounds.  Abdominal:     General: Bowel sounds are normal. There is no distension.     Palpations: There is no mass.     Tenderness: There is no abdominal tenderness.  Musculoskeletal:     Cervical back: Neck supple.  Skin:    General: Skin is warm and dry.     Findings: No rash.  Neurological:     General: No focal deficit present.     Mental Status: She is alert.  Psychiatric:        Mood and Affect: Mood normal.        Behavior: Behavior normal.    ------------------------------------------------------------------------------------------------------------------------------------------------------------------------------------------------------------------- Assessment and Plan  Hypertension Hypertension is well managed without medications.  We will continue to monitor clinically.  CAD (coronary artery disease), native coronary artery Denies any anginal symptoms at this time.  She continues to see cardiology on a regular basis.  She will continue aspirin as well as statin.  GERD (gastroesophageal reflux  disease) Management per GI.  Stable at this time.  Mononucleosis She has not had any significant complications related to this.  She does have some continued fatigue.  History of osteoporosis Checking vitamin D level today.   No orders of the defined types were placed in this encounter.   No follow-ups on file.    This visit occurred during the SARS-CoV-2 public health emergency.  Safety protocols were in place, including screening questions prior to the visit, additional usage of staff PPE, and extensive cleaning of exam room while observing appropriate contact time as indicated for disinfecting solutions.

## 2021-05-31 ENCOUNTER — Encounter (INDEPENDENT_AMBULATORY_CARE_PROVIDER_SITE_OTHER): Payer: Self-pay | Admitting: Ophthalmology

## 2021-05-31 ENCOUNTER — Ambulatory Visit (INDEPENDENT_AMBULATORY_CARE_PROVIDER_SITE_OTHER): Payer: Medicare Other | Admitting: Ophthalmology

## 2021-05-31 ENCOUNTER — Other Ambulatory Visit: Payer: Self-pay

## 2021-05-31 DIAGNOSIS — Z961 Presence of intraocular lens: Secondary | ICD-10-CM | POA: Diagnosis not present

## 2021-05-31 DIAGNOSIS — H35071 Retinal telangiectasis, right eye: Secondary | ICD-10-CM

## 2021-05-31 DIAGNOSIS — H35072 Retinal telangiectasis, left eye: Secondary | ICD-10-CM

## 2021-05-31 MED ORDER — FLUORESCEIN SODIUM 10 % IV SOLN
500.0000 mg | INTRAVENOUS | Status: AC | PRN
  Administered 2021-05-31: 500 mg via INTRAVENOUS

## 2021-05-31 NOTE — Assessment & Plan Note (Signed)
Angiographic leakage defined today but not definable with CME by OCT or clinically

## 2021-05-31 NOTE — Assessment & Plan Note (Addendum)
As compared to last visit patient has been using CPAP now for the last 10 months and only for the last 1 month as she had success with a new type of strap maintaining use and all throughout the night without slippage.   No angiographic CME OD today with recent 1 month success with CPAP mask usage, due to proper fitting

## 2021-05-31 NOTE — Progress Notes (Signed)
05/31/2021     CHIEF COMPLAINT Patient presents for No chief complaint on file.   HISTORY OF PRESENT ILLNESS: Kristine [name] is a 80 y.o. female who presents to the clinic today for:     Referring physician: No referring provider defined for this encounter.  HISTORICAL INFORMATION:   Selected notes from the MEDICAL RECORD NUMBER       CURRENT MEDICATIONS: No current outpatient medications on file. (Ophthalmic Drugs)   No current facility-administered medications for this visit. (Ophthalmic Drugs)   Current Outpatient Medications (Other)  Medication Sig   acetaminophen (TYLENOL) 500 MG tablet Take 1,000 mg by mouth every 6 (six) hours as needed for headache.   albuterol (VENTOLIN HFA) 108 (90 Base) MCG/ACT inhaler Inhale 1-2 puffs into the lungs every 6 (six) hours as needed for wheezing or shortness of breath.   ALPRAZolam (XANAX) 0.25 MG tablet Take 1 tablet (0.25 mg total) by mouth daily as needed for anxiety.   amoxicillin (AMOXIL) 500 MG tablet Take 500 mg by mouth as needed. Per patient taking (4) tablets 1 hour prior to dental procedure.   aspirin EC 81 MG EC tablet Take 1 tablet (81 mg total) by mouth daily.   cetirizine (ZYRTEC) 5 MG tablet Take 1 tablet (5 mg total) by mouth daily.   Cholecalciferol (VITAMIN D3) 125 MCG (5000 UT) CAPS Take by mouth.   ezetimibe (ZETIA) 10 MG tablet TAKE 1/2 TABLET EVERY DAY   famotidine (PEPCID) 40 MG tablet Take 1 tablet by mouth at bedtime.   fluticasone (FLONASE) 50 MCG/ACT nasal spray Place 2 sprays into both nostrils daily.   ipratropium (ATROVENT) 0.06 % nasal spray Place 2 sprays into both nostrils 4 (four) times daily.   magnesium 30 MG tablet Take 30 mg by mouth daily at 2 PM.   nitroGLYCERIN (NITROSTAT) 0.4 MG SL tablet Place 1 tablet (0.4 mg total) under the tongue every 5 (five) minutes x 3 doses as needed for chest pain.   pantoprazole (PROTONIX) 40 MG tablet Take 40 mg by mouth daily.   pravastatin (PRAVACHOL) 20 MG  tablet Take 1 tablet (20 mg total) by mouth every evening.   Probiotic Product (PROBIOTIC-10 ULTIMATE PO) Take by mouth.   valACYclovir (VALTREX) 1000 MG tablet Take 1,000 mg by mouth 2 (two) times daily.   No current facility-administered medications for this visit. (Other)      REVIEW OF SYSTEMS:    ALLERGIES Allergies  Allergen Reactions   Aspirin Other (See Comments)    Other reaction(s): Other Can take 81 mg but not aspirin based medications Pt is prone to bleeding, ok with low dose coated aspirin   Pravastatin     Other reaction(s): Other Other reaction(s): Other (See Comments) elevated LFT'S, tolerates 1/2 dose of zetia and 1/2 dose of pravastatin   Nsaids Other (See Comments)    Pt is prone to bleeding   Pravastatin Sodium     Other reaction(s): Other (See Comments) elevated LFT'S, tolerates 1/2 dose of zetia and 1/2 dose of pravastatin   Statins Other (See Comments)    elevated LFT'S, tolerates 1/2 dose of zetia and 1/2 dose of pravastatin    PAST MEDICAL HISTORY Past Medical History:  Diagnosis Date   Anemia    Arthritis    Barrett's esophagus    Stage B   CAD (coronary artery disease)    a. s/p STEMI 7/14 >> LHC: LM 20, mLAD 30-40, small Dx 100 (L-L collats), OM1 90, OM2 30-40, mRCA  50-60, Lat/AL akinesis, EF 35-40% >> PCI: Promus DES to OM1;  b. Myoview 3/16: diaph atten, no ischemia, EF 59% (intermediate risk b/c BP drop 152>>137, but recovery BP 162 (tech error) >> med Rx continued  c. 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  Carotid US 2/12: bilateral ICA < 50%   Hyperlipidemia    Hypertension    Ischemic cardiomyopathy    a. EF 35-40% by LHC at time of MI;  b. Echo 7/14:  Mild focal basal septal hypertrophy, EF 50-55%, apical HK, grade 1 diastolic dysfunction, trivial AI    Ischemic colitis ([place])    Orthostatic hypotension    OSA on CPAP    Osteoporosis 12/2017   T score -2.6   Raynaud disease    Rectocele    mild   Past Surgical History:  Procedure Laterality Date   ABDOMINAL HYSTERECTOMY  1996   RSO for menorrhagia.   CORONARY ANGIOPLASTY WITH STENT PLACEMENT  06/21/2013   CORONARY STENT INTERVENTION N/A 05/30/2017   Procedure: Coronary Stent Intervention;  Surgeon: [name], MD;  Location: [facility];  Service: Cardiovascular;  Laterality: N/A;   FOOT SURGERY  2013   x2   INTRAVASCULAR PRESSURE WIRE/FFR STUDY N/A 05/30/2017   Procedure: Intravascular Pressure Wire/FFR Study;  Surgeon: [name], MD;  Location: [facility];  Service: Cardiovascular;  Laterality: N/A;   JOINT REPLACEMENT  9/10   rt total   LEFT HEART CATH AND CORONARY ANGIOGRAPHY N/A 05/30/2017   Procedure: Left Heart Cath and Coronary Angiography;  Surgeon: [name], MD;  Location: [facility];  Service: Cardiovascular;  Laterality: N/A;   nissen fundoplication  4097   ROTATOR CUFF REPAIR     TONSILLECTOMY  1950   TOTAL KNEE ARTHROPLASTY     right   TUBAL LIGATION  1980    FAMILY HISTORY Family History  Problem Relation Age of Onset   Cancer Mother        Pancreatic Cancer   Hepatitis C Mother    Heart disease Father        congestive heart failure   Heart attack Father    Panic disorder Daughter    Hypertension Daughter    Asthma Maternal Grandmother    Emphysema Maternal Grandmother    Cancer Maternal Grandfather        lung   Stroke Neg Hx     SOCIAL HISTORY Social History   Tobacco Use   Smoking status: Never   Smokeless tobacco: Never  Vaping Use   Vaping Use: Never  used  Substance Use Topics   Alcohol use: No    Alcohol/week: 0.0 standard drinks   Drug use: No         OPHTHALMIC EXAM:  Base Eye Exam     Visual Acuity (ETDRS)       Right Left   Dist [place] 20/20 20/15 -2         Tonometry (Tonopen, 3:43 PM)  Right Left   Pressure 20 18         Neuro/Psych     Oriented x3: Yes   Mood/Affect: Normal           Slit Lamp and Fundus Exam     External Exam       Right Left   External Normal Normal         Slit Lamp Exam       Right Left   Lids/Lashes Normal Normal   Conjunctiva/Sclera White and quiet White and quiet   Cornea Clear Clear   Anterior Chamber Deep and quiet Deep and quiet   Iris Round and reactive Round and reactive   Lens Posterior chamber intraocular lens Posterior chamber intraocular lens   Anterior Vitreous Normal Normal         Fundus Exam       Right Left   Posterior Vitreous Normal Normal   Disc Normal Normal   C/D Ratio 0.3 0.55   Macula no macular thickening no macular thickening, Microaneurysms   Vessels Normal Normal   Periphery Normal Normal            IMAGING AND PROCEDURES  Imaging and Procedures for 05/31/21  Fluorescein Angiography Optos (Transit OD)       Injection: 500 mg Fluorescein Sodium 10 %   Route: Intravenous   NDC: 8416-6063-01   Right Eye   Progression has improved. Early phase findings include normal observations. Mid/Late phase findings include normal observations. Choroidal neovascularization is not present.   Left Eye   Progression has improved. Choroidal neovascularization is not present.   Notes OD, no definable angiographic leakage and a parafoveal location nasal to the fovea that would correspond to the OCT finding.  No residual findings of active MAC-TEL  OS with very late blush angiographically that may be old MAC-TEL but not actively leaking currently     OCT, Retina - OU - Both Eyes       Right Eye Quality was good.  Scan locations included subfoveal. Central Foveal Thickness: 297. Progression has been stable. Findings include cystoid macular edema.   Left Eye Quality was good. Scan locations included subfoveal. Central Foveal Thickness: 301. Progression has been stable.   Notes Mild perifoveal CME slightly improved only   9 months status post onset use of CPAP, yet recent poor CPAP mask fit has improved over the last 2 weeks  Persistent perifoveal schisis type residual from previous active macular retinal schisis.  May need fluorescein angiography in the near future.     Color Fundus Photography Optos - OU - Both Eyes       Right Eye Progression has been stable. Disc findings include normal observations. Macula : normal observations. Vessels : normal observations. Periphery : normal observations.   Left Eye Progression has been stable. Disc findings include normal observations. Macula : drusen, normal observations. Vessels : normal observations. Periphery : normal observations.   Notes No discernible microaneurysms nor CME on color fundus photography             ASSESSMENT/PLAN:  Type 2 macular telangiectasis, right As compared to last visit patient has been using CPAP now for the last 10 months and only for the last 1 month as she had success with a new type of strap maintaining use and all throughout the night without slippage.   No angiographic CME OD today with recent 1 month success with CPAP mask usage, due to proper fitting  Type 2  macular telangiectasis, left Angiographic leakage defined today but not definable with CME by OCT or clinically     ICD-10-CM   1. Type 2 macular telangiectasis, right  H35.071 Fluorescein Angiography Optos (Transit OD)    OCT, Retina - OU - Both Eyes    Color Fundus Photography Optos - OU - Both Eyes    Fluorescein Sodium 10 % injection 500 mg    2. Type 2 macular telangiectasis, left  H35.072 OCT, Retina - OU - Both Eyes    Color Fundus  Photography Optos - OU - Both Eyes    3. Pseudophakia of left eye  Z96.1     4. Pseudophakia of right eye  Z96.1       1.  OU stable no active MAC-TEL and resolved as compared to initial onset 2018.  We will continue to monitor and look for worsening since the patient had mask issues for some period of time prior to resolution over the last 1 month.  2.  3.  Ophthalmic Meds Ordered this visit:  Meds ordered this encounter  Medications   Fluorescein Sodium 10 % injection 500 mg       Return in about 6 months (around 12/01/2021) for DILATE OU, OCT.  There are no Patient Instructions on file for this visit.   Explained the diagnoses, plan, and follow up with the patient and they expressed understanding.  Patient expressed understanding of the importance of proper follow up care.   Clent Demark Jorgeluis Gurganus M.D. Diseases & Surgery of the Retina and Vitreous Retina & Diabetic [place] 05/31/21     Abbreviations: M myopia (nearsighted); A astigmatism; H hyperopia (farsighted); P presbyopia; Mrx spectacle prescription;  CTL contact lenses; OD right eye; OS left eye; OU both eyes  XT exotropia; ET esotropia; PEK punctate epithelial keratitis; PEE punctate epithelial erosions; DES dry eye syndrome; MGD meibomian gland dysfunction; ATs artificial tears; PFAT's preservative free artificial tears; [place] nuclear sclerotic cataract; PSC posterior subcapsular cataract; ERM epi-retinal membrane; PVD posterior vitreous detachment; RD retinal detachment; DM diabetes mellitus; DR diabetic retinopathy; NPDR non-proliferative diabetic retinopathy; PDR proliferative diabetic retinopathy; CSME clinically significant macular edema; DME diabetic macular edema; dbh dot blot hemorrhages; CWS cotton wool spot; POAG primary open angle glaucoma; C/D cup-to-disc ratio; HVF humphrey visual field; GVF goldmann visual field; OCT optical coherence tomography; IOP intraocular pressure; BRVO Branch retinal vein occlusion;  CRVO central retinal vein occlusion; CRAO central retinal artery occlusion; BRAO branch retinal artery occlusion; RT retinal tear; SB scleral buckle; PPV pars plana vitrectomy; VH Vitreous hemorrhage; PRP panretinal laser photocoagulation; IVK intravitreal kenalog; VMT vitreomacular traction; MH Macular hole;  NVD neovascularization of the disc; NVE neovascularization elsewhere; AREDS age related eye disease study; ARMD age related macular degeneration; POAG primary open angle glaucoma; EBMD epithelial/anterior basement membrane dystrophy; ACIOL anterior chamber intraocular lens; IOL intraocular lens; PCIOL posterior chamber intraocular lens; Phaco/IOL phacoemulsification with intraocular lens placement; [place] photorefractive keratectomy; LASIK laser assisted in situ keratomileusis; HTN hypertension; DM diabetes mellitus; COPD chronic obstructive pulmonary disease

## 2021-06-05 DIAGNOSIS — K219 Gastro-esophageal reflux disease without esophagitis: Secondary | ICD-10-CM | POA: Diagnosis not present

## 2021-06-05 DIAGNOSIS — M341 CR(E)ST syndrome: Secondary | ICD-10-CM | POA: Diagnosis not present

## 2021-06-05 DIAGNOSIS — R140 Abdominal distension (gaseous): Secondary | ICD-10-CM | POA: Diagnosis not present

## 2021-06-05 DIAGNOSIS — K5900 Constipation, unspecified: Secondary | ICD-10-CM | POA: Diagnosis not present

## 2021-06-06 DIAGNOSIS — L814 Other melanin hyperpigmentation: Secondary | ICD-10-CM | POA: Diagnosis not present

## 2021-06-06 DIAGNOSIS — D485 Neoplasm of uncertain behavior of skin: Secondary | ICD-10-CM | POA: Diagnosis not present

## 2021-06-06 DIAGNOSIS — D1801 Hemangioma of skin and subcutaneous tissue: Secondary | ICD-10-CM | POA: Diagnosis not present

## 2021-06-06 DIAGNOSIS — L570 Actinic keratosis: Secondary | ICD-10-CM | POA: Diagnosis not present

## 2021-06-06 DIAGNOSIS — D225 Melanocytic nevi of trunk: Secondary | ICD-10-CM | POA: Diagnosis not present

## 2021-06-06 DIAGNOSIS — L821 Other seborrheic keratosis: Secondary | ICD-10-CM | POA: Diagnosis not present

## 2021-06-28 DIAGNOSIS — G4733 Obstructive sleep apnea (adult) (pediatric): Secondary | ICD-10-CM | POA: Diagnosis not present

## 2021-06-29 ENCOUNTER — Telehealth: Payer: Self-pay | Admitting: Cardiovascular Disease

## 2021-06-29 NOTE — Telephone Encounter (Signed)
Pt c/o BP issue: STAT if pt c/o blurred vision, one-sided weakness or slurred speech  1. What are your last 5 BP readings? 144/69 P72 this morning   2. Are you having any other symptoms (ex. Dizziness, headache, blurred vision, passed out)? Lightheadedness since yesterday  3. What is your BP issue? Pt states her BP have been high for the last 24 hours

## 2021-06-29 NOTE — Telephone Encounter (Signed)
Pt calling in to report light headedness yesterday and recently.  Currently no dizziness. Reports that she sat most of the day yesterday due to this. BPs yesterday:  113/60, HR 64  121/65, HR 60 This morning 144/69, HR 72 Reviewed medications with patient.  Did not see BP medications on her list, pt reports she is taking Toprol 12.5 mg daily. Pt advised that she was instructed to stop this medication 01/2021 per our office d/t same above reported issue. We also performed orthostatic VS in March d/t issue -- testing negative. Pt will stop medication to see if improvement in light headedness.   Advised to monitor BPs daily at home. If continues after stopping, she will follow up w/ PCP for further workup on this matter. Patient verbalized understanding and agreeable to plan.   Forwarding to MD for his [facility]

## 2021-07-01 NOTE — Telephone Encounter (Signed)
Agree thank you 

## 2021-07-03 ENCOUNTER — Telehealth: Payer: Self-pay | Admitting: Family Medicine

## 2021-07-03 ENCOUNTER — Telehealth: Payer: Self-pay | Admitting: Cardiovascular Disease

## 2021-07-03 NOTE — Telephone Encounter (Signed)
Pt calling to report continued light headedness. She states when she bends over, she gets dizzy. She isnt sure if it is related to BP, but her BP has been elevated this week (see original phone note). She is not taking her metoprolol per recommendation.  I advised her to call her PCP to discuss symptoms. Since symptoms are the same between lower and higher BP's, this likely may not be related to her BP. However, her BP is elevated so I will forward to Dr. Burt Knack for recommendation.

## 2021-07-03 NOTE — Telephone Encounter (Signed)
I agree.  It does not sound like her symptoms are related to low blood pressure and the symptoms are not typically seen with moderate blood pressure elevation as her readings indicate.  I would just keep an eye on things from a blood pressure perspective and undergo evaluation by her primary care physician as you have suggested.  Thanks

## 2021-07-03 NOTE — Telephone Encounter (Signed)
Pt c/o BP issue: STAT if pt c/o blurred vision, one-sided weakness or slurred speech  1. What are your last 5 BP readings? 8/5 142/72 HR 77 8/6 157/81 HR 85 8/7 142/76 HR 62 154/81 HR 78 8/8 144/75   HR  85       158/82  HR 77 8/9  48/76 HR 81  2. Are you having any other symptoms (ex. Dizziness, headache, blurred vision, passed out)? no  3. What is your BP issue? high

## 2021-07-03 NOTE — Telephone Encounter (Signed)
Patient is dizzy and when she gets up she feels light headed. 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 Only wants to see pcp.

## 2021-07-03 NOTE — Telephone Encounter (Signed)
LVM for return call. 

## 2021-07-04 NOTE — Telephone Encounter (Signed)
I spoke with patient and gave her information from Dr Burt Knack

## 2021-07-04 NOTE — Telephone Encounter (Signed)
Spoke with pt who states that she is lightheaded and dizzy.  She states her BP was 150/74 this morning.  Per Dr. Zigmund Daniel, he does not have any time in his schedule to  work the pt in.  Advised pt that Dr. Zigmund Daniel does not have any openings today and scheduled her for 4pm tomorrow with Caleen Jobs.  Charyl Bigger, CMA

## 2021-07-05 ENCOUNTER — Encounter: Payer: Self-pay | Admitting: Family Medicine

## 2021-07-05 ENCOUNTER — Telehealth (INDEPENDENT_AMBULATORY_CARE_PROVIDER_SITE_OTHER): Payer: Medicare Other | Admitting: Family Medicine

## 2021-07-05 VITALS — BP 148/75 | HR 72 | Wt 119.0 lb

## 2021-07-05 DIAGNOSIS — I10 Essential (primary) hypertension: Secondary | ICD-10-CM

## 2021-07-05 DIAGNOSIS — R109 Unspecified abdominal pain: Secondary | ICD-10-CM

## 2021-07-05 DIAGNOSIS — R42 Dizziness and giddiness: Secondary | ICD-10-CM

## 2021-07-05 DIAGNOSIS — R140 Abdominal distension (gaseous): Secondary | ICD-10-CM

## 2021-07-05 NOTE — Progress Notes (Signed)
Pt reports that her bp's have been elevated since 8/5 and she gets lightheaded and feels like she may faint  Last night she had chills. She stated the house temp was 72 and she put on bathrobe and got under several blankets.   She had pain in her groin last night and abdominal pain and soft stools. She had about 4-6 episodes last night and since waking this morning she has gone 4x(she has some GI issues) she stated that she had COVID 2 wks ago.   No SOB, dry cough,fever.

## 2021-07-05 NOTE — Progress Notes (Signed)
Virtual Video Visit via MyChart Note  I connected with  [name] on 07/05/21 at  4:00 PM EDT by the video enabled telemedicine application for MyChart, and verified that I am speaking with the correct person using two identifiers.   I introduced myself as a Designer, jewellery with the practice. We discussed the limitations of evaluation and management by telemedicine and the availability of in person appointments. The patient expressed understanding and agreed to proceed.  Participating parties in this visit include: The patient and the nurse practitioner listed and patient's grandson. The patient is: At home I am: In the office - [facility]  Subjective:    CC:  Chief Complaint  Patient presents with   Hypertension    HPI: Kristine [name] is a 80 y.o. year old female presenting today via [place] today for high blood pressure readings, dizziness, and abdominal discomfort..  Patient states her cardiologist had stopped her metoprolol, but her mail-order pharmacy sent her new refill so she accidentally took about 5 days worth a week or so ago. States she started feeling lightheaded and called her cardiologist who told her she should not be taking the metoprolol. She then started monitoring her BP at home and was nervous because her BP was fluctuating so much.  8/5: lightheaded all day: 146/72 HR 77;  8/6 lightheaded 157/81 HR 85;  8/7: 142/76 HR 92, 154/81 HR 78;  8/8: 144/75 HR 85, 158/82 HR 77, 148/76, HR 81;  8/9: 139/81 HR 88, 167/84 HR 75;  8/10: 150/74 HR 89, 154/73 HR 73;  8/11: 148/75 HR 82  She reports the lightheadedness has mostly improved since stopping the metoprolol but she still gets a little dizzy with quick position changes or bending over. States rolling in bed and turning head side to side are not triggers, but getting out of the chair or bending to pick something up are. She has not felt pre-syncopal. Yolanda Bonine states she never drinks water and he thinks she  may be slightly dehydrated. She mostly drinks juice, coffee, tea, etc.   Additionally, last night during the middle of the night she woke up with lower mid-abdominal cramping and had 6 soft stools (not diarrhea). She had this happen a few more times during the day today. Denies any blood, abdominal pain apart from leading to a BM. States she has been drinking about 16 ounces of beet juice daily for the past 3-4 days because she thought it would bring her blood pressure down.   Grandson states patient tends to get fixated on things and easily stressed (ex: recent BP readings).  Patient had COVID 2 weeks ago. Doing better overall apart from occasional dry cough and mild dizziness, fatigue.      Past medical history, Surgical history, Family history not pertinant except as noted below, Social history, Allergies, and medications have been entered into the medical record, reviewed, and corrections made.   Review of Systems:  All review of systems negative except what is listed in the HPI   Objective:    General:  Speaking clearly in complete sentences. Absent shortness of breath noted.   Alert and oriented x3.   Normal judgment.  Absent acute distress.   Impression and Recommendations:    1. Abdominal bloating with cramps Likely related to the increased beet juice. No red flags today. Recommend she stay hydrated and watch for any worsening signs/symptoms.   2. Dizziness Considering orthostatic hypotension given trigger of position changes or possible vertigo/post-COVID dizziness. Recommend she  use flonase daily. Stay hydrated. Change positions slowly. She does have a history of anemia, so if symptoms continue or worsen, we will need to do labs.   3. Hypertension, unspecified type BP has not been consistently, dangerously high. Reassured patient. I do believe she started getting worked up and taking her BP so many times per day was not helping. Encouraged her to stay hydrated and try to  control stressors. Discussed appropriate method of checking BP: resting calmly for at least 10-15 minutes, keeping legs uncrossed, rest arm close to heart level. No need to check more than once per day unless having worsening symptoms.   Encouraged her to try the above over the weekend to see if symptoms improve. Patient aware of signs/symptoms requiring further/urgent evaluation. Cannot complete full assessment due to limitations of virtual visits. If symptoms persist after the weekend, would like her to be seen in person for thorough evaluation and lab work. Patient and grandson agreeable to plan.   Follow-up in 4 days if symptoms worsen or fail to improve.   [employer] N[employer] Olevia Bowens, DNP, FNP-C      Follow-up if symptoms worsen or fail to improve.    I discussed the assessment and treatment plan with the patient. The patient was provided an opportunity to ask questions and all were answered. The patient agreed with the plan and demonstrated an understanding of the instructions.   The patient was advised to call back or seek an in-person evaluation if the symptoms worsen or if the condition fails to improve as anticipated.  I spent 20 minutes dedicated to the care of this patient on the date of this encounter to include pre-visit chart review of prior notes and results, face-to-face time with the patient, and post-visit ordering of testing as indicated.    Pur[employer] Nails Olevia Bowens, DNP, FNP-C

## 2021-07-14 ENCOUNTER — Encounter: Payer: Self-pay | Admitting: Emergency Medicine

## 2021-07-14 ENCOUNTER — Other Ambulatory Visit: Payer: Self-pay

## 2021-07-14 ENCOUNTER — Emergency Department
Admission: EM | Admit: 2021-07-14 | Discharge: 2021-07-14 | Disposition: A | Discharge status: Home / Self Care | Payer: Medicare Other | Source: Home / Self Care | Attending: Family Medicine | Admitting: Family Medicine

## 2021-07-14 DIAGNOSIS — R300 Dysuria: Secondary | ICD-10-CM | POA: Diagnosis not present

## 2021-07-14 LAB — POCT URINALYSIS DIP (MANUAL ENTRY)
Bilirubin, UA: NEGATIVE
Blood, UA: NEGATIVE
Glucose, UA: NEGATIVE mg/dL
Ketones, POC UA: NEGATIVE mg/dL
Nitrite, UA: NEGATIVE
Protein Ur, POC: NEGATIVE mg/dL
Spec Grav, UA: 1.015 (ref 1.010–1.025)
Urobilinogen, UA: 0.2 E.U./dL
pH, UA: 6 (ref 5.0–8.0)

## 2021-07-14 MED ORDER — CEPHALEXIN 250 MG PO CAPS: 250.0000 mg | ORAL_CAPSULE | Freq: Three times a day (TID) | ORAL | 0 refills | Status: DC

## 2021-07-14 NOTE — ED Provider Notes (Signed)
[name]    CSN: [id number] Arrival date & time: 07/14/21  1341      History   Chief Complaint Chief Complaint  Patient presents with   Urinary Frequency    HPI Letti Ballog is a 80 y.o. female.   HPI Patient is here for dysuria and frequency.  She believes she has a bladder infection.  She has not had these frequently, but does recognize the symptoms.  No abdominal pain.  No nausea or vomiting.  No fever or chills.  No flank pain.  While we were discussing her bladder infection she started talking about dizziness.She states that she has had this for weeks.  She has had a video visit with her primary care.  They told her she might be dehydrated.  She has been drinking more water.  She is still dizzy.  She tells me that she is on blood pressure medication.  I do not see 1 among the medicines listed.  I have recommended to her that she follow-up with her primary care doctor for the symptoms as it is beyond the scope of my ability to care for today. Past Medical History:  Diagnosis Date   Anemia    Arthritis    Barrett's esophagus    Stage B   CAD (coronary artery disease)    a. s/p STEMI 7/14 >> LHC: LM 20, mLAD 30-40, small Dx 100 (L-L collats), OM1 90, OM2 30-40, mRCA 50-60, Lat/AL akinesis, EF 35-40% >> PCI: Promus DES to OM1;  b. Myoview 3/16: diaph atten, no ischemia, EF 59% (intermediate risk b/c BP drop 152>>137, but recovery BP 162 (tech error) >> med Rx continued  c. 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 Carotid US 2/12:  bilateral ICA < 50%   Hyperlipidemia    Hypertension    Ischemic cardiomyopathy    a. EF 35-40% by LHC at time of MI;  b. Echo 7/14:  Mild focal basal septal hypertrophy, EF 50-55%, apical HK, grade 1 diastolic dysfunction, trivial AI    Ischemic colitis ([place])    Orthostatic hypotension    OSA on CPAP    Osteoporosis 12/2017   T score -2.6   Raynaud disease    Rectocele    mild    Patient Active Problem List   Diagnosis Date Noted   Pseudophakia of left eye 05/31/2021   Pseudophakia of right eye 05/31/2021   History of osteoporosis 05/27/2021   Mononucleosis 05/27/2021   Cystoid macular edema of left eye 10/11/2020   MI (myocardial infarction) ([place]) 10/09/2020   COVID-19 virus infection 05/04/2020   Type 2 macular telangiectasis, right 03/20/2020   Cystoid macular edema of right eye 03/20/2020   Type 2 macular telangiectasis, left 03/20/2020   Obstructive sleep apnea, adult 03/20/2020   Cr(e)st syndrome ([place]) 05/25/2018   Ulcer of esophagus with bleeding 02/27/2018   Psychophysiological insomnia 09/24/2017   Abnormal nuclear cardiac imaging test 05/30/2017   Abnormal stress echo    Solar lentigo 11/08/2016  Acute blood loss anemia 10/10/2015   Red blood cell antibody positive 10/04/2015   CAD (coronary artery disease), native coronary artery 06/27/2013   Hyperlipidemia 06/27/2013   Chest pain of uncertain etiology [id number]   History of ST elevation myocardial infarction (STEMI) 06/24/2013   Hypertension    Great toe pain 09/11/2012   H/O foot surgery 04/09/2012   Hallux varus (acquired) 02/25/2012   GERD (gastroesophageal reflux disease)    Barrett's esophagus    Cystocele    Rectocele     Past Surgical History:  Procedure Laterality Date   [place] for menorrhagia.   CORONARY ANGIOPLASTY WITH STENT PLACEMENT  06/21/2013   CORONARY STENT INTERVENTION N/A 05/30/2017   Procedure: Coronary Stent Intervention;  Surgeon: [name],  MD;  Location: [facility];  Service: Cardiovascular;  Laterality: N/A;   FOOT SURGERY  2013   x2   INTRAVASCULAR PRESSURE WIRE/FFR STUDY N/A 05/30/2017   Procedure: Intravascular Pressure Wire/FFR Study;  Surgeon: [name], MD;  Location: [facility];  Service: Cardiovascular;  Laterality: N/A;   JOINT REPLACEMENT  9/10   rt total   LEFT HEART CATH AND CORONARY ANGIOGRAPHY N/A 05/30/2017   Procedure: Left Heart Cath and Coronary Angiography;  Surgeon: [name], MD;  Location: [facility];  Service: Cardiovascular;  Laterality: N/A;   nissen fundoplication  123456   ROTATOR CUFF REPAIR     TONSILLECTOMY  1950   TOTAL KNEE ARTHROPLASTY     right   TUBAL LIGATION  1980    OB History     Gravida  1   Para  1   Term      Preterm      AB      Living  1      SAB      IAB      Ectopic      Multiple      Live Births               Home Medications    Prior to Admission medications   Medication Sig Start Date End Date Taking? Authorizing Provider  acetaminophen (TYLENOL) 500 MG tablet Take 1,000 mg by mouth every 6 (six) hours as needed for headache.   Yes [provider]  albuterol (VENTOLIN HFA) 108 (90 Base) MCG/ACT inhaler Inhale 1-2 puffs into the lungs every 6 (six) hours as needed for wheezing or shortness of breath. 04/18/20  Yes Phelps, Erin O, PA-C  ALPRAZolam (XANAX) 0.25 MG tablet Take 1 tablet (0.25 mg total) by mouth daily as needed for anxiety. 09/17/17  Yes Wardell Honour, MD  aspirin EC 81 MG EC tablet Take 1 tablet (81 mg total) by mouth daily. 06/24/13  Yes Barrett, Evelene Croon, PA-C  cephALEXin (KEFLEX) 250 MG capsule Take 1 capsule (250 mg total) by mouth 3 (three) times daily. 07/14/21  Yes Raylene Everts, MD  ezetimibe (ZETIA) 10 MG tablet TAKE 1/2 TABLET EVERY DAY 04/25/21  Yes [name], MD  famotidine (PEPCID) 40 MG tablet Take 1 tablet by mouth at bedtime. 12/21/18  Yes [provider]   nitroGLYCERIN (NITROSTAT) 0.4 MG SL tablet Place 1 tablet (0.4 mg total) under the tongue every 5 (five) minutes x 3 doses as needed for chest pain. 09/08/20  Yes [name], MD  pantoprazole (PROTONIX) 40 MG tablet Take 40 mg by mouth daily.   Yes [provider]  pravastatin (PRAVACHOL) 20  MG tablet Take 1 tablet (20 mg total) by mouth every evening. 04/25/21  Yes [name], MD  valACYclovir (VALTREX) 1000 MG tablet Take 1,000 mg by mouth 2 (two) times daily. 02/03/21  Yes [provider]    Family History Family History  Problem Relation Age of Onset   Cancer Mother        Pancreatic Cancer   Hepatitis C Mother    Heart disease Father        congestive heart failure   Heart attack Father    Panic disorder Daughter    Hypertension Daughter    Asthma Maternal Grandmother    Emphysema Maternal Grandmother    Cancer Maternal Grandfather        lung   Stroke Neg Hx     Social History Social History   Tobacco Use   Smoking status: Never   Smokeless tobacco: Never  Vaping Use   Vaping Use: Never used  Substance Use Topics   Alcohol use: No    Alcohol/week: 0.0 standard drinks   Drug use: No     Allergies   Nsaids and Aspirin   Review of Systems Review of Systems See HPI  Physical Exam Triage Vital Signs ED Triage Vitals  Enc Vitals Group     BP 07/14/21 1405 118/79     Pulse Rate 07/14/21 1405 79     Resp 07/14/21 1405 18     Temp 07/14/21 1405 98.9 F (37.2 C)     Temp Source 07/14/21 1405 Oral     SpO2 07/14/21 1405 98 %     Weight 07/14/21 1407 126 lb (57.2 kg)     Height 07/14/21 1407 '5\' 1"'$  (1.549 m)     Head Circumference --      Peak Flow --      Pain Score 07/14/21 1407 0     Pain Loc --      Pain Edu? --      Excl. in [place]? --    No data found.  Updated Vital Signs BP 118/79 (BP Location: Right Arm)   Pulse 79   Temp 98.9 F (37.2 C) (Oral)   Resp 18   Ht '5\' 1"'$  (1.549 m)   Wt 57.2 kg   LMP 11/25/1994   SpO2  98%   BMI 23.81 kg/m      Physical Exam Constitutional:      General: She is not in acute distress.    Appearance: Normal appearance. She is well-developed.  HENT:     Head: Normocephalic and atraumatic.     Mouth/Throat:     Comments: Mask is in place Eyes:     Conjunctiva/sclera: Conjunctivae normal.     Pupils: Pupils are equal, round, and reactive to light.  Cardiovascular:     Rate and Rhythm: Normal rate.  Pulmonary:     Effort: Pulmonary effort is normal. No respiratory distress.  Abdominal:     General: There is no distension.     Palpations: Abdomen is soft.     Tenderness: There is no right CVA tenderness or left CVA tenderness.  Musculoskeletal:        General: Normal range of motion.     Cervical back: Normal range of motion.  Skin:    General: Skin is warm and dry.  Neurological:     General: No focal deficit present.     Mental Status: She is alert.     Gait: Gait normal.   Talkative.  Vital signs are stable.  UC Treatments / Results  Labs (all labs ordered are listed, but only abnormal results are displayed) Labs Reviewed  POCT URINALYSIS DIP (MANUAL ENTRY) - Abnormal; Notable for the following components:      Result Value   Leukocytes, UA Moderate (2+) (*)    All other components within normal limits  URINE CULTURE    EKG   Radiology No results found.  Procedures Procedures (including critical care time)  Medications Ordered in UC Medications - No data to display  Initial Impression / Assessment and Plan / UC Course  I have reviewed the triage vital signs and the nursing notes.  Pertinent labs & imaging results that were available during my care of the patient were reviewed by me and considered in my medical decision making (see chart for details).     I explained to the patient that we can treat her for her cystitis.  Urine culture is sent.  I do recommend she follow-up with Dr. Zigmund Daniel for her dizzy symptoms  Final Clinical  Impressions(s) / UC Diagnoses   Final diagnoses:  Dysuria     Discharge Instructions      Continue to drink lots of water Take antibiotic 3 times a day for a week The urine has been sent to the laboratory for culture.  If a change in antibiotic is necessary you will be notified To Dr. Zigmund Daniel at his next available appointment   ED Prescriptions     Medication Sig Dispense Auth. Provider   cephALEXin (KEFLEX) 250 MG capsule Take 1 capsule (250 mg total) by mouth 3 (three) times daily. 21 capsule Raylene Everts, MD      PDMP not reviewed this encounter.   Raylene Everts, MD 07/14/21 [phone]

## 2021-07-14 NOTE — Discharge Instructions (Addendum)
Continue to drink lots of water Take antibiotic 3 times a day for a week The urine has been sent to the laboratory for culture.  If a change in antibiotic is necessary you will be notified To Dr. Zigmund Daniel at his next available appointment

## 2021-07-14 NOTE — ED Triage Notes (Signed)
Patient c/o urinary frequency, some dysuria, urine is clear, no hematuria.  Patient states there are some "white speckles" in her urine.  No pain.  Denies any OTC meds.

## 2021-07-16 ENCOUNTER — Other Ambulatory Visit: Payer: Self-pay | Admitting: Internal Medicine

## 2021-07-16 ENCOUNTER — Other Ambulatory Visit: Payer: Self-pay | Admitting: Family Medicine

## 2021-07-16 DIAGNOSIS — Z1231 Encounter for screening mammogram for malignant neoplasm of breast: Secondary | ICD-10-CM

## 2021-07-16 LAB — URINE CULTURE
MICRO NUMBER:: 12271281
Result:: NO GROWTH
SPECIMEN QUALITY:: ADEQUATE

## 2021-07-19 ENCOUNTER — Other Ambulatory Visit: Payer: Self-pay

## 2021-07-19 ENCOUNTER — Ambulatory Visit (INDEPENDENT_AMBULATORY_CARE_PROVIDER_SITE_OTHER): Payer: Medicare Other

## 2021-07-19 DIAGNOSIS — Z1231 Encounter for screening mammogram for malignant neoplasm of breast: Secondary | ICD-10-CM

## 2021-07-24 ENCOUNTER — Ambulatory Visit (INDEPENDENT_AMBULATORY_CARE_PROVIDER_SITE_OTHER): Payer: Medicare Other | Admitting: Family Medicine

## 2021-07-24 ENCOUNTER — Other Ambulatory Visit: Payer: Self-pay

## 2021-07-24 ENCOUNTER — Encounter: Payer: Self-pay | Admitting: Family Medicine

## 2021-07-24 VITALS — BP 142/61 | HR 99 | Temp 97.7°F | Ht 61.0 in | Wt 125.6 lb

## 2021-07-24 DIAGNOSIS — R42 Dizziness and giddiness: Secondary | ICD-10-CM | POA: Diagnosis not present

## 2021-07-24 MED ORDER — MECLIZINE HCL 25 MG PO TABS: 12.5000 mg | ORAL_TABLET | Freq: Three times a day (TID) | ORAL | 0 refills | Status: DC | PRN

## 2021-07-24 NOTE — Patient Instructions (Signed)
Vertigo Vertigo is the feeling that you or the things around you are moving when they are not. This feeling can come and go at any time. Vertigo often goes away on its own. This condition can be dangerous if it happens when you are doingactivities like driving or working with machines. Your doctor will do tests to find the cause of your vertigo. These tests willalso help your doctor decide on the best treatment for you. Follow these instructions at home: Eating and drinking     Drink enough fluid to keep your pee (urine) pale yellow. Do not drink alcohol. Activity Return to your normal activities when your doctor says that it is safe. In the morning, first sit up on the side of the bed. When you feel okay, stand slowly while you hold onto something until you know that your balance is fine. Move slowly. Avoid sudden body or head movements or certain positions, as told by your doctor. Use a cane if you have trouble standing or walking. Sit down right away if you feel dizzy. Avoid doing any tasks or activities that can cause danger to you or others if you get dizzy. Avoid bending down if you feel dizzy. Place items in your home so that they are easy for you to reach without bending or leaning over. Do not drive or use machinery if you feel dizzy. General instructions Take over-the-counter and prescription medicines only as told by your doctor. Keep all follow-up visits. Contact a doctor if: Your medicine does not help your vertigo. Your problems get worse or you have new symptoms. You have a fever. You feel like you may vomit (nauseous), or this feeling gets worse. You start to vomit. Your family or friends see changes in how you act. You lose feeling (have numbness) in part of your body. You feel prickling and tingling in a part of your body. Get help right away if: You are always dizzy. You faint. You get very bad headaches. You get a stiff neck. Bright light starts to bother  you. You have trouble moving or talking. You feel weak in your hands, arms, or legs. You have changes in your hearing or in how you see (vision). These symptoms may be an emergency. Get help right away. Call your local emergency services (911 in the [place] Do not wait to see if the symptoms will go away. Do not drive yourself to the hospital. Summary Vertigo is the feeling that you or the things around you are moving when they are not. Your doctor will do tests to find the cause of your vertigo. You may be told to avoid some tasks, positions, or movements. Contact a doctor if your medicine is not helping, or if you have a fever, new symptoms, or a change in how you act. Get help right away if you get very bad headaches, or if you have changes in how you speak, hear, or see. This information is not intended to replace advice given to you by your health care provider. Make sure you discuss any questions you have with your healthcare provider. Document Revised: 10/11/2020 Document Reviewed: 10/11/2020 Elsevier Patient Education  2022 [employer]  

## 2021-07-24 NOTE — Progress Notes (Signed)
Kristine [name] - 80 y.o. female MRN [id number]  Date of birth: 1941/07/04  Subjective Chief Complaint  Patient presents with   Dizziness    HPI Kristine [name] is a 80 year old female here today with complaint of dizziness/lightheadedness.  She has been dealing with this for a few weeks.  Symptoms occur when she stands up or turns her head too quickly.  She does not recall any illness preceding this.  She denies tinnitus or ear fullness.  She did mention this to [facility] at her visit earlier this month however she had just stopped her metoprolol at that time as it was thought that this may be contributing.   She continues to have symptoms despite stopping her metoprolol.  She denies any nausea, headaches or other neurological symptoms at this point.  ROS:  A comprehensive ROS was completed and negative except as noted per HPI Taking lap Allergies  Allergen Reactions   Nsaids Other (See Comments)    Pt is prone to bleeding   Aspirin Other (See Comments)    Other reaction(s): Other Can take 81 mg but not aspirin based medications Pt is prone to bleeding, ok with low dose coated aspirin    Past Medical History:  Diagnosis Date   Anemia    Arthritis    Barrett's esophagus    Stage B   CAD (coronary artery disease)    a. s/p STEMI 7/14 >> LHC: LM 20, mLAD 30-40, small Dx 100 (L-L collats), OM1 90, OM2 30-40, mRCA 50-60, Lat/AL akinesis, EF 35-40% >> PCI: Promus DES to OM1;  b. Myoview 3/16: diaph atten, no ischemia, EF 59% (intermediate risk b/c BP drop 152>>137, but recovery BP 162 (tech error) >> med Rx continued  c. cath 05/30/17 s/p FFR guided PCI with DES to circumflex/first OM    COVID-19 virus infection    CREST variant of scleroderma ([place])    Cystocele    Diverticulitis    Event monitor 04/2019   Event monitor 04/2019: Sinus rhythm; no atrial fibrillation/flutter; no bradycardic events; few PVCs and occasional ventricular runs up to 9 beats   GERD (gastroesophageal reflux disease)    History  of blood transfusion    hx of being Anti-K positive   History of Doppler ultrasound    a. Carotid US 2/12: bilateral ICA < 50%   Hyperlipidemia    Hypertension    Ischemic cardiomyopathy    a. EF 35-40% by LHC at time of MI;  b. Echo 7/14:  Mild focal basal septal hypertrophy, EF 50-55%, apical HK, grade 1 diastolic dysfunction, trivial AI    Ischemic colitis ([place])    Orthostatic hypotension    OSA on CPAP    Osteoporosis 12/2017   T score -2.6   Raynaud disease    Rectocele    mild    Past Surgical History:  Procedure Laterality Date   ABDOMINAL HYSTERECTOMY  1996   RSO for menorrhagia.   CORONARY ANGIOPLASTY WITH STENT PLACEMENT  06/21/2013   CORONARY STENT INTERVENTION N/A 05/30/2017   Procedure: Coronary Stent Intervention;  Surgeon: [name], MD;  Location: [place];  Service: Cardiovascular;  Laterality: N/A;   FOOT SURGERY  2013   x2   INTRAVASCULAR PRESSURE WIRE/FFR STUDY N/A 05/30/2017   Procedure: Intravascular Pressure Wire/FFR Study;  Surgeon: [name], MD;  Location: [place] [facility];  Service: Cardiovascular;  Laterality: N/A;   JOINT REPLACEMENT  9/10   rt total   LEFT HEART CATH AND CORONARY ANGIOGRAPHY N/A  05/30/2017   Procedure: Left Heart Cath and Coronary Angiography;  Surgeon: [name], MD;  Location: [place];  Service: Cardiovascular;  Laterality: N/A;   nissen fundoplication  123456   ROTATOR CUFF REPAIR     TONSILLECTOMY  1950   TOTAL KNEE ARTHROPLASTY     right   TUBAL LIGATION  1980    Social History   Socioeconomic History   Marital status: Divorced    Spouse name: Not on file   Number of children: Not on file   Years of education: Not on file   Highest education level: Not on file  Occupational History   Not on file  Tobacco Use   Smoking status: Never   Smokeless tobacco: Never  Vaping Use   Vaping Use: Never used  Substance and Sexual Activity   Alcohol use: No    Alcohol/week: 0.0 standard  drinks   Drug use: No   Sexual activity: Never    Comment: 1st intercourse 68 yo-1 partner  Other Topics Concern   Not on file  Social History Narrative   Exercise dancing 3 x times weekly for 1-2 hours   Social Determinants of Health   Financial Resource Strain: Not on file  Food Insecurity: Not on file  Transportation Needs: Not on file  Physical Activity: Not on file  Stress: Not on file  Social Connections: Not on file    Family History  Problem Relation Age of Onset   Cancer Mother        Pancreatic Cancer   Hepatitis C Mother    Heart disease Father        congestive heart failure   Heart attack Father    Panic disorder Daughter    Hypertension Daughter    Asthma Maternal Grandmother    Emphysema Maternal Grandmother    Cancer Maternal Grandfather        lung   Stroke Neg Hx     Health Maintenance  Topic Date Due   COVID-19 Vaccine (1) Never done   Hepatitis C Screening  Never done   TETANUS/TDAP  Never done   Zoster Vaccines- Shingrix (1 of 2) Never done   PNA vac Low Risk Adult (2 of 2 - PPSV23) 11/22/2016   INFLUENZA VACCINE  06/25/2021   MAMMOGRAM  07/19/2022   DEXA SCAN  Completed   HPV VACCINES  Aged Out     ----------------------------------------------------------------------------------------------------------------------------------------------------------------------------------------------------------------- Physical Exam BP (!) 142/61 (BP Location: Left Arm, Patient Position: Sitting, Cuff Size: Small)   Pulse 99   Temp 97.7 F (36.5 C)   Ht '5\' 1"'$  (1.549 m)   Wt 125 lb 9.6 oz (57 kg)   LMP 11/25/1994   SpO2 100%   BMI 23.73 kg/m   Physical Exam Constitutional:      Appearance: Normal appearance.  HENT:     Head: Normocephalic and atraumatic.     Right Ear: Tympanic membrane normal.     Left Ear: Tympanic membrane normal.  Eyes:     General: No scleral icterus. Cardiovascular:     Rate and Rhythm: Normal rate and regular  rhythm.  Pulmonary:     Effort: Pulmonary effort is normal.     Breath sounds: Normal breath sounds.  Musculoskeletal:     Cervical back: Neck supple.  Skin:    General: Skin is warm and dry.  Neurological:     Mental Status: She is alert.     Comments: Strongly positive Dix-Hallpike to the left with  mildly positive Dix-Hallpike on the right.  No other neurologic deficits or symptoms.  Psychiatric:        Mood and Affect: Mood normal.        Behavior: Behavior normal.    ------------------------------------------------------------------------------------------------------------------------------------------------------------------------------------------------------------------- Assessment and Plan  Vertigo Symptoms consistent with positional vertigo.  No other neurological symptoms to indicate neuroimaging at this time.  We will have her try vestibular rehab.  Adding meclizine as needed for symptoms.  Discussed that this may make her drowsy.   Meds ordered this encounter  Medications   meclizine (ANTIVERT) 25 MG tablet    Sig: Take 0.5-1 tablets (12.5-25 mg total) by mouth 3 (three) times daily as needed for dizziness.    Dispense:  30 tablet    Refill:  0    No follow-ups on file.    This visit occurred during the SARS-CoV-2 public health emergency.  Safety protocols were in place, including screening questions prior to the visit, additional usage of staff PPE, and extensive cleaning of exam room while observing appropriate contact time as indicated for disinfecting solutions.

## 2021-07-24 NOTE — Assessment & Plan Note (Signed)
Symptoms consistent with positional vertigo.  No other neurological symptoms to indicate neuroimaging at this time.  We will have her try vestibular rehab.  Adding meclizine as needed for symptoms.  Discussed that this may make her drowsy.

## 2021-07-30 ENCOUNTER — Telehealth: Payer: Self-pay | Admitting: Emergency Medicine

## 2021-07-30 NOTE — Telephone Encounter (Signed)
Call from [place] regarding ongoing urinary symptoms. States she still sees small amounts of  "strands" in her urine. Unable to be seen in person for vertigo per pt - known problem, will be seeing PT this week. RN recommended pt do a virtual visit, pt stated she did not like doing virtual visits and would follow up w/ her PCP in the am. RN explained that we are unable to prescribe medicine with out being seen, pt verbalized an understanding. Chart reviewed w/ provider here today.

## 2021-08-02 ENCOUNTER — Other Ambulatory Visit: Payer: Self-pay

## 2021-08-02 ENCOUNTER — Ambulatory Visit: Payer: Medicare Other | Admitting: Rehabilitative and Restorative Service Providers"

## 2021-08-02 DIAGNOSIS — R42 Dizziness and giddiness: Secondary | ICD-10-CM | POA: Diagnosis not present

## 2021-08-02 DIAGNOSIS — H8112 Benign paroxysmal vertigo, left ear: Secondary | ICD-10-CM

## 2021-08-02 NOTE — Therapy (Signed)
[facility] [place], [place], [address] Phone: [phone]   Fax:  [phone]  Physical Therapy Evaluation  Patient Details  Name: Kristine [name] MRN: [id number] Date of Birth: 10-08-1941 Referring Provider (PT): Luetta Nutting, DO   Encounter Date: 08/02/2021   PT End of Session - 08/02/21 1329     Visit Number 1    Number of Visits 8    Date for PT Re-Evaluation 09/01/21    Authorization Type [employer] medicare    PT Start Time 0935    PT Stop Time 1022    PT Time Calculation (min) 47 min    Activity Tolerance Patient tolerated treatment well    Behavior During Therapy [facility] for tasks assessed/performed             Past Medical History:  Diagnosis Date   Anemia    Arthritis    Barrett's esophagus    Stage B   CAD (coronary artery disease)    a. s/p STEMI 7/14 >> LHC: LM 20, mLAD 30-40, small Dx 100 (L-L collats), OM1 90, OM2 30-40, mRCA 50-60, Lat/AL akinesis, EF 35-40% >> PCI: Promus DES to OM1;  b. Myoview 3/16: diaph atten, no ischemia, EF 59% (intermediate risk b/c BP drop 152>>137, but recovery BP 162 (tech error) >> med Rx continued  c. cath 05/30/17 s/p FFR guided PCI with DES to circumflex/first OM    COVID-19 virus infection    CREST variant of scleroderma ([place])    Cystocele    Diverticulitis    Event monitor 04/2019   Event monitor 04/2019: Sinus rhythm; no atrial fibrillation/flutter; no bradycardic events; few PVCs and occasional ventricular runs up to 9 beats   GERD (gastroesophageal reflux disease)    History of blood transfusion    hx of being Anti-K positive   History of Doppler ultrasound    a. Carotid US 2/12: bilateral ICA < 50%   Hyperlipidemia    Hypertension    Ischemic cardiomyopathy    a. EF 35-40% by LHC at time of MI;  b. Echo 7/14:  Mild focal basal septal hypertrophy, EF 50-55%, apical HK, grade 1 diastolic dysfunction, trivial AI    Ischemic colitis ([place])    Orthostatic  hypotension    OSA on CPAP    Osteoporosis 12/2017   T score -2.6   Raynaud disease    Rectocele    mild    Past Surgical History:  Procedure Laterality Date   ABDOMINAL HYSTERECTOMY  1996   RSO for menorrhagia.   CORONARY ANGIOPLASTY WITH STENT PLACEMENT  06/21/2013   CORONARY STENT INTERVENTION N/A 05/30/2017   Procedure: Coronary Stent Intervention;  Surgeon: [name], MD;  Location: [place] [facility];  Service: Cardiovascular;  Laterality: N/A;   FOOT SURGERY  2013   x2   INTRAVASCULAR PRESSURE WIRE/FFR STUDY N/A 05/30/2017   Procedure: Intravascular Pressure Wire/FFR Study;  Surgeon: [name], MD;  Location: [place] [facility];  Service: Cardiovascular;  Laterality: N/A;   JOINT REPLACEMENT  9/10   rt total   LEFT HEART CATH AND CORONARY ANGIOGRAPHY N/A 05/30/2017   Procedure: Left Heart Cath and Coronary Angiography;  Surgeon: [name], MD;  Location: [place] [facility];  Service: Cardiovascular;  Laterality: N/A;   nissen fundoplication  123456   ROTATOR CUFF REPAIR     TONSILLECTOMY  1950   TOTAL KNEE ARTHROPLASTY     right   TUBAL LIGATION  1980  There were no vitals filed for this visit.    Subjective Assessment - 08/02/21 0937     Subjective The patient reports her dizziness started on August 5 after getting out of bed.  She reports intermittent symptoms worse with bending her head, getting in/out of bed (she is elevating her head).  She describes a spinning sensation that remains for 10 minutes with lightheadedness that stays with her during the day.  She is taking meclizine 3x/day that helps somewhat.  "I can't do much.  I basically sit and watch TV."  She stopped going to the [facility] due to Covid and is not participating in ball room dancing due to dizziness.    Pertinent History MI, cardiac stent (2), GERD, R TKR, R toe surgery, h/o R RTC surgery    Patient Stated Goals Make dizziness go away.    Currently in Pain? No/denies                 [facility] PT Assessment - 08/02/21 0951       Assessment   Medical Diagnosis Vertigo    Referring Provider (PT) Luetta Nutting, DO    Onset Date/Surgical Date 06/29/21    Hand Dominance Right      Precautions   Precautions Fall      Restrictions   Weight Bearing Restrictions No      Balance Screen   Has the patient fallen in the past 6 months No    Has the patient had a decrease in activity level because of a fear of falling?  No    Is the patient reluctant to leave their home because of a fear of falling?  No      Home Environment   Living Environment Private residence    Home Access Level entry   one small step   [place] One level      Prior Function   Level of Independence Independent      Observation/Other Assessments   Focus on Therapeutic Outcomes (FOTO)  44%                    Vestibular Assessment - 08/02/21 0952       Vestibular Assessment   General Observation Ambulates into the clinic without assistive device      Symptom Behavior   Subjective history of current problem Sudden onset of dizziness 06/29/21    Type of Dizziness  Spinning;Lightheadedness    History of similar episodes None      Oculomotor Exam   Oculomotor Alignment Normal    Ocular ROM full    Spontaneous Absent    Gaze-induced  Absent    Smooth Pursuits Intact    Saccades Intact    Comment Cervical ROM:  Arthritis in back of neck and gets stiffness.      Vestibulo-Ocular Reflex   VOR 1 Head Only (x 1 viewing) at slow pace notes R sided neck discomfort; with increased speed she notes dizziness      Positional Testing   Dix-Hallpike Dix-Hallpike Right;Dix-Hallpike Left    Sidelying Test Sidelying Left;Sidelying Right    Horizontal Canal Testing Horizontal Canal Right;Horizontal Canal Left      Sidelying Right   Sidelying Right Duration nystagmus are apogeotropic in R sidelying (wards the L ear)    Sidelying Right Symptoms Left nystagmus;Upbeat Nystagmus      Sidelying Left    Sidelying Left Duration 10 seconds of nystagmus with lightheadedness remaining after nystagmus settle x 2 minutes  Sidelying Left Symptoms Upbeat, left rotatory nystagmus                Objective measurements completed on examination: See above findings.        Vestibular Treatment/Exercise - 08/02/21 1008       Vestibular Treatment/Exercise   Vestibular Treatment Provided Canalith Repositioning    Canalith Repositioning Canal Roll Left;Appiani Left   Kim, 2011 maneuver with vibration     Canal Roll Left   Number of Reps  1    Response Details  Used Kim Maneuver + mastoid vibration at first and 4th positions x 20 seconds      Appiani Left   Number of Reps  1    Response Details still note apogeotropic nystagmus                    PT Education - 08/02/21 1328     Education Details nature of BPPV    Person(s) Educated Patient    Methods Explanation;Demonstration;Handout    Comprehension Verbalized understanding;Returned demonstration                 PT Long Term Goals - 08/02/21 1335       PT LONG TERM GOAL #1   Title The patient will have negative positional testing.    Time 6    Period Weeks    Target Date 09/13/21      PT LONG TERM GOAL #2   Title The patient will be indep with habituation exercises if indicated.    Time 6    Period Weeks    Target Date 09/13/21      PT LONG TERM GOAL #3   Title The patient will report no dizziness with bending forward.    Time 6    Period Weeks    Target Date 09/13/21      PT LONG TERM GOAL #4   Title The patient will report she has returned to driving.    Time 6    Period Weeks    Target Date 09/13/21                    Plan - 08/02/21 1011     Clinical Impression Statement The patient is a 80 year old female presenting to OP physical therapy with > 1 month h/o vertigo.   She presents with + L sidelying test for upbeating nystagmus to the L indicating L posterior  canalithiasis and then apogeotropic nystagmus during R sidelying test that lasts > 2 minutes indicating L horizontal cupulolithiasis.  PT initiated treatment today with nystagmus still present at end of session.  Plan to continue treatment with canolith repositioning and habituation as needed.    Personal Factors and Comorbidities Comorbidity 1;Comorbidity 2    Comorbidities h/o MI, h/o cardiac stents    Examination-Activity Limitations Bed Mobility    Examination-Participation Restrictions Community Activity;Driving;Cleaning;Meal Prep    Stability/Clinical Decision Making Stable/Uncomplicated    Clinical Decision Making Low    Rehab Potential Good    PT Frequency 2x / week    PT Duration 4 weeks    PT Treatment/Interventions ADLs/Self Care Home Management;Patient/family education;Canalith Repostioning;Vestibular;Neuromuscular re-education;Gait training    PT Next Visit Plan recheck positional BPPV and treat most severe canal (has multi-canal L posterior and horizontal at eval)    Consulted and Agree with Plan of Care Patient             Patient will benefit from skilled  therapeutic intervention in order to improve the following deficits and impairments:  Dizziness, Decreased balance, Decreased activity tolerance  Visit Diagnosis: BPPV (benign paroxysmal positional vertigo), left  Dizziness and giddiness     Problem List Patient Active Problem List   Diagnosis Date Noted   Vertigo 07/24/2021   Pseudophakia of left eye 05/31/2021   Pseudophakia of right eye 05/31/2021   History of osteoporosis 05/27/2021   Mononucleosis 05/27/2021   Cystoid macular edema of left eye 10/11/2020   MI (myocardial infarction) ([place]) 10/09/2020   COVID-19 virus infection 05/04/2020   Type 2 macular telangiectasis, right 03/20/2020   Cystoid macular edema of right eye 03/20/2020   Type 2 macular telangiectasis, left 03/20/2020   Obstructive sleep apnea, adult 03/20/2020   Cr(e)st syndrome ([place])  05/25/2018   Ulcer of esophagus with bleeding 02/27/2018   Psychophysiological insomnia 09/24/2017   Abnormal nuclear cardiac imaging test 05/30/2017   Abnormal stress echo    Solar lentigo 11/08/2016   Acute blood loss anemia 10/10/2015   Red blood cell antibody positive 10/04/2015   S/P coronary artery stent placement 07/12/2014   CAD (coronary artery disease), native coronary artery 06/27/2013   Hyperlipidemia 06/27/2013   Chest pain of uncertain etiology [id number]   History of ST elevation myocardial infarction (STEMI) 06/24/2013   Hypertension    Great toe pain 09/11/2012   H/O foot surgery 04/09/2012   Hallux varus (acquired) 02/25/2012   GERD (gastroesophageal reflux disease)    Barrett's esophagus    Cystocele    Rectocele     Henrry Feil, PT 08/02/2021, 2:17 PM  [facility] [place] [address] [place], [place], [address] Phone: [phone]   Fax:  [phone]  Name: Zanasia Deer MRN: [id number] Date of Birth: Feb 10, 1941

## 2021-08-06 ENCOUNTER — Ambulatory Visit: Payer: Medicare Other | Admitting: Rehabilitative and Restorative Service Providers"

## 2021-08-06 ENCOUNTER — Other Ambulatory Visit: Payer: Self-pay

## 2021-08-06 DIAGNOSIS — H8112 Benign paroxysmal vertigo, left ear: Secondary | ICD-10-CM | POA: Diagnosis not present

## 2021-08-06 DIAGNOSIS — R42 Dizziness and giddiness: Secondary | ICD-10-CM | POA: Diagnosis not present

## 2021-08-06 NOTE — Therapy (Signed)
[place], [place], [address] Phone: [phone]   Fax:  [phone]  Physical Therapy Treatment  Patient Details  Name: Kristine [name] MRN: [id number] Date of Birth: [birth year] Referring Provider (PT): Luetta Nutting, DO   Encounter Date: 08/06/2021   PT End of Session - 08/06/21 0957     Visit Number 2    Number of Visits 8    Date for PT Re-Evaluation 09/01/21    Authorization Type [employer] medicare    PT Start Time [phone]    PT Stop Time 1022    PT Time Calculation (min) 46 min    Activity Tolerance Patient tolerated treatment well    Behavior During Therapy [facility] for tasks assessed/performed             Past Medical History:  Diagnosis Date   Anemia    Arthritis    Barrett's esophagus    Stage B   CAD (coronary artery disease)    a. s/p STEMI 7/14 >> LHC: LM 20, mLAD 30-40, small Dx 100 (L-L collats), OM1 90, OM2 30-40, mRCA 50-60, Lat/AL akinesis, EF 35-40% >> PCI: Promus DES to OM1;  b. Myoview 3/16: diaph atten, no ischemia, EF 59% (intermediate risk b/c BP drop 152>>137, but recovery BP 162 (tech error) >> med Rx continued  c. cath 05/30/17 s/p FFR guided PCI with DES to circumflex/first OM    COVID-19 virus infection    CREST variant of scleroderma ([place])    Cystocele    Diverticulitis    Event monitor 04/2019   Event monitor 04/2019: Sinus rhythm; no atrial fibrillation/flutter; no bradycardic events; few PVCs and occasional ventricular runs up to 9 beats   GERD (gastroesophageal reflux disease)    History of blood transfusion    hx of being Anti-K positive   History of Doppler ultrasound    a. Carotid US 2/12: bilateral ICA < 50%   Hyperlipidemia    Hypertension    Ischemic cardiomyopathy    a. EF 35-40% by LHC at time of MI;  b. Echo 7/14:  Mild focal basal septal hypertrophy, EF 50-55%, apical HK, grade 1 diastolic dysfunction, trivial AI    Ischemic colitis ([place])    Orthostatic  hypotension    OSA on CPAP    Osteoporosis 12/2017   T score -2.6   Raynaud disease    Rectocele    mild    Past Surgical History:  Procedure Laterality Date   ABDOMINAL HYSTERECTOMY  1996   RSO for menorrhagia.   CORONARY ANGIOPLASTY WITH STENT PLACEMENT  06/21/2013   CORONARY STENT INTERVENTION N/A 05/30/2017   Procedure: Coronary Stent Intervention;  Surgeon: [name], MD;  Location: [place] [facility];  Service: Cardiovascular;  Laterality: N/A;   FOOT SURGERY  2013   x2   INTRAVASCULAR PRESSURE WIRE/FFR STUDY N/A 05/30/2017   Procedure: Intravascular Pressure Wire/FFR Study;  Surgeon: [name], MD;  Location: [place] [facility];  Service: Cardiovascular;  Laterality: N/A;   JOINT REPLACEMENT  9/10   rt total   LEFT HEART CATH AND CORONARY ANGIOGRAPHY N/A 05/30/2017   Procedure: Left Heart Cath and Coronary Angiography;  Surgeon: [name], MD;  Location: [place] [facility];  Service: Cardiovascular;  Laterality: N/A;   nissen fundoplication  123456   ROTATOR CUFF REPAIR     TONSILLECTOMY  1950   TOTAL KNEE ARTHROPLASTY     right   TUBAL LIGATION  1980  There were no vitals filed for this visit.   Subjective Assessment - 08/06/21 0932     Subjective The patient reports that she has felt much better after last weeks' treatment session.  Her neck was sore in R upper c-spine after assessment and she took acetaminophen to control pain.  The patient notes dizziness only when in bed, none during the day.    Pertinent History MI, cardiac stent (2), GERD, R TKR, R toe surgery, h/o R RTC surgery    Patient Stated Goals Make dizziness go away.    Currently in Pain? No/denies                [facility] PT Assessment - 08/06/21 0932       Assessment   Medical Diagnosis Vertigo    Referring Provider (PT) Luetta Nutting, DO    Onset Date/Surgical Date 06/29/21    Hand Dominance Right                 Vestibular Assessment - 08/06/21 0938        Positional Testing   Dix-Hallpike Dix-Hallpike Right;Dix-Hallpike Left    Horizontal Canal Testing Horizontal Canal Right;Horizontal Canal Left      Sidelying Left   Sidelying Left Symptoms Upbeat, left rotatory nystagmus      Horizontal Canal Right   Horizontal Canal Right Duration prolonged apogeotropic nystagmus consistent while in the position    Horizontal Canal Right Symptoms Ageotrophic                       Vestibular Treatment/Exercise - 08/06/21 0945       Vestibular Treatment/Exercise   Vestibular Treatment Provided Canalith Repositioning;Habituation    Canalith Repositioning Appiani Left;Epley Manuever Left       EPLEY MANUEVER LEFT   Number of Reps  1     RESPONSE DETAILS LEFT patient with nausea, after treating with epley's a L sidelying test was negative for upbeating nystagmus      Canal Roll Left   Number of Reps  2    Response Details  Used Kim Maneuver + mastoid vibration at first and 4th positions x 20 seconds      Appiani Left   Number of Reps  2    Response Details L sidelying x 30 seconds, then turn head to look towards the floor and held 2 minutes + return to sitting (this is a casani/gufoni maneuver for horiz cupulolithiasis)                         PT Long Term Goals - 08/02/21 1335       PT LONG TERM GOAL #1   Title The patient will have negative positional testing.    Time 6    Period Weeks    Target Date 09/13/21      PT LONG TERM GOAL #2   Title The patient will be indep with habituation exercises if indicated.    Time 6    Period Weeks    Target Date 09/13/21      PT LONG TERM GOAL #3   Title The patient will report no dizziness with bending forward.    Time 6    Period Weeks    Target Date 09/13/21      PT LONG TERM GOAL #4   Title The patient will report she has returned to driving.    Time 6    Period Weeks  Target Date 09/13/21                   Plan - 08/06/21 0957     Clinical  Impression Statement The patient is tolerating treatment for vertigo well.  She felt improvement after evaluation, but apogeotropic nystagmus still present with positional testing.  PT initially treated for L horizonatal cupulolithiasis and then proceeded to treat for L posterior canal BPPV.  PT to continue to progress to patient tolerance.    PT Treatment/Interventions ADLs/Self Care Home Management;Patient/family education;Canalith Repostioning;Vestibular;Neuromuscular re-education;Gait training    PT Next Visit Plan recheck positional BPPV and treat most severe canal (has multi-canal L posterior and horizontal at eval)    Consulted and Agree with Plan of Care Patient             Patient will benefit from skilled therapeutic intervention in order to improve the following deficits and impairments:     Visit Diagnosis: BPPV (benign paroxysmal positional vertigo), left  Dizziness and giddiness     Problem List Patient Active Problem List   Diagnosis Date Noted   Vertigo 07/24/2021   Pseudophakia of left eye 05/31/2021   Pseudophakia of right eye 05/31/2021   History of osteoporosis 05/27/2021   Mononucleosis 05/27/2021   Cystoid macular edema of left eye 10/11/2020   MI (myocardial infarction) ([place]) 10/09/2020   COVID-19 virus infection 05/04/2020   Type 2 macular telangiectasis, right 03/20/2020   Cystoid macular edema of right eye 03/20/2020   Type 2 macular telangiectasis, left 03/20/2020   Obstructive sleep apnea, adult 03/20/2020   Cr(e)st syndrome ([place]) 05/25/2018   Ulcer of esophagus with bleeding 02/27/2018   Psychophysiological insomnia 09/24/2017   Abnormal nuclear cardiac imaging test 05/30/2017   Abnormal stress echo    Solar lentigo 11/08/2016   Acute blood loss anemia 10/10/2015   Red blood cell antibody positive 10/04/2015   S/P coronary artery stent placement 07/12/2014   CAD (coronary artery disease), native coronary artery 06/27/2013   Hyperlipidemia  06/27/2013   Chest pain of uncertain etiology [id number]   History of ST elevation myocardial infarction (STEMI) 06/24/2013   Hypertension    Great toe pain 09/11/2012   H/O foot surgery 04/09/2012   Hallux varus (acquired) 02/25/2012   GERD (gastroesophageal reflux disease)    Barrett's esophagus    Cystocele    Rectocele     Chany Woolworth, PT 08/06/2021, 12:17 PM  [facility] [address] [place], [place], [address] Phone: [phone]   Fax:  [phone]  Name: Kristine [name] MRN: [id number] Date of Birth: 1941-08-11

## 2021-08-08 ENCOUNTER — Encounter: Payer: Medicare Other | Admitting: Physical Therapy

## 2021-08-10 ENCOUNTER — Ambulatory Visit: Payer: Medicare Other | Admitting: Rehabilitative and Restorative Service Providers"

## 2021-08-10 ENCOUNTER — Other Ambulatory Visit: Payer: Self-pay

## 2021-08-10 DIAGNOSIS — H8112 Benign paroxysmal vertigo, left ear: Secondary | ICD-10-CM

## 2021-08-10 DIAGNOSIS — R42 Dizziness and giddiness: Secondary | ICD-10-CM

## 2021-08-10 NOTE — Patient Instructions (Signed)
Access Code: [email or web address] URL: [email or web address] Date: 08/10/2021 Prepared by: Rudell Cobb  Program Notes Try to sleep on your left side until I see you back in the clinic.    Exercises Rolling right<>left sides for vestibular habituation - 1 x daily - 7 x weekly - 1 sets - 5 reps

## 2021-08-10 NOTE — Therapy (Signed)
[place], [place], [address] Phone: [phone]   Fax:  [phone]  Physical Therapy Treatment  Patient Details  Name: Kristine [name] MRN: [id number] Date of Birth: [birth year] Referring Provider (PT): Luetta Nutting, DO   Encounter Date: 08/10/2021   PT End of Session - 08/10/21 1457     Visit Number 3    Number of Visits 8    Date for PT Re-Evaluation 09/01/21    Authorization Type UHC medicare    PT Start Time 1400    PT Stop Time [id number]    PT Time Calculation (min) 47 min    Activity Tolerance Patient tolerated treatment well    Behavior During Therapy [facility] for tasks assessed/performed             Past Medical History:  Diagnosis Date   Anemia    Arthritis    Barrett's esophagus    Stage B   CAD (coronary artery disease)    a. s/p STEMI 7/14 >> LHC: LM 20, mLAD 30-40, small Dx 100 (L-L collats), OM1 90, OM2 30-40, mRCA 50-60, Lat/AL akinesis, EF 35-40% >> PCI: Promus DES to OM1;  b. Myoview 3/16: diaph atten, no ischemia, EF 59% (intermediate risk b/c BP drop 152>>137, but recovery BP 162 (tech error) >> med Rx continued  c. cath 05/30/17 s/p FFR guided PCI with DES to circumflex/first OM    COVID-19 virus infection    CREST variant of scleroderma ([place])    Cystocele    Diverticulitis    Event monitor 04/2019   Event monitor 04/2019: Sinus rhythm; no atrial fibrillation/flutter; no bradycardic events; few PVCs and occasional ventricular runs up to 9 beats   GERD (gastroesophageal reflux disease)    History of blood transfusion    hx of being Anti-K positive   History of Doppler ultrasound    a. Carotid US 2/12: bilateral ICA < 50%   Hyperlipidemia    Hypertension    Ischemic cardiomyopathy    a. EF 35-40% by LHC at time of MI;  b. Echo 7/14:  Mild focal basal septal hypertrophy, EF 50-55%, apical HK, grade 1 diastolic dysfunction, trivial AI    Ischemic colitis ([place])    Orthostatic  hypotension    OSA on CPAP    Osteoporosis 12/2017   T score -2.6   Raynaud disease    Rectocele    mild    Past Surgical History:  Procedure Laterality Date   ABDOMINAL HYSTERECTOMY  1996   RSO for menorrhagia.   CORONARY ANGIOPLASTY WITH STENT PLACEMENT  06/21/2013   CORONARY STENT INTERVENTION N/A 05/30/2017   Procedure: Coronary Stent Intervention;  Surgeon: [name], MD;  Location: [place] [facility];  Service: Cardiovascular;  Laterality: N/A;   FOOT SURGERY  2013   x2   INTRAVASCULAR PRESSURE WIRE/FFR STUDY N/A 05/30/2017   Procedure: Intravascular Pressure Wire/FFR Study;  Surgeon: [name], MD;  Location: [place] [facility];  Service: Cardiovascular;  Laterality: N/A;   JOINT REPLACEMENT  9/10   rt total   LEFT HEART CATH AND CORONARY ANGIOGRAPHY N/A 05/30/2017   Procedure: Left Heart Cath and Coronary Angiography;  Surgeon: [name], MD;  Location: [place] [facility];  Service: Cardiovascular;  Laterality: N/A;   nissen fundoplication  123456   ROTATOR CUFF REPAIR     TONSILLECTOMY  1950   TOTAL KNEE ARTHROPLASTY     right   TUBAL LIGATION  1980  There were no vitals filed for this visit.   Subjective Assessment - 08/10/21 1405     Subjective The patient felt bad the day of last session and the next day.  She had nausea and imbalance.  She is sleeping in her reclining chair.  She continues to take meclizine 3x/day 1/2 tablet.    Pertinent History MI, cardiac stent (2), GERD, R TKR, R toe surgery, h/o R RTC surgery    Patient Stated Goals Make dizziness go away.    Currently in Pain? No/denies                [facility] PT Assessment - 08/10/21 1411       Assessment   Medical Diagnosis Vertigo    Referring Provider (PT) Luetta Nutting, DO    Onset Date/Surgical Date 06/29/21                 Vestibular Assessment - 08/10/21 1411       Positional Testing   Dix-Hallpike Dix-Hallpike Right;Dix-Hallpike Left    Sidelying Test Sidelying  Right;Sidelying Left    Horizontal Canal Testing Horizontal Canal Right;Horizontal Canal Left      Dix-Hallpike Right   Dix-Hallpike Right Duration none    Dix-Hallpike Right Symptoms No nystagmus      Dix-Hallpike Left   Dix-Hallpike Left Duration none    Dix-Hallpike Left Symptoms No nystagmus      Sidelying Right   Sidelying Right Duration trace for horiz canal    Sidelying Right Symptoms Left nystagmus      Sidelying Left   Sidelying Left Duration none    Sidelying Left Symptoms No nystagmus      Horizontal Canal Right   Horizontal Canal Right Duration 20 second lantency and then had apogeotropic nystagmus  *significantly less intense, smaller amplitude    Horizontal Canal Right Symptoms Ageotrophic      Horizontal Canal Left   Horizontal Canal Left Duration none    Horizontal Canal Left Symptoms Normal                       Vestibular Treatment/Exercise - 08/10/21 1418       Vestibular Treatment/Exercise   Vestibular Treatment Provided Canalith Repositioning;Habituation    Canalith Repositioning Appiani Left    Habituation Exercises Horizontal Roll;Brandt Daroff      Appiani Left   Number of Reps  1    Response Details due to [facility] apogeotropic nystagmus tried R SL, to 45 degree head turn towards the floor.      Nestor Lewandowsky   Number of Reps  2      Horizontal Roll   Number of Reps  3                         PT Long Term Goals - 08/02/21 1335       PT LONG TERM GOAL #1   Title The patient will have negative positional testing.    Time 6    Period Weeks    Target Date 09/13/21      PT LONG TERM GOAL #2   Title The patient will be indep with habituation exercises if indicated.    Time 6    Period Weeks    Target Date 09/13/21      PT LONG TERM GOAL #3   Title The patient will report no dizziness with bending forward.    Time 6    Period  Weeks    Target Date 09/13/21      PT LONG TERM GOAL #4   Title The patient will  report she has returned to driving.    Time 6    Period Weeks    Target Date 09/13/21                   Plan - 08/10/21 1504     Clinical Impression Statement The patient notes improvement since last session.  She has started walking in stores and feels balance is also better.  She has significantly less nystagmus, but still note trace for L horiz cupulolithiasis.  PT to continue progressing to LTGs and provided HEP today for habituation and foced prolonged positioning.    PT Treatment/Interventions ADLs/Self Care Home Management;Patient/family education;Canalith Repostioning;Vestibular;Neuromuscular re-education;Gait training    PT Next Visit Plan recheck positional BPPV and treat most severe canal (has multi-canal L posterior and horizontal at eval)    Consulted and Agree with Plan of Care Patient             Patient will benefit from skilled therapeutic intervention in order to improve the following deficits and impairments:     Visit Diagnosis: BPPV (benign paroxysmal positional vertigo), left  Dizziness and giddiness     Problem List Patient Active Problem List   Diagnosis Date Noted   Vertigo 07/24/2021   Pseudophakia of left eye 05/31/2021   Pseudophakia of right eye 05/31/2021   History of osteoporosis 05/27/2021   Mononucleosis 05/27/2021   Cystoid macular edema of left eye 10/11/2020   MI (myocardial infarction) ([place]) 10/09/2020   COVID-19 virus infection 05/04/2020   Type 2 macular telangiectasis, right 03/20/2020   Cystoid macular edema of right eye 03/20/2020   Type 2 macular telangiectasis, left 03/20/2020   Obstructive sleep apnea, adult 03/20/2020   Cr(e)st syndrome ([place]) 05/25/2018   Ulcer of esophagus with bleeding 02/27/2018   Psychophysiological insomnia 09/24/2017   Abnormal nuclear cardiac imaging test 05/30/2017   Abnormal stress echo    Solar lentigo 11/08/2016   Acute blood loss anemia 10/10/2015   Red blood cell antibody positive  10/04/2015   S/P coronary artery stent placement 07/12/2014   CAD (coronary artery disease), native coronary artery 06/27/2013   Hyperlipidemia 06/27/2013   Chest pain of uncertain etiology [id number]   History of ST elevation myocardial infarction (STEMI) 06/24/2013   Hypertension    Great toe pain 09/11/2012   H/O foot surgery 04/09/2012   Hallux varus (acquired) 02/25/2012   GERD (gastroesophageal reflux disease)    Barrett's esophagus    Cystocele    Rectocele     Malu Pellegrini, PT 08/10/2021, 3:09 PM  [facility] [address] [place], [place], [address] Phone: [phone]   Fax:  [phone]  Name: Kristine [name] MRN: [id number] Date of Birth: 08-27-1941

## 2021-08-13 ENCOUNTER — Ambulatory Visit: Payer: Medicare Other | Admitting: Family Medicine

## 2021-08-15 ENCOUNTER — Other Ambulatory Visit: Payer: Self-pay

## 2021-08-15 ENCOUNTER — Ambulatory Visit: Payer: Medicare Other | Admitting: Rehabilitative and Restorative Service Providers"

## 2021-08-15 DIAGNOSIS — R42 Dizziness and giddiness: Secondary | ICD-10-CM

## 2021-08-15 DIAGNOSIS — H8112 Benign paroxysmal vertigo, left ear: Secondary | ICD-10-CM

## 2021-08-15 NOTE — Therapy (Signed)
[place], [place], [address] Phone: [phone]   Fax:  [phone]  Physical Therapy Treatment  Patient Details  Name: Kristine [name] MRN: [id number] Date of Birth: [birth year] Referring Provider (PT): Luetta Nutting, DO   Encounter Date: 08/15/2021   PT End of Session - 08/15/21 1115     Visit Number 4    Number of Visits 8    Date for PT Re-Evaluation 09/01/21    Authorization Type UHC medicare    PT Start Time 1109    PT Stop Time 1148    PT Time Calculation (min) 39 min    Activity Tolerance Patient tolerated treatment well    Behavior During Therapy [facility] for tasks assessed/performed             Past Medical History:  Diagnosis Date   Anemia    Arthritis    Barrett's esophagus    Stage B   CAD (coronary artery disease)    a. s/p STEMI 7/14 >> LHC: LM 20, mLAD 30-40, small Dx 100 (L-L collats), OM1 90, OM2 30-40, mRCA 50-60, Lat/AL akinesis, EF 35-40% >> PCI: Promus DES to OM1;  b. Myoview 3/16: diaph atten, no ischemia, EF 59% (intermediate risk b/c BP drop 152>>137, but recovery BP 162 (tech error) >> med Rx continued  c. cath 05/30/17 s/p FFR guided PCI with DES to circumflex/first OM    COVID-19 virus infection    CREST variant of scleroderma ([place])    Cystocele    Diverticulitis    Event monitor 04/2019   Event monitor 04/2019: Sinus rhythm; no atrial fibrillation/flutter; no bradycardic events; few PVCs and occasional ventricular runs up to 9 beats   GERD (gastroesophageal reflux disease)    History of blood transfusion    hx of being Anti-K positive   History of Doppler ultrasound    a. Carotid US 2/12: bilateral ICA < 50%   Hyperlipidemia    Hypertension    Ischemic cardiomyopathy    a. EF 35-40% by LHC at time of MI;  b. Echo 7/14:  Mild focal basal septal hypertrophy, EF 50-55%, apical HK, grade 1 diastolic dysfunction, trivial AI    Ischemic colitis ([place])    Orthostatic  hypotension    OSA on CPAP    Osteoporosis 12/2017   T score -2.6   Raynaud disease    Rectocele    mild    Past Surgical History:  Procedure Laterality Date   ABDOMINAL HYSTERECTOMY  1996   RSO for menorrhagia.   CORONARY ANGIOPLASTY WITH STENT PLACEMENT  06/21/2013   CORONARY STENT INTERVENTION N/A 05/30/2017   Procedure: Coronary Stent Intervention;  Surgeon: [name], MD;  Location: [place] [facility];  Service: Cardiovascular;  Laterality: N/A;   FOOT SURGERY  2013   x2   INTRAVASCULAR PRESSURE WIRE/FFR STUDY N/A 05/30/2017   Procedure: Intravascular Pressure Wire/FFR Study;  Surgeon: [name], MD;  Location: [place] [facility];  Service: Cardiovascular;  Laterality: N/A;   JOINT REPLACEMENT  9/10   rt total   LEFT HEART CATH AND CORONARY ANGIOGRAPHY N/A 05/30/2017   Procedure: Left Heart Cath and Coronary Angiography;  Surgeon: [name], MD;  Location: [place] [facility];  Service: Cardiovascular;  Laterality: N/A;   nissen fundoplication  6599   ROTATOR CUFF REPAIR     TONSILLECTOMY  1950   TOTAL KNEE ARTHROPLASTY     right   TUBAL LIGATION  1980  There were no vitals filed for this visit.   Subjective Assessment - 08/15/21 1112     Subjective The patient reports the habituation exercises make her nauseous.  The first 2 reps of exercises provoke nausea and then it begins to settle with repititions.    Pertinent History MI, cardiac stent (2), GERD, R TKR, R toe surgery, h/o R RTC surgery    Patient Stated Goals Make dizziness go away.    Currently in Pain? No/denies                [facility] PT Assessment - 08/15/21 1120       Assessment   Medical Diagnosis Vertigo    Referring Provider (PT) Luetta Nutting, DO    Onset Date/Surgical Date 06/29/21                 Vestibular Assessment - 08/15/21 1120       Vestibular Assessment   General Observation walks into clinic without imbalance      Positional Testing   Sidelying Test  Sidelying Right;Sidelying Left    Horizontal Canal Testing Horizontal Canal Right;Horizontal Canal Left      Sidelying Right   Sidelying Right Duration none    Sidelying Right Symptoms No nystagmus      Sidelying Left   Sidelying Left Duration none    Sidelying Left Symptoms No nystagmus      Horizontal Canal Right   Horizontal Canal Right Duration "funny feeling" with some nausea on 3rd rep    Horizontal Canal Right Symptoms Ageotrophic      Horizontal Canal Left   Horizontal Canal Left Duration notes some visual movement x 30 seconds    Horizontal Canal Left Symptoms Ageotrophic                       Vestibular Treatment/Exercise - 08/15/21 1127       Vestibular Treatment/Exercise   Vestibular Treatment Provided Canalith Repositioning;Habituation    Canalith Repositioning Appiani Right;Appiani Left    Habituation Exercises Horizontal Roll      Appiani Right   Number of Reps  1    Response Details  due to [facility] apogeotropic nystagmus      Appiani Left   Number of Reps  1    Response Details due to [facility] apogeotropic nystagmus      Horizontal Roll   Number of Reps  4    Symptom Description  some nausea and sensation of visual movement                         PT Long Term Goals - 08/02/21 1335       PT LONG TERM GOAL #1   Title The patient will have negative positional testing.    Time 6    Period Weeks    Target Date 09/13/21      PT LONG TERM GOAL #2   Title The patient will be indep with habituation exercises if indicated.    Time 6    Period Weeks    Target Date 09/13/21      PT LONG TERM GOAL #3   Title The patient will report no dizziness with bending forward.    Time 6    Period Weeks    Target Date 09/13/21      PT LONG TERM GOAL #4   Title The patient will report she has returned to driving.    Time  6    Period Weeks    Target Date 09/13/21                   Plan - 08/15/21 1134     Clinical Impression  Statement The patient's symptoms are improved but not resolved.  PT encouraged continued participation in HEP, although first 2 reps do provoke some nausea (without vomitting).  We discussed continuing to sleep on the L side for forced prolonged positioning.    PT Treatment/Interventions ADLs/Self Care Home Management;Patient/family education;Canalith Repostioning;Vestibular;Neuromuscular re-education;Gait training    PT Next Visit Plan recheck positional BPPV and treat most severe canal (has multi-canal L posterior and horizontal at eval)             Patient will benefit from skilled therapeutic intervention in order to improve the following deficits and impairments:     Visit Diagnosis: BPPV (benign paroxysmal positional vertigo), left  Dizziness and giddiness     Problem List Patient Active Problem List   Diagnosis Date Noted   Vertigo 07/24/2021   Pseudophakia of left eye 05/31/2021   Pseudophakia of right eye 05/31/2021   History of osteoporosis 05/27/2021   Mononucleosis 05/27/2021   Cystoid macular edema of left eye 10/11/2020   MI (myocardial infarction) ([place]) 10/09/2020   COVID-19 virus infection 05/04/2020   Type 2 macular telangiectasis, right 03/20/2020   Cystoid macular edema of right eye 03/20/2020   Type 2 macular telangiectasis, left 03/20/2020   Obstructive sleep apnea, adult 03/20/2020   Cr(e)st syndrome ([place]) 05/25/2018   Ulcer of esophagus with bleeding 02/27/2018   Psychophysiological insomnia 09/24/2017   Abnormal nuclear cardiac imaging test 05/30/2017   Abnormal stress echo    Solar lentigo 11/08/2016   Acute blood loss anemia 10/10/2015   Red blood cell antibody positive 10/04/2015   S/P coronary artery stent placement 07/12/2014   CAD (coronary artery disease), native coronary artery 06/27/2013   Hyperlipidemia 06/27/2013   Chest pain of uncertain etiology 62/83/6629   History of ST elevation myocardial infarction (STEMI) 06/24/2013    Hypertension    Great toe pain 09/11/2012   H/O foot surgery 04/09/2012   Hallux varus (acquired) 02/25/2012   GERD (gastroesophageal reflux disease)    Barrett's esophagus    Cystocele    Rectocele     Kiondra Caicedo, PT 08/15/2021, 12:15 PM  [facility] [address] [place], [place], [address] Phone: [phone]   Fax:  [phone]  Name: Kristine [name] MRN: [id number] Date of Birth: 05-Jul-1941

## 2021-08-22 ENCOUNTER — Ambulatory Visit: Payer: Medicare Other | Admitting: Rehabilitative and Restorative Service Providers"

## 2021-08-22 ENCOUNTER — Encounter: Payer: Self-pay | Admitting: Rehabilitative and Restorative Service Providers"

## 2021-08-22 ENCOUNTER — Other Ambulatory Visit: Payer: Self-pay

## 2021-08-22 DIAGNOSIS — R42 Dizziness and giddiness: Secondary | ICD-10-CM | POA: Diagnosis not present

## 2021-08-22 DIAGNOSIS — H8112 Benign paroxysmal vertigo, left ear: Secondary | ICD-10-CM | POA: Diagnosis not present

## 2021-08-22 NOTE — Therapy (Addendum)
[place], [place], [address] Phone: [phone]   Fax:  [phone]  Physical Therapy Treatment and Discharge Summary  Patient Details  Name: Kristine [name] MRN: [id number] Date of Birth: 10/07/1941 Referring Provider (PT): Luetta Nutting, DO   Encounter Date: 08/22/2021   PT End of Session - 08/22/21 1109     Visit Number 5    Number of Visits 8    Date for PT Re-Evaluation 09/01/21    Authorization Type UHC medicare    PT Start Time 1106    PT Stop Time 1138    PT Time Calculation (min) 32 min    Activity Tolerance Patient tolerated treatment well    Behavior During Therapy [facility] for tasks assessed/performed             Past Medical History:  Diagnosis Date   Anemia    Arthritis    Barrett's esophagus    Stage B   CAD (coronary artery disease)    a. s/p STEMI 7/14 >> LHC: LM 20, mLAD 30-40, small Dx 100 (L-L collats), OM1 90, OM2 30-40, mRCA 50-60, Lat/AL akinesis, EF 35-40% >> PCI: Promus DES to OM1;  b. Myoview 3/16: diaph atten, no ischemia, EF 59% (intermediate risk b/c BP drop 152>>137, but recovery BP 162 (tech error) >> med Rx continued  c. cath 05/30/17 s/p FFR guided PCI with DES to circumflex/first OM    COVID-19 virus infection    CREST variant of scleroderma ([place])    Cystocele    Diverticulitis    Event monitor 04/2019   Event monitor 04/2019: Sinus rhythm; no atrial fibrillation/flutter; no bradycardic events; few PVCs and occasional ventricular runs up to 9 beats   GERD (gastroesophageal reflux disease)    History of blood transfusion    hx of being Anti-K positive   History of Doppler ultrasound    a. Carotid US 2/12: bilateral ICA < 50%   Hyperlipidemia    Hypertension    Ischemic cardiomyopathy    a. EF 35-40% by LHC at time of MI;  b. Echo 7/14:  Mild focal basal septal hypertrophy, EF 50-55%, apical HK, grade 1 diastolic dysfunction, trivial AI    Ischemic colitis ([place])     Orthostatic hypotension    OSA on CPAP    Osteoporosis 12/2017   T score -2.6   Raynaud disease    Rectocele    mild    Past Surgical History:  Procedure Laterality Date   ABDOMINAL HYSTERECTOMY  1996   RSO for menorrhagia.   CORONARY ANGIOPLASTY WITH STENT PLACEMENT  06/21/2013   CORONARY STENT INTERVENTION N/A 05/30/2017   Procedure: Coronary Stent Intervention;  Surgeon: [name], MD;  Location: [place] [facility];  Service: Cardiovascular;  Laterality: N/A;   FOOT SURGERY  2013   x2   INTRAVASCULAR PRESSURE WIRE/FFR STUDY N/A 05/30/2017   Procedure: Intravascular Pressure Wire/FFR Study;  Surgeon: [name], MD;  Location: [place] [facility];  Service: Cardiovascular;  Laterality: N/A;   JOINT REPLACEMENT  9/10   rt total   LEFT HEART CATH AND CORONARY ANGIOGRAPHY N/A 05/30/2017   Procedure: Left Heart Cath and Coronary Angiography;  Surgeon: [name], MD;  Location: [place] [facility];  Service: Cardiovascular;  Laterality: N/A;   nissen fundoplication  8676   ROTATOR CUFF REPAIR     TONSILLECTOMY  1950   TOTAL KNEE ARTHROPLASTY     right   TUBAL LIGATION  1980  There were no vitals filed for this visit.   Subjective Assessment - 08/22/21 1108     Subjective The patient reports she drove and thinks dizziness is improved.  She thinks it may be resolved, "but we'll see".    Pertinent History MI, cardiac stent (2), GERD, R TKR, R toe surgery, h/o R RTC surgery    Patient Stated Goals Make dizziness go away.    Currently in Pain? No/denies                [facility] PT Assessment - 08/22/21 1110       Assessment   Medical Diagnosis Vertigo    Referring Provider (PT) Luetta Nutting, DO    Onset Date/Surgical Date 06/29/21                 Vestibular Assessment - 08/22/21 1110       Positional Testing   Sidelying Test Sidelying Right;Sidelying Left    Horizontal Canal Testing Horizontal Canal Right;Horizontal Canal Left      Horizontal  Canal Right   Horizontal Canal Right Duration low amplitude nystagmus viewed in room light apogeotropic in nature indicating continued horiz cupulolithiasis    Horizontal Canal Right Symptoms Ageotrophic      Horizontal Canal Left   Horizontal Canal Left Duration low amplitude nystagmus noted- no subjective symptoms    Horizontal Canal Left Symptoms Ageotrophic                      [facility] Adult PT Treatment/Exercise - 08/22/21 1126       Neuro Re-ed    Neuro Re-ed Details  Habituation in the corner for horizontal head turns, vertical head turns, and 180 degree turns.  Single leg stance activities near suppport surface.             Vestibular Treatment/Exercise - 08/22/21 1129       Vestibular Treatment/Exercise   Vestibular Treatment Provided Habituation;Gaze    Habituation Exercises Horizontal Roll;Standing Vertical Head Turns;Standing Horizontal Head Turns;180 degree Turns    Gaze Exercises X1 Viewing Horizontal      Horizontal Roll   Number of Reps  2      Standing Horizontal Head Turns   Number of Reps  10      Standing Vertical Head Turns   Number of Reps  10      180 degree Turns   Number of Reps  10      X1 Viewing Horizontal   Foot Position sitting and then progressed to standing    Comments 30 seconds                    PT Education - 08/22/21 1131     Education Details HEP    Person(s) Educated Patient    Methods Explanation;Demonstration;Handout    Comprehension Verbalized understanding;Returned demonstration                 PT Long Term Goals - 08/22/21 1132       PT LONG TERM GOAL #1   Title The patient will have negative positional testing.    Baseline No subjective reports.  L posterior canal vertigo appears cleared; patient continues with low amplitude nystagmus    Time 6    Period Weeks    Status Partially Met      PT LONG TERM GOAL #2   Title The patient will be indep with habituation exercises if  indicated.    Time  6    Period Weeks    Status Achieved      PT LONG TERM GOAL #3   Title The patient will report no dizziness with bending forward.    Time 6    Period Weeks    Status Achieved      PT LONG TERM GOAL #4   Title The patient will report she has returned to driving.    Time 6    Period Weeks    Status Achieved                   Plan - 08/22/21 1143     Clinical Impression Statement The patient is no longer experiencing vertigo symptoms, however with positional testing she does have a low amplitude apogeotropic nystagmus worse with R horizontal roll indicating L horiz cupulolithiasis remaining.  L posterior canal BPPV appears resolved.  Patient has partially  met LTGs with return to driving, no dizziness with bed mobility.  PT discussed return to recreational activity of dance and how she could work on braiding and turns at home.  She was able to safely perform 180 degree turns and horz/vertical head turns without dizziness.    PT Treatment/Interventions ADLs/Self Care Home Management;Patient/family education;Canalith Repostioning;Vestibular;Neuromuscular re-education;Gait training    PT Next Visit Plan on hold-- will return if symptoms recur over next 3 weeks    Consulted and Agree with Plan of Care Patient             Patient will benefit from skilled therapeutic intervention in order to improve the following deficits and impairments:     Visit Diagnosis: BPPV (benign paroxysmal positional vertigo), left  Dizziness and giddiness     Problem List Patient Active Problem List   Diagnosis Date Noted   Vertigo 07/24/2021   Pseudophakia of left eye 05/31/2021   Pseudophakia of right eye 05/31/2021   History of osteoporosis 05/27/2021   Mononucleosis 05/27/2021   Cystoid macular edema of left eye 10/11/2020   MI (myocardial infarction) ([place]) 10/09/2020   COVID-19 virus infection 05/04/2020   Type 2 macular telangiectasis, right 03/20/2020    Cystoid macular edema of right eye 03/20/2020   Type 2 macular telangiectasis, left 03/20/2020   Obstructive sleep apnea, adult 03/20/2020   Cr(e)st syndrome ([place]) 05/25/2018   Ulcer of esophagus with bleeding 02/27/2018   Psychophysiological insomnia 09/24/2017   Abnormal nuclear cardiac imaging test 05/30/2017   Abnormal stress echo    Solar lentigo 11/08/2016   Acute blood loss anemia 10/10/2015   Red blood cell antibody positive 10/04/2015   S/P coronary artery stent placement 07/12/2014   CAD (coronary artery disease), native coronary artery 06/27/2013   Hyperlipidemia 06/27/2013   Chest pain of uncertain etiology 70/62/3762   History of ST elevation myocardial infarction (STEMI) 06/24/2013   Hypertension    Great toe pain 09/11/2012   H/O foot surgery 04/09/2012   Hallux varus (acquired) 02/25/2012   GERD (gastroesophageal reflux disease)    Barrett's esophagus    Cystocele    Rectocele     PHYSICAL THERAPY DISCHARGE SUMMARY  Visits from Start of Care: 5  Current functional level related to goals / functional outcomes: See goals above   Remaining deficits: Symptoms resolved at last encounter.   Education / Equipment: HEP   Patient agrees to discharge. Patient goals were partially met. Patient is being discharged due to meeting the stated rehab goals.   [place], [place] 08/22/2021, 1:27 PM  [place] [phone]  [place], [place], [address] Phone: [phone]   Fax:  [phone]  Name: Kristine [name] MRN: [id number] Date of Birth: 01/03/1941

## 2021-08-22 NOTE — Patient Instructions (Signed)
Access Code: [email or web address] URL: [email or web address] Date: 08/22/2021 Prepared by: Rudell Cobb  Program Notes Try to sleep on your left side until I see you back in the clinic.    Exercises Rolling right<>left sides for vestibular habituation - 1 x daily - 7 x weekly - 1 sets - 5 reps Standing Single Leg Stance with Counter Support - 1 x daily - 7 x weekly - 1 sets - 3 reps - 10 seconds hold

## 2021-08-30 NOTE — Progress Notes (Signed)
Cardiology Office Note    Date:  09/12/2021   ID:  [name], DOB August 09, 1941, MRN [id number]   PCP:  Luetta Nutting, [facility]  Cardiologist:  [name], MD   Advanced Practice Provider:  No care team member to display Electrophysiologist:  None   [phone]   Chief Complaint  Patient presents with   Follow-up     History of Present Illness:  Kristine [name] is a 80 y.o. female with history of CAD with STEMI s/p PCI/stent to OM 2014, OM stenosis proximal to prior stent 2018 s/p PCI, CREST syndrome, HTN with h/o hypotension (taken off ARB), dyslipidemia (intolerant to higher dose statin), anemia, arthritis, Barrett's esophagus, ischemic cardiomyopathy (EF 35-40% at time of 2014 MI, normal by stress echo 2018), Raynaud disease, ischemic colitis, diverticulitis, anemia by labs 02/2019 who presents for follow-up of chest pain. She also reports a remote history of a blood disorder called antigen K. She was seen in 03/2019 via telehealth for syncope and was found to have newly low Hgb in the 8 range. She  went on iron supplementation with resolution in anemia.  Patient was last seen by Ms. Dunn, PA-C 05/2020 with atypical chest pain. Currently being treated for  BPPV with vestibular therapy.  Patient comes in for f/u. Denies chest pain, dyspnea, edema, palpitations. BP running 140/60's at times. Just got back from a week at [facility] and was eating out a lot. Not exercising as she had covid19/mono/UTI/vertigo. Just slipped and twisted her ankle. Was ballroom dancing but unable to in past 6 months.    Past Medical History:  Diagnosis Date   Anemia    Arthritis    Barrett's esophagus    Stage B   CAD (coronary artery disease)    a. s/p STEMI 7/14 >> LHC: LM 20, mLAD 30-40, small Dx 100 (L-L collats), OM1 90, OM2 30-40, mRCA 50-60, Lat/AL akinesis, EF 35-40% >> PCI: Promus DES to OM1;  b. Myoview 3/16: diaph atten, no ischemia, EF 59% (intermediate risk  b/c BP drop 152>>137, but recovery BP 162 (tech error) >> med Rx continued  c. cath 05/30/17 s/p FFR guided PCI with DES to circumflex/first OM    COVID-19 virus infection    CREST variant of scleroderma ([place])    Cystocele    Diverticulitis    Event monitor 04/2019   Event monitor 04/2019: Sinus rhythm; no atrial fibrillation/flutter; no bradycardic events; few PVCs and occasional ventricular runs up to 9 beats   GERD (gastroesophageal reflux disease)    History of blood transfusion    hx of being Anti-K positive   History of Doppler ultrasound    a. Carotid US 2/12: bilateral ICA < 50%   Hyperlipidemia    Hypertension    Ischemic cardiomyopathy    a. EF 35-40% by LHC at time of MI;  b. Echo 7/14:  Mild focal basal septal hypertrophy, EF 50-55%, apical HK, grade 1 diastolic dysfunction, trivial AI    Ischemic colitis ([place])    Orthostatic hypotension    OSA on CPAP    Osteoporosis 12/2017   T score -2.6   Raynaud disease    Rectocele    mild    Past Surgical History:  Procedure Laterality Date   ABDOMINAL HYSTERECTOMY  1996   RSO for menorrhagia.   CORONARY ANGIOPLASTY WITH STENT PLACEMENT  06/21/2013   CORONARY STENT INTERVENTION N/A 05/30/2017   Procedure: Coronary Stent Intervention;  Surgeon: [name],  MD;  Location: [facility];  Service: Cardiovascular;  Laterality: N/A;   FOOT SURGERY  2013   x2   INTRAVASCULAR PRESSURE WIRE/FFR STUDY N/A 05/30/2017   Procedure: Intravascular Pressure Wire/FFR Study;  Surgeon: [name], MD;  Location: [facility];  Service: Cardiovascular;  Laterality: N/A;   JOINT REPLACEMENT  9/10   rt total   LEFT HEART CATH AND CORONARY ANGIOGRAPHY N/A 05/30/2017   Procedure: Left Heart Cath and Coronary Angiography;  Surgeon: [name], MD;  Location: [facility];  Service: Cardiovascular;  Laterality: N/A;   nissen fundoplication  3419   ROTATOR CUFF REPAIR     TONSILLECTOMY  1950   TOTAL KNEE ARTHROPLASTY      right   TUBAL LIGATION  1980    Current Medications: Current Meds  Medication Sig   acetaminophen (TYLENOL) 500 MG tablet Take 1,000 mg by mouth every 6 (six) hours as needed for headache.   ALPRAZolam (XANAX) 0.25 MG tablet Take 1 tablet (0.25 mg total) by mouth daily as needed for anxiety.   aspirin EC 81 MG EC tablet Take 1 tablet (81 mg total) by mouth daily.   cetirizine (ZYRTEC) 10 MG tablet Take 1 tablet (10 mg total) by mouth daily.   ezetimibe (ZETIA) 10 MG tablet TAKE 1/2 TABLET EVERY DAY   famotidine (PEPCID) 40 MG tablet Take 1 tablet by mouth at bedtime.   fluticasone (FLONASE) 50 MCG/ACT nasal spray Place 2 sprays into both nostrils daily.   meclizine (ANTIVERT) 25 MG tablet Take 0.5-1 tablets (12.5-25 mg total) by mouth 3 (three) times daily as needed for dizziness.   nitroGLYCERIN (NITROSTAT) 0.4 MG SL tablet Place 1 tablet (0.4 mg total) under the tongue every 5 (five) minutes x 3 doses as needed for chest pain.   pantoprazole (PROTONIX) 40 MG tablet Take 40 mg by mouth daily.   pravastatin (PRAVACHOL) 20 MG tablet Take 1 tablet (20 mg total) by mouth every evening.     Allergies:   Nsaids and Aspirin   Social History   Socioeconomic History   Marital status: Divorced    Spouse name: Not on file   Number of children: Not on file   Years of education: Not on file   Highest education level: Not on file  Occupational History   Not on file  Tobacco Use   Smoking status: Never   Smokeless tobacco: Never  Vaping Use   Vaping Use: Never used  Substance and Sexual Activity   Alcohol use: No    Alcohol/week: 0.0 standard drinks   Drug use: No   Sexual activity: Never    Comment: 1st intercourse 68 yo-1 partner  Other Topics Concern   Not on file  Social History Narrative   Exercise dancing 3 x times weekly for 1-2 hours   Social Determinants of Health   Financial Resource Strain: Not on file  Food Insecurity: Not on file  Transportation Needs: Not on file   Physical Activity: Not on file  Stress: Not on file  Social Connections: Not on file     Family History:  The patient's  family history includes Asthma in her maternal grandmother; Cancer in her maternal grandfather and mother; Emphysema in her maternal grandmother; Heart attack in her father; Heart disease in her father; Hepatitis C in her mother; Hypertension in her daughter; Panic disorder in her daughter.   ROS:   Please see the history of present illness.    ROS  All other systems reviewed and are negative.   PHYSICAL EXAM:   VS:  BP 120/68 (BP Location: Left Arm, Patient Position: Sitting, Cuff Size: Normal)   Pulse (!) 104   Ht 5\' 1"  (1.549 m)   Wt 129 lb (58.5 kg)   LMP 11/25/1994   BMI 24.37 kg/m   Physical Exam  GEN: Well nourished, well developed, in no acute distress  Neck: no JVD, carotid bruits, or masses Cardiac:RRR; no murmurs, rubs, or gallops  Respiratory:  clear to auscultation bilaterally, normal work of breathing GI: soft, nontender, nondistended, + BS Ext: without cyanosis, clubbing, or edema, Good distal pulses bilaterally Neuro:  Alert and Oriented x 3 Psych: euthymic mood, full affect  Wt Readings from Last 3 Encounters:  09/12/21 129 lb (58.5 kg)  07/24/21 125 lb 9.6 oz (57 kg)  07/14/21 126 lb (57.2 kg)      Studies/Labs Reviewed:   EKG:  EKG is  ordered today.  The ekg ordered today demonstrates sinus tachycardia 104/m IVCD unchanged  Recent Labs: 05/24/2021: ALT 10; BUN 14; Creat 0.75; Hemoglobin 11.8; Platelets 193; Potassium 4.7; Sodium 142; TSH 2.30   Lipid Panel    Component Value Date/Time   CHOL 172 05/24/2021 0000   CHOL 148 04/28/2017 1003   CHOL 153 07/29/2014 0926   TRIG 189 (H) 05/24/2021 0000   TRIG 112 07/29/2014 0926   HDL 54 05/24/2021 0000   HDL 45 04/28/2017 1003   HDL 49 07/29/2014 0926   CHOLHDL 3.2 05/24/2021 0000   VLDL 17 11/23/2015 0912   LDLCALC 90 05/24/2021 0000   LDLCALC 82 07/29/2014 0926     Additional studies/ records that were reviewed today include:    LHC 05/2017 1. Single-vessel coronary artery disease with moderately severe proximal left circumflex stenosis at the proximal edge of the previously implanted stent 2. Mild nonobstructive LAD and RCA stenoses 3. Normal LV systolic function based on noninvasive assessment 4. Successful FFR guided PCI with a 3.5 x 16 mm Promus DES in the proximal circumflex/first OM   Dual antiplatelet therapy with aspirin and Plavix for a minimum of 6 months   Stress Echo 04/2017 (prompting cath above) - Abnormal stress echo.    She has normal LV function at rest.    The stress apical 2 images is slightly rotated but shows    hypokinesis in the apical inferior ( inferior septal ) region    Suggest cardiac cath  Recommendations:  Suggest Cardiac cath    2D Echo 05/2013 Study Conclusions    - Left ventricle: The cavity size was normal. There was mild    focal basal hypertrophy of the septum. Systolic function    was normal. The estimated ejection fraction was in the    range of 50% to 55%. There is mild hypokinesis of the    apical myocardium. Doppler parameters are consistent with    abnormal left ventricular relaxation (grade 1 diastolic    dysfunction).  - Aortic valve: Trivial regurgitation.           Risk Assessment/Calculations:         ASSESSMENT:    1. Coronary artery disease involving native coronary artery of native heart without angina pectoris   2. Ischemic cardiomyopathy   3. Essential hypertension   4. Hyperlipidemia, unspecified hyperlipidemia type      PLAN:  In order of problems listed above:  CAD STEMI DES OM 2014, stenosis prox to prior stent 2018 S/P PCI-no angina. 150 min  exercise weekly. Continue ASA/statin  ICM EF 35-405 2014 normal on echo 2018-no CHF symptoms  HTN with history of hypotension-she'll keep track of her BP and pulse and let us know how they are doing  HLD LDL 90 04/2021 on  pravastatin and zetia-doesn't want to increase meds. She says she'd like to try diet first.   Shared Decision Making/Informed Consent        Medication Adjustments/Labs and Tests Ordered: Current medicines are reviewed at length with the patient today.  Concerns regarding medicines are outlined above.  Medication changes, Labs and Tests ordered today are listed in the Patient Instructions below. Patient Instructions  Medication Instructions:  Your physician recommends that you continue on your current medications as directed. Please refer to the Current Medication list given to you today.  *If you need a refill on your cardiac medications before your next appointment, please call your pharmacy*   Lab Work: Your physician recommends that you return for a FASTING lipid profile in April 2023.  If you have labs (blood work) drawn today and your tests are completely normal, you will receive your results only by: [place] (if you have MyChart) OR A paper copy in the mail If you have any lab test that is abnormal or we need to change your treatment, we will call you to review the results.   Follow-Up: At [facility], you and your health needs are our priority.  As part of our continuing mission to provide you with exceptional heart care, we have created designated Provider Care Teams.  These Care Teams include your primary Cardiologist (physician) and Advanced Practice Providers (APPs -  Physician Assistants and Nurse Practitioners) who all work together to provide you with the care you need, when you need it.   Your next appointment:   6 month(s)  The format for your next appointment:   In Person  Provider:   You may see [name], MD or one of the following Advanced Practice Providers on your designated Care Team:   Richardson Dopp, PA-C Robbie Lis, [place]   Other Instructions Heart-Healthy Eating Plan Heart-healthy meal planning includes: Eating less unhealthy  fats. Eating more healthy fats. Making other changes in your diet.  Cooking Avoid frying your food. Try to bake, boil, grill, or broil it instead. You can also reduce fat by: Removing the skin from poultry. Removing all visible fats from meats. Steaming vegetables in water or broth. Meal planning  At meals, divide your plate into four equal parts: Fill one-half of your plate with vegetables and green salads. Fill one-fourth of your plate with whole grains. Fill one-fourth of your plate with lean protein foods. Eat 4-5 servings of vegetables per day. A serving of vegetables is: 1 cup of raw or cooked vegetables. 2 cups of raw leafy greens. Eat 4-5 servings of fruit per day. A serving of fruit is: 1 medium whole fruit.  cup of dried fruit.  cup of fresh, frozen, or canned fruit.  cup of 100% fruit juice. Eat more foods that have soluble fiber. These are apples, broccoli, carrots, beans, peas, and barley. Try to get 20-30 g of fiber per day. Eat 4-5 servings of nuts, legumes, and seeds per week: 1 serving of dried beans or legumes equals  cup after being cooked. 1 serving of nuts is  cup. 1 serving of seeds equals 1 tablespoon. General information Eat more home-cooked food. Eat less restaurant, buffet, and fast food. Limit or avoid alcohol. Limit foods that  are high in starch and sugar. Avoid fried foods. Lose weight if you are overweight. Keep track of how much salt (sodium) you eat. This is important if you have high blood pressure. Ask your doctor to tell you more about this. Try to add vegetarian meals each week. Fats Choose healthy fats. These include olive oil and canola oil, flaxseeds, walnuts, almonds, and seeds. Eat more omega-3 fats. These include salmon, mackerel, sardines, tuna, flaxseed oil, and ground flaxseeds. Try to eat fish at least 2 times each week. Check food labels. Avoid foods with trans fats or high amounts of saturated fat. Limit saturated  fats. These are often found in animal products, such as meats, butter, and cream. These are also found in plant foods, such as palm oil, palm kernel oil, and coconut oil. Avoid foods with partially hydrogenated oils in them. These have trans fats. Examples are stick margarine, some tub margarines, cookies, crackers, and other baked goods. What foods can I eat? Fruits All fresh, canned (in natural juice), or frozen fruits. Vegetables Fresh or frozen vegetables (raw, steamed, roasted, or grilled). Green salads. Grains Most grains. Choose whole wheat and whole grains most of the time. Rice and pasta, including brown rice and pastas made with whole wheat. Meats and other proteins Lean, well-trimmed beef, veal, pork, and lamb. Chicken and [place] without skin. All fish and shellfish. Wild duck, rabbit, pheasant, and venison. Egg whites or low-cholesterol egg substitutes. Dried beans, peas, lentils, and tofu. Seeds and most nuts. Dairy Low-fat or nonfat cheeses, including ricotta and mozzarella. Skim or 1% milk that is liquid, powdered, or evaporated. Buttermilk that is made with low-fat milk. Nonfat or low-fat yogurt. Fats and oils Non-hydrogenated (trans-free) margarines. Vegetable oils, including soybean, sesame, sunflower, olive, peanut, safflower, corn, canola, and cottonseed. Salad dressings or mayonnaise made with a vegetable oil. Beverages Mineral water. Coffee and tea. Diet carbonated beverages. Sweets and desserts Sherbet, gelatin, and fruit ice. Small amounts of dark chocolate. Limit all sweets and desserts. Seasonings and condiments All seasonings and condiments. The items listed above may not be a complete list of foods and drinks you can eat. Contact a dietitian for more options. What foods should I avoid? Fruits Canned fruit in heavy syrup. Fruit in cream or butter sauce. Fried fruit. Limit coconut. Vegetables Vegetables cooked in cheese, cream, or butter sauce. Fried  vegetables. Grains Breads that are made with saturated or trans fats, oils, or whole milk. Croissants. Sweet rolls. Donuts. High-fat crackers, such as cheese crackers. Meats and other proteins Fatty meats, such as hot dogs, ribs, sausage, bacon, rib-eye roast or steak. High-fat deli meats, such as salami and bologna. Caviar. Domestic duck and goose. Organ meats, such as liver. Dairy Cream, sour cream, cream cheese, and creamed cottage cheese. Whole-milk cheeses. Whole or 2% milk that is liquid, evaporated, or condensed. Whole buttermilk. Cream sauce or high-fat cheese sauce. Yogurt that is made from whole milk. Fats and oils Meat fat, or shortening. Cocoa butter, hydrogenated oils, palm oil, coconut oil, palm kernel oil. Solid fats and shortenings, including bacon fat, salt pork, lard, and butter. Nondairy cream substitutes. Salad dressings with cheese or sour cream. Beverages Regular sodas and juice drinks with added sugar. Sweets and desserts Frosting. Pudding. Cookies. Cakes. Pies. Milk chocolate or white chocolate. Buttered syrups. Full-fat ice cream or ice cream drinks. The items listed above may not be a complete list of foods and drinks to avoid. Contact a dietitian for more information. Summary Heart-healthy meal planning includes eating  less unhealthy fats, eating more healthy fats, and making other changes in your diet. Eat a balanced diet. This includes fruits and vegetables, low-fat or nonfat dairy, lean protein, nuts and legumes, whole grains, and heart-healthy oils and fats. This information is not intended to replace advice given to you by your health care provider. Make sure you discuss any questions you have with your health care provider. Document Revised: 03/22/2021 Document Reviewed: 03/22/2021 Elsevier Patient Education  2022 [place], Ermalinda Barrios, [place]  09/12/2021 2:23 PM    [facility] [place], [place], [place]   [address] Phone: [phone]; Fax: [phone]

## 2021-09-04 ENCOUNTER — Encounter: Payer: Self-pay | Admitting: Emergency Medicine

## 2021-09-04 ENCOUNTER — Emergency Department
Admission: EM | Admit: 2021-09-04 | Discharge: 2021-09-04 | Disposition: A | Discharge status: Home / Self Care | Payer: Medicare Other | Source: Home / Self Care | Attending: Family Medicine | Admitting: Family Medicine

## 2021-09-04 ENCOUNTER — Other Ambulatory Visit: Payer: Self-pay

## 2021-09-04 DIAGNOSIS — J301 Allergic rhinitis due to pollen: Secondary | ICD-10-CM

## 2021-09-04 MED ORDER — PREDNISONE 20 MG PO TABS: 20.0000 mg | ORAL_TABLET | Freq: Two times a day (BID) | ORAL | 0 refills | Status: DC

## 2021-09-04 MED ORDER — FLUTICASONE PROPIONATE 50 MCG/ACT NA SUSP: 2.0000 | Freq: Every day | NASAL | 0 refills | Status: DC

## 2021-09-04 MED ORDER — CETIRIZINE HCL 10 MG PO TABS: 10.0000 mg | ORAL_TABLET | Freq: Every day | ORAL | 0 refills | Status: DC

## 2021-09-04 NOTE — ED Triage Notes (Signed)
Patient presents to Urgent Care with complaints of sneezing, runny nose and cough since 1 week ago. Patient reports her grandson lives with her and he has been sick for a month now. She started having sneezing attacks, runny nose and coughing started last night. Has been using Saline nasal spray and Tylenol.

## 2021-09-04 NOTE — Discharge Instructions (Addendum)
Try to increase your fluid intake Take the prednisone 2 times a day for 5 days. For the allergies in general, take cetirizine 1 pill a day during allergy season May also use Flonase once a day for nasal symptoms and runny nose and sneezing Follow-up with your primary care doctor for regular medical needs

## 2021-09-04 NOTE — ED Provider Notes (Signed)
[name]    CSN: [id number] Arrival date & time: 09/04/21  0813      History   Chief Complaint Chief Complaint  Patient presents with   Cough    HPI Kristine [name] is a 80 y.o. female.   HPI  Patient states that ragweed is blooming in her backyard.  Although she does not recall allergy to ragweed she has been having sneezing attacks where she sneezes several times throat, runny nose, postnasal drip, and cough.  Is been going on for over a week.  She has been using some saline nasal spray.  Tylenol for headache.  She states that she is here for additional evaluation.  No purulent drainage, no fever or chills, no sore throat.  Past Medical History:  Diagnosis Date   Anemia    Arthritis    Barrett's esophagus    Stage B   CAD (coronary artery disease)    a. s/p STEMI 7/14 >> LHC: LM 20, mLAD 30-40, small Dx 100 (L-L collats), OM1 90, OM2 30-40, mRCA 50-60, Lat/AL akinesis, EF 35-40% >> PCI: Promus DES to OM1;  b. Myoview 3/16: diaph atten, no ischemia, EF 59% (intermediate risk b/c BP drop 152>>137, but recovery BP 162 (tech error) >> med Rx continued  c. 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 Carotid US 2/12: bilateral ICA < 50%   Hyperlipidemia    Hypertension    Ischemic cardiomyopathy    a. EF 35-40% by LHC at time of MI;  b. Echo 7/14:  Mild focal basal septal hypertrophy, EF 50-55%, apical HK, grade 1 diastolic dysfunction, trivial AI    Ischemic colitis ([place])    Orthostatic hypotension    OSA on CPAP    Osteoporosis 12/2017    T score -2.6   Raynaud disease    Rectocele    mild    Patient Active Problem List   Diagnosis Date Noted   Vertigo 07/24/2021   Pseudophakia of left eye 05/31/2021   Pseudophakia of right eye 05/31/2021   History of osteoporosis 05/27/2021   Mononucleosis 05/27/2021   Cystoid macular edema of left eye 10/11/2020   MI (myocardial infarction) ([place]) 10/09/2020   COVID-19 virus infection 05/04/2020   Type 2 macular telangiectasis, right 03/20/2020   Cystoid macular edema of right eye 03/20/2020   Type 2 macular telangiectasis, left 03/20/2020   Obstructive sleep apnea, adult 03/20/2020   Cr(e)st syndrome ([place]) 05/25/2018   Ulcer of esophagus with bleeding 02/27/2018   Psychophysiological insomnia 09/24/2017   Abnormal nuclear cardiac imaging test 05/30/2017   Abnormal stress echo    Solar lentigo 11/08/2016   Acute blood loss anemia 10/10/2015   Red blood cell antibody positive 10/04/2015   S/P coronary artery stent placement 07/12/2014   CAD (coronary artery disease), native coronary artery 06/27/2013   Hyperlipidemia 06/27/2013   Chest pain of uncertain etiology 81/82/9937   History of ST elevation myocardial infarction (STEMI) 06/24/2013   Hypertension    Great toe pain  09/11/2012   H/O foot surgery 04/09/2012   Hallux varus (acquired) 02/25/2012   GERD (gastroesophageal reflux disease)    Barrett's esophagus    Cystocele    Rectocele     Past Surgical History:  Procedure Laterality Date   ABDOMINAL HYSTERECTOMY  1996   RSO for menorrhagia.   CORONARY ANGIOPLASTY WITH STENT PLACEMENT  06/21/2013   CORONARY STENT INTERVENTION N/A 05/30/2017   Procedure: Coronary Stent Intervention;  Surgeon: [name], MD;  Location: [facility];  Service: Cardiovascular;  Laterality: N/A;   FOOT SURGERY  2013   x2   INTRAVASCULAR PRESSURE WIRE/FFR STUDY N/A 05/30/2017   Procedure: Intravascular Pressure Wire/FFR Study;  Surgeon: [name], MD;  Location: [facility];  Service: Cardiovascular;  Laterality: N/A;   JOINT REPLACEMENT  9/10   rt total   LEFT HEART CATH AND CORONARY ANGIOGRAPHY N/A 05/30/2017   Procedure: Left Heart Cath and Coronary Angiography;  Surgeon: [name], MD;  Location: [facility];  Service: Cardiovascular;  Laterality: N/A;   nissen fundoplication  1884   ROTATOR CUFF REPAIR     TONSILLECTOMY  1950   TOTAL KNEE ARTHROPLASTY     right   TUBAL LIGATION  1980    OB History     Gravida  1   Para  1   Term      Preterm      AB      Living  1      SAB      IAB      Ectopic      Multiple      Live Births               Home Medications    Prior to Admission medications   Medication Sig Start Date End Date Taking? Authorizing Provider  acetaminophen (TYLENOL) 500 MG tablet Take 1,000 mg by mouth every 6 (six) hours as needed for headache.   Yes [provider]  ALPRAZolam (XANAX) 0.25 MG tablet Take 1 tablet (0.25 mg total) by mouth daily as needed for anxiety. 09/17/17  Yes Wardell Honour, MD  aspirin EC 81 MG EC tablet Take 1 tablet (81 mg total) by mouth daily. 06/24/13  Yes Barrett, Evelene Croon, PA-C  cetirizine (ZYRTEC) 10 MG tablet Take 1 tablet (10 mg total) by mouth daily. 09/04/21  Yes Raylene Everts, MD  ezetimibe (ZETIA) 10 MG tablet TAKE 1/2 TABLET EVERY DAY 04/25/21  Yes [name], MD  famotidine (PEPCID) 40 MG tablet Take 1 tablet by mouth at bedtime. 12/21/18  Yes [provider]  fluticasone (FLONASE) 50 MCG/ACT nasal spray Place 2 sprays into both nostrils daily. 09/04/21  Yes Raylene Everts, MD  meclizine (ANTIVERT) 25 MG tablet Take 0.5-1 tablets (12.5-25 mg total) by mouth 3 (three) times daily as needed for dizziness. 07/24/21  Yes Luetta Nutting, DO  nitroGLYCERIN (NITROSTAT) 0.4 MG SL tablet Place 1 tablet (0.4 mg total) under the tongue every 5 (five) minutes x 3 doses as needed for chest pain. 09/08/20  Yes [name], MD   pantoprazole (PROTONIX) 40 MG tablet Take 40 mg by mouth daily.   Yes [provider]  pravastatin (PRAVACHOL) 20 MG tablet Take 1 tablet (20 mg total) by mouth every evening. 04/25/21  Yes [name], MD  predniSONE (DELTASONE) 20 MG tablet Take 1 tablet (20 mg total) by mouth 2 (two) times daily with a meal. 09/04/21  Yes  Raylene Everts, MD    Family History Family History  Problem Relation Age of Onset   Cancer Mother        Pancreatic Cancer   Hepatitis C Mother    Heart disease Father        congestive heart failure   Heart attack Father    Panic disorder Daughter    Hypertension Daughter    Asthma Maternal Grandmother    Emphysema Maternal Grandmother    Cancer Maternal Grandfather        lung   Stroke Neg Hx     Social History Social History   Tobacco Use   Smoking status: Never   Smokeless tobacco: Never  Vaping Use   Vaping Use: Never used  Substance Use Topics   Alcohol use: No    Alcohol/week: 0.0 standard drinks   Drug use: No     Allergies   Nsaids and Aspirin   Review of Systems Review of Systems  See HPI Physical Exam Triage Vital Signs ED Triage Vitals  Enc Vitals Group     BP 09/04/21 0826 (!) 148/69     Pulse Rate 09/04/21 0826 80     Resp 09/04/21 0826 16     Temp 09/04/21 0826 98.7 F (37.1 C)     Temp Source 09/04/21 0826 Oral     SpO2 09/04/21 0826 100 %     Weight --      Height --      Head Circumference --      Peak Flow --      Pain Score 09/04/21 0823 0     Pain Loc --      Pain Edu? --      Excl. in [place]? --    No data found.  Updated Vital Signs BP (!) 148/69 (BP Location: Left Arm)   Pulse 80   Temp 98.7 F (37.1 C) (Oral)   Resp 16   LMP 11/25/1994   SpO2 100%      Physical Exam Constitutional:      General: She is not in acute distress.    Appearance: She is well-developed.  HENT:     Head: Normocephalic and atraumatic.     Right Ear: Tympanic membrane, ear canal and external ear  normal.     Left Ear: Tympanic membrane, ear canal and external ear normal.     Nose: Congestion and rhinorrhea present.     Comments: Nasal congestion.  Pale membranes.  Clear rhinorrhea    Mouth/Throat:     Pharynx: No posterior oropharyngeal erythema.     Comments: No sinus tenderness.  Posterior pharynx benign Eyes:     Conjunctiva/sclera: Conjunctivae normal.     Pupils: Pupils are equal, round, and reactive to light.  Cardiovascular:     Rate and Rhythm: Normal rate and regular rhythm.     Heart sounds: Normal heart sounds.  Pulmonary:     Effort: Pulmonary effort is normal. No respiratory distress.     Breath sounds: Normal breath sounds. No wheezing or rales.  Abdominal:     General: There is no distension.     Palpations: Abdomen is soft.  Musculoskeletal:        General: Normal range of motion.     Cervical back: Normal range of motion.  Lymphadenopathy:     Cervical: No cervical adenopathy.  Skin:    General: Skin is warm and dry.  Neurological:     Mental Status: She is  alert.     UC Treatments / Results  Labs (all labs ordered are listed, but only abnormal results are displayed) Labs Reviewed - No data to display  EKG   Radiology No results found.  Procedures Procedures (including critical care time)  Medications Ordered in UC Medications - No data to display  Initial Impression / Assessment and Plan / UC Course  I have reviewed the triage vital signs and the nursing notes.  Pertinent labs & imaging results that were available during my care of the patient were reviewed by me and considered in my medical decision making (see chart for details).     We will treat for allergies.  See no infection that would require an antibiotic.  Follow-up with primary care Final Clinical Impressions(s) / UC Diagnoses   Final diagnoses:  Seasonal allergic rhinitis due to pollen     Discharge Instructions      Try to increase your fluid intake Take the  prednisone 2 times a day for 5 days. For the allergies in general, take cetirizine 1 pill a day during allergy season May also use Flonase once a day for nasal symptoms and runny nose and sneezing Follow-up with your primary care doctor for regular medical needs   ED Prescriptions     Medication Sig Dispense Auth. Provider   predniSONE (DELTASONE) 20 MG tablet Take 1 tablet (20 mg total) by mouth 2 (two) times daily with a meal. 10 tablet Raylene Everts, MD   cetirizine (ZYRTEC) 10 MG tablet Take 1 tablet (10 mg total) by mouth daily. 30 tablet Raylene Everts, MD   fluticasone [facility]) 50 MCG/ACT nasal spray Place 2 sprays into both nostrils daily. 16 g Raylene Everts, MD      PDMP not reviewed this encounter.   Raylene Everts, MD 09/04/21 1036

## 2021-09-12 ENCOUNTER — Ambulatory Visit: Payer: Medicare Other | Admitting: Physician Assistant

## 2021-09-12 ENCOUNTER — Encounter: Payer: Self-pay | Admitting: Physician Assistant

## 2021-09-12 ENCOUNTER — Other Ambulatory Visit: Payer: Self-pay

## 2021-09-12 VITALS — BP 120/68 | HR 104 | Ht 61.0 in | Wt 129.0 lb

## 2021-09-12 DIAGNOSIS — E785 Hyperlipidemia, unspecified: Secondary | ICD-10-CM | POA: Diagnosis not present

## 2021-09-12 DIAGNOSIS — I2510 Atherosclerotic heart disease of native coronary artery without angina pectoris: Secondary | ICD-10-CM | POA: Diagnosis not present

## 2021-09-12 DIAGNOSIS — I10 Essential (primary) hypertension: Secondary | ICD-10-CM

## 2021-09-12 DIAGNOSIS — I255 Ischemic cardiomyopathy: Secondary | ICD-10-CM

## 2021-09-12 NOTE — Patient Instructions (Signed)
Medication Instructions:  Your physician recommends that you continue on your current medications as directed. Please refer to the Current Medication list given to you today.  *If you need a refill on your cardiac medications before your next appointment, please call your pharmacy*   Lab Work: Your physician recommends that you return for a FASTING lipid profile in [place] 2023.  If you have labs (blood work) drawn today and your tests are completely normal, you will receive your results only by: [place] (if you have MyChart) OR A paper copy in the mail If you have any lab test that is abnormal or we need to change your treatment, we will call you to review the results.   Follow-Up: At [facility], you and your health needs are our priority.  As part of our continuing mission to provide you with exceptional heart care, we have created designated Provider Care Teams.  These Care Teams include your primary Cardiologist (physician) and Advanced Practice Providers (APPs -  Physician Assistants and Nurse Practitioners) who all work together to provide you with the care you need, when you need it.   Your next appointment:   6 month(s)  The format for your next appointment:   In Person  Provider:   You may see [name], MD or one of the following Advanced Practice Providers on your designated Care Team:   Richardson Dopp, PA-C Robbie Lis, [place]   Other Instructions Heart-Healthy Eating Plan Heart-healthy meal planning includes: Eating less unhealthy fats. Eating more healthy fats. Making other changes in your diet.  Cooking Avoid frying your food. Try to bake, boil, grill, or broil it instead. You can also reduce fat by: Removing the skin from poultry. Removing all visible fats from meats. Steaming vegetables in water or broth. Meal planning  At meals, divide your plate into four equal parts: Fill one-half of your plate with vegetables and green salads. Fill  one-fourth of your plate with whole grains. Fill one-fourth of your plate with lean protein foods. Eat 4-5 servings of vegetables per day. A serving of vegetables is: 1 cup of raw or cooked vegetables. 2 cups of raw leafy greens. Eat 4-5 servings of fruit per day. A serving of fruit is: 1 medium whole fruit.  cup of dried fruit.  cup of fresh, frozen, or canned fruit.  cup of 100% fruit juice. Eat more foods that have soluble fiber. These are apples, broccoli, carrots, beans, peas, and barley. Try to get 20-30 g of fiber per day. Eat 4-5 servings of nuts, legumes, and seeds per week: 1 serving of dried beans or legumes equals  cup after being cooked. 1 serving of nuts is  cup. 1 serving of seeds equals 1 tablespoon. General information Eat more home-cooked food. Eat less restaurant, buffet, and fast food. Limit or avoid alcohol. Limit foods that are high in starch and sugar. Avoid fried foods. Lose weight if you are overweight. Keep track of how much salt (sodium) you eat. This is important if you have high blood pressure. Ask your doctor to tell you more about this. Try to add vegetarian meals each week. Fats Choose healthy fats. These include olive oil and canola oil, flaxseeds, walnuts, almonds, and seeds. Eat more omega-3 fats. These include salmon, mackerel, sardines, tuna, flaxseed oil, and ground flaxseeds. Try to eat fish at least 2 times each week. Check food labels. Avoid foods with trans fats or high amounts of saturated fat. Limit saturated fats. These are often found  in animal products, such as meats, butter, and cream. These are also found in plant foods, such as palm oil, palm kernel oil, and coconut oil. Avoid foods with partially hydrogenated oils in them. These have trans fats. Examples are stick margarine, some tub margarines, cookies, crackers, and other baked goods. What foods can I eat? Fruits All fresh, canned (in natural juice), or frozen  fruits. Vegetables Fresh or frozen vegetables (raw, steamed, roasted, or grilled). Green salads. Grains Most grains. Choose whole wheat and whole grains most of the time. Rice and pasta, including brown rice and pastas made with whole wheat. Meats and other proteins Lean, well-trimmed beef, veal, pork, and lamb. Chicken and [place] without skin. All fish and shellfish. Wild duck, rabbit, pheasant, and venison. Egg whites or low-cholesterol egg substitutes. Dried beans, peas, lentils, and tofu. Seeds and most nuts. Dairy Low-fat or nonfat cheeses, including ricotta and mozzarella. Skim or 1% milk that is liquid, powdered, or evaporated. Buttermilk that is made with low-fat milk. Nonfat or low-fat yogurt. Fats and oils Non-hydrogenated (trans-free) margarines. Vegetable oils, including soybean, sesame, sunflower, olive, peanut, safflower, corn, canola, and cottonseed. Salad dressings or mayonnaise made with a vegetable oil. Beverages Mineral water. Coffee and tea. Diet carbonated beverages. Sweets and desserts Sherbet, gelatin, and fruit ice. Small amounts of dark chocolate. Limit all sweets and desserts. Seasonings and condiments All seasonings and condiments. The items listed above may not be a complete list of foods and drinks you can eat. Contact a dietitian for more options. What foods should I avoid? Fruits Canned fruit in heavy syrup. Fruit in cream or butter sauce. Fried fruit. Limit coconut. Vegetables Vegetables cooked in cheese, cream, or butter sauce. Fried vegetables. Grains Breads that are made with saturated or trans fats, oils, or whole milk. Croissants. Sweet rolls. Donuts. High-fat crackers, such as cheese crackers. Meats and other proteins Fatty meats, such as hot dogs, ribs, sausage, bacon, rib-eye roast or steak. High-fat deli meats, such as salami and bologna. Caviar. Domestic duck and goose. Organ meats, such as liver. Dairy Cream, sour cream, cream cheese, and  creamed cottage cheese. Whole-milk cheeses. Whole or 2% milk that is liquid, evaporated, or condensed. Whole buttermilk. Cream sauce or high-fat cheese sauce. Yogurt that is made from whole milk. Fats and oils Meat fat, or shortening. Cocoa butter, hydrogenated oils, palm oil, coconut oil, palm kernel oil. Solid fats and shortenings, including bacon fat, salt pork, lard, and butter. Nondairy cream substitutes. Salad dressings with cheese or sour cream. Beverages Regular sodas and juice drinks with added sugar. Sweets and desserts Frosting. Pudding. Cookies. Cakes. Pies. Milk chocolate or white chocolate. Buttered syrups. Full-fat ice cream or ice cream drinks. The items listed above may not be a complete list of foods and drinks to avoid. Contact a dietitian for more information. Summary Heart-healthy meal planning includes eating less unhealthy fats, eating more healthy fats, and making other changes in your diet. Eat a balanced diet. This includes fruits and vegetables, low-fat or nonfat dairy, lean protein, nuts and legumes, whole grains, and heart-healthy oils and fats. This information is not intended to replace advice given to you by your health care provider. Make sure you discuss any questions you have with your health care provider. Document Revised: 03/22/2021 Document Reviewed: 03/22/2021 Elsevier Patient Education  2022 [employer].

## 2021-09-14 ENCOUNTER — Other Ambulatory Visit: Payer: Self-pay

## 2021-09-14 ENCOUNTER — Emergency Department
Admission: EM | Admit: 2021-09-14 | Discharge: 2021-09-14 | Disposition: A | Discharge status: Home / Self Care | Payer: Medicare Other | Source: Home / Self Care

## 2021-09-14 ENCOUNTER — Emergency Department (INDEPENDENT_AMBULATORY_CARE_PROVIDER_SITE_OTHER): Payer: Medicare Other

## 2021-09-14 DIAGNOSIS — S82891A Other fracture of right lower leg, initial encounter for closed fracture: Secondary | ICD-10-CM | POA: Diagnosis not present

## 2021-09-14 DIAGNOSIS — M7989 Other specified soft tissue disorders: Secondary | ICD-10-CM | POA: Diagnosis not present

## 2021-09-14 DIAGNOSIS — M25571 Pain in right ankle and joints of right foot: Secondary | ICD-10-CM | POA: Diagnosis not present

## 2021-09-14 MED ORDER — TRAMADOL HCL 50 MG PO TABS: 50.0000 mg | ORAL_TABLET | Freq: Two times a day (BID) | ORAL | 0 refills | Status: DC | PRN

## 2021-09-14 NOTE — ED Triage Notes (Addendum)
Pt here today c/o RT ankle pain x 10 days. Fell at her daughters house by her pond. Bruising noted. Ice and tylenol prn. Pain 5/10

## 2021-09-14 NOTE — Discharge Instructions (Addendum)
Advised patient to wear cam walker boot 24/7 except with bathing and at sleep.  Advised patient to use pain medication (Tramadol), patient advised of sedative properties and should only been taking when she is at home.  Advised patient to follow-up with [facility] orthopedic provider above for fracture management.

## 2021-09-14 NOTE — ED Provider Notes (Signed)
[name]    CSN: [id number] Arrival date & time: 09/14/21  1422      History   Chief Complaint Chief Complaint  Patient presents with   Ankle Pain    RT    HPI Kristine [name] is a 80 y.o. female.   HPI 80 year old female presents with ankle pain x10 days.  Patient reports fell at her daughter's house by her upon, with bruising noted, currently reports pain as 5 of 10.  Past Medical History:  Diagnosis Date   Anemia    Arthritis    Barrett's esophagus    Stage B   CAD (coronary artery disease)    a. s/p STEMI 7/14 >> LHC: LM 20, mLAD 30-40, small Dx 100 (L-L collats), OM1 90, OM2 30-40, mRCA 50-60, Lat/AL akinesis, EF 35-40% >> PCI: Promus DES to OM1;  b. Myoview 3/16: diaph atten, no ischemia, EF 59% (intermediate risk b/c BP drop 152>>137, but recovery BP 162 (tech error) >> med Rx continued  c. cath 05/30/17 s/p FFR guided PCI with DES to circumflex/first OM    COVID-19 virus infection    CREST variant of scleroderma ([place])    Cystocele    Diverticulitis    Event monitor 04/2019   Event monitor 04/2019: Sinus rhythm; no atrial fibrillation/flutter; no bradycardic events; few PVCs and occasional ventricular runs up to 9 beats   GERD (gastroesophageal reflux disease)    History of blood transfusion    hx of being Anti-K positive   History of Doppler ultrasound    a. Carotid US 2/12: bilateral ICA < 50%   Hyperlipidemia    Hypertension    Ischemic cardiomyopathy    a. EF 35-40% by LHC at time of MI;  b. Echo 7/14:  Mild focal basal septal hypertrophy, EF 50-55%, apical HK, grade 1 diastolic dysfunction, trivial AI    Ischemic colitis ([place])    Orthostatic hypotension    OSA on CPAP    Osteoporosis 12/2017   T score -2.6   Raynaud disease    Rectocele    mild    Patient Active Problem List   Diagnosis Date Noted   Vertigo 07/24/2021   Pseudophakia of left eye 05/31/2021   Pseudophakia of right eye 05/31/2021   History of osteoporosis 05/27/2021    Mononucleosis 05/27/2021   Cystoid macular edema of left eye 10/11/2020   MI (myocardial infarction) ([place]) 10/09/2020   COVID-19 virus infection 05/04/2020   Type 2 macular telangiectasis, right 03/20/2020   Cystoid macular edema of right eye 03/20/2020   Type 2 macular telangiectasis, left 03/20/2020   Obstructive sleep apnea, adult 03/20/2020   Cr(e)st syndrome ([place]) 05/25/2018   Ulcer of esophagus with bleeding 02/27/2018   Psychophysiological insomnia 09/24/2017   Abnormal nuclear cardiac imaging test 05/30/2017   Abnormal stress echo    Solar lentigo 11/08/2016   Acute blood loss anemia 10/10/2015   Red blood cell antibody positive 10/04/2015   S/P coronary artery stent placement 07/12/2014   CAD (coronary artery disease), native coronary artery 06/27/2013   Hyperlipidemia 06/27/2013   Chest pain of uncertain etiology 25/95/6387   History of ST elevation myocardial infarction (STEMI) 06/24/2013   Hypertension    Great toe pain 09/11/2012   H/O foot surgery 04/09/2012   Hallux varus (acquired) 02/25/2012   GERD (gastroesophageal reflux disease)    Barrett's esophagus    Cystocele    Rectocele     Past Surgical History:  Procedure Laterality Date   ABDOMINAL  HYSTERECTOMY  1996   RSO for menorrhagia.   CORONARY ANGIOPLASTY WITH STENT PLACEMENT  06/21/2013   CORONARY STENT INTERVENTION N/A 05/30/2017   Procedure: Coronary Stent Intervention;  Surgeon: [name], MD;  Location: [facility];  Service: Cardiovascular;  Laterality: N/A;   FOOT SURGERY  2013   x2   INTRAVASCULAR PRESSURE WIRE/FFR STUDY N/A 05/30/2017   Procedure: Intravascular Pressure Wire/FFR Study;  Surgeon: [name], MD;  Location: [facility];  Service: Cardiovascular;  Laterality: N/A;   JOINT REPLACEMENT  9/10   rt total   LEFT HEART CATH AND CORONARY ANGIOGRAPHY N/A 05/30/2017   Procedure: Left Heart Cath and Coronary Angiography;  Surgeon: [name], MD;  Location: [facility];  Service: Cardiovascular;  Laterality: N/A;   nissen fundoplication  4580   ROTATOR CUFF REPAIR     TONSILLECTOMY  1950   TOTAL KNEE ARTHROPLASTY     right   TUBAL LIGATION  1980    OB History     Gravida  1   Para  1   Term      Preterm      AB      Living  1      SAB      IAB      Ectopic      Multiple      Live Births               Home Medications    Prior to Admission medications   Medication Sig Start Date End Date Taking? Authorizing Provider  traMADol (ULTRAM) 50 MG tablet Take 1 tablet (50 mg total) by mouth 2 (two) times daily as needed. 09/14/21  Yes Eliezer Lofts, FNP  acetaminophen (TYLENOL) 500 MG tablet Take 1,000 mg by mouth every 6 (six) hours as needed for headache.    [provider]  ALPRAZolam Duanne Moron) 0.25 MG tablet Take 1 tablet (0.25 mg total) by mouth daily as needed for anxiety. 09/17/17   Wardell Honour, MD  aspirin EC 81 MG EC tablet Take 1 tablet (81 mg total) by mouth daily. 06/24/13   Barrett, Evelene Croon, PA-C  cetirizine (ZYRTEC) 10 MG tablet Take 1 tablet (10 mg total) by mouth daily. 09/04/21   Raylene Everts, MD  ezetimibe (ZETIA) 10 MG tablet TAKE 1/2 TABLET EVERY DAY 04/25/21   [name], MD  famotidine (PEPCID) 40 MG tablet Take 1 tablet by mouth at bedtime. 12/21/18   [provider]  fluticasone (FLONASE) 50 MCG/ACT nasal spray Place 2 sprays into both nostrils daily. 09/04/21   Raylene Everts, MD  meclizine (ANTIVERT) 25 MG tablet Take 0.5-1 tablets (12.5-25 mg total) by mouth 3 (three) times daily as needed for dizziness. 07/24/21   Luetta Nutting, DO  nitroGLYCERIN (NITROSTAT) 0.4 MG SL tablet Place 1 tablet (0.4 mg total) under the tongue every 5 (five) minutes x 3 doses as needed for chest pain. 09/08/20   [name], MD  pantoprazole (PROTONIX) 40 MG tablet Take 40 mg by mouth daily.    [provider]  pravastatin (PRAVACHOL) 20 MG tablet Take 1 tablet (20 mg  total) by mouth every evening. 04/25/21   [name], MD    Family History Family History  Problem Relation Age of Onset   Cancer Mother        Pancreatic Cancer   Hepatitis C Mother    Heart disease Father  congestive heart failure   Heart attack Father    Panic disorder Daughter    Hypertension Daughter    Asthma Maternal Grandmother    Emphysema Maternal Grandmother    Cancer Maternal Grandfather        lung   Stroke Neg Hx     Social History Social History   Tobacco Use   Smoking status: Never   Smokeless tobacco: Never  Vaping Use   Vaping Use: Never used  Substance Use Topics   Alcohol use: No    Alcohol/week: 0.0 standard drinks   Drug use: No     Allergies   Nsaids and Aspirin   Review of Systems Review of Systems  Musculoskeletal:        Right ankle pain x10 days.  All other systems reviewed and are negative.   Physical Exam Triage Vital Signs ED Triage Vitals  Enc Vitals Group     BP 09/14/21 1441 (!) 154/87     Pulse Rate 09/14/21 1441 (!) 111     Resp 09/14/21 1441 17     Temp 09/14/21 1441 98.5 F (36.9 C)     Temp Source 09/14/21 1441 Oral     SpO2 09/14/21 1441 100 %     Weight --      Height --      Head Circumference --      Peak Flow --      Pain Score 09/14/21 1443 5     Pain Loc --      Pain Edu? --      Excl. in [place]? --    No data found.  Updated Vital Signs BP (!) 154/87 (BP Location: Right Arm)   Pulse (!) 111   Temp 98.5 F (36.9 C) (Oral)   Resp 17   LMP 11/25/1994   SpO2 100%    Physical Exam Constitutional:      General: She is not in acute distress.    Appearance: Normal appearance. She is normal weight. She is not ill-appearing.  HENT:     Head: Normocephalic and atraumatic.     Mouth/Throat:     Mouth: Mucous membranes are moist.     Pharynx: Oropharynx is clear.  Eyes:     Extraocular Movements: Extraocular movements intact.     Conjunctiva/sclera: Conjunctivae normal.     Pupils: Pupils  are equal, round, and reactive to light.  Cardiovascular:     Rate and Rhythm: Normal rate and regular rhythm.     Pulses: Normal pulses.     Heart sounds: Normal heart sounds.  Pulmonary:     Effort: Pulmonary effort is normal.     Breath sounds: Normal breath sounds.  Musculoskeletal:        General: Normal range of motion.     Cervical back: Normal range of motion and neck supple.     Comments: Right ankle: LROM with flexion/extension, TTP over lateral malleolus, exam limited due to pain today  Skin:    General: Skin is warm and dry.  Neurological:     General: No focal deficit present.     Mental Status: She is alert and oriented to person, place, and time.     UC Treatments / Results  Labs (all labs ordered are listed, but only abnormal results are displayed) Labs Reviewed - No data to display  EKG   Radiology DG Ankle Complete Right  Result Date: 09/14/2021 CLINICAL DATA:  Injury EXAM: RIGHT ANKLE - COMPLETE 3+ VIEW  COMPARISON:  None. FINDINGS: Acute fracture of the distal right fibula without significant displacement. Soft tissue swelling is present at the lateral malleolus. Joint spaces are preserved. IMPRESSION: Acute fracture lateral malleolus without significant displacement. Electronically Signed   By: Macy Mis M.D.   On: 09/14/2021 15:16    Procedures Procedures (including critical care time)  Medications Ordered in UC Medications - No data to display  Initial Impression / Assessment and Plan / UC Course  I have reviewed the triage vital signs and the nursing notes.  Pertinent labs & imaging results that were available during my care of the patient were reviewed by me and considered in my medical decision making (see chart for details).     MDM: 1.  Closed fracture of right ankle, initial encounter-x-ray of right ankle revealed acute fracture of lateral malleolus without significant displacement, Rx'd Tramadol. Advised patient to wear cam walker boot  24/7 except with bathing and at sleep.  Advised patient to use pain medication (Tramadol), patient advised of sedative properties and should only been taking when she is at home.  Advised patient to follow-up with [facility] orthopedic provider above for fracture management.  Patient reports that she has orthopedic follow-up scheduled for Monday with her orthopedic provider.  Patient discharged home, hemodynamically stable. Final Clinical Impressions(s) / UC Diagnoses   Final diagnoses:  Closed fracture of right ankle, initial encounter     Discharge Instructions      Advised patient to wear cam walker boot 24/7 except with bathing and at sleep.  Advised patient to use pain medication (Tramadol), patient advised of sedative properties and should only been taking when she is at home.  Advised patient to follow-up with [facility] orthopedic provider above for fracture management.     ED Prescriptions     Medication Sig Dispense Auth. Provider   traMADol (ULTRAM) 50 MG tablet Take 1 tablet (50 mg total) by mouth 2 (two) times daily as needed. 20 tablet Eliezer Lofts, FNP      I have reviewed the PDMP during this encounter.   Eliezer Lofts, [place] 09/14/21 1557

## 2021-09-17 DIAGNOSIS — M25571 Pain in right ankle and joints of right foot: Secondary | ICD-10-CM | POA: Diagnosis not present

## 2021-10-04 DIAGNOSIS — M25571 Pain in right ankle and joints of right foot: Secondary | ICD-10-CM | POA: Diagnosis not present

## 2021-10-12 DIAGNOSIS — G4733 Obstructive sleep apnea (adult) (pediatric): Secondary | ICD-10-CM | POA: Diagnosis not present

## 2021-11-07 ENCOUNTER — Other Ambulatory Visit: Payer: Self-pay

## 2021-11-07 DIAGNOSIS — R413 Other amnesia: Secondary | ICD-10-CM

## 2021-11-07 NOTE — Progress Notes (Signed)
Referral signed. ___________________________________________ Jacquez Sheetz L. Arlina Sabina, DNP, APRN, FNP-BC Primary Care and Sports Medicine [facility]  

## 2021-11-07 NOTE — Progress Notes (Signed)
Pt called requesting referral to Neurology for memory changes. She would like to receive testing.

## 2021-11-14 ENCOUNTER — Telehealth: Payer: Self-pay

## 2021-11-14 ENCOUNTER — Other Ambulatory Visit: Payer: Self-pay | Admitting: Family Medicine

## 2021-11-14 MED ORDER — CEPHALEXIN 500 MG PO CAPS: 500.0000 mg | ORAL_CAPSULE | Freq: Three times a day (TID) | ORAL | 0 refills | Status: AC
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
## 2021-12-03 ENCOUNTER — Encounter (INDEPENDENT_AMBULATORY_CARE_PROVIDER_SITE_OTHER): Payer: Medicare Other | Admitting: Ophthalmology

## 2021-12-07 ENCOUNTER — Telehealth: Payer: Self-pay | Admitting: Cardiovascular Disease

## 2021-12-07 NOTE — Telephone Encounter (Signed)
Pt c/o medication issue:  1. Name of Medication: pravastatin (PRAVACHOL) 20 MG tablet  2. How are you currently taking this medication (dosage and times per day)? Take 1 tablet (20 mg total) by mouth every evening.  3. Are you having a reaction (difficulty breathing--STAT)? no  4. What is your medication issue? Patient calling to see what the medication is for and if she should still be using it

## 2021-12-07 NOTE — Telephone Encounter (Signed)
Pt advised that we have her taking Pravachol 20 mg daily and she was not sure since [facility] sent her an RX that she did not request but she has been taking every night at bedtime without difficulties.

## 2021-12-24 ENCOUNTER — Encounter (INDEPENDENT_AMBULATORY_CARE_PROVIDER_SITE_OTHER): Payer: Self-pay | Admitting: Ophthalmology

## 2021-12-24 ENCOUNTER — Ambulatory Visit (INDEPENDENT_AMBULATORY_CARE_PROVIDER_SITE_OTHER): Payer: Medicare Other | Admitting: Ophthalmology

## 2021-12-24 ENCOUNTER — Other Ambulatory Visit: Payer: Self-pay

## 2021-12-24 DIAGNOSIS — H35071 Retinal telangiectasis, right eye: Secondary | ICD-10-CM | POA: Diagnosis not present

## 2021-12-24 DIAGNOSIS — G4733 Obstructive sleep apnea (adult) (pediatric): Secondary | ICD-10-CM | POA: Diagnosis not present

## 2021-12-24 DIAGNOSIS — H35072 Retinal telangiectasis, left eye: Secondary | ICD-10-CM

## 2021-12-24 NOTE — Assessment & Plan Note (Addendum)
Patient continues to use CPAP with excellent compliance

## 2021-12-24 NOTE — Assessment & Plan Note (Signed)
No change in inner perifoveal cystoid appearance, likely chronic and stabilized on CPAP

## 2021-12-24 NOTE — Assessment & Plan Note (Signed)
No signs of active disease on her CT

## 2021-12-24 NOTE — Progress Notes (Signed)
12/24/2021     CHIEF COMPLAINT Patient presents for  Chief Complaint  Patient presents with   Retina Follow Up      HISTORY OF PRESENT ILLNESS: Kristine [name] is a 81 y.o. female who presents to the clinic today for:   HPI     Retina Follow Up           Diagnosis: Other   Laterality: right eye   Onset: 6 months ago   Severity: mild   Duration: 6 months   Course: stable         Comments   6 mos fu ou oct. Patient states vision is stable and unchanged since last visit. Denies any new floaters or FOL.       Last edited by Laurin Coder on 12/24/2021  3:31 PM.      Referring physician: Luetta Nutting, DO [address]  [address] [place],  [place] [address]  HISTORICAL INFORMATION:   Selected notes from the MEDICAL RECORD NUMBER       CURRENT MEDICATIONS: No current outpatient medications on file. (Ophthalmic Drugs)   No current facility-administered medications for this visit. (Ophthalmic Drugs)   Current Outpatient Medications (Other)  Medication Sig   acetaminophen (TYLENOL) 500 MG tablet Take 1,000 mg by mouth every 6 (six) hours as needed for headache.   ALPRAZolam (XANAX) 0.25 MG tablet Take 1 tablet (0.25 mg total) by mouth daily as needed for anxiety.   aspirin EC 81 MG EC tablet Take 1 tablet (81 mg total) by mouth daily.   cetirizine (ZYRTEC) 10 MG tablet Take 1 tablet (10 mg total) by mouth daily.   ezetimibe (ZETIA) 10 MG tablet TAKE 1/2 TABLET EVERY DAY   famotidine (PEPCID) 40 MG tablet Take 1 tablet by mouth at bedtime.   fluticasone (FLONASE) 50 MCG/ACT nasal spray Place 2 sprays into both nostrils daily.   meclizine (ANTIVERT) 25 MG tablet Take 0.5-1 tablets (12.5-25 mg total) by mouth 3 (three) times daily as needed for dizziness.   nitroGLYCERIN (NITROSTAT) 0.4 MG SL tablet Place 1 tablet (0.4 mg total) under the tongue every 5 (five) minutes x 3 doses as needed for chest pain.   pantoprazole (PROTONIX) 40 MG tablet Take 40 mg  by mouth daily.   pravastatin (PRAVACHOL) 20 MG tablet Take 1 tablet (20 mg total) by mouth every evening.   traMADol (ULTRAM) 50 MG tablet Take 1 tablet (50 mg total) by mouth 2 (two) times daily as needed.   No current facility-administered medications for this visit. (Other)      REVIEW OF SYSTEMS:    ALLERGIES Allergies  Allergen Reactions   Nsaids Other (See Comments)    Pt is prone to bleeding   Aspirin Other (See Comments)    Other reaction(s): Other Can take 81 mg but not aspirin based medications Pt is prone to bleeding, ok with low dose coated aspirin    PAST MEDICAL HISTORY Past Medical History:  Diagnosis Date   Anemia    Arthritis    Barrett's esophagus    Stage B   CAD (coronary artery disease)    a. s/p STEMI 7/14 >> LHC: LM 20, mLAD 30-40, small Dx 100 (L-L collats), OM1 90, OM2 30-40, mRCA 50-60, Lat/AL akinesis, EF 35-40% >> PCI: Promus DES to OM1;  b. Myoview 3/16: diaph atten, no ischemia, EF 59% (intermediate risk b/c BP drop 152>>137, but recovery BP 162 (tech error) >> med Rx continued  c. 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 Carotid US 2/12: bilateral ICA < 50%   Hyperlipidemia    Hypertension    Ischemic cardiomyopathy    a. EF 35-40% by LHC at time of MI;  b. Echo 7/14:  Mild focal basal septal hypertrophy, EF 50-55%, apical HK, grade 1 diastolic dysfunction, trivial AI    Ischemic colitis ([place])    Orthostatic hypotension    OSA on CPAP    Osteoporosis 12/2017   T score -2.6   Raynaud disease    Rectocele     mild   Past Surgical History:  Procedure Laterality Date   ABDOMINAL HYSTERECTOMY  1996   RSO for menorrhagia.   CORONARY ANGIOPLASTY WITH STENT PLACEMENT  06/21/2013   CORONARY STENT INTERVENTION N/A 05/30/2017   Procedure: Coronary Stent Intervention;  Surgeon: [name], MD;  Location: [facility];  Service: Cardiovascular;  Laterality: N/A;   FOOT SURGERY  2013   x2   INTRAVASCULAR PRESSURE WIRE/FFR STUDY N/A 05/30/2017   Procedure: Intravascular Pressure Wire/FFR Study;  Surgeon: [name], MD;  Location: [place];  Service: Cardiovascular;  Laterality: N/A;   JOINT REPLACEMENT  9/10   rt total   LEFT HEART CATH AND CORONARY ANGIOGRAPHY N/A 05/30/2017   Procedure: Left Heart Cath and Coronary Angiography;  Surgeon: [name], MD;  Location: [place] [facility];  Service: Cardiovascular;  Laterality: N/A;   nissen fundoplication  2330   ROTATOR CUFF REPAIR     TONSILLECTOMY  1950   TOTAL KNEE ARTHROPLASTY     right   TUBAL LIGATION  1980    FAMILY HISTORY Family History  Problem Relation Age of Onset   Cancer Mother        Pancreatic Cancer   Hepatitis C Mother    Heart disease Father        congestive heart failure   Heart attack Father    Panic disorder Daughter    Hypertension Daughter    Asthma Maternal Grandmother    Emphysema Maternal Grandmother    Cancer Maternal Grandfather        lung   Stroke Neg Hx     SOCIAL HISTORY Social History   Tobacco Use   Smoking status: Never   Smokeless tobacco: Never  Vaping Use   Vaping Use: Never used  Substance Use Topics   Alcohol use: No    Alcohol/week: 0.0 standard drinks   Drug use: No         OPHTHALMIC EXAM:  Base Eye Exam     Visual Acuity (ETDRS)       Right Left   Dist [place] 20/20 -1 20/20 -1         Tonometry (Tonopen, 3:33 PM)       Right Left   Pressure 15 20         Pupils       Pupils Dark Light Shape React APD   Right PERRL 4 3 Round Brisk  None    Left PERRL 4 3 Round Brisk None         Visual Fields (Counting fingers)       Left Right    Full Full         Extraocular Movement       Right Left    Full Full         Neuro/Psych     Oriented x3: Yes   Mood/Affect: Normal         Dilation     Both eyes: 1.0% Mydriacyl, 2.5% Phenylephrine @ 3:33 PM           Slit Lamp and Fundus Exam     External Exam       Right Left   External Normal Normal         Slit Lamp Exam       Right Left   Lids/Lashes Normal Normal   Conjunctiva/Sclera White and quiet White and quiet   Cornea Clear Clear   Anterior Chamber Deep and quiet Deep and quiet   Iris Round and reactive Round and reactive   Lens Posterior chamber intraocular lens Posterior chamber intraocular lens   Anterior Vitreous Normal Normal         Fundus Exam       Right Left   Posterior Vitreous Normal Normal   Disc Normal Normal   C/D Ratio 0.3 0.55   Macula no macular thickening no macular thickening, Microaneurysms   Vessels Normal Normal   Periphery Normal Normal            IMAGING AND PROCEDURES  Imaging and Procedures for 12/24/21  OCT, Retina - OU - Both Eyes       Right Eye Quality was good. Scan locations included subfoveal. Central Foveal Thickness: 288. Progression has been stable. Findings include cystoid macular edema.   Left Eye Quality was good. Scan locations included subfoveal. Central Foveal Thickness: 284. Progression has been stable.   Notes Mild perifoveal CME slightly improved only  56months status post onset use of CPAP, yet recent poor CPAP mask fit has improved over the last 6 months  Persistent perifoveal schisis type residual from previous active macular retinal schisis.                  ASSESSMENT/PLAN:  Obstructive sleep apnea, adult Patient continues to use CPAP with excellent compliance    Type 2 macular telangiectasis, left No signs of active disease on her CT  Type 2  macular telangiectasis, right No change in inner perifoveal cystoid appearance, likely chronic and stabilized on CPAP     ICD-10-CM   1. Type 2 macular telangiectasis, right  H35.071 OCT, Retina - OU - Both Eyes    2. Obstructive sleep apnea, adult  G47.33     3. Type 2 macular telangiectasis, left  H35.072       1.  OU stabilized on his chronic CPAP use continue  2.  3.  Ophthalmic Meds Ordered this visit:  No orders of the defined types were placed in this encounter.      Return in about 6 months (around 06/23/2022) for DILATE OU, OCT.  There are no Patient Instructions on file for this visit.   Explained the diagnoses, plan, and follow up with the patient and they expressed understanding.  Patient expressed understanding of the importance of proper follow up care.   Clent Demark Aquita Simmering M.D. Diseases & Surgery of the Retina and Vitreous [facility] 12/24/21     Abbreviations: M myopia (nearsighted); A astigmatism; H hyperopia (farsighted); P presbyopia; Mrx spectacle prescription;  CTL contact lenses; OD right eye; OS left eye; OU both eyes  XT exotropia; ET esotropia; PEK punctate epithelial keratitis; PEE punctate epithelial erosions; DES dry eye syndrome; MGD meibomian gland dysfunction; ATs artificial tears; PFAT's preservative free artificial tears; [place] nuclear sclerotic cataract; PSC posterior subcapsular cataract; ERM epi-retinal membrane; PVD posterior vitreous detachment; RD retinal detachment; DM diabetes mellitus; DR diabetic retinopathy; NPDR non-proliferative diabetic retinopathy; PDR proliferative diabetic retinopathy; CSME clinically significant macular edema; DME diabetic macular edema; dbh dot blot hemorrhages; CWS cotton wool spot; POAG primary open angle glaucoma; C/D cup-to-disc ratio; HVF humphrey visual field; GVF goldmann visual field; OCT optical coherence tomography; IOP intraocular pressure; BRVO Branch retinal vein occlusion; CRVO  central retinal vein occlusion; CRAO central retinal artery occlusion; BRAO branch retinal artery occlusion; RT retinal tear; SB scleral buckle; PPV pars plana vitrectomy; VH Vitreous hemorrhage; PRP panretinal laser photocoagulation; IVK intravitreal kenalog; VMT vitreomacular traction; MH Macular hole;  NVD neovascularization of the disc; NVE neovascularization elsewhere; AREDS age related eye disease study; ARMD age related macular degeneration; POAG primary open angle glaucoma; EBMD epithelial/anterior basement membrane dystrophy; ACIOL anterior chamber intraocular lens; IOL intraocular lens; PCIOL posterior chamber intraocular lens; Phaco/IOL phacoemulsification with intraocular lens placement; [place] photorefractive keratectomy; LASIK laser assisted in situ keratomileusis; HTN hypertension; DM diabetes mellitus; COPD chronic obstructive pulmonary disease

## 2022-02-11 ENCOUNTER — Encounter (INDEPENDENT_AMBULATORY_CARE_PROVIDER_SITE_OTHER): Payer: Self-pay | Admitting: Ophthalmology

## 2022-02-11 ENCOUNTER — Ambulatory Visit (INDEPENDENT_AMBULATORY_CARE_PROVIDER_SITE_OTHER): Payer: Medicare Other | Admitting: Ophthalmology

## 2022-02-11 ENCOUNTER — Other Ambulatory Visit: Payer: Self-pay

## 2022-02-11 DIAGNOSIS — H35072 Retinal telangiectasis, left eye: Secondary | ICD-10-CM

## 2022-02-11 DIAGNOSIS — H35071 Retinal telangiectasis, right eye: Secondary | ICD-10-CM

## 2022-02-11 DIAGNOSIS — G4733 Obstructive sleep apnea (adult) (pediatric): Secondary | ICD-10-CM

## 2022-02-11 DIAGNOSIS — Z9989 Dependence on other enabling machines and devices: Secondary | ICD-10-CM

## 2022-02-11 NOTE — Assessment & Plan Note (Signed)
Condition stable on CPAP ?

## 2022-02-11 NOTE — Assessment & Plan Note (Signed)
Has been quite compliant but not recently because of a skin biopsy done where the mask fits waiting for this to heal ?

## 2022-02-11 NOTE — Progress Notes (Signed)
02/11/2022     CHIEF COMPLAINT Patient presents for  Chief Complaint  Patient presents with   Retina Evaluation      HISTORY OF PRESENT ILLNESS: Kristine [name] is a 81 y.o. female who presents to the clinic today for:   HPI     Retina Evaluation           Laterality: both eyes   MD Performed: performed the HPI with the patient and updated documentation appropriately         Comments   New onset Vision changes in both eyes.  The vision changes are described only when she is driving and seeing certain forms and shapes of lights such as red lights that are distorted in appearance and otherwise no other symptoms      Last edited by Edmon Crape, MD on 02/11/2022  2:11 PM.      Referring physician: Everrett Coombe, DO [address]  [address] [place],  [place] [address]  HISTORICAL INFORMATION:   Selected notes from the MEDICAL RECORD NUMBER       CURRENT MEDICATIONS: No current outpatient medications on file. (Ophthalmic Drugs)   No current facility-administered medications for this visit. (Ophthalmic Drugs)   Current Outpatient Medications (Other)  Medication Sig   acetaminophen (TYLENOL) 500 MG tablet Take 1,000 mg by mouth every 6 (six) hours as needed for headache.   ALPRAZolam (XANAX) 0.25 MG tablet Take 1 tablet (0.25 mg total) by mouth daily as needed for anxiety.   aspirin EC 81 MG EC tablet Take 1 tablet (81 mg total) by mouth daily.   cetirizine (ZYRTEC) 10 MG tablet Take 1 tablet (10 mg total) by mouth daily.   ezetimibe (ZETIA) 10 MG tablet TAKE 1/2 TABLET EVERY DAY   famotidine (PEPCID) 40 MG tablet Take 1 tablet by mouth at bedtime.   fluticasone (FLONASE) 50 MCG/ACT nasal spray Place 2 sprays into both nostrils daily.   meclizine (ANTIVERT) 25 MG tablet Take 0.5-1 tablets (12.5-25 mg total) by mouth 3 (three) times daily as needed for dizziness.   nitroGLYCERIN (NITROSTAT) 0.4 MG SL tablet Place 1 tablet (0.4 mg total) under the tongue  every 5 (five) minutes x 3 doses as needed for chest pain.   pantoprazole (PROTONIX) 40 MG tablet Take 40 mg by mouth daily.   pravastatin (PRAVACHOL) 20 MG tablet Take 1 tablet (20 mg total) by mouth every evening.   traMADol (ULTRAM) 50 MG tablet Take 1 tablet (50 mg total) by mouth 2 (two) times daily as needed.   No current facility-administered medications for this visit. (Other)      REVIEW OF SYSTEMS: ROS   Negative for: Constitutional, Gastrointestinal, Neurological, Skin, Genitourinary, Musculoskeletal, HENT, Endocrine, Cardiovascular, Eyes, Respiratory, Psychiatric, Allergic/Imm, Heme/Lymph Last edited by Edmon Crape, MD on 02/11/2022  2:05 PM.       ALLERGIES Allergies  Allergen Reactions   Nsaids Other (See Comments)    Pt is prone to bleeding   Aspirin Other (See Comments)    Other reaction(s): Other Can take 81 mg but not aspirin based medications Pt is prone to bleeding, ok with low dose coated aspirin    PAST MEDICAL HISTORY Past Medical History:  Diagnosis Date   Anemia    Arthritis    Barrett's esophagus    Stage B   CAD (coronary artery disease)    a. s/p STEMI 7/14 >> LHC: LM 20, mLAD 30-40, small Dx 100 (L-L collats), OM1 90, OM2  30-40, mRCA 50-60, Lat/AL akinesis, EF 35-40% >> PCI: Promus DES to OM1;  b. Myoview 3/16: diaph atten, no ischemia, EF 59% (intermediate risk b/c BP drop 152>>137, but recovery BP 162 (tech error) >> med Rx continued  c. cath 05/30/17 s/p FFR guided PCI with DES to circumflex/first OM    COVID-19 virus infection    CREST variant of scleroderma (HCC)    Cystocele    Diverticulitis    Event monitor 04/2019   Event monitor 04/2019: Sinus rhythm; no atrial fibrillation/flutter; no bradycardic events; few PVCs and occasional ventricular runs up to 9 beats   GERD (gastroesophageal reflux disease)    History of blood transfusion    hx of being Anti-K positive   History of Doppler ultrasound    a. Carotid US 2/12: bilateral ICA <  50%   Hyperlipidemia    Hypertension    Ischemic cardiomyopathy    a. EF 35-40% by LHC at time of MI;  b. Echo 7/14:  Mild focal basal septal hypertrophy, EF 50-55%, apical HK, grade 1 diastolic dysfunction, trivial AI    Ischemic colitis (HCC)    Orthostatic hypotension    OSA on CPAP    Osteoporosis 12/2017   T score -2.6   Raynaud disease    Rectocele    mild   Past Surgical History:  Procedure Laterality Date   ABDOMINAL HYSTERECTOMY  1996   RSO for menorrhagia.   CORONARY ANGIOPLASTY WITH STENT PLACEMENT  06/21/2013   CORONARY STENT INTERVENTION N/A 05/30/2017   Procedure: Coronary Stent Intervention;  Surgeon: Tonny Bollman, MD;  Location: [facility] INVASIVE CV LAB;  Service: Cardiovascular;  Laterality: N/A;   FOOT SURGERY  2013   x2   INTRAVASCULAR PRESSURE WIRE/FFR STUDY N/A 05/30/2017   Procedure: Intravascular Pressure Wire/FFR Study;  Surgeon: Tonny Bollman, MD;  Location: [facility] INVASIVE CV LAB;  Service: Cardiovascular;  Laterality: N/A;   JOINT REPLACEMENT  9/10   rt total   LEFT HEART CATH AND CORONARY ANGIOGRAPHY N/A 05/30/2017   Procedure: Left Heart Cath and Coronary Angiography;  Surgeon: Tonny Bollman, MD;  Location: [facility] INVASIVE CV LAB;  Service: Cardiovascular;  Laterality: N/A;   nissen fundoplication  2006   ROTATOR CUFF REPAIR     TONSILLECTOMY  1950   TOTAL KNEE ARTHROPLASTY     right   TUBAL LIGATION  1980    FAMILY HISTORY Family History  Problem Relation Age of Onset   Cancer Mother        Pancreatic Cancer   Hepatitis C Mother    Heart disease Father        congestive heart failure   Heart attack Father    Panic disorder Daughter    Hypertension Daughter    Asthma Maternal Grandmother    Emphysema Maternal Grandmother    Cancer Maternal Grandfather        lung   Stroke Neg Hx     SOCIAL HISTORY Social History   Tobacco Use   Smoking status: Never   Smokeless tobacco: Never  Vaping Use   Vaping Use: Never used  Substance Use Topics    Alcohol use: No    Alcohol/week: 0.0 standard drinks   Drug use: No         OPHTHALMIC EXAM:  Base Eye Exam     Visual Acuity (ETDRS)       Right Left   Dist [place] 20/20 -1 20/20 -1         Tonometry (Tonopen, 2:09  PM)       Right Left   Pressure 17 19         Pupils       Pupils APD   Right PERRL None   Left PERRL          Extraocular Movement       Right Left    Full, Ortho Full, Ortho         Neuro/Psych     Oriented x3: Yes   Mood/Affect: Normal         Dilation     Both eyes: 1.0% Mydriacyl, 2.5% Phenylephrine @ 2:06 PM           Slit Lamp and Fundus Exam     External Exam       Right Left   External Normal Normal         Slit Lamp Exam       Right Left   Lids/Lashes Normal Normal   Conjunctiva/Sclera White and quiet White and quiet   Cornea Clear Clear   Anterior Chamber Deep and quiet Deep and quiet   Iris Round and reactive Round and reactive   Lens Posterior chamber intraocular lens Posterior chamber intraocular lens   Anterior Vitreous Normal Normal         Fundus Exam       Right Left   Posterior Vitreous Normal Normal   Disc Normal Normal   C/D Ratio 0.3 0.55   Macula no macular thickening no macular thickening, Microaneurysms   Vessels Normal Normal   Periphery Normal Normal            IMAGING AND PROCEDURES  Imaging and Procedures for 02/11/22  OCT, Retina - OU - Both Eyes       Right Eye Quality was good. Scan locations included subfoveal. Central Foveal Thickness: 291. Progression has been stable. Findings include cystoid macular edema.   Left Eye Quality was good. Scan locations included subfoveal. Central Foveal Thickness: 285. Progression has been stable.   Notes Mild perifoveal CME slightly improved only  16 months status post onset use of CPAP, yet recent poor CPAP mask fit has improved over the last 6 months  Persistent perifoveal schisis type residual from previous active macular  retinal schisis.                  ASSESSMENT/PLAN:  OSA on CPAP Has been quite compliant but not recently because of a skin biopsy done where the mask fits waiting for this to heal  Type 2 macular telangiectasis, right Condition OD stable now the patient on CPAP  Type 2 macular telangiectasis, left Condition stable on CPAP     ICD-10-CM   1. Type 2 macular telangiectasis, right  H35.071 OCT, Retina - OU - Both Eyes    2. OSA on CPAP  G47.33    Z99.89     3. Type 2 macular telangiectasis, left  H35.072 OCT, Retina - OU - Both Eyes      1.  OU, no active maculopathy, this MAC-TEL control while patient to prevent nightly hypoxic and hypertensive damage from untreated sleep apnea by using her CPAP nightly  2.  Patient corrected in her assuming that she may very well have untreated refractive error with astigmatism and thus she needs a refraction with her optometrist or general eye doctor of choice,  3.  Ophthalmic Meds Ordered this visit:  No orders of the defined types were placed in this encounter.  No follow-ups on file.  There are no Patient Instructions on file for this visit.   Explained the diagnoses, plan, and follow up with the patient and they expressed understanding.  Patient expressed understanding of the importance of proper follow up care.   Alford Highland Quantavis Obryant M.D. Diseases & Surgery of the Retina and Vitreous [facility] 02/11/22     Abbreviations: M myopia (nearsighted); A astigmatism; H hyperopia (farsighted); P presbyopia; Mrx spectacle prescription;  CTL contact lenses; OD right eye; OS left eye; OU both eyes  XT exotropia; ET esotropia; PEK punctate epithelial keratitis; PEE punctate epithelial erosions; DES dry eye syndrome; MGD meibomian gland dysfunction; ATs artificial tears; PFAT's preservative free artificial tears; NSC nuclear sclerotic cataract; PSC posterior subcapsular cataract; ERM epi-retinal membrane; PVD  posterior vitreous detachment; RD retinal detachment; DM diabetes mellitus; DR diabetic retinopathy; NPDR non-proliferative diabetic retinopathy; PDR proliferative diabetic retinopathy; CSME clinically significant macular edema; DME diabetic macular edema; dbh dot blot hemorrhages; CWS cotton wool spot; POAG primary open angle glaucoma; C/D cup-to-disc ratio; HVF humphrey visual field; GVF goldmann visual field; OCT optical coherence tomography; IOP intraocular pressure; BRVO Branch retinal vein occlusion; CRVO central retinal vein occlusion; CRAO central retinal artery occlusion; BRAO branch retinal artery occlusion; RT retinal tear; SB scleral buckle; PPV pars plana vitrectomy; VH Vitreous hemorrhage; PRP panretinal laser photocoagulation; IVK intravitreal kenalog; VMT vitreomacular traction; MH Macular hole;  NVD neovascularization of the disc; NVE neovascularization elsewhere; AREDS age related eye disease study; ARMD age related macular degeneration; POAG primary open angle glaucoma; EBMD epithelial/anterior basement membrane dystrophy; ACIOL anterior chamber intraocular lens; IOL intraocular lens; PCIOL posterior chamber intraocular lens; Phaco/IOL phacoemulsification with intraocular lens placement; PRK photorefractive keratectomy; LASIK laser assisted in situ keratomileusis; HTN hypertension; DM diabetes mellitus; COPD chronic obstructive pulmonary disease

## 2022-02-11 NOTE — Assessment & Plan Note (Signed)
Condition OD stable now the patient on CPAP ?

## 2022-03-07 ENCOUNTER — Other Ambulatory Visit: Payer: Self-pay | Admitting: Cardiovascular Disease

## 2022-03-13 ENCOUNTER — Other Ambulatory Visit: Payer: Medicare Other

## 2022-03-14 ENCOUNTER — Encounter (INDEPENDENT_AMBULATORY_CARE_PROVIDER_SITE_OTHER): Payer: Self-pay

## 2022-03-28 ENCOUNTER — Other Ambulatory Visit: Payer: Medicare Other

## 2022-03-28 DIAGNOSIS — E785 Hyperlipidemia, unspecified: Secondary | ICD-10-CM

## 2022-03-28 LAB — LIPID PANEL
Chol/HDL Ratio: 2.6 ratio (ref 0.0–4.4)
Cholesterol, Total: 152 mg/dL (ref 100–199)
HDL: 59 mg/dL (ref >39)
LDL Chol Calc (NIH): 74 mg/dL (ref 0–99)
Triglycerides: 102 mg/dL (ref 0–149)
VLDL Cholesterol Cal: 19 mg/dL (ref 5–40)

## 2022-04-08 ENCOUNTER — Encounter: Payer: Self-pay | Admitting: Cardiovascular Disease

## 2022-04-08 ENCOUNTER — Ambulatory Visit: Payer: Medicare Other | Admitting: Cardiovascular Disease

## 2022-04-08 VITALS — BP 114/60 | HR 68 | Ht 60.0 in | Wt 120.6 lb

## 2022-04-08 DIAGNOSIS — E782 Mixed hyperlipidemia: Secondary | ICD-10-CM

## 2022-04-08 DIAGNOSIS — I10 Essential (primary) hypertension: Secondary | ICD-10-CM

## 2022-04-08 DIAGNOSIS — I2510 Atherosclerotic heart disease of native coronary artery without angina pectoris: Secondary | ICD-10-CM

## 2022-04-08 NOTE — Patient Instructions (Signed)
Medication Instructions:  Your physician recommends that you continue on your current medications as directed. Please refer to the Current Medication list given to you today.  *If you need a refill on your cardiac medications before your next appointment, please call your pharmacy*   Lab Work: NONE If you have labs (blood work) drawn today and your tests are completely normal, you will receive your results only by: MyChart Message (if you have MyChart) OR A paper copy in the mail If you have any lab test that is abnormal or we need to change your treatment, we will call you to review the results.   Testing/Procedures: NONE   Follow-Up: At [facility], you and your health needs are our priority.  As part of our continuing mission to provide you with exceptional heart care, we have created designated Provider Care Teams.  These Care Teams include your primary Cardiologist (physician) and Advanced Practice Providers (APPs -  Physician Assistants and Nurse Practitioners) who all work together to provide you with the care you need, when you need it.  We recommend signing up for the patient portal called "MyChart".  Sign up information is provided on this After Visit Summary.  MyChart is used to connect with patients for Virtual Visits (Telemedicine).  Patients are able to view lab/test results, encounter notes, upcoming appointments, etc.  Non-urgent messages can be sent to your provider as well.   To learn more about what you can do with MyChart, go to [email or web address].    Your next appointment:   1 year(s)  The format for your next appointment:   In Person  Provider:   Michael Cooper, MD      Important Information About Sugar       

## 2022-04-08 NOTE — Progress Notes (Signed)
?Cardiology Office Note:   ? ?Date:  04/08/2022  ? ?ID:  [name], DOB June 20, 1941, MRN [id number] ? ?PCP:  Luetta Nutting, DO ?  ?[facility] Providers ?Cardiologist:  [name], MD    ? ?Referring MD: Luetta Nutting, DO  ? ?Chief Complaint  ?Patient presents with  ? Coronary Artery Disease  ? ? ?History of Present Illness:   ? ?Kristine [name] is a 81 y.o. female with a hx of coronary artery disease, presenting for follow-up evaluation.  She initially presented with a lateral STEMI in 2014 and was treated with PCI of the circumflex.  Comorbid conditions include mixed hyperlipidemia and hypertension.  The patient had recurrent angina in 2018 and was found to have high-grade edge restenosis requiring repeat stenting.  She has done well since that time. ? ?The patient is alone today.  She has had a lot of noncardiac issues going on, including a COVID infection and extensive dental extraction.  She has not had any chest pain or pressure, dyspnea, orthopnea, or PND.  She had some elevated heart rate readings noted elsewhere and was started on metoprolol succinate at a low dose of 12.5 mg twice daily.  States she has done very well since then and brings in her home blood pressure and pulse readings which are in an excellent range. ? ?Past Medical History:  ?Diagnosis Date  ? Anemia   ? Arthritis   ? Barrett's esophagus   ? Stage B  ? CAD (coronary artery disease)   ? a. s/p STEMI 7/14 >> LHC: LM 20, mLAD 30-40, small Dx 100 (L-L collats), OM1 90, OM2 30-40, mRCA 50-60, Lat/AL akinesis, EF 35-40% >> PCI: Promus DES to OM1;  b. Myoview 3/16: diaph atten, no ischemia, EF 59% (intermediate risk b/c BP drop 152>>137, but recovery BP 162 (tech error) >> med Rx continued  c. cath 05/30/17 s/p FFR guided PCI with DES to circumflex/first OM   ? COVID-19 virus infection   ? CREST variant of scleroderma ([place])   ? Cystocele   ? Diverticulitis   ? Event monitor 04/2019  ? Event monitor 04/2019: Sinus rhythm; no atrial  fibrillation/flutter; no bradycardic events; few PVCs and occasional ventricular runs up to 9 beats  ? GERD (gastroesophageal reflux disease)   ? History of blood transfusion   ? hx of being Anti-K positive  ? History of Doppler ultrasound   ? a. Carotid US 2/12: bilateral ICA < 50%  ? Hyperlipidemia   ? Hypertension   ? Ischemic cardiomyopathy   ? a. EF 35-40% by LHC at time of MI;  b. Echo 7/14:  Mild focal basal septal hypertrophy, EF 50-55%, apical HK, grade 1 diastolic dysfunction, trivial AI   ? Ischemic colitis ([place])   ? Orthostatic hypotension   ? OSA on CPAP   ? Osteoporosis 12/2017  ? T score -2.6  ? Raynaud disease   ? Rectocele   ? mild  ? ? ?Past Surgical History:  ?Procedure Laterality Date  ? ABDOMINAL HYSTERECTOMY  1996  ? RSO for menorrhagia.  ? CORONARY ANGIOPLASTY WITH STENT PLACEMENT  06/21/2013  ? CORONARY STENT INTERVENTION N/A 05/30/2017  ? Procedure: Coronary Stent Intervention;  Surgeon: [name], MD;  Location: [facility] CV LAB;  Service: Cardiovascular;  Laterality: N/A;  ? FOOT SURGERY  2013  ? x2  ? INTRAVASCULAR PRESSURE WIRE/FFR STUDY N/A 05/30/2017  ? Procedure: Intravascular Pressure Wire/FFR Study;  Surgeon: [name], MD;  Location: [facility] CV LAB;  Service:  Cardiovascular;  Laterality: N/A;  ? JOINT REPLACEMENT  9/10  ? rt total  ? LEFT HEART CATH AND CORONARY ANGIOGRAPHY N/A 05/30/2017  ? Procedure: Left Heart Cath and Coronary Angiography;  Surgeon: [name], MD;  Location: [facility] CV LAB;  Service: Cardiovascular;  Laterality: N/A;  ? nissen fundoplication  3536  ? ROTATOR CUFF REPAIR    ? TONSILLECTOMY  1950  ? TOTAL KNEE ARTHROPLASTY    ? right  ? TUBAL LIGATION  1980  ? ? ?Current Medications: ?Current Meds  ?Medication Sig  ? acetaminophen (TYLENOL) 500 MG tablet Take 1,000 mg by mouth every 6 (six) hours as needed for headache.  ? ALPRAZolam (XANAX) 0.25 MG tablet Take 1 tablet (0.25 mg total) by mouth daily as needed for anxiety.  ? amoxicillin  (AMOXIL) 500 MG capsule Take 500 mg by mouth as needed. Per patient taking 4 capsules prior to dental appt  ? aspirin EC 81 MG EC tablet Take 1 tablet (81 mg total) by mouth daily.  ? ezetimibe (ZETIA) 10 MG tablet TAKE ONE-HALF TABLET BY  MOUTH DAILY  ? famotidine (PEPCID) 40 MG tablet Take 1 tablet by mouth at bedtime.  ? fluticasone (FLONASE) 50 MCG/ACT nasal spray Place 2 sprays into both nostrils daily. (Patient taking differently: Place 2 sprays into both nostrils as needed.)  ? metoprolol succinate (TOPROL-XL) 25 MG 24 hr tablet Take 25 mg by mouth daily. Per patient taking 1/2 tablet twice a day  ? nitroGLYCERIN (NITROSTAT) 0.4 MG SL tablet Place 1 tablet (0.4 mg total) under the tongue every 5 (five) minutes x 3 doses as needed for chest pain.  ? pantoprazole (PROTONIX) 40 MG tablet Take 40 mg by mouth daily.  ? pravastatin (PRAVACHOL) 20 MG tablet Take 1 tablet (20 mg total) by mouth every evening.  ? Probiotic Product (ALIGN PO) Take by mouth. Per patient taking 1 tablet daily  ?  ? ?Allergies:   Nsaids and Aspirin  ? ?Social History  ? ?Socioeconomic History  ? Marital status: Divorced  ?  Spouse name: Not on file  ? Number of children: Not on file  ? Years of education: Not on file  ? Highest education level: Not on file  ?Occupational History  ? Not on file  ?Tobacco Use  ? Smoking status: Never  ? Smokeless tobacco: Never  ?Vaping Use  ? Vaping Use: Never used  ?Substance and Sexual Activity  ? Alcohol use: No  ?  Alcohol/week: 0.0 standard drinks  ? Drug use: No  ? Sexual activity: Never  ?  Comment: 1st intercourse 8 yo-1 partner  ?Other Topics Concern  ? Not on file  ?Social History Narrative  ? Exercise dancing 3 x times weekly for 1-2 hours  ? ?Social Determinants of Health  ? ?Financial Resource Strain: Not on file  ?Food Insecurity: Not on file  ?Transportation Needs: Not on file  ?Physical Activity: Not on file  ?Stress: Not on file  ?Social Connections: Not on file  ?  ? ?Family  History: ?The patient's family history includes Asthma in her maternal grandmother; Cancer in her maternal grandfather and mother; Emphysema in her maternal grandmother; Heart attack in her father; Heart disease in her father; Hepatitis C in her mother; Hypertension in her daughter; Panic disorder in her daughter. There is no history of Stroke. ? ?ROS:   ?Please see the history of present illness.    ?All other systems reviewed and are negative. ? ?EKGs/Labs/Other Studies Reviewed:   ? ?  EKG:  EKG is ordered today.  The ekg ordered today demonstrates NSR 68 bpm, incomplete RBBB ? ?Recent Labs: ?05/24/2021: ALT 10; BUN 14; Creat 0.75; Hemoglobin 11.8; Platelets 193; Potassium 4.7; Sodium 142; TSH 2.30  ?Recent Lipid Panel ?   ?Component Value Date/Time  ? CHOL 152 03/28/2022 1007  ? CHOL 153 07/29/2014 0926  ? TRIG 102 03/28/2022 1007  ? TRIG 112 07/29/2014 0926  ? HDL 59 03/28/2022 1007  ? HDL 49 07/29/2014 0926  ? CHOLHDL 2.6 03/28/2022 1007  ? CHOLHDL 3.2 05/24/2021 0000  ? VLDL 17 11/23/2015 0912  ? [place] 74 03/28/2022 1007  ? [place] 90 05/24/2021 0000  ? [place] 82 07/29/2014 0926  ? ? ? ?Risk Assessment/Calculations:   ?  ? ?    ? ?Physical Exam:   ? ?VS:  BP 114/60   Pulse 68   Ht 5' (1.524 m)   Wt 120 lb 9.6 oz (54.7 kg)   LMP 11/25/1994   SpO2 94%   BMI 23.55 kg/m?    ? ?Wt Readings from Last 3 Encounters:  ?04/08/22 120 lb 9.6 oz (54.7 kg)  ?09/12/21 129 lb (58.5 kg)  ?07/24/21 125 lb 9.6 oz (57 kg)  ?  ? ?GEN:  Well nourished, well developed in no acute distress ?HEENT: Normal ?NECK: No JVD; No carotid bruits ?LYMPHATICS: No lymphadenopathy ?CARDIAC: RRR, no murmurs, rubs, gallops ?RESPIRATORY:  Clear to auscultation without rales, wheezing or rhonchi  ?ABDOMEN: Soft, non-tender, non-distended ?MUSCULOSKELETAL:  No edema; No deformity  ?SKIN: Warm and dry ?NEUROLOGIC:  Alert and oriented x 3 ?PSYCHIATRIC:  Normal affect  ? ?ASSESSMENT:   ? ?1. Coronary artery disease involving native coronary artery  of native heart without angina pectoris   ?2. Mixed hyperlipidemia   ?3. Essential hypertension   ? ?PLAN:   ? ?In order of problems listed above: ? ?The patient is doing well with no anginal symptoms.  She continues on aspirin for ant

## 2022-04-09 ENCOUNTER — Other Ambulatory Visit: Payer: Self-pay | Admitting: Cardiovascular Disease

## 2022-06-24 ENCOUNTER — Encounter (INDEPENDENT_AMBULATORY_CARE_PROVIDER_SITE_OTHER): Payer: Medicare Other | Admitting: Ophthalmology

## 2022-07-17 ENCOUNTER — Encounter: Payer: Self-pay | Admitting: General Practice

## 2022-07-22 NOTE — Progress Notes (Unsigned)
New Patient Note  RE: Kristine [name] MRN: [id number] DOB: Apr 21, 1941 Date of Office Visit: 07/23/2022  Consult requested by: Jennette Dubin, NP Primary care provider: Jennette Dubin, NP  Chief Complaint: Asthma (Has been to a [facility] 5 times. Her friend smells mold but she doesn't and the 5 times she's been she has experienced breathing problems. ) and Allergic Rhinitis  (Sneezing, stuffy nose,congestion )  History of Present Illness: I had the pleasure of seeing Kristine [name] for initial evaluation at the [facility] on 07/24/2022. She is a 81 y.o. female, who is referred here by Jennette Dubin, NP for the evaluation of allergic rhinitis and asthma.  Rhinitis:  She reports symptoms of sneezing, nasal congestion, itchy eyes. Symptoms have been going on for 15+ years. The symptoms are present mainly in the spring. Anosmia: no. Headache: no. She has used Claritin, Flonase with fair improvement in symptoms. Sinus infections: not recently. Previous work up includes: none. Previous ENT evaluation: yes for dizziness. Previous sinus imaging: no. History of nasal polyps: no. Last eye exam: 2 months. History of reflux: yes and takes pantoprazole. Follows with GI.  Breathing:  She reports symptoms of chest tightness, shortness of breath, coughing, wheezing for 1 years. Current medications include albuterol prn which help. She reports not using aerochamber with inhalers. She tried the following inhalers: Breo. Main triggers are going to the beach.  In the last month, frequency of symptoms: 3-4x/week. Frequency of SABA use: 3-4x/week. In the last 12 months, emergency room visits/urgent care visits/doctor office visits or hospitalizations due to respiratory issues: one. In the last 12 months, oral steroids courses: twice but it made her very hyper. Lifetime history of hospitalization for respiratory issues: no. Prior intubations: no. History of pneumonia: yes. She  was evaluated by pulmonologist in the past. Smoking exposure: denies. Up to date with flu vaccine: no. Up to date with pneumonia vaccine: yes. Up to date with COVID-19 vaccine: yes. Prior Covid-19 infection: twice.  Lost some weight on and off this year.   Assessment and Plan: Kristine [name] is a 81 y.o. female with: Mild intermittent reactive airway disease Noted issues with her breathing for the last 1 year especially when going to [facility].  Required 2 courses of prednisone to date.  Using albuterol 3-4 times per week with good benefit still. Concerned about mold allergies. Had COVID-19 twice. Saw pulmonology in the past due to cough and CREST syndrome. Also has GERD and CAD. Today's spirometry showed: possible restrictive disease with 7% improvement in FEV1 post bronchodilator treatment. Clinically feeling unchanged.  Today's skin testing showed: Negative to indoor/outdoor allergens.  Spacer given and demonstrated proper use with inhaler. Patient understood technique and all questions/concerned were addressed.  Get chest X-ray. During breathing flares: Start Symbicort 38mg 2 puffs once a day with spacer and rinse mouth afterwards for 1-2 weeks until your breathing symptoms return to baseline.  May use albuterol rescue inhaler 1-2 puffs every 4 to 6 hours as needed for shortness of breath, chest tightness, coughing, and wheezing. Monitor frequency of use.  Get spirometry at next visit. Keep pulmonology appointment.  Chronic rhinitis Rhinoconjunctivitis symptoms mainly in the spring for the last 15+ years.  Tried Claritin and Flonase with good benefit.  No prior allergy evaluation. Today's skin testing showed: Negative to indoor/outdoor allergens.  Use Flonase (fluticasone) nasal spray 1 spray per nostril twice a day as needed for nasal congestion.  Hold antihistamines as complaining of mucosal  dryness.   GERD (gastroesophageal reflux disease) Follows with GI. See handout for lifestyle and  dietary modifications. Continue pantoprazole.  Return in about 3 months (around 10/23/2022).  Meds ordered this encounter  Medications   budesonide-formoterol (SYMBICORT) 80-4.5 MCG/ACT inhaler    Sig: Inhale 2 puffs into the lungs in the morning and at bedtime. with spacer and rinse mouth afterwards.    Dispense:  1 each    Refill:  3   Lab Orders  No laboratory test(s) ordered today    Other allergy screening: Food allergy: no Avoiding spicy foods and soda due to GERD.  Medication allergy: no Hymenoptera allergy: no Urticaria: no Eczema:no History of recurrent infections suggestive of immunodeficency: no  Diagnostics: Spirometry:  Tracings reviewed. Her effort: Good reproducible efforts. FVC: 1.45L FEV1: 1.16L, 73% predicted FEV1/FVC ratio: 80% Interpretation: Spirometry consistent with possible restrictive disease with 7% improvement in FEV1 post bronchodilator treatment. Clinically feeling unchanged.   Please see scanned spirometry results for details.  Skin Testing: Environmental allergy panel. Negative to indoor/outdoor allergens.  Results discussed with patient/family.  Airborne Adult Perc - 07/23/22 1540     Time Antigen Placed 1540    Allergen Manufacturer Lavella Hammock    Location Back    Number of Test 59    1. Control-Buffer 50% Glycerol Negative    2. Control-Histamine 1 mg/ml 2+    3. Albumin saline Negative    4. Paradise Negative    5. [place] Negative    6. Johnson Negative    7. [place] Blue Negative    8. Meadow Fescue Negative    9. Perennial Rye Negative    10. Sweet Vernal Negative    11. Timothy Negative    12. Cocklebur Negative    13. Burweed Marshelder Negative    14. Ragweed, short Negative    15. Ragweed, Giant Negative    16. Plantain,  English Negative    17. Lamb's Quarters Negative    18. Sheep Sorrell Negative    19. Rough Pigweed Negative    20. Marsh Elder, Rough Negative    21. Mugwort, Common Negative    22. Ash mix Negative     23. Birch mix Negative    24. Beech American Negative    25. Box, Elder Negative    26. Cedar, red Negative    27. Cottonwood, [place] Negative    28. Elm mix Negative    29. Hickory Negative    30. Maple mix Negative    31. Oak, [place] mix Negative    32. Pecan Pollen Negative    33. Pine mix Negative    34. Sycamore Eastern Negative    35. [place], Black Pollen Negative    36. Alternaria alternata Negative    37. Cladosporium Herbarum Negative    38. Aspergillus mix Negative    39. Penicillium mix Negative    40. Bipolaris sorokiniana (Helminthosporium) Negative    41. Drechslera spicifera (Curvularia) Negative    42. Mucor plumbeus Negative    43. Fusarium moniliforme Negative    44. Aureobasidium pullulans (pullulara) Negative    45. Rhizopus oryzae Negative    46. Botrytis cinera Negative    47. Epicoccum nigrum Negative    48. Phoma betae Negative    49. Candida Albicans Negative    50. Trichophyton mentagrophytes Negative    51. Mite, D Farinae  5,000 AU/ml Negative    52. Mite, D Pteronyssinus  5,000 AU/ml Negative    53. Cat Hair 10,000  BAU/ml Negative    54.  Dog Epithelia Negative    55. Mixed Feathers Negative    56. Horse Epithelia Negative    57. Cockroach, German Negative    58. Mouse Negative    59. Tobacco Leaf Negative             Intradermal - 07/23/22 1554     Time Antigen Placed 3818    Allergen Manufacturer Lavella Hammock    Location Arm    Number of Test 15    Control Negative    Guatemala Negative    Johnson Negative    7 Grass Negative    Ragweed mix Negative    Weed mix Negative    Tree mix Negative    Mold 1 Negative    Mold 2 Negative    Mold 3 Negative    Mold 4 Negative    Cat Negative    Dog Negative    Cockroach Negative    Mite mix Negative             Past Medical History: Patient Active Problem List   Diagnosis Date Noted   Mild intermittent reactive airway disease 07/24/2022   Chronic rhinitis 07/24/2022   Vertigo  07/24/2021   Pseudophakia of left eye 05/31/2021   Pseudophakia of right eye 05/31/2021   History of osteoporosis 05/27/2021   Mononucleosis 05/27/2021   Cystoid macular edema of left eye 10/11/2020   MI (myocardial infarction) ([place]) 10/09/2020   COVID-19 virus infection 05/04/2020   Type 2 macular telangiectasis, right 03/20/2020   Cystoid macular edema of right eye 03/20/2020   Type 2 macular telangiectasis, left 03/20/2020   OSA on CPAP 03/20/2020   Cr(e)st syndrome ([place]) 05/25/2018   Ulcer of esophagus with bleeding 02/27/2018   Psychophysiological insomnia 09/24/2017   Abnormal nuclear cardiac imaging test 05/30/2017   Abnormal stress echo    Solar lentigo 11/08/2016   Acute blood loss anemia 10/10/2015   Red blood cell antibody positive 10/04/2015   S/P coronary artery stent placement 07/12/2014   CAD (coronary artery disease), native coronary artery 06/27/2013   Hyperlipidemia 06/27/2013   Chest pain of uncertain etiology 29/93/7169   History of ST elevation myocardial infarction (STEMI) 06/24/2013   Hypertension    Great toe pain 09/11/2012   H/O foot surgery 04/09/2012   Hallux varus (acquired) 02/25/2012   GERD (gastroesophageal reflux disease)    Barrett's esophagus    Cystocele    Rectocele    Past Medical History:  Diagnosis Date   Anemia    Arthritis    Asthma    Barrett's esophagus    Stage B   CAD (coronary artery disease)    a. s/p STEMI 7/14 >> LHC: LM 20, mLAD 30-40, small Dx 100 (L-L collats), OM1 90, OM2 30-40, mRCA 50-60, Lat/AL akinesis, EF 35-40% >> PCI: Promus DES to OM1;  b. Myoview 3/16: diaph atten, no ischemia, EF 59% (intermediate risk b/c BP drop 152>>137, but recovery BP 162 (tech error) >> med Rx continued  c. cath 05/30/17 s/p FFR guided PCI with DES to circumflex/first OM    COVID-19 virus infection    CREST variant of scleroderma ([place])    Cystocele    Diverticulitis    Event monitor 04/2019   Event monitor 04/2019: Sinus rhythm; no  atrial fibrillation/flutter; no bradycardic events; few PVCs and occasional ventricular runs up to 9 beats   GERD (gastroesophageal reflux disease)    History of blood transfusion  hx of being Anti-K positive   History of Doppler ultrasound    a. Carotid US 2/12: bilateral ICA < 50%   Hyperlipidemia    Hypertension    Ischemic cardiomyopathy    a. EF 35-40% by LHC at time of MI;  b. Echo 7/14:  Mild focal basal septal hypertrophy, EF 50-55%, apical HK, grade 1 diastolic dysfunction, trivial AI    Ischemic colitis ([place])    Orthostatic hypotension    OSA on CPAP    Osteoporosis 12/2017   T score -2.6   Raynaud disease    Rectocele    mild   Past Surgical History: Past Surgical History:  Procedure Laterality Date   [place] for menorrhagia.   CORONARY ANGIOPLASTY WITH STENT PLACEMENT  06/21/2013   CORONARY STENT INTERVENTION N/A 05/30/2017   Procedure: Coronary Stent Intervention;  Surgeon: [name], MD;  Location: [facility];  Service: Cardiovascular;  Laterality: N/A;   FOOT SURGERY  2013   x2   INTRAVASCULAR PRESSURE WIRE/FFR STUDY N/A 05/30/2017   Procedure: Intravascular Pressure Wire/FFR Study;  Surgeon: [name], MD;  Location: [place] [facility];  Service: Cardiovascular;  Laterality: N/A;   JOINT REPLACEMENT  9/10   rt total   LEFT HEART CATH AND CORONARY ANGIOGRAPHY N/A 05/30/2017   Procedure: Left Heart Cath and Coronary Angiography;  Surgeon: [name], MD;  Location: [place] [facility];  Service: Cardiovascular;  Laterality: N/A;   nissen fundoplication  9518   ROTATOR CUFF REPAIR     TONSILLECTOMY  1950   TOTAL KNEE ARTHROPLASTY     right   TUBAL LIGATION  1980   Medication List:  Current Outpatient Medications  Medication Sig Dispense Refill   acetaminophen (TYLENOL) 500 MG tablet Take 1,000 mg by mouth every 6 (six) hours as needed for headache.     albuterol (VENTOLIN HFA) 108 (90 Base) MCG/ACT inhaler 1 puff  as needed Inhalation every 4 hrs     ALPRAZolam (XANAX) 0.25 MG tablet Take 1 tablet (0.25 mg total) by mouth daily as needed for anxiety. 30 tablet 0   amoxicillin (AMOXIL) 500 MG capsule Take 500 mg by mouth as needed. Per patient taking 4 capsules prior to dental appt     budesonide-formoterol (SYMBICORT) 80-4.5 MCG/ACT inhaler Inhale 2 puffs into the lungs in the morning and at bedtime. with spacer and rinse mouth afterwards. 1 each 3   ezetimibe (ZETIA) 10 MG tablet TAKE ONE-HALF TABLET BY  MOUTH DAILY 45 tablet 1   ezetimibe (ZETIA) 10 MG tablet Take 10 mg by mouth daily. Per patient Durene Cal 1/2 tablet daily     famotidine (PEPCID) 40 MG tablet Take 1 tablet by mouth at bedtime.     fluticasone (FLONASE) 50 MCG/ACT nasal spray Place 2 sprays into both nostrils daily. (Patient taking differently: Place 2 sprays into both nostrils as needed.) 16 g 0   Iron, Ferrous Sulfate, 325 (65 Fe) MG TABS 1 tablet Orally Three times a Week for 30 day(s)     loratadine (CLARITIN) 10 MG tablet 1 tablet Orally Once a day     metoprolol succinate (TOPROL-XL) 25 MG 24 hr tablet Take 25 mg by mouth daily. Per patient taking 1/2 tablet twice a day     nitroGLYCERIN (NITROSTAT) 0.4 MG SL tablet Place 1 tablet (0.4 mg total) under the tongue every 5 (five) minutes x 3 doses as needed for chest pain. 75 tablet 1  pantoprazole (PROTONIX) 40 MG tablet Take 40 mg by mouth daily.     pravastatin (PRAVACHOL) 20 MG tablet TAKE 1 TABLET BY MOUTH IN  THE EVENING 90 tablet 3   Probiotic Product (ALIGN PO) Take by mouth. Per patient taking 1 tablet daily     aspirin EC 81 MG EC tablet Take 1 tablet (81 mg total) by mouth daily. (Patient not taking: Reported on 07/23/2022)     No current facility-administered medications for this visit.   Allergies: Allergies  Allergen Reactions   Nsaids Other (See Comments)    Pt is prone to bleeding   Aspirin Other (See Comments)    Other reaction(s): Other Can take 81 mg but not  aspirin based medications Pt is prone to bleeding, ok with low dose coated aspirin   Social History: Social History   Socioeconomic History   Marital status: Divorced    Spouse name: Not on file   Number of children: Not on file   Years of education: Not on file   Highest education level: Not on file  Occupational History   Not on file  Tobacco Use   Smoking status: Never   Smokeless tobacco: Never  Vaping Use   Vaping Use: Never used  Substance and Sexual Activity   Alcohol use: No    Alcohol/week: 0.0 standard drinks of alcohol   Drug use: No   Sexual activity: Never    Comment: 1st intercourse 46 yo-1 partner  Other Topics Concern   Not on file  Social History Narrative   Exercise dancing 3 x times weekly for 1-2 hours   Social Determinants of Health   Financial Resource Strain: Not on file  Food Insecurity: Not on file  Transportation Needs: Not on file  Physical Activity: Not on file  Stress: Not on file  Social Connections: Not on file   Lives in a 79 year old house. Smoking: denies Occupation: retired  Programme researcher, broadcasting/film/video HistoryFreight forwarder in the house: no Charity fundraiser in the family room: yes Carpet in the bedroom: yes Heating: gas Cooling: central Pet: yes 1 cat x 7 yrs  Family History: Family History  Problem Relation Age of Onset   Cancer Mother        Pancreatic Cancer   Hepatitis C Mother    Heart disease Father        congestive heart failure   Heart attack Father    Panic disorder Daughter    Hypertension Daughter    Asthma Maternal Grandmother    Emphysema Maternal Grandmother    Cancer Maternal Grandfather        lung   Stroke Neg Hx    Review of Systems  Constitutional:  Negative for appetite change, chills, fever and unexpected weight change.  HENT:  Positive for congestion and sneezing.   Eyes:  Negative for itching.  Respiratory:  Positive for cough. Negative for chest tightness, shortness of breath and wheezing.    Cardiovascular:  Negative for chest pain.  Gastrointestinal:  Negative for abdominal pain.  Genitourinary:  Negative for difficulty urinating.  Skin:  Negative for rash.  Allergic/Immunologic: Negative for environmental allergies.  Neurological:  Negative for headaches.    Objective: BP 114/76   Pulse (!) 57   Temp (!) 97.4 F (36.3 C)   Resp 16   Ht 5' (1.524 m)   Wt 122 lb 8 oz (55.6 kg)   LMP 11/25/1994   SpO2 99%   BMI 23.92 kg/m  Body mass index  is 23.92 kg/m. Physical Exam Vitals and nursing note reviewed.  Constitutional:      Appearance: Normal appearance. She is well-developed.  HENT:     Head: Normocephalic and atraumatic.     Right Ear: Tympanic membrane and external ear normal.     Left Ear: Tympanic membrane and external ear normal.     Nose: Nose normal.     Mouth/Throat:     Mouth: Mucous membranes are moist.     Pharynx: Oropharynx is clear.  Eyes:     Conjunctiva/sclera: Conjunctivae normal.  Cardiovascular:     Rate and Rhythm: Normal rate and regular rhythm.     Heart sounds: Normal heart sounds. No murmur heard.    No friction rub. No gallop.  Pulmonary:     Effort: Pulmonary effort is normal.     Breath sounds: Normal breath sounds. No wheezing, rhonchi or rales.  Musculoskeletal:     Cervical back: Neck supple.  Skin:    General: Skin is warm.     Findings: No rash.  Neurological:     Mental Status: She is alert and oriented to person, place, and time.  Psychiatric:        Behavior: Behavior normal.    The plan was reviewed with the patient/family, and all questions/concerned were addressed.  It was my pleasure to see Eline today and participate in her care. Please feel free to contact me with any questions or concerns.  Sincerely,  Rexene Alberts, DO Allergy & Immunology  [facility] office: [place] office: [phone]

## 2022-07-23 ENCOUNTER — Encounter: Payer: Self-pay | Admitting: Allergy

## 2022-07-23 ENCOUNTER — Ambulatory Visit: Payer: Medicare Other | Admitting: Allergy

## 2022-07-23 VITALS — BP 114/76 | HR 57 | Temp 97.4°F | Resp 16 | Ht 60.0 in | Wt 122.5 lb

## 2022-07-23 DIAGNOSIS — R053 Chronic cough: Secondary | ICD-10-CM

## 2022-07-23 DIAGNOSIS — J310 Chronic rhinitis: Secondary | ICD-10-CM

## 2022-07-23 DIAGNOSIS — J4520 Mild intermittent asthma, uncomplicated: Secondary | ICD-10-CM | POA: Diagnosis not present

## 2022-07-23 DIAGNOSIS — K219 Gastro-esophageal reflux disease without esophagitis: Secondary | ICD-10-CM | POA: Diagnosis not present

## 2022-07-23 MED ORDER — BUDESONIDE-FORMOTEROL FUMARATE 80-4.5 MCG/ACT IN AERO: 2.0000 | INHALATION_SPRAY | Freq: Two times a day (BID) | RESPIRATORY_TRACT | 3 refills | Status: DC

## 2022-07-23 NOTE — Patient Instructions (Addendum)
Today's skin testing showed: Negative to indoor/outdoor allergens.   Results given.  Rhinitis:  Use Flonase (fluticasone) nasal spray 1 spray per nostril twice a day as needed for nasal congestion.   Breathing Spacer given and demonstrated proper use with inhaler. Patient understood technique and all questions/concerned were addressed.  Get chest X-ray. During breathing flares: Start Symbicort 46mg 2 puffs once a day with spacer and rinse mouth afterwards for 1-2 weeks until your breathing symptoms return to baseline.  May use albuterol rescue inhaler 1-2 puffs every 4 to 6 hours as needed for shortness of breath, chest tightness, coughing, and wheezing. Monitor frequency of use.  Breathing control goals:  Full participation in all desired activities (may need albuterol before activity) Albuterol use two times or less a week on average (not counting use with activity) Cough interfering with sleep two times or less a month Oral steroids no more than once a year No hospitalizations   Heartburn: See handout for lifestyle and dietary modifications. Continue pantoprazole.  Follow up in 3 months or sooner if needed.

## 2022-07-24 ENCOUNTER — Encounter: Payer: Self-pay | Admitting: Allergy

## 2022-07-24 DIAGNOSIS — J4520 Mild intermittent asthma, uncomplicated: Secondary | ICD-10-CM | POA: Insufficient documentation

## 2022-07-24 DIAGNOSIS — J310 Chronic rhinitis: Secondary | ICD-10-CM | POA: Insufficient documentation

## 2022-07-24 NOTE — Assessment & Plan Note (Signed)
Follows with GI.  See handout for lifestyle and dietary modifications.  Continue pantoprazole.

## 2022-07-24 NOTE — Assessment & Plan Note (Addendum)
Noted issues with her breathing for the last 1 year especially when going to [facility].  Required 2 courses of prednisone to date.  Using albuterol 3-4 times per week with good benefit still. Concerned about mold allergies. Had COVID-19 twice. Saw pulmonology in the past due to cough and CREST syndrome. Also has GERD and CAD.  Today's spirometry showed: possible restrictive disease with 7% improvement in FEV1 post bronchodilator treatment. Clinically feeling unchanged.   Today's skin testing showed: Negative to indoor/outdoor allergens.  Marland Kitchen Spacer given and demonstrated proper use with inhaler. Patient understood technique and all questions/concerned were addressed.  . Get chest X-ray. . During breathing flares: . Start Symbicort 61mg 2 puffs once a day with spacer and rinse mouth afterwards for 1-2 weeks until your breathing symptoms return to baseline.  . May use albuterol rescue inhaler 1-2 puffs every 4 to 6 hours as needed for shortness of breath, chest tightness, coughing, and wheezing. Monitor frequency of use.  . Get spirometry at next visit. .Marland KitchenKeep pulmonology appointment.

## 2022-07-24 NOTE — Assessment & Plan Note (Signed)
Rhinoconjunctivitis symptoms mainly in the spring for the last 15+ years.  Tried Claritin and Flonase with good benefit.  No prior allergy evaluation.  Today's skin testing showed: Negative to indoor/outdoor allergens.   Use Flonase (fluticasone) nasal spray 1 spray per nostril twice a day as needed for nasal congestion.   Hold antihistamines as complaining of mucosal dryness.

## 2022-07-31 ENCOUNTER — Telehealth: Payer: Self-pay | Admitting: Allergy

## 2022-07-31 NOTE — Telephone Encounter (Signed)
Patient called and would like to talk with nurse about the inhaler that dr Maudie Mercury put her on. She said that the roof of her mouth is sore and wanted to know if she should still use the inhaler . [phone]

## 2022-07-31 NOTE — Telephone Encounter (Signed)
Please call patient back and verify that she is using the Symbicort 2 puffs once a day with spacer and use mouthwash to rinse out afterwards.  See if that helps.

## 2022-07-31 NOTE — Telephone Encounter (Signed)
Called patient - verified/confirmed she's rinsing her mouth out and using spacer.

## 2022-07-31 NOTE — Telephone Encounter (Signed)
Called patient back - DOB verified - stated the following:   She has a top denture  - since using the Symbicort (began 07/23/22) she doesn't if the inhaler is causing irritation and soreness in the roof of her mouth or not. Patient stated she noticed the soreness and irritation yesterday, Tuesday, 08/19/22 - didn't use inhaler today due to the symptoms.  Patient advised message will be forwarded to provider for next step. Patient advised to discontinue use of Symbicort until she receives a call back from our office.   Patient verbalized understanding, no further questions.

## 2022-07-31 NOTE — Telephone Encounter (Signed)
Called patient - DOB verified - advised of provider notation.   Patient verbalized understanding, no further questions.

## 2022-07-31 NOTE — Telephone Encounter (Signed)
Correction to call back number for patient. [phone]

## 2022-07-31 NOTE — Telephone Encounter (Signed)
She may stop inhaler and see if her oral symptoms improve. Monitor breathing symptoms.

## 2022-08-02 ENCOUNTER — Ambulatory Visit
Admission: RE | Admit: 2022-08-02 | Discharge: 2022-08-02 | Disposition: A | Discharge status: Home / Self Care | Payer: Medicare Other | Source: Ambulatory Visit | Attending: Allergy | Admitting: Allergy

## 2022-08-04 NOTE — Progress Notes (Deleted)
Synopsis: Referred for asthma by Jennette Dubin, NP  Subjective:   PATIENT ID: Kristine [name] GENDER: female DOB: 01-18-1941, MRN: [id number]  No chief complaint on file.  81yF with history of asthma, barrett's esophagus, CAD, CREST, covid-19, ICM, OSA on CPAP  Seen by allergy 07/23/22 with mild intermittent RAD started on low dose symbicort, chronic non allergic rhinitis (neg RAST)  Otherwise pertinent review of systems is negative.  Past Medical History:  Diagnosis Date   Anemia    Arthritis    Asthma    Barrett's esophagus    Stage B   CAD (coronary artery disease)    a. s/p STEMI 7/14 >> LHC: LM 20, mLAD 30-40, small Dx 100 (L-L collats), OM1 90, OM2 30-40, mRCA 50-60, Lat/AL akinesis, EF 35-40% >> PCI: Promus DES to OM1;  b. Myoview 3/16: diaph atten, no ischemia, EF 59% (intermediate risk b/c BP drop 152>>137, but recovery BP 162 (tech error) >> med Rx continued  c. cath 05/30/17 s/p FFR guided PCI with DES to circumflex/first OM    COVID-19 virus infection    CREST variant of scleroderma ([place])    Cystocele    Diverticulitis    Event monitor 04/2019   Event monitor 04/2019: Sinus rhythm; no atrial fibrillation/flutter; no bradycardic events; few PVCs and occasional ventricular runs up to 9 beats   GERD (gastroesophageal reflux disease)    History of blood transfusion    hx of being Anti-K positive   History of Doppler ultrasound    a. Carotid US 2/12: bilateral ICA < 50%   Hyperlipidemia    Hypertension    Ischemic cardiomyopathy    a. EF 35-40% by LHC at time of MI;  b. Echo 7/14:  Mild focal basal septal hypertrophy, EF 50-55%, apical HK, grade 1 diastolic dysfunction, trivial AI    Ischemic colitis ([place])    Orthostatic hypotension    OSA on CPAP    Osteoporosis 12/2017   T score -2.6   Raynaud disease    Rectocele    mild     Family History  Problem Relation Age of Onset   Cancer Mother        Pancreatic Cancer   Hepatitis C Mother    Heart disease Father         congestive heart failure   Heart attack Father    Panic disorder Daughter    Hypertension Daughter    Asthma Maternal Grandmother    Emphysema Maternal Grandmother    Cancer Maternal Grandfather        lung   Stroke Neg Hx      Past Surgical History:  Procedure Laterality Date   ABDOMINAL HYSTERECTOMY  1996   RSO for menorrhagia.   CORONARY ANGIOPLASTY WITH STENT PLACEMENT  06/21/2013   CORONARY STENT INTERVENTION N/A 05/30/2017   Procedure: Coronary Stent Intervention;  Surgeon: [name], MD;  Location: [facility];  Service: Cardiovascular;  Laterality: N/A;   FOOT SURGERY  2013   x2   INTRAVASCULAR PRESSURE WIRE/FFR STUDY N/A 05/30/2017   Procedure: Intravascular Pressure Wire/FFR Study;  Surgeon: [name], MD;  Location: [facility];  Service: Cardiovascular;  Laterality: N/A;   JOINT REPLACEMENT  9/10   rt total   LEFT HEART CATH AND CORONARY ANGIOGRAPHY N/A 05/30/2017   Procedure: Left Heart Cath and Coronary Angiography;  Surgeon: [name], MD;  Location: [facility];  Service: Cardiovascular;  Laterality: N/A;   nissen fundoplication  2006   ROTATOR CUFF REPAIR     TONSILLECTOMY  1950   TOTAL KNEE ARTHROPLASTY     right   TUBAL LIGATION  1980    Social History   Socioeconomic History   Marital status: Divorced    Spouse name: Not on file   Number of children: Not on file   Years of education: Not on file   Highest education level: Not on file  Occupational History   Not on file  Tobacco Use   Smoking status: Never   Smokeless tobacco: Never  Vaping Use   Vaping Use: Never used  Substance and Sexual Activity   Alcohol use: No    Alcohol/week: 0.0 standard drinks of alcohol   Drug use: No   Sexual activity: Never    Comment: 1st intercourse 59 yo-1 partner  Other Topics Concern   Not on file  Social History Narrative   Exercise dancing 3 x times weekly for 1-2 hours   Social Determinants of Health    Financial Resource Strain: Not on file  Food Insecurity: Not on file  Transportation Needs: Not on file  Physical Activity: Not on file  Stress: Not on file  Social Connections: Not on file  Intimate Partner Violence: Not on file     Allergies  Allergen Reactions   Nsaids Other (See Comments)    Pt is prone to bleeding   Aspirin Other (See Comments)    Other reaction(s): Other Can take 81 mg but not aspirin based medications Pt is prone to bleeding, ok with low dose coated aspirin     Outpatient Medications Prior to Visit  Medication Sig Dispense Refill   acetaminophen (TYLENOL) 500 MG tablet Take 1,000 mg by mouth every 6 (six) hours as needed for headache.     albuterol (VENTOLIN HFA) 108 (90 Base) MCG/ACT inhaler 1 puff as needed Inhalation every 4 hrs     ALPRAZolam (XANAX) 0.25 MG tablet Take 1 tablet (0.25 mg total) by mouth daily as needed for anxiety. 30 tablet 0   amoxicillin (AMOXIL) 500 MG capsule Take 500 mg by mouth as needed. Per patient taking 4 capsules prior to dental appt     aspirin EC 81 MG EC tablet Take 1 tablet (81 mg total) by mouth daily. (Patient not taking: Reported on 07/23/2022)     budesonide-formoterol (SYMBICORT) 80-4.5 MCG/ACT inhaler Inhale 2 puffs into the lungs in the morning and at bedtime. with spacer and rinse mouth afterwards. 1 each 3   ezetimibe (ZETIA) 10 MG tablet TAKE ONE-HALF TABLET BY  MOUTH DAILY 45 tablet 1   ezetimibe (ZETIA) 10 MG tablet Take 10 mg by mouth daily. Per patient Durene Cal 1/2 tablet daily     famotidine (PEPCID) 40 MG tablet Take 1 tablet by mouth at bedtime.     fluticasone (FLONASE) 50 MCG/ACT nasal spray Place 2 sprays into both nostrils daily. (Patient taking differently: Place 2 sprays into both nostrils as needed.) 16 g 0   Iron, Ferrous Sulfate, 325 (65 Fe) MG TABS 1 tablet Orally Three times a Week for 30 day(s)     loratadine (CLARITIN) 10 MG tablet 1 tablet Orally Once a day     metoprolol succinate (TOPROL-XL)  25 MG 24 hr tablet Take 25 mg by mouth daily. Per patient taking 1/2 tablet twice a day     nitroGLYCERIN (NITROSTAT) 0.4 MG SL tablet Place 1 tablet (0.4 mg total) under the tongue every 5 (five) minutes x 3 doses  as needed for chest pain. 75 tablet 1   pantoprazole (PROTONIX) 40 MG tablet Take 40 mg by mouth daily.     pravastatin (PRAVACHOL) 20 MG tablet TAKE 1 TABLET BY MOUTH IN  THE EVENING 90 tablet 3   Probiotic Product (ALIGN PO) Take by mouth. Per patient taking 1 tablet daily     No facility-administered medications prior to visit.       Objective:   Physical Exam:  General appearance: 81 y.o., female, NAD, conversant  Eyes: anicteric sclerae; PERRL, tracking appropriately HENT: NCAT; MMM Neck: Trachea midline; no lymphadenopathy, no JVD Lungs: CTAB, no crackles, no wheeze, with normal respiratory effort CV: RRR, no murmur  Abdomen: Soft, non-tender; non-distended, BS present  Extremities: No peripheral edema, warm Skin: Normal turgor and texture; no rash Psych: Appropriate affect Neuro: Alert and oriented to person and place, no focal deficit     There were no vitals filed for this visit.   on *** LPM *** RA BMI Readings from Last 3 Encounters:  07/23/22 23.92 kg/m  04/08/22 23.55 kg/m  09/12/21 24.37 kg/m   Wt Readings from Last 3 Encounters:  07/23/22 122 lb 8 oz (55.6 kg)  04/08/22 120 lb 9.6 oz (54.7 kg)  09/12/21 129 lb (58.5 kg)     CBC    Component Value Date/Time   WBC 7.8 05/24/2021 0000   RBC 4.70 05/24/2021 0000   HGB 11.8 05/24/2021 0000   HGB 8.0 (L) 03/23/2019 0000   HCT 38.3 05/24/2021 0000   HCT 28.8 (L) 03/23/2019 0000   PLT 193 05/24/2021 0000   PLT 192 03/23/2019 0000   MCV 81.5 05/24/2021 0000   MCV 69 (L) 03/23/2019 0000   MCH 25.1 (L) 05/24/2021 0000   MCHC 30.8 (L) 05/24/2021 0000   RDW 14.4 05/24/2021 0000   RDW 17.9 (H) 03/23/2019 0000   LYMPHSABS 1,989 05/24/2021 0000   LYMPHSABS 2.1 03/23/2019 0000   MONOABS 1.1  (H) 11/01/2015 0040   EOSABS 234 05/24/2021 0000   EOSABS 0.2 03/23/2019 0000   BASOSABS 47 05/24/2021 0000   BASOSABS 0.1 03/23/2019 0000    Eos 200  Chest Imaging: CXR 08/02/22 with massive dilation of esophagus  HRCT 05/2018: FINDINGS:Both lungs are clear with no evidence of consolidation, nodules, pulmonary edema. There is no honeycombing or subpleural reticulation. No traction bronchiectasis. There is no mosaic attenuation or abnormal air trapping on the expiratory images.  No pleural effusion or pneumothorax. There is no adenopathy within the chest. The esophagus is markedly patulous and contains fluid and debris.   No osseous lesions. Degenerative changes are present within the axial spine and at the right shoulder. The visualized upper abdomen is unremarkable.    Heart size is normal. Main pulmonary artery is normal in caliber. Incidental note is made of an aberrant right subclavian artery. Thoracic aorta is normal in caliber. Heavy coronary artery atherosclerotic calcifications are noted with stenting  suspected.  Pulmonary Functions Testing Results:     No data to display         Arlyce Harman pre post 07/23/22 reviewed by me with borderline obstruction, reduced FVC, neg BD response FeNO: ***  Pathology: ***  Echocardiogram: ***  Heart Catheterization: ***    Assessment & [name]:    [name]:      Maryjane Hurter, MD [facility] 08/04/2022 2:44 PM

## 2022-08-05 ENCOUNTER — Encounter (INDEPENDENT_AMBULATORY_CARE_PROVIDER_SITE_OTHER): Payer: Medicare Other | Admitting: Ophthalmology

## 2022-08-06 ENCOUNTER — Telehealth: Payer: Self-pay

## 2022-08-06 ENCOUNTER — Institutional Professional Consult (permissible substitution): Payer: Medicare Other | Admitting: Student

## 2022-08-06 NOTE — Telephone Encounter (Signed)
Pt lvm requesting MyChart username and password due to new phone/reset.   Returned pt's call. LVM advising we have username. Unable to view password but we can reset it for her, if necessary. Callback information provided.

## 2022-08-20 ENCOUNTER — Ambulatory Visit (INDEPENDENT_AMBULATORY_CARE_PROVIDER_SITE_OTHER): Payer: Medicare Other | Admitting: Ophthalmology

## 2022-08-20 ENCOUNTER — Encounter (INDEPENDENT_AMBULATORY_CARE_PROVIDER_SITE_OTHER): Payer: Self-pay | Admitting: Ophthalmology

## 2022-08-20 DIAGNOSIS — H35072 Retinal telangiectasis, left eye: Secondary | ICD-10-CM

## 2022-08-20 DIAGNOSIS — H35352 Cystoid macular degeneration, left eye: Secondary | ICD-10-CM | POA: Diagnosis not present

## 2022-08-20 DIAGNOSIS — H35071 Retinal telangiectasis, right eye: Secondary | ICD-10-CM

## 2022-08-20 NOTE — Assessment & Plan Note (Signed)
No active CME

## 2022-08-20 NOTE — Progress Notes (Signed)
08/20/2022     CHIEF COMPLAINT Patient presents for No chief complaint on file.     HISTORY OF PRESENT ILLNESS: Kristine [name] is a 81 y.o. female who presents to the clinic today for:   HPI   Type 2 macular telangiectasis right Pt states her vision has been stable Pt admits to new floaters  Pt denies FOL Pt states she has not been wearing her CPAP  Last edited by Morene Rankins, CMA on 08/20/2022  2:47 PM.      Referring physician: Luetta Nutting, DO [address] [place],  [place] [address]  HISTORICAL INFORMATION:   Selected notes from the [facility]: No current outpatient medications on file. (Ophthalmic Drugs)   No current facility-administered medications for this visit. (Ophthalmic Drugs)   Current Outpatient Medications (Other)  Medication Sig   acetaminophen (TYLENOL) 500 MG tablet Take 1,000 mg by mouth every 6 (six) hours as needed for headache.   albuterol (VENTOLIN HFA) 108 (90 Base) MCG/ACT inhaler 1 puff as needed Inhalation every 4 hrs   ALPRAZolam (XANAX) 0.25 MG tablet Take 1 tablet (0.25 mg total) by mouth daily as needed for anxiety.   amoxicillin (AMOXIL) 500 MG capsule Take 500 mg by mouth as needed. Per patient taking 4 capsules prior to dental appt   aspirin EC 81 MG EC tablet Take 1 tablet (81 mg total) by mouth daily. (Patient not taking: Reported on 07/23/2022)   budesonide-formoterol (SYMBICORT) 80-4.5 MCG/ACT inhaler Inhale 2 puffs into the lungs in the morning and at bedtime. with spacer and rinse mouth afterwards.   ezetimibe (ZETIA) 10 MG tablet TAKE ONE-HALF TABLET BY  MOUTH DAILY   ezetimibe (ZETIA) 10 MG tablet Take 10 mg by mouth daily. Per patient Durene Cal 1/2 tablet daily   famotidine (PEPCID) 40 MG tablet Take 1 tablet by mouth at bedtime.   fluticasone (FLONASE) 50 MCG/ACT nasal spray Place 2 sprays into both nostrils daily. (Patient taking differently: Place 2 sprays into both  nostrils as needed.)   Iron, Ferrous Sulfate, 325 (65 Fe) MG TABS 1 tablet Orally Three times a Week for 30 day(s)   loratadine (CLARITIN) 10 MG tablet 1 tablet Orally Once a day   metoprolol succinate (TOPROL-XL) 25 MG 24 hr tablet Take 25 mg by mouth daily. Per patient taking 1/2 tablet twice a day   nitroGLYCERIN (NITROSTAT) 0.4 MG SL tablet Place 1 tablet (0.4 mg total) under the tongue every 5 (five) minutes x 3 doses as needed for chest pain.   pantoprazole (PROTONIX) 40 MG tablet Take 40 mg by mouth daily.   pravastatin (PRAVACHOL) 20 MG tablet TAKE 1 TABLET BY MOUTH IN  THE EVENING   Probiotic Product (ALIGN PO) Take by mouth. Per patient taking 1 tablet daily   No current facility-administered medications for this visit. (Other)      REVIEW OF SYSTEMS: ROS   Negative for: Constitutional, Gastrointestinal, Neurological, Skin, Genitourinary, Musculoskeletal, HENT, Endocrine, Cardiovascular, Eyes, Respiratory, Psychiatric, Allergic/Imm, Heme/Lymph Last edited by Hurman Horn, MD on 08/20/2022  3:25 PM.       ALLERGIES Allergies  Allergen Reactions   Nsaids Other (See Comments)    Pt is prone to bleeding   Aspirin Other (See Comments)    Other reaction(s): Other Can take 81 mg but not aspirin based medications Pt is prone to bleeding, ok with low dose coated aspirin    PAST  MEDICAL HISTORY Past Medical History:  Diagnosis Date   Anemia    Arthritis    Asthma    Barrett's esophagus    Stage B   CAD (coronary artery disease)    a. s/p STEMI 7/14 >> LHC: LM 20, mLAD 30-40, small Dx 100 (L-L collats), OM1 90, OM2 30-40, mRCA 50-60, Lat/AL akinesis, EF 35-40% >> PCI: Promus DES to OM1;  b. Myoview 3/16: diaph atten, no ischemia, EF 59% (intermediate risk b/c BP drop 152>>137, but recovery BP 162 (tech error) >> med Rx continued  c. cath 05/30/17 s/p FFR guided PCI with DES to circumflex/first OM    COVID-19 virus infection    CREST variant of scleroderma ([place])    Cystocele     Diverticulitis    Event monitor 04/2019   Event monitor 04/2019: Sinus rhythm; no atrial fibrillation/flutter; no bradycardic events; few PVCs and occasional ventricular runs up to 9 beats   GERD (gastroesophageal reflux disease)    History of blood transfusion    hx of being Anti-K positive   History of Doppler ultrasound    a. Carotid US 2/12: bilateral ICA < 50%   Hyperlipidemia    Hypertension    Ischemic cardiomyopathy    a. EF 35-40% by LHC at time of MI;  b. Echo 7/14:  Mild focal basal septal hypertrophy, EF 50-55%, apical HK, grade 1 diastolic dysfunction, trivial AI    Ischemic colitis ([place])    Orthostatic hypotension    OSA on CPAP    Osteoporosis 12/2017   T score -2.6   Raynaud disease    Rectocele    mild   Past Surgical History:  Procedure Laterality Date   ABDOMINAL HYSTERECTOMY  1996   RSO for menorrhagia.   CORONARY ANGIOPLASTY WITH STENT PLACEMENT  06/21/2013   CORONARY STENT INTERVENTION N/A 05/30/2017   Procedure: Coronary Stent Intervention;  Surgeon: [name], MD;  Location: [facility];  Service: Cardiovascular;  Laterality: N/A;   FOOT SURGERY  2013   x2   INTRAVASCULAR PRESSURE WIRE/FFR STUDY N/A 05/30/2017   Procedure: Intravascular Pressure Wire/FFR Study;  Surgeon: [name], MD;  Location: [facility];  Service: Cardiovascular;  Laterality: N/A;   JOINT REPLACEMENT  9/10   rt total   LEFT HEART CATH AND CORONARY ANGIOGRAPHY N/A 05/30/2017   Procedure: Left Heart Cath and Coronary Angiography;  Surgeon: [name], MD;  Location: [facility];  Service: Cardiovascular;  Laterality: N/A;   nissen fundoplication  1610   ROTATOR CUFF REPAIR     TONSILLECTOMY  1950   TOTAL KNEE ARTHROPLASTY     right   TUBAL LIGATION  1980    FAMILY HISTORY Family History  Problem Relation Age of Onset   Cancer Mother        Pancreatic Cancer   Hepatitis C Mother    Heart disease Father        congestive heart failure    Heart attack Father    Panic disorder Daughter    Hypertension Daughter    Asthma Maternal Grandmother    Emphysema Maternal Grandmother    Cancer Maternal Grandfather        lung   Stroke Neg Hx     SOCIAL HISTORY Social History   Tobacco Use   Smoking status: Never   Smokeless tobacco: Never  Vaping Use   Vaping Use: Never used  Substance Use Topics   Alcohol use: No    Alcohol/week:  0.0 standard drinks of alcohol   Drug use: No         OPHTHALMIC EXAM:  Base Eye Exam     Visual Acuity (ETDRS)       Right Left   Dist [place] 20/25 +3 20/20 -1         Tonometry (Tonopen, 3:30 PM)       Right Left   Pressure 15 15  Could not get pressure        Pupils       Pupils APD   Right PERRL None   Left PERRL None         Visual Fields       Left Right    Full Full         Extraocular Movement       Right Left    Full, Ortho Full, Ortho         Neuro/Psych     Oriented x3: Yes   Mood/Affect: Normal         Dilation     Both eyes: 1.0% Mydriacyl, 2.5% Phenylephrine @ 2:49 PM           Slit Lamp and Fundus Exam     External Exam       Right Left   External Normal Normal         Slit Lamp Exam       Right Left   Lids/Lashes Normal Normal   Conjunctiva/Sclera White and quiet White and quiet   Cornea Clear Clear   Anterior Chamber Deep and quiet Deep and quiet   Iris Round and reactive Round and reactive   Lens Posterior chamber intraocular lens Posterior chamber intraocular lens   Anterior Vitreous Normal Normal         Fundus Exam       Right Left   Posterior Vitreous Normal Normal   Disc Normal Normal   C/D Ratio 0.3 0.55   Macula no macular thickening no macular thickening, Microaneurysms   Vessels Normal Normal   Periphery Normal Normal            IMAGING AND PROCEDURES  Imaging and Procedures for 08/20/22  OCT, Retina - OU - Both Eyes       Right Eye Quality was good. Scan locations included  subfoveal. Central Foveal Thickness: 288. Progression has been stable. Findings include cystoid macular edema.   Left Eye Quality was good. Scan locations included subfoveal. Central Foveal Thickness: 295. Progression has been stable.   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                  ASSESSMENT/PLAN:  Cystoid macular edema of left eye Feeder OS perifoveal not progressive.  Still like might be schisis cavity  Type 2 macular telangiectasis, left Not worse with inconsistent use of CPAP due to mask issues  Type 2 macular telangiectasis, right No active CME     ICD-10-CM   1. Type 2 macular telangiectasis, right  H35.071 OCT, Retina - OU - Both Eyes    2. Cystoid macular edema of left eye  H35.352     3. Type 2 macular telangiectasis, left  H35.072       1.  2.  3.  Ophthalmic Meds Ordered this visit:  No orders of the defined types were placed in this  encounter.      Return in about 1 year (around 08/21/2023) for DILATE OU, OCT, COLOR FP.  There are no Patient Instructions on file for this visit.   Explained the diagnoses, plan, and follow up with the patient and they expressed understanding.  Patient expressed understanding of the importance of proper follow up care.   Clent Demark Kairo Laubacher M.D. Diseases & Surgery of the Retina and Vitreous [facility] 08/20/22     Abbreviations: M myopia (nearsighted); A astigmatism; H hyperopia (farsighted); P presbyopia; Mrx spectacle prescription;  CTL contact lenses; OD right eye; OS left eye; OU both eyes  XT exotropia; ET esotropia; PEK punctate epithelial keratitis; PEE punctate epithelial erosions; DES dry eye syndrome; MGD meibomian gland dysfunction; ATs artificial tears; PFAT's preservative free artificial tears; [place] nuclear sclerotic cataract; PSC  posterior subcapsular cataract; ERM epi-retinal membrane; PVD posterior vitreous detachment; RD retinal detachment; DM diabetes mellitus; DR diabetic retinopathy; NPDR non-proliferative diabetic retinopathy; PDR proliferative diabetic retinopathy; CSME clinically significant macular edema; DME diabetic macular edema; dbh dot blot hemorrhages; CWS cotton wool spot; POAG primary open angle glaucoma; C/D cup-to-disc ratio; HVF humphrey visual field; GVF goldmann visual field; OCT optical coherence tomography; IOP intraocular pressure; BRVO Branch retinal vein occlusion; CRVO central retinal vein occlusion; CRAO central retinal artery occlusion; BRAO branch retinal artery occlusion; RT retinal tear; SB scleral buckle; PPV pars plana vitrectomy; VH Vitreous hemorrhage; PRP panretinal laser photocoagulation; IVK intravitreal kenalog; VMT vitreomacular traction; MH Macular hole;  NVD neovascularization of the disc; NVE neovascularization elsewhere; AREDS age related eye disease study; ARMD age related macular degeneration; POAG primary open angle glaucoma; EBMD epithelial/anterior basement membrane dystrophy; ACIOL anterior chamber intraocular lens; IOL intraocular lens; PCIOL posterior chamber intraocular lens; Phaco/IOL phacoemulsification with intraocular lens placement; [place] photorefractive keratectomy; LASIK laser assisted in situ keratomileusis; HTN hypertension; DM diabetes mellitus; COPD chronic obstructive pulmonary disease

## 2022-08-20 NOTE — Assessment & Plan Note (Signed)
Not worse with inconsistent use of CPAP due to mask issues

## 2022-08-20 NOTE — Assessment & Plan Note (Signed)
Feeder OS perifoveal not progressive.  Still like might be schisis cavity

## 2022-08-22 ENCOUNTER — Other Ambulatory Visit: Payer: Self-pay | Admitting: Cardiovascular Disease

## 2022-09-25 NOTE — Progress Notes (Signed)
Follow Up Note  RE: Kristine [name] MRN: [id number] DOB: 1941-08-16 Date of Office Visit: 09/26/2022  Referring provider: Jennette Dubin, NP Primary care provider: Jennette Dubin, NP  Chief Complaint: Asthma (Chest xray normal. ) and Allergies (Head congestion )  History of Present Illness: I had the pleasure of seeing Kristine [name] for a follow up visit at the [facility] [place] on 09/27/2022. She is a 81 y.o. female, who is being followed for reactive airway disease, chronic rhinitis, GERD. Her previous allergy office visit was on 07/23/2022 with Dr. Maudie [name]. Today is a new complaint visit of breathing issues .  Breathing 1 month ago patient had Covid-19 for the third time. She did not take antiviral for this as she was diagnosed 8 days into her symptoms.  She then had an ear infection which was treated with doxycycline which she did not finish as it was not effective. She was also prescribed prednisone '20mg'$  daily x 5 days.   She is still having some chest and head congestion.  She had CXR done on 09/25/22 which showed no acute process.  She takes Protonix '40mg'$  daily and famotidine '40mg'$  daily for her reflux issues.   Patient started on Symbicort 68mg 1 puff once a day the past 3 weeks.  She was using duoneb which caused shaking and it was changed to ipratropium nebulizer which does not make her shake. The nebulizer seems to help.  She is also using Mucinex DM once a day.   Chronic rhinitis Only using Flonase as needed and recently started on azelastine once per day.   08/02/2022 CXR: "IMPRESSION: 1. Markedly dilated esophagus, above a hiatal hernia, similar to appearance on chest x-ray of 04/18/2020, stable to increased compared to the earlier chest CT of 12/23/2008. Findings compatible with chronic primary versus secondary achalasia. 2. No acute findings.  No evidence of pneumonia or pulmonary edema."  Assessment and Plan: Kristine [name] a 81y.o. female  with: Upper respiratory tract infection Treated with ear infection but still having some chest and head congestion. 09/25/22 CXR showed no acute process. Had Covid-19 1 month ago for the third time.  Start prednisone taper. Prednisone '10mg'$  tablets - take 2 tablets for 4 days then 1 tablet on day 5.  Take Azithromycin '250mg'$  2 tablets on day 1, then 1 tablet on days 2-5.  Take Mucinex 1-2 times per day with plenty of water to break up the mucous. See below for symptomatic treatment.  Mild intermittent reactive airway disease Past history - Noted issues with her breathing for the last 1 year [facility]  Required 2 courses of prednisone to date.  Using albuterol 3-4 times per week with good benefit still. Concerned about mold allergies. Had COVID-19 twice. Saw pulmonology in the past due to cough and CREST syndrome. Also has GERD and CAD. 2023 spirometry showed: possible restrictive disease with 7% improvement in FEV1 post bronchodilator treatment. Clinically feeling unchanged.  Interim history - had Covid-19 again and ear infection, finished round of doxycycline and prednisone but still not back to baseline. Duoneb caused shaking. During breathing flares: Use nebulizer machine in the mornings and at night before using Symbicort for the next 1 week. Start Symbicort 83m 2 puffs twice a day with spacer and rinse mouth afterwards for 1-2 weeks until your breathing symptoms return to baseline.  May use albuterol rescue inhaler 1-2 puffs every 4 to 6 hours or nebulizer as needed for shortness of  breath, chest tightness, coughing, and wheezing. Monitor frequency of use.  Get spirometry at next visit.  Chronic rhinitis Past history - Rhinoconjunctivitis symptoms mainly in the spring for the last 15+ years.  Tried Claritin and Flonase with good benefit.  No prior 2023 skin testing showed: Negative to indoor/outdoor allergens.  Interim history - having drainage and recently  started on azelastine.  Use Flonase (fluticasone) nasal spray 1 spray per nostril twice a day as needed for nasal congestion.  Use azelastine nasal spray 1-2 sprays per nostril twice a day as needed for runny nose/drainage. Hold antihistamines as complaining of mucosal dryness.   GERD (gastroesophageal reflux disease) Keep GI appointment in December.  Continue lifestyle and dietary modifications. Continue pantoprazole and famotidine daily as before.   Return in about 3 months (around 12/27/2022).  Meds ordered this encounter  Medications   azithromycin (ZITHROMAX Z-PAK) 250 MG tablet    Sig: Take 2 tablets on day 1, then 1 tablet on days 2-5.    Dispense:  6 each    Refill:  0   Lab Orders  No laboratory test(s) ordered today    Diagnostics: None.   Medication List:  Current Outpatient Medications  Medication Sig Dispense Refill   acetaminophen (TYLENOL) 500 MG tablet Take 1,000 mg by mouth every 6 (six) hours as needed for headache.     albuterol (VENTOLIN HFA) 108 (90 Base) MCG/ACT inhaler 1 puff as needed Inhalation every 4 hrs     ALPRAZolam (XANAX) 0.25 MG tablet Take 1 tablet (0.25 mg total) by mouth daily as needed for anxiety. 30 tablet 0   aspirin EC 81 MG EC tablet Take 1 tablet (81 mg total) by mouth daily.     Azelastine HCl 137 MCG/SPRAY SOLN 1 puff in each nostril Nasally Twice a day for 30 days     azithromycin (ZITHROMAX Z-PAK) 250 MG tablet Take 2 tablets on day 1, then 1 tablet on days 2-5. 6 each 0   budesonide-formoterol (SYMBICORT) 80-4.5 MCG/ACT inhaler Inhale 2 puffs into the lungs in the morning and at bedtime. with spacer and rinse mouth afterwards. 1 each 3   famotidine (PEPCID) 40 MG tablet Take 1 tablet by mouth at bedtime.     ipratropium (ATROVENT) 0.02 % nebulizer solution 2.5 mL Inhalation 6 hours prn for 30 days     metoprolol succinate (TOPROL-XL) 25 MG 24 hr tablet Take 25 mg by mouth daily. Per patient taking 1/2 tablet twice a day      nitroGLYCERIN (NITROSTAT) 0.4 MG SL tablet Place 1 tablet (0.4 mg total) under the tongue every 5 (five) minutes x 3 doses as needed for chest pain. 75 tablet 1   pantoprazole (PROTONIX) 40 MG tablet Take 40 mg by mouth daily.     pravastatin (PRAVACHOL) 20 MG tablet TAKE 1 TABLET BY MOUTH IN  THE EVENING 90 tablet 3   Probiotic Product (ALIGN PO) Take by mouth. Per patient taking 1 tablet daily     No current facility-administered medications for this visit.   Allergies: Allergies  Allergen Reactions   Nsaids Other (See Comments)    Pt is prone to bleeding   Aspirin Other (See Comments)    Other reaction(s): Other Can take 81 mg but not aspirin based medications Pt is prone to bleeding, ok with low dose coated aspirin   I reviewed her past medical history, social history, family history, and environmental history and no significant changes have been reported from her previous visit.  Review of Systems  Constitutional:  Negative for appetite change, chills, fever and unexpected weight change.  HENT:  Positive for congestion.   Eyes:  Negative for itching.  Respiratory:  Positive for cough. Negative for chest tightness, shortness of breath and wheezing.   Cardiovascular:  Negative for chest pain.  Gastrointestinal:  Negative for abdominal pain.  Genitourinary:  Negative for difficulty urinating.  Skin:  Negative for rash.  Allergic/Immunologic: Negative for environmental allergies.  Neurological:  Negative for headaches.    Objective: BP 118/66   Pulse 82   Temp 97.7 F (36.5 C) (Temporal)   Resp 16   LMP 11/25/1994   SpO2 100%  There is no height or weight on file to calculate BMI. Physical Exam Vitals and nursing note reviewed.  Constitutional:      Appearance: Normal appearance. She is well-developed.  HENT:     Head: Normocephalic and atraumatic.     Right Ear: Tympanic membrane and external ear normal.     Left Ear: Tympanic membrane and external ear normal.      Nose: Nose normal.     Mouth/Throat:     Mouth: Mucous membranes are moist.     Pharynx: Oropharynx is clear.  Eyes:     Conjunctiva/sclera: Conjunctivae normal.  Cardiovascular:     Rate and Rhythm: Normal rate and regular rhythm.     Heart sounds: Normal heart sounds. No murmur heard.    No friction rub. No gallop.  Pulmonary:     Effort: Pulmonary effort is normal.     Breath sounds: Normal breath sounds. No wheezing, rhonchi or rales.  Musculoskeletal:     Cervical back: Neck supple.  Skin:    General: Skin is warm.     Findings: No rash.  Neurological:     Mental Status: She is alert and oriented to person, place, and time.  Psychiatric:        Behavior: Behavior normal.    Previous notes and tests were reviewed. The plan was reviewed with the patient/family, and all questions/concerned were addressed.  It was my pleasure to see Kristine [name] today and participate in her care. Please feel free to contact me with any questions or concerns.  Sincerely,  Rexene Alberts, DO Allergy & Immunology  [facility] office: [place] office: [phone]

## 2022-09-26 ENCOUNTER — Encounter: Payer: Self-pay | Admitting: Allergy

## 2022-09-26 ENCOUNTER — Ambulatory Visit (INDEPENDENT_AMBULATORY_CARE_PROVIDER_SITE_OTHER): Payer: Medicare Other | Admitting: Allergy

## 2022-09-26 VITALS — BP 118/66 | HR 82 | Temp 97.7°F | Resp 16

## 2022-09-26 DIAGNOSIS — J4520 Mild intermittent asthma, uncomplicated: Secondary | ICD-10-CM

## 2022-09-26 DIAGNOSIS — J310 Chronic rhinitis: Secondary | ICD-10-CM

## 2022-09-26 DIAGNOSIS — K219 Gastro-esophageal reflux disease without esophagitis: Secondary | ICD-10-CM

## 2022-09-26 DIAGNOSIS — J069 Acute upper respiratory infection, unspecified: Secondary | ICD-10-CM | POA: Diagnosis not present

## 2022-09-26 MED ORDER — AZITHROMYCIN 250 MG PO TABS: ORAL_TABLET | ORAL | 0 refills | Status: DC

## 2022-09-26 NOTE — Patient Instructions (Addendum)
Breathing Start prednisone taper. Prednisone '10mg'$  tablets - take 2 tablets for 4 days then 1 tablet on day 5.  Take Azithromycin '250mg'$  2 tablets on day 1, then 1 tablet on days 2-5.  Take Mucinex 1-2 times per day with plenty of water to break up the mucous. See below for symptomatic treatment.   During breathing flares: Use nebulizer machine in the mornings and at night before using Symbicort for the next 1 week. Start Symbicort 29mg 2 puffs twice a day with spacer and rinse mouth afterwards for 1-2 weeks until your breathing symptoms return to baseline.  May use albuterol rescue inhaler 1-2 puffs every 4 to 6 hours or nebulizer as needed for shortness of breath, chest tightness, coughing, and wheezing. Monitor frequency of use.  Breathing control goals:  Full participation in all desired activities (may need albuterol before activity) Albuterol use two times or less a week on average (not counting use with activity) Cough interfering with sleep two times or less a month Oral steroids no more than once a year No hospitalizations   Rhinitis:  2023 skin testing was negative to indoor/outdoor allergens.  Use Flonase (fluticasone) nasal spray 1 spray per nostril twice a day as needed for nasal congestion.  Use azelastine nasal spray 1-2 sprays per nostril twice a day as needed for runny nose/drainage.  Heartburn: Keep GI appointment in December.  Continue lifestyle and dietary modifications. Continue pantoprazole and famotidine daily as before.   Follow up in 3 months or sooner if needed.    Drink plenty of fluids. Water, juice, clear broth or warm lemon water are good choices. Avoid caffeine and alcohol, which can dehydrate you. Eat chicken soup. Chicken soup and other warm fluids can be soothing and loosen congestion. Rest. Adjust your room's temperature and humidity. Keep your room warm but not overheated. If the air is dry, a cool-mist humidifier or vaporizer can moisten the air  and help ease congestion and coughing. Keep the humidifier clean to prevent the growth of bacteria and molds. Soothe your throat. Perform a saltwater gargle. Dissolve one-quarter to a half teaspoon of salt in a 4- to 8-ounce glass of warm water. This can relieve a sore or scratchy throat temporarily. Use saline nasal drops. To help relieve nasal congestion, try saline nasal drops. You can buy these drops over the counter, and they can help relieve symptoms ? even in children. Take over-the-counter cold and cough medications. For adults and children older than 5, over-the-counter decongestants, antihistamines and pain relievers might offer some symptom relief. However, they won't prevent a cold or shorten its duration.

## 2022-09-27 ENCOUNTER — Encounter: Payer: Self-pay | Admitting: Allergy

## 2022-09-27 DIAGNOSIS — J069 Acute upper respiratory infection, unspecified: Secondary | ICD-10-CM | POA: Insufficient documentation

## 2022-09-27 NOTE — Assessment & Plan Note (Signed)
Past history - Noted issues with her breathing for the last 1 year especially when going to [facility].  Required 2 courses of prednisone to date.  Using albuterol 3-4 times per week with good benefit still. Concerned about mold allergies. Had COVID-19 twice. Saw pulmonology in the past due to cough and CREST syndrome. Also has GERD and CAD. 2023 spirometry showed: possible restrictive disease with 7% improvement in FEV1 post bronchodilator treatment. Clinically feeling unchanged.  Interim history - had Covid-19 again and ear infection, finished round of doxycycline and prednisone but still not back to baseline. Duoneb caused shaking. During breathing flares: Use nebulizer machine in the mornings and at night before using Symbicort for the next 1 week. Start Symbicort 11mg 2 puffs twice a day with spacer and rinse mouth afterwards for 1-2 weeks until your breathing symptoms return to baseline.  May use albuterol rescue inhaler 1-2 puffs every 4 to 6 hours or nebulizer as needed for shortness of breath, chest tightness, coughing, and wheezing. Monitor frequency of use.  Get spirometry at next visit.

## 2022-09-27 NOTE — Assessment & Plan Note (Signed)
Treated with ear infection but still having some chest and head congestion. 09/25/22 CXR showed no acute process. Had Covid-19 1 month ago for the third time.  Start prednisone taper. Prednisone '10mg'$  tablets - take 2 tablets for 4 days then 1 tablet on day 5.  Take Azithromycin '250mg'$  2 tablets on day 1, then 1 tablet on days 2-5.  Take Mucinex 1-2 times per day with plenty of water to break up the mucous. See below for symptomatic treatment.

## 2022-09-27 NOTE — Assessment & Plan Note (Signed)
Keep GI appointment in December.  Continue lifestyle and dietary modifications. Continue pantoprazole and famotidine daily as before.

## 2022-09-27 NOTE — Assessment & Plan Note (Signed)
Past history - Rhinoconjunctivitis symptoms mainly in the spring for the last 15+ years.  Tried Claritin and Flonase with good benefit.  No prior 2023 skin testing showed: Negative to indoor/outdoor allergens.  Interim history - having drainage and recently started on azelastine.  Use Flonase (fluticasone) nasal spray 1 spray per nostril twice a day as needed for nasal congestion.  Use azelastine nasal spray 1-2 sprays per nostril twice a day as needed for runny nose/drainage. Hold antihistamines as complaining of mucosal dryness.

## 2022-10-10 ENCOUNTER — Other Ambulatory Visit: Payer: Self-pay

## 2022-10-10 MED ORDER — METOPROLOL SUCCINATE ER 25 MG PO TB24: 25.0000 mg | ORAL_TABLET | Freq: Every day | ORAL | 0 refills | Status: DC

## 2022-10-10 MED ORDER — METOPROLOL SUCCINATE ER 25 MG PO TB24: 25.0000 mg | ORAL_TABLET | Freq: Every day | ORAL | 1 refills | Status: DC

## 2022-10-10 NOTE — Addendum Note (Signed)
Addended by: Carter Kitten D on: 10/10/2022 03:47 PM   Modules accepted: Orders

## 2022-10-10 NOTE — Telephone Encounter (Signed)
Pt's medication was sent to pt's pharmacy as requested. Confirmation received.  °

## 2022-10-24 ENCOUNTER — Ambulatory Visit: Payer: Medicare Other | Admitting: Allergy

## 2022-11-15 ENCOUNTER — Telehealth: Payer: Self-pay | Admitting: Cardiovascular Disease

## 2022-11-15 NOTE — Telephone Encounter (Addendum)
Spoke with pt who complains of intermittent dull, achy, pain under her left breast in her "top rib bone" without radiation.  Pt states discomfort started last night before bed.  She has not tried taking any Nitro or Tylenol.  BP 138/73.  Pt states she went to bed and slept well all night.  Pt woke this morning with the same discomfort.  BP - 143/86 HR 73.  Pt has not eaten or taken any medications this morning.  She denies, SOB, dizziness, diaphoresis and N&V.  She rates pain as a 4-5 when she has it.  She is not aware of any falls or injuries to that area. Pt advised with the location of her pain most likely not cardiac in nature.  Encouraged pt to contact her PCP office for further evaluation.  Pt advised to contact office if Sx increase or she develops new Sx.  Provided education on how to take [facility] as well as ED precautions given.  Pt verbalizes understanding and agrees with current plan.

## 2022-11-15 NOTE — Telephone Encounter (Signed)
Pt c/o of Chest Pain: STAT if CP now or developed within 24 hours  1. Are you having CP right now?   Yes  2. Are you experiencing any other symptoms (ex. SOB, nausea, vomiting, sweating)?   No  3. How long have you been experiencing CP?  Since last evening around 8 pm  4. Is your CP continuous or coming and going?   Coming and going  5. Have you taken Nitroglycerin?  No  Patient stated she started having a dull achy pain in her left chest in bone under her breast.  Patient stated last night BP was 138/73.  On phone BP was 143/86  HR 73.?

## 2022-12-31 ENCOUNTER — Ambulatory Visit: Payer: Medicare Other | Admitting: Allergy

## 2023-01-09 ENCOUNTER — Ambulatory Visit: Payer: Medicare Other | Admitting: Allergy

## 2023-01-28 ENCOUNTER — Ambulatory Visit: Payer: Medicare Other | Admitting: Allergy

## 2023-02-06 ENCOUNTER — Ambulatory Visit: Payer: Medicare Other | Admitting: Allergy

## 2023-03-19 ENCOUNTER — Telehealth: Payer: Self-pay | Admitting: Cardiovascular Disease

## 2023-03-19 MED ORDER — PRAVASTATIN SODIUM 20 MG PO TABS: 20.0000 mg | ORAL_TABLET | Freq: Every evening | ORAL | 0 refills | Status: DC

## 2023-03-19 NOTE — Telephone Encounter (Signed)
Pt's medication was sent to pt's pharmacy as requested. Confirmation received.  °

## 2023-03-19 NOTE — Telephone Encounter (Signed)
*  STAT* If patient is at the pharmacy, call can be transferred to refill team.   1. Which medications need to be refilled? (please list name of each medication and dose if known)  metoprolol succinate (TOPROL-XL) 25 MG 24 hr tablet pravastatin (PRAVACHOL) 20 MG tablet   2. Which pharmacy/location (including street and city if local pharmacy) is medication to be sent to?[facility] [id number] - [place], [place] - [address]  3. Do they need a 30 day or 90 day supply? 90 day supply   Pt is going to call next week to get scheduled with Dr. Excell Seltzer once his October schedule is released

## 2023-03-19 NOTE — Addendum Note (Signed)
Addended by: Margaret Pyle D on: 03/19/2023 03:24 PM   Modules accepted: Orders

## 2023-04-03 ENCOUNTER — Telehealth: Payer: Self-pay | Admitting: Cardiovascular Disease

## 2023-04-03 MED ORDER — METOPROLOL SUCCINATE ER 25 MG PO TB24: 25.0000 mg | ORAL_TABLET | Freq: Every day | ORAL | 0 refills | Status: DC

## 2023-04-03 NOTE — Telephone Encounter (Signed)
Pt's medication was sent to pt's pharmacy as requested. Confirmation received.  °

## 2023-04-03 NOTE — Telephone Encounter (Signed)
*  STAT* If patient is at the pharmacy, call can be transferred to refill team.   1. Which medications need to be refilled? (please list name of each medication and dose if known)  metoprolol succinate (TOPROL-XL) 25 MG 24 hr tablet   2. Which pharmacy/location (including street and city if local pharmacy) is medication to be sent to? [facility] [id number] - [place], [place] - [address]   3. Do they need a 30 day or 90 day supply? 90 day  Patient has appt on 06/11/23. Patient is out of medication.

## 2023-06-10 NOTE — Progress Notes (Unsigned)
Cardiology Office Note:    Date:  06/11/2023  ID:  Kristine [name], DOB [birth year], MRN [id number] PCP: Nona Dell, NP  [facility] Providers Cardiologist:  Tonny Bollman, MD       Patient Profile:      Coronary artery disease  S/p Lat STEMI in 05/2013 tx w 2.75 x 16 mm DES to OM1 S/p 3.5 x 16 mm DES to LCx involving prox edge of prior stent in 05/2017 LHC 05/30/17: pLAD 30, pLCx 70, OM1 stent patent, mRCA 40   Ischemic CM 2014 EF: 35-40 >> improved to normal  TTE 05/2013: Mild focal basal septal hypertrophy, EF 50-55%, apical HK, grade 1 diastolic dysfunction, trivial AI  Hypertension  Hyperlipidemia  CREST syndrome  Bronchiectasis Monitor 04/2019: No AFib, few PVCs Carotid US 02/02/2019: no ICA stenosis       History of Present Illness:   Discussed the use of AI scribe software for clinical note transcription with the patient, who gave verbal consent to proceed.   History of Present Illness   The patient, an 82 year old with a history of coronary artery disease, hyperlipidemia, and prediabetes, presents for a yearly follow-up and metoprolol refill. She reports occasional lightheadedness and dizziness, which she manages by sitting down and resting. These episodes have been occurring about once a month. She also reports a decrease in balance, with two falls in the past six months.  The patient has been experiencing high blood pressure readings, with one instance of 163/80, which was managed with apple cider vinegar and water, reducing it to 115. However, she reports no other high readings. She denies any chest pain, pressure, or shortness of breath, except when in [facility], where she experiences asthma attacks and uses a nebulizer and inhalers.  The patient has sleep apnea and is working with her dentist to create a mouthpiece as an alternative to a CPAP machine, which her dog tends to chew. She has lost significant weight, going from 138 to 113 pounds, by walking two miles  a day with her dog. She reports no bleeding, black stools, bloody stools, or bloody urine. She denies smoking and drinking.     ROS: See HPI    Studies Reviewed:   EKG Interpretation Date/Time:  Wednesday June 11 2023 11:47:13 EDT Ventricular Rate:  77 PR Interval:  144 QRS Duration:  92 QT Interval:  402 QTC Calculation: 454 R Axis:   17  Text Interpretation: Sinus rhythm with Premature atrial complexes Normal axis Incomplete right bundle branch block No significant change compared to last ECG Confirmed by Tereso Newcomer [phone]) on 06/11/2023 11:52:34 AM   Risk Assessment/Calculations:             Physical Exam:   VS:  BP 124/64   Pulse 77   Ht 5' (1.524 m)   Wt 113 lb 12.8 oz (51.6 kg)   LMP 11/25/1994   SpO2 93%   BMI 22.23 kg/m    Wt Readings from Last 3 Encounters:  06/11/23 113 lb 12.8 oz (51.6 kg)  07/23/22 122 lb 8 oz (55.6 kg)  04/08/22 120 lb 9.6 oz (54.7 kg)    Physical Exam   GENERAL: Well developed female, no acute distress. NECK: No jugular venous distention. CHEST: Lungs clear to auscultation, bilaterally no rales. CARDIOVASCULAR: Normal S1 S2, regular rhythm, no murmur. EXTREMITIES: No edema.         ASSESSMENT AND PLAN:   CAD (coronary artery disease), native coronary artery No chest pain or angina.  LDL above goal (74, goal <55). Currently on Pravastatin 20mg  daily and Aspirin 81mg  daily. -Request cholesterol labs from primary care provider. -Continue Pravastatin 20mg  daily and Aspirin 81mg  daily. -Refill NTG prn -F/u 1 year  Hypertension Recent episode of high blood pressure (163/80) self-managed with home remedies. Today's blood pressure 124/64. Currently on Metoprolol. -Check blood pressure at home for two weeks and report readings. -Consider reducing Metoprolol dose based on home blood pressure readings.  OSA on CPAP Currently managed with CPAP but she is trying to get a dental device. -Continue current management.  Hyperlipidemia She  will get labs soon with primary care. I have asked her to request labs be sent to [place]. Continue Pravastatin 20 mg once daily. If LDL > 55, consider increasing to 40 mg once daily.     Dispo:  Return in about 1 year (around 06/10/2024) for Routine Follow Up, w/ Dr. Excell Seltzer, or Tereso Newcomer, PA-C.  Signed, Tereso Newcomer, PA-C

## 2023-06-11 ENCOUNTER — Ambulatory Visit: Payer: Medicare Other | Attending: Physician Assistant | Admitting: Physician Assistant

## 2023-06-11 ENCOUNTER — Encounter: Payer: Self-pay | Admitting: Physician Assistant

## 2023-06-11 VITALS — BP 124/64 | HR 77 | Ht 60.0 in | Wt 113.8 lb

## 2023-06-11 DIAGNOSIS — G4733 Obstructive sleep apnea (adult) (pediatric): Secondary | ICD-10-CM | POA: Diagnosis not present

## 2023-06-11 DIAGNOSIS — E7800 Pure hypercholesterolemia, unspecified: Secondary | ICD-10-CM | POA: Diagnosis not present

## 2023-06-11 DIAGNOSIS — I10 Essential (primary) hypertension: Secondary | ICD-10-CM | POA: Diagnosis not present

## 2023-06-11 DIAGNOSIS — I2510 Atherosclerotic heart disease of native coronary artery without angina pectoris: Secondary | ICD-10-CM

## 2023-06-11 MED ORDER — NITROGLYCERIN 0.4 MG SL SUBL
0.4000 mg | SUBLINGUAL_TABLET | SUBLINGUAL | 3 refills | Status: AC | PRN
Start: 2023-06-11 — End: ?

## 2023-06-11 NOTE — Assessment & Plan Note (Signed)
Recent episode of high blood pressure (163/80) self-managed with home remedies. Today's blood pressure 124/64. Currently on Metoprolol. -Check blood pressure at home for two weeks and report readings. -Consider reducing Metoprolol dose based on home blood pressure readings.

## 2023-06-11 NOTE — Assessment & Plan Note (Signed)
No chest pain or angina. LDL above goal (74, goal <55). Currently on Pravastatin 20mg  daily and Aspirin 81mg  daily. -Request cholesterol labs from primary care provider. -Continue Pravastatin 20mg  daily and Aspirin 81mg  daily. -Refill NTG prn -F/u 1 year

## 2023-06-11 NOTE — Assessment & Plan Note (Signed)
Currently managed with CPAP but she is trying to get a dental device. -Continue current management.

## 2023-06-11 NOTE — Patient Instructions (Addendum)
Medication Instructions:  Your physician recommends that you continue on your current medications as directed. Please refer to the Current Medication list given to you today.  *If you need a refill on your cardiac medications before your next appointment, please call your pharmacy*   Lab Work: None ordered  If you have labs (blood work) drawn today and your tests are completely normal, you will receive your results only by: MyChart Message (if you have MyChart) OR A paper copy in the mail If you have any lab test that is abnormal or we need to change your treatment, we will call you to review the results.   Testing/Procedures: None ordered   Follow-Up: At [facility], you and your health needs are our priority.  As part of our continuing mission to provide you with exceptional heart care, we have created designated Provider Care Teams.  These Care Teams include your primary Cardiologist (physician) and Advanced Practice Providers (APPs -  Physician Assistants and Nurse Practitioners) who all work together to provide you with the care you need, when you need it.  We recommend signing up for the patient portal called "MyChart".  Sign up information is provided on this After Visit Summary.  MyChart is used to connect with patients for Virtual Visits (Telemedicine).  Patients are able to view lab/test results, encounter notes, upcoming appointments, etc.  Non-urgent messages can be sent to your provider as well.   To learn more about what you can do with MyChart, go to [email or web address].    Your next appointment:   1 year(s)  Provider:   Tonny Bollman, MD  or Tereso Newcomer, PA-C         Other Instructions Your Mychart Information:  Username:  [email or web address] (THE NUMBER [email or web address]) Password:  [email or web address](THE NUMBER [email or web address])  Your physician has requested that you regularly monitor and record your blood pressure readings at home. Please use the same machine at the same time of day to  check your readings and record them to bring to your follow-up visit.   Please monitor blood pressures and keep a log of your readings for 2 weeks then send me the readings via mychart.     Make sure to check 2 hours after your medications.    AVOID these things for 30 minutes before checking your blood pressure: No Drinking caffeine. No Drinking alcohol. No Eating. No Smoking. No Exercising.   Five minutes before checking your blood pressure: Pee. Sit in a dining chair. Avoid sitting in a soft couch or armchair. Be quiet. Do not talk

## 2023-06-11 NOTE — Assessment & Plan Note (Signed)
She will get labs soon with primary care. I have asked her to request labs be sent to [place]. Continue Pravastatin 20 mg once daily. If LDL > 55, consider increasing to 40 mg once daily.

## 2023-07-23 ENCOUNTER — Other Ambulatory Visit: Payer: Self-pay | Admitting: Cardiovascular Disease

## 2023-07-25 ENCOUNTER — Telehealth: Payer: Self-pay | Admitting: Cardiovascular Disease

## 2023-07-25 NOTE — Telephone Encounter (Signed)
Kristine [name] from [employer] called and said the patient has been experiencing dizzy spells and falls. Also is not eating due to no appetite and is getting frail. BP and HR was 120/62; 84

## 2023-07-25 NOTE — Telephone Encounter (Signed)
Pt last seen in clinic by Scott on 06/11/23 with BP 124/64, HR 77. No concerns at that visit. States she is eating and has been eating well, but has cut her carbs since her PCP told her that her A1C was 6.0. She admits to still eating some sweets.Called and spoke with patient . States three weeks ago while at [facility], she went to a luau and attempted to go up on a stage and dance in the luau and lost her balance and fell when climbing up the steps. Denies hitting her head or getting hurt in any way. She does state that she "at times" wakes up lightheaded, so she has tried to increase water intake (bought a 64 ounce water bottle) to try and consume daily. She denies any symptoms, states she doesn't feel concerned with any aspect of her health at this time. Advised that she call if any concerns.

## 2023-07-30 ENCOUNTER — Other Ambulatory Visit: Payer: Self-pay | Admitting: Cardiovascular Disease

## 2023-07-30 ENCOUNTER — Telehealth: Payer: Self-pay | Admitting: Physician Assistant

## 2023-07-30 MED ORDER — METOPROLOL SUCCINATE ER 25 MG PO TB24: 12.5000 mg | ORAL_TABLET | Freq: Every day | ORAL | 3 refills | Status: DC

## 2023-07-30 NOTE — Telephone Encounter (Signed)
Ok to reduce Metoprolol succinate to 12.5 mg once daily. Monitor blood pressure. Call if systolic blood pressure > 145 three or more times in a row. Tereso Newcomer, PA-C    07/30/2023 4:55 PM

## 2023-07-30 NOTE — Telephone Encounter (Signed)
Patient notified. She is aware to split 25 mg tablets in half.  New prescription sent to [facility] [address]

## 2023-07-30 NOTE — Telephone Encounter (Signed)
Pt c/o medication issue:  1. Name of Medication: Metoprolol  2. How are you currently taking this medication (dosage and times per day)?  1 tablet per day  3. Are you having a reaction (difficulty breathing--STAT)?   4. What is your medication issue? Patient wants to know if she can reduce her dose to a 1/2 tablet per day? She says sometimes she feels lightheaded.She is having quite a few falls lately. Patient thinks her, blood pressure is running low

## 2023-07-30 NOTE — Telephone Encounter (Signed)
Called pt in regards to metoprolol dose.  Reports wakes up feeling lightheaded.  Has had 2 falls recently.  Falls pt mentioned  r/t to losing balance pt reports d/t feeling lightheaded. BP today 120/70 sometimes BP goes as high as 130.  Pt does not check BP when feels light headed.  Reports PCP sent Alben Spittle, PA her BP readings.  Advised I do not see record but will send message to provider to review.

## 2023-07-31 NOTE — Telephone Encounter (Signed)
Pt c/o BP issue: STAT if pt c/o blurred vision, one-sided weakness or slurred speech  1. What are your last 5 BP readings?   162/82  HR 75  2. Are you having any other symptoms (ex. Dizziness, headache, blurred vision, passed out)?  No  3. What is your BP issue?    Patient stated she had a bad fall 3 days ago and is very sore.  Patient stated her BP today is 162/82 and wants to know next steps.

## 2023-07-31 NOTE — Telephone Encounter (Signed)
Spoke with patient, she has recently had a fall from tripping over her dog (seen in ER 07/29/23) and is really sore from that fall. Patient states her BP was elevated for 1 reading (162/82) this morning but also really uncomfortable with soreness from recent fall, pain being treated with tylenol. Patient instructed to check her blood pressure 1-2 hours after taking metoprolol and keep a record for 3-5 days, inform office for 3 or more days of systolic being >145 per Tereso Newcomer. Patient thankful for quick response, no other needs at this time

## 2023-08-25 ENCOUNTER — Encounter (INDEPENDENT_AMBULATORY_CARE_PROVIDER_SITE_OTHER): Payer: Medicare Other | Admitting: Ophthalmology

## 2023-10-10 ENCOUNTER — Telehealth: Payer: Self-pay | Admitting: Physician Assistant

## 2023-10-10 NOTE — Telephone Encounter (Signed)
STAT if patient feels like he/she is going to faint   1. Are you feeling dizzy, lightheaded, or faint right now? Not at this time- past few weeks had episodes    2. Have you passed out?   (If yes move to .SYNCOPECHMG) 3 weeks ago   3. Do you have any other symptoms?    4. Have you checked your HR and BP (record if available)?      .SYNCOPECHMG   Pt c/o Syncope: STAT if syncope occurred within 24 hours and pt complains of lightheadedness.   High Priority if episode of passing out, completely, today or in last 24 hours   1. Did you pass out today? no   2. When is the last time you passed out? 09-22-23   3. Has this occurred multiple times?  Only 1 time  4. Did you have any symptoms prior to passing out? Felt really hot, really lightheaded , high pulse rate  5. Did you fall? If so, are you on a blood thinner? Passed out- not on a blood thinner- patient wanted to be seen- I made her an appointment-for Monday with Luther Parody

## 2023-10-10 NOTE — Telephone Encounter (Signed)
Spoke with patient and she stated she passed out on 09/11/23. She stated she was outside and she broke out in a sweat. She felt light headed so she leaned on the outside of her door and slide down. She is not sure how long she was out. She states she can feel the palpitations. Currently asymptomatic. Has appointment scheduled for Monday. ED precautions discussed. Below is the only readings she was able to give from last week.  BP 153/76 hr 101 165/77 hr 97 144/68 hr 101

## 2023-10-12 NOTE — Progress Notes (Unsigned)
Cardiology Office Note:  .   Date:  10/13/2023  ID:  Kristine [name], DOB [birth year], MRN [id number] PCP: Nona Dell, NP  [facility] Providers Cardiologist:  Tonny Bollman, MD    History of Present Illness: .   Kristine [name] is a 82 y.o. female with hx of CAD (05/2013 lateral STEMI DES-OM1 ? 05/2017 DES-LCx), ICM (2014 LVEF 35-40% ? improved to 50-55%), HTN, HLD, CREST syndrome, bronchiectasis, PVC, OSA.  Last seen 06/11/23 doing well with no anginal symptoms. Occasional elevated BP at home but controlled in clinic, recommended for home monitoring.   She had mechanical fall 07/29/23 after tripping over her dog with no significant injuries by ED evaluation.   Contacted the office noting syncopal spell 09/11/23 after working outdoors. Also noted palpitations.   Discussed the use of AI scribe software for clinical note transcription with the patient, who gave verbal consent to proceed.   The patient, with a history of syncope, lightheadedness, CAD, HLD, HTN, PVC, and OSA, presents with ongoing episodes of lightheadedness. The patient describes an episode three weeks ago where she felt faint and subsequently lost consciousness for an unknown duration. This occurred in the setting of walking out of her home early one morning. Since then, the patient reports persistent lightheadedness, particularly in the early and late evening. The patient also reports inconsistent blood pressure readings at home, with both high and low readings noted. The patient has been taking metoprolol intermittently, only when blood pressure readings are high. Eats regular meals. Does note her family often encourages her to drink more water (drinks one cup of coffee, mild, and green tea - does not like water). The patient also reports a recent fall 07/2023 resulting in a fractured vertebrae, for which she is currently wearing a back brace. The patient is also borderline prediabetic with a recent HbA1c of 6.0. Requests  recheck today.       Orthostatic VS for the past 72 hrs (Last 3 readings):  Orthostatic BP Patient Position BP Location Cuff Size Orthostatic Pulse  10/13/23 0849 128/62 Standing -- -- 90  10/13/23 0848 128/61 Standing -- -- 105  10/13/23 0847 116/62 Sitting -- -- 95  10/13/23 0846 118/60 Supine -- -- 82  10/13/23 0827 -- Sitting Right Arm Normal --    ROS: Please see the history of present illness.    All other systems reviewed and are negative.   Studies Reviewed: Marland Kitchen   EKG Interpretation Date/Time:  Monday October 13 2023 08:39:21 EST Ventricular Rate:  109 PR Interval:  128 QRS Duration:  86 QT Interval:  344 QTC Calculation: 463 R Axis:   -28  Text Interpretation: Sinus tachycardia with Premature atrial complexes  No acute ST/T wave changes. Confirmed by Gillian Shields ([id number]) on 10/13/2023 8:54:57 AM    Cardiac Studies & Procedures   CARDIAC CATHETERIZATION  CARDIAC CATHETERIZATION 05/30/2017  Narrative 1. Single-vessel coronary artery disease with moderately severe proximal left circumflex stenosis at the proximal edge of the previously implanted stent 2. Mild nonobstructive LAD and RCA stenoses 3. Normal LV systolic function based on noninvasive assessment 4. Successful FFR guided PCI with a 3.5 x 16 mm Promus DES in the proximal circumflex/first OM  Dual antiplatelet therapy with aspirin and Plavix for a minimum of 6 months  Findings Coronary Findings Diagnostic  Dominance: Right  Left Anterior Descending diffuse nonobstructive plaquing  Left Circumflex lesion located at the proximal stent edge, eccentric lesion appears tighter than 70% in some views.  First [employer]  [place] Previously placed Ost 1st Mrg to 1st Mrg drug eluting stent is widely patent.  Right Coronary Artery unchanged from previous study  Intervention  Prox Cx lesion Angioplasty Lesion crossed with guidewire. Pre-stent angioplasty was performed. A STENT PROMUS PREM MR 3.5X16  drug eluting stent was successfully placed, and overlaps previously placed stent. Post-stent angioplasty was not performed. The pre-interventional distal flow is normal (TIMI 3).  The post-interventional distal flow is normal (TIMI 3). No complications occurred at this lesion. This stent is implanted in overlapping fashion into the proximal edge of the previously implanted OM1 stent. The stent extends from the proximal circumflex and of the OM1 stent. There is a 0% residual stenosis post intervention.   STRESS TESTS  ECHOCARDIOGRAM STRESS TEST 05/20/2017  Narrative *[facility]* [address] [place], [place] [address] [phone]  ------------------------------------------------------------------- Stress Echocardiography  Patient:    Dorian, Cutts MR #:       [id number] Study Date: 05/20/2017 Gender:     F Age:        40 Height:     154.9 cm Weight:     60.5 kg BSA:        1.63 m^2 Pt. Status: Room:  SONOGRAPHER  Randa Evens, Will Doreene Adas, Scott Royston Bake T ATTENDING    Marca Ancona, M.D. PERFORMING   [facility], Outpatient  cc:  -------------------------------------------------------------------  ------------------------------------------------------------------- Indications:      (R06.02).  ------------------------------------------------------------------- History:   PMH:   Dyspnea.  Coronary artery disease.  Risk factors: Hypertension. Dyslipidemia.  ------------------------------------------------------------------- Impressions:  - Abnormal stress echo. She has normal LV function at rest. The stress apical 2 images is slightly rotated but shows hypokinesis in the apical inferior ( inferior septal ) region Suggest cardiac cath  Recommendations:  Suggest Cardiac cath  ------------------------------------------------------------------- Study data:   Study status:  Routine.  Consent:  The risks, benefits, and alternatives to the  procedure were explained to the patient and informed consent was obtained.  Procedure:  The patient reported no pain pre or post test. Initial setup. The patient was brought to the laboratory. A baseline ECG was recorded. Surface ECG leads and automatic cuff blood pressure measurements were monitored. Treadmill exercise testing was performed using the Bruce protocol. The patient exercised for 7 min, to protocol stage 3, to a maximal work rate of 8.5 mets. Exercise was terminated due to achievement of target heart rate and fatigue. Transthoracic stress echocardiography for risk stratification. Images were captured at baseline and peak exercise.  Study completion:  The patient tolerated the procedure well. There were no complications. Bruce protocol. Stress echocardiography.  Birthdate:  Patient birthdate: 1941/06/20.  Age:  Patient is 82 yr old.  Sex:  Gender: female.    BMI: 25.2 kg/m^2.  Blood pressure:     132/62  Patient status:  Outpatient.  Study date:  Study date: 05/20/2017. Study time: 07:55 AM.  -------------------------------------------------------------------  ------------------------------------------------------------------- Stress protocol:  +---------------------+---+------------+--------+ !Stage                !HR !BP (mmHg)   !Symptoms! +---------------------+---+------------+--------+ !Baseline             !75 !136/86 (103)!None    ! +---------------------+---+------------+--------+ !Stage 1              !111!133/69 (90) !--------! +---------------------+---+------------+--------+ !Stage 2              !136!181/81 (114)!--------! +---------------------+---+------------+--------+ !Stage 3              !  150!------------!Fatigue ! +---------------------+---+------------+--------+ !Immediate post stress!150!184/61 (102)!--------! +---------------------+---+------------+--------+ !Recovery; 1 min       !138!------------!--------! +---------------------+---+------------+--------+ !Recovery; 2 min      !103!------------!--------! +---------------------+---+------------+--------+ !Recovery; 3 min      !80 !------------!--------! +---------------------+---+------------+--------+ !Recovery; 5 min      !78 !155/56 (89) !--------! +---------------------+---+------------+--------+ !Recovery; 10 min     !---!138/55 (83) !--------! +---------------------+---+------------+--------+  ------------------------------------------------------------------- Stress results:   Maximal heart rate during stress was 150 bpm (103% of maximal predicted heart rate). The maximal predicted heart rate was 146 bpm.The target heart rate was achieved. The heart rate response to stress was normal. There was a normal resting blood pressure with a hypertensive response to stress. The rate-pressure product for the peak heart rate and blood pressure was 40981 mm Hg/min.  The patient experienced no chest pain during stress.  ------------------------------------------------------------------- Stress ECG:  There is significant artifact at the end of the stress test. Evaluation of the ST segments is not possible.  ------------------------------------------------------------------- Baseline:  - LV global systolic function was normal. - Normal wall motion; no LV regional wall motion abnormalities.  Peak stress:  - LV global systolic function was normal. - Moderate hypokinesis of the apical inferior LV myocardium.  ------------------------------------------------------------------- Prepared and Electronically Authenticated by  Kristeen Miss, M.D. 2018-06-26T16:36:22     MONITORS  CARDIAC EVENT MONITOR 04/27/2019  Narrative The basic rhythm is normal sinus There is no atrial fibrillation or flutter There are no bradycardic events There are few PVCs and occasional ventricular runs up to 9 beats           Risk  Assessment/Calculations:             Physical Exam:   VS:  BP 128/62 (BP Location: Right Arm, Patient Position: Sitting, Cuff Size: Normal)   Pulse 96   Ht 5' (1.524 m)   Wt 116 lb 3.2 oz (52.7 kg)   LMP 11/25/1994   SpO2 100%   BMI 22.69 kg/m    Wt Readings from Last 3 Encounters:  10/13/23 116 lb 3.2 oz (52.7 kg)  06/11/23 113 lb 12.8 oz (51.6 kg)  07/23/22 122 lb 8 oz (55.6 kg)    GEN: Well nourished, well developed in no acute distress NECK: No JVD; No carotid bruits CARDIAC: tachycardic, RRR, no murmurs, rubs, gallops RESPIRATORY:  Clear to auscultation without rales, wheezing or rhonchi  ABDOMEN: Soft, non-tender, non-distended EXTREMITIES:  No edema; No deformity   ASSESSMENT AND PLAN: .       Syncope and Lightheadedness Recent episode of syncope and ongoing lightheadedness, particularly in the evenings. No associated chest pain, dyspnea, or palpitations. Blood pressure readings at home have been variable, but in-office readings are within normal limits. Anticipate etiology dehydration. orthostatic vital signs today unremarkable. -Order echocardiogram to assess cardiac function and valve integrity. -Order Zio monitor for 2 weeks to assess for arrhythmias. -Encourage increased fluid intake to at least 64 ounces per day. -Labs today: BMP, TSH, CBC.  Hypertension Patient has been taking Metoprolol intermittently based on home blood pressure readings. In-office readings are within normal limits. -Advise patient to bring home blood pressure cuff to next visit for accuracy check. -Encourage consistent daily use of Metoprolol Succinate 12.5mg  due to known PAC/PVC.  Pre-diabetes Most recent A1c of 6.0, indicating borderline pre-diabetes. -Order blood work to assess current A1c at her request as PCP was checking soon to avoid multiple lab sticks.  Vertebral Fracture Recent fall with subsequent diagnosis of vertebral fracture. Currently wearing a back brace for  support. -Continue current management with physical therapy and back brace as needed.             Dispo: follow up in 2-3 months with Dr. Excell Seltzer, Tereso Newcomer, PA, or Alver Sorrow, NP   Signed, Alver Sorrow, NP

## 2023-10-13 ENCOUNTER — Encounter (HOSPITAL_BASED_OUTPATIENT_CLINIC_OR_DEPARTMENT_OTHER): Payer: Self-pay | Admitting: Family

## 2023-10-13 ENCOUNTER — Ambulatory Visit (HOSPITAL_BASED_OUTPATIENT_CLINIC_OR_DEPARTMENT_OTHER): Payer: Medicare Other | Admitting: Family

## 2023-10-13 ENCOUNTER — Other Ambulatory Visit (HOSPITAL_BASED_OUTPATIENT_CLINIC_OR_DEPARTMENT_OTHER): Payer: Medicare Other

## 2023-10-13 VITALS — BP 128/62 | HR 96 | Ht 60.0 in | Wt 116.2 lb

## 2023-10-13 DIAGNOSIS — R739 Hyperglycemia, unspecified: Secondary | ICD-10-CM

## 2023-10-13 DIAGNOSIS — I2510 Atherosclerotic heart disease of native coronary artery without angina pectoris: Secondary | ICD-10-CM

## 2023-10-13 DIAGNOSIS — R55 Syncope and collapse: Secondary | ICD-10-CM

## 2023-10-13 DIAGNOSIS — I493 Ventricular premature depolarization: Secondary | ICD-10-CM

## 2023-10-13 DIAGNOSIS — I491 Atrial premature depolarization: Secondary | ICD-10-CM | POA: Diagnosis not present

## 2023-10-13 DIAGNOSIS — G4733 Obstructive sleep apnea (adult) (pediatric): Secondary | ICD-10-CM

## 2023-10-13 DIAGNOSIS — E785 Hyperlipidemia, unspecified: Secondary | ICD-10-CM

## 2023-10-13 NOTE — Patient Instructions (Signed)
Medication Instructions:  Your physician recommends that you continue on your current medications as directed. Please refer to the Current Medication list given to you today.  *If you need a refill on your cardiac medications before your next appointment, please call your pharmacy*  Lab Work: A1C, BMP, CBC, TSH - today  If you have labs (blood work) drawn today and your tests are completely normal, you will receive your results only by: MyChart Message (if you have MyChart) OR A paper copy in the mail If you have any lab test that is abnormal or we need to change your treatment, we will call you to review the results.   Testing/Procedures: Your physician has requested that you have an echocardiogram. Echocardiography is a painless test that uses sound waves to create images of your heart. It provides your doctor with information about the size and shape of your heart and how well your heart's chambers and valves are working. This procedure takes approximately one hour. There are no restrictions for this procedure. Please do NOT wear cologne, perfume, aftershave, or lotions (deodorant is allowed). Please arrive 15 minutes prior to your appointment time.  Please note: We ask at that you not bring children with you during ultrasound (echo/ vascular) testing. Due to room size and safety concerns, children are not allowed in the ultrasound rooms during exams. Our front office staff cannot provide observation of children in our lobby area while testing is being conducted. An adult accompanying a patient to their appointment will only be allowed in the ultrasound room at the discretion of the ultrasound technician under special circumstances. We apologize for any inconvenience.   Follow-Up: At [facility], you and your health needs are our priority.  As part of our continuing mission to provide you with exceptional heart care, we have created designated Provider Care Teams.  These Care  Teams include your primary Cardiologist (physician) and Advanced Practice Providers (APPs -  Physician Assistants and Nurse Practitioners) who all work together to provide you with the care you need, when you need it.  We recommend signing up for the patient portal called "MyChart".  Sign up information is provided on this After Visit Summary.  MyChart is used to connect with patients for Virtual Visits (Telemedicine).  Patients are able to view lab/test results, encounter notes, upcoming appointments, etc.  Non-urgent messages can be sent to your provider as well.   To learn more about what you can do with MyChart, go to [email or web address].    Your next appointment:   2-3 month(s)  Provider:   Tereso Newcomer, NP or Gillian Shields, NP

## 2023-10-14 LAB — CBC
Hematocrit: 38.2 % (ref 34.0–46.6)
Hemoglobin: 11.1 g/dL (ref 11.1–15.9)
MCH: 23.3 pg — ABNORMAL LOW (ref 26.6–33.0)
MCHC: 29.1 g/dL — ABNORMAL LOW (ref 31.5–35.7)
MCV: 80 fL (ref 79–97)
Platelets: 207 10*3/uL (ref 150–450)
RBC: 4.77 x10E6/uL (ref 3.77–5.28)
RDW: 17.1 % — ABNORMAL HIGH (ref 11.7–15.4)
WBC: 10.1 10*3/uL (ref 3.4–10.8)

## 2023-10-14 LAB — BASIC METABOLIC PANEL
BUN/Creatinine Ratio: 20 (ref 12–28)
BUN: 16 mg/dL (ref 8–27)
CO2: 21 mmol/L (ref 20–29)
Calcium: 9.7 mg/dL (ref 8.7–10.3)
Chloride: 103 mmol/L (ref 96–106)
Creatinine, Ser: 0.79 mg/dL (ref 0.57–1.00)
Glucose: 102 mg/dL — ABNORMAL HIGH (ref 70–99)
Potassium: 4.7 mmol/L (ref 3.5–5.2)
Sodium: 139 mmol/L (ref 134–144)
eGFR: 76 mL/min/{1.73_m2} (ref >59)

## 2023-10-14 LAB — TSH: TSH: 2.19 u[IU]/mL (ref 0.450–4.500)

## 2023-10-14 LAB — HEMOGLOBIN A1C
Est. average glucose Bld gHb Est-mCnc: 123 mg/dL
Hgb A1c MFr Bld: 5.9 % — ABNORMAL HIGH (ref 4.8–5.6)

## 2023-10-17 ENCOUNTER — Telehealth: Payer: Self-pay | Admitting: Cardiovascular Disease

## 2023-10-17 ENCOUNTER — Telehealth (HOSPITAL_BASED_OUTPATIENT_CLINIC_OR_DEPARTMENT_OTHER): Payer: Self-pay | Admitting: Family

## 2023-10-17 NOTE — Telephone Encounter (Signed)
New Message:     Patient wants Dr Excell Seltzer and all of her providers here that her My Chart does not work. She said please do not put anything in her My-Chart, she will not be able to get it.

## 2023-10-17 NOTE — Telephone Encounter (Signed)
Called patient regarding lab results. She verbalized understanding and was thankful for call.

## 2023-10-17 NOTE — Telephone Encounter (Signed)
Sticky note made in system that pt prefers to not use MyChart. Was going to deactivate account so that nothing accidentally would get overlooked, but shows pt was just in her account today, so will leave active.

## 2023-10-17 NOTE — Telephone Encounter (Signed)
Follow Up:    Patient says she would like her lab results from 10-13-23 please. aShe says she can not get into her My Chart.

## 2023-11-13 ENCOUNTER — Telehealth (HOSPITAL_BASED_OUTPATIENT_CLINIC_OR_DEPARTMENT_OTHER): Payer: Self-pay

## 2023-11-13 NOTE — Telephone Encounter (Signed)
-----   Message from Alver Sorrow sent at 11/13/2023 11:07 AM EST ----- Monitor with predominantly normal sinus rhythm average heart rate of 69 bpm.  There were short bursts of fast heart rate, ensure taking Metoprolol as prescribed. No significant arrhythmias that would contribute to syncope. Good result! Proceed with echocardiogram as scheduled 11/17/23.

## 2023-11-13 NOTE — Telephone Encounter (Signed)
Called and spoke to patient. Discussed heart monitor results. She verbalized understanding. Pt mentioned going to ED where they prescribed her ABT for 3 days - Azithromycin and Ceftriaxone. After taking, she obtained oral thrust. Recommended following up with PCP.

## 2023-11-17 ENCOUNTER — Ambulatory Visit (HOSPITAL_COMMUNITY): Payer: Medicare Other

## 2023-11-20 ENCOUNTER — Telehealth (HOSPITAL_BASED_OUTPATIENT_CLINIC_OR_DEPARTMENT_OTHER): Payer: Self-pay

## 2023-11-20 ENCOUNTER — Ambulatory Visit (HOSPITAL_COMMUNITY): Payer: Medicare Other | Attending: Family

## 2023-11-20 DIAGNOSIS — R55 Syncope and collapse: Secondary | ICD-10-CM | POA: Diagnosis present

## 2023-11-20 DIAGNOSIS — I491 Atrial premature depolarization: Secondary | ICD-10-CM | POA: Insufficient documentation

## 2023-11-20 LAB — ECHOCARDIOGRAM COMPLETE: Area-P 1/2: 3.96 cm2

## 2023-11-20 NOTE — Telephone Encounter (Signed)
-----   Message from Alver Sorrow sent at 11/20/2023  4:35 PM EST ----- Echo normal heart pumping function. Mild thickening of heart muscle - we prevent this from worsening by keeping BP well controlled. This is unchanged from prior. No significant abnormalities that would cause syncope. Good result!

## 2023-11-20 NOTE — Telephone Encounter (Signed)
Called and spoke to patient; discussed results with patient. She verbalized understanding. Any and all questions were answered.

## 2023-12-03 ENCOUNTER — Ambulatory Visit (HOSPITAL_BASED_OUTPATIENT_CLINIC_OR_DEPARTMENT_OTHER): Payer: Medicare Other

## 2023-12-03 ENCOUNTER — Encounter (HOSPITAL_BASED_OUTPATIENT_CLINIC_OR_DEPARTMENT_OTHER): Payer: Self-pay | Admitting: Pulmonary Disease

## 2023-12-03 ENCOUNTER — Telehealth (HOSPITAL_BASED_OUTPATIENT_CLINIC_OR_DEPARTMENT_OTHER): Payer: Self-pay | Admitting: Pulmonary Disease

## 2023-12-03 ENCOUNTER — Ambulatory Visit (HOSPITAL_BASED_OUTPATIENT_CLINIC_OR_DEPARTMENT_OTHER): Payer: Medicare Other | Admitting: Pulmonary Disease

## 2023-12-03 VITALS — BP 118/60 | HR 83 | Resp 14 | Ht 60.0 in | Wt 110.0 lb

## 2023-12-03 DIAGNOSIS — K2289 Other specified disease of esophagus: Secondary | ICD-10-CM | POA: Diagnosis not present

## 2023-12-03 DIAGNOSIS — J45998 Other asthma: Secondary | ICD-10-CM

## 2023-12-03 DIAGNOSIS — M341 CR(E)ST syndrome: Secondary | ICD-10-CM

## 2023-12-03 NOTE — Progress Notes (Signed)
 Subjective:    Patient ID: Kristine [name], female    DOB: August 28, 1941, 83 y.o.   MRN: [id number]  HPI  The patient, with a history of CREST syndrome diagnosed in the 1980s, presents with recent onset of asthma and fainting episodes. She reports a severe respiratory episode in dec 2024 , which required hospitalization and intubation. The episode occurred suddenly during a trip to [facility], following a day of increased breathing difficulty.  CT angiogram chest was negative for pulm embolism,  she also reports fainting episodes, the most recent of which occurred two weeks ago. This was reviewed by cardiology, consultation reviewed, echo showed normal LVEF mild asymmetric hypertrophy of septum    The patient has been managing her CREST syndrome for over 40 years, with symptoms including esophageal reflux, scleroderma, and Raynaud's. She has been under the care of a gastroenterologist for her esophageal reflux. She reports no issues with swallowing solid foods and has made dietary changes to manage her health, including avoiding sweets and spicy foods.  The patient also has a history of heart disease, with two stents placed ten years ago following a heart attack. She is currently on Symbicort  for her asthma, but only uses it when her breathing becomes heavy. She also uses albuterol  and a nebulizer as needed.  PMH : CREST syndrome, Barrett's esophagus, and acid reflux;   Significant tests/ events reviewed  Spirometry 05/2018 ratio 74, FEV1 91%, FVC 84% Spirometry 06/2022 no airway obstruction, ratio 74, FEV1 76%, FVC 75%  HRCT 05/2018 no ILD   Past Medical History:  Diagnosis Date   Anemia    Arthritis    Asthma    Barrett's esophagus    Stage B   CAD (coronary artery disease)    a. s/p STEMI 7/14 >> LHC: LM 20, mLAD 30-40, small Dx 100 (L-L collats), OM1 90, OM2 30-40, mRCA 50-60, Lat/AL akinesis, EF 35-40% >> PCI: Promus DES to OM1;  b. Myoview 3/16: diaph atten, no ischemia, EF 59%  (intermediate risk b/c BP drop 152>>137, but recovery BP 162 (tech error) >> med Rx continued  c. cath 05/30/17 s/p FFR guided PCI with DES to circumflex/first OM    COVID-19 virus infection    CREST variant of scleroderma (HCC)    Cystocele    Diverticulitis    Event monitor 04/2019   Event monitor 04/2019: Sinus rhythm; no atrial fibrillation/flutter; no bradycardic events; few PVCs and occasional ventricular runs up to 9 beats   GERD (gastroesophageal reflux disease)    History of blood transfusion    hx of being Anti-K positive   History of Doppler ultrasound    a. Carotid US  2/12: bilateral ICA < 50%   Hyperlipidemia    Hypertension    Ischemic cardiomyopathy    a. EF 35-40% by LHC at time of MI;  b. Echo 7/14:  Mild focal basal septal hypertrophy, EF 50-55%, apical HK, grade 1 diastolic dysfunction, trivial AI    Ischemic colitis (HCC)    Orthostatic hypotension    OSA on CPAP    Osteoporosis 12/2017   T score -2.6   Raynaud disease    Rectocele    mild    Past Surgical History:  Procedure Laterality Date   ABDOMINAL HYSTERECTOMY  1996   RSO for menorrhagia.   CORONARY ANGIOPLASTY WITH STENT PLACEMENT  06/21/2013   CORONARY PRESSURE/FFR STUDY N/A 05/30/2017   Procedure: Intravascular Pressure Wire/FFR Study;  Surgeon: Wonda Sharper, MD;  Location[facility] INVASIVE CV LAB;  Service: Cardiovascular;  Laterality: N/A;   CORONARY STENT INTERVENTION N/A 05/30/2017   Procedure: Coronary Stent Intervention;  Surgeon: Wonda Sharper, MD;  Location: [facility] INVASIVE CV LAB;  Service: Cardiovascular;  Laterality: N/A;   FOOT SURGERY  2013   x2   JOINT REPLACEMENT  9/10   rt total   LEFT HEART CATH AND CORONARY ANGIOGRAPHY N/A 05/30/2017   Procedure: Left Heart Cath and Coronary Angiography;  Surgeon: Wonda Sharper, MD;  Location: [facility] INVASIVE CV LAB;  Service: Cardiovascular;  Laterality: N/A;   nissen fundoplication  2006   ROTATOR CUFF REPAIR     TONSILLECTOMY  1950   TOTAL KNEE  ARTHROPLASTY     right   TUBAL LIGATION  1980    Allergies  Allergen Reactions   Nsaids Other (See Comments)    Pt is prone to bleeding   Aspirin  Other (See Comments)    Other reaction(s): Other Can take 81 mg but not aspirin  based medications Pt is prone to bleeding, ok with low dose coated aspirin     Social History   Socioeconomic History   Marital status: Divorced    Spouse name: Not on file   Number of children: Not on file   Years of education: Not on file   Highest education level: Not on file  Occupational History   Not on file  Tobacco Use   Smoking status: Never   Smokeless tobacco: Never  Vaping Use   Vaping status: Never Used  Substance and Sexual Activity   Alcohol use: No    Alcohol/week: 0.0 standard drinks of alcohol   Drug use: No   Sexual activity: Never    Comment: 1st intercourse 71 yo-1 partner  Other Topics Concern   Not on file  Social History Narrative   Exercise dancing 3 x times weekly for 1-2 hours   Social Drivers of Corporate Investment Banker Strain: Not on file  Food Insecurity: No Food Insecurity (11/29/2021)   Received from [facility], [facility]   Hunger Vital Sign    Worried About Running Out of Food in the Last Year: Never true    Ran Out of Food in the Last Year: Never true  Transportation Needs: Not on file  Physical Activity: Not on file  Stress: Not on file  Social Connections: Unknown (03/26/2022)   Received from [facility]   Social Network    Social Network: Not on file  Intimate Partner Violence: Not At Risk (07/29/2023)   Received from [facility]   HITS    Over the last 12 months how often did your partner physically hurt you?: Never    Over the last 12 months how often did your partner insult you or talk down to you?: Never    Over the last 12 months how often did your partner threaten you with physical harm?: Never    Over the last 12 months how often did your partner scream or curse at  you?: Never     Family History  Problem Relation Age of Onset   Cancer Mother        Pancreatic Cancer   Hepatitis C Mother    Heart disease Father        congestive heart failure   Heart attack Father    Panic disorder Daughter    Hypertension Daughter    Asthma Maternal Grandmother    Emphysema Maternal Grandmother    Cancer Maternal Grandfather  lung   Stroke Neg Hx     Review of Systems Constitutional: negative for anorexia, fevers and sweats  Eyes: negative for irritation, redness and visual disturbance  Ears, nose, mouth, throat, and face: negative for earaches, epistaxis, nasal congestion and sore throat  Respiratory: negative for cough, dyspnea on exertion, sputum and wheezing  Cardiovascular: negative for chest pain, dyspnea, lower extremity edema, orthopnea, palpitations and syncope  Gastrointestinal: negative for abdominal pain, constipation, diarrhea, melena, nausea and vomiting  Genitourinary:negative for dysuria, frequency and hematuria  Hematologic/lymphatic: negative for bleeding, easy bruising and lymphadenopathy  Musculoskeletal:negative for arthralgias, muscle weakness and stiff joints  Neurological: negative for coordination problems, gait problems, headaches and weakness  Endocrine: negative for diabetic symptoms including polydipsia, polyuria and weight loss     Objective:   Physical Exam  Gen. Pleasant, well-nourished, in no distress ENT - no thrush, no pallor/icterus,no post nasal drip Neck: No JVD, no thyromegaly, no carotid bruits Lungs: no use of accessory muscles, no dullness to percussion, clear without rales or rhonchi  Cardiovascular: Rhythm regular, heart sounds  normal, no murmurs or gallops, no peripheral edema Musculoskeletal: No deformities, no cyanosis or clubbing        Assessment & Plan:    Not convinced about the diagnosis of new onset asthma here.  Allergy  testing has been negative and multiple previous spirometry has  not shown any evidence of airway obstruction.  She has a large dilated esophagus and wonders if this is in someway contributing to her episode of respiratory failure.  She denies obvious aspiration episodes or esophageal obstruction   Asthma Asthma diagnosed one year ago with exacerbations, particularly in [facility], leading to severe respiratory distress requiring intubation. Differential includes aspiration due to esophageal dysfunction. Currently on Symbicort  and albuterol  inhaler but not using Symbicort  daily. Emphasized the importance of daily Symbicort  use. - Instruct daily use of Symbicort  - Continue albuterol  inhaler as needed - Order chest x-ray - Check oxygen levels during ambulation - Obtain scan results from [facility]  Esophageal Dysmotility Severe esophageal dysmotility with significant dilation and fluid retention noted on imaging, potentially contributing to respiratory symptoms through aspiration. Discussed need for gastroenterological evaluation. - Follow-up with gastroenterologist  CREST Syndrome Diagnosed in the 1980s with CREST syndrome, including calcinosis, Raynaud's phenomenon, esophageal dysmotility, sclerodactyly, and telangiectasia. Significant esophageal involvement may contribute to respiratory symptoms through aspiration. Discussed potential link between esophageal dysfunction and respiratory issues. - Refer to rheumatologist for CREST syndrome management - Follow-up with gastroenterologist  Syncope Episodes of fainting and lightheadedness with unclear etiology. Discussed need to monitor symptoms and consider further evaluation if episodes persist. - Monitor symptoms and consider further evaluation if episodes persist  Coronary Artery Disease History of myocardial infarction with two stents placed. Ongoing cardiology follow-up discussed. - Continue cardiology follow-up

## 2023-12-03 NOTE — Telephone Encounter (Signed)
 Patient forgot to ask patient regarding her not having asthma. Patient has avoided going to the beach, but now since she does not have asthma does Dr. Jude think it's okay for her to start going to the beach again? Please advise  Patient cannot access mychart and will need a call back instead of a mychart message

## 2023-12-03 NOTE — Patient Instructions (Addendum)
  X refer to rheumatologist  X CXR today  X AMbulatory sat  X Obtain CT results from [facility]  Use symbicort twice daily

## 2023-12-04 ENCOUNTER — Encounter (HOSPITAL_BASED_OUTPATIENT_CLINIC_OR_DEPARTMENT_OTHER): Payer: Self-pay

## 2023-12-04 NOTE — Telephone Encounter (Signed)
 Left voicemail for patient to call back. Nothing further needed

## 2023-12-12 ENCOUNTER — Emergency Department (HOSPITAL_BASED_OUTPATIENT_CLINIC_OR_DEPARTMENT_OTHER): Payer: Medicare Other

## 2023-12-12 ENCOUNTER — Other Ambulatory Visit: Payer: Self-pay

## 2023-12-12 ENCOUNTER — Emergency Department (HOSPITAL_BASED_OUTPATIENT_CLINIC_OR_DEPARTMENT_OTHER)
Admission: EM | Admit: 2023-12-12 | Discharge: 2023-12-13 | Disposition: A | Discharge status: Home / Self Care | Payer: Medicare Other | Attending: Emergency Medicine | Admitting: Emergency Medicine

## 2023-12-12 ENCOUNTER — Encounter (HOSPITAL_BASED_OUTPATIENT_CLINIC_OR_DEPARTMENT_OTHER): Payer: Self-pay

## 2023-12-12 DIAGNOSIS — Z23 Encounter for immunization: Secondary | ICD-10-CM | POA: Diagnosis not present

## 2023-12-12 DIAGNOSIS — W541XXA Struck by dog, initial encounter: Secondary | ICD-10-CM | POA: Insufficient documentation

## 2023-12-12 DIAGNOSIS — S52501A Unspecified fracture of the lower end of right radius, initial encounter for closed fracture: Secondary | ICD-10-CM | POA: Insufficient documentation

## 2023-12-12 DIAGNOSIS — K449 Diaphragmatic hernia without obstruction or gangrene: Secondary | ICD-10-CM | POA: Diagnosis not present

## 2023-12-12 DIAGNOSIS — S0990XA Unspecified injury of head, initial encounter: Secondary | ICD-10-CM | POA: Diagnosis present

## 2023-12-12 DIAGNOSIS — S0083XA Contusion of other part of head, initial encounter: Secondary | ICD-10-CM | POA: Insufficient documentation

## 2023-12-12 DIAGNOSIS — I2510 Atherosclerotic heart disease of native coronary artery without angina pectoris: Secondary | ICD-10-CM | POA: Diagnosis not present

## 2023-12-12 DIAGNOSIS — W19XXXA Unspecified fall, initial encounter: Secondary | ICD-10-CM

## 2023-12-12 DIAGNOSIS — I700 Atherosclerosis of aorta: Secondary | ICD-10-CM | POA: Diagnosis not present

## 2023-12-12 DIAGNOSIS — D72829 Elevated white blood cell count, unspecified: Secondary | ICD-10-CM | POA: Diagnosis not present

## 2023-12-12 DIAGNOSIS — S80212A Abrasion, left knee, initial encounter: Secondary | ICD-10-CM | POA: Diagnosis not present

## 2023-12-12 DIAGNOSIS — Z7982 Long term (current) use of aspirin: Secondary | ICD-10-CM | POA: Diagnosis not present

## 2023-12-12 DIAGNOSIS — R0781 Pleurodynia: Secondary | ICD-10-CM | POA: Insufficient documentation

## 2023-12-12 MED ORDER — TETANUS-DIPHTH-ACELL PERTUSSIS 5-2.5-18.5 LF-MCG/0.5 IM SUSY
0.5000 mL | PREFILLED_SYRINGE | Freq: Once | INTRAMUSCULAR | Status: AC
  Administered 2023-12-13: 0.5 mL via INTRAMUSCULAR
  Filled 2023-12-12: qty 0.5

## 2023-12-12 MED ORDER — OXYCODONE-ACETAMINOPHEN 5-325 MG PO TABS
1.0000 | ORAL_TABLET | Freq: Once | ORAL | Status: DC
  Filled 2023-12-12: qty 1

## 2023-12-12 NOTE — ED Triage Notes (Signed)
Pt reports falling after trying to grab a running dog's leash. Pt struck head on concrete and complains or right hip, right wrist and left knee pain. Pt does not taking any blood thinners and had no LOC. Pt has hematoma to right side forehead. Pt denies any other symptoms.

## 2023-12-12 NOTE — ED Provider Notes (Signed)
[place] [facility] Provider Note   CSN: [id number] Arrival date & time: 12/12/23  1915     History {Add pertinent medical, surgical, social history, OB history to HPI:1} Chief Complaint  Patient presents with   Kristine [name] is a 83 y.o. female.  Patient with a history of CAD, CREST syndrome, anemia, GERD presents with right rib and wrist pain.  States she fell trying to grab the dog's leash and fell onto her outstretched hand and wrist area.  Did strike her head but did not lose consciousness.  Complains of pain to right wrist, right ribs, right head and left knee.  No blood thinner use.  Denies loss of consciousness.  No vomiting.  No chest pain.  Has pain to palpation of right lateral ribs area.  Pain to left knee and right wrist.  No focal weakness, numbness or tingling.  No midline neck or back pain.  No visual changes. No pain with urination or blood in the urine  The history is provided by the patient.  Fall Pertinent negatives include no abdominal pain, no headaches and no shortness of breath.       Home Medications Prior to Admission medications   Medication Sig Start Date End Date Taking? Authorizing Provider  acetaminophen (TYLENOL) 500 MG tablet Take 1,000 mg by mouth every 6 (six) hours as needed for headache.    [provider]  albuterol (VENTOLIN HFA) 108 (90 Base) MCG/ACT inhaler 1 puff as needed Inhalation every 4 hrs 03/14/22   [provider]  ALPRAZolam (XANAX) 0.25 MG tablet Take 1 tablet (0.25 mg total) by mouth daily as needed for anxiety. 09/17/17   Ethelda Chick, MD  aspirin EC 81 MG EC tablet Take 1 tablet (81 mg total) by mouth daily. 06/24/13   Barrett, Joline Salt, PA-C  budesonide-formoterol (SYMBICORT) 80-4.5 MCG/ACT inhaler Inhale 2 puffs into the lungs in the morning and at bedtime. with spacer and rinse mouth afterwards. 07/23/22   Ellamae Sia, DO  Cholecalciferol (VITAMIN D3) 75 MCG (3000  UT) TABS Take 1 tablet by mouth daily.    [provider]  famotidine (PEPCID) 40 MG tablet Take 40 mg by mouth daily.    [provider]  folic acid-vitamin b complex-vitamin c-selenium-zinc (DIALYVITE) 3 MG TABS tablet Take 1 tablet by mouth daily.    [provider]  ipratropium (ATROVENT) 0.02 % nebulizer solution 2.5 mL Inhalation 6 hours prn for 30 days    [provider]  metoprolol succinate (TOPROL XL) 25 MG 24 hr tablet Take 0.5 tablets (12.5 mg total) by mouth daily. 07/30/23   Tereso Newcomer T, PA-C  niacinamide 500 MG tablet Take 500 mg by mouth daily.    [provider]  nitroGLYCERIN (NITROSTAT) 0.4 MG SL tablet Place 1 tablet (0.4 mg total) under the tongue every 5 (five) minutes x 3 doses as needed for chest pain. 06/11/23   Tereso Newcomer T, PA-C  pantoprazole (PROTONIX) 40 MG tablet Take 40 mg by mouth daily.    [provider]  pravastatin (PRAVACHOL) 20 MG tablet TAKE 1 TABLET BY MOUTH IN THE  EVENING 07/24/23   Tonny Bollman, MD  Probiotic Product (ALIGN PO) Take by mouth. Per patient taking 1 tablet daily    [provider]  traMADol (ULTRAM) 50 MG tablet Take 50 mg by mouth every 6 (six) hours as needed.    [provider]  Allergies    Nsaids and Aspirin    Review of Systems   Review of Systems  Constitutional:  Negative for activity change, appetite change and fever.  HENT:  Negative for congestion and rhinorrhea.   Respiratory:  Negative for cough, chest tightness and shortness of breath.   Gastrointestinal:  Negative for abdominal pain, nausea and vomiting.  Genitourinary:  Negative for dysuria and hematuria.  Musculoskeletal:  Positive for arthralgias and myalgias.  Skin:  Positive for wound. Negative for rash.  Neurological:  Negative for dizziness, weakness and headaches.   all other systems are negative except as noted in the HPI and PMH.    Physical Exam Updated Vital Signs BP (!)  160/74   Pulse 66   Temp 98.6 F (37 C) (Oral)   Resp 18   Ht 5' (1.524 m)   Wt 49.9 kg   LMP 11/25/1994   SpO2 100%   BMI 21.48 kg/m  Physical Exam Vitals and nursing note reviewed.  Constitutional:      General: She is not in acute distress.    Appearance: She is well-developed.  HENT:     Head: Normocephalic.     Comments: Hematoma right forehead    Mouth/Throat:     Pharynx: No oropharyngeal exudate.  Eyes:     Conjunctiva/sclera: Conjunctivae normal.     Pupils: Pupils are equal, round, and reactive to light.  Neck:     Comments: No midline C-spine tenderness Cardiovascular:     Rate and Rhythm: Normal rate and regular rhythm.     Heart sounds: Normal heart sounds. No murmur heard. Pulmonary:     Effort: Pulmonary effort is normal. No respiratory distress.     Breath sounds: Normal breath sounds.  Abdominal:     Palpations: Abdomen is soft.     Tenderness: There is no abdominal tenderness. There is no guarding or rebound.  Musculoskeletal:        General: No tenderness. Normal range of motion.     Cervical back: Normal range of motion and neck supple.     Comments: No midline T or L-spine tenderness.  Tender right medial wrist and dorsal hand.  Intact radial pulse and cardinal hand movements.  Full range of motion of right shoulder and elbow.  Abrasion left knee without bony deformity Full range of motion hips bilaterally without pain.  Skin:    General: Skin is warm.  Neurological:     Mental Status: She is alert and oriented to person, place, and time.     Cranial Nerves: No cranial nerve deficit.     Motor: No abnormal muscle tone.     Coordination: Coordination normal.     Comments:  5/5 strength throughout. CN 2-12 intact.Equal grip strength.   Psychiatric:        Behavior: Behavior normal.     ED Results / Procedures / Treatments   Labs (all labs ordered are listed, but only abnormal results are displayed) Labs Reviewed  CBC WITH  DIFFERENTIAL/PLATELET  COMPREHENSIVE METABOLIC PANEL    EKG None  Radiology DG Ribs Unilateral W/Chest Right Result Date: 12/12/2023 CLINICAL DATA:  Pain EXAM: RIGHT RIBS AND CHEST - 3+ VIEW COMPARISON:  12/03/2023.  CT 12/23/2008. FINDINGS: Heart mediastinal contours within normal limits. Aortic atherosclerosis. Moderate hiatal hernia and dilated gas-filled esophagus. No confluent airspace opacities or effusions. No acute bony abnormality. No visible displaced rib fracture. IMPRESSION: No visible rib fracture. Stable dilated gas-filled esophagus and hiatal hernia. No active disease.  Electronically Signed   By: Charlett Nose M.D.   On: 12/12/2023 20:44   DG Knee Complete 4 Views Left Result Date: 12/12/2023 CLINICAL DATA:  Left knee pain, fell EXAM: LEFT KNEE - COMPLETE 4+ VIEW COMPARISON:  None Available. FINDINGS: Frontal, bilateral oblique, lateral views of the left knee are obtained. No acute fracture, subluxation, or dislocation. Mild 3 compartmental osteoarthritis greatest in the lateral compartment. No joint effusion. Soft tissues are unremarkable. IMPRESSION: 1. Mild 3 compartmental osteoarthritis.  No acute fracture. Electronically Signed   By: Sharlet Salina M.D.   On: 12/12/2023 20:42   DG Wrist Complete Right Result Date: 12/12/2023 CLINICAL DATA:  Wrist injury.  Status post fall. EXAM: RIGHT WRIST - COMPLETE 3+ VIEW COMPARISON:  None Available. FINDINGS: The bones are osteopenic. There is an acute, slightly impacted, distal radius fracture with mild dorsal angulation. No signs of scratch set no additional acute bone abnormality. No significant arthropathy. IMPRESSION: Acute, slightly impacted, distal radius fracture with mild dorsal angulation. Electronically Signed   By: Signa Kell M.D.   On: 12/12/2023 20:40   CT Head Wo Contrast Result Date: 12/12/2023 CLINICAL DATA:  Facial trauma, blunt. Fall hitting head on concrete. Hematoma to right side of forehead. EXAM: CT HEAD WITHOUT  CONTRAST TECHNIQUE: Contiguous axial images were obtained from the base of the skull through the vertex without intravenous contrast. RADIATION DOSE REDUCTION: This exam was performed according to the departmental dose-optimization program which includes automated exposure control, adjustment of the mA and/or kV according to patient size and/or use of iterative reconstruction technique. COMPARISON:  07/04/2016. FINDINGS: Brain: No acute intracranial hemorrhage, midline shift or mass effect. No extra-axial fluid collection is seen. There is diffuse cortical atrophy. Periventricular white matter hypodensities are present bilaterally. There is ex vacuo dilatation of the ventricles. No hydrocephalus is seen. Vascular: No hyperdense vessel or unexpected calcification. Skull: Normal. Negative for fracture or focal lesion. Sinuses/Orbits: No acute finding. Other: Scalp hematoma is present over the frontal bone on the right. IMPRESSION: 1. No acute intracranial process. 2. Atrophy with chronic microvascular ischemic changes. 3. Scalp hematoma over the frontal bone on the right. Electronically Signed   By: Thornell Sartorius M.D.   On: 12/12/2023 20:13    Procedures Procedures  {Document cardiac monitor, telemetry assessment procedure when appropriate:1}  Medications Ordered in ED Medications  Tdap (BOOSTRIX) injection 0.5 mL (has no administration in time range)  oxyCODONE-acetaminophen (PERCOCET/ROXICET) 5-325 MG per tablet 1 tablet (has no administration in time range)    ED Course/ Medical Decision Making/ A&P   {   Click here for ABCD2, HEART and other calculatorsREFRESH Note before signing :1}                              Medical Decision Making Amount and/or Complexity of Data Reviewed Independent Historian: caregiver Labs: ordered. Decision-making details documented in ED Course. Radiology: ordered and independent interpretation performed. Decision-making details documented in ED  Course. ECG/medicine tests: ordered and independent interpretation performed. Decision-making details documented in ED Course.  Risk Prescription drug management.   Mechanical fall with right wrist pain, right rib pain, left knee pain and head injury.  No loss conscious.  No blood thinner use.  GCS is 15, ABCs are intact.  Update tetanus.  X-rays obtained in triage show a distal right radius fracture without significant angulation or displacement. {Document critical care time when appropriate:1} {Document review of labs and clinical decision  tools ie heart score, Chads2Vasc2 etc:1}  {Document your independent review of radiology images, and any outside records:1} {Document your discussion with family members, caretakers, and with consultants:1} {Document social determinants of health affecting pt's care:1} {Document your decision making why or why not admission, treatments were needed:1} Final Clinical Impression(s) / ED Diagnoses Final diagnoses:  None    Rx / DC Orders ED Discharge Orders     None

## 2023-12-13 ENCOUNTER — Emergency Department (HOSPITAL_BASED_OUTPATIENT_CLINIC_OR_DEPARTMENT_OTHER): Payer: Medicare Other

## 2023-12-13 ENCOUNTER — Encounter (HOSPITAL_BASED_OUTPATIENT_CLINIC_OR_DEPARTMENT_OTHER): Payer: Self-pay

## 2023-12-13 ENCOUNTER — Other Ambulatory Visit: Payer: Self-pay

## 2023-12-13 LAB — CBC WITH DIFFERENTIAL/PLATELET
Abs Immature Granulocytes: 0.1 10*3/uL — ABNORMAL HIGH (ref 0.00–0.07)
Basophils Absolute: 0.1 10*3/uL (ref 0.0–0.1)
Basophils Relative: 0 %
Eosinophils Absolute: 0.1 10*3/uL (ref 0.0–0.5)
Eosinophils Relative: 1 %
HCT: 34.9 % — ABNORMAL LOW (ref 36.0–46.0)
Hemoglobin: 10.3 g/dL — ABNORMAL LOW (ref 12.0–15.0)
Immature Granulocytes: 1 %
Lymphocytes Relative: 14 %
Lymphs Abs: 2.4 10*3/uL (ref 0.7–4.0)
MCH: 22.7 pg — ABNORMAL LOW (ref 26.0–34.0)
MCHC: 29.5 g/dL — ABNORMAL LOW (ref 30.0–36.0)
MCV: 77 fL — ABNORMAL LOW (ref 80.0–100.0)
Monocytes Absolute: 1.7 10*3/uL — ABNORMAL HIGH (ref 0.1–1.0)
Monocytes Relative: 10 %
Neutro Abs: 12.6 10*3/uL — ABNORMAL HIGH (ref 1.7–7.7)
Neutrophils Relative %: 74 %
Platelets: 242 10*3/uL (ref 150–400)
RBC: 4.53 MIL/uL (ref 3.87–5.11)
RDW: 19.8 % — ABNORMAL HIGH (ref 11.5–15.5)
WBC: 17 10*3/uL — ABNORMAL HIGH (ref 4.0–10.5)
nRBC: 0 % (ref 0.0–0.2)

## 2023-12-13 LAB — COMPREHENSIVE METABOLIC PANEL
ALT: 15 U/L (ref 0–44)
AST: 19 U/L (ref 15–41)
Albumin: 3.7 g/dL (ref 3.5–5.0)
Alkaline Phosphatase: 77 U/L (ref 38–126)
Anion gap: 9 (ref 5–15)
BUN: 15 mg/dL (ref 8–23)
CO2: 22 mmol/L (ref 22–32)
Calcium: 9.7 mg/dL (ref 8.9–10.3)
Chloride: 105 mmol/L (ref 98–111)
Creatinine, Ser: 0.66 mg/dL (ref 0.44–1.00)
GFR, Estimated: >60 mL/min (ref >60)
Glucose, Bld: 113 mg/dL — ABNORMAL HIGH (ref 70–99)
Potassium: 3.8 mmol/L (ref 3.5–5.1)
Sodium: 136 mmol/L (ref 135–145)
Total Bilirubin: 0.5 mg/dL (ref 0.0–1.2)
Total Protein: 6.5 g/dL (ref 6.5–8.1)

## 2023-12-13 LAB — TROPONIN I (HIGH SENSITIVITY)
Troponin I (High Sensitivity): 7 ng/L (ref <18)
Troponin I (High Sensitivity): 7 ng/L (ref <18)

## 2023-12-13 MED ORDER — OXYCODONE HCL 5 MG PO TABS: 5.0000 mg | ORAL_TABLET | Freq: Four times a day (QID) | ORAL | 0 refills | Status: DC | PRN

## 2023-12-13 MED ORDER — IOHEXOL 300 MG/ML  SOLN
80.0000 mL | Freq: Once | INTRAMUSCULAR | Status: AC | PRN
  Administered 2023-12-13: 80 mL via INTRAVENOUS

## 2023-12-13 NOTE — Discharge Instructions (Signed)
Your testing is reassuring.  CT scan of your head and neck are negative for traumatic injury.  You do have a distal radius fracture.  Keep splint in place and keep arm elevated.  Take the pain medication as prescribed.  You may also take tramadol instead but do not take both tramadol and Vicodin. Follow-up with a hand doctor for further evaluation next week.  Return to the ED with new or worsening symptoms.

## 2023-12-15 ENCOUNTER — Encounter (HOSPITAL_BASED_OUTPATIENT_CLINIC_OR_DEPARTMENT_OTHER): Payer: Self-pay | Admitting: Family Medicine

## 2023-12-16 ENCOUNTER — Encounter (HOSPITAL_BASED_OUTPATIENT_CLINIC_OR_DEPARTMENT_OTHER): Payer: Self-pay

## 2023-12-30 ENCOUNTER — Ambulatory Visit (HOSPITAL_BASED_OUTPATIENT_CLINIC_OR_DEPARTMENT_OTHER): Payer: Medicare Other | Admitting: Family

## 2024-01-28 ENCOUNTER — Ambulatory Visit (HOSPITAL_BASED_OUTPATIENT_CLINIC_OR_DEPARTMENT_OTHER): Payer: Medicare Other | Admitting: Pulmonary Disease

## 2024-04-08 NOTE — Progress Notes (Deleted)
 Office Visit Note  Patient: Kristine [name]             Date of Birth: May 04, 1941           MRN: [id number]             PCP: Sharma Dears, NP Referring: Lind Repine, MD Visit Date: 04/22/2024 Occupation: @GUAROCC @  Subjective:  No chief complaint on file.   History of Present Illness: Kristine [name] is a 83 y.o. female ***   Echo 11/20/23  CXR ordered by Dr. Dessa Floss 12/03/23:Negative for acute cardiopulmonary disease. Similar appearance of dilated esophagus Endoscopy 03/26/24  Activities of Daily Living:  Patient reports morning stiffness for *** {minute/hour:19697}.   Patient {ACTIONS;DENIES/REPORTS:21021675::"Denies"} nocturnal pain.  Difficulty dressing/grooming: {ACTIONS;DENIES/REPORTS:21021675::"Denies"} Difficulty climbing stairs: {ACTIONS;DENIES/REPORTS:21021675::"Denies"} Difficulty getting out of chair: {ACTIONS;DENIES/REPORTS:21021675::"Denies"} Difficulty using hands for taps, buttons, cutlery, and/or writing: {ACTIONS;DENIES/REPORTS:21021675::"Denies"}  No Rheumatology ROS completed.   PMFS History:  Patient Active Problem List   Diagnosis Date Noted   Upper respiratory tract infection 09/27/2022   Mild intermittent reactive airway disease 07/24/2022   Chronic rhinitis 07/24/2022   Vertigo 07/24/2021   Pseudophakia of left eye 05/31/2021   Pseudophakia of right eye 05/31/2021   History of osteoporosis 05/27/2021   Mononucleosis 05/27/2021   Cystoid macular edema of left eye 10/11/2020   MI (myocardial infarction) (HCC) 10/09/2020   COVID-19 virus infection 05/04/2020   Type 2 macular telangiectasis, right 03/20/2020   Cystoid macular edema of right eye 03/20/2020   Type 2 macular telangiectasis, left 03/20/2020   OSA on CPAP 03/20/2020   Cr(e)st syndrome (HCC) 05/25/2018   Ulcer of esophagus with bleeding 02/27/2018   Psychophysiological insomnia 09/24/2017   Abnormal nuclear cardiac imaging test 05/30/2017   Abnormal stress echo    Solar lentigo  11/08/2016   Acute blood loss anemia 10/10/2015   Red blood cell antibody positive 10/04/2015   S/P coronary artery stent placement 07/12/2014   CAD (coronary artery disease), native coronary artery 06/27/2013   Hyperlipidemia 06/27/2013   Chest pain of uncertain etiology 06/26/2013   History of ST elevation myocardial infarction (STEMI) 06/24/2013   Hypertension    Great toe pain 09/11/2012   H/O foot surgery 04/09/2012   Hallux varus, acquired 02/25/2012   GERD (gastroesophageal reflux disease)    Barrett's esophagus    Cystocele    Rectocele     Past Medical History:  Diagnosis Date   Anemia    Arthritis    Asthma    Barrett's esophagus    Stage B   CAD (coronary artery disease)    a. s/p STEMI 7/14 >> LHC: LM 20, mLAD 30-40, small Dx 100 (L-L collats), OM1 90, OM2 30-40, mRCA 50-60, Lat/AL akinesis, EF 35-40% >> PCI: Promus DES to OM1;  b. Myoview 3/16: diaph atten, no ischemia, EF 59% (intermediate risk b/c BP drop 152>>137, but recovery BP 162 (tech error) >> med Rx continued  c. cath 05/30/17 s/p FFR guided PCI with DES to circumflex/first OM    COVID-19 virus infection    CREST variant of scleroderma (HCC)    Cystocele    Diverticulitis    Event monitor 04/2019   Event monitor 04/2019: Sinus rhythm; no atrial fibrillation/flutter; no bradycardic events; few PVCs and occasional ventricular runs up to 9 beats   GERD (gastroesophageal reflux disease)    History of blood transfusion    hx of being Anti-K positive   History of Doppler ultrasound    a. Carotid US  2/12:  bilateral ICA < 50%   Hyperlipidemia    Hypertension    Ischemic cardiomyopathy    a. EF 35-40% by LHC at time of MI;  b. Echo 7/14:  Mild focal basal septal hypertrophy, EF 50-55%, apical HK, grade 1 diastolic dysfunction, trivial AI    Ischemic colitis (HCC)    Orthostatic hypotension    OSA on CPAP    Osteoporosis 12/2017   T score -2.6   Raynaud disease    Rectocele    mild    Family History   Problem Relation Age of Onset   Cancer Mother        Pancreatic Cancer   Hepatitis C Mother    Heart disease Father        congestive heart failure   Heart attack Father    Panic disorder Daughter    Hypertension Daughter    Asthma Maternal Grandmother    Emphysema Maternal Grandmother    Cancer Maternal Grandfather        lung   Stroke Neg Hx    Past Surgical History:  Procedure Laterality Date   ABDOMINAL HYSTERECTOMY  1996   RSO for menorrhagia.   CORONARY ANGIOPLASTY WITH STENT PLACEMENT  06/21/2013   CORONARY PRESSURE/FFR STUDY N/A 05/30/2017   Procedure: Intravascular Pressure Wire/FFR Study;  Surgeon: Arnoldo Lapping, MD;  Location[facility] INVASIVE CV LAB;  Service: Cardiovascular;  Laterality: N/A;   CORONARY STENT INTERVENTION N/A 05/30/2017   Procedure: Coronary Stent Intervention;  Surgeon: Arnoldo Lapping, MD;  Location: [facility] INVASIVE CV LAB;  Service: Cardiovascular;  Laterality: N/A;   FOOT SURGERY  2013   x2   JOINT REPLACEMENT  9/10   rt total   LEFT HEART CATH AND CORONARY ANGIOGRAPHY N/A 05/30/2017   Procedure: Left Heart Cath and Coronary Angiography;  Surgeon: Arnoldo Lapping, MD;  Location: [facility] INVASIVE CV LAB;  Service: Cardiovascular;  Laterality: N/A;   nissen fundoplication  2006   ROTATOR CUFF REPAIR     TONSILLECTOMY  1950   TOTAL KNEE ARTHROPLASTY     right   TUBAL LIGATION  1980   Social History   Social History Narrative   Exercise dancing 3 x times weekly for 1-2 hours   Immunization History  Administered Date(s) Administered   Influenza Inj Mdck Quad With Preservative 08/22/2017   Influenza, High Dose Seasonal PF 09/18/2018   Influenza,inj,Quad PF,6+ Mos 08/22/2013, 08/19/2014   Pneumococcal Conjugate-13 11/23/2015   Tdap 12/13/2023     Objective: Vital Signs: LMP 11/25/1994    Physical Exam   Musculoskeletal Exam: ***  CDAI Exam: CDAI Score: -- Patient Global: --; Provider Global: -- Swollen: --; Tender: -- Joint Exam 04/22/2024    No joint exam has been documented for this visit   There is currently no information documented on the homunculus. Go to the Rheumatology activity and complete the homunculus joint exam.  Investigation: No additional findings.  Imaging: No results found.  Recent Labs: Lab Results  Component Value Date   WBC 17.0 (H) 12/13/2023   HGB 10.3 (L) 12/13/2023   PLT 242 12/13/2023   NA 136 12/13/2023   K 3.8 12/13/2023   CL 105 12/13/2023   CO2 22 12/13/2023   GLUCOSE 113 (H) 12/13/2023   BUN 15 12/13/2023   CREATININE 0.66 12/13/2023   BILITOT 0.5 12/13/2023   ALKPHOS 77 12/13/2023   AST 19 12/13/2023   ALT 15 12/13/2023   PROT 6.5 12/13/2023   ALBUMIN 3.7 12/13/2023   CALCIUM  9.7 12/13/2023  GFRAA 88 05/24/2021    Speciality Comments: No specialty comments available.  Procedures:  No procedures performed Allergies: Nsaids and Aspirin    Assessment / Plan:     Visit Diagnoses: CREST syndrome (HCC)  History of Barrett esophagus  Coronary artery disease involving native coronary artery of native heart without angina pectoris  S/P coronary artery stent placement  History of ST elevation myocardial infarction (STEMI)  Type 2 macular telangiectasis, left  Type 2 macular telangiectasis, right  Mild intermittent reactive airway disease  OSA on CPAP  Gastroesophageal reflux disease without esophagitis  Ulcer of esophagus with bleeding  Rectocele  H/O foot surgery  History of osteoporosis  Pure hypercholesterolemia  Psychophysiological insomnia  Orders: No orders of the defined types were placed in this encounter.  No orders of the defined types were placed in this encounter.   Face-to-face time spent with patient was *** minutes. Greater than 50% of time was spent in counseling and coordination of care.  Follow-Up Instructions: No follow-ups on file.   Romayne Clubs, PA-C  Note - This record has been created using Dragon software.  Chart  creation errors have been sought, but may not always  have been located. Such creation errors do not reflect on  the standard of medical care.

## 2024-04-22 ENCOUNTER — Encounter: Payer: Medicare Other | Admitting: Rheumatology

## 2024-04-22 DIAGNOSIS — Z8739 Personal history of other diseases of the musculoskeletal system and connective tissue: Secondary | ICD-10-CM

## 2024-04-22 DIAGNOSIS — Z9889 Other specified postprocedural states: Secondary | ICD-10-CM

## 2024-04-22 DIAGNOSIS — N816 Rectocele: Secondary | ICD-10-CM

## 2024-04-22 DIAGNOSIS — Z8719 Personal history of other diseases of the digestive system: Secondary | ICD-10-CM

## 2024-04-22 DIAGNOSIS — H35072 Retinal telangiectasis, left eye: Secondary | ICD-10-CM

## 2024-04-22 DIAGNOSIS — J4520 Mild intermittent asthma, uncomplicated: Secondary | ICD-10-CM

## 2024-04-22 DIAGNOSIS — F5104 Psychophysiologic insomnia: Secondary | ICD-10-CM

## 2024-04-22 DIAGNOSIS — K219 Gastro-esophageal reflux disease without esophagitis: Secondary | ICD-10-CM

## 2024-04-22 DIAGNOSIS — K2211 Ulcer of esophagus with bleeding: Secondary | ICD-10-CM

## 2024-04-22 DIAGNOSIS — I2510 Atherosclerotic heart disease of native coronary artery without angina pectoris: Secondary | ICD-10-CM

## 2024-04-22 DIAGNOSIS — G4733 Obstructive sleep apnea (adult) (pediatric): Secondary | ICD-10-CM

## 2024-04-22 DIAGNOSIS — E7800 Pure hypercholesterolemia, unspecified: Secondary | ICD-10-CM

## 2024-04-22 DIAGNOSIS — M341 CR(E)ST syndrome: Secondary | ICD-10-CM

## 2024-04-22 DIAGNOSIS — Z955 Presence of coronary angioplasty implant and graft: Secondary | ICD-10-CM

## 2024-04-22 DIAGNOSIS — I252 Old myocardial infarction: Secondary | ICD-10-CM

## 2024-04-22 DIAGNOSIS — H35071 Retinal telangiectasis, right eye: Secondary | ICD-10-CM

## 2024-04-26 ENCOUNTER — Telehealth (HOSPITAL_BASED_OUTPATIENT_CLINIC_OR_DEPARTMENT_OTHER): Payer: Self-pay

## 2024-04-26 DIAGNOSIS — M341 CR(E)ST syndrome: Secondary | ICD-10-CM

## 2024-04-26 NOTE — Addendum Note (Signed)
 Addended by: Kary Pages on: 04/26/2024 09:58 AM   Modules accepted: Orders

## 2024-04-26 NOTE — Telephone Encounter (Signed)
 Copied from CRM [id number]. Topic: Referral - Question >> Apr 23, 2024  3:47 PM Laurina Popper O wrote: Reason for EAV:[id number]  was calling to request that dr Villa Greaser reach out to rheumatologist because she missed her appointment . patient is requesting a referral to a different rheumatologist office if possible. Patient says she has been waiting for 3 months for this referral and patient says she had so much going on . Someone stole patients information. [phone] >> Apr 26, 2024  7:54 AM Katherin Pam wrote: Please note that she also no showed Dr Villa Greaser on 3/5. She has not been seen since January 2025.

## 2024-05-06 ENCOUNTER — Encounter: Payer: Self-pay | Admitting: Cardiovascular Disease

## 2024-05-25 ENCOUNTER — Ambulatory Visit: Payer: Medicare Other | Admitting: Rheumatology

## 2024-06-03 ENCOUNTER — Inpatient Hospital Stay (HOSPITAL_BASED_OUTPATIENT_CLINIC_OR_DEPARTMENT_OTHER)
Admission: EM | Admit: 2024-06-03 | Discharge: 2024-06-11 | DRG: 280 | Disposition: A | Discharge status: Skilled Nursing Facility | Attending: Internal Medicine | Admitting: Internal Medicine

## 2024-06-03 ENCOUNTER — Encounter (HOSPITAL_BASED_OUTPATIENT_CLINIC_OR_DEPARTMENT_OTHER): Payer: Self-pay

## 2024-06-03 ENCOUNTER — Other Ambulatory Visit: Payer: Self-pay

## 2024-06-03 DIAGNOSIS — J9602 Acute respiratory failure with hypercapnia: Secondary | ICD-10-CM

## 2024-06-03 DIAGNOSIS — J9601 Acute respiratory failure with hypoxia: Secondary | ICD-10-CM | POA: Diagnosis present

## 2024-06-03 DIAGNOSIS — M341 CR(E)ST syndrome: Secondary | ICD-10-CM | POA: Diagnosis present

## 2024-06-03 DIAGNOSIS — D72829 Elevated white blood cell count, unspecified: Secondary | ICD-10-CM | POA: Diagnosis present

## 2024-06-03 DIAGNOSIS — E785 Hyperlipidemia, unspecified: Secondary | ICD-10-CM | POA: Diagnosis present

## 2024-06-03 DIAGNOSIS — D62 Acute posthemorrhagic anemia: Secondary | ICD-10-CM

## 2024-06-03 DIAGNOSIS — Z8616 Personal history of COVID-19: Secondary | ICD-10-CM

## 2024-06-03 DIAGNOSIS — Z1152 Encounter for screening for COVID-19: Secondary | ICD-10-CM

## 2024-06-03 DIAGNOSIS — Z7951 Long term (current) use of inhaled steroids: Secondary | ICD-10-CM

## 2024-06-03 DIAGNOSIS — D509 Iron deficiency anemia, unspecified: Secondary | ICD-10-CM | POA: Diagnosis present

## 2024-06-03 DIAGNOSIS — Z8249 Family history of ischemic heart disease and other diseases of the circulatory system: Secondary | ICD-10-CM

## 2024-06-03 DIAGNOSIS — I2510 Atherosclerotic heart disease of native coronary artery without angina pectoris: Secondary | ICD-10-CM | POA: Diagnosis present

## 2024-06-03 DIAGNOSIS — Z90710 Acquired absence of both cervix and uterus: Secondary | ICD-10-CM

## 2024-06-03 DIAGNOSIS — K449 Diaphragmatic hernia without obstruction or gangrene: Secondary | ICD-10-CM | POA: Diagnosis present

## 2024-06-03 DIAGNOSIS — G8929 Other chronic pain: Secondary | ICD-10-CM | POA: Diagnosis present

## 2024-06-03 DIAGNOSIS — M1990 Unspecified osteoarthritis, unspecified site: Secondary | ICD-10-CM | POA: Diagnosis present

## 2024-06-03 DIAGNOSIS — K219 Gastro-esophageal reflux disease without esophagitis: Secondary | ICD-10-CM | POA: Diagnosis present

## 2024-06-03 DIAGNOSIS — I255 Ischemic cardiomyopathy: Secondary | ICD-10-CM | POA: Diagnosis present

## 2024-06-03 DIAGNOSIS — I472 Ventricular tachycardia, unspecified: Secondary | ICD-10-CM | POA: Diagnosis present

## 2024-06-03 DIAGNOSIS — G4733 Obstructive sleep apnea (adult) (pediatric): Secondary | ICD-10-CM | POA: Diagnosis present

## 2024-06-03 DIAGNOSIS — Z7982 Long term (current) use of aspirin: Secondary | ICD-10-CM

## 2024-06-03 DIAGNOSIS — Z955 Presence of coronary angioplasty implant and graft: Secondary | ICD-10-CM

## 2024-06-03 DIAGNOSIS — R001 Bradycardia, unspecified: Secondary | ICD-10-CM | POA: Diagnosis present

## 2024-06-03 DIAGNOSIS — I358 Other nonrheumatic aortic valve disorders: Secondary | ICD-10-CM | POA: Diagnosis present

## 2024-06-03 DIAGNOSIS — E876 Hypokalemia: Secondary | ICD-10-CM | POA: Diagnosis present

## 2024-06-03 DIAGNOSIS — Z825 Family history of asthma and other chronic lower respiratory diseases: Secondary | ICD-10-CM

## 2024-06-03 DIAGNOSIS — R110 Nausea: Secondary | ICD-10-CM | POA: Diagnosis not present

## 2024-06-03 DIAGNOSIS — K2289 Other specified disease of esophagus: Secondary | ICD-10-CM | POA: Diagnosis present

## 2024-06-03 DIAGNOSIS — Z886 Allergy status to analgesic agent status: Secondary | ICD-10-CM

## 2024-06-03 DIAGNOSIS — R918 Other nonspecific abnormal finding of lung field: Secondary | ICD-10-CM | POA: Diagnosis present

## 2024-06-03 DIAGNOSIS — I252 Old myocardial infarction: Secondary | ICD-10-CM

## 2024-06-03 DIAGNOSIS — M810 Age-related osteoporosis without current pathological fracture: Secondary | ICD-10-CM | POA: Diagnosis present

## 2024-06-03 DIAGNOSIS — I5033 Acute on chronic diastolic (congestive) heart failure: Secondary | ICD-10-CM | POA: Diagnosis present

## 2024-06-03 DIAGNOSIS — R0600 Dyspnea, unspecified: Secondary | ICD-10-CM

## 2024-06-03 DIAGNOSIS — Z79899 Other long term (current) drug therapy: Secondary | ICD-10-CM

## 2024-06-03 DIAGNOSIS — I10 Essential (primary) hypertension: Secondary | ICD-10-CM | POA: Diagnosis present

## 2024-06-03 DIAGNOSIS — I214 Non-ST elevation (NSTEMI) myocardial infarction: Secondary | ICD-10-CM | POA: Diagnosis not present

## 2024-06-03 DIAGNOSIS — I110 Hypertensive heart disease with heart failure: Secondary | ICD-10-CM | POA: Diagnosis present

## 2024-06-03 DIAGNOSIS — R55 Syncope and collapse: Secondary | ICD-10-CM | POA: Diagnosis not present

## 2024-06-03 DIAGNOSIS — Z96651 Presence of right artificial knee joint: Secondary | ICD-10-CM | POA: Diagnosis present

## 2024-06-03 DIAGNOSIS — I493 Ventricular premature depolarization: Secondary | ICD-10-CM | POA: Diagnosis present

## 2024-06-03 DIAGNOSIS — K2270 Barrett's esophagus without dysplasia: Secondary | ICD-10-CM | POA: Diagnosis present

## 2024-06-03 DIAGNOSIS — F17210 Nicotine dependence, cigarettes, uncomplicated: Secondary | ICD-10-CM | POA: Diagnosis present

## 2024-06-03 DIAGNOSIS — R0602 Shortness of breath: Principal | ICD-10-CM

## 2024-06-03 DIAGNOSIS — K220 Achalasia of cardia: Secondary | ICD-10-CM | POA: Diagnosis present

## 2024-06-03 DIAGNOSIS — J441 Chronic obstructive pulmonary disease with (acute) exacerbation: Secondary | ICD-10-CM | POA: Diagnosis present

## 2024-06-03 DIAGNOSIS — M549 Dorsalgia, unspecified: Secondary | ICD-10-CM | POA: Diagnosis present

## 2024-06-03 LAB — CBC
HCT: 35.8 % — ABNORMAL LOW (ref 36.0–46.0)
Hemoglobin: 10.6 g/dL — ABNORMAL LOW (ref 12.0–15.0)
MCH: 22.2 pg — ABNORMAL LOW (ref 26.0–34.0)
MCHC: 29.6 g/dL — ABNORMAL LOW (ref 30.0–36.0)
MCV: 75.1 fL — ABNORMAL LOW (ref 80.0–100.0)
Platelets: 199 K/uL (ref 150–400)
RBC: 4.77 MIL/uL (ref 3.87–5.11)
RDW: 19.5 % — ABNORMAL HIGH (ref 11.5–15.5)
WBC: 9.6 K/uL (ref 4.0–10.5)
nRBC: 0 % (ref 0.0–0.2)

## 2024-06-03 MED ORDER — ALBUTEROL SULFATE (2.5 MG/3ML) 0.083% IN NEBU
2.5000 mg | INHALATION_SOLUTION | Freq: Once | RESPIRATORY_TRACT | Status: AC
  Administered 2024-06-04: 2.5 mg via RESPIRATORY_TRACT
  Filled 2024-06-03: qty 3

## 2024-06-03 MED ORDER — IPRATROPIUM-ALBUTEROL 0.5-2.5 (3) MG/3ML IN SOLN
3.0000 mL | Freq: Once | RESPIRATORY_TRACT | Status: AC
  Administered 2024-06-04: 3 mL via RESPIRATORY_TRACT
  Filled 2024-06-03: qty 3

## 2024-06-03 NOTE — ED Triage Notes (Signed)
 PT arrived to ED with shortness of breath and audible wheezing. Started and hour and half ago.   PT report having an episode in December of 2024 were she was intubated.  Aox4, feels tightness on her chest

## 2024-06-04 ENCOUNTER — Inpatient Hospital Stay (HOSPITAL_COMMUNITY)

## 2024-06-04 ENCOUNTER — Emergency Department (HOSPITAL_BASED_OUTPATIENT_CLINIC_OR_DEPARTMENT_OTHER)

## 2024-06-04 DIAGNOSIS — R7989 Other specified abnormal findings of blood chemistry: Secondary | ICD-10-CM | POA: Diagnosis not present

## 2024-06-04 DIAGNOSIS — I214 Non-ST elevation (NSTEMI) myocardial infarction: Secondary | ICD-10-CM | POA: Diagnosis present

## 2024-06-04 DIAGNOSIS — I255 Ischemic cardiomyopathy: Secondary | ICD-10-CM | POA: Diagnosis present

## 2024-06-04 DIAGNOSIS — I358 Other nonrheumatic aortic valve disorders: Secondary | ICD-10-CM | POA: Diagnosis present

## 2024-06-04 DIAGNOSIS — F17210 Nicotine dependence, cigarettes, uncomplicated: Secondary | ICD-10-CM | POA: Diagnosis present

## 2024-06-04 DIAGNOSIS — Z8249 Family history of ischemic heart disease and other diseases of the circulatory system: Secondary | ICD-10-CM | POA: Diagnosis not present

## 2024-06-04 DIAGNOSIS — J441 Chronic obstructive pulmonary disease with (acute) exacerbation: Secondary | ICD-10-CM

## 2024-06-04 DIAGNOSIS — I493 Ventricular premature depolarization: Secondary | ICD-10-CM | POA: Diagnosis not present

## 2024-06-04 DIAGNOSIS — I110 Hypertensive heart disease with heart failure: Secondary | ICD-10-CM | POA: Diagnosis present

## 2024-06-04 DIAGNOSIS — E785 Hyperlipidemia, unspecified: Secondary | ICD-10-CM | POA: Diagnosis present

## 2024-06-04 DIAGNOSIS — J9601 Acute respiratory failure with hypoxia: Secondary | ICD-10-CM | POA: Diagnosis present

## 2024-06-04 DIAGNOSIS — E876 Hypokalemia: Secondary | ICD-10-CM | POA: Diagnosis present

## 2024-06-04 DIAGNOSIS — R0602 Shortness of breath: Secondary | ICD-10-CM | POA: Diagnosis present

## 2024-06-04 DIAGNOSIS — I2510 Atherosclerotic heart disease of native coronary artery without angina pectoris: Secondary | ICD-10-CM | POA: Diagnosis present

## 2024-06-04 DIAGNOSIS — Z1152 Encounter for screening for COVID-19: Secondary | ICD-10-CM | POA: Diagnosis not present

## 2024-06-04 DIAGNOSIS — G8929 Other chronic pain: Secondary | ICD-10-CM | POA: Diagnosis present

## 2024-06-04 DIAGNOSIS — Z955 Presence of coronary angioplasty implant and graft: Secondary | ICD-10-CM | POA: Diagnosis not present

## 2024-06-04 DIAGNOSIS — R55 Syncope and collapse: Secondary | ICD-10-CM | POA: Diagnosis not present

## 2024-06-04 DIAGNOSIS — Z7951 Long term (current) use of inhaled steroids: Secondary | ICD-10-CM | POA: Diagnosis not present

## 2024-06-04 DIAGNOSIS — I213 ST elevation (STEMI) myocardial infarction of unspecified site: Secondary | ICD-10-CM | POA: Diagnosis not present

## 2024-06-04 DIAGNOSIS — I5033 Acute on chronic diastolic (congestive) heart failure: Secondary | ICD-10-CM | POA: Diagnosis present

## 2024-06-04 DIAGNOSIS — M341 CR(E)ST syndrome: Secondary | ICD-10-CM | POA: Diagnosis present

## 2024-06-04 DIAGNOSIS — K220 Achalasia of cardia: Secondary | ICD-10-CM | POA: Diagnosis present

## 2024-06-04 DIAGNOSIS — R0600 Dyspnea, unspecified: Secondary | ICD-10-CM | POA: Diagnosis not present

## 2024-06-04 DIAGNOSIS — M549 Dorsalgia, unspecified: Secondary | ICD-10-CM | POA: Diagnosis present

## 2024-06-04 DIAGNOSIS — I472 Ventricular tachycardia, unspecified: Secondary | ICD-10-CM | POA: Diagnosis present

## 2024-06-04 DIAGNOSIS — D509 Iron deficiency anemia, unspecified: Secondary | ICD-10-CM | POA: Diagnosis present

## 2024-06-04 DIAGNOSIS — Z8616 Personal history of COVID-19: Secondary | ICD-10-CM | POA: Diagnosis not present

## 2024-06-04 DIAGNOSIS — G4733 Obstructive sleep apnea (adult) (pediatric): Secondary | ICD-10-CM | POA: Diagnosis present

## 2024-06-04 HISTORY — DX: Chronic obstructive pulmonary disease with (acute) exacerbation: J44.1

## 2024-06-04 LAB — HEPATIC FUNCTION PANEL
ALT: 12 U/L (ref 0–44)
AST: 20 U/L (ref 15–41)
Albumin: 4.3 g/dL (ref 3.5–5.0)
Alkaline Phosphatase: 87 U/L (ref 38–126)
Bilirubin, Direct: <0.1 mg/dL (ref 0.0–0.2)
Total Bilirubin: <0.2 mg/dL (ref 0.0–1.2)
Total Protein: 6.7 g/dL (ref 6.5–8.1)

## 2024-06-04 LAB — I-STAT VENOUS BLOOD GAS, ED
Acid-Base Excess: 0 mmol/L (ref 0.0–2.0)
Acid-Base Excess: 2 mmol/L (ref 0.0–2.0)
Acid-base deficit: 6 mmol/L — ABNORMAL HIGH (ref 0.0–2.0)
Bicarbonate: 25.1 mmol/L (ref 20.0–28.0)
Bicarbonate: 25.7 mmol/L (ref 20.0–28.0)
Bicarbonate: 27.1 mmol/L (ref 20.0–28.0)
Calcium, Ion: 1.2 mmol/L (ref 1.15–1.40)
Calcium, Ion: 1.22 mmol/L (ref 1.15–1.40)
Calcium, Ion: 1.31 mmol/L (ref 1.15–1.40)
HCT: 32 % — ABNORMAL LOW (ref 36.0–46.0)
HCT: 33 % — ABNORMAL LOW (ref 36.0–46.0)
HCT: 38 % (ref 36.0–46.0)
Hemoglobin: 10.9 g/dL — ABNORMAL LOW (ref 12.0–15.0)
Hemoglobin: 11.2 g/dL — ABNORMAL LOW (ref 12.0–15.0)
Hemoglobin: 12.9 g/dL (ref 12.0–15.0)
O2 Saturation: 83 %
O2 Saturation: 93 %
O2 Saturation: 96 %
Patient temperature: 98.7
Patient temperature: 98.7
Patient temperature: 98.7
Potassium: 3.3 mmol/L — ABNORMAL LOW (ref 3.5–5.1)
Potassium: 4.2 mmol/L (ref 3.5–5.1)
Potassium: 4.7 mmol/L (ref 3.5–5.1)
Sodium: 139 mmol/L (ref 135–145)
Sodium: 141 mmol/L (ref 135–145)
Sodium: 141 mmol/L (ref 135–145)
TCO2: 27 mmol/L (ref 22–32)
TCO2: 27 mmol/L (ref 22–32)
TCO2: 28 mmol/L (ref 22–32)
pCO2, Ven: 42.3 mmHg — ABNORMAL LOW (ref 44–60)
pCO2, Ven: 46.5 mmHg (ref 44–60)
pCO2, Ven: 75.3 mmHg (ref 44–60)
pH, Ven: 7.131 — CL (ref 7.25–7.43)
pH, Ven: 7.351 (ref 7.25–7.43)
pH, Ven: 7.415 (ref 7.25–7.43)
pO2, Ven: 65 mmHg — ABNORMAL HIGH (ref 32–45)
pO2, Ven: 72 mmHg — ABNORMAL HIGH (ref 32–45)
pO2, Ven: 82 mmHg — ABNORMAL HIGH (ref 32–45)

## 2024-06-04 LAB — RESPIRATORY PANEL BY PCR

## 2024-06-04 LAB — BASIC METABOLIC PANEL WITH GFR
Anion gap: 12 (ref 5–15)
Anion gap: 15 (ref 5–15)
BUN: 10 mg/dL (ref 8–23)
BUN: 8 mg/dL (ref 8–23)
CO2: 23 mmol/L (ref 22–32)
CO2: 25 mmol/L (ref 22–32)
Calcium: 9.8 mg/dL (ref 8.9–10.3)
Calcium: 9.8 mg/dL (ref 8.9–10.3)
Chloride: 103 mmol/L (ref 98–111)
Chloride: 104 mmol/L (ref 98–111)
Creatinine, Ser: 0.68 mg/dL (ref 0.44–1.00)
Creatinine, Ser: 0.72 mg/dL (ref 0.44–1.00)
GFR, Estimated: >60 mL/min (ref >60)
GFR, Estimated: >60 mL/min (ref >60)
Glucose, Bld: 158 mg/dL — ABNORMAL HIGH (ref 70–99)
Glucose, Bld: 175 mg/dL — ABNORMAL HIGH (ref 70–99)
Potassium: 3.6 mmol/L (ref 3.5–5.1)
Potassium: 3.7 mmol/L (ref 3.5–5.1)
Sodium: 141 mmol/L (ref 135–145)
Sodium: 141 mmol/L (ref 135–145)

## 2024-06-04 LAB — TROPONIN T, HIGH SENSITIVITY
Troponin T High Sensitivity: 111 ng/L (ref <19)
Troponin T High Sensitivity: <15 ng/L (ref <19)

## 2024-06-04 LAB — CBC
HCT: 36.2 % (ref 36.0–46.0)
Hemoglobin: 10.6 g/dL — ABNORMAL LOW (ref 12.0–15.0)
MCH: 22.2 pg — ABNORMAL LOW (ref 26.0–34.0)
MCHC: 29.3 g/dL — ABNORMAL LOW (ref 30.0–36.0)
MCV: 75.9 fL — ABNORMAL LOW (ref 80.0–100.0)
Platelets: 217 K/uL (ref 150–400)
RBC: 4.77 MIL/uL (ref 3.87–5.11)
RDW: 19.3 % — ABNORMAL HIGH (ref 11.5–15.5)
WBC: 11.9 K/uL — ABNORMAL HIGH (ref 4.0–10.5)
nRBC: 0 % (ref 0.0–0.2)

## 2024-06-04 LAB — BRAIN NATRIURETIC PEPTIDE: B Natriuretic Peptide: 1207.9 pg/mL — ABNORMAL HIGH (ref 0.0–100.0)

## 2024-06-04 LAB — LIPASE, BLOOD: Lipase: 54 U/L — ABNORMAL HIGH (ref 11–51)

## 2024-06-04 LAB — CBG MONITORING, ED: Glucose-Capillary: 165 mg/dL — ABNORMAL HIGH (ref 70–99)

## 2024-06-04 MED ORDER — PRAVASTATIN SODIUM 10 MG PO TABS
20.0000 mg | ORAL_TABLET | Freq: Every evening | ORAL | Status: DC
  Administered 2024-06-04 – 2024-06-07 (×4): 20 mg via ORAL
  Filled 2024-06-04 (×4): qty 2

## 2024-06-04 MED ORDER — METHYLPREDNISOLONE SODIUM SUCC 125 MG IJ SOLR: 125.0000 mg | INTRAMUSCULAR | Status: DC

## 2024-06-04 MED ORDER — ASPIRIN 81 MG PO TBEC
81.0000 mg | DELAYED_RELEASE_TABLET | Freq: Every day | ORAL | Status: DC
  Administered 2024-06-04 – 2024-06-09 (×5): 81 mg via ORAL
  Filled 2024-06-04 (×6): qty 1

## 2024-06-04 MED ORDER — BENZONATATE 100 MG PO CAPS
100.0000 mg | ORAL_CAPSULE | Freq: Once | ORAL | Status: AC
  Administered 2024-06-04: 100 mg via ORAL
  Filled 2024-06-04: qty 1

## 2024-06-04 MED ORDER — IPRATROPIUM-ALBUTEROL 0.5-2.5 (3) MG/3ML IN SOLN
3.0000 mL | Freq: Four times a day (QID) | RESPIRATORY_TRACT | Status: DC | PRN
  Administered 2024-06-04: 3 mL via RESPIRATORY_TRACT
  Filled 2024-06-04: qty 3

## 2024-06-04 MED ORDER — RACEPINEPHRINE HCL 2.25 % IN NEBU
INHALATION_SOLUTION | RESPIRATORY_TRACT | Status: AC
  Filled 2024-06-04: qty 15

## 2024-06-04 MED ORDER — METOPROLOL SUCCINATE ER 25 MG PO TB24
12.5000 mg | ORAL_TABLET | Freq: Every day | ORAL | Status: DC
  Administered 2024-06-04 – 2024-06-05 (×2): 12.5 mg via ORAL
  Filled 2024-06-04 (×2): qty 1

## 2024-06-04 MED ORDER — ETOMIDATE 2 MG/ML IV SOLN
INTRAVENOUS | Status: AC
  Filled 2024-06-04: qty 20

## 2024-06-04 MED ORDER — TRAMADOL HCL 50 MG PO TABS
50.0000 mg | ORAL_TABLET | Freq: Four times a day (QID) | ORAL | Status: DC | PRN
  Administered 2024-06-04 – 2024-06-09 (×2): 50 mg via ORAL
  Filled 2024-06-04 (×2): qty 1

## 2024-06-04 MED ORDER — PANTOPRAZOLE SODIUM 40 MG PO TBEC
40.0000 mg | DELAYED_RELEASE_TABLET | Freq: Every day | ORAL | Status: DC
  Administered 2024-06-04 – 2024-06-09 (×6): 40 mg via ORAL
  Filled 2024-06-04 (×7): qty 1

## 2024-06-04 MED ORDER — SUCCINYLCHOLINE CHLORIDE 200 MG/10ML IV SOSY
PREFILLED_SYRINGE | INTRAVENOUS | Status: AC
  Filled 2024-06-04: qty 10

## 2024-06-04 MED ORDER — METHYLPREDNISOLONE SODIUM SUCC 125 MG IJ SOLR
125.0000 mg | Freq: Once | INTRAMUSCULAR | Status: AC
  Administered 2024-06-04: 125 mg via INTRAVENOUS
  Filled 2024-06-04: qty 2

## 2024-06-04 MED ORDER — SODIUM CHLORIDE 0.9 % IV SOLN: 2.0000 g | INTRAVENOUS | Status: DC

## 2024-06-04 MED ORDER — FLUTICASONE FUROATE-VILANTEROL 100-25 MCG/ACT IN AEPB
1.0000 | INHALATION_SPRAY | Freq: Every day | RESPIRATORY_TRACT | Status: DC
  Administered 2024-06-04 – 2024-06-11 (×7): 1 via RESPIRATORY_TRACT
  Filled 2024-06-04: qty 28

## 2024-06-04 MED ORDER — IPRATROPIUM-ALBUTEROL 0.5-2.5 (3) MG/3ML IN SOLN
3.0000 mL | Freq: Once | RESPIRATORY_TRACT | Status: AC
  Administered 2024-06-04: 3 mL via RESPIRATORY_TRACT
  Filled 2024-06-04: qty 3

## 2024-06-04 MED ORDER — ROCURONIUM BROMIDE 10 MG/ML (PF) SYRINGE
PREFILLED_SYRINGE | INTRAVENOUS | Status: AC
  Filled 2024-06-04: qty 10

## 2024-06-04 MED ORDER — ENOXAPARIN SODIUM 40 MG/0.4ML IJ SOSY
40.0000 mg | PREFILLED_SYRINGE | INTRAMUSCULAR | Status: DC
Start: 2024-06-04 — End: 2024-06-05
  Administered 2024-06-04 – 2024-06-05 (×2): 40 mg via SUBCUTANEOUS
  Filled 2024-06-04 (×2): qty 0.4

## 2024-06-04 MED ORDER — GUAIFENESIN 100 MG/5ML PO LIQD
5.0000 mL | Freq: Once | ORAL | Status: AC
  Administered 2024-06-04: 5 mL via ORAL
  Filled 2024-06-04: qty 10

## 2024-06-04 MED ORDER — MONTELUKAST SODIUM 10 MG PO TABS: 10.0000 mg | ORAL_TABLET | Freq: Every day | ORAL | Status: DC

## 2024-06-04 MED ORDER — DIPHENHYDRAMINE HCL 25 MG PO CAPS
25.0000 mg | ORAL_CAPSULE | Freq: Once | ORAL | Status: AC
  Administered 2024-06-04: 25 mg via ORAL
  Filled 2024-06-04: qty 1

## 2024-06-04 MED ORDER — ONDANSETRON HCL 4 MG/2ML IJ SOLN
4.0000 mg | Freq: Once | INTRAMUSCULAR | Status: AC
  Administered 2024-06-04: 4 mg via INTRAVENOUS
  Filled 2024-06-04: qty 2

## 2024-06-04 MED ORDER — FAMOTIDINE 20 MG PO TABS
40.0000 mg | ORAL_TABLET | Freq: Every day | ORAL | Status: DC
  Administered 2024-06-04: 40 mg via ORAL
  Filled 2024-06-04: qty 2

## 2024-06-04 NOTE — ED Notes (Signed)
 CCMD called.

## 2024-06-04 NOTE — Progress Notes (Signed)
 [facility] night cross cover note:   I was notified by the patient's RN that the patient had demonstrated a 12 beat run of nonsustained V. tach on telemetry around 2010 this evening before spontaneously converting back to sinus rhythm, with ensuing heart rates in the 80s to 90s.  Other vital signs appear stable, including afebrile, blood pressure 137/77, respiratory rate 16.  Additionally, she is maintaining oxygen saturations of 99% on 2 L nasal cannula, which is improved from 3 to 4 L nasal cannula earlier today.  RN conveys that the patient was asymptomatic at the time of this run of nonsustained V. tach, including no chest pain, shortness of breath, or palpitations.   I subsequently added BMP and magnesium  level to the morning labs.    Eva Pore, DO Hospitalist

## 2024-06-04 NOTE — ED Provider Notes (Signed)
 [place] EMERGENCY DEPARTMENT AT [facility] Provider Note   CSN: [id number] Arrival date & time: 06/03/24  2334     Patient presents with: Shortness of Breath   Kristine [name] is a 83 y.o. female.   The history is provided by the patient, a relative and medical records.  Shortness of Breath  Kristine [name] is a 83 y.o. female who presents to the Emergency Department complaining of shortness of breath.  She presents to the emergency department accompanied by her grandson for evaluation of difficulty breathing that started just prior to ED arrival.  She states that it started when she was sitting on the couch.  She did have pancake and milk shortly prior to the episode beginning.  She does not recall any aspiration event.  She does have some nausea, epigastric discomfort.  Does not feel like there is any foreign body in her throat.  No fevers, chest pain.  She has had recurrent similar episodes in the past.  She states that 1 of those episodes required intubation while she was at [facility].    Prior to Admission medications   Medication Sig Start Date End Date Taking? Authorizing Provider  acetaminophen  (TYLENOL ) 500 MG tablet Take 1,000 mg by mouth every 6 (six) hours as needed for headache.    [provider]  albuterol  (VENTOLIN  HFA) 108 (90 Base) MCG/ACT inhaler 1 puff as needed Inhalation every 4 hrs 03/14/22   [provider]  ALPRAZolam  (XANAX ) 0.25 MG tablet Take 1 tablet (0.25 mg total) by mouth daily as needed for anxiety. 09/17/17   Claudene Rayfield HERO, MD  aspirin  EC 81 MG EC tablet Take 1 tablet (81 mg total) by mouth daily. 06/24/13   Barrett, Shona MATSU, PA-C  budesonide -formoterol  (SYMBICORT ) 80-4.5 MCG/ACT inhaler Inhale 2 puffs into the lungs in the morning and at bedtime. with spacer and rinse mouth afterwards. 07/23/22   Luke Orlan HERO, DO  Cholecalciferol (VITAMIN D3) 75 MCG (3000 UT) TABS Take 1 tablet by mouth daily.    [provider]   famotidine  (PEPCID ) 40 MG tablet Take 40 mg by mouth daily.    [provider]  folic acid-vitamin b complex-vitamin c-selenium-zinc (DIALYVITE) 3 MG TABS tablet Take 1 tablet by mouth daily.    [provider]  ipratropium (ATROVENT ) 0.02 % nebulizer solution 2.5 mL Inhalation 6 hours prn for 30 days    [provider]  metoprolol  succinate (TOPROL  XL) 25 MG 24 hr tablet Take 0.5 tablets (12.5 mg total) by mouth daily. 07/30/23   Lelon Hamilton T, PA-C  niacinamide 500 MG tablet Take 500 mg by mouth daily.    [provider]  nitroGLYCERIN  (NITROSTAT ) 0.4 MG SL tablet Place 1 tablet (0.4 mg total) under the tongue every 5 (five) minutes x 3 doses as needed for chest pain. 06/11/23   Lelon Hamilton T, PA-C  oxyCODONE  (ROXICODONE ) 5 MG immediate release tablet Take 1 tablet (5 mg total) by mouth every 6 (six) hours as needed for severe pain (pain score 7-10). 12/13/23   Rancour, Garnette, MD  pantoprazole  (PROTONIX ) 40 MG tablet Take 40 mg by mouth daily.    [provider]  pravastatin  (PRAVACHOL ) 20 MG tablet TAKE 1 TABLET BY MOUTH IN THE  EVENING 07/24/23   Wonda Sharper, MD  Probiotic Product (ALIGN PO) Take by mouth. Per patient taking 1 tablet daily    [provider]  traMADol  (ULTRAM ) 50 MG tablet Take 50 mg by mouth every  6 (six) hours as needed.    [provider]    Allergies: Nsaids and Aspirin     Review of Systems  Respiratory:  Positive for shortness of breath.   All other systems reviewed and are negative.   Updated Vital Signs BP (!) 147/82   Pulse 92   Temp (!) 97.5 F (36.4 C) (Oral)   Resp 14   LMP 11/25/1994   SpO2 100%   Physical Exam Vitals and nursing note reviewed.  Constitutional:      Appearance: She is well-developed.  HENT:     Head: Normocephalic and atraumatic.  Cardiovascular:     Rate and Rhythm: Regular rhythm. Tachycardia present.     Heart sounds: No murmur heard. Pulmonary:     Effort:  Pulmonary effort is normal. No respiratory distress.     Comments: Wheezing bilaterally with transmitted upper airway noises bilaterally Abdominal:     Palpations: Abdomen is soft.     Tenderness: There is no guarding or rebound.     Comments: Distended nontender abdomen.  Musculoskeletal:        General: No tenderness.  Skin:    General: Skin is warm and dry.  Neurological:     Mental Status: She is alert and oriented to person, place, and time.  Psychiatric:        Behavior: Behavior normal.     (all labs ordered are listed, but only abnormal results are displayed) Labs Reviewed  BASIC METABOLIC PANEL WITH GFR - Abnormal; Notable for the following components:      Result Value   Glucose, Bld 175 (*)    All other components within normal limits  CBC - Abnormal; Notable for the following components:   Hemoglobin 10.6 (*)    HCT 35.8 (*)    MCV 75.1 (*)    MCH 22.2 (*)    MCHC 29.6 (*)    RDW 19.5 (*)    All other components within normal limits  LIPASE, BLOOD - Abnormal; Notable for the following components:   Lipase 54 (*)    All other components within normal limits  I-STAT VENOUS BLOOD GAS, ED - Abnormal; Notable for the following components:   pCO2, Ven 42.3 (*)    pO2, Ven 82 (*)    HCT 32.0 (*)    Hemoglobin 10.9 (*)    All other components within normal limits  CBG MONITORING, ED - Abnormal; Notable for the following components:   Glucose-Capillary 165 (*)    All other components within normal limits  I-STAT VENOUS BLOOD GAS, ED - Abnormal; Notable for the following components:   pH, Ven 7.131 (*)    pCO2, Ven 75.3 (*)    pO2, Ven 65 (*)    Acid-base deficit 6.0 (*)    All other components within normal limits  I-STAT VENOUS BLOOD GAS, ED - Abnormal; Notable for the following components:   pO2, Ven 72 (*)    Potassium 3.3 (*)    HCT 33.0 (*)    Hemoglobin 11.2 (*)    All other components within normal limits  TROPONIN T, HIGH SENSITIVITY - Abnormal;  Notable for the following components:   Troponin T High Sensitivity 111 (*)    All other components within normal limits  HEPATIC FUNCTION PANEL  TROPONIN T, HIGH SENSITIVITY    EKG: EKG Interpretation Date/Time:  Thursday June 03 2024 23:42:07 EDT Ventricular Rate:  111 PR Interval:  134 QRS Duration:  108 QT Interval:  351 QTC  Calculation: 477 R Axis:   -52  Text Interpretation: Sinus tachycardia Paired ventricular premature complexes Left axis deviation RSR' in V1 or V2, right VCD or RVH Confirmed by Griselda Norris [phone]) on 06/03/2024 11:59:25 PM  Radiology: [facility] Chest Port 1 View Result Date: 06/04/2024 CLINICAL DATA:  Shortness of breath and wheezing. Tightness in chest. EXAM: PORTABLE CHEST 1 VIEW COMPARISON:  12/12/2023 FINDINGS: Dilated gas filled esophagogastric structure throughout the mediastinum. Appearance suggests marked esophageal dilatation, likely achalasia. Possible hiatal hernia. Similar appearance to previous study. Visualized abdominal stomach is also distended. Heart size and pulmonary vascularity are normal. Lungs are clear. Calcification of the aorta. IMPRESSION: Severely dilated gas-filled esophagus and stomach likely represent achalasia. No change. Lungs are clear. Electronically Signed   By: Elsie Gravely M.D.   On: 06/04/2024 00:37     Procedures  CRITICAL CARE Performed by: Norris Griselda   Total critical care time: 45 minutes  Critical care time was exclusive of separately billable procedures and treating other patients.  Critical care was necessary to treat or prevent imminent or life-threatening deterioration.  Critical care was time spent personally by me on the following activities: development of treatment plan with patient and/or surrogate as well as nursing, discussions with consultants, evaluation of patient's response to treatment, examination of patient, obtaining history from patient or surrogate, ordering and performing treatments and  interventions, ordering and review of laboratory studies, ordering and review of radiographic studies, pulse oximetry and re-evaluation of patient's condition.  Medications Ordered in the ED  Racepinephrine HCl 2.25 % nebulizer solution (  Not Given 06/04/24 0200)  ipratropium-albuterol  (DUONEB) 0.5-2.5 (3) MG/3ML nebulizer solution 3 mL (3 mLs Nebulization Given 06/04/24 0001)  albuterol  (PROVENTIL ) (2.5 MG/3ML) 0.083% nebulizer solution 2.5 mg (2.5 mg Nebulization Given 06/04/24 0001)  methylPREDNISolone  sodium succinate (SOLU-MEDROL ) 125 mg/2 mL injection 125 mg (125 mg Intravenous Given 06/04/24 0013)  guaiFENesin  (ROBITUSSIN) 100 MG/5ML liquid 5 mL (5 mLs Oral Given 06/04/24 0059)  benzonatate  (TESSALON ) capsule 100 mg (100 mg Oral Given 06/04/24 0100)  diphenhydrAMINE  (BENADRYL ) capsule 25 mg (25 mg Oral Given 06/04/24 0100)  ipratropium-albuterol  (DUONEB) 0.5-2.5 (3) MG/3ML nebulizer solution 3 mL (3 mLs Nebulization Given 06/04/24 0345)  ondansetron  (ZOFRAN ) injection 4 mg (4 mg Intravenous Given 06/04/24 0400)                                    Medical Decision Making Amount and/or Complexity of Data Reviewed Labs: ordered. Radiology: ordered.  Risk OTC drugs. Prescription drug management. Decision regarding hospitalization.   Patient with history of crest syndrome here for evaluation of difficulty breathing and cough.  Patient with wheezes bilaterally, transmitted upper airway noises.  Chest x-ray is negative for acute infiltrate.  She was treated with Solu-Medrol , DuoNeb.  Repeat assessment patient with no significant improvement in symptoms.  Called to bedside for decreased responsiveness.  On evaluation patient was breathing spontaneously but less responsive, diaphoretic with poor air movement bilaterally.  She was assisted with bag-valve-mask ventilation for respiratory support.  Repeat blood gas was obtained while attempting to arrange for RSI.  Blood gas does demonstrate  respiratory acidosis, unclear the source of this.  Possibly from fatigue versus reactive airway.  By the time of blood gas returning patient more awake, interactive and diaphoresis resolved with improved air movement bilaterally.  She was transition to BiPAP for ongoing respiratory support and tolerated this well.  Repeat blood gas  with improvement in her respiratory acidosis.  She was treated with additional nebs.  She has had similar episodes in the past requiring intubation for less than 24 hours, most recently when she was in [place] in December.  Overall she appears to be improving.  No definite aspiration or pneumonia at this time.  For her prior episode she did have a negative CTA at that time, current picture is not consistent with PE.  Repeat troponin is elevated, repeat troponin did occur after her respiratory event.  Patient without any chest pain.  EKG is nonischemic.  Will continue to monitor at this time.  Discussed with Dr. Layman in the ICU-unclear if patient meets ICU criteria at this time.  Plan to transfer ED to ED to [facility] for ICU evaluation at the bedside.  ED will need to inform ICU of patient's arrival.  Discussed with Dr. Lorette in the [facility] emergency department, who accepts the patient in transfer.  Family updated at bedside of patient's status and treatment plan.    Final diagnoses:  Shortness of breath  Acute respiratory failure with hypercapnia [facility])    ED Discharge Orders     None          Griselda Norris, MD 06/04/24 [phone]

## 2024-06-04 NOTE — H&P (Signed)
 History and Physical    Patient: Kristine [name] FMW:[id number] DOB: June 30, 1941 DOA: 06/03/2024 DOS: the patient was seen and examined on 06/04/2024 PCP: Ileen Rosaline NOVAK, NP  Patient coming from: Home  Chief Complaint:  Chief Complaint  Patient presents with   Shortness of Breath   HPI: Kristine [name] is a 83 y.o. female with medical history significant of CREST syndrome, CAD, STEMI s/p DES to OM1 in 2014, HLD, HTN, ischemic colitis, Barrett's esophagus, OSA on CPAP, and OA p/w AHRF.  Pt states that she has not felt well for the past week. She does not attribute her fatigue to any one thing in particular, but became concerned when she had extreme SOB - like gasping for air - yesterday evening at 2200 while watching TV. She did not use any inhalers or medication, and opted instead to call her son who brought her to the [facility].  In the ED, pt tachycardic and hypertensive. Labs notable for WBC 11.9 and VBG 7.131/65 for which placed was placed on BiPAP in ED. RVP neg. CXR showed severely dilated gas-filled esophagus and stomach likely represent achalasia. Pt admitted to medicine for further w/u.  Review of Systems: As mentioned in the history of present illness. All other systems reviewed and are negative. Past Medical History:  Diagnosis Date   Anemia    Arthritis    Asthma    Barrett's esophagus    Stage B   CAD (coronary artery disease)    a. s/p STEMI 7/14 >> LHC: LM 20, mLAD 30-40, small Dx 100 (L-L collats), OM1 90, OM2 30-40, mRCA 50-60, Lat/AL akinesis, EF 35-40% >> PCI: Promus DES to OM1;  b. Myoview 3/16: diaph atten, no ischemia, EF 59% (intermediate risk b/c BP drop 152>>137, but recovery BP 162 (tech error) >> med Rx continued  c. cath 05/30/17 s/p FFR guided PCI with DES to circumflex/first OM    COVID-19 virus infection    CREST variant of scleroderma (HCC)    Cystocele    Diverticulitis    Event monitor 04/2019   Event monitor 04/2019: Sinus rhythm; no atrial  fibrillation/flutter; no bradycardic events; few PVCs and occasional ventricular runs up to 9 beats   GERD (gastroesophageal reflux disease)    History of blood transfusion    hx of being Anti-K positive   History of Doppler ultrasound    a. Carotid US  2/12: bilateral ICA < 50%   Hyperlipidemia    Hypertension    Ischemic cardiomyopathy    a. EF 35-40% by LHC at time of MI;  b. Echo 7/14:  Mild focal basal septal hypertrophy, EF 50-55%, apical HK, grade 1 diastolic dysfunction, trivial AI    Ischemic colitis (HCC)    Orthostatic hypotension    OSA on CPAP    Osteoporosis 12/2017   T score -2.6   Raynaud disease    Rectocele    mild   Past Surgical History:  Procedure Laterality Date   ABDOMINAL HYSTERECTOMY  1996   RSO for menorrhagia.   CORONARY ANGIOPLASTY WITH STENT PLACEMENT  06/21/2013   CORONARY PRESSURE/FFR STUDY N/A 05/30/2017   Procedure: Intravascular Pressure Wire/FFR Study;  Surgeon: Wonda Sharper, MD;  Location: [facility] INVASIVE CV LAB;  Service: Cardiovascular;  Laterality: N/A;   CORONARY STENT INTERVENTION N/A 05/30/2017   Procedure: Coronary Stent Intervention;  Surgeon: Wonda Sharper, MD;  Location: [facility] INVASIVE CV LAB;  Service: Cardiovascular;  Laterality: N/A;   FOOT SURGERY  2013   x2   JOINT REPLACEMENT  9/10   rt total   LEFT HEART CATH AND CORONARY ANGIOGRAPHY N/A 05/30/2017   Procedure: Left Heart Cath and Coronary Angiography;  Surgeon: Wonda Sharper, MD;  Location: [facility] INVASIVE CV LAB;  Service: Cardiovascular;  Laterality: N/A;   nissen fundoplication  2006   ROTATOR CUFF REPAIR     TONSILLECTOMY  1950   TOTAL KNEE ARTHROPLASTY     right   TUBAL LIGATION  1980   Social History:  reports that she has never smoked. She has never used smokeless tobacco. She reports that she does not drink alcohol and does not use drugs.  Allergies  Allergen Reactions   Nsaids Other (See Comments)    Pt is prone to bleeding   Aspirin  Other (See Comments)    Other  reaction(s): Other Can take 81 mg but not aspirin  based medications Pt is prone to bleeding, ok with low dose coated aspirin     Family History  Problem Relation Age of Onset   Cancer Mother        Pancreatic Cancer   Hepatitis C Mother    Heart disease Father        congestive heart failure   Heart attack Father    Panic disorder Daughter    Hypertension Daughter    Asthma Maternal Grandmother    Emphysema Maternal Grandmother    Cancer Maternal Grandfather        lung   Stroke Neg Hx     Prior to Admission medications   Medication Sig Start Date End Date Taking? Authorizing Provider  acetaminophen  (TYLENOL ) 500 MG tablet Take 1,000 mg by mouth every 6 (six) hours as needed for headache.    [provider]  albuterol  (VENTOLIN  HFA) 108 (90 Base) MCG/ACT inhaler 1 puff as needed Inhalation every 4 hrs 03/14/22   [provider]  ALPRAZolam  (XANAX ) 0.25 MG tablet Take 1 tablet (0.25 mg total) by mouth daily as needed for anxiety. 09/17/17   Claudene Rayfield HERO, MD  aspirin  EC 81 MG EC tablet Take 1 tablet (81 mg total) by mouth daily. 06/24/13   Barrett, Shona MATSU, PA-C  budesonide -formoterol  (SYMBICORT ) 80-4.5 MCG/ACT inhaler Inhale 2 puffs into the lungs in the morning and at bedtime. with spacer and rinse mouth afterwards. 07/23/22   Luke Orlan HERO, DO  Cholecalciferol (VITAMIN D3) 75 MCG (3000 UT) TABS Take 1 tablet by mouth daily.    [provider]  famotidine  (PEPCID ) 40 MG tablet Take 40 mg by mouth daily.    [provider]  folic acid-vitamin b complex-vitamin c-selenium-zinc (DIALYVITE) 3 MG TABS tablet Take 1 tablet by mouth daily.    [provider]  ipratropium (ATROVENT ) 0.02 % nebulizer solution 2.5 mL Inhalation 6 hours prn for 30 days    [provider]  metoprolol  succinate (TOPROL  XL) 25 MG 24 hr tablet Take 0.5 tablets (12.5 mg total) by mouth daily. 07/30/23   Lelon Hamilton T, PA-C  niacinamide 500 MG tablet Take 500 mg by  mouth daily.    [provider]  nitroGLYCERIN  (NITROSTAT ) 0.4 MG SL tablet Place 1 tablet (0.4 mg total) under the tongue every 5 (five) minutes x 3 doses as needed for chest pain. 06/11/23   Lelon Hamilton T, PA-C  oxyCODONE  (ROXICODONE ) 5 MG immediate release tablet Take 1 tablet (5 mg total) by mouth every 6 (six) hours as needed for severe pain (pain score 7-10). 12/13/23   Rancour, Garnette, MD  pantoprazole  (PROTONIX ) 40 MG tablet Take  40 mg by mouth daily.    [provider]  pravastatin  (PRAVACHOL ) 20 MG tablet TAKE 1 TABLET BY MOUTH IN THE  EVENING 07/24/23   Wonda Sharper, MD  Probiotic Product (ALIGN PO) Take by mouth. Per patient taking 1 tablet daily    [provider]    Physical Exam: Vitals:   06/04/24 0535 06/04/24 0717 06/04/24 0730 06/04/24 0745  BP: (!) 147/82  (!) 145/67   Pulse: 92  92 97  Resp: 14  17 13   Temp: (!) 97.5 F (36.4 C)     TempSrc: Oral     SpO2:  100% 100% 100%   General: Alert, oriented x3, resting comfortably in no acute distress Respiratory: Lungs clear to auscultation bilaterally with diminished breath sounds throughout; no wheezing Cardiovascular: Regular rate and rhythm w/o m/r/g   Data Reviewed:  Lab Results  Component Value Date   WBC 11.9 (H) 06/04/2024   HGB 10.6 (L) 06/04/2024   HCT 36.2 06/04/2024   MCV 75.9 (L) 06/04/2024   PLT 217 06/04/2024   Lab Results  Component Value Date   GLUCOSE 158 (H) 06/04/2024   CALCIUM  9.8 06/04/2024   NA 141 06/04/2024   K 3.6 06/04/2024   CO2 25 06/04/2024   CL 104 06/04/2024   BUN 8 06/04/2024   CREATININE 0.68 06/04/2024   Lab Results  Component Value Date   ALT 12 06/04/2024   AST 20 06/04/2024   ALKPHOS 87 06/04/2024   BILITOT <0.2 06/04/2024   Lab Results  Component Value Date   INR 0.9 05/26/2017   INR 1.6 (H) 08/14/2009   INR 1.2 08/13/2009    Radiology: [facility] Chest Port 1 View Result Date: 06/04/2024 CLINICAL DATA:  Shortness of breath and  wheezing. Tightness in chest. EXAM: PORTABLE CHEST 1 VIEW COMPARISON:  12/12/2023 FINDINGS: Dilated gas filled esophagogastric structure throughout the mediastinum. Appearance suggests marked esophageal dilatation, likely achalasia. Possible hiatal hernia. Similar appearance to previous study. Visualized abdominal stomach is also distended. Heart size and pulmonary vascularity are normal. Lungs are clear. Calcification of the aorta. IMPRESSION: Severely dilated gas-filled esophagus and stomach likely represent achalasia. No change. Lungs are clear. Electronically Signed   By: Elsie Gravely M.D.   On: 06/04/2024 00:37    Assessment and Plan: 65F h/o CREST syndrome, CAD, STEMI s/p DES to OM1 in 2014, HLD, HTN, ischemic colitis, Barrett's esophagus, OSA on CPAP, and OA p/w AHRF.  AHRF High suspicion for ILD iso CREST; Ddx includes AECOPD given tobacco exposure (pt does not smoke herself, but was married to a PPD smoker for almost a decade) -Duonebs prn for now -IV solumedrol 125mg  daily for now; may d/c in 24h pending CT chest results -Incentive spirometry and flutter valve prn -Wean O2 as tolerated -F/u CT chest WO contrast; if ILD noted on imaging pt will need Pulm and Rheum consult -F/u BNP and diurese prn; may need TTE -Ambulatory pulse prior to d/c  CREST syndrome -OP Rheumatology as planned  OSA -PTA CPAP  HTN CAD -PTA metoprolol  XL 12.5mg  daily  HLD -PTA pravastatin    Advance Care Planning:   Code Status: Full Code   Consults: N/A  Family Communication: Daughter  Severity of Illness: The appropriate patient status for this patient is INPATIENT. Inpatient status is judged to be reasonable and necessary in order to provide the required intensity of service to ensure the patient's safety. The patient's presenting symptoms, physical exam findings, and initial radiographic and laboratory data in the context  of their chronic comorbidities is felt to place them at high risk for  further clinical deterioration. Furthermore, it is not anticipated that the patient will be medically stable for discharge from the hospital within 2 midnights of admission.   * I certify that at the point of admission it is my clinical judgment that the patient will require inpatient hospital care spanning beyond 2 midnights from the point of admission due to high intensity of service, high risk for further deterioration and high frequency of surveillance required.*   ------- I spent 55 minutes reviewing previous labs/notes, obtaining separate history at the bedside, counseling/discussing the treatment plan outlined above, ordering medications/tests, and performing clinical documentation.  Author: Marsha Ada, MD 06/04/2024 8:00 AM  For on call review [email or web address].

## 2024-06-04 NOTE — Progress Notes (Signed)
 [facility] night cross cover note:   The patient reports chronic back pain for which she conveys that she is on prn tramadol  as an outpatient. Per brief review of the completed outpatient med rec, I do not see at this time and entry for prn tramadol .   However, her vital signs appear stable, including normotensive blood pressures, RR 16, oxygen saturation in the high 90s on 2 L nasal cannula which has de-escalated from 3 to 4 L nasal cannula earlier today.  I subsequently ordered prn tramadol  for her back discomfort.     Eva Pore, DO Hospitalist

## 2024-06-04 NOTE — ED Triage Notes (Signed)
 BIB [facility] to be evaluated to establish wether or not she needed ICU or Progressive care. Pt is awake and alert, responds to questions appropriately. Pt is tolerating BiPap and breathing is WNL.

## 2024-06-04 NOTE — ED Notes (Signed)
 RN into room to check on pt and pt noted to be diaphoretic, tachycardic and drowsy. Pt not waking up to voice. MD to bedside and pt given assisted ventilations.

## 2024-06-04 NOTE — ED Notes (Signed)
 Pt continued on BVM and noted to become more alert and skin warm and dry. Pt able to speak in full sentences and answering questions appropriately.

## 2024-06-04 NOTE — ED Notes (Signed)
Patient has been changed and repositioned.

## 2024-06-04 NOTE — ED Notes (Signed)
 [facility] called for trasnsport.

## 2024-06-04 NOTE — ED Notes (Signed)
 Pt transitioned to 2L [place] by RT. 100% O2.

## 2024-06-04 NOTE — ED Notes (Signed)
 RT placed pt on bipap. Pt tolerating well.

## 2024-06-04 NOTE — ED Provider Notes (Signed)
 Blood pressure (!) 147/82, pulse 92, temperature (!) 97.5 F (36.4 C), temperature source Oral, resp. rate 14, last menstrual period 11/25/1994, SpO2 100%.   In short, Kristine [name] is a 83 y.o. female with a chief complaint of Shortness of Breath .  Refer to the original H&P for additional details.  06:00 AM  Patient arrives from [facility] on BiPAP.  She is tolerating the BiPAP very well.  She is awake, alert.  She is talkative.  She reports feeling much better since starting this machine.  No hypotension. Plan for Hospitalist admit.     Darra Fonda MATSU, MD 06/05/24 [phone]

## 2024-06-05 DIAGNOSIS — J9601 Acute respiratory failure with hypoxia: Secondary | ICD-10-CM | POA: Diagnosis not present

## 2024-06-05 DIAGNOSIS — R7989 Other specified abnormal findings of blood chemistry: Secondary | ICD-10-CM

## 2024-06-05 DIAGNOSIS — R0600 Dyspnea, unspecified: Secondary | ICD-10-CM

## 2024-06-05 LAB — TROPONIN I (HIGH SENSITIVITY)
Troponin I (High Sensitivity): 718 ng/L (ref <18)
Troponin I (High Sensitivity): 629 ng/L (ref <18)

## 2024-06-05 LAB — BASIC METABOLIC PANEL WITH GFR
Anion gap: 8 (ref 5–15)
BUN: 15 mg/dL (ref 8–23)
CO2: 25 mmol/L (ref 22–32)
Calcium: 9.2 mg/dL (ref 8.9–10.3)
Chloride: 104 mmol/L (ref 98–111)
Creatinine, Ser: 0.71 mg/dL (ref 0.44–1.00)
GFR, Estimated: >60 mL/min (ref >60)
Glucose, Bld: 120 mg/dL — ABNORMAL HIGH (ref 70–99)
Potassium: 4.1 mmol/L (ref 3.5–5.1)
Sodium: 137 mmol/L (ref 135–145)

## 2024-06-05 LAB — MAGNESIUM: Magnesium: 2.1 mg/dL (ref 1.7–2.4)

## 2024-06-05 MED ORDER — FUROSEMIDE 10 MG/ML IJ SOLN
40.0000 mg | Freq: Two times a day (BID) | INTRAMUSCULAR | Status: DC
  Administered 2024-06-05 (×2): 40 mg via INTRAVENOUS
  Filled 2024-06-05 (×2): qty 4

## 2024-06-05 MED ORDER — HEPARIN (PORCINE) 25000 UT/250ML-% IV SOLN
750.0000 [IU]/h | INTRAVENOUS | Status: DC
  Administered 2024-06-05: 600 [IU]/h via INTRAVENOUS
  Administered 2024-06-07: 650 [IU]/h via INTRAVENOUS
  Filled 2024-06-05 (×2): qty 250

## 2024-06-05 MED ORDER — HEPARIN BOLUS VIA INFUSION
3000.0000 [IU] | Freq: Once | INTRAVENOUS | Status: AC
  Administered 2024-06-05: 3000 [IU] via INTRAVENOUS
  Filled 2024-06-05: qty 3000

## 2024-06-05 MED ORDER — ONDANSETRON HCL 4 MG/2ML IJ SOLN
4.0000 mg | Freq: Four times a day (QID) | INTRAMUSCULAR | Status: DC | PRN
  Administered 2024-06-05 – 2024-06-08 (×2): 4 mg via INTRAVENOUS
  Filled 2024-06-05 (×2): qty 2

## 2024-06-05 MED ORDER — ENSURE PLUS HIGH PROTEIN PO LIQD
237.0000 mL | Freq: Two times a day (BID) | ORAL | Status: DC
  Administered 2024-06-06 – 2024-06-11 (×5): 237 mL via ORAL

## 2024-06-05 MED ORDER — METHYLPREDNISOLONE SODIUM SUCC 125 MG IJ SOLR
40.0000 mg | INTRAMUSCULAR | Status: DC
  Administered 2024-06-05 – 2024-06-06 (×2): 40 mg via INTRAVENOUS
  Filled 2024-06-05 (×2): qty 2

## 2024-06-05 NOTE — Progress Notes (Signed)
 PHARMACY - ANTICOAGULATION CONSULT NOTE  Pharmacy Consult for Heparin  Indication: chest pain/ACS  Allergies  Allergen Reactions   Nsaids Other (See Comments)    Prone to bleeding   Aspirin  Other (See Comments)    Prone to bleeding OK to take low-dose EC but not aspirin  based medications.    Patient Measurements: Height: 5' (152.4 cm) Weight: 48.8 kg (107 lb 9.6 oz) IBW/kg (Calculated) : 45.5 HEPARIN  DW (KG): 48.8  Vital Signs: Temp: 99.2 F (37.3 C) (07/12 1647) Temp Source: Oral (07/12 1647) BP: 125/69 (07/12 1647) Pulse Rate: 88 (07/12 1214)  Labs: Recent Labs    06/03/24 2351 06/04/24 0026 06/04/24 0140 06/04/24 0238 06/04/24 0755 06/05/24 0430 06/05/24 1444  HGB 10.6*   < > 12.9 11.2* 10.6*  --   --   HCT 35.8*   < > 38.0 33.0* 36.2  --   --   PLT 199  --   --   --  217  --   --   CREATININE 0.72  --   --   --  0.68 0.71  --   TROPONINIHS  --   --   --   --   --   --  718*   < > = values in this interval not displayed.    Estimated Creatinine Clearance: 39.6 mL/min (by C-G formula based on SCr of 0.71 mg/dL).   Medical History: Past Medical History:  Diagnosis Date   Anemia    Arthritis    Asthma    Barrett's esophagus    Stage B   CAD (coronary artery disease)    a. s/p STEMI 7/14 >> LHC: LM 20, mLAD 30-40, small Dx 100 (L-L collats), OM1 90, OM2 30-40, mRCA 50-60, Lat/AL akinesis, EF 35-40% >> PCI: Promus DES to OM1;  b. Myoview 3/16: diaph atten, no ischemia, EF 59% (intermediate risk b/c BP drop 152>>137, but recovery BP 162 (tech error) >> med Rx continued  c. cath 05/30/17 s/p FFR guided PCI with DES to circumflex/first OM    COVID-19 virus infection    CREST variant of scleroderma (HCC)    Cystocele    Diverticulitis    Event monitor 04/2019   Event monitor 04/2019: Sinus rhythm; no atrial fibrillation/flutter; no bradycardic events; few PVCs and occasional ventricular runs up to 9 beats   GERD (gastroesophageal reflux disease)    History of  blood transfusion    hx of being Anti-K positive   History of Doppler ultrasound    a. Carotid US  2/12: bilateral ICA < 50%   Hyperlipidemia    Hypertension    Ischemic cardiomyopathy    a. EF 35-40% by LHC at time of MI;  b. Echo 7/14:  Mild focal basal septal hypertrophy, EF 50-55%, apical HK, grade 1 diastolic dysfunction, trivial AI    Ischemic colitis (HCC)    Orthostatic hypotension    OSA on CPAP    Osteoporosis 12/2017   T score -2.6   Raynaud disease    Rectocele    mild    Medications:  Medications Prior to Admission  Medication Sig Dispense Refill Last Dose/Taking   acetaminophen  (TYLENOL ) 500 MG tablet Take 1,000 mg by mouth every 6 (six) hours as needed for headache or moderate pain (pain score 4-6).   Past Week   albuterol  (VENTOLIN  HFA) 108 (90 Base) MCG/ACT inhaler 1 puff as needed Inhalation every 4 hrs   Past Month   aspirin  EC 81 MG EC tablet Take 1 tablet (  81 mg total) by mouth daily.   06/03/2024 Morning   b complex vitamins capsule Take 1 capsule by mouth daily.   06/03/2024 Morning   famotidine  (PEPCID ) 40 MG tablet Take 40 mg by mouth at bedtime.   Past Week   MAGNESIUM  PO Take 1 tablet by mouth daily.   06/03/2024 Morning   metoprolol  succinate (TOPROL  XL) 25 MG 24 hr tablet Take 0.5 tablets (12.5 mg total) by mouth daily. 45 tablet 3 06/03/2024 Morning   nitroGLYCERIN  (NITROSTAT ) 0.4 MG SL tablet Place 1 tablet (0.4 mg total) under the tongue every 5 (five) minutes x 3 doses as needed for chest pain. 25 tablet 3 Unknown   OLIVE LEAF EXTRACT PO Take 1 capsule by mouth daily.   06/03/2024 Morning   pantoprazole  (PROTONIX ) 40 MG tablet Take 40 mg by mouth daily.   06/03/2024 Morning   pravastatin  (PRAVACHOL ) 20 MG tablet TAKE 1 TABLET BY MOUTH IN THE  EVENING 90 tablet 3 Past Week   QUERCETIN PO Take 1 tablet by mouth daily.   06/03/2024 Morning   Scheduled:   aspirin  EC  81 mg Oral Daily   enoxaparin  (LOVENOX ) injection  40 mg Subcutaneous Q24H   feeding  supplement  237 mL Oral BID BM   fluticasone  furoate-vilanterol  1 puff Inhalation Daily   furosemide   40 mg Intravenous BID   [START ON 06/06/2024] methylPREDNISolone  sodium succinate  40 mg Intravenous Q24H   metoprolol  succinate  12.5 mg Oral Daily   pantoprazole   40 mg Oral Daily   pravastatin   20 mg Oral QPM   Infusions:   Assessment: 83 YO female with PMH of CREST syndrome, CAD, STEMI s/p DES to OM1 in 2014, HLD, HTN, ischemic colitis, Barrett's esophagus, OSA on CPAP, and OA p/w AHRF.   Heparin  consult for ACS management. Hgb 10.6, Hct 36.2, Plt 217. No active bleeding noted per RN.   Goal of Therapy:  Heparin  level 0.3-0.7 units/ml Goal aPTT: 66-102 seconds Monitor platelets by anticoagulation protocol: Yes   Plan:  Give 3000 units bolus x 1 Start heparin  infusion at 600 units/hr Check anti-Xa level in 8 hours and daily while on heparin  Continue to monitor H&H and platelets  R. Samual Satterfield, PharmD PGY-1 Acute Care Pharmacy Resident [facility] 06/05/2024 5:04 PM

## 2024-06-05 NOTE — Progress Notes (Addendum)
 Progress Note   Patient: Kristine [name] FMW:[id number] DOB: 08-Mar-1941 DOA: 06/03/2024     1 DOS: the patient was seen and examined on 06/05/2024   Brief hospital course:  Annagrace Carr is a 83 y.o. female with medical history significant of CREST syndrome, CAD, STEMI s/p DES to OM1 in 2014, HLD, HTN, ischemic colitis, Barrett's esophagus, OSA on CPAP, and OA p/w AHRF.   Pt states that she has not felt well for the past week. She does not attribute her fatigue to any one thing in particular, but became concerned when she had extreme SOB - like gasping for air - yesterday evening at 2200 while watching TV. She did not use any inhalers or medication, and opted instead to call her son who brought her to the [facility].   In the ED, pt tachycardic and hypertensive. Labs notable for WBC 11.9 and VBG 7.131/65 for which placed was placed on BiPAP in ED. RVP neg. CXR showed severely dilated gas-filled esophagus and stomach likely represent achalasia. Pt admitted to medicine for further w/u.   Assessment and Plan:  50F h/o CREST syndrome, CAD, STEMI s/p DES to OM1 in 2014, HLD, HTN, ischemic colitis, Barrett's esophagus, OSA on CPAP, and OA p/w AHRF.   Acute hypoxic respiratory failure High suspicion for ILD iso CREST;  Ddx includes AECOPD given tobacco exposure (pt does not smoke herself, but was married to a PPD smoker for almost a decade) -Duonebs prn for now -IV solumedrol decreased to 40 mg IV daily -Incentive spirometry and flutter valve prn -Patient has been weaned off oxygen and is currently on room air -Ambulatory pulse prior to d/c -CT chest without contrast shows stable dilated esophagus with air-fluid level. Stable 2 mm nodular densities in the lungs. No follow-up needed if patient is low-risk (and has no known or suspected primary neoplasm). Non-contrast chest CT can be considered in 12 months if patient is high-risk. GI consult requested for dilated esophagus with air - fluid level.  Patient was  evaluated due to the dilated fluid-filled esophagus seen on prior imaging. She had an EGD for it 2 months ago. Related to her CREST syndrome and she also has a hiatal hernia. Does not need further w/u, no other therapy available. Keep head of bed elevated to reduce risk of aspiration.     CREST syndrome -OP Rheumatology as planned - Patient has an appointment scheduled for August and has been advised to keep the appointment   Acute diastolic dysfunction CHF Imaging shows bilateral pleural effusions Last 2D echocardiogram shows an LVEF of 50 to 55% with no regional wall motion abnormalities.  LVH of the basal septal segment.  Mild calcification of the aortic valve.   Continue scheduled Lasix   Will start patient on low-dose beta-blocker if blood pressure tolerates    Elevated troponin  Hx of CAD s/p Stent angioplasty Most likely secondary to demand from respiratory distress rule out ACS Initial troponin is 111 Cycle cardiac enzymes 2D echocardiogram is negative for regional wall motion abnormality Continue aspirin , statins and beta-blockers    OSA -PTA CPAP   HTN CAD -PTA metoprolol  XL 12.5mg  daily   HLD -PTA pravastatin         Subjective: Feels better  Physical Exam: Vitals:   06/05/24 0638 06/05/24 0808 06/05/24 0937 06/05/24 1214  BP:  (!) 91/45 110/61 (!) 109/59  Pulse: 65 68 98 88  Resp:  17  19  Temp:  98 F (36.7 C)  98 F (36.7 C)  TempSrc:  Oral  Oral  SpO2: 100% 99% 100% 97%  Weight:      Height:       Vitals and nursing note reviewed.  Constitutional:      Appearance: She is well-developed.  HENT:     Head: Normocephalic and atraumatic.  Cardiovascular:     Rate and Rhythm: Regular rhythm.     Heart sounds: No murmur heard. Pulmonary:     Effort: Pulmonary effort is normal. No respiratory distress.     Comments: Scattered Wheezing bilaterally with transmitted upper airway noises bilaterally Abdominal:     Palpations: Abdomen is soft.      Tenderness: There is no guarding or rebound.     Comments: Distended nontender abdomen.  Musculoskeletal:        General: No tenderness.  Skin:    General: Skin is warm and dry.  Neurological:     Mental Status: She is alert and oriented to person, place, and time.  Psychiatric:        Behavior: Behavior normal.       Data Reviewed: BNP 1207, white count 11.9,  Labs reviewed  Family Communication: Plan of care was discussed with patient and her daughter at the bedside.  They verbalized understanding and agree with the plan.  Disposition: Status is: Inpatient Remains inpatient appropriate because: Requires IV diuretics  Planned Discharge Destination: TBD    Time spent: 45 minutes  Author: Aimee Somerset, MD 06/05/2024 2:18 PM  For on call review [email or web address].

## 2024-06-05 NOTE — Progress Notes (Signed)
 [facility] night cross cover note:   I was notified by the patient's RN that the patient is experiencing some nausea at this time in the absence of any additional symptoms.  I subsequently added as needed IV Zofran  for nausea.     Eva Pore, DO Hospitalist

## 2024-06-05 NOTE — Progress Notes (Signed)
   06/05/24 2132  BiPAP/CPAP/SIPAP  Reason BIPAP/CPAP not in use Non-compliant   Patient declined CPAP at night. States it makes her feel like she's suffocating. Told patient if she changed her mind one would be provided for her.

## 2024-06-05 NOTE — Progress Notes (Signed)
 Patient politely declining the use of bed alarm after education from RN. Patient states that she prefers to use the bedside commode independently despite her fall risk status. Patient stating that she will not ambulate in room without staff assistance, and that she will only be transferring to the bedside commode. Patient is steady on her feet.

## 2024-06-05 NOTE — Consult Note (Incomplete)
 Consultation Note   Referring Provider:  [facility] PCP: Ileen Rosaline NOVAK, NP Primary Gastroenterologist:   [facility]      Reason for Consultation:  Abnormal esophagus on imaging DOA: 06/03/2024         Hospital Day: 3   ASSESSMENT     83 yo female with Barrett's esophagus  / large hiatal hernia ollowed by [facility]. Admitted with respiratory failure ( resolving). In process of evaluation found to have dilated esophagus with air / fluid levels on chest CT scan.   Elevated HS troponin ? Demand from respiratory distress  Acute diastolic CHF Last 2D echocardiogram shows an LVEF of 50 to 55% with no regional wall motion abnormalities. LVH of the basal septal segment. Mild calcification of the aortic valve.   CREST  See PMH for additional history  Principal Problem:   Acute respiratory failure with hypoxia (HCC) Active Problems:   COPD exacerbation (HCC)      PLAN:   ***    HPI   83 y.o. year old female with a medical history including but not limited to CREST syndrome, CAD / STEMI s/p DES in 2014, HLD, HTN, ischemic colitis, Barrett's esophagus, OSA on CPAP.   Presented to ED 7/11 for SHOB CXR was negative for infiltrate but did show severely dilated gas-filled esophagus and stomach likely represent achalasia.  No improvement with Nebs and steroids. Required BiPAP. SABRA HS Troponin was 111 . Non -ischemic EKG.  Lipase 54 Hgb 11.2 Admitted with acute respiratory failure. Improved over last few days with IV steroids and nebulizers.    Chest CT wo contrast 1. Trace bilateral pleural effusions with atelectatic changes in the lingula and bilateral lower lobes. 2. Stable dilated esophagus with air-fluid level. 3. Stable 2 mm nodular densities in the lungs. No follow-up needed if patient is low-risk (and has no known or suspected primary neoplasm).  4. Aortic atherosclerosis   Labs and Imaging:  Recent Labs     06/04/24 0007  PROT 6.7  ALBUMIN 4.3  AST 20  ALT 12  ALKPHOS 87  BILITOT <0.2  BILIDIR <0.1  IBILI NOT CALCULATED   Recent Labs    06/03/24 2351 06/04/24 0026 06/04/24 0140 06/04/24 0238 06/04/24 0755  WBC 9.6  --   --   --  11.9*  HGB 10.6*   < > 12.9 11.2* 10.6*  HCT 35.8*   < > 38.0 33.0* 36.2  MCV 75.1*  --   --   --  75.9*  PLT 199  --   --   --  217   < > = values in this interval not displayed.   Recent Labs    06/03/24 2351 06/04/24 0026 06/04/24 0238 06/04/24 0755 06/05/24 0430  NA 141   < > 141 141 137  K 3.7   < > 3.3* 3.6 4.1  CL 103  --   --  104 104  CO2 23  --   --  25 25  GLUCOSE 175*  --   --  158* 120*  BUN 10  --   --  8 15  CREATININE 0.72  --   --  0.68 0.71  CALCIUM  9.8  --   --  9.8 9.2   < > = values in this interval not displayed.     CT CHEST WO CONTRAST CLINICAL DATA:  Dyspnea.  Chest wall disease suspected.  EXAM: CT CHEST WITHOUT CONTRAST  TECHNIQUE: Multidetector CT imaging of the chest was performed following the standard protocol without IV contrast.  RADIATION DOSE REDUCTION: This exam was performed according to the departmental dose-optimization program which includes automated exposure control, adjustment of the mA and/or kV according to patient size and/or use of iterative reconstruction technique.  COMPARISON:  CT chest 12/13/2023  FINDINGS: Cardiovascular: No significant vascular findings. Normal heart size. No pericardial effusion. There are atherosclerotic calcifications of the aorta. Aberrant right subclavian artery again noted.  Mediastinum/Nodes: Dilated esophagus with air-fluid level again seen. There surgical changes at the gastroesophageal junction. Visualized thyroid  gland within normal limits. No enlarged lymph nodes are identified.  Lungs/Pleura: There are trace bilateral pleural effusions. There are atelectatic changes in the lingula and bilateral lower lobes. There is scarring in both lung  apices. There are few scattered 2 mm nodular densities similar to prior.  Upper Abdomen: No acute findings.  Musculoskeletal: Mild T11 compression deformity is unchanged.  IMPRESSION: 1. Trace bilateral pleural effusions with atelectatic changes in the lingula and bilateral lower lobes. 2. Stable dilated esophagus with air-fluid level. 3. Stable 2 mm nodular densities in the lungs. No follow-up needed if patient is low-risk (and has no known or suspected primary neoplasm). Non-contrast chest CT can be considered in 12 months if patient is high-risk. This recommendation follows the consensus statement: Guidelines for Management of Incidental Pulmonary Nodules Detected on CT Images: From the Fleischner Society 2017; Radiology 2017; 284:228-243. 4. Aortic atherosclerosis.  Aortic Atherosclerosis (ICD10-I70.0).  Electronically Signed   By: Greig Pique M.D.   On: 06/04/2024 22:53 DG Chest Port 1 View CLINICAL DATA:  Shortness of breath and wheezing. Tightness in chest.  EXAM: PORTABLE CHEST 1 VIEW  COMPARISON:  12/12/2023  FINDINGS: Dilated gas filled esophagogastric structure throughout the mediastinum. Appearance suggests marked esophageal dilatation, likely achalasia. Possible hiatal hernia. Similar appearance to previous study. Visualized abdominal stomach is also distended. Heart size and pulmonary vascularity are normal. Lungs are clear. Calcification of the aorta.  IMPRESSION: Severely dilated gas-filled esophagus and stomach likely represent achalasia. No change. Lungs are clear.  Electronically Signed   By: Elsie Gravely M.D.   On: 06/04/2024 00:37    Pertinent GI Studies   EGD 03/26/24 [facility] Done for dysphagia and radiographic findings of an air-fluid level and a possible lesion at the GE junction.   Findings:  Esophagus: Patient noted to have a markedly dilated esophagus starting at 18 cm from the incisors.  Abundant liquid material was suctioned,  and a significant amount of food was noted adherent to the esophageal wall.   Patient's GE junction appeared widely patent at 40 cm from the incisors.  Stomach:  Diffuse, erythematous mucosa was noted in the entire stomach suggestive of gastritis.  Patient noted to have a large hiatal hernia with partial invagination of the stomach into the hernia itself.  No additional abnormalities were found on direct and retroflexed views of the stomach.    Duodenum: The duodenal bulb appeared normal. The remaining visualized portion of the duodenum appeared normal.      Past Medical History:  Diagnosis Date   Anemia    Arthritis    Asthma    Barrett's esophagus    Stage B   CAD (coronary artery disease)  a. s/p STEMI 7/14 >> LHC: LM 20, mLAD 30-40, small Dx 100 (L-L collats), OM1 90, OM2 30-40, mRCA 50-60, Lat/AL akinesis, EF 35-40% >> PCI: Promus DES to OM1;  b. Myoview 3/16: diaph atten, no ischemia, EF 59% (intermediate risk b/c BP drop 152>>137, but recovery BP 162 (tech error) >> med Rx continued  c. cath 05/30/17 s/p FFR guided PCI with DES to circumflex/first OM    COVID-19 virus infection    CREST variant of scleroderma (HCC)    Cystocele    Diverticulitis    Event monitor 04/2019   Event monitor 04/2019: Sinus rhythm; no atrial fibrillation/flutter; no bradycardic events; few PVCs and occasional ventricular runs up to 9 beats   GERD (gastroesophageal reflux disease)    History of blood transfusion    hx of being Anti-K positive   History of Doppler ultrasound    a. Carotid US  2/12: bilateral ICA < 50%   Hyperlipidemia    Hypertension    Ischemic cardiomyopathy    a. EF 35-40% by LHC at time of MI;  b. Echo 7/14:  Mild focal basal septal hypertrophy, EF 50-55%, apical HK, grade 1 diastolic dysfunction, trivial AI    Ischemic colitis (HCC)    Orthostatic hypotension    OSA on CPAP    Osteoporosis 12/2017   T score -2.6   Raynaud disease    Rectocele    mild    Past  Surgical History:  Procedure Laterality Date   ABDOMINAL HYSTERECTOMY  1996   RSO for menorrhagia.   CORONARY ANGIOPLASTY WITH STENT PLACEMENT  06/21/2013   CORONARY PRESSURE/FFR STUDY N/A 05/30/2017   Procedure: Intravascular Pressure Wire/FFR Study;  Surgeon: Wonda Sharper, MD;  Location: [facility] INVASIVE CV LAB;  Service: Cardiovascular;  Laterality: N/A;   CORONARY STENT INTERVENTION N/A 05/30/2017   Procedure: Coronary Stent Intervention;  Surgeon: Wonda Sharper, MD;  Location: [facility] INVASIVE CV LAB;  Service: Cardiovascular;  Laterality: N/A;   FOOT SURGERY  2013   x2   JOINT REPLACEMENT  9/10   rt total   LEFT HEART CATH AND CORONARY ANGIOGRAPHY N/A 05/30/2017   Procedure: Left Heart Cath and Coronary Angiography;  Surgeon: Wonda Sharper, MD;  Location: [facility] INVASIVE CV LAB;  Service: Cardiovascular;  Laterality: N/A;   nissen fundoplication  2006   ROTATOR CUFF REPAIR     TONSILLECTOMY  1950   TOTAL KNEE ARTHROPLASTY     right   TUBAL LIGATION  1980    Family History  Problem Relation Age of Onset   Cancer Mother        Pancreatic Cancer   Hepatitis C Mother    Heart disease Father        congestive heart failure   Heart attack Father    Panic disorder Daughter    Hypertension Daughter    Asthma Maternal Grandmother    Emphysema Maternal Grandmother    Cancer Maternal Grandfather        lung   Stroke Neg Hx     Prior to Admission medications   Medication Sig Start Date End Date Taking? Authorizing Provider  acetaminophen  (TYLENOL ) 500 MG tablet Take 1,000 mg by mouth every 6 (six) hours as needed for headache or moderate pain (pain score 4-6).   Yes [provider]  albuterol  (VENTOLIN  HFA) 108 (90 Base) MCG/ACT inhaler 1 puff as needed Inhalation every 4 hrs 03/14/22  Yes [provider]  aspirin  EC 81 MG EC tablet Take 1 tablet (81 mg  total) by mouth daily. 06/24/13  Yes Barrett, Rhonda G, PA-C  b complex vitamins capsule Take 1 capsule by mouth daily.    Yes [provider]  famotidine  (PEPCID ) 40 MG tablet Take 40 mg by mouth at bedtime.   Yes [provider]  MAGNESIUM  PO Take 1 tablet by mouth daily.   Yes [provider]  metoprolol  succinate (TOPROL  XL) 25 MG 24 hr tablet Take 0.5 tablets (12.5 mg total) by mouth daily. 07/30/23  Yes Weaver, Scott T, PA-C  nitroGLYCERIN  (NITROSTAT ) 0.4 MG SL tablet Place 1 tablet (0.4 mg total) under the tongue every 5 (five) minutes x 3 doses as needed for chest pain. 06/11/23  Yes Weaver, Scott T, PA-C  OLIVE LEAF EXTRACT PO Take 1 capsule by mouth daily.   Yes [provider]  pantoprazole  (PROTONIX ) 40 MG tablet Take 40 mg by mouth daily.   Yes [provider]  pravastatin  (PRAVACHOL ) 20 MG tablet TAKE 1 TABLET BY MOUTH IN THE  EVENING 07/24/23  Yes Wonda Sharper, MD  QUERCETIN PO Take 1 tablet by mouth daily.   Yes [provider]    Current Facility-Administered Medications  Medication Dose Route Frequency Provider Last Rate Last Admin   aspirin  EC tablet 81 mg  81 mg Oral Daily Georgina Basket, MD   81 mg at 06/05/24 9061   enoxaparin  (LOVENOX ) injection 40 mg  40 mg Subcutaneous Q24H Georgina Basket, MD   40 mg at 06/05/24 9060   feeding supplement (ENSURE PLUS HIGH PROTEIN) liquid 237 mL  237 mL Oral BID BM Agbata, Tochukwu, MD       fluticasone  furoate-vilanterol (BREO ELLIPTA ) 100-25 MCG/ACT 1 puff  1 puff Inhalation Daily Georgina Basket, MD   1 puff at 06/04/24 2030   furosemide  (LASIX ) injection 40 mg  40 mg Intravenous BID Georgina Basket, MD   40 mg at 06/05/24 0940   ipratropium-albuterol  (DUONEB) 0.5-2.5 (3) MG/3ML nebulizer solution 3 mL  3 mL Nebulization Q6H PRN Georgina Basket, MD   3 mL at 06/04/24 1321   [START ON 06/06/2024] methylPREDNISolone  sodium succinate (SOLU-MEDROL ) 125 mg/2 mL injection 40 mg  40 mg Intravenous Q24H Agbata, Tochukwu, MD   40 mg at 06/05/24 0940   metoprolol  succinate (TOPROL -XL) 24 hr tablet 12.5 mg  12.5 mg Oral  Daily Georgina Basket, MD   12.5 mg at 06/05/24 9061   pantoprazole  (PROTONIX ) EC tablet 40 mg  40 mg Oral Daily Georgina Basket, MD   40 mg at 06/05/24 9061   pravastatin  (PRAVACHOL ) tablet 20 mg  20 mg Oral QPM Georgina Basket, MD   20 mg at 06/04/24 1840   traMADol  (ULTRAM ) tablet 50 mg  50 mg Oral Q6H PRN Howerter, Justin B, DO   50 mg at 06/04/24 2317    Allergies as of 06/03/2024 - Review Complete 06/03/2024  Allergen Reaction Noted   Nsaids Other (See Comments) 09/03/2011   Aspirin  Other (See Comments) 02/27/2012    Social History   Socioeconomic History   Marital status: Divorced    Spouse name: Not on file   Number of children: Not on file   Years of education: Not on file   Highest education level: Not on file  Occupational History   Not on file  Tobacco Use   Smoking status: Never   Smokeless tobacco: Never  Vaping Use   Vaping status: Never Used  Substance and Sexual Activity   Alcohol use: No    Alcohol/week: 0.0 standard drinks  of alcohol   Drug use: No   Sexual activity: Never    Comment: 1st intercourse 52 yo-1 partner  Other Topics Concern   Not on file  Social History Narrative   Exercise dancing 3 x times weekly for 1-2 hours   Social Drivers of Health   Financial Resource Strain: Not on file  Food Insecurity: No Food Insecurity (06/04/2024)   Hunger Vital Sign    Worried About Running Out of Food in the Last Year: Never true    Ran Out of Food in the Last Year: Never true  Transportation Needs: No Transportation Needs (06/04/2024)   PRAPARE - Administrator, Civil Service (Medical): No    Lack of Transportation (Non-Medical): No  Physical Activity: Not on file  Stress: Stress Concern Present (03/26/2024)   Received from [facility] - Occupational Stress Questionnaire    Feeling of Stress : Rather much  Social Connections: Moderately Integrated (06/04/2024)   Social Connection and Isolation Panel     Frequency of Communication with Friends and Family: More than three times a week    Frequency of Social Gatherings with Friends and Family: Once a week    Attends Religious Services: 1 to 4 times per year    Active Member of [employer] or Organizations: No    Attends Engineer, structural: 1 to 4 times per year    Marital Status: Divorced  Catering manager Violence: Not At Risk (06/04/2024)   Humiliation, Afraid, Rape, and Kick questionnaire    Fear of Current or Ex-Partner: No    Emotionally Abused: No    Physically Abused: No    Sexually Abused: No     Code Status   Code Status: Full Code  Review of Systems: All systems reviewed and negative except where noted in HPI.  Physical Exam: Vital signs in last 24 hours: Temp:  [97.9 F (36.6 C)-99.2 F (37.3 C)] 98 F (36.7 C) (07/12 1214) Pulse Rate:  [65-98] 88 (07/12 1214) Resp:  [16-21] 19 (07/12 1214) BP: (91-137)/(45-77) 109/59 (07/12 1214) SpO2:  [97 %-100 %] 97 % (07/12 1214) Weight:  [48.8 kg] 48.8 kg (07/11 1609) Last BM Date : 06/04/24  General:  Pleasant ***female in NAD Psych:  Cooperative. Normal mood and affect Eyes: Pupils equal Ears:  Normal auditory acuity Nose: No deformity, discharge or lesions Neck:  Supple, no masses felt Lungs:  Clear to auscultation.  Heart:  Regular rate, regular rhythm.  Abdomen:  Soft, nondistended, nontender, active bowel sounds, no masses felt Rectal :  Deferred Msk: Symmetrical without gross deformities.  Neurologic:  Alert, oriented, grossly normal neurologically Extremities : No edema Skin:  Intact without significant lesions.    Intake/Output from previous day: 07/11 0701 - 07/12 0700 In: -  Out: 450 [Urine:450] Intake/Output this shift:  Total I/O In: 600 [P.O.:600] Out: 1800 [Urine:1800]   Vina Dasen, NP-C   06/05/2024, 2:59 PM

## 2024-06-05 NOTE — Plan of Care (Signed)
  Problem: Health Behavior/Discharge Planning: Goal: Ability to manage health-related needs will improve Outcome: Progressing   Problem: Clinical Measurements: Goal: Will remain free from infection Outcome: Progressing Goal: Respiratory complications will improve Outcome: Progressing Goal: Cardiovascular complication will be avoided Outcome: Progressing   Problem: Activity: Goal: Risk for activity intolerance will decrease Outcome: Progressing   Problem: Pain Managment: Goal: General experience of comfort will improve and/or be controlled Outcome: Progressing   Problem: Safety: Goal: Ability to remain free from injury will improve Outcome: Progressing

## 2024-06-05 NOTE — Progress Notes (Signed)
 Lab called RN with critical troponin value 718. Text page via amion sent to Dr. Lanetta at 1539.

## 2024-06-05 NOTE — Consult Note (Signed)
 CARDIOLOGY CONSULT NOTE       Patient ID: Kristine [name] MRN: [id number] DOB/AGE: July 29, 1941 83 y.o.  Admit date: 06/03/2024 Referring Physician: Lanetta Lingo Primary Physician: Ileen Rosaline NOVAK, NP Primary Cardiologist: Wonda Reason for Consultation: Dyspnea  Principal Problem:   Acute respiratory failure with hypoxia (HCC) Active Problems:   SEMI (subendocardial myocardial infarction) (HCC)   COPD exacerbation (HCC)   Dyspnea   HPI:  83 y.o. with history of Crest variant, asthma, achalasia with barrett's esophagus and CAD. Admitted with dyspnea. Describes have paroxysms of dyspnea starting in [place] over winter. Typically did not have them in [place] Starting Wednesday having them again. Describes gasping for breath No associated SSCP, LE edema, syncope, fever sputum or flu like symptoms She has CAD with distant LCX stent with restenosis and repeat PCI DES to same area 05/30/17.  At times of her stents she had crushing SSCP like elephant sitting on chest and she has not had these symptoms. Her CT shows dilated esophagus with air/fluid levels from CREST syndrome. She is at risk for aspiration She also has a moderate hiatal hernia.   Despite lack of chest pain Troponin checked and was 718. ECG shows SR with PVC and ? Subtle lateral T wave changes in V56  CXR just shows severely dilated esophagus / achalasia  Prior TTE 11/20/23 showed EF 50-55% with trivial MR   ROS All other systems reviewed and negative except as noted above  Past Medical History:  Diagnosis Date   Anemia    Arthritis    Asthma    Barrett's esophagus    Stage B   CAD (coronary artery disease)    a. s/p STEMI 7/14 >> LHC: LM 20, mLAD 30-40, small Dx 100 (L-L collats), OM1 90, OM2 30-40, mRCA 50-60, Lat/AL akinesis, EF 35-40% >> PCI: Promus DES to OM1;  b. Myoview 3/16: diaph atten, no ischemia, EF 59% (intermediate risk b/c BP drop 152>>137, but recovery BP 162 (tech error) >> med Rx continued  c. cath  05/30/17 s/p FFR guided PCI with DES to circumflex/first OM    COVID-19 virus infection    CREST variant of scleroderma (HCC)    Cystocele    Diverticulitis    Event monitor 04/2019   Event monitor 04/2019: Sinus rhythm; no atrial fibrillation/flutter; no bradycardic events; few PVCs and occasional ventricular runs up to 9 beats   GERD (gastroesophageal reflux disease)    History of blood transfusion    hx of being Anti-K positive   History of Doppler ultrasound    a. Carotid US  2/12: bilateral ICA < 50%   Hyperlipidemia    Hypertension    Ischemic cardiomyopathy    a. EF 35-40% by LHC at time of MI;  b. Echo 7/14:  Mild focal basal septal hypertrophy, EF 50-55%, apical HK, grade 1 diastolic dysfunction, trivial AI    Ischemic colitis (HCC)    Orthostatic hypotension    OSA on CPAP    Osteoporosis 12/2017   T score -2.6   Raynaud disease    Rectocele    mild    Family History  Problem Relation Age of Onset   Cancer Mother        Pancreatic Cancer   Hepatitis C Mother    Heart disease Father        congestive heart failure   Heart attack Father    Panic disorder Daughter    Hypertension Daughter    Asthma Maternal Grandmother    Emphysema  Maternal Grandmother    Cancer Maternal Grandfather        lung   Stroke Neg Hx     Social History   Socioeconomic History   Marital status: Divorced    Spouse name: Not on file   Number of children: Not on file   Years of education: Not on file   Highest education level: Not on file  Occupational History   Not on file  Tobacco Use   Smoking status: Never   Smokeless tobacco: Never  Vaping Use   Vaping status: Never Used  Substance and Sexual Activity   Alcohol use: No    Alcohol/week: 0.0 standard drinks of alcohol   Drug use: No   Sexual activity: Never    Comment: 1st intercourse 74 yo-1 partner  Other Topics Concern   Not on file  Social History Narrative   Exercise dancing 3 x times weekly for 1-2 hours   Social  Drivers of Corporate investment banker Strain: Not on file  Food Insecurity: No Food Insecurity (06/04/2024)   Hunger Vital Sign    Worried About Running Out of Food in the Last Year: Never true    Ran Out of Food in the Last Year: Never true  Transportation Needs: No Transportation Needs (06/04/2024)   PRAPARE - Administrator, Civil Service (Medical): No    Lack of Transportation (Non-Medical): No  Physical Activity: Not on file  Stress: Stress Concern Present (03/26/2024)   Received from [facility] of Occupational Health - Occupational Stress Questionnaire    Feeling of Stress : Rather much  Social Connections: Moderately Integrated (06/04/2024)   Social Connection and Isolation Panel    Frequency of Communication with Friends and Family: More than three times a week    Frequency of Social Gatherings with Friends and Family: Once a week    Attends Religious Services: 1 to 4 times per year    Active Member of [employer] or Organizations: No    Attends Banker Meetings: 1 to 4 times per year    Marital Status: Divorced  Catering manager Violence: Not At Risk (06/04/2024)   Humiliation, Afraid, Rape, and Kick questionnaire    Fear of Current or Ex-Partner: No    Emotionally Abused: No    Physically Abused: No    Sexually Abused: No    Past Surgical History:  Procedure Laterality Date   ABDOMINAL HYSTERECTOMY  1996   RSO for menorrhagia.   CORONARY ANGIOPLASTY WITH STENT PLACEMENT  06/21/2013   CORONARY PRESSURE/FFR STUDY N/A 05/30/2017   Procedure: Intravascular Pressure Wire/FFR Study;  Surgeon: Wonda Sharper, MD;  Location: [facility] INVASIVE CV LAB;  Service: Cardiovascular;  Laterality: N/A;   CORONARY STENT INTERVENTION N/A 05/30/2017   Procedure: Coronary Stent Intervention;  Surgeon: Wonda Sharper, MD;  Location: [facility] INVASIVE CV LAB;  Service: Cardiovascular;  Laterality: N/A;   FOOT SURGERY  2013   x2   JOINT REPLACEMENT  9/10   rt total    LEFT HEART CATH AND CORONARY ANGIOGRAPHY N/A 05/30/2017   Procedure: Left Heart Cath and Coronary Angiography;  Surgeon: Wonda Sharper, MD;  Location: [facility] INVASIVE CV LAB;  Service: Cardiovascular;  Laterality: N/A;   nissen fundoplication  2006   ROTATOR CUFF REPAIR     TONSILLECTOMY  1950   TOTAL KNEE ARTHROPLASTY     right   TUBAL LIGATION  1980      Current Facility-Administered Medications:  aspirin  EC tablet 81 mg, 81 mg, Oral, Daily, Georgina Basket, MD, 81 mg at 06/05/24 9061   enoxaparin  (LOVENOX ) injection 40 mg, 40 mg, Subcutaneous, Q24H, Georgina Basket, MD, 40 mg at 06/05/24 9060   feeding supplement (ENSURE PLUS HIGH PROTEIN) liquid 237 mL, 237 mL, Oral, BID BM, Agbata, Tochukwu, MD   fluticasone  furoate-vilanterol (BREO ELLIPTA ) 100-25 MCG/ACT 1 puff, 1 puff, Inhalation, Daily, Georgina Basket, MD, 1 puff at 06/04/24 2030   furosemide  (LASIX ) injection 40 mg, 40 mg, Intravenous, BID, Georgina Basket, MD, 40 mg at 06/05/24 0940   ipratropium-albuterol  (DUONEB) 0.5-2.5 (3) MG/3ML nebulizer solution 3 mL, 3 mL, Nebulization, Q6H PRN, Georgina Basket, MD, 3 mL at 06/04/24 1321   [START ON 06/06/2024] methylPREDNISolone  sodium succinate (SOLU-MEDROL ) 125 mg/2 mL injection 40 mg, 40 mg, Intravenous, Q24H, Agbata, Tochukwu, MD, 40 mg at 06/05/24 0940   metoprolol  succinate (TOPROL -XL) 24 hr tablet 12.5 mg, 12.5 mg, Oral, Daily, Georgina Basket, MD, 12.5 mg at 06/05/24 9061   pantoprazole  (PROTONIX ) EC tablet 40 mg, 40 mg, Oral, Daily, Georgina Basket, MD, 40 mg at 06/05/24 9061   pravastatin  (PRAVACHOL ) tablet 20 mg, 20 mg, Oral, QPM, Georgina Basket, MD, 20 mg at 06/04/24 1840   traMADol  (ULTRAM ) tablet 50 mg, 50 mg, Oral, Q6H PRN, Howerter, Justin B, DO, 50 mg at 06/04/24 2317  aspirin  EC  81 mg Oral Daily   enoxaparin  (LOVENOX ) injection  40 mg Subcutaneous Q24H   feeding supplement  237 mL Oral BID BM   fluticasone  furoate-vilanterol  1 puff Inhalation Daily   furosemide   40 mg Intravenous  BID   [START ON 06/06/2024] methylPREDNISolone  sodium succinate  40 mg Intravenous Q24H   metoprolol  succinate  12.5 mg Oral Daily   pantoprazole   40 mg Oral Daily   pravastatin   20 mg Oral QPM     Physical Exam: Blood pressure (!) 109/59, pulse 88, temperature 98 F (36.7 C), temperature source Oral, resp. rate 19, height 5' (1.524 m), weight 48.8 kg, last menstrual period 11/25/1994, SpO2 97%.    Frail elderly female Lungs clear No significant murmur  Abdomen benign No edema  Labs:   Lab Results  Component Value Date   WBC 11.9 (H) 06/04/2024   HGB 10.6 (L) 06/04/2024   HCT 36.2 06/04/2024   MCV 75.9 (L) 06/04/2024   PLT 217 06/04/2024    Recent Labs  Lab 06/04/24 0007 06/04/24 0026 06/05/24 0430  NA  --    < > 137  K  --    < > 4.1  CL  --    < > 104  CO2  --    < > 25  BUN  --    < > 15  CREATININE  --    < > 0.71  CALCIUM   --    < > 9.2  PROT 6.7  --   --   BILITOT <0.2  --   --   ALKPHOS 87  --   --   ALT 12  --   --   AST 20  --   --   GLUCOSE  --    < > 120*   < > = values in this interval not displayed.   Lab Results  Component Value Date   CKTOTAL 44 12/14/2013   TROPONINI <0.30 07/21/2013    Lab Results  Component Value Date   CHOL 152 03/28/2022   CHOL 172 05/24/2021   CHOL 148 04/28/2017   Lab Results  Component  Value Date   HDL 59 03/28/2022   HDL 54 05/24/2021   HDL 45 04/28/2017   Lab Results  Component Value Date   LDLCALC 74 03/28/2022   LDLCALC 90 05/24/2021   LDLCALC 78 04/28/2017   Lab Results  Component Value Date   TRIG 102 03/28/2022   TRIG 189 (H) 05/24/2021   TRIG 126 04/28/2017   Lab Results  Component Value Date   CHOLHDL 2.6 03/28/2022   CHOLHDL 3.2 05/24/2021   CHOLHDL 3.3 04/28/2017   No results found for: LDLDIRECT    Radiology: CT CHEST WO CONTRAST Result Date: 06/04/2024 CLINICAL DATA:  Dyspnea.  Chest wall disease suspected. EXAM: CT CHEST WITHOUT CONTRAST TECHNIQUE: Multidetector CT imaging of  the chest was performed following the standard protocol without IV contrast. RADIATION DOSE REDUCTION: This exam was performed according to the departmental dose-optimization program which includes automated exposure control, adjustment of the mA and/or kV according to patient size and/or use of iterative reconstruction technique. COMPARISON:  CT chest 12/13/2023 FINDINGS: Cardiovascular: No significant vascular findings. Normal heart size. No pericardial effusion. There are atherosclerotic calcifications of the aorta. Aberrant right subclavian artery again noted. Mediastinum/Nodes: Dilated esophagus with air-fluid level again seen. There surgical changes at the gastroesophageal junction. Visualized thyroid  gland within normal limits. No enlarged lymph nodes are identified. Lungs/Pleura: There are trace bilateral pleural effusions. There are atelectatic changes in the lingula and bilateral lower lobes. There is scarring in both lung apices. There are few scattered 2 mm nodular densities similar to prior. Upper Abdomen: No acute findings. Musculoskeletal: Mild T11 compression deformity is unchanged. IMPRESSION: 1. Trace bilateral pleural effusions with atelectatic changes in the lingula and bilateral lower lobes. 2. Stable dilated esophagus with air-fluid level. 3. Stable 2 mm nodular densities in the lungs. No follow-up needed if patient is low-risk (and has no known or suspected primary neoplasm). Non-contrast chest CT can be considered in 12 months if patient is high-risk. This recommendation follows the consensus statement: Guidelines for Management of Incidental Pulmonary Nodules Detected on CT Images: From the Fleischner Society 2017; Radiology 2017; 284:228-243. 4. Aortic atherosclerosis. Aortic Atherosclerosis (ICD10-I70.0). Electronically Signed   By: Greig Pique M.D.   On: 06/04/2024 22:53   DG Chest Port 1 View Result Date: 06/04/2024 CLINICAL DATA:  Shortness of breath and wheezing. Tightness in  chest. EXAM: PORTABLE CHEST 1 VIEW COMPARISON:  12/12/2023 FINDINGS: Dilated gas filled esophagogastric structure throughout the mediastinum. Appearance suggests marked esophageal dilatation, likely achalasia. Possible hiatal hernia. Similar appearance to previous study. Visualized abdominal stomach is also distended. Heart size and pulmonary vascularity are normal. Lungs are clear. Calcification of the aorta. IMPRESSION: Severely dilated gas-filled esophagus and stomach likely represent achalasia. No change. Lungs are clear. Electronically Signed   By: Elsie Gravely M.D.   On: 06/04/2024 00:37    EKG: See HPI   ASSESSMENT AND PLAN:   Elevated Troponin: in setting of paroxysms of dyspnea. And prior stenting of LCX most recently 2018. She had angina at that time and has none now. Cycle enzymes and follow ECG Has subtle lateral T wave changes on ECG today with PVC. Start heparin . Check TTE. ? If transient LCX ischemia could cause ischemic MR. No significant murmur on exam. Discussed possibility of needing repeat heart cath on Monday  Rx ASA and Toprol  along with heparin   Dyspnea;  may be anginal equivalent or transient valvular regurgitation from ischemia but doubt. Certainly her achalasia with air fluid levels and moderate hiatal hernia can  contribute and also increase her risk of aspiration He BNP is elevated and plan on checking TTE see above She has been written for bid iv lasix .   SignedBETHA Maude Emmer 06/05/2024, 4:39 PM

## 2024-06-06 ENCOUNTER — Other Ambulatory Visit (HOSPITAL_COMMUNITY)

## 2024-06-06 ENCOUNTER — Inpatient Hospital Stay (HOSPITAL_COMMUNITY)

## 2024-06-06 DIAGNOSIS — I5033 Acute on chronic diastolic (congestive) heart failure: Secondary | ICD-10-CM | POA: Insufficient documentation

## 2024-06-06 DIAGNOSIS — I214 Non-ST elevation (NSTEMI) myocardial infarction: Secondary | ICD-10-CM

## 2024-06-06 DIAGNOSIS — R55 Syncope and collapse: Secondary | ICD-10-CM | POA: Insufficient documentation

## 2024-06-06 LAB — CBC
HCT: 34.3 % — ABNORMAL LOW (ref 36.0–46.0)
Hemoglobin: 10.2 g/dL — ABNORMAL LOW (ref 12.0–15.0)
MCH: 22.3 pg — ABNORMAL LOW (ref 26.0–34.0)
MCHC: 29.7 g/dL — ABNORMAL LOW (ref 30.0–36.0)
MCV: 74.9 fL — ABNORMAL LOW (ref 80.0–100.0)
Platelets: 240 K/uL (ref 150–400)
RBC: 4.58 MIL/uL (ref 3.87–5.11)
RDW: 19 % — ABNORMAL HIGH (ref 11.5–15.5)
WBC: 14.7 K/uL — ABNORMAL HIGH (ref 4.0–10.5)
nRBC: 0 % (ref 0.0–0.2)

## 2024-06-06 LAB — BASIC METABOLIC PANEL WITH GFR
Anion gap: 9 (ref 5–15)
BUN: 28 mg/dL — ABNORMAL HIGH (ref 8–23)
CO2: 31 mmol/L (ref 22–32)
Calcium: 9.4 mg/dL (ref 8.9–10.3)
Chloride: 99 mmol/L (ref 98–111)
Creatinine, Ser: 0.66 mg/dL (ref 0.44–1.00)
GFR, Estimated: >60 mL/min
Glucose, Bld: 133 mg/dL — ABNORMAL HIGH (ref 70–99)
Potassium: 4 mmol/L (ref 3.5–5.1)
Sodium: 139 mmol/L (ref 135–145)

## 2024-06-06 LAB — HEPARIN LEVEL (UNFRACTIONATED)
Heparin Unfractionated: 0.54 [IU]/mL (ref 0.30–0.70)
Heparin Unfractionated: 0.32 [IU]/mL (ref 0.30–0.70)

## 2024-06-06 LAB — TROPONIN I (HIGH SENSITIVITY): Troponin I (High Sensitivity): 546 ng/L (ref <18)

## 2024-06-06 LAB — MAGNESIUM: Magnesium: 2.2 mg/dL (ref 1.7–2.4)

## 2024-06-06 LAB — GLUCOSE, CAPILLARY: Glucose-Capillary: 141 mg/dL — ABNORMAL HIGH (ref 70–99)

## 2024-06-06 MED ORDER — SODIUM CHLORIDE 0.9 % IV SOLN
INTRAVENOUS | Status: DC
  Administered 2024-06-06: 500 mL via INTRAVENOUS

## 2024-06-06 NOTE — Progress Notes (Signed)
 PHARMACY - ANTICOAGULATION CONSULT NOTE  Pharmacy Consult for IV heparin  Indication: chest pain/ACS  Allergies  Allergen Reactions   Nsaids Other (See Comments)    Prone to bleeding   Aspirin  Other (See Comments)    Prone to bleeding OK to take low-dose EC but not aspirin  based medications.    Patient Measurements: Height: 5' (152.4 cm) Weight: 48.8 kg (107 lb 9.6 oz) IBW/kg (Calculated) : 45.5 HEPARIN  DW (KG): 48.8  Vital Signs: Temp: 98 [name] (36.7 C) (07/13 1218) Temp Source: Oral (07/13 1218) BP: 98/70 (07/13 1218) Pulse Rate: 93 (07/13 1218)  Labs: Recent Labs    06/03/24 2351 06/04/24 0026 06/04/24 0238 06/04/24 0755 06/05/24 0430 06/05/24 1444 06/05/24 1640 06/06/24 0209 06/06/24 0502 06/06/24 1231  HGB 10.6*   < > 11.2* 10.6*  --   --   --  10.2*  --   --   HCT 35.8*   < > 33.0* 36.2  --   --   --  34.3*  --   --   PLT 199  --   --  217  --   --   --  240  --   --   HEPARINUNFRC  --   --   --   --   --   --   --  0.54  --  0.32  CREATININE 0.72  --   --  0.68 0.71  --   --   --   --   --   TROPONINIHS  --   --   --   --   --  718* 629*  --  546*  --    < > = values in this interval not displayed.    Estimated Creatinine Clearance: 39.6 mL/min (by C-G formula based on SCr of 0.71 mg/dL).   Medical History: Past Medical History:  Diagnosis Date   Anemia    Arthritis    Asthma    Barrett's esophagus    Stage B   CAD (coronary artery disease)    a. s/p STEMI 7/14 >> LHC: LM 20, mLAD 30-40, small Dx 100 (L-L collats), OM1 90, OM2 30-40, mRCA 50-60, Lat/AL akinesis, EF 35-40% >> PCI: Promus DES to OM1;  b. Myoview 3/16: diaph atten, no ischemia, EF 59% (intermediate risk b/c BP drop 152>>137, but recovery BP 162 (tech error) >> med Rx continued  c. cath 05/30/17 s/p FFR guided PCI with DES to circumflex/first OM    COVID-19 virus infection    CREST variant of scleroderma (HCC)    Cystocele    Diverticulitis    Event monitor 04/2019   Event monitor  04/2019: Sinus rhythm; no atrial fibrillation/flutter; no bradycardic events; few PVCs and occasional ventricular runs up to 9 beats   GERD (gastroesophageal reflux disease)    History of blood transfusion    hx of being Anti-K positive   History of Doppler ultrasound    a. Carotid US  2/12: bilateral ICA < 50%   Hyperlipidemia    Hypertension    Ischemic cardiomyopathy    a. EF 35-40% by LHC at time of MI;  b. Echo 7/14:  Mild focal basal septal hypertrophy, EF 50-55%, apical HK, grade 1 diastolic dysfunction, trivial AI    Ischemic colitis (HCC)    Orthostatic hypotension    OSA on CPAP    Osteoporosis 12/2017   T score -2.6   Raynaud disease    Rectocele    mild    Medications:  Scheduled:   aspirin  EC  81 mg Oral Daily   feeding supplement  237 mL Oral BID BM   fluticasone  furoate-vilanterol  1 puff Inhalation Daily   methylPREDNISolone  sodium succinate  40 mg Intravenous Q24H   pantoprazole   40 mg Oral Daily   pravastatin   20 mg Oral QPM   Infusions:   heparin  600 Units/hr (06/05/24 1800)    Assessment: 83 YO [name] with PMHx CAD, STEMI s/p DES to OM1 in 2014, HLD, HTN presenting with SOB. BNP 1270.9; Troponin +. Pharmacy consulted to dose heparin  for ACS.  -Possible cath 7/14 -CBC stable -No AC PTA -2nd heparin  level low end of therapeutic range at 0.32 on 7/13 at 12:30  Goal of Therapy:  Heparin  level 0.3-0.7 units/ml Monitor platelets by anticoagulation protocol: Yes   Plan:  -Increase heparin  drip to 650 units/h given low end of therapeutic range and downtrend in level from previous (0.54). -Continue to monitor heparin  level and CBC daily.  Maurilio Patten, PharmD PGY1 Pharmacy Resident [facility]  06/06/2024 1:52 PM

## 2024-06-06 NOTE — Progress Notes (Signed)
 At 06:15, assisted pt to bedside commode. While on the bedside commode pt reported:I'm feeling hot and sweating. Patient passed out and was unresponsive. Heart rate dropped to 29 bpm. Notified rapid response RN but was unable to come at the bedside due to ongoing medical emergency.Charge nurse at the bedside. At 06:18, pt regained consciousness and V/S checked. BP:125/68,HR:79,o2 sat:100% at 2L [place].CBG checked :141. Pt is alert and oriented to self, place and situation but disoriented to time. Pt assisted back to bed safely, bed alarm set, call light within reach. Dr Marcene has notified.

## 2024-06-06 NOTE — Progress Notes (Signed)
 [facility] night cross cover note:   I was notified by the patient's RN that the patient has demonstrated two separate 4 beat runs of nonsustained V. tach earlier in tonight's shift.  The patient was asymptomatic at the time, including no report of associated chest pain.  Vital signs appear stable, including afebrile, systolic blood pressures in the 120s mmHg, respiratory rate 18, and oxygen saturation 96% on room air.  Per chart review, the patient is here with NSTEMI.  Cardiology is following, and plan includes left-sided heart cath tomorrow (7/14).   Additionally, in the setting of a episode of vasovagal syncope on the morning of 06/06/2024, the patient's home metoprolol  succinate 12.5 mg p.o. daily is currently being held.  At the present time, I repeated her BMP and magnesium  levels, with ensuing results notable for the following: Potassium 4.0 and magnesium  level 2.2.   As the patient appears hemodynamically stable and currently asymptomatic, will continue to monitor.     Eva Pore, DO Hospitalist

## 2024-06-06 NOTE — Progress Notes (Signed)
 [facility] night cross cover note:   I was notified by the patient's RN that the patient had an episode of syncope around 615 this morning patient was sitting on the bedside commode, at which time she developed some diaphoresis, lightheadedness, bradycardia down to HR of 29, before losing consciousness.  Not associate with any trauma.  Can radiate into consciousness within 2 minutes of that episode without any ensuing confusion or postictal state.  Patient was able to give a minute aside from the diaphoresis, lightheadedness otherwise asymptomatic leading up to this episode.   After regaining consciousness, her most recent vital signs were notable for the following: Blood pressure 125/68, with heart rates in the 70s to 80s, and oxygen saturation noted to be 100% on 2 L nasal cannula.  It does not appear that there was any objective desaturation noted, and that she had been maintaining oxygen saturations in the mid to high 90's overnight while on room air.  CBG at the time of the episode of loss of consciousness was noted to be 141.  She has subsequently been assisted back to bed and is now reported to be resting comfortably.  I subsequently ordered a stat EKG as well as a stat chest x-ray to further evaluate.    Eva Pore, DO Hospitalist

## 2024-06-06 NOTE — Progress Notes (Signed)
 PROGRESS NOTE    Kristine [name]  FMW:[id number] DOB: 1941-04-21 DOA: 06/03/2024 PCP: Ileen Rosaline NOVAK, NP     Brief Narrative:   Kristine [name] is a 83 y.o. female with medical history significant of CREST syndrome, CAD, STEMI s/p DES to OM1 in 2014, HLD, HTN, ischemic colitis, Barrett's esophagus, OSA on CPAP, and OA presented with shortness of breath.  New events last 24 hours / Subjective: Had episode of syncope early this morning.  She reports that she was at the bedside commode, urinated, then developed flushing.  She became diaphoretic, lightheaded, had bradycardia down to heart rate of 29 before losing consciousness.  She recalls being back in bed and is able to relate the events that happened this morning.  Upon returning back to bed, her blood pressure and heart rate returned back to normal.  Blood sugar at this time was normal 141.  She had gotten Lasix  and metoprolol  7/12.  Assessment & Plan:   Principal Problem:   NSTEMI (non-ST elevated myocardial infarction) (HCC) Active Problems:   Hypertension   CAD (coronary artery disease), native coronary artery   Hyperlipidemia   OSA on CPAP   Cr(e)st syndrome (HCC)   Acute respiratory failure with hypoxia (HCC)   COPD exacerbation (HCC)   Dyspnea   Syncope, vasovagal   Acute on chronic diastolic CHF (congestive heart failure) (HCC)    NSTEMI History of CAD status post stent placement  - Troponin 718, 629, 546 - Cardiology following - Echocardiogram is pending - IV heparin , aspirin   - Heart cath planned  Vasovagal syncope - Currently holding metoprolol  and Lasix .  Give very gentle fluids 500 cc over 5-hour period and stop - Orthostatic vital signs this afternoon  Acute hypoxemic respiratory failure - IV Solu-Medrol , Breo, duoneb   Acute on chronic diastolic heart failure - Currently holding metoprolol  and Lasix  given her syncopal episode  Achalasia - CT showed stable dilated esophagus with air-fluid level - GI  consulted 7/12, final recommendation is pending but sounds like they are not planning any further workup in the hospital.  She does have an outpatient GI doctor follow-up in August  CREST syndrome -Outpatient rheumatology appointment in August  OSA - CPAP nightly  Hyperlipidemia - Pravastatin   Stable 2 mm lung nodules - Outpatient follow-up    DVT prophylaxis: IV heparin  Code Status: Full code Family Communication: at bedside Disposition Plan: Home Status is: Inpatient Remains inpatient appropriate because: IV heparin , heart cath pending tomorrow    Antimicrobials:  Anti-infectives (From admission, onward)    Start     Dose/Rate Route Frequency Ordered Stop   06/05/24 0800  cefTRIAXone  (ROCEPHIN ) 2 g in sodium chloride  0.9 % 100 mL IVPB  Status:  Discontinued        2 g 200 mL/hr over 30 Minutes Intravenous Every 24 hours 06/04/24 0756 06/04/24 1430        Objective: Vitals:   06/06/24 0744 06/06/24 0751 06/06/24 0824 06/06/24 1218  BP: (!) 84/48 (!) 82/48 (!) 98/45 98/70  Pulse:    93  Resp:    17  Temp:    98 F (36.7 C)  TempSrc:    Oral  SpO2:    100%  Weight:      Height:        Intake/Output Summary (Last 24 hours) at 06/06/2024 1343 Last data filed at 06/06/2024 0900 Gross per 24 hour  Intake 344.69 ml  Output 700 ml  Net -355.31 ml   [employer]  06/04/24 1609  Weight: 48.8 kg    Examination:  General exam: Appears calm and comfortable  Respiratory system: Clear to auscultation. Respiratory effort normal. No respiratory distress. No conversational dyspnea.  Cardiovascular system: S1 & S2 heard, RRR. No murmurs. No pedal edema. Gastrointestinal system: Abdomen is nondistended, soft and nontender. Normal bowel sounds heard. Central nervous system: Alert and oriented. No focal neurological deficits. Speech clear.  Extremities: Symmetric in appearance  Skin: No rashes, lesions or ulcers on exposed skin  Psychiatry: Judgement and insight  appear normal. Mood & affect appropriate.   Data Reviewed: I have personally reviewed following labs and imaging studies  CBC: Recent Labs  Lab 06/03/24 2351 06/04/24 0026 06/04/24 0140 06/04/24 0238 06/04/24 0755 06/06/24 0209  WBC 9.6  --   --   --  11.9* 14.7*  HGB 10.6* 10.9* 12.9 11.2* 10.6* 10.2*  HCT 35.8* 32.0* 38.0 33.0* 36.2 34.3*  MCV 75.1*  --   --   --  75.9* 74.9*  PLT 199  --   --   --  217 240   Basic Metabolic Panel: Recent Labs  Lab 06/03/24 2351 06/04/24 0026 06/04/24 0140 06/04/24 0238 06/04/24 0755 06/05/24 0430  NA 141 139 141 141 141 137  K 3.7 4.7 4.2 3.3* 3.6 4.1  CL 103  --   --   --  104 104  CO2 23  --   --   --  25 25  GLUCOSE 175*  --   --   --  158* 120*  BUN 10  --   --   --  8 15  CREATININE 0.72  --   --   --  0.68 0.71  CALCIUM  9.8  --   --   --  9.8 9.2  MG  --   --   --   --   --  2.1   GFR: Estimated Creatinine Clearance: 39.6 mL/min (by C-G formula based on SCr of 0.71 mg/dL). Liver Function Tests: Recent Labs  Lab 06/04/24 0007  AST 20  ALT 12  ALKPHOS 87  BILITOT <0.2  PROT 6.7  ALBUMIN 4.3   Recent Labs  Lab 06/04/24 0007  LIPASE 54*   No results for input(s): AMMONIA in the last 168 hours. Coagulation Profile: No results for input(s): INR, PROTIME in the last 168 hours. Cardiac Enzymes: No results for input(s): CKTOTAL, CKMB, CKMBINDEX, TROPONINI in the last 168 hours. BNP (last 3 results) No results for input(s): PROBNP in the last 8760 hours. HbA1C: No results for input(s): HGBA1C in the last 72 hours. CBG: Recent Labs  Lab 06/04/24 0130 06/06/24 0624  GLUCAP 165* 141*   Lipid Profile: No results for input(s): CHOL, HDL, LDLCALC, TRIG, CHOLHDL, LDLDIRECT in the last 72 hours. Thyroid  Function Tests: No results for input(s): TSH, T4TOTAL, FREET4, T3FREE, THYROIDAB in the last 72 hours. Anemia Panel: No results for input(s): VITAMINB12, FOLATE,  FERRITIN, TIBC, IRON , RETICCTPCT in the last 72 hours. Sepsis Labs: No results for input(s): PROCALCITON, LATICACIDVEN in the last 168 hours.  Recent Results (from the past 240 hours)  Respiratory (~20 pathogens) panel by PCR     Status: None   Collection Time: 06/04/24  7:57 AM   Specimen: Nasopharyngeal Swab; Respiratory  Result Value Ref Range Status   Adenovirus NOT DETECTED NOT DETECTED Final   Coronavirus 229E NOT DETECTED NOT DETECTED Final    Comment: (NOTE) The Coronavirus on the Respiratory Panel, DOES NOT test for the novel  Coronavirus (2019 nCoV)    Coronavirus HKU1 NOT DETECTED NOT DETECTED Final   Coronavirus NL63 NOT DETECTED NOT DETECTED Final   Coronavirus OC43 NOT DETECTED NOT DETECTED Final   Metapneumovirus NOT DETECTED NOT DETECTED Final   Rhinovirus / Enterovirus NOT DETECTED NOT DETECTED Final   Influenza A NOT DETECTED NOT DETECTED Final   Influenza B NOT DETECTED NOT DETECTED Final   Parainfluenza Virus 1 NOT DETECTED NOT DETECTED Final   Parainfluenza Virus 2 NOT DETECTED NOT DETECTED Final   Parainfluenza Virus 3 NOT DETECTED NOT DETECTED Final   Parainfluenza Virus 4 NOT DETECTED NOT DETECTED Final   Respiratory Syncytial Virus NOT DETECTED NOT DETECTED Final   Bordetella pertussis NOT DETECTED NOT DETECTED Final   Bordetella Parapertussis NOT DETECTED NOT DETECTED Final   Chlamydophila pneumoniae NOT DETECTED NOT DETECTED Final   Mycoplasma pneumoniae NOT DETECTED NOT DETECTED Final    Comment: Performed at [facility], [address] [place], [place] [address]      Radiology Studies: DG Chest Port 1 View Result Date: 06/06/2024 EXAM: 1 VIEW XRAY OF THE CHEST 06/06/2024 07:40:35 AM COMPARISON: 1 view chest x-ray and CT chest without contrast 06/04/2024. CLINICAL HISTORY: Syncope. FINDINGS: LUNGS AND PLEURA: Changes of COPD are present. No focal pulmonary opacity. No pulmonary edema. No pleural effusion. No pneumothorax. HEART AND  MEDIASTINUM: The heart is enlarged. Atherosclerotic changes are present at the aortic arch. Dilated esophagus is again noted. BONES AND SOFT TISSUES: No acute osseous abnormality. IMPRESSION: 1. No acute findings. 2. Cardiomegaly without failure. 3. Changes of COPD. 4. Atherosclerotic changes at the aortic arch. 5. Dilated esophagus. Electronically signed by: Lonni Necessary MD 06/06/2024 07:45 AM EDT RP Workstation: [id number]   CT CHEST WO CONTRAST Result Date: 06/04/2024 CLINICAL DATA:  Dyspnea.  Chest wall disease suspected. EXAM: CT CHEST WITHOUT CONTRAST TECHNIQUE: Multidetector CT imaging of the chest was performed following the standard protocol without IV contrast. RADIATION DOSE REDUCTION: This exam was performed according to the departmental dose-optimization program which includes automated exposure control, adjustment of the mA and/or kV according to patient size and/or use of iterative reconstruction technique. COMPARISON:  CT chest 12/13/2023 FINDINGS: Cardiovascular: No significant vascular findings. Normal heart size. No pericardial effusion. There are atherosclerotic calcifications of the aorta. Aberrant right subclavian artery again noted. Mediastinum/Nodes: Dilated esophagus with air-fluid level again seen. There surgical changes at the gastroesophageal junction. Visualized thyroid  gland within normal limits. No enlarged lymph nodes are identified. Lungs/Pleura: There are trace bilateral pleural effusions. There are atelectatic changes in the lingula and bilateral lower lobes. There is scarring in both lung apices. There are few scattered 2 mm nodular densities similar to prior. Upper Abdomen: No acute findings. Musculoskeletal: Mild T11 compression deformity is unchanged. IMPRESSION: 1. Trace bilateral pleural effusions with atelectatic changes in the lingula and bilateral lower lobes. 2. Stable dilated esophagus with air-fluid level. 3. Stable 2 mm nodular densities in the lungs. No  follow-up needed if patient is low-risk (and has no known or suspected primary neoplasm). Non-contrast chest CT can be considered in 12 months if patient is high-risk. This recommendation follows the consensus statement: Guidelines for Management of Incidental Pulmonary Nodules Detected on CT Images: From the Fleischner Society 2017; Radiology 2017; 284:228-243. 4. Aortic atherosclerosis. Aortic Atherosclerosis (ICD10-I70.0). Electronically Signed   By: Greig Pique M.D.   On: 06/04/2024 22:53      Scheduled Meds:  aspirin  EC  81 mg Oral Daily   feeding supplement  237  mL Oral BID BM   fluticasone  furoate-vilanterol  1 puff Inhalation Daily   methylPREDNISolone  sodium succinate  40 mg Intravenous Q24H   pantoprazole   40 mg Oral Daily   pravastatin   20 mg Oral QPM   Continuous Infusions:  sodium chloride  500 mL (06/06/24 0816)   heparin  600 Units/hr (06/05/24 1800)     LOS: 2 days   Time spent: 35 minutes   Delon Hoe, DO [facility] 06/06/2024, 1:43 PM   Available via Epic secure chat 7am-7pm After these hours, please refer to coverage provider listed on [email or web address]

## 2024-06-06 NOTE — Plan of Care (Signed)
  Problem: Skin Integrity: Goal: Risk for impaired skin integrity will decrease Outcome: Progressing   Problem: Safety: Goal: Ability to remain free from injury will improve Outcome: Progressing   Problem: Pain Managment: Goal: General experience of comfort will improve and/or be controlled Outcome: Progressing   Problem: Elimination: Goal: Will not experience complications related to bowel motility Outcome: Progressing Goal: Will not experience complications related to urinary retention Outcome: Progressing   Problem: Activity: Goal: Risk for activity intolerance will decrease Outcome: Progressing   Problem: Nutrition: Goal: Adequate nutrition will be maintained Outcome: Progressing

## 2024-06-06 NOTE — Progress Notes (Signed)
 PHARMACY - ANTICOAGULATION  Pharmacy Consult for Heparin  Indication: chest pain/ACS Brief A/P: Heparin  level within goal range Continue Heparin  at current rate   Allergies  Allergen Reactions   Nsaids Other (See Comments)    Prone to bleeding   Aspirin  Other (See Comments)    Prone to bleeding OK to take low-dose EC but not aspirin  based medications.    Patient Measurements: Height: 5' (152.4 cm) Weight: 48.8 kg (107 lb 9.6 oz) IBW/kg (Calculated) : 45.5 HEPARIN  DW (KG): 48.8  Vital Signs: Temp: 98.1 F (36.7 C) (07/13 0022) Temp Source: Oral (07/13 0022) BP: 99/59 (07/13 0022) Pulse Rate: 92 (07/13 0022)  Labs: Recent Labs    06/03/24 2351 06/04/24 0026 06/04/24 0238 06/04/24 0755 06/05/24 0430 06/05/24 1444 06/05/24 1640 06/06/24 0209  HGB 10.6*   < > 11.2* 10.6*  --   --   --  10.2*  HCT 35.8*   < > 33.0* 36.2  --   --   --  34.3*  PLT 199  --   --  217  --   --   --  240  HEPARINUNFRC  --   --   --   --   --   --   --  0.54  CREATININE 0.72  --   --  0.68 0.71  --   --   --   TROPONINIHS  --   --   --   --   --  718* 629*  --    < > = values in this interval not displayed.    Estimated Creatinine Clearance: 39.6 mL/min (by C-G formula based on SCr of 0.71 mg/dL).  Assessment: 83 y.o. female with SOB and elevated troponin for heparin    Goal of Therapy:  Heparin  level 0.3-0.7 units/ml Monitor platelets by anticoagulation protocol: Yes   Plan:  No change to heparin   Cathlyn Arrant, PharmD, BCPS  06/06/2024 2:56 AM

## 2024-06-06 NOTE — Progress Notes (Signed)
 Cardiologist:  Kristine [name]  Subjective:  Denies SSCP, palpitations some dyspnea and dysphagia   Objective:  Vitals:   06/06/24 0652 06/06/24 0728 06/06/24 0744 06/06/24 0751  BP:  (!) 91/44 (!) 84/48 (!) 82/48  Pulse: 81 68    Resp:  15    Temp:  98 F (36.7 C)    TempSrc:  Oral    SpO2:  100%    Weight:      Height:        Intake/Output from previous day:  Intake/Output Summary (Last 24 hours) at 06/06/2024 0856 Last data filed at 06/06/2024 0630 Gross per 24 hour  Intake 344.69 ml  Output 2500 ml  Net -2155.31 ml    Physical Exam: Frail elderly female Lungs clear No significant murmur  Abdomen benign No edema   Lab Results: Basic Metabolic Panel: Recent Labs    06/04/24 0755 06/05/24 0430  NA 141 137  K 3.6 4.1  CL 104 104  CO2 25 25  GLUCOSE 158* 120*  BUN 8 15  CREATININE 0.68 0.71  CALCIUM  9.8 9.2  MG  --  2.1   Liver Function Tests: Recent Labs    06/04/24 0007  AST 20  ALT 12  ALKPHOS 87  BILITOT <0.2  PROT 6.7  ALBUMIN 4.3   Recent Labs    06/04/24 0007  LIPASE 54*   CBC: Recent Labs    06/04/24 0755 06/06/24 0209  WBC 11.9* 14.7*  HGB 10.6* 10.2*  HCT 36.2 34.3*  MCV 75.9* 74.9*  PLT 217 240    Imaging: DG Chest Port 1 View Result Date: 06/06/2024 EXAM: 1 VIEW XRAY OF THE CHEST 06/06/2024 07:40:35 AM COMPARISON: 1 view chest x-ray and CT chest without contrast 06/04/2024. CLINICAL HISTORY: Syncope. FINDINGS: LUNGS AND PLEURA: Changes of COPD are present. No focal pulmonary opacity. No pulmonary edema. No pleural effusion. No pneumothorax. HEART AND MEDIASTINUM: The heart is enlarged. Atherosclerotic changes are present at the aortic arch. Dilated esophagus is again noted. BONES AND SOFT TISSUES: No acute osseous abnormality. IMPRESSION: 1. No acute findings. 2. Cardiomegaly without failure. 3. Changes of COPD. 4. Atherosclerotic changes at the aortic arch. 5. Dilated esophagus. Electronically signed by: Lonni Necessary MD  06/06/2024 07:45 AM EDT RP Workstation: [id number]   CT CHEST WO CONTRAST Result Date: 06/04/2024 CLINICAL DATA:  Dyspnea.  Chest wall disease suspected. EXAM: CT CHEST WITHOUT CONTRAST TECHNIQUE: Multidetector CT imaging of the chest was performed following the standard protocol without IV contrast. RADIATION DOSE REDUCTION: This exam was performed according to the departmental dose-optimization program which includes automated exposure control, adjustment of the mA and/or kV according to patient size and/or use of iterative reconstruction technique. COMPARISON:  CT chest 12/13/2023 FINDINGS: Cardiovascular: No significant vascular findings. Normal heart size. No pericardial effusion. There are atherosclerotic calcifications of the aorta. Aberrant right subclavian artery again noted. Mediastinum/Nodes: Dilated esophagus with air-fluid level again seen. There surgical changes at the gastroesophageal junction. Visualized thyroid  gland within normal limits. No enlarged lymph nodes are identified. Lungs/Pleura: There are trace bilateral pleural effusions. There are atelectatic changes in the lingula and bilateral lower lobes. There is scarring in both lung apices. There are few scattered 2 mm nodular densities similar to prior. Upper Abdomen: No acute findings. Musculoskeletal: Mild T11 compression deformity is unchanged. IMPRESSION: 1. Trace bilateral pleural effusions with atelectatic changes in the lingula and bilateral lower lobes. 2. Stable dilated esophagus with air-fluid level. 3. Stable 2 mm nodular densities in the lungs. No  follow-up needed if patient is low-risk (and has no known or suspected primary neoplasm). Non-contrast chest CT can be considered in 12 months if patient is high-risk. This recommendation follows the consensus statement: Guidelines for Management of Incidental Pulmonary Nodules Detected on CT Images: From the Fleischner Society 2017; Radiology 2017; 284:228-243. 4. Aortic  atherosclerosis. Aortic Atherosclerosis (ICD10-I70.0). Electronically Signed   By: Greig Pique M.D.   On: 06/04/2024 22:53    Cardiac Studies:  ECG: SR PAC [id number] subtle lateral T wave changes    Telemetry:  SR PAC  Echo: pending   Medications:    aspirin  EC  81 mg Oral Daily   feeding supplement  237 mL Oral BID BM   fluticasone  furoate-vilanterol  1 puff Inhalation Daily   methylPREDNISolone  sodium succinate  40 mg Intravenous Q24H   pantoprazole   40 mg Oral Daily   pravastatin   20 mg Oral QPM      sodium chloride  500 mL (06/06/24 0816)   heparin  600 Units/hr (06/05/24 1800)    Assessment/Plan:  Elevated Troponin: in setting of paroxysms of dyspnea. And prior stenting of LCX most recently 2018. Troponin [phone]  She had angina at that time and has none now. Cycle enzymes and follow ECG Has subtle lateral T wave changes on ECG today with PVC. Start heparin . Check TTE. ? If transient LCX ischemia could cause ischemic MR. No significant murmur on exam. Discussed possibility of needing repeat heart cath on Monday  Rx ASA and Toprol  along with heparin  Echo pending She is on cath board and orders written        Shared Decision Making/Informed Consent The risks [stroke (1 in 1000), death (1 in 1000), kidney failure [usually temporary] (1 in 500), bleeding (1 in 200), allergic reaction [possibly serious] (1 in 200)], benefits (diagnostic support and management of coronary artery disease) and alternatives of a cardiac catheterization were discussed in detail with Kristine [name] and she is willing to proceed.  Dyspnea;  may be anginal equivalent or transient valvular regurgitation from ischemia but doubt. Certainly her achalasia with air fluid levels and moderate hiatal hernia can contribute and also increase her risk of aspiration He BNP is elevated and plan on checking TTE see above She has been written for bid iv lasix .  Kristine [name] 06/06/2024, 8:56 AM

## 2024-06-06 NOTE — Evaluation (Signed)
 Physical Therapy Evaluation Patient Details Name: Kristine [name] MRN: [id number] DOB: Oct 28, 1941 Today's Date: 06/06/2024  History of Present Illness  Pt is 83 yo presenting to [facility] via transfer from [facility] on 7/10 due to shortness of breathe. PMH: CREST syndrome, CAD, STEMI s/p DES to OM1 in 2014, HLD, THN, ischemic colitis, Barrett's esophagus, OSA on CPAP and OA p/2 AHRF  Clinical Impression  Pt is presenting below baseline level of functioning. Prior to hospitalization pt was mod I to ind for all activities. Currently pt is supervision to Min A for bed mobility, Min A for stabilization in sit to stand with SPC and unable to progress gait due to increased HR, fatigue and hot flashes. Pt has very supportive family. Due to pt current functional status, home set up and available assistance at home recommending skilled physical therapy services 3x/week in order to address strength, balance and functional mobility to decrease risk for falls, injury and re-hospitalization.           If plan is discharge home, recommend the following: Help with stairs or ramp for entrance;Assist for transportation;Assistance with cooking/housework     Equipment Recommendations Rolling walker (2 wheels)     Functional Status Assessment Patient has had a recent decline in their functional status and demonstrates the ability to make significant improvements in function in a reasonable and predictable amount of time.     Precautions / Restrictions Precautions Precautions: Fall;Other (comment) (O2 sats, BP and HR) Recall of Precautions/Restrictions: Intact Restrictions Weight Bearing Restrictions Per Provider Order: No      Mobility  Bed Mobility Overal bed mobility: Needs Assistance Bed Mobility: Supine to Sit, Sit to Supine     Supine to sit: Supervision, HOB elevated Sit to supine: Min assist   General bed mobility comments: Min A for LE back to bed. Supervision with increased time to get to  EOB.    Transfers Overall transfer level: Needs assistance Equipment used: Straight cane Transfers: Sit to/from Stand Sit to Stand: Min assist           General transfer comment: Min A for stabilization with SPC to get to standing.    Ambulation/Gait   General Gait Details: did not progress gait due to HR up to 130 bpm in standing and pt reporting that she feels hot.     Balance Overall balance assessment: Needs assistance Sitting-balance support: Single extremity supported Sitting balance-Leahy Scale: Good   Postural control: Posterior lean Standing balance support: Single extremity supported, Reliant on assistive device for balance Standing balance-Leahy Scale: Fair Standing balance comment: needs Min A for balance in standing with SPC         Pertinent Vitals/Pain Pain Assessment Pain Assessment: No/denies pain    Home Living Family/patient expects to be discharged to:: Private residence Living Arrangements: Alone (daughter wants her to move in with her) Available Help at Discharge: Family;Available PRN/intermittently Type of Home: House Home Access: Stairs to enter Entrance Stairs-Rails: None Entrance Stairs-Number of Steps: 2   Home Layout: One level Home Equipment: Cane - single point;Shower seat Additional Comments: can go stay with daughter    Prior Function Prior Level of Function : Independent/Modified Independent             Mobility Comments: Pt uses SPC to ambulate ADLs Comments: pt sits to shower, manages finances and medications.     Extremity/Trunk Assessment   Upper Extremity Assessment Upper Extremity Assessment: Generalized weakness;Defer to OT evaluation    Lower  Extremity Assessment Lower Extremity Assessment: Generalized weakness    Cervical / Trunk Assessment Cervical / Trunk Assessment: Normal  Communication   Communication Communication: Impaired Factors Affecting Communication: Hearing impaired    Cognition  Arousal: Alert Behavior During Therapy: WFL for tasks assessed/performed   PT - Cognitive impairments: No apparent impairments       Following commands: Intact       Cueing Cueing Techniques: Verbal cues     General Comments General comments (skin integrity, edema, etc.): Supine: 98/54, sitting 104/72, standing 92/69, after 3 min 113/60        Assessment/Plan    PT Assessment Patient needs continued PT services  PT Problem List Decreased strength;Decreased activity tolerance;Decreased balance;Decreased mobility;Cardiopulmonary status limiting activity       PT Treatment Interventions DME instruction;Balance training;Gait training;Stair training;Functional mobility training;Therapeutic activities;Therapeutic exercise;Patient/family education    PT Goals (Current goals can be found in the Care Plan section)  Acute Rehab PT Goals Patient Stated Goal: to improve mobility and return home. PT Goal Formulation: With patient Time For Goal Achievement: 06/20/24 Potential to Achieve Goals: Good    Frequency Min 2X/week        AM-PAC PT 6 Clicks Mobility  Outcome Measure Help needed turning from your back to your side while in a flat bed without using bedrails?: A Little Help needed moving from lying on your back to sitting on the side of a flat bed without using bedrails?: A Little Help needed moving to and from a bed to a chair (including a wheelchair)?: A Little Help needed standing up from a chair using your arms (e.g., wheelchair or bedside chair)?: A Little Help needed to walk in hospital room?: A Lot Help needed climbing 3-5 steps with a railing? : A Lot 6 Click Score: 16    End of Session Equipment Utilized During Treatment: Gait belt;Oxygen Activity Tolerance: Patient tolerated treatment well;Treatment limited secondary to medical complications (Comment) (limited due to HR) Patient left: in bed;with call bell/phone within reach Nurse Communication: Mobility  status PT Visit Diagnosis: Unsteadiness on feet (R26.81);Other abnormalities of gait and mobility (R26.89);Muscle weakness (generalized) (M62.81)    Time: 1222-1258 PT Time Calculation (min) (ACUTE ONLY): 36 min   Charges:   PT Evaluation $PT Eval Low Complexity: 1 Low PT Treatments $Therapeutic Activity: 8-22 mins PT General Charges $$ ACUTE PT VISIT: 1 Visit       Dorothyann Maier, DPT, CLT  Acute Rehabilitation Services Office: [phone] (Secure chat preferred)   Dorothyann VEAR Maier 06/06/2024, 1:06 PM

## 2024-06-07 ENCOUNTER — Inpatient Hospital Stay (HOSPITAL_COMMUNITY)

## 2024-06-07 ENCOUNTER — Encounter (HOSPITAL_COMMUNITY)
Admission: EM | Disposition: A | Discharge status: Skilled Nursing Facility | Payer: Self-pay | Source: Home / Self Care | Attending: Internal Medicine

## 2024-06-07 DIAGNOSIS — I214 Non-ST elevation (NSTEMI) myocardial infarction: Secondary | ICD-10-CM | POA: Diagnosis not present

## 2024-06-07 DIAGNOSIS — I2510 Atherosclerotic heart disease of native coronary artery without angina pectoris: Secondary | ICD-10-CM

## 2024-06-07 DIAGNOSIS — J9601 Acute respiratory failure with hypoxia: Secondary | ICD-10-CM | POA: Diagnosis not present

## 2024-06-07 DIAGNOSIS — I213 ST elevation (STEMI) myocardial infarction of unspecified site: Secondary | ICD-10-CM | POA: Diagnosis not present

## 2024-06-07 DIAGNOSIS — I493 Ventricular premature depolarization: Secondary | ICD-10-CM

## 2024-06-07 HISTORY — PX: LEFT HEART CATH AND CORONARY ANGIOGRAPHY: CATH118249

## 2024-06-07 LAB — CBC
HCT: 31.3 % — ABNORMAL LOW (ref 36.0–46.0)
Hemoglobin: 9.2 g/dL — ABNORMAL LOW (ref 12.0–15.0)
MCH: 22.2 pg — ABNORMAL LOW (ref 26.0–34.0)
MCHC: 29.4 g/dL — ABNORMAL LOW (ref 30.0–36.0)
MCV: 75.6 fL — ABNORMAL LOW (ref 80.0–100.0)
Platelets: 228 K/uL (ref 150–400)
RBC: 4.14 MIL/uL (ref 3.87–5.11)
RDW: 19.3 % — ABNORMAL HIGH (ref 11.5–15.5)
WBC: 17.2 K/uL — ABNORMAL HIGH (ref 4.0–10.5)
nRBC: 0 % (ref 0.0–0.2)

## 2024-06-07 LAB — ECHOCARDIOGRAM COMPLETE
Area-P 1/2: 2.87 cm2
Height: 60 in
S' Lateral: 3 cm
Weight: 1675.5 [oz_av]

## 2024-06-07 LAB — BASIC METABOLIC PANEL WITH GFR
Anion gap: 9 (ref 5–15)
BUN: 26 mg/dL — ABNORMAL HIGH (ref 8–23)
CO2: 32 mmol/L (ref 22–32)
Calcium: 9.3 mg/dL (ref 8.9–10.3)
Chloride: 99 mmol/L (ref 98–111)
Creatinine, Ser: 0.62 mg/dL (ref 0.44–1.00)
GFR, Estimated: >60 mL/min (ref >60)
Glucose, Bld: 140 mg/dL — ABNORMAL HIGH (ref 70–99)
Potassium: 3.2 mmol/L — ABNORMAL LOW (ref 3.5–5.1)
Sodium: 140 mmol/L (ref 135–145)

## 2024-06-07 LAB — LIPID PANEL
Cholesterol: 161 mg/dL (ref 0–200)
HDL: 47 mg/dL (ref >40)
LDL Cholesterol: 87 mg/dL (ref 0–99)
Total CHOL/HDL Ratio: 3.4 ratio
Triglycerides: 136 mg/dL (ref <150)
VLDL: 27 mg/dL (ref 0–40)

## 2024-06-07 LAB — HEPARIN LEVEL (UNFRACTIONATED): Heparin Unfractionated: 0.26 [IU]/mL — ABNORMAL LOW (ref 0.30–0.70)

## 2024-06-07 SURGERY — LEFT HEART CATH AND CORONARY ANGIOGRAPHY
Anesthesia: LOCAL

## 2024-06-07 MED ORDER — SODIUM CHLORIDE 0.9 % IV SOLN: 250.0000 mL | INTRAVENOUS | Status: AC | PRN

## 2024-06-07 MED ORDER — IOHEXOL 350 MG/ML SOLN
INTRAVENOUS | Status: DC | PRN
Start: 2024-06-07 — End: 2024-06-07
  Administered 2024-06-07: 65 mL via INTRA_ARTERIAL

## 2024-06-07 MED ORDER — LIDOCAINE HCL (PF) 1 % IJ SOLN
INTRAMUSCULAR | Status: AC
Start: 2024-06-07 — End: 2024-06-07
  Filled 2024-06-07: qty 30

## 2024-06-07 MED ORDER — SODIUM CHLORIDE 0.9 % IV SOLN
INTRAVENOUS | Status: AC
  Administered 2024-06-07: 50 mL via INTRAVENOUS

## 2024-06-07 MED ORDER — MIDAZOLAM HCL 2 MG/2ML IJ SOLN
INTRAMUSCULAR | Status: AC
  Filled 2024-06-07: qty 2

## 2024-06-07 MED ORDER — HEPARIN SODIUM (PORCINE) 1000 UNIT/ML IJ SOLN
INTRAMUSCULAR | Status: DC | PRN
  Administered 2024-06-07: 2500 [IU] via INTRAVENOUS

## 2024-06-07 MED ORDER — ASPIRIN 81 MG PO CHEW: 81.0000 mg | CHEWABLE_TABLET | ORAL | Status: DC

## 2024-06-07 MED ORDER — SODIUM CHLORIDE 0.9 % WEIGHT BASED INFUSION
3.0000 mL/kg/h | INTRAVENOUS | Status: AC
  Administered 2024-06-07: 3 mL/kg/h via INTRAVENOUS

## 2024-06-07 MED ORDER — FENTANYL CITRATE (PF) 100 MCG/2ML IJ SOLN
INTRAMUSCULAR | Status: DC | PRN
  Administered 2024-06-07: 12.5 ug via INTRAVENOUS

## 2024-06-07 MED ORDER — PREDNISONE 20 MG PO TABS
50.0000 mg | ORAL_TABLET | Freq: Every day | ORAL | Status: DC
  Administered 2024-06-07: 50 mg via ORAL
  Filled 2024-06-07: qty 3

## 2024-06-07 MED ORDER — SODIUM CHLORIDE 0.9% FLUSH
3.0000 mL | Freq: Two times a day (BID) | INTRAVENOUS | Status: DC
  Administered 2024-06-08 – 2024-06-11 (×6): 3 mL via INTRAVENOUS

## 2024-06-07 MED ORDER — ALUM & MAG HYDROXIDE-SIMETH 200-200-20 MG/5ML PO SUSP
15.0000 mL | Freq: Four times a day (QID) | ORAL | Status: DC | PRN
  Administered 2024-06-07 – 2024-06-08 (×4): 15 mL via ORAL
  Filled 2024-06-07 (×4): qty 30

## 2024-06-07 MED ORDER — LIDOCAINE HCL (PF) 1 % IJ SOLN
INTRAMUSCULAR | Status: DC | PRN
  Administered 2024-06-07: 2 mL via INTRADERMAL

## 2024-06-07 MED ORDER — LABETALOL HCL 5 MG/ML IV SOLN
10.0000 mg | INTRAVENOUS | Status: AC | PRN
Start: 2024-06-07 — End: 2024-06-07

## 2024-06-07 MED ORDER — HYDRALAZINE HCL 20 MG/ML IJ SOLN
10.0000 mg | INTRAMUSCULAR | Status: AC | PRN
Start: 2024-06-07 — End: 2024-06-07

## 2024-06-07 MED ORDER — HEPARIN SODIUM (PORCINE) 1000 UNIT/ML IJ SOLN
INTRAMUSCULAR | Status: AC
  Filled 2024-06-07: qty 10

## 2024-06-07 MED ORDER — PREDNISONE 20 MG PO TABS
40.0000 mg | ORAL_TABLET | Freq: Every day | ORAL | Status: DC
  Administered 2024-06-08 – 2024-06-09 (×2): 40 mg via ORAL
  Filled 2024-06-07 (×2): qty 2

## 2024-06-07 MED ORDER — ACETAMINOPHEN 325 MG PO TABS: 650.0000 mg | ORAL_TABLET | ORAL | Status: DC | PRN

## 2024-06-07 MED ORDER — SODIUM CHLORIDE 0.9 % WEIGHT BASED INFUSION
1.0000 mL/kg/h | INTRAVENOUS | Status: DC
  Administered 2024-06-07: 1 mL/kg/h via INTRAVENOUS

## 2024-06-07 MED ORDER — VERAPAMIL HCL 2.5 MG/ML IV SOLN
INTRAVENOUS | Status: AC
Start: 2024-06-07 — End: 2024-06-07
  Filled 2024-06-07: qty 2

## 2024-06-07 MED ORDER — FENTANYL CITRATE (PF) 100 MCG/2ML IJ SOLN
INTRAMUSCULAR | Status: AC
  Filled 2024-06-07: qty 2

## 2024-06-07 MED ORDER — ENOXAPARIN SODIUM 40 MG/0.4ML IJ SOSY
40.0000 mg | PREFILLED_SYRINGE | INTRAMUSCULAR | Status: DC
  Administered 2024-06-08 – 2024-06-09 (×2): 40 mg via SUBCUTANEOUS
  Filled 2024-06-07 (×2): qty 0.4

## 2024-06-07 MED ORDER — ASPIRIN 81 MG PO CHEW
81.0000 mg | CHEWABLE_TABLET | ORAL | Status: AC
  Administered 2024-06-07: 81 mg via ORAL
  Filled 2024-06-07: qty 1

## 2024-06-07 MED ORDER — SODIUM CHLORIDE 0.9% FLUSH
3.0000 mL | INTRAVENOUS | Status: DC | PRN
Start: 2024-06-07 — End: 2024-06-11

## 2024-06-07 MED ORDER — MIDAZOLAM HCL 2 MG/2ML IJ SOLN
INTRAMUSCULAR | Status: DC | PRN
Start: 2024-06-07 — End: 2024-06-07
  Administered 2024-06-07: .5 mg via INTRAVENOUS

## 2024-06-07 MED ORDER — SODIUM CHLORIDE 0.9 % WEIGHT BASED INFUSION: 1.0000 mL/kg/h | INTRAVENOUS | Status: DC

## 2024-06-07 MED ORDER — SODIUM CHLORIDE 0.9 % WEIGHT BASED INFUSION: 3.0000 mL/kg/h | INTRAVENOUS | Status: DC

## 2024-06-07 MED ORDER — HEPARIN (PORCINE) IN NACL 2-0.9 UNITS/ML
INTRAMUSCULAR | Status: DC | PRN
  Administered 2024-06-07 (×2): 10 mL via INTRA_ARTERIAL

## 2024-06-07 MED ORDER — POTASSIUM CHLORIDE CRYS ER 20 MEQ PO TBCR
40.0000 meq | EXTENDED_RELEASE_TABLET | Freq: Once | ORAL | Status: AC
  Administered 2024-06-07: 40 meq via ORAL
  Filled 2024-06-07: qty 2

## 2024-06-07 MED ORDER — HEPARIN (PORCINE) IN NACL 1000-0.9 UT/500ML-% IV SOLN
INTRAVENOUS | Status: DC | PRN
  Administered 2024-06-07 (×2): 500 mL

## 2024-06-07 SURGICAL SUPPLY — 9 items
CATH INFINITI 5FR MULTPACK ANG (CATHETERS) IMPLANT
CATH LAUNCHER 5F EBU3.0 (CATHETERS) IMPLANT
DEVICE RAD TR BAND REGULAR (VASCULAR PRODUCTS) IMPLANT
GLIDESHEATH SLEND SS 6F .021 (SHEATH) IMPLANT
GUIDEWIRE INQWIRE 1.5J.035X260 (WIRE) IMPLANT
GUIDEWIRE TIGER .035X300 (WIRE) IMPLANT
PACK CARDIAC CATHETERIZATION (CUSTOM PROCEDURE TRAY) ×2 IMPLANT
SET ATX-X65L (MISCELLANEOUS) IMPLANT
SHEATH GLIDE SLENDER 4/5FR (SHEATH) IMPLANT

## 2024-06-07 NOTE — Progress Notes (Signed)
 PROGRESS NOTE    [name]  FMW:[id number] DOB: 1941/04/29 DOA: 06/03/2024 PCP: Ileen Rosaline NOVAK, NP     Brief Narrative:   Kristine [name] is a 83 y.o. female with medical history significant of CREST syndrome, CAD, STEMI s/p DES to OM1 in 2014, HLD, HTN, ischemic colitis, Barrett's esophagus, OSA on CPAP, and OA presented with shortness of breath.  Had episode of syncope 7/13.  She reports that she was at the bedside commode, urinated, then developed flushing.  She became diaphoretic, lightheaded, had bradycardia down to heart rate of 29 before losing consciousness.  She recalls being back in bed and is able to relate the events that happened this morning.  Upon returning back to bed, her blood pressure and heart rate returned back to normal.  Blood sugar at this time was normal 141.  She had gotten Lasix  and metoprolol  7/12.  New events last 24 hours / Subjective: Patient feeling well this morning.  She is being taken to Cath Lab   Assessment & Plan:   Principal Problem:   NSTEMI (non-ST elevated myocardial infarction) (HCC) Active Problems:   Hypertension   CAD (coronary artery disease), native coronary artery   Hyperlipidemia   OSA on CPAP   Cr(e)st syndrome (HCC)   Acute respiratory failure with hypoxia (HCC)   COPD exacerbation (HCC)   Dyspnea   Syncope, vasovagal   Acute on chronic diastolic CHF (congestive heart failure) (HCC)    NSTEMI History of CAD status post stent placement  - Troponin 718, 629, 546 - Cardiology following - Echocardiogram showed EF 50-50 left-ventricle demonstrates regional wall motion abnl - IV heparin , aspirin   - Heart cath 7/14  Vasovagal syncope - Currently holding metoprolol  and Lasix , given 500cc IVF 7/13 - Orthostatic vital sign 7/13 PM was negative   Acute hypoxemic respiratory failure - IV Solu-Medrol  --> prednisone , Breo, duoneb   Leukocytosis - In setting of steroid use.  Continue to monitor  Acute on chronic diastolic  heart failure - Currently holding metoprolol  and Lasix  given her syncopal episode  Achalasia - CT showed stable dilated esophagus with air-fluid level - GI consulted 7/12: they are not planning any further workup in the hospital.  She does have an outpatient GI doctor follow-up in August  CREST syndrome -Outpatient rheumatology appointment in August  OSA - CPAP nightly  Hyperlipidemia - Pravastatin   Stable 2 mm lung nodules - Outpatient follow-up  Hypokalemia - Replace     DVT prophylaxis: IV heparin  Code Status: Full code Family Communication: No family at bedside Disposition Plan: Home Status is: Inpatient Remains inpatient appropriate because: IV heparin , heart cath pending today    Antimicrobials:  Anti-infectives (From admission, onward)    Start     Dose/Rate Route Frequency Ordered Stop   06/05/24 0800  cefTRIAXone  (ROCEPHIN ) 2 g in sodium chloride  0.9 % 100 mL IVPB  Status:  Discontinued        2 g 200 mL/hr over 30 Minutes Intravenous Every 24 hours 06/04/24 0756 06/04/24 1430        Objective: Vitals:   06/07/24 0617 06/07/24 0743 06/07/24 0844 06/07/24 1152  BP: 120/63 109/73  (!) 108/59  Pulse: (!) 101 85    Resp:  18  18  Temp:  98.8 F (37.1 C)  98.5 F (36.9 C)  TempSrc:  Oral  Oral  SpO2: 90% 92% 97%   Weight:      Height:        Intake/Output Summary (Last 24 hours)  at 06/07/2024 1304 Last data filed at 06/07/2024 0900 Gross per 24 hour  Intake 824.48 ml  Output 0 ml  Net 824.48 ml   Filed Weights   06/04/24 1609 06/07/24 0500  Weight: 48.8 kg 47.5 kg    Examination:  General exam: Appears calm and comfortable  Respiratory system: Clear to auscultation. Respiratory effort normal. No respiratory distress. No conversational dyspnea.  Cardiovascular system: S1 & S2 heard, RRR.  Gastrointestinal system: Abdomen is nondistended, soft Central nervous system: Alert and oriented. No focal neurological deficits. Speech clear.   Extremities: Symmetric in appearance  Skin: No rashes, lesions or ulcers on exposed skin  Psychiatry: Judgement and insight appear normal. Mood & affect appropriate.   Data Reviewed: I have personally reviewed following labs and imaging studies  CBC: Recent Labs  Lab 06/03/24 2351 06/04/24 0026 06/04/24 0140 06/04/24 0238 06/04/24 0755 06/06/24 0209 06/07/24 0513  WBC 9.6  --   --   --  11.9* 14.7* 17.2*  HGB 10.6*   < > 12.9 11.2* 10.6* 10.2* 9.2*  HCT 35.8*   < > 38.0 33.0* 36.2 34.3* 31.3*  MCV 75.1*  --   --   --  75.9* 74.9* 75.6*  PLT 199  --   --   --  217 240 228   < > = values in this interval not displayed.   Basic Metabolic Panel: Recent Labs  Lab 06/03/24 2351 06/04/24 0026 06/04/24 0238 06/04/24 0755 06/05/24 0430 06/06/24 2143 06/07/24 0513  NA 141   < > 141 141 137 139 140  K 3.7   < > 3.3* 3.6 4.1 4.0 3.2*  CL 103  --   --  104 104 99 99  CO2 23  --   --  25 25 31  32  GLUCOSE 175*  --   --  158* 120* 133* 140*  BUN 10  --   --  8 15 28* 26*  CREATININE 0.72  --   --  0.68 0.71 0.66 0.62  CALCIUM  9.8  --   --  9.8 9.2 9.4 9.3  MG  --   --   --   --  2.1 2.2  --    < > = values in this interval not displayed.   GFR: Estimated Creatinine Clearance: 39.6 mL/min (by C-G formula based on SCr of 0.62 mg/dL). Liver Function Tests: Recent Labs  Lab 06/04/24 0007  AST 20  ALT 12  ALKPHOS 87  BILITOT <0.2  PROT 6.7  ALBUMIN 4.3   Recent Labs  Lab 06/04/24 0007  LIPASE 54*   No results for input(s): AMMONIA in the last 168 hours. Coagulation Profile: No results for input(s): INR, PROTIME in the last 168 hours. Cardiac Enzymes: No results for input(s): CKTOTAL, CKMB, CKMBINDEX, TROPONINI in the last 168 hours. BNP (last 3 results) No results for input(s): PROBNP in the last 8760 hours. HbA1C: No results for input(s): HGBA1C in the last 72 hours. CBG: Recent Labs  Lab 06/04/24 0130 06/06/24 0624  GLUCAP 165* 141*    Lipid Profile: Recent Labs    06/07/24 0513  CHOL 161  HDL 47  LDLCALC 87  TRIG 136  CHOLHDL 3.4   Thyroid  Function Tests: No results for input(s): TSH, T4TOTAL, FREET4, T3FREE, THYROIDAB in the last 72 hours. Anemia Panel: No results for input(s): VITAMINB12, FOLATE, FERRITIN, TIBC, IRON , RETICCTPCT in the last 72 hours. Sepsis Labs: No results for input(s): PROCALCITON, LATICACIDVEN in the last 168 hours.  Recent  Results (from the past 240 hours)  Respiratory (~20 pathogens) panel by PCR     Status: None   Collection Time: 06/04/24  7:57 AM   Specimen: Nasopharyngeal Swab; Respiratory  Result Value Ref Range Status   Adenovirus NOT DETECTED NOT DETECTED Final   Coronavirus 229E NOT DETECTED NOT DETECTED Final    Comment: (NOTE) The Coronavirus on the Respiratory Panel, DOES NOT test for the novel  Coronavirus (2019 nCoV)    Coronavirus HKU1 NOT DETECTED NOT DETECTED Final   Coronavirus NL63 NOT DETECTED NOT DETECTED Final   Coronavirus OC43 NOT DETECTED NOT DETECTED Final   Metapneumovirus NOT DETECTED NOT DETECTED Final   Rhinovirus / Enterovirus NOT DETECTED NOT DETECTED Final   Influenza A NOT DETECTED NOT DETECTED Final   Influenza B NOT DETECTED NOT DETECTED Final   Parainfluenza Virus 1 NOT DETECTED NOT DETECTED Final   Parainfluenza Virus 2 NOT DETECTED NOT DETECTED Final   Parainfluenza Virus 3 NOT DETECTED NOT DETECTED Final   Parainfluenza Virus 4 NOT DETECTED NOT DETECTED Final   Respiratory Syncytial Virus NOT DETECTED NOT DETECTED Final   Bordetella pertussis NOT DETECTED NOT DETECTED Final   Bordetella Parapertussis NOT DETECTED NOT DETECTED Final   Chlamydophila pneumoniae NOT DETECTED NOT DETECTED Final   Mycoplasma pneumoniae NOT DETECTED NOT DETECTED Final    Comment: Performed at [facility], [address], [place], [place] [address]      Radiology Studies: DG Chest Port 1 View Result Date: 06/06/2024 EXAM: 1  VIEW XRAY OF THE CHEST 06/06/2024 07:40:35 AM COMPARISON: 1 view chest x-ray and CT chest without contrast 06/04/2024. CLINICAL HISTORY: Syncope. FINDINGS: LUNGS AND PLEURA: Changes of COPD are present. No focal pulmonary opacity. No pulmonary edema. No pleural effusion. No pneumothorax. HEART AND MEDIASTINUM: The heart is enlarged. Atherosclerotic changes are present at the aortic arch. Dilated esophagus is again noted. BONES AND SOFT TISSUES: No acute osseous abnormality. IMPRESSION: 1. No acute findings. 2. Cardiomegaly without failure. 3. Changes of COPD. 4. Atherosclerotic changes at the aortic arch. 5. Dilated esophagus. Electronically signed by: Lonni Necessary MD 06/06/2024 07:45 AM EDT RP Workstation: [id number]      Scheduled Meds:  aspirin  EC  81 mg Oral Daily   feeding supplement  237 mL Oral BID BM   fluticasone  furoate-vilanterol  1 puff Inhalation Daily   pantoprazole   40 mg Oral Daily   pravastatin   20 mg Oral QPM   [START ON 06/08/2024] predniSONE   40 mg Oral Q breakfast   Continuous Infusions:  sodium chloride  1 mL/kg/hr (06/07/24 9357)   heparin  750 Units/hr (06/07/24 0805)     LOS: 3 days   Time spent: 25 minutes   Delon Hoe, DO [facility] 06/07/2024, 1:04 PM   Available via Epic secure chat 7am-7pm After these hours, please refer to coverage provider listed on [email or web address]

## 2024-06-07 NOTE — Progress Notes (Addendum)
 Rounding Note   Patient Name: Leonora Gores Date of Encounter: 06/07/2024  [facility] Cardiologist: [name], MD   Subjective Feeling a little anxious for cath.  No CP/SOB.    Scheduled Meds:  aspirin  EC  81 mg Oral Daily   feeding supplement  237 mL Oral BID BM   fluticasone  furoate-vilanterol  1 puff Inhalation Daily   pantoprazole   40 mg Oral Daily   potassium chloride   40 mEq Oral Once   pravastatin   20 mg Oral QPM   predniSONE   50 mg Oral Q breakfast   Continuous Infusions:  sodium chloride  1 mL/kg/hr (06/07/24 0642)   heparin  750 Units/hr (06/07/24 0805)   PRN Meds: alum & mag hydroxide-simeth, ipratropium-albuterol , ondansetron  (ZOFRAN ) IV, traMADol    Vital Signs  Vitals:   06/07/24 0416 06/07/24 0500 06/07/24 0617 06/07/24 0743  BP: 122/69  120/63   Pulse: 100  (!) 101 85  Resp: 19   18  Temp: 99.1 F (37.3 C)   98.8 F (37.1 C)  TempSrc: Oral   Oral  SpO2: 93%  90% 92%  Weight:  47.5 kg    Height:        Intake/Output Summary (Last 24 hours) at 06/07/2024 0814 Last data filed at 06/07/2024 9357 Gross per 24 hour  Intake 944.48 ml  Output --  Net 944.48 ml      06/07/2024    5:00 AM 06/04/2024    4:09 PM 12/12/2023    7:47 PM  Last 3 Weights  Weight (lbs) 104 lb 11.5 oz 107 lb 9.6 oz 110 lb  Weight (kg) 47.5 kg 48.807 kg 49.896 kg      Telemetry Sinus rhythm. PVCs.  Up to 4 beats NSVT.  V bigeminy,.  Short episode of atrial fibrillation with RVR  - Personally Reviewed  ECG   - Personally Reviewed  Physical Exam  VS:  BP 120/63   Pulse 85   Temp 98.8 F (37.1 C) (Oral)   Resp 18   Ht 5' (1.524 m)   Wt 47.5 kg   LMP 11/25/1994   SpO2 92%   BMI 20.45 kg/m  , BMI Body mass index is 20.45 kg/m. GENERAL:  Well appearing HEENT: Pupils equal round and reactive, fundi not visualized, oral mucosa unremarkable NECK:  No jugular venous distention, waveform within normal limits, carotid upstroke brisk and symmetric, no bruits, no  thyromegaly LUNGS:  Clear to auscultation bilaterally HEART:  Mostly regular with occasional ectopy.  PMI not displaced or sustained,S1 and S2 within normal limits, no S3, no S4, no clicks, no rubs, II/VI systolic murmurs ABD:  Flat, positive bowel sounds normal in frequency in pitch, no bruits, no rebound, no guarding, no midline pulsatile mass, no hepatomegaly, no splenomegaly EXT:  2 plus pulses throughout, no edema, no cyanosis no clubbing SKIN:  No rashes no nodules NEURO:  Cranial nerves II through XII grossly intact, motor grossly intact throughout PSYCH:  Cognitively intact, oriented to person place and time  Labs High Sensitivity Troponin:   Recent Labs  Lab 06/05/24 1444 06/05/24 1640 06/06/24 0502  TROPONINIHS 718* 629* 546*     Chemistry Recent Labs  Lab 06/04/24 0007 06/04/24 0026 06/05/24 0430 06/06/24 2143 06/07/24 0513  NA  --    < > 137 139 140  K  --    < > 4.1 4.0 3.2*  CL  --    < > 104 99 99  CO2  --    < > 25  31 32  GLUCOSE  --    < > 120* 133* 140*  BUN  --    < > 15 28* 26*  CREATININE  --    < > 0.71 0.66 0.62  CALCIUM   --    < > 9.2 9.4 9.3  MG  --   --  2.1 2.2  --   PROT 6.7  --   --   --   --   ALBUMIN 4.3  --   --   --   --   AST 20  --   --   --   --   ALT 12  --   --   --   --   ALKPHOS 87  --   --   --   --   BILITOT <0.2  --   --   --   --   GFRNONAA  --    < > >60 >60 >60  ANIONGAP  --    < > 8 9 9    < > = values in this interval not displayed.    Lipids No results for input(s): CHOL, TRIG, HDL, LABVLDL, LDLCALC, CHOLHDL in the last 168 hours.  Hematology Recent Labs  Lab 06/04/24 0755 06/06/24 0209 06/07/24 0513  WBC 11.9* 14.7* 17.2*  RBC 4.77 4.58 4.14  HGB 10.6* 10.2* 9.2*  HCT 36.2 34.3* 31.3*  MCV 75.9* 74.9* 75.6*  MCH 22.2* 22.3* 22.2*  MCHC 29.3* 29.7* 29.4*  RDW 19.3* 19.0* 19.3*  PLT 217 240 228   Thyroid  No results for input(s): TSH, FREET4 in the last 168 hours.  BNP Recent Labs  Lab  06/04/24 1831  BNP 1,207.9*    DDimer No results for input(s): DDIMER in the last 168 hours.   Radiology  DG Chest Port 1 View Result Date: 06/06/2024 EXAM: 1 VIEW XRAY OF THE CHEST 06/06/2024 07:40:35 AM COMPARISON: 1 view chest x-ray and CT chest without contrast 06/04/2024. CLINICAL HISTORY: Syncope. FINDINGS: LUNGS AND PLEURA: Changes of COPD are present. No focal pulmonary opacity. No pulmonary edema. No pleural effusion. No pneumothorax. HEART AND MEDIASTINUM: The heart is enlarged. Atherosclerotic changes are present at the aortic arch. Dilated esophagus is again noted. BONES AND SOFT TISSUES: No acute osseous abnormality. IMPRESSION: 1. No acute findings. 2. Cardiomegaly without failure. 3. Changes of COPD. 4. Atherosclerotic changes at the aortic arch. 5. Dilated esophagus. Electronically signed by: Lonni Necessary MD 06/06/2024 07:45 AM EDT RP Workstation: [id number]    Cardiac Studies Echo 10/2023: 1. Left ventricular ejection fraction, by estimation, is 50 to 55%. The  left ventricle has low normal function. The left ventricle has no regional  wall motion abnormalities. There is mild asymmetric left ventricular  hypertrophy of the basal-septal  segment. Left ventricular diastolic parameters were normal.   2. Right ventricular systolic function is normal. The right ventricular  size is normal.   3. The mitral valve is normal in structure. Trivial mitral valve  regurgitation. No evidence of mitral stenosis.   4. The aortic valve is tricuspid. There is mild calcification of the  aortic valve. Aortic valve regurgitation is trivial. No aortic stenosis is  present.   Patient Profile   83 y.o. female with CAD, STEMI s/p LCX PCI 2018, hypertension, hyperlipidemia, ischemic colitis, OSA on CPAP admitted with NSTEMI.   Assessment & Plan  # NSTEMI:  # Hyperlipidemia: Hs troponin elevated to 718.  Planning for [facility] today. She request L wrist due to recent fracture of  the R wrist.   Currently chest pain free. Check fasting lipids.  Continue aspirin  and pravastatin .   Informed Consent   Shared Decision Making/Informed Consent The risks [stroke (1 in 1000), death (1 in 1000), kidney failure [usually temporary] (1 in 500), bleeding (1 in 200), allergic reaction [possibly serious] (1 in 200)], benefits (diagnostic support and management of coronary artery disease) and alternatives of a cardiac catheterization were discussed in detail with Ms. Pieper and she is willing to proceed.    # Murmur:  Echo pending. No heart failure on exam  # Syncope: Episode of syncope AM 7/13.  Her metoprolol  has been held. She received IV fluids. She has not been eating or drinking well.  Oral mucosa dry, Encouraged hydration.    # PVCs:  Frequent PVCs and short runs of tachcyardia on telemetry.  She needs metoprolol  but currently held for syncope.  Recommend IV fluids and encouraged hydration.  Cath to evaluate for ischemia as above.  Resume metoprolol  when able.      For questions or updates, please contact [facility] Please consult [email or web address] for contact info under     Signed, Annabella Scarce, MD  06/07/2024, 8:14 AM

## 2024-06-07 NOTE — Progress Notes (Signed)
 [facility] night cross cover note:   Prn Maalox/Mylanta added for this patient's complaint of heartburn.       Eva Pore, DO Hospitalist

## 2024-06-07 NOTE — Interval H&P Note (Signed)
 History and Physical Interval Note:  06/07/2024 2:36 PM  Kristine [name]  has presented today for surgery, with the diagnosis of NSTEMI.  The various methods of treatment have been discussed with the patient and family. After consideration of risks, benefits and other options for treatment, the patient has consented to  Procedure(s): LEFT HEART CATH AND CORONARY ANGIOGRAPHY (N/A) as a surgical intervention.  The patient's history has been reviewed, patient examined, no change in status, stable for surgery.  I have reviewed the patient's chart and labs.  Questions were answered to the patient's satisfaction.    Cath Lab Visit (complete for each Cath Lab visit)  Clinical Evaluation Leading to the Procedure:   ACS: Yes.    Non-ACS: N/A  Bereket Gernert

## 2024-06-07 NOTE — Progress Notes (Signed)
  Echocardiogram 2D Echocardiogram has been performed.  Kristine [name], RDCS 06/07/2024, 10:20 AM

## 2024-06-07 NOTE — Plan of Care (Signed)

## 2024-06-07 NOTE — Plan of Care (Signed)
  Problem: Education: Goal: Knowledge of General Education information will improve Description: Including pain rating scale, medication(s)/side effects and non-pharmacologic comfort measures Outcome: Progressing   Problem: Activity: Goal: Risk for activity intolerance will decrease Outcome: Progressing   Problem: Coping: Goal: Level of anxiety will decrease Outcome: Progressing   Problem: Elimination: Goal: Will not experience complications related to bowel motility Outcome: Progressing Goal: Will not experience complications related to urinary retention Outcome: Progressing   Problem: Pain Managment: Goal: General experience of comfort will improve and/or be controlled Outcome: Progressing

## 2024-06-07 NOTE — H&P (View-Only) (Signed)
 Rounding Note   Patient Name: Kristine [name] Date of Encounter: 06/07/2024  [facility] Cardiologist: [name], MD   Subjective Feeling a little anxious for cath.  No CP/SOB.    Scheduled Meds:  aspirin  EC  81 mg Oral Daily   feeding supplement  237 mL Oral BID BM   fluticasone  furoate-vilanterol  1 puff Inhalation Daily   pantoprazole   40 mg Oral Daily   potassium chloride   40 mEq Oral Once   pravastatin   20 mg Oral QPM   predniSONE   50 mg Oral Q breakfast   Continuous Infusions:  sodium chloride  1 mL/kg/hr (06/07/24 0642)   heparin  750 Units/hr (06/07/24 0805)   PRN Meds: alum & mag hydroxide-simeth, ipratropium-albuterol , ondansetron  (ZOFRAN ) IV, traMADol    Vital Signs  Vitals:   06/07/24 0416 06/07/24 0500 06/07/24 0617 06/07/24 0743  BP: 122/69  120/63   Pulse: 100  (!) 101 85  Resp: 19   18  Temp: 99.1 F (37.3 C)   98.8 F (37.1 C)  TempSrc: Oral   Oral  SpO2: 93%  90% 92%  Weight:  47.5 kg    Height:        Intake/Output Summary (Last 24 hours) at 06/07/2024 0814 Last data filed at 06/07/2024 9357 Gross per 24 hour  Intake 944.48 ml  Output --  Net 944.48 ml      06/07/2024    5:00 AM 06/04/2024    4:09 PM 12/12/2023    7:47 PM  Last 3 Weights  Weight (lbs) 104 lb 11.5 oz 107 lb 9.6 oz 110 lb  Weight (kg) 47.5 kg 48.807 kg 49.896 kg      Telemetry Sinus rhythm. PVCs.  Up to 4 beats NSVT.  V bigeminy,.  Short episode of atrial fibrillation with RVR  - Personally Reviewed  ECG   - Personally Reviewed  Physical Exam  VS:  BP 120/63   Pulse 85   Temp 98.8 F (37.1 C) (Oral)   Resp 18   Ht 5' (1.524 m)   Wt 47.5 kg   LMP 11/25/1994   SpO2 92%   BMI 20.45 kg/m  , BMI Body mass index is 20.45 kg/m. GENERAL:  Well appearing HEENT: Pupils equal round and reactive, fundi not visualized, oral mucosa unremarkable NECK:  No jugular venous distention, waveform within normal limits, carotid upstroke brisk and symmetric, no bruits, no  thyromegaly LUNGS:  Clear to auscultation bilaterally HEART:  Mostly regular with occasional ectopy.  PMI not displaced or sustained,S1 and S2 within normal limits, no S3, no S4, no clicks, no rubs, II/VI systolic murmurs ABD:  Flat, positive bowel sounds normal in frequency in pitch, no bruits, no rebound, no guarding, no midline pulsatile mass, no hepatomegaly, no splenomegaly EXT:  2 plus pulses throughout, no edema, no cyanosis no clubbing SKIN:  No rashes no nodules NEURO:  Cranial nerves II through XII grossly intact, motor grossly intact throughout PSYCH:  Cognitively intact, oriented to person place and time  Labs High Sensitivity Troponin:   Recent Labs  Lab 06/05/24 1444 06/05/24 1640 06/06/24 0502  TROPONINIHS 718* 629* 546*     Chemistry Recent Labs  Lab 06/04/24 0007 06/04/24 0026 06/05/24 0430 06/06/24 2143 06/07/24 0513  NA  --    < > 137 139 140  K  --    < > 4.1 4.0 3.2*  CL  --    < > 104 99 99  CO2  --    < > 25  31 32  GLUCOSE  --    < > 120* 133* 140*  BUN  --    < > 15 28* 26*  CREATININE  --    < > 0.71 0.66 0.62  CALCIUM   --    < > 9.2 9.4 9.3  MG  --   --  2.1 2.2  --   PROT 6.7  --   --   --   --   ALBUMIN 4.3  --   --   --   --   AST 20  --   --   --   --   ALT 12  --   --   --   --   ALKPHOS 87  --   --   --   --   BILITOT <0.2  --   --   --   --   GFRNONAA  --    < > >60 >60 >60  ANIONGAP  --    < > 8 9 9    < > = values in this interval not displayed.    Lipids No results for input(s): CHOL, TRIG, HDL, LABVLDL, LDLCALC, CHOLHDL in the last 168 hours.  Hematology Recent Labs  Lab 06/04/24 0755 06/06/24 0209 06/07/24 0513  WBC 11.9* 14.7* 17.2*  RBC 4.77 4.58 4.14  HGB 10.6* 10.2* 9.2*  HCT 36.2 34.3* 31.3*  MCV 75.9* 74.9* 75.6*  MCH 22.2* 22.3* 22.2*  MCHC 29.3* 29.7* 29.4*  RDW 19.3* 19.0* 19.3*  PLT 217 240 228   Thyroid  No results for input(s): TSH, FREET4 in the last 168 hours.  BNP Recent Labs  Lab  06/04/24 1831  BNP 1,207.9*    DDimer No results for input(s): DDIMER in the last 168 hours.   Radiology  DG Chest Port 1 View Result Date: 06/06/2024 EXAM: 1 VIEW XRAY OF THE CHEST 06/06/2024 07:40:35 AM COMPARISON: 1 view chest x-ray and CT chest without contrast 06/04/2024. CLINICAL HISTORY: Syncope. FINDINGS: LUNGS AND PLEURA: Changes of COPD are present. No focal pulmonary opacity. No pulmonary edema. No pleural effusion. No pneumothorax. HEART AND MEDIASTINUM: The heart is enlarged. Atherosclerotic changes are present at the aortic arch. Dilated esophagus is again noted. BONES AND SOFT TISSUES: No acute osseous abnormality. IMPRESSION: 1. No acute findings. 2. Cardiomegaly without failure. 3. Changes of COPD. 4. Atherosclerotic changes at the aortic arch. 5. Dilated esophagus. Electronically signed by: Lonni Necessary MD 06/06/2024 07:45 AM EDT RP Workstation: [id number]    Cardiac Studies Echo 10/2023: 1. Left ventricular ejection fraction, by estimation, is 50 to 55%. The  left ventricle has low normal function. The left ventricle has no regional  wall motion abnormalities. There is mild asymmetric left ventricular  hypertrophy of the basal-septal  segment. Left ventricular diastolic parameters were normal.   2. Right ventricular systolic function is normal. The right ventricular  size is normal.   3. The mitral valve is normal in structure. Trivial mitral valve  regurgitation. No evidence of mitral stenosis.   4. The aortic valve is tricuspid. There is mild calcification of the  aortic valve. Aortic valve regurgitation is trivial. No aortic stenosis is  present.   Patient Profile   83 y.o. female with CAD, STEMI s/p LCX PCI 2018, hypertension, hyperlipidemia, ischemic colitis, OSA on CPAP admitted with NSTEMI.   Assessment & Plan  # NSTEMI:  # Hyperlipidemia: Hs troponin elevated to 718.  Planning for [facility] today. She request L wrist due to recent fracture of  the R wrist.   Currently chest pain free. Check fasting lipids.  Continue aspirin  and pravastatin .   Informed Consent   Shared Decision Making/Informed Consent The risks [stroke (1 in 1000), death (1 in 1000), kidney failure [usually temporary] (1 in 500), bleeding (1 in 200), allergic reaction [possibly serious] (1 in 200)], benefits (diagnostic support and management of coronary artery disease) and alternatives of a cardiac catheterization were discussed in detail with Ms. Pieper and she is willing to proceed.    # Murmur:  Echo pending. No heart failure on exam  # Syncope: Episode of syncope AM 7/13.  Her metoprolol  has been held. She received IV fluids. She has not been eating or drinking well.  Oral mucosa dry, Encouraged hydration.    # PVCs:  Frequent PVCs and short runs of tachcyardia on telemetry.  She needs metoprolol  but currently held for syncope.  Recommend IV fluids and encouraged hydration.  Cath to evaluate for ischemia as above.  Resume metoprolol  when able.      For questions or updates, please contact [facility] Please consult [email or web address] for contact info under     Signed, Annabella Scarce, MD  06/07/2024, 8:14 AM

## 2024-06-07 NOTE — Progress Notes (Signed)
 PHARMACY - ANTICOAGULATION CONSULT NOTE  Pharmacy Consult for IV heparin  Indication: chest pain/ACS  Allergies  Allergen Reactions   Nsaids Other (See Comments)    Prone to bleeding   Aspirin  Other (See Comments)    Prone to bleeding OK to take low-dose EC but not aspirin  based medications.    Patient Measurements: Height: 5' (152.4 cm) Weight: 47.5 kg (104 lb 11.5 oz) IBW/kg (Calculated) : 45.5 HEPARIN  DW (KG): 48.8  Vital Signs: Temp: 99.1 F (37.3 C) (07/14 0416) Temp Source: Oral (07/14 0416) BP: 122/69 (07/14 0416) Pulse Rate: 100 (07/14 0416)  Labs: Recent Labs    06/04/24 0755 06/05/24 0430 06/05/24 1444 06/05/24 1640 06/06/24 0209 06/06/24 0502 06/06/24 1231 06/06/24 2143 06/07/24 0513  HGB 10.6*  --   --   --  10.2*  --   --   --  9.2*  HCT 36.2  --   --   --  34.3*  --   --   --  31.3*  PLT 217  --   --   --  240  --   --   --  228  HEPARINUNFRC  --   --   --   --  0.54  --  0.32  --  0.26*  CREATININE 0.68 0.71  --   --   --   --   --  0.66 0.62  TROPONINIHS  --   --  718* 629*  --  546*  --   --   --     Estimated Creatinine Clearance: 39.6 mL/min (by C-G formula based on SCr of 0.62 mg/dL).   Medical History: Past Medical History:  Diagnosis Date   Anemia    Arthritis    Asthma    Barrett's esophagus    Stage B   CAD (coronary artery disease)    a. s/p STEMI 7/14 >> LHC: LM 20, mLAD 30-40, small Dx 100 (L-L collats), OM1 90, OM2 30-40, mRCA 50-60, Lat/AL akinesis, EF 35-40% >> PCI: Promus DES to OM1;  b. Myoview 3/16: diaph atten, no ischemia, EF 59% (intermediate risk b/c BP drop 152>>137, but recovery BP 162 (tech error) >> med Rx continued  c. cath 05/30/17 s/p FFR guided PCI with DES to circumflex/first OM    COVID-19 virus infection    CREST variant of scleroderma (HCC)    Cystocele    Diverticulitis    Event monitor 04/2019   Event monitor 04/2019: Sinus rhythm; no atrial fibrillation/flutter; no bradycardic events; few PVCs and  occasional ventricular runs up to 9 beats   GERD (gastroesophageal reflux disease)    History of blood transfusion    hx of being Anti-K positive   History of Doppler ultrasound    a. Carotid US  2/12: bilateral ICA < 50%   Hyperlipidemia    Hypertension    Ischemic cardiomyopathy    a. EF 35-40% by LHC at time of MI;  b. Echo 7/14:  Mild focal basal septal hypertrophy, EF 50-55%, apical HK, grade 1 diastolic dysfunction, trivial AI    Ischemic colitis (HCC)    Orthostatic hypotension    OSA on CPAP    Osteoporosis 12/2017   T score -2.6   Raynaud disease    Rectocele    mild      Assessment: 83 YO F with PMHx CAD, STEMI s/p DES to OM1 in 2014, HLD, HTN presenting with SOB. BNP 1270.9; Troponin +. No anticoagulation prior to admission. Pharmacy consulted for heparin .  Planned cath 7/14. Heparin  level 0.26 is subtherapeutic on 650 units/hr.  No issues with infusion or bleeding per RN.   Goal of Therapy:  Heparin  level 0.3-0.7 units/ml Monitor platelets by anticoagulation protocol: Yes   Plan:  Increase heparin  to 750 units/hr F/u 8hr heparin  level Monitor daily heparin  level, CBC, signs/symptoms of bleeding  F/u post cath  Jinnie Door, PharmD, BCPS, BCCP Clinical Pharmacist  Please check AMION for all [facility] Pharmacy phone numbers After 10:00 PM, call Main Pharmacy [phone]

## 2024-06-08 ENCOUNTER — Encounter (HOSPITAL_COMMUNITY): Payer: Self-pay | Admitting: Internal Medicine

## 2024-06-08 ENCOUNTER — Inpatient Hospital Stay (HOSPITAL_COMMUNITY)

## 2024-06-08 DIAGNOSIS — R0602 Shortness of breath: Secondary | ICD-10-CM

## 2024-06-08 DIAGNOSIS — I214 Non-ST elevation (NSTEMI) myocardial infarction: Secondary | ICD-10-CM | POA: Diagnosis not present

## 2024-06-08 LAB — CBC
HCT: 30.5 % — ABNORMAL LOW (ref 36.0–46.0)
Hemoglobin: 8.8 g/dL — ABNORMAL LOW (ref 12.0–15.0)
MCH: 22.2 pg — ABNORMAL LOW (ref 26.0–34.0)
MCHC: 28.9 g/dL — ABNORMAL LOW (ref 30.0–36.0)
MCV: 76.8 fL — ABNORMAL LOW (ref 80.0–100.0)
Platelets: 228 K/uL (ref 150–400)
RBC: 3.97 MIL/uL (ref 3.87–5.11)
RDW: 19.6 % — ABNORMAL HIGH (ref 11.5–15.5)
WBC: 19.1 K/uL — ABNORMAL HIGH (ref 4.0–10.5)
nRBC: 0 % (ref 0.0–0.2)

## 2024-06-08 LAB — BASIC METABOLIC PANEL WITH GFR
Anion gap: 10 (ref 5–15)
BUN: 23 mg/dL (ref 8–23)
CO2: 28 mmol/L (ref 22–32)
Calcium: 9.2 mg/dL (ref 8.9–10.3)
Chloride: 104 mmol/L (ref 98–111)
Creatinine, Ser: 0.64 mg/dL (ref 0.44–1.00)
GFR, Estimated: >60 mL/min (ref >60)
Glucose, Bld: 156 mg/dL — ABNORMAL HIGH (ref 70–99)
Potassium: 3.3 mmol/L — ABNORMAL LOW (ref 3.5–5.1)
Sodium: 142 mmol/L (ref 135–145)

## 2024-06-08 LAB — MAGNESIUM: Magnesium: 2.1 mg/dL (ref 1.7–2.4)

## 2024-06-08 LAB — D-DIMER, QUANTITATIVE: D-Dimer, Quant: 1.72 ug{FEU}/mL — ABNORMAL HIGH (ref 0.00–0.50)

## 2024-06-08 MED ORDER — METOPROLOL SUCCINATE ER 25 MG PO TB24
12.5000 mg | ORAL_TABLET | Freq: Every day | ORAL | Status: DC
  Administered 2024-06-08 – 2024-06-09 (×2): 12.5 mg via ORAL
  Filled 2024-06-08 (×3): qty 1

## 2024-06-08 MED ORDER — SODIUM CHLORIDE 0.9 % IV BOLUS
1000.0000 mL | Freq: Once | INTRAVENOUS | Status: AC
  Administered 2024-06-08: 1000 mL via INTRAVENOUS

## 2024-06-08 MED ORDER — PRAVASTATIN SODIUM 40 MG PO TABS
40.0000 mg | ORAL_TABLET | Freq: Every evening | ORAL | Status: DC
  Administered 2024-06-08 – 2024-06-10 (×3): 40 mg via ORAL
  Filled 2024-06-08 (×3): qty 1

## 2024-06-08 MED ORDER — POTASSIUM CHLORIDE CRYS ER 20 MEQ PO TBCR
40.0000 meq | EXTENDED_RELEASE_TABLET | Freq: Once | ORAL | Status: AC
  Administered 2024-06-08: 40 meq via ORAL
  Filled 2024-06-08: qty 2

## 2024-06-08 MED ORDER — IOHEXOL 350 MG/ML SOLN
75.0000 mL | Freq: Once | INTRAVENOUS | Status: AC | PRN
Start: 2024-06-08 — End: 2024-06-08
  Administered 2024-06-08: 75 mL via INTRAVENOUS

## 2024-06-08 NOTE — NC FL2 (Signed)
 [place]  MEDICAID FL2 LEVEL OF CARE FORM     IDENTIFICATION  Patient Name: Kristine [name] Birthdate: [birth year] Sex: female Admission Date (Current Location): 06/03/2024  [facility] and [place] Number:  Producer, television/film/video and Address:  [facility], [address] [place], [place] [address]      Provider Number: [id number]  Attending Physician Name and Address:  Rojelio Nest, DO  Relative Name and Phone Number:  Powell Sprang (daughter) [phone]    Current Level of Care: Hospital Recommended Level of Care: Skilled Nursing Facility Prior Approval Number:    Date Approved/Denied:   PASRR Number: [id number]  Discharge Plan: SNF    Current Diagnoses: Patient Active Problem List   Diagnosis Date Noted   Syncope, vasovagal 06/06/2024   Acute on chronic diastolic CHF (congestive heart failure) (HCC) 06/06/2024   Dyspnea 06/05/2024   Acute respiratory failure with hypoxia (HCC) 06/04/2024   COPD exacerbation (HCC) 06/04/2024   Upper respiratory tract infection 09/27/2022   Mild intermittent reactive airway disease 07/24/2022   Chronic rhinitis 07/24/2022   Vertigo 07/24/2021   Pseudophakia of left eye 05/31/2021   Pseudophakia of right eye 05/31/2021   History of osteoporosis 05/27/2021   Mononucleosis 05/27/2021   Cystoid macular edema of left eye 10/11/2020   NSTEMI (non-ST elevated myocardial infarction) (HCC) 10/09/2020   COVID-19 virus infection 05/04/2020   Type 2 macular telangiectasis, right 03/20/2020   Cystoid macular edema of right eye 03/20/2020   Type 2 macular telangiectasis, left 03/20/2020   OSA on CPAP 03/20/2020   Cr(e)st syndrome (HCC) 05/25/2018   Ulcer of esophagus with bleeding 02/27/2018   Psychophysiological insomnia 09/24/2017   Abnormal nuclear cardiac imaging test 05/30/2017   Abnormal stress echo    Solar lentigo 11/08/2016   Acute blood loss anemia 10/10/2015   Red blood cell antibody positive 10/04/2015    S/P coronary artery stent placement 07/12/2014   CAD (coronary artery disease), native coronary artery 06/27/2013   Hyperlipidemia 06/27/2013   Chest pain of uncertain etiology 06/26/2013   History of ST elevation myocardial infarction (STEMI) 06/24/2013   Hypertension    Great toe pain 09/11/2012   H/O foot surgery 04/09/2012   Hallux varus, acquired 02/25/2012   GERD (gastroesophageal reflux disease)    Barrett's esophagus    Cystocele    Rectocele     Orientation RESPIRATION BLADDER Height & Weight     Self, Time, Situation, Place  O2 (Nasal Cannula 2 liters) Continent, External catheter (External Urinary Catheter) Weight: 104 lb 11.5 oz (47.5 kg) Height:  5' (152.4 cm)  BEHAVIORAL SYMPTOMS/MOOD NEUROLOGICAL BOWEL NUTRITION STATUS      Continent Diet (Please see discharge summary)  AMBULATORY STATUS COMMUNICATION OF NEEDS Skin   Extensive Assist Verbally Other (Comment) (WDL,Wound/Incision LDAs)                       Personal Care Assistance Level of Assistance  Bathing, Feeding, Dressing Bathing Assistance: Maximum assistance   Dressing Assistance: Maximum assistance     Functional Limitations Info  Sight, Hearing, Speech Sight Info: Adequate Hearing Info: Impaired (Hearing aids) Speech Info: Adequate    SPECIAL CARE FACTORS FREQUENCY  PT (By licensed PT), OT (By licensed OT)     PT Frequency: 5x min weekly OT Frequency: 5x min weekly            Contractures Contractures Info: Not present    Additional Factors Info  Code Status, Allergies Code Status  Info: FULL Allergies Info: Nsaids,Aspirin            Current Medications (06/08/2024):  This is the current hospital active medication list Current Facility-Administered Medications  Medication Dose Route Frequency Provider Last Rate Last Admin   0.9 %  sodium chloride  infusion  250 mL Intravenous PRN End, Lonni, MD       acetaminophen  (TYLENOL ) tablet 650 mg  650 mg Oral Q4H PRN End,  Lonni, MD       alum & mag hydroxide-simeth (MAALOX/MYLANTA) 200-200-20 MG/5ML suspension 15 mL  15 mL Oral Q6H PRN End, Christopher, MD   15 mL at 06/08/24 9178   aspirin  EC tablet 81 mg  81 mg Oral Daily End, Christopher, MD   81 mg at 06/08/24 1109   enoxaparin  (LOVENOX ) injection 40 mg  40 mg Subcutaneous Q24H End, Christopher, MD   40 mg at 06/08/24 0818   feeding supplement (ENSURE PLUS HIGH PROTEIN) liquid 237 mL  237 mL Oral BID BM End, Christopher, MD   237 mL at 06/06/24 1443   fluticasone  furoate-vilanterol (BREO ELLIPTA ) 100-25 MCG/ACT 1 puff  1 puff Inhalation Daily End, Christopher, MD   1 puff at 06/08/24 0809   ipratropium-albuterol  (DUONEB) 0.5-2.5 (3) MG/3ML nebulizer solution 3 mL  3 mL Nebulization Q6H PRN End, Christopher, MD   3 mL at 06/04/24 1321   metoprolol  succinate (TOPROL -XL) 24 hr tablet 12.5 mg  12.5 mg Oral Daily Raford Riggs, MD   12.5 mg at 06/08/24 1108   ondansetron  (ZOFRAN ) injection 4 mg  4 mg Intravenous Q6H PRN End, Christopher, MD   4 mg at 06/08/24 0231   pantoprazole  (PROTONIX ) EC tablet 40 mg  40 mg Oral Daily End, Christopher, MD   40 mg at 06/08/24 1109   pravastatin  (PRAVACHOL ) tablet 40 mg  40 mg Oral QPM Raford Riggs, MD       predniSONE  (DELTASONE ) tablet 40 mg  40 mg Oral Q breakfast End, Christopher, MD   40 mg at 06/08/24 0819   sodium chloride  flush (NS) 0.9 % injection 3 mL  3 mL Intravenous Q12H End, Christopher, MD   3 mL at 06/08/24 1110   sodium chloride  flush (NS) 0.9 % injection 3 mL  3 mL Intravenous PRN End, Lonni, MD       traMADol  (ULTRAM ) tablet 50 mg  50 mg Oral Q6H PRN End, Lonni, MD   50 mg at 06/04/24 2317     Discharge Medications: Please see discharge summary for a list of discharge medications.  Relevant Imaging Results:  Relevant Lab Results:   Additional Information [id number]  Isaiah Public, LCSWA

## 2024-06-08 NOTE — Progress Notes (Signed)
 Physical Therapy Treatment Patient Details Name: Kristine [name] MRN: [id number] DOB: 1941-02-27 Today's Date: 06/08/2024   History of Present Illness Pt is 83 yo presenting to [facility] via transfer from [facility] on 7/10 due to shortness of breath. Work up revealed NSTEMI, and acute respiratory failure with hypoxia due to COPD exacerbation. S/p LHC on 7/14. PMH: CREST syndrome, CAD, STEMI s/p DES to OM1 in 2014, HLD, HTN, ischemic colitis, Barrett's esophagus, OSA on CPAP, and OA.    PT Comments  The pt was agreeable to session, but presents with significant limitations this morning due to fatigue, nausea, and instability in vitals with mobility (see below). The pt required increased assistance for all mobility compared to initial evaluation last week, and was limited to standing with modA for ~30 seconds prior to needing to return to supine due to fatigue. Pt also with HR max of 140bpm in standing. Given poor progress for mobility and increased need for assist with all transfers, pt is not currently safe to return home, will benefit from continued skilled PT acutely and after d/c to improve independence with mobility and transfers prior to anticipated d/c home with her daughter.   VITALS:  - supine in bed - BP: 113/49 (66); HR: 84bpm - supine in bed - BP: 89/38 (50); HR: 92bpm - sitting EOB - BP: 111/67 (79); HR: 122bpm - standing - BP: 112/64 (79); HR: 135bpm - standing after 3 min - pt unable to remain standing for 3 min - supine at end of session - BP: 122/56 (77); HR: 100bpm     ` If plan is discharge home, recommend the following: Two people to help with walking and/or transfers;A lot of help with bathing/dressing/bathroom;Assistance with cooking/housework;Direct supervision/assist for medications management;Direct supervision/assist for financial management;Assist for transportation;Help with stairs or ramp for entrance;Supervision due to cognitive status   Can travel by private  vehicle     No  Equipment Recommendations  Rolling walker (2 wheels);Wheelchair (measurements PT);Wheelchair cushion (measurements PT)    Recommendations for Other Services       Precautions / Restrictions Precautions Precautions: Fall;Other (comment) Recall of Precautions/Restrictions: Intact Precaution/Restrictions Comments: watch BP and HR Restrictions Weight Bearing Restrictions Per Provider Order: No     Mobility  Bed Mobility Overal bed mobility: Needs Assistance Bed Mobility: Supine to Sit, Sit to Supine     Supine to sit: Min assist, Used rails Sit to supine: Min assist, Used rails   General bed mobility comments: minA to complete with increased time. pt was able to manage LE and reposition in bed    Transfers Overall transfer level: Needs assistance Equipment used: 1 person hand held assist Transfers: Sit to/from Stand Sit to Stand: Mod assist           General transfer comment: modA to rise, minA to stabilize in standing. pt leaning on therapist due to fatigue. HR 140    Ambulation/Gait Ambulation/Gait assistance: Mod assist Gait Distance (Feet): 2 Feet Assistive device: 1 person hand held assist Gait Pattern/deviations: Step-to pattern, Decreased stride length Gait velocity: decreased     General Gait Details: small lateral step along EOB due to elevated HR and pt fatigue. BP stable but HR remained elevated     Balance Overall balance assessment: Needs assistance Sitting-balance support: Single extremity supported Sitting balance-Leahy Scale: Fair Sitting balance - Comments: BUE support at EOB Postural control: Posterior lean Standing balance support: Single extremity supported, Reliant on assistive device for balance Standing balance-Leahy Scale: Poor Standing  balance comment: dependent on minA-modA at EOB. poor sanding tolerance                            Communication Communication Communication: Impaired Factors Affecting  Communication: Hearing impaired  Cognition Arousal: Alert Behavior During Therapy: WFL for tasks assessed/performed   PT - Cognitive impairments: No apparent impairments                         Following commands: Intact      Cueing Cueing Techniques: Verbal cues  Exercises General Exercises - Lower Extremity Ankle Circles/Pumps: AROM, Both, 10 reps, Supine Heel Raises: AROM, Both, 10 reps, Seated    General Comments General comments (skin integrity, edema, etc.): BP lowest in supine, pt reports nausea, cold, and extreme fatigue. poor sleep overnight      Pertinent Vitals/Pain Pain Assessment Pain Assessment: Faces Faces Pain Scale: Hurts a little bit Pain Location: generalized discomfort, nausea Pain Descriptors / Indicators: Discomfort Pain Intervention(s): Limited activity within patient's tolerance, Monitored during session, Repositioned     PT Goals (current goals can now be found in the care plan section) Acute Rehab PT Goals Patient Stated Goal: to get some sleep PT Goal Formulation: With patient Time For Goal Achievement: 06/20/24 Potential to Achieve Goals: Good Progress towards PT goals: Not progressing toward goals - comment (limited by vitals and fatigue)    Frequency    Min 2X/week       AM-PAC PT 6 Clicks Mobility   Outcome Measure  Help needed turning from your back to your side while in a flat bed without using bedrails?: A Little Help needed moving from lying on your back to sitting on the side of a flat bed without using bedrails?: A Little Help needed moving to and from a bed to a chair (including a wheelchair)?: A Lot Help needed standing up from a chair using your arms (e.g., wheelchair or bedside chair)?: A Lot Help needed to walk in hospital room?: Total Help needed climbing 3-5 steps with a railing? : Total 6 Click Score: 12    End of Session Equipment Utilized During Treatment: Gait belt;Oxygen Activity Tolerance: Patient  tolerated treatment well;Treatment limited secondary to medical complications (Comment) Patient left: in bed;with [name] bell/phone within reach;with bed alarm set Nurse Communication: Mobility status PT Visit Diagnosis: Unsteadiness on feet (R26.81);Other abnormalities of gait and mobility (R26.89);Muscle weakness (generalized) (M62.81)     Time: 9053-8996 PT Time Calculation (min) (ACUTE ONLY): 17 min  Charges:    $Therapeutic Exercise: 8-22 mins PT General Charges $$ ACUTE PT VISIT: 1 Visit                     Kristine [name], PT, DPT   Acute Rehabilitation Department Office [phone] Secure Chat Communication Preferred   Kristine [name] 06/08/2024, 10:55 AM

## 2024-06-08 NOTE — TOC Initial Note (Signed)
 Transition of Care [facility]) - Initial/Assessment Note    Patient Details  Name: Kristine [name] MRN: [id number] Date of Birth: [birth year]  Transition of Care [facility]) CM/SW Contact:    Isaiah Public, LCSWA Phone Number: 06/08/2024, 4:18 PM  Clinical Narrative:                  CSW received consult for possible SNF placement at time of discharge. CSW spoke with patient at bedside regarding PT recommendation of SNF placement at time of discharge. Patient request for CSW to call her daughter heather while in room to discuss dc plan/PT recommendations. CSW called patients daughter powell and discussed PT recommendations. Patient and patients daughter expressed understanding of PT recommendations and both in agreement for SNF placement for patient at time of discharge.  Patient reports prior to admission she comes from home alone.Patient gave CSW permission to fax out initial referral for SNF placement. CSW discussed insurance authorization process and will provide Medicare SNF ratings list with accepted SNF bed offers when available. All questions answered.No further questions reported at this time. CSW to continue to follow and assist with discharge planning needs.    Expected Discharge Plan: Skilled Nursing Facility Barriers to Discharge: Continued Medical Work up   Patient Goals and CMS Choice Patient states their goals for this hospitalization and ongoing recovery are:: SNF CMS Medicare.gov Compare Post Acute Care list provided to:: Patient Choice offered to / list presented to : Patient      Expected Discharge Plan and Services In-house Referral: Clinical Social Work     Living arrangements for the past 2 months: Single Family Home                                      Prior Living Arrangements/Services Living arrangements for the past 2 months: Single Family Home Lives with:: Self Patient language and need for interpreter reviewed:: Yes Do you feel safe going back to the place  where you live?: No   SNF  Need for Family Participation in Patient Care: Yes (Comment) Care giver support system in place?: Yes (comment)   Criminal Activity/Legal Involvement Pertinent to Current Situation/Hospitalization: No - Comment as needed  Activities of Daily Living   ADL Screening (condition at time of admission) Independently performs ADLs?: Yes (appropriate for developmental age) Is the patient deaf or have difficulty hearing?: No Does the patient have difficulty seeing, even when wearing glasses/contacts?: No Does the patient have difficulty concentrating, remembering, or making decisions?: No  Permission Sought/Granted Permission sought to share information with : Case Manager, Family Supports, Oceanographer granted to share information with : Yes, Verbal Permission Granted  Share Information with NAME: powell  Permission granted to share info w AGENCY: SNF  Permission granted to share info w Relationship: daughter  Permission granted to share info w Contact Information: powell [phone]  Emotional Assessment Appearance:: Appears stated age Attitude/Demeanor/Rapport: Gracious Affect (typically observed): Calm Orientation: : Oriented to Self, Oriented to Place, Oriented to  Time, Oriented to Situation Alcohol / Substance Use: Not Applicable Psych Involvement: No (comment)  Admission diagnosis:  Shortness of breath [R06.02] COPD exacerbation (HCC) [J44.1] Acute respiratory failure with hypoxia (HCC) [J96.01] Acute respiratory failure with hypercapnia (HCC) [J96.02] Patient Active Problem List   Diagnosis Date Noted   Syncope, vasovagal 06/06/2024   Acute on chronic diastolic CHF (congestive heart failure) (HCC) 06/06/2024   Dyspnea 06/05/2024  Acute respiratory failure with hypoxia (HCC) 06/04/2024   COPD exacerbation (HCC) 06/04/2024   Upper respiratory tract infection 09/27/2022   Mild intermittent reactive airway disease  07/24/2022   Chronic rhinitis 07/24/2022   Vertigo 07/24/2021   Pseudophakia of left eye 05/31/2021   Pseudophakia of right eye 05/31/2021   History of osteoporosis 05/27/2021   Mononucleosis 05/27/2021   Cystoid macular edema of left eye 10/11/2020   NSTEMI (non-ST elevated myocardial infarction) (HCC) 10/09/2020   COVID-19 virus infection 05/04/2020   Type 2 macular telangiectasis, right 03/20/2020   Cystoid macular edema of right eye 03/20/2020   Type 2 macular telangiectasis, left 03/20/2020   OSA on CPAP 03/20/2020   Cr(e)st syndrome (HCC) 05/25/2018   Ulcer of esophagus with bleeding 02/27/2018   Psychophysiological insomnia 09/24/2017   Abnormal nuclear cardiac imaging test 05/30/2017   Abnormal stress echo    Solar lentigo 11/08/2016   Acute blood loss anemia 10/10/2015   Red blood cell antibody positive 10/04/2015   S/P coronary artery stent placement 07/12/2014   CAD (coronary artery disease), native coronary artery 06/27/2013   Hyperlipidemia 06/27/2013   Chest pain of uncertain etiology 06/26/2013   History of ST elevation myocardial infarction (STEMI) 06/24/2013   Hypertension    Great toe pain 09/11/2012   H/O foot surgery 04/09/2012   Hallux varus, acquired 02/25/2012   GERD (gastroesophageal reflux disease)    Barrett's esophagus    Cystocele    Rectocele    PCP:  Ileen Rosaline NOVAK, NP Pharmacy:   [facility]) [place], [place] - [address] [facility] [address] [place] [address] [place] [address] Phone: [phone] Fax: [phone]  [facility] [id number] - [place], [place] - [address] [place] [address] Phone: [phone] Fax: [phone]  [facility] - [place],  - [address] [place]  [address] Phone: [phone] Fax: [phone]     Social Drivers of Health (SDOH) Social History: SDOH  Screenings   Food Insecurity: No Food Insecurity (06/04/2024)  Housing: Low Risk  (06/04/2024)  Transportation Needs: No Transportation Needs (06/04/2024)  Utilities: Not At Risk (06/04/2024)  Depression (PHQ2-9): Low Risk  (07/24/2021)  Recent Concern: Depression (PHQ2-9) - Medium Risk (05/25/2021)  Social Connections: Moderately Integrated (06/04/2024)  Stress: Stress Concern Present (03/26/2024)   Received from [facility]  Tobacco Use: Low Risk  (06/03/2024)   SDOH Interventions:     Readmission Risk Interventions     No data to display

## 2024-06-08 NOTE — Progress Notes (Addendum)
 PROGRESS NOTE    Kristine [name]  FMW:[id number] DOB: [birth year] DOA: 06/03/2024 PCP: Ileen Rosaline NOVAK, NP     Brief Narrative:   Kristine [name] is a 83 y.o. female with medical history significant of CREST syndrome, CAD, STEMI s/p DES to OM1 in 2014, HLD, HTN, ischemic colitis, Barrett's esophagus, OSA on CPAP, and OA presented with shortness of breath.  Had episode of syncope 7/13.  She reports that she was at the bedside commode, urinated, then developed flushing.  She became diaphoretic, lightheaded, had bradycardia down to heart rate of 29 before losing consciousness.  She recalls being back in bed and is able to relate the events that happened this morning.  Upon returning back to bed, her blood pressure and heart rate returned back to normal.  Blood sugar at this time was normal 141.  She had gotten Lasix  and metoprolol  7/12.  Patient underwent heart cath 7/14.   New events last 24 hours / Subjective: States that since she got back from the Cath Lab yesterday afternoon, has had nausea.  Currently feeling sick, gassy today   Assessment & Plan:   Principal Problem:   NSTEMI (non-ST elevated myocardial infarction) (HCC) Active Problems:   Hypertension   CAD (coronary artery disease), native coronary artery   Hyperlipidemia   OSA on CPAP   Cr(e)st syndrome (HCC)   Acute respiratory failure with hypoxia (HCC)   COPD exacerbation (HCC)   Dyspnea   Syncope, vasovagal   Acute on chronic diastolic CHF (congestive heart failure) (HCC)    NSTEMI History of CAD status post stent placement  - Troponin 718, 629, 546 - Cardiology signed off 7/14  - Echocardiogram showed EF 50-50 left-ventricle demonstrates regional wall motion abnl - Heart cath 7/14 showed non-obstructive disease  - Aspirin , pravastatin , metoprolol   - D dimer elevated. Ordered CTA chest to rule out PE   Vasovagal syncope - Currently holding metoprolol  and Lasix , given 500cc IVF 7/13 - Orthostatic vital sign showed  tachycardia with standing. Resume metoprolol   - Giving 1L IVF bolus today   Acute hypoxemic respiratory failure - IV Solu-Medrol  --> prednisone , Breo, duoneb   Leukocytosis - In setting of steroid use.  Continue to monitor  Acute on chronic diastolic heart failure - Currently holding Lasix  given her syncopal episode  Achalasia - CT showed stable dilated esophagus with air-fluid level - GI consulted 7/12: they are not planning any further workup in the hospital.  She does have an outpatient GI doctor follow-up in August  CREST syndrome -Outpatient rheumatology appointment in August  OSA - CPAP nightly  Hyperlipidemia - Pravastatin   Stable 2 mm lung nodules - Outpatient follow-up  Hypokalemia - Replace     DVT prophylaxis: Lovenox   Code Status: Full code Family Communication: No family at bedside Disposition Plan: Home Status is: Inpatient Remains inpatient appropriate because: CTA chest    Antimicrobials:  Anti-infectives (From admission, onward)    Start     Dose/Rate Route Frequency Ordered Stop   06/05/24 0800  cefTRIAXone  (ROCEPHIN ) 2 g in sodium chloride  0.9 % 100 mL IVPB  Status:  Discontinued        2 g 200 mL/hr over 30 Minutes Intravenous Every 24 hours 06/04/24 0756 06/04/24 1430        Objective: Vitals:   06/08/24 0508 06/08/24 0732 06/08/24 1105 06/08/24 1108  BP: 130/71 (!) 153/60 110/66 110/66  Pulse: 98 99 96 (!) 102  Resp: 16 18 16    Temp: 98.5 F (36.9 C) 98.3  F (36.8 C) 98.4 F (36.9 C)   TempSrc: Oral Oral Oral   SpO2: 100% 97% 98%   Weight:      Height:        Intake/Output Summary (Last 24 hours) at 06/08/2024 1212 Last data filed at 06/08/2024 0957 Gross per 24 hour  Intake 647.11 ml  Output 650 ml  Net -2.89 ml   Filed Weights   06/04/24 1609 06/07/24 0500  Weight: 48.8 kg 47.5 kg    Examination:  General exam: Appears calm Respiratory system: Clear to auscultation. Respiratory effort normal. No respiratory  distress. No conversational dyspnea.  Cardiovascular system: S1 & S2 heard, RRR.  Gastrointestinal system: Abdomen is nondistended, soft Central nervous system: Alert and oriented. No focal neurological deficits. Speech clear.  Extremities: Symmetric in appearance  Skin: No rashes, lesions or ulcers on exposed skin  Psychiatry: Judgement and insight appear normal. Mood & affect appropriate.   Data Reviewed: I have personally reviewed following labs and imaging studies  CBC: Recent Labs  Lab 06/03/24 2351 06/04/24 0026 06/04/24 0238 06/04/24 0755 06/06/24 0209 06/07/24 0513 06/08/24 0453  WBC 9.6  --   --  11.9* 14.7* 17.2* 19.1*  HGB 10.6*   < > 11.2* 10.6* 10.2* 9.2* 8.8*  HCT 35.8*   < > 33.0* 36.2 34.3* 31.3* 30.5*  MCV 75.1*  --   --  75.9* 74.9* 75.6* 76.8*  PLT 199  --   --  217 240 228 228   < > = values in this interval not displayed.   Basic Metabolic Panel: Recent Labs  Lab 06/04/24 0755 06/05/24 0430 06/06/24 2143 06/07/24 0513 06/08/24 0453  NA 141 137 139 140 142  K 3.6 4.1 4.0 3.2* 3.3*  CL 104 104 99 99 104  CO2 25 25 31  32 28  GLUCOSE 158* 120* 133* 140* 156*  BUN 8 15 28* 26* 23  CREATININE 0.68 0.71 0.66 0.62 0.64  CALCIUM  9.8 9.2 9.4 9.3 9.2  MG  --  2.1 2.2  --  2.1   GFR: Estimated Creatinine Clearance: 39.6 mL/min (by C-G formula based on SCr of 0.64 mg/dL). Liver Function Tests: Recent Labs  Lab 06/04/24 0007  AST 20  ALT 12  ALKPHOS 87  BILITOT <0.2  PROT 6.7  ALBUMIN 4.3   Recent Labs  Lab 06/04/24 0007  LIPASE 54*   No results for input(s): AMMONIA in the last 168 hours. Coagulation Profile: No results for input(s): INR, PROTIME in the last 168 hours. Cardiac Enzymes: No results for input(s): CKTOTAL, CKMB, CKMBINDEX, TROPONINI in the last 168 hours. BNP (last 3 results) No results for input(s): PROBNP in the last 8760 hours. HbA1C: No results for input(s): HGBA1C in the last 72 hours. CBG: Recent  Labs  Lab 06/04/24 0130 06/06/24 0624  GLUCAP 165* 141*   Lipid Profile: Recent Labs    06/07/24 0513  CHOL 161  HDL 47  LDLCALC 87  TRIG 136  CHOLHDL 3.4   Thyroid  Function Tests: No results for input(s): TSH, T4TOTAL, FREET4, T3FREE, THYROIDAB in the last 72 hours. Anemia Panel: No results for input(s): VITAMINB12, FOLATE, FERRITIN, TIBC, IRON , RETICCTPCT in the last 72 hours. Sepsis Labs: No results for input(s): PROCALCITON, LATICACIDVEN in the last 168 hours.  Recent Results (from the past 240 hours)  Respiratory (~20 pathogens) panel by PCR     Status: None   Collection Time: 06/04/24  7:57 AM   Specimen: Nasopharyngeal Swab; Respiratory  Result Value Ref Range  Status   Adenovirus NOT DETECTED NOT DETECTED Final   Coronavirus 229E NOT DETECTED NOT DETECTED Final    Comment: (NOTE) The Coronavirus on the Respiratory Panel, DOES NOT test for the novel  Coronavirus (2019 nCoV)    Coronavirus HKU1 NOT DETECTED NOT DETECTED Final   Coronavirus NL63 NOT DETECTED NOT DETECTED Final   Coronavirus OC43 NOT DETECTED NOT DETECTED Final   Metapneumovirus NOT DETECTED NOT DETECTED Final   Rhinovirus / Enterovirus NOT DETECTED NOT DETECTED Final   Influenza A NOT DETECTED NOT DETECTED Final   Influenza B NOT DETECTED NOT DETECTED Final   Parainfluenza Virus 1 NOT DETECTED NOT DETECTED Final   Parainfluenza Virus 2 NOT DETECTED NOT DETECTED Final   Parainfluenza Virus 3 NOT DETECTED NOT DETECTED Final   Parainfluenza Virus 4 NOT DETECTED NOT DETECTED Final   Respiratory Syncytial Virus NOT DETECTED NOT DETECTED Final   Bordetella pertussis NOT DETECTED NOT DETECTED Final   Bordetella Parapertussis NOT DETECTED NOT DETECTED Final   Chlamydophila pneumoniae NOT DETECTED NOT DETECTED Final   Mycoplasma pneumoniae NOT DETECTED NOT DETECTED Final    Comment: Performed at [facility], [address] [place], [place] [address]      Radiology  Studies: CARDIAC CATHETERIZATION Result Date: 06/07/2024 Conclusions: Stable appearance of coronary arteries compared with last catheterization in 2018; there is mild-moderate, non-obstructive coronary artery disease involving the mid/distal LAD, mid LCx, and proximal/mid LAD. Widely patent overlapping LCx/OM1 stents. Normal left ventricular filling pressure (LVEDP 10 mmHg). Recommendations: Continue secondary prevention of coronary artery disease. Consider further evaluation for there potential causes of shortness of breath and elevated HS-TnI/BNP, including pulmonary embolism. Kristine Hanson, MD [facility]  ECHOCARDIOGRAM COMPLETE Result Date: 06/07/2024    ECHOCARDIOGRAM REPORT   Patient Name:   [facility] Date of Exam: 06/07/2024 Medical Rec #:  [id number]   Height:       60.0 in Accession #:    [id number]  Weight:       104.7 lb Date of Birth:  [birth year]    BSA:          1.418 m Patient Age:    83 years    BP:           120/63 mmHg Patient Gender: F           HR:           89 bpm. Exam Location:  Inpatient Procedure: 2D Echo, 3D Echo, Color Doppler, Cardiac Doppler and Strain Analysis            (Both Spectral and Color Flow Doppler were utilized during            procedure). Indications:    Myocardial infarct I21.9  History:        Patient has prior history of Echocardiogram examinations, most                 recent 11/20/2023. CAD, Prior CABG, Signs/Symptoms:Dyspnea; Risk                 Factors:Sleep Apnea, Hypertension and Dyslipidemia.  Sonographer:    Kristine [name] RDCS Referring Phys: [id number] MAUDE JAYSON EMMER  Sonographer Comments: Global longitudinal strain was attempted. IMPRESSIONS  1. Left ventricular ejection fraction, by estimation, is 50 to 55%. The left ventricle has low normal function. The left ventricle demonstrates regional wall motion abnormalities (see scoring diagram/findings for description). Left ventricular diastolic  parameters were normal. The average left ventricular global  longitudinal strain  is -14.3 %. The global longitudinal strain is abnormal.  2. Right ventricular systolic function is normal. The right ventricular size is normal. There is normal pulmonary artery systolic pressure. The estimated right ventricular systolic pressure is 21.0 mmHg.  3. The mitral valve is grossly normal. No evidence of mitral valve regurgitation. No evidence of mitral stenosis.  4. The aortic valve is tricuspid. Aortic valve regurgitation is mild. Aortic valve sclerosis is present, with no evidence of aortic valve stenosis.  5. The inferior vena cava is normal in size with greater than 50% respiratory variability, suggesting right atrial pressure of 3 mmHg. Comparison(s): A prior study was performed on 10/21/2023. No change in LVEF, subtle RWMA as noted above. FINDINGS  Left Ventricle: Left ventricular ejection fraction, by estimation, is 50 to 55%. The left ventricle has low normal function. The left ventricle demonstrates regional wall motion abnormalities. The average left ventricular global longitudinal strain is -14.3 %. Strain was performed and the global longitudinal strain is abnormal. 3D ejection fraction reviewed and evaluated as part of the interpretation. Alternate measurement of EF is felt to be most reflective of LV function. The left ventricular internal cavity size was small. There is borderline left ventricular hypertrophy. Left ventricular diastolic parameters were normal.  LV Wall Scoring: The mid and distal lateral wall and mid anterolateral segment are hypokinetic. Right Ventricle: The right ventricular size is normal. No increase in right ventricular wall thickness. Right ventricular systolic function is normal. There is normal pulmonary artery systolic pressure. The tricuspid regurgitant velocity is 2.12 m/s, and  with an assumed right atrial pressure of 3 mmHg, the estimated right ventricular systolic pressure is 21.0 mmHg. Left Atrium: Left atrial size was normal in size.  Right Atrium: Right atrial size was normal in size. Pericardium: There is no evidence of pericardial effusion. Mitral Valve: The mitral valve is grossly normal. No evidence of mitral valve regurgitation. No evidence of mitral valve stenosis. Tricuspid Valve: The tricuspid valve is normal in structure. Tricuspid valve regurgitation is mild . No evidence of tricuspid stenosis. Aortic Valve: The aortic valve is tricuspid. Aortic valve regurgitation is mild. Aortic valve sclerosis is present, with no evidence of aortic valve stenosis. Pulmonic Valve: The pulmonic valve was normal in structure. Pulmonic valve regurgitation is not visualized. No evidence of pulmonic stenosis. Aorta: The aortic root and ascending aorta are structurally normal, with no evidence of dilitation. Venous: The inferior vena cava is normal in size with greater than 50% respiratory variability, suggesting right atrial pressure of 3 mmHg. IAS/Shunts: The interatrial septum was not well visualized.  LEFT VENTRICLE PLAX 2D LVIDd:         3.80 cm   Diastology LVIDs:         3.00 cm   LV e' medial:    4.90 cm/s LV PW:         1.10 cm   LV E/e' medial:  9.7 LV IVS:        1.20 cm   LV e' lateral:   5.77 cm/s LVOT diam:     2.00 cm   LV E/e' lateral: 8.3 LV SV:         50 LV SV Index:   35        2D Longitudinal Strain LVOT Area:     3.14 cm  2D Strain GLS Avg:     -14.3 %  3D Volume EF:                          3D EF:        45 %                          LV EDV:       89 ml                          LV ESV:       49 ml                          LV SV:        40 ml RIGHT VENTRICLE             IVC RV Basal diam:  3.20 cm     IVC diam: 0.70 cm RV S prime:     17.30 cm/s TAPSE (M-mode): 2.1 cm LEFT ATRIUM             Index        RIGHT ATRIUM          Index LA diam:        2.70 cm 1.90 cm/m   RA Area:     8.64 cm LA Vol (A2C):   26.3 ml 18.55 ml/m  RA Volume:   15.80 ml 11.14 ml/m LA Vol (A4C):   22.0 ml 15.52 ml/m LA Biplane  Vol: 25.4 ml 17.91 ml/m  AORTIC VALVE LVOT Vmax:   94.30 cm/s LVOT Vmean:  66.000 cm/s LVOT VTI:    0.159 m  AORTA Ao Root diam: 2.80 cm Ao Asc diam:  2.90 cm MITRAL VALVE               TRICUSPID VALVE MV Area (PHT): 2.87 cm    TR Peak grad:   18.0 mmHg MV Decel Time: 264 msec    TR Vmax:        212.00 cm/s MV E velocity: 47.70 cm/s MV A velocity: 76.00 cm/s  SHUNTS MV E/A ratio:  0.63        Systemic VTI:  0.16 m                            Systemic Diam: 2.00 cm Sunit Tolia Electronically signed by Kristine [name] Signature Date/Time: 06/07/2024/1:35:36 PM    Final       Scheduled Meds:  aspirin  EC  81 mg Oral Daily   enoxaparin  (LOVENOX ) injection  40 mg Subcutaneous Q24H   feeding supplement  237 mL Oral BID BM   fluticasone  furoate-vilanterol  1 puff Inhalation Daily   metoprolol  succinate  12.5 mg Oral Daily   pantoprazole   40 mg Oral Daily   pravastatin   40 mg Oral QPM   predniSONE   40 mg Oral Q breakfast   sodium chloride  flush  3 mL Intravenous Q12H   Continuous Infusions:  sodium chloride        LOS: 4 days   Time spent: 25 minutes   Kristine Hoe, DO [facility] 06/08/2024, 12:12 PM   Available via Epic secure chat 7am-7pm After these hours, please refer to coverage provider listed on [email or web address]

## 2024-06-08 NOTE — Plan of Care (Signed)
  Problem: Nutrition: Goal: Adequate nutrition will be maintained Outcome: Progressing   Problem: Activity: Goal: Risk for activity intolerance will decrease Outcome: Progressing   Problem: Elimination: Goal: Will not experience complications related to bowel motility Outcome: Progressing Goal: Will not experience complications related to urinary retention Outcome: Progressing   Problem: Pain Managment: Goal: General experience of comfort will improve and/or be controlled Outcome: Progressing   Problem: Pain Managment: Goal: General experience of comfort will improve and/or be controlled Outcome: Progressing

## 2024-06-08 NOTE — Progress Notes (Addendum)
 Rounding Note   Patient Name: Kristine [name] Date of Encounter: 06/08/2024  [facility] Cardiologist: Kristine Fell, MD   Subjective Feeling poorly.  +nausea and weakness  Scheduled Meds:  aspirin  EC  81 mg Oral Daily   enoxaparin  (LOVENOX ) injection  40 mg Subcutaneous Q24H   feeding supplement  237 mL Oral BID BM   fluticasone  furoate-vilanterol  1 puff Inhalation Daily   pantoprazole   40 mg Oral Daily   pravastatin   20 mg Oral QPM   predniSONE   40 mg Oral Q breakfast   sodium chloride  flush  3 mL Intravenous Q12H   Continuous Infusions:  sodium chloride      PRN Meds: sodium chloride , acetaminophen , alum & mag hydroxide-simeth, ipratropium-albuterol , ondansetron  (ZOFRAN ) IV, sodium chloride  flush, traMADol    Vital Signs  Vitals:   06/07/24 2011 06/08/24 0045 06/08/24 0508 06/08/24 0732  BP: (!) 141/70 (!) 131/52 130/71 (!) 153/60  Pulse: (!) 110  98 99  Resp: 17 16 16 18   Temp: 97.8 F (36.6 C) 98 F (36.7 C) 98.5 F (36.9 C) 98.3 F (36.8 C)  TempSrc: Oral Oral Oral Oral  SpO2:   100% 97%  Weight:      Height:        Intake/Output Summary (Last 24 hours) at 06/08/2024 0833 Last data filed at 06/08/2024 0510 Gross per 24 hour  Intake 567.11 ml  Output 650 ml  Net -82.89 ml      06/07/2024    5:00 AM 06/04/2024    4:09 PM 12/12/2023    7:47 PM  Last 3 Weights  Weight (lbs) 104 lb 11.5 oz 107 lb 9.6 oz 110 lb  Weight (kg) 47.5 kg 48.807 kg 49.896 kg      Telemetry Sinus rhythm, sinus tachycardia.  PVCs.  Personally Reviewed  ECG   - Personally Reviewed  Physical Exam  VS:  BP (!) 153/60 (BP Location: Right Arm)   Pulse 99   Temp 98.3 F (36.8 C) (Oral)   Resp 18   Ht 5' (1.524 m)   Wt 47.5 kg   LMP 11/25/1994   SpO2 97%   BMI 20.45 kg/m  , BMI Body mass index is 20.45 kg/m. GENERAL:  Well appearing HEENT: Pupils equal round and reactive, fundi not visualized, oral mucosa unremarkable NECK:  No jugular venous distention, waveform  within normal limits, carotid upstroke brisk and symmetric, no bruits, no thyromegaly LUNGS:  Clear to auscultation bilaterally HEART:  Mostly regular with occasional ectopy.  PMI not displaced or sustained,S1 and S2 within normal limits, no S3, no S4, no clicks, no rubs, no murmurs ABD:  Flat, positive bowel sounds normal in frequency in pitch, no bruits, no rebound, no guarding, no midline pulsatile mass, no hepatomegaly, no splenomegaly EXT:  2 plus pulses throughout, no edema, no cyanosis no clubbing SKIN:  No rashes no nodules NEURO:  Cranial nerves II through XII grossly intact, motor grossly intact throughout PSYCH:  Cognitively intact, oriented to person place and time  Labs High Sensitivity Troponin:   Recent Labs  Lab 06/05/24 1444 06/05/24 1640 06/06/24 0502  TROPONINIHS 718* 629* 546*     Chemistry Recent Labs  Lab 06/04/24 0007 06/04/24 0026 06/05/24 0430 06/06/24 2143 06/07/24 0513 06/08/24 0453  NA  --    < > 137 139 140 142  K  --    < > 4.1 4.0 3.2* 3.3*  CL  --    < > 104 99 99 104  CO2  --    < >  25 31 32 28  GLUCOSE  --    < > 120* 133* 140* 156*  BUN  --    < > 15 28* 26* 23  CREATININE  --    < > 0.71 0.66 0.62 0.64  CALCIUM   --    < > 9.2 9.4 9.3 9.2  MG  --   --  2.1 2.2  --  2.1  PROT 6.7  --   --   --   --   --   ALBUMIN 4.3  --   --   --   --   --   AST 20  --   --   --   --   --   ALT 12  --   --   --   --   --   ALKPHOS 87  --   --   --   --   --   BILITOT <0.2  --   --   --   --   --   GFRNONAA  --    < > >60 >60 >60 >60  ANIONGAP  --    < > 8 9 9 10    < > = values in this interval not displayed.    Lipids  Recent Labs  Lab 06/07/24 0513  CHOL 161  TRIG 136  HDL 47  LDLCALC 87  CHOLHDL 3.4    Hematology Recent Labs  Lab 06/06/24 0209 06/07/24 0513 06/08/24 0453  WBC 14.7* 17.2* 19.1*  RBC 4.58 4.14 3.97  HGB 10.2* 9.2* 8.8*  HCT 34.3* 31.3* 30.5*  MCV 74.9* 75.6* 76.8*  MCH 22.3* 22.2* 22.2*  MCHC 29.7* 29.4* 28.9*   RDW 19.0* 19.3* 19.6*  PLT 240 228 228   Thyroid  No results for input(s): TSH, FREET4 in the last 168 hours.  BNP Recent Labs  Lab 06/04/24 1831  BNP 1,207.9*    DDimer No results for input(s): DDIMER in the last 168 hours.   Radiology  CARDIAC CATHETERIZATION Result Date: 06/07/2024 Conclusions: Stable appearance of coronary arteries compared with last catheterization in 2018; there is mild-moderate, non-obstructive coronary artery disease involving the mid/distal LAD, mid LCx, and proximal/mid LAD. Widely patent overlapping LCx/OM1 stents. Normal left ventricular filling pressure (LVEDP 10 mmHg). Recommendations: Continue secondary prevention of coronary artery disease. Consider further evaluation for there potential causes of shortness of breath and elevated HS-TnI/BNP, including pulmonary embolism. Kristine Hanson, MD [facility]  ECHOCARDIOGRAM COMPLETE Result Date: 06/07/2024    ECHOCARDIOGRAM REPORT   Patient Name:   Kristine [name] Date of Exam: 06/07/2024 Medical Rec #:  [id number]   Height:       60.0 in Accession #:    [id number]  Weight:       104.7 lb Date of Birth:  Feb 25, 1941    BSA:          1.418 m Patient Age:    83 years    BP:           120/63 mmHg Patient Gender: F           HR:           89 bpm. Exam Location:  Inpatient Procedure: 2D Echo, 3D Echo, Color Doppler, Cardiac Doppler and Strain Analysis            (Both Spectral and Color Flow Doppler were utilized during            procedure). Indications:    Myocardial infarct I21.9  History:        Patient has prior history of Echocardiogram examinations, most                 recent 11/20/2023. CAD, Prior CABG, Signs/Symptoms:Dyspnea; Risk                 Factors:Sleep Apnea, Hypertension and Dyslipidemia.  Sonographer:    Koleen Popper RDCS Referring Phys: [id number] Kristine [name]  Sonographer Comments: Global longitudinal strain was attempted. IMPRESSIONS  1. Left ventricular ejection fraction, by estimation, is 50 to 55%. The  left ventricle has low normal function. The left ventricle demonstrates regional wall motion abnormalities (see scoring diagram/findings for description). Left ventricular diastolic  parameters were normal. The average left ventricular global longitudinal strain is -14.3 %. The global longitudinal strain is abnormal.  2. Right ventricular systolic function is normal. The right ventricular size is normal. There is normal pulmonary artery systolic pressure. The estimated right ventricular systolic pressure is 21.0 mmHg.  3. The mitral valve is grossly normal. No evidence of mitral valve regurgitation. No evidence of mitral stenosis.  4. The aortic valve is tricuspid. Aortic valve regurgitation is mild. Aortic valve sclerosis is present, with no evidence of aortic valve stenosis.  5. The inferior vena cava is normal in size with greater than 50% respiratory variability, suggesting right atrial pressure of 3 mmHg. Comparison(s): A prior study was performed on 10/21/2023. No change in LVEF, subtle RWMA as noted above. FINDINGS  Left Ventricle: Left ventricular ejection fraction, by estimation, is 50 to 55%. The left ventricle has low normal function. The left ventricle demonstrates regional wall motion abnormalities. The average left ventricular global longitudinal strain is -14.3 %. Strain was performed and the global longitudinal strain is abnormal. 3D ejection fraction reviewed and evaluated as part of the interpretation. Alternate measurement of EF is felt to be most reflective of LV function. The left ventricular internal cavity size was small. There is borderline left ventricular hypertrophy. Left ventricular diastolic parameters were normal.  LV Wall Scoring: The mid and distal lateral wall and mid anterolateral segment are hypokinetic. Right Ventricle: The right ventricular size is normal. No increase in right ventricular wall thickness. Right ventricular systolic function is normal. There is normal pulmonary  artery systolic pressure. The tricuspid regurgitant velocity is 2.12 m/s, and  with an assumed right atrial pressure of 3 mmHg, the estimated right ventricular systolic pressure is 21.0 mmHg. Left Atrium: Left atrial size was normal in size. Right Atrium: Right atrial size was normal in size. Pericardium: There is no evidence of pericardial effusion. Mitral Valve: The mitral valve is grossly normal. No evidence of mitral valve regurgitation. No evidence of mitral valve stenosis. Tricuspid Valve: The tricuspid valve is normal in structure. Tricuspid valve regurgitation is mild . No evidence of tricuspid stenosis. Aortic Valve: The aortic valve is tricuspid. Aortic valve regurgitation is mild. Aortic valve sclerosis is present, with no evidence of aortic valve stenosis. Pulmonic Valve: The pulmonic valve was normal in structure. Pulmonic valve regurgitation is not visualized. No evidence of pulmonic stenosis. Aorta: The aortic root and ascending aorta are structurally normal, with no evidence of dilitation. Venous: The inferior vena cava is normal in size with greater than 50% respiratory variability, suggesting right atrial pressure of 3 mmHg. IAS/Shunts: The interatrial septum was not well visualized.  LEFT VENTRICLE PLAX 2D LVIDd:         3.80 cm   Diastology LVIDs:  3.00 cm   LV e' medial:    4.90 cm/s LV PW:         1.10 cm   LV E/e' medial:  9.7 LV IVS:        1.20 cm   LV e' lateral:   5.77 cm/s LVOT diam:     2.00 cm   LV E/e' lateral: 8.3 LV SV:         50 LV SV Index:   35        2D Longitudinal Strain LVOT Area:     3.14 cm  2D Strain GLS Avg:     -14.3 %                           3D Volume EF:                          3D EF:        45 %                          LV EDV:       89 ml                          LV ESV:       49 ml                          LV SV:        40 ml RIGHT VENTRICLE             IVC RV Basal diam:  3.20 cm     IVC diam: 0.70 cm RV S prime:     17.30 cm/s TAPSE (M-mode): 2.1 cm  LEFT ATRIUM             Index        RIGHT ATRIUM          Index LA diam:        2.70 cm 1.90 cm/m   RA Area:     8.64 cm LA Vol (A2C):   26.3 ml 18.55 ml/m  RA Volume:   15.80 ml 11.14 ml/m LA Vol (A4C):   22.0 ml 15.52 ml/m LA Biplane Vol: 25.4 ml 17.91 ml/m  AORTIC VALVE LVOT Vmax:   94.30 cm/s LVOT Vmean:  66.000 cm/s LVOT VTI:    0.159 m  AORTA Ao Root diam: 2.80 cm Ao Asc diam:  2.90 cm MITRAL VALVE               TRICUSPID VALVE MV Area (PHT): 2.87 cm    TR Peak grad:   18.0 mmHg MV Decel Time: 264 msec    TR Vmax:        212.00 cm/s MV E velocity: 47.70 cm/s MV A velocity: 76.00 cm/s  SHUNTS MV E/A ratio:  0.63        Systemic VTI:  0.16 m                            Systemic Diam: 2.00 cm Sunit Tolia Electronically signed by Madonna Large Signature Date/Time: 06/07/2024/1:35:36 PM    Final     Cardiac Studies Echo 10/2023: 1. Left ventricular ejection fraction, by estimation, is 50 to 55%. The  left ventricle  has low normal function. The left ventricle has no regional  wall motion abnormalities. There is mild asymmetric left ventricular  hypertrophy of the basal-septal  segment. Left ventricular diastolic parameters were normal.   2. Right ventricular systolic function is normal. The right ventricular  size is normal.   3. The mitral valve is normal in structure. Trivial mitral valve  regurgitation. No evidence of mitral stenosis.   4. The aortic valve is tricuspid. There is mild calcification of the  aortic valve. Aortic valve regurgitation is trivial. No aortic stenosis is  present.   LHC 06/07/24: Conclusions: Stable appearance of coronary arteries compared with last catheterization in 2018; there is mild-moderate, non-obstructive coronary artery disease involving the mid/distal LAD, mid LCx, and proximal/mid LAD. Widely patent overlapping LCx/OM1 stents. Normal left ventricular filling pressure (LVEDP 10 mmHg).   Recommendations: Continue secondary prevention of coronary artery  disease. Consider further evaluation for there potential causes of shortness of breath and elevated HS-TnI/BNP, including pulmonary embolism.    Echo 06/07/24: IMPRESSIONS    1. Left ventricular ejection fraction, by estimation, is 50 to 55%. The  left ventricle has low normal function. The left ventricle demonstrates  regional wall motion abnormalities (see scoring diagram/findings for  description). Left ventricular diastolic   parameters were normal. The average left ventricular global longitudinal  strain is -14.3 %. The global longitudinal strain is abnormal.   2. Right ventricular systolic function is normal. The right ventricular  size is normal. There is normal pulmonary artery systolic pressure. The  estimated right ventricular systolic pressure is 21.0 mmHg.   3. The mitral valve is grossly normal. No evidence of mitral valve  regurgitation. No evidence of mitral stenosis.   4. The aortic valve is tricuspid. Aortic valve regurgitation is mild.  Aortic valve sclerosis is present, with no evidence of aortic valve  stenosis.   5. The inferior vena cava is normal in size with greater than 50%  respiratory variability, suggesting right atrial pressure of 3 mmHg.    Patient Profile   83 y.o. female with CAD, STEMI s/p LCX PCI 2018, hypertension, hyperlipidemia, ischemic colitis, OSA on CPAP admitted with NSTEMI.   Assessment & Plan  # NSTEMI:  # Hyperlipidemia: Hs troponin elevated to 718.  Cath revealed stable non-obstructive disease. LDL 87.  Will increase pravastatin  to 40mg .  Repeat lipids/CMP as outpatient.  Continue aspirin .  Resume metoprolol .  Unclear why troponin was elevated.  Add d-dimer.     # Murmur:  Echo unremarkable.    # Syncope: Episode of syncope AM 7/13.  Her metoprolol  has been held. She received IV fluids. She has not been eating or drinking well.  Was NPO most of yesteray and nauseous today.  Oral mucosa dry.   Encouraged hydration.  Will give 1L NS  bolus.    # Hypertension: BP is very labile ranging 115-150.  Hydrate and resume metoprolol  as above.   # PVCs:  Frequent PVCs and short runs of tachcyardia on telemetry.  She needs metoprolol  but currently held for syncope.  Recommend IV fluids and encouraged hydration.  Cath was without obstructive disease.  Will give metoprolol  as above.    [facility] will sign off.   Medication Recommendations:  Resume metoprolol .  Increase pravastatin .  Other recommendations (labs, testing, etc):  d-dimer.  Out patient lipids/CMP in 2-3 months.   Follow up as an outpatient:  we will arrange    For questions or updates, please contact [facility] Please  consult [email or web address] for contact info under     Signed, Annabella Scarce, MD  06/08/2024, 8:33 AM

## 2024-06-09 DIAGNOSIS — I214 Non-ST elevation (NSTEMI) myocardial infarction: Secondary | ICD-10-CM | POA: Diagnosis not present

## 2024-06-09 LAB — CBC
HCT: 28.7 % — ABNORMAL LOW (ref 36.0–46.0)
Hemoglobin: 8.3 g/dL — ABNORMAL LOW (ref 12.0–15.0)
MCH: 22.1 pg — ABNORMAL LOW (ref 26.0–34.0)
MCHC: 28.9 g/dL — ABNORMAL LOW (ref 30.0–36.0)
MCV: 76.5 fL — ABNORMAL LOW (ref 80.0–100.0)
Platelets: 235 K/uL (ref 150–400)
RBC: 3.75 MIL/uL — ABNORMAL LOW (ref 3.87–5.11)
RDW: 19.9 % — ABNORMAL HIGH (ref 11.5–15.5)
WBC: 18.2 K/uL — ABNORMAL HIGH (ref 4.0–10.5)
nRBC: 0 % (ref 0.0–0.2)

## 2024-06-09 LAB — BASIC METABOLIC PANEL WITH GFR
Anion gap: 8 (ref 5–15)
BUN: 18 mg/dL (ref 8–23)
CO2: 26 mmol/L (ref 22–32)
Calcium: 8.5 mg/dL — ABNORMAL LOW (ref 8.9–10.3)
Chloride: 102 mmol/L (ref 98–111)
Creatinine, Ser: 0.57 mg/dL (ref 0.44–1.00)
GFR, Estimated: >60 mL/min (ref >60)
Glucose, Bld: 128 mg/dL — ABNORMAL HIGH (ref 70–99)
Potassium: 4.1 mmol/L (ref 3.5–5.1)
Sodium: 136 mmol/L (ref 135–145)

## 2024-06-09 LAB — LIPOPROTEIN A (LPA): Lipoprotein (a): <8.4 nmol/L (ref <75.0)

## 2024-06-09 MED ORDER — DICYCLOMINE HCL 10 MG PO CAPS
10.0000 mg | ORAL_CAPSULE | Freq: Once | ORAL | Status: AC
  Administered 2024-06-09: 10 mg via ORAL
  Filled 2024-06-09: qty 1

## 2024-06-09 MED ORDER — FAMOTIDINE IN NACL 20-0.9 MG/50ML-% IV SOLN
20.0000 mg | Freq: Once | INTRAVENOUS | Status: AC
  Administered 2024-06-09: 20 mg via INTRAVENOUS
  Filled 2024-06-09: qty 50

## 2024-06-09 NOTE — Progress Notes (Signed)
 Patient is c/o epigastric pain. she states it is a burning sensation Kristine [name] has been ongoing for four days. Maalox is not relieving pain/burning. Howerter MD notified

## 2024-06-09 NOTE — Progress Notes (Addendum)
 Physical Therapy Treatment Patient Details Name: Kristine [name] MRN: [id number] DOB: 02-22-1941 Today's Date: 06/09/2024   History of Present Illness Pt is 83 yo presenting to [facility] via transfer from [facility] on 7/10 due to shortness of breath. Work up revealed NSTEMI, and acute respiratory failure with hypoxia due to COPD exacerbation. S/p LHC on 7/14. PMH: CREST syndrome, CAD, STEMI s/p DES to OM1 in 2014, HLD, HTN, ischemic colitis, Barrett's esophagus, OSA on CPAP, and OA.    PT Comments  Pt agreeable to session despite reports of continued fatigue this afternoon. Pt unaware of what time it is due to sleeping most of morning but otherwise oriented. However, pt with other cognition issues noted repeatedly in session today, especially with memory, awareness, problem solving, and sequencing tasks. The pt needed repeated cues to avoid obstacles with RW and ran her hand into countertop multiple times. The pt's vitals were more stable this session, but mobility limited by urinary incontinence and HR elevation to 123bpm with self-care activities. The pt also continues to have soft BP with minor drops but MAP >64. Continue to recommend post-acute inpatient therapies after d/c to facilitate improved strength, power, stability, and endurance.    VITALS:  - supine in bed - BP: 115/62 (76); HR: 103bpm - sitting EOB - BP: 124/64 (78); HR: 101bpm - standing - BP: 98/48 (64); HR: 115bpm - standing after 3 min - BP: 110/60 (76); HR: 110bpm - standing after self-care activity - BP: 95/58 (70); HR: 120bpm - supine in bed - BP: 114/77 (87); HR: 102bpm     If plan is discharge home, recommend the following: A lot of help with bathing/dressing/bathroom;Assistance with cooking/housework;Direct supervision/assist for medications management;Direct supervision/assist for financial management;Assist for transportation;Help with stairs or ramp for entrance;Supervision due to cognitive status;A lot of help with  walking and/or transfers   Can travel by private vehicle     Yes  Equipment Recommendations  Rolling walker (2 wheels);Wheelchair (measurements PT);Wheelchair cushion (measurements PT)    Recommendations for Other Services       Precautions / Restrictions Precautions Precautions: Fall;Other (comment) Recall of Precautions/Restrictions: Intact Precaution/Restrictions Comments: watch BP and HR Restrictions Weight Bearing Restrictions Per Provider Order: No     Mobility  Bed Mobility Overal bed mobility: Needs Assistance Bed Mobility: Supine to Sit, Sit to Supine     Supine to sit: Min assist, Used rails, +2 for physical assistance, HOB elevated Sit to supine: Min assist, Used rails, HOB elevated   General bed mobility comments: minA to manage trunk, pt with continued R lateral lean in sitting and did not complete scoot to EOB despite cues. minA to LE to return to bed    Transfers Overall transfer level: Needs assistance Equipment used: Rolling walker (2 wheels) Transfers: Sit to/from Stand, Bed to chair/wheelchair/BSC Sit to Stand: Min assist   Step pivot transfers: Mod assist       General transfer comment: minA to rise, minA to steady in stance. dependent on at least single UE support. modA to manage RW and cues for stepping with turns    Ambulation/Gait Ambulation/Gait assistance: Mod assist Gait Distance (Feet): 6 Feet (+ 3ft) Assistive device: Rolling walker (2 wheels) Gait Pattern/deviations: Step-to pattern, Decreased stride length, Shuffle, Trunk flexed, Narrow base of support Gait velocity: decreased Gait velocity interpretation: <1.31 ft/sec, indicative of household ambulator   General Gait Details: pt maintained RW in front of her with trunk flexion despite cues. modA to manage RW around obstacles with  pt running hands into countertop multiple times       Balance Overall balance assessment: Needs assistance Sitting-balance support: Single extremity  supported Sitting balance-Leahy Scale: Fair Sitting balance - Comments: BUE support at EOB and min-modA due to R lateral lean Postural control: Posterior lean, Right lateral lean Standing balance support: Single extremity supported, Reliant on assistive device for balance, Bilateral upper extremity supported Standing balance-Leahy Scale: Poor Standing balance comment: dependent on at least single UE support and minA, increased assist for navigaing and managing RW                            Communication Communication Communication: Impaired Factors Affecting Communication: Hearing impaired  Cognition Arousal: Alert Behavior During Therapy: WFL for tasks assessed/performed   PT - Cognitive impairments: Awareness, Memory, Attention, Initiation, Problem solving, Safety/Judgement                       PT - Cognition Comments: pt oriented but with significant deficits in memory, awareness, and problem solving. Pt stating she needed to use bathroom but did not stop brushing her teeth despite repeated cues until the pt was incontinent in standing. Following commands: Impaired Following commands impaired: Only follows one step commands consistently, Follows multi-step commands inconsistently    Cueing Cueing Techniques: Verbal cues  Exercises      General Comments General comments (skin integrity, edema, etc.): HR to 123bpm with gait, BP dropping but MAP in 70s      Pertinent Vitals/Pain Pain Assessment Pain Assessment: Faces Faces Pain Scale: No hurt Pain Intervention(s): Monitored during session    Home Living Family/patient expects to be discharged to:: (P) Private residence Living Arrangements: (P) Alone (daughter wants her to move in with her) Available Help at Discharge: (P) Family;Available PRN/intermittently Type of Home: (P) House Home Access: (P) Stairs to enter Entrance Stairs-Rails: (P) None Entrance Stairs-Number of Steps: (P) 2   Home Layout:  (P) One level Home Equipment: (P) Cane - single point;Shower seat Additional Comments: (P) can go stay with daughter    Prior Function            PT Goals (current goals can now be found in the care plan section) Acute Rehab PT Goals Patient Stated Goal: to get some sleep PT Goal Formulation: With patient Time For Goal Achievement: 06/20/24 Potential to Achieve Goals: Good Progress towards PT goals: Progressing toward goals    Frequency    Min 2X/week           Co-evaluation   Reason for Co-Treatment: Necessary to address cognition/behavior during functional activity;For patient/therapist safety;To address functional/ADL transfers PT goals addressed during session: Mobility/safety with mobility;Balance;Proper use of DME        AM-PAC PT 6 Clicks Mobility   Outcome Measure  Help needed turning from your back to your side while in a flat bed without using bedrails?: A Little Help needed moving from lying on your back to sitting on the side of a flat bed without using bedrails?: A Lot Help needed moving to and from a bed to a chair (including a wheelchair)?: A Lot Help needed standing up from a chair using your arms (e.g., wheelchair or bedside chair)?: A Lot Help needed to walk in hospital room?: Total Help needed climbing 3-5 steps with a railing? : Total 6 Click Score: 11    End of Session Equipment Utilized During Treatment: Gait belt Activity Tolerance: Patient  tolerated treatment well Patient left: in bed;with call bell/phone within reach;with bed alarm set Nurse Communication: Mobility status;Other (comment) (vitals and cognition) PT Visit Diagnosis: Unsteadiness on feet (R26.81);Other abnormalities of gait and mobility (R26.89);Muscle weakness (generalized) (M62.81)     Time: 8659-8580 PT Time Calculation (min) (ACUTE ONLY): 39 min  Charges:    $Therapeutic Exercise: 8-22 mins $Therapeutic Activity: 8-22 mins PT General Charges $$ ACUTE PT VISIT: 1  Visit                     Izetta Call, PT, DPT   Acute Rehabilitation Department Office [phone] Secure Chat Communication Preferred   Izetta JULIANNA Call 06/09/2024, 3:28 PM

## 2024-06-09 NOTE — Progress Notes (Signed)
 [facility] night cross cover note:   I was notified by the patient's RN that the patient is complaining of burning epigastric discomfort over the last 4 days, which is refractory to prn doses of Maalox/Mylanta.  It is noted that she is also already on tonics 40 mg p.o. daily.  I subsequently ordered IV Pepcid  x 1 dose now as well as a one-time dose of p.o. Bentyl .    Eva Pore, DO Hospitalist

## 2024-06-09 NOTE — Plan of Care (Signed)
  Problem: Education: Goal: Knowledge of General Education information will improve Description: Including pain rating scale, medication(s)/side effects and non-pharmacologic comfort measures Outcome: Progressing   Problem: Elimination: Goal: Will not experience complications related to bowel motility Outcome: Progressing Goal: Will not experience complications related to urinary retention Outcome: Progressing   Problem: Pain Managment: Goal: General experience of comfort will improve and/or be controlled Outcome: Progressing   Problem: Pain Managment: Goal: General experience of comfort will improve and/or be controlled Outcome: Progressing   Problem: Safety: Goal: Ability to remain free from injury will improve Outcome: Progressing

## 2024-06-09 NOTE — Progress Notes (Signed)
 PROGRESS NOTE    Dierdre Mccalip  FMW:[id number] DOB: [birth year] DOA: 06/03/2024 PCP: Ileen Rosaline NOVAK, NP     Brief Narrative:  Kristine [name] is a 83 y.o. female with medical history significant of CREST syndrome, CAD, STEMI s/p DES to OM1 in 2014, HLD, HTN, ischemic colitis, Barrett's esophagus, OSA on CPAP, and OA presented with shortness of breath. Had episode of syncope 7/13.  She reports that she was at the bedside commode, urinated, then developed flushing.  She became diaphoretic, lightheaded, had bradycardia down to heart rate of 29 before losing consciousness.  She recalls being back in bed and is able to relate the events that happened. Upon returning back to bed, her blood pressure and heart rate returned back to normal.  Blood sugar at this time was normal 141.Patient underwent heart cath 7/14.     New events last 24 hours / Subjective: Reports feeling much better today, denies any new complaints  Assessment & Plan:   Principal Problem:   NSTEMI (non-ST elevated myocardial infarction) (HCC) Active Problems:   Hypertension   CAD (coronary artery disease), native coronary artery   Hyperlipidemia   OSA on CPAP   Cr(e)st syndrome (HCC)   Acute respiratory failure with hypoxia (HCC)   COPD exacerbation (HCC)   Dyspnea   Syncope, vasovagal   Acute on chronic diastolic CHF (congestive heart failure) (HCC)    NSTEMI History of CAD status post stent placement  Troponin 718, 629, 546 Cardiology signed off 7/14  Echocardiogram showed EF 50-50 left-ventricle demonstrates regional wall motion abnl Heart cath 7/14 showed non-obstructive disease  Aspirin , pravastatin , metoprolol   D dimer elevated, CTA chest to ruled out PE   Vasovagal syncope Currently holding metoprolol  and Lasix , given 500cc IVF 7/13 Orthostatic vital sign showed tachycardia with standing. Resume metoprolol   S/p IVF Monitor   Acute hypoxemic respiratory failure ??  Possibility of aspiration Started on  IV Solu-Medrol  --> prednisone , Breo, duoneb  Discontinued prednisone  was no significant indication SLP evaluation  Leukocytosis In setting of steroid use.  Continue to monitor  Acute on chronic diastolic heart failure Currently holding Lasix  given her syncopal episode  Achalasia CT showed stable dilated esophagus with air-fluid level GI consulted 7/12: they are not planning any further workup in the hospital.  She does have an outpatient GI doctor follow-up in August  CREST syndrome Outpatient rheumatology appointment in August  Microcytic anemia/iron  def anemia Hemoglobin baseline around 10, now down to about 8 Iron  panel pending Repeat CBC  OSA CPAP nightly  Hyperlipidemia Pravastatin   Stable 2 mm lung nodules Outpatient follow-up  Hypokalemia Replace prn    DVT prophylaxis: Lovenox   Code Status: Full code Family Communication: Discussed with daughter over the phone Disposition Plan: SNF Status is: Inpatient Remains inpatient appropriate because: Level of care   Antimicrobials:  Anti-infectives (From admission, onward)    Start     Dose/Rate Route Frequency Ordered Stop   06/05/24 0800  cefTRIAXone  (ROCEPHIN ) 2 g in sodium chloride  0.9 % 100 mL IVPB  Status:  Discontinued        2 g 200 mL/hr over 30 Minutes Intravenous Every 24 hours 06/04/24 0756 06/04/24 1430        Objective: Vitals:   06/09/24 0509 06/09/24 0700 06/09/24 0900 06/09/24 1400  BP: 131/63  122/65 101/60  Pulse: 76 86 96 (!) 106  Resp: 18     Temp: 98.2 F (36.8 C)  98.7 F (37.1 C)   TempSrc: Oral  Oral  SpO2: 99% 97% 93% 98%  Weight:      Height:        Intake/Output Summary (Last 24 hours) at 06/09/2024 1845 Last data filed at 06/09/2024 1000 Gross per 24 hour  Intake 240 ml  Output --  Net 240 ml   Filed Weights   06/04/24 1609 06/07/24 0500  Weight: 48.8 kg 47.5 kg    Examination:  General: NAD  Cardiovascular: S1, S2 present Respiratory: CTAB Abdomen: Soft,  nontender, nondistended, bowel sounds present Musculoskeletal: No bilateral pedal edema noted Skin: Normal Psychiatry: Normal mood    Data Reviewed: I have personally reviewed following labs and imaging studies  CBC: Recent Labs  Lab 06/04/24 0755 06/06/24 0209 06/07/24 0513 06/08/24 0453 06/09/24 0401  WBC 11.9* 14.7* 17.2* 19.1* 18.2*  HGB 10.6* 10.2* 9.2* 8.8* 8.3*  HCT 36.2 34.3* 31.3* 30.5* 28.7*  MCV 75.9* 74.9* 75.6* 76.8* 76.5*  PLT 217 240 228 228 235   Basic Metabolic Panel: Recent Labs  Lab 06/05/24 0430 06/06/24 2143 06/07/24 0513 06/08/24 0453 06/09/24 0401  NA 137 139 140 142 136  K 4.1 4.0 3.2* 3.3* 4.1  CL 104 99 99 104 102  CO2 25 31 32 28 26  GLUCOSE 120* 133* 140* 156* 128*  BUN 15 28* 26* 23 18  CREATININE 0.71 0.66 0.62 0.64 0.57  CALCIUM  9.2 9.4 9.3 9.2 8.5*  MG 2.1 2.2  --  2.1  --    GFR: Estimated Creatinine Clearance: 39.6 mL/min (by C-G formula based on SCr of 0.57 mg/dL). Liver Function Tests: Recent Labs  Lab 06/04/24 0007  AST 20  ALT 12  ALKPHOS 87  BILITOT <0.2  PROT 6.7  ALBUMIN 4.3   Recent Labs  Lab 06/04/24 0007  LIPASE 54*   No results for input(s): AMMONIA in the last 168 hours. Coagulation Profile: No results for input(s): INR, PROTIME in the last 168 hours. Cardiac Enzymes: No results for input(s): CKTOTAL, CKMB, CKMBINDEX, TROPONINI in the last 168 hours. BNP (last 3 results) No results for input(s): PROBNP in the last 8760 hours. HbA1C: No results for input(s): HGBA1C in the last 72 hours. CBG: Recent Labs  Lab 06/04/24 0130 06/06/24 0624  GLUCAP 165* 141*   Lipid Profile: Recent Labs    06/07/24 0513  CHOL 161  HDL 47  LDLCALC 87  TRIG 136  CHOLHDL 3.4   Thyroid  Function Tests: No results for input(s): TSH, T4TOTAL, FREET4, T3FREE, THYROIDAB in the last 72 hours. Anemia Panel: No results for input(s): VITAMINB12, FOLATE, FERRITIN, TIBC, IRON ,  RETICCTPCT in the last 72 hours. Sepsis Labs: No results for input(s): PROCALCITON, LATICACIDVEN in the last 168 hours.  Recent Results (from the past 240 hours)  Respiratory (~20 pathogens) panel by PCR     Status: None   Collection Time: 06/04/24  7:57 AM   Specimen: Nasopharyngeal Swab; Respiratory  Result Value Ref Range Status   Adenovirus NOT DETECTED NOT DETECTED Final   Coronavirus 229E NOT DETECTED NOT DETECTED Final    Comment: (NOTE) The Coronavirus on the Respiratory Panel, DOES NOT test for the novel  Coronavirus (2019 nCoV)    Coronavirus HKU1 NOT DETECTED NOT DETECTED Final   Coronavirus NL63 NOT DETECTED NOT DETECTED Final   Coronavirus OC43 NOT DETECTED NOT DETECTED Final   Metapneumovirus NOT DETECTED NOT DETECTED Final   Rhinovirus / Enterovirus NOT DETECTED NOT DETECTED Final   Influenza A NOT DETECTED NOT DETECTED Final   Influenza B NOT DETECTED NOT DETECTED  Final   Parainfluenza Virus 1 NOT DETECTED NOT DETECTED Final   Parainfluenza Virus 2 NOT DETECTED NOT DETECTED Final   Parainfluenza Virus 3 NOT DETECTED NOT DETECTED Final   Parainfluenza Virus 4 NOT DETECTED NOT DETECTED Final   Respiratory Syncytial Virus NOT DETECTED NOT DETECTED Final   Bordetella pertussis NOT DETECTED NOT DETECTED Final   Bordetella Parapertussis NOT DETECTED NOT DETECTED Final   Chlamydophila pneumoniae NOT DETECTED NOT DETECTED Final   Mycoplasma pneumoniae NOT DETECTED NOT DETECTED Final    Comment: Performed at [facility], [address] [place], [place] [address]      Radiology Studies: CT Angio Chest Pulmonary Embolism (PE) W or WO Contrast Result Date: 06/08/2024 CLINICAL DATA:  Positive D-dimer. EXAM: CT ANGIOGRAPHY CHEST WITH CONTRAST TECHNIQUE: Multidetector CT imaging of the chest was performed using the standard protocol during bolus administration of intravenous contrast. Multiplanar CT image reconstructions and MIPs were obtained to evaluate the  vascular anatomy. RADIATION DOSE REDUCTION: This exam was performed according to the departmental dose-optimization program which includes automated exposure control, adjustment of the mA and/or kV according to patient size and/or use of iterative reconstruction technique. CONTRAST:  75mL OMNIPAQUE  IOHEXOL  350 MG/ML SOLN COMPARISON:  May 05, 2024. FINDINGS: Cardiovascular: Satisfactory opacification of the pulmonary arteries to the segmental level. No evidence of pulmonary embolism. Normal heart size. No pericardial effusion. Mediastinum/Nodes: Stable severely dilated and fluid-filled esophagus is noted with associated hiatal hernia. No definite abnormality. Thyroid  gland is unremarkable. Lungs/Pleura: No pneumothorax is noted. Small bilateral pleural effusions are noted with minimal adjacent subsegmental atelectasis. Upper Abdomen: No acute abnormality. Musculoskeletal: No chest wall abnormality. No acute or significant osseous findings. Review of the MIP images confirms the above findings. IMPRESSION: No definite evidence of pulmonary embolus. Small bilateral pleural effusions with minimal adjacent subsegmental atelectasis. Stable severely dilated and fluid-filled esophagus is noted with associated hiatal hernia. Aortic Atherosclerosis (ICD10-I70.0). Electronically Signed   By: Lynwood Landy Raddle M.D.   On: 06/08/2024 15:37      Scheduled Meds:  aspirin  EC  81 mg Oral Daily   enoxaparin  (LOVENOX ) injection  40 mg Subcutaneous Q24H   feeding supplement  237 mL Oral BID BM   fluticasone  furoate-vilanterol  1 puff Inhalation Daily   metoprolol  succinate  12.5 mg Oral Daily   pantoprazole   40 mg Oral Daily   pravastatin   40 mg Oral QPM   predniSONE   40 mg Oral Q breakfast   sodium chloride  flush  3 mL Intravenous Q12H   Continuous Infusions:     LOS: 5 days     Lebron JINNY Cage, MD [facility] 06/09/2024, 6:45 PM   Available via Epic secure chat 7am-7pm After these hours, please  refer to coverage provider listed on [email or web address]

## 2024-06-09 NOTE — TOC Progression Note (Addendum)
 Transition of Care [facility]) - Progression Note    Patient Details  Name: Kristine [name] MRN: [id number] Date of Birth: [birth year]  [facility] CM/SW Contact  Isaiah Public, LCSWA Phone Number: 06/09/2024, 10:42 AM  Clinical Narrative:     CSW spoke with patient and patients daughter Powell at bedside and provided medicare compare ratings list with patients SNF bed offers. Patients daughter is going to go view [facility] around 2:15 before making final decision between [facility] and [facility]. After viewing [facility] patients daughter plans on giving CSW a call back with final SNF choice for patient. Patient in agreement with plan.CSW will continue to follow and assist with patients dc planning needs.  Update- CSW received call back from [facility] patients daughter who informed CSW she has decided not to go view [facility]. Patient has decided to accept SNF bed offer with [facility]. CSW awaiting to hear back from [facility] with [facility] to confirm SNF bed for patient.  Update- Nicki with [facility] confirmed SNF bed for patient. CSW requested Angela CMA to start insurance authorization for patient.  Update- Jon CMA informed CSW she started insurance authorization for patient. Navi jluyPI#[id number] .  Update- Patients insurance authorization has been approved. Auth ID# [id number], 7/16-7/18. CSW informed MD.   Expected Discharge Plan: Skilled Nursing Facility Barriers to Discharge: Continued Medical Work up  Expected Discharge Plan and Services In-house Referral: Clinical Social Work     Living arrangements for the past 2 months: Single Family Home                                       Social Determinants of Health (SDOH) Interventions SDOH Screenings   Food Insecurity: No Food Insecurity (06/04/2024)  Housing: Low Risk  (06/04/2024)  Transportation Needs: No Transportation Needs (06/04/2024)  Utilities: Not At Risk (06/04/2024)  Depression (PHQ2-9): Low Risk  (07/24/2021)   Recent Concern: Depression (PHQ2-9) - Medium Risk (05/25/2021)  Social Connections: Moderately Integrated (06/04/2024)  Stress: Stress Concern Present (03/26/2024)   Received from [facility]  Tobacco Use: Low Risk  (06/03/2024)    Readmission Risk Interventions     No data to display

## 2024-06-09 NOTE — Evaluation (Signed)
 Occupational Therapy Evaluation Patient Details Name: Kristine [name] MRN: [id number] DOB: 06/25/1941 Today's Date: 06/09/2024   History of Present Illness   Pt is 83 yo presenting to [facility] via transfer from [facility] on 7/10 due to shortness of breath. Work up revealed NSTEMI, and acute respiratory failure with hypoxia due to COPD exacerbation. S/p LHC on 7/14. PMH: CREST syndrome, CAD, STEMI s/p DES to OM1 in 2014, HLD, HTN, ischemic colitis, Barrett's esophagus, OSA on CPAP, and OA.     Clinical Impressions PTA, pt lived alone and reports being independent in all ADL and IADL. Upon eval, pt presents with generalized weakness, decreased activity tolerance, decreased balance, safety, awareness, and reasoning. Pt needing up to min A for BADL and mod A for transfers esp pivotal steps with RW with poor safety awareness. Pt standing at sink initiating oral care and then stating she needs to use restroom first; after bring provided permission, continues to initiate oral care and with incontinence episode. Pt additionally with MAP dropping to 64 with Standing. Will continue to follow. Patient will benefit from continued inpatient follow up therapy, <3 hours/day    If plan is discharge home, recommend the following:   A little help with walking and/or transfers;A little help with bathing/dressing/bathroom;A lot of help with walking and/or transfers;Assistance with cooking/housework;Assist for transportation;Help with stairs or ramp for entrance;Direct supervision/assist for medications management;Direct supervision/assist for financial management     Functional Status Assessment   Patient has had a recent decline in their functional status and demonstrates the ability to make significant improvements in function in a reasonable and predictable amount of time.     Equipment Recommendations   Other (comment) (defer)     Recommendations for Other Services          Precautions/Restrictions   Precautions Precautions: Fall;Other (comment) Recall of Precautions/Restrictions: Intact Precaution/Restrictions Comments: watch BP and HR Restrictions Weight Bearing Restrictions Per Provider Order: No     Mobility Bed Mobility Overal bed mobility: Needs Assistance Bed Mobility: Supine to Sit, Sit to Supine     Supine to sit: Min assist, Used rails, +2 for physical assistance, HOB elevated Sit to supine: Min assist, Used rails, HOB elevated   General bed mobility comments: minA to manage trunk, pt with continued R lateral lean in sitting and did not complete scoot to EOB despite cues. minA to LE to return to bed    Transfers Overall transfer level: Needs assistance Equipment used: Rolling walker (2 wheels) Transfers: Sit to/from Stand, Bed to chair/wheelchair/BSC Sit to Stand: Min assist     Step pivot transfers: Mod assist     General transfer comment: minA to rise, minA to steady in stance. dependent on at least single UE support. modA to manage RW and cues for stepping with turns      Balance Overall balance assessment: Needs assistance Sitting-balance support: Single extremity supported Sitting balance-Leahy Scale: Fair Sitting balance - Comments: BUE support at EOB and min-modA due to R lateral lean Postural control: Posterior lean, Right lateral lean Standing balance support: Single extremity supported, Reliant on assistive device for balance, Bilateral upper extremity supported Standing balance-Leahy Scale: Poor Standing balance comment: dependent on at least single UE support and minA, increased assist for navigaing and managing RW                           ADL either performed or assessed with clinical judgement   ADL Overall  ADL's : Needs assistance/impaired Eating/Feeding: Set up;Bed level   Grooming: Minimal assistance;Standing   Upper Body Bathing: Minimal assistance;Sitting   Lower Body Bathing: Minimal  assistance;Sitting/lateral leans   Upper Body Dressing : Minimal assistance;Sitting   Lower Body Dressing: Minimal assistance;Sit to/from stand   Toilet Transfer: Minimal assistance;Ambulation;Rolling walker (2 wheels);BSC/3in1   Toileting- Clothing Manipulation and Hygiene: Contact guard assist;Sitting/lateral lean Toileting - Clothing Manipulation Details (indicate cue type and reason): on BSC     Functional mobility during ADLs: Minimal assistance;Cueing for sequencing;+2 for safety/equipment       Vision   Additional Comments: not formally assessed     Perception         Praxis         Pertinent Vitals/Pain Pain Assessment Pain Assessment: Faces Faces Pain Scale: No hurt Pain Intervention(s): Limited activity within patient's tolerance, Monitored during session     Extremity/Trunk Assessment Upper Extremity Assessment Upper Extremity Assessment: Generalized weakness   Lower Extremity Assessment Lower Extremity Assessment: Defer to PT evaluation       Communication Communication Communication: Impaired Factors Affecting Communication: Hearing impaired   Cognition Arousal: Alert Behavior During Therapy: WFL for tasks assessed/performed Cognition: No family/caregiver present to determine baseline, Cognition impaired     Awareness: Intellectual awareness impaired, Online awareness impaired (increaed time and external cues to recognize balance deficits seated EOB)   Attention impairment (select first level of impairment): Sustained attention, Selective attention Executive functioning impairment (select all impairments): Organization, Sequencing, Reasoning, Problem solving OT - Cognition Comments: pt oriented, follows basic commands, relates all physical deficits to feeling tired as well as cognitive ones. At sink, pt states I need to use the restroom and provided permission to go, but then continues to brush teeth and urinates standing brushing teeth. Pt  states, yes, I knew it was okay to go to restroom, but I was brushing my teeth when  asked what happened                 Following commands: Impaired Following commands impaired: Only follows one step commands consistently, Follows multi-step commands inconsistently     Cueing  General Comments   Cueing Techniques: Verbal cues  HR to 123bpm with gait, BP dropping but MAP in 70s   Exercises     Shoulder Instructions      Home Living Family/patient expects to be discharged to:: Private residence Living Arrangements: Alone (daughter wants her to move in with her) Available Help at Discharge: Family;Available PRN/intermittently Type of Home: House Home Access: Stairs to enter [employer] of Steps: 2 Entrance Stairs-Rails: None Home Layout: One level     Bathroom Shower/Tub: Chief Strategy Officer: Handicapped height Bathroom Accessibility: No   Home Equipment: Cane - single point;Shower seat   Additional Comments: can go stay with daughter      Prior Functioning/Environment Prior Level of Function : Independent/Modified Independent             Mobility Comments: Pt uses SPC to ambulate ADLs Comments: pt sits to shower, manages finances and medications.    OT Problem List: Decreased strength;Decreased activity tolerance;Impaired balance (sitting and/or standing);Decreased cognition;Decreased safety awareness;Cardiopulmonary status limiting activity   OT Treatment/Interventions: Self-care/ADL training;Therapeutic exercise;DME and/or AE instruction;Therapeutic activities;Patient/family education;Cognitive remediation/compensation;Balance training      OT Goals(Current goals can be found in the care plan section)   Acute Rehab OT Goals Patient Stated Goal: get better OT Goal Formulation: With patient Time For Goal Achievement: 06/23/24 Potential to  Achieve Goals: Good   OT Frequency:  Min 2X/week    Co-evaluation   Reason for  Co-Treatment: Necessary to address cognition/behavior during functional activity;For patient/therapist safety;To address functional/ADL transfers PT goals addressed during session: Mobility/safety with mobility;Balance;Proper use of DME        AM-PAC OT 6 Clicks Daily Activity     Outcome Measure Help from another person eating meals?: A Little Help from another person taking care of personal grooming?: A Little Help from another person toileting, which includes using toliet, bedpan, or urinal?: A Little Help from another person bathing (including washing, rinsing, drying)?: A Little Help from another person to put on and taking off regular upper body clothing?: A Little Help from another person to put on and taking off regular lower body clothing?: A Little 6 Click Score: 18   End of Session Equipment Utilized During Treatment: Gait belt;Rolling walker (2 wheels) Nurse Communication: Mobility status  Activity Tolerance: Patient tolerated treatment well Patient left: in bed;with call bell/phone within reach;with bed alarm set  OT Visit Diagnosis: Unsteadiness on feet (R26.81);Muscle weakness (generalized) (M62.81);Other symptoms and signs involving cognitive function                Time: 1340-1417 OT Time Calculation (min): 37 min Charges:  OT General Charges $OT Visit: 1 Visit OT Evaluation $OT Eval Moderate Complexity: 1 Mod  Elma JONETTA Lebron FREDERICK, OTR/L [facility] Acute Rehabilitation Office: [phone]   Elma JONETTA Lebron 06/09/2024, 4:32 PM

## 2024-06-10 DIAGNOSIS — I214 Non-ST elevation (NSTEMI) myocardial infarction: Secondary | ICD-10-CM | POA: Diagnosis not present

## 2024-06-10 LAB — CBC WITH DIFFERENTIAL/PLATELET
Abs Immature Granulocytes: 0.1 K/uL — ABNORMAL HIGH (ref 0.00–0.07)
Basophils Absolute: 0 K/uL (ref 0.0–0.1)
Basophils Relative: 0 %
Eosinophils Absolute: 0 K/uL (ref 0.0–0.5)
Eosinophils Relative: 0 %
HCT: 23.1 % — ABNORMAL LOW (ref 36.0–46.0)
Hemoglobin: 6.9 g/dL — CL (ref 12.0–15.0)
Immature Granulocytes: 1 %
Lymphocytes Relative: 20 %
Lymphs Abs: 2.2 K/uL (ref 0.7–4.0)
MCH: 22.8 pg — ABNORMAL LOW (ref 26.0–34.0)
MCHC: 29.9 g/dL — ABNORMAL LOW (ref 30.0–36.0)
MCV: 76.5 fL — ABNORMAL LOW (ref 80.0–100.0)
Monocytes Absolute: 1.4 K/uL — ABNORMAL HIGH (ref 0.1–1.0)
Monocytes Relative: 13 %
Neutro Abs: 7.2 K/uL (ref 1.7–7.7)
Neutrophils Relative %: 66 %
Platelets: 189 K/uL (ref 150–400)
RBC: 3.02 MIL/uL — ABNORMAL LOW (ref 3.87–5.11)
RDW: 19.8 % — ABNORMAL HIGH (ref 11.5–15.5)
WBC: 10.9 K/uL — ABNORMAL HIGH (ref 4.0–10.5)
nRBC: 0 % (ref 0.0–0.2)

## 2024-06-10 LAB — BASIC METABOLIC PANEL WITH GFR
Anion gap: 6 (ref 5–15)
BUN: 17 mg/dL (ref 8–23)
CO2: 24 mmol/L (ref 22–32)
Calcium: 8.5 mg/dL — ABNORMAL LOW (ref 8.9–10.3)
Chloride: 104 mmol/L (ref 98–111)
Creatinine, Ser: 0.53 mg/dL (ref 0.44–1.00)
GFR, Estimated: >60 mL/min (ref >60)
Glucose, Bld: 110 mg/dL — ABNORMAL HIGH (ref 70–99)
Potassium: 4 mmol/L (ref 3.5–5.1)
Sodium: 134 mmol/L — ABNORMAL LOW (ref 135–145)

## 2024-06-10 LAB — PREPARE RBC (CROSSMATCH)

## 2024-06-10 LAB — HEMOGLOBIN AND HEMATOCRIT, BLOOD
HCT: 31.6 % — ABNORMAL LOW (ref 36.0–46.0)
Hemoglobin: 10 g/dL — ABNORMAL LOW (ref 12.0–15.0)

## 2024-06-10 LAB — IRON AND TIBC
Iron: 15 ug/dL — ABNORMAL LOW (ref 28–170)
Saturation Ratios: 5 % — ABNORMAL LOW (ref 10.4–31.8)
TIBC: 325 ug/dL (ref 250–450)
UIBC: 310 ug/dL

## 2024-06-10 LAB — OCCULT BLOOD X 1 CARD TO LAB, STOOL: Fecal Occult Bld: NEGATIVE

## 2024-06-10 LAB — RETICULOCYTES
Immature Retic Fract: 34.5 % — ABNORMAL HIGH (ref 2.3–15.9)
RBC.: 3.03 MIL/uL — ABNORMAL LOW (ref 3.87–5.11)
Retic Count, Absolute: 88.2 K/uL (ref 19.0–186.0)
Retic Ct Pct: 2.9 % (ref 0.4–3.1)

## 2024-06-10 LAB — FOLATE
Folate: 24.5 ng/mL (ref >5.9)
Folate: 32.6 ng/mL (ref >5.9)

## 2024-06-10 LAB — VITAMIN B12: Vitamin B-12: 563 pg/mL (ref 180–914)

## 2024-06-10 LAB — FERRITIN: Ferritin: 6 ng/mL — ABNORMAL LOW (ref 11–307)

## 2024-06-10 MED ORDER — SENNOSIDES-DOCUSATE SODIUM 8.6-50 MG PO TABS
1.0000 | ORAL_TABLET | Freq: Two times a day (BID) | ORAL | Status: DC
  Administered 2024-06-10 – 2024-06-11 (×3): 1 via ORAL
  Filled 2024-06-10 (×3): qty 1

## 2024-06-10 MED ORDER — PANTOPRAZOLE SODIUM 40 MG IV SOLR
40.0000 mg | Freq: Two times a day (BID) | INTRAVENOUS | Status: DC
  Administered 2024-06-10 – 2024-06-11 (×3): 40 mg via INTRAVENOUS
  Filled 2024-06-10 (×3): qty 10

## 2024-06-10 MED ORDER — SODIUM CHLORIDE 0.9% IV SOLUTION: Freq: Once | INTRAVENOUS | Status: AC

## 2024-06-10 MED ORDER — IRON SUCROSE 200 MG IVPB - SIMPLE MED
200.0000 mg | Freq: Once | Status: AC
  Administered 2024-06-10: 200 mg via INTRAVENOUS
  Filled 2024-06-10: qty 200

## 2024-06-10 MED ORDER — POLYETHYLENE GLYCOL 3350 17 G PO PACK: 17.0000 g | PACK | Freq: Every day | ORAL | Status: DC | PRN

## 2024-06-10 MED ORDER — POLYETHYLENE GLYCOL 3350 17 G PO PACK: 17.0000 g | PACK | Freq: Two times a day (BID) | ORAL | Status: DC

## 2024-06-10 MED ORDER — FERROUS SULFATE 325 (65 FE) MG PO TABS
325.0000 mg | ORAL_TABLET | Freq: Every day | ORAL | Status: DC
  Administered 2024-06-10 – 2024-06-11 (×2): 325 mg via ORAL
  Filled 2024-06-10 (×2): qty 1

## 2024-06-10 NOTE — Progress Notes (Signed)
 Pt notified that Hemoglobin is low at 6.9, and MD has ordered I unit of blood. Per pt, she cannot get regular blood because she has antibody K. Spoke with Dana at the blood bank, and she was able to find information in pt history.  Per Lonell it will take longer to get blood ready. [place], RN made aware.

## 2024-06-10 NOTE — Plan of Care (Signed)
  Problem: Education: Goal: Knowledge of General Education information will improve Description: Including pain rating scale, medication(s)/side effects and non-pharmacologic comfort measures Outcome: Progressing   Problem: Health Behavior/Discharge Planning: Goal: Ability to manage health-related needs will improve Outcome: Progressing   Problem: Clinical Measurements: Goal: Ability to maintain clinical measurements within normal limits will improve Outcome: Progressing Goal: Will remain free from infection Outcome: Progressing Goal: Diagnostic test results will improve Outcome: Progressing Goal: Respiratory complications will improve Outcome: Progressing Goal: Cardiovascular complication will be avoided Outcome: Progressing   Problem: Activity: Goal: Risk for activity intolerance will decrease Outcome: Progressing   Problem: Nutrition: Goal: Adequate nutrition will be maintained Outcome: Progressing   Problem: Coping: Goal: Level of anxiety will decrease Outcome: Progressing   Problem: Elimination: Goal: Will not experience complications related to bowel motility Outcome: Progressing   Problem: Pain Managment: Goal: General experience of comfort will improve and/or be controlled Outcome: Progressing   Problem: Safety: Goal: Ability to remain free from injury will improve Outcome: Progressing   Problem: Skin Integrity: Goal: Risk for impaired skin integrity will decrease Outcome: Progressing   Problem: Education: Goal: Understanding of CV disease, CV risk reduction, and recovery process will improve Outcome: Progressing Goal: Individualized Educational Video(s) Outcome: Progressing   Problem: Activity: Goal: Ability to return to baseline activity level will improve Outcome: Progressing   Problem: Cardiovascular: Goal: Ability to achieve and maintain adequate cardiovascular perfusion will improve Outcome: Progressing Goal: Vascular access site(s) Level  0-1 will be maintained Outcome: Progressing   Problem: Health Behavior/Discharge Planning: Goal: Ability to safely manage health-related needs after discharge will improve Outcome: Progressing

## 2024-06-10 NOTE — Progress Notes (Signed)
 PROGRESS NOTE    Kristine [name]  FMW:[id number] DOB: [birth year] DOA: 06/03/2024 PCP: Ileen Rosaline NOVAK, NP     Brief Narrative:  Kristine [name] is a 83 y.o. female with medical history significant of CREST syndrome, CAD, STEMI s/p DES to OM1 in 2014, HLD, HTN, ischemic colitis, Barrett's esophagus, OSA on CPAP, and OA presented with shortness of breath. Had episode of syncope 7/13.  She reports that she was at the bedside commode, urinated, then developed flushing.  She became diaphoretic, lightheaded, had bradycardia down to heart rate of 29 before losing consciousness.  She recalls being back in bed and is able to relate the events that happened. Upon returning back to bed, her blood pressure and heart rate returned back to normal.  Blood sugar at this time was normal 141.Patient underwent heart cath 7/14.     New events last 24 hours / Subjective: Patient noted to have drop in hemoglobin to 6.9.  Denies any new complaints.  No obvious bleeding noted.   Assessment & Plan:   Principal Problem:   NSTEMI (non-ST elevated myocardial infarction) (HCC) Active Problems:   Hypertension   CAD (coronary artery disease), native coronary artery   Hyperlipidemia   OSA on CPAP   Cr(e)st syndrome (HCC)   Acute respiratory failure with hypoxia (HCC)   COPD exacerbation (HCC)   Dyspnea   Syncope, vasovagal   Acute on chronic diastolic CHF (congestive heart failure) (HCC)    NSTEMI History of CAD status post stent placement  Troponin 718, 629, 546 Cardiology signed off 7/14  Echocardiogram showed EF 50-50 left-ventricle demonstrates regional wall motion abnl Heart cath 7/14 showed non-obstructive disease  Held aspirin , metoprolol , continue pravastatin  D dimer elevated, CTA chest to ruled out PE   Vasovagal syncope Currently holding metoprolol  and Lasix , given 500cc IVF 7/13 Orthostatic vital sign showed tachycardia with standing Holding metoprolol  S/p IVF Monitor   Acute hypoxemic  respiratory failure ??  Possibility of aspiration Started on IV Solu-Medrol  --> prednisone , Breo, duoneb  Discontinued prednisone  was no significant indication SLP evaluation  Leukocytosis In setting of steroid use.  Continue to monitor  Acute on chronic diastolic heart failure Currently holding Lasix  given her syncopal episode and drop in hemoglobin  Achalasia CT showed stable dilated esophagus with air-fluid level GI consulted, they are not planning any further workup in the hospital.  She does have an outpatient GI doctor follow-up in August  CREST syndrome Outpatient rheumatology appointment in August  Microcytic anemia/iron  def anemia Hemoglobin baseline around 10, dropped to 6.9 Iron  panel showed iron  15, sats 5, ferritin 6 Start iron  infusion, give 1 unit of PRBC, has antibody K so may take longer to receive her blood Trend CBC  OSA CPAP nightly  Hyperlipidemia Pravastatin   Stable 2 mm lung nodules Outpatient follow-up  Hypokalemia Replace prn    DVT prophylaxis: Lovenox   Code Status: Full code Family Communication: Discussed with daughter over the phone Disposition Plan: SNF Status is: Inpatient Remains inpatient appropriate because: Level of care   Antimicrobials:  Anti-infectives (From admission, onward)    Start     Dose/Rate Route Frequency Ordered Stop   06/05/24 0800  cefTRIAXone  (ROCEPHIN ) 2 g in sodium chloride  0.9 % 100 mL IVPB  Status:  Discontinued        2 g 200 mL/hr over 30 Minutes Intravenous Every 24 hours 06/04/24 0756 06/04/24 1430        Objective: Vitals:   06/10/24 1159 06/10/24 1352 06/10/24 1438 06/10/24 1630  BP: (!) 110/51 (!) 131/53 (!) 114/58 (!) 120/58  Pulse: 91 95 (!) 108 87  Resp: 18 18 16 16   Temp: 98 F (36.7 C) 99.3 F (37.4 C) 98.1 F (36.7 C) 98.5 F (36.9 C)  TempSrc: Oral Oral Oral Oral  SpO2: 95% 96% 95% 92%  Weight:      Height:        Intake/Output Summary (Last 24 hours) at 06/10/2024  1716 Last data filed at 06/10/2024 1630 Gross per 24 hour  Intake 740 ml  Output 1150 ml  Net -410 ml   Filed Weights   06/04/24 1609 06/07/24 0500  Weight: 48.8 kg 47.5 kg    Examination:  General: NAD  Cardiovascular: S1, S2 present Respiratory: CTAB Abdomen: Soft, nontender, nondistended, bowel sounds present Musculoskeletal: No bilateral pedal edema noted Skin: Normal Psychiatry: Normal mood    Data Reviewed: I have personally reviewed following labs and imaging studies  CBC: Recent Labs  Lab 06/06/24 0209 06/07/24 0513 06/08/24 0453 06/09/24 0401 06/10/24 0440  WBC 14.7* 17.2* 19.1* 18.2* 10.9*  NEUTROABS  --   --   --   --  7.2  HGB 10.2* 9.2* 8.8* 8.3* 6.9*  HCT 34.3* 31.3* 30.5* 28.7* 23.1*  MCV 74.9* 75.6* 76.8* 76.5* 76.5*  PLT 240 228 228 235 189   Basic Metabolic Panel: Recent Labs  Lab 06/05/24 0430 06/06/24 2143 06/07/24 0513 06/08/24 0453 06/09/24 0401 06/10/24 0440  NA 137 139 140 142 136 134*  K 4.1 4.0 3.2* 3.3* 4.1 4.0  CL 104 99 99 104 102 104  CO2 25 31 32 28 26 24   GLUCOSE 120* 133* 140* 156* 128* 110*  BUN 15 28* 26* 23 18 17   CREATININE 0.71 0.66 0.62 0.64 0.57 0.53  CALCIUM  9.2 9.4 9.3 9.2 8.5* 8.5*  MG 2.1 2.2  --  2.1  --   --    GFR: Estimated Creatinine Clearance: 39.6 mL/min (by C-G formula based on SCr of 0.53 mg/dL). Liver Function Tests: Recent Labs  Lab 06/04/24 0007  AST 20  ALT 12  ALKPHOS 87  BILITOT <0.2  PROT 6.7  ALBUMIN 4.3   Recent Labs  Lab 06/04/24 0007  LIPASE 54*   No results for input(s): AMMONIA in the last 168 hours. Coagulation Profile: No results for input(s): INR, PROTIME in the last 168 hours. Cardiac Enzymes: No results for input(s): CKTOTAL, CKMB, CKMBINDEX, TROPONINI in the last 168 hours. BNP (last 3 results) No results for input(s): PROBNP in the last 8760 hours. HbA1C: No results for input(s): HGBA1C in the last 72 hours. CBG: Recent Labs  Lab  06/04/24 0130 06/06/24 0624  GLUCAP 165* 141*   Lipid Profile: No results for input(s): CHOL, HDL, LDLCALC, TRIG, CHOLHDL, LDLDIRECT in the last 72 hours.  Thyroid  Function Tests: No results for input(s): TSH, T4TOTAL, FREET4, T3FREE, THYROIDAB in the last 72 hours. Anemia Panel: Recent Labs    06/10/24 0440 06/10/24 0640  VITAMINB12  --  563  FOLATE 24.5 32.6  FERRITIN 6*  --   TIBC 325  --   IRON  15*  --   RETICCTPCT  --  2.9   Sepsis Labs: No results for input(s): PROCALCITON, LATICACIDVEN in the last 168 hours.  Recent Results (from the past 240 hours)  Respiratory (~20 pathogens) panel by PCR     Status: None   Collection Time: 06/04/24  7:57 AM   Specimen: Nasopharyngeal Swab; Respiratory  Result Value Ref Range Status   Adenovirus  NOT DETECTED NOT DETECTED Final   Coronavirus 229E NOT DETECTED NOT DETECTED Final    Comment: (NOTE) The Coronavirus on the Respiratory Panel, DOES NOT test for the novel  Coronavirus (2019 nCoV)    Coronavirus HKU1 NOT DETECTED NOT DETECTED Final   Coronavirus NL63 NOT DETECTED NOT DETECTED Final   Coronavirus OC43 NOT DETECTED NOT DETECTED Final   Metapneumovirus NOT DETECTED NOT DETECTED Final   Rhinovirus / Enterovirus NOT DETECTED NOT DETECTED Final   Influenza A NOT DETECTED NOT DETECTED Final   Influenza B NOT DETECTED NOT DETECTED Final   Parainfluenza Virus 1 NOT DETECTED NOT DETECTED Final   Parainfluenza Virus 2 NOT DETECTED NOT DETECTED Final   Parainfluenza Virus 3 NOT DETECTED NOT DETECTED Final   Parainfluenza Virus 4 NOT DETECTED NOT DETECTED Final   Respiratory Syncytial Virus NOT DETECTED NOT DETECTED Final   Bordetella pertussis NOT DETECTED NOT DETECTED Final   Bordetella Parapertussis NOT DETECTED NOT DETECTED Final   Chlamydophila pneumoniae NOT DETECTED NOT DETECTED Final   Mycoplasma pneumoniae NOT DETECTED NOT DETECTED Final    Comment: Performed at [facility], [address] [place], [place] [address]      Radiology Studies: No results found.     Scheduled Meds:  feeding supplement  237 mL Oral BID BM   ferrous sulfate   325 mg Oral Q breakfast   fluticasone  furoate-vilanterol  1 puff Inhalation Daily   pantoprazole  (PROTONIX ) IV  40 mg Intravenous Q12H   pravastatin   40 mg Oral QPM   senna-docusate  1 tablet Oral BID   sodium chloride  flush  3 mL Intravenous Q12H   Continuous Infusions:     LOS: 6 days     Lebron JINNY Cage, MD [facility] 06/10/2024, 5:16 PM   Available via Epic secure chat 7am-7pm After these hours, please refer to coverage provider listed on [email or web address]

## 2024-06-10 NOTE — TOC Progression Note (Signed)
 Transition of Care [facility]) - Progression Note    Patient Details  Name: Kristine [name] MRN: [id number] Date of Birth: Apr 22, 1941  [facility] CM/SW Contact  Isaiah Public, LCSWA Phone Number: 06/10/2024, 12:49 PM  Clinical Narrative:     Patient has SNF bed at [facility] and rehab when medically stable for dc. Insurance authorization approved through 7/18. CSW will continue to follow.  Expected Discharge Plan: Skilled Nursing Facility Barriers to Discharge: Continued Medical Work up  Expected Discharge Plan and Services In-house Referral: Clinical Social Work     Living arrangements for the past 2 months: Single Family Home                                       Social Determinants of Health (SDOH) Interventions SDOH Screenings   Food Insecurity: No Food Insecurity (06/04/2024)  Housing: Low Risk  (06/04/2024)  Transportation Needs: No Transportation Needs (06/04/2024)  Utilities: Not At Risk (06/04/2024)  Depression (PHQ2-9): Low Risk  (07/24/2021)  Recent Concern: Depression (PHQ2-9) - Medium Risk (05/25/2021)  Social Connections: Moderately Integrated (06/04/2024)  Stress: Stress Concern Present (03/26/2024)   Received from [facility]  Tobacco Use: Low Risk  (06/03/2024)    Readmission Risk Interventions     No data to display

## 2024-06-10 NOTE — Evaluation (Signed)
 Clinical/Bedside Swallow Evaluation Patient Details  Name: Kristine [name] MRN: [id number] Date of Birth: 1941/05/04  Today's Date: 06/10/2024 Time: SLP Start Time (ACUTE ONLY): 1055 SLP Stop Time (ACUTE ONLY): 1115 SLP Time Calculation (min) (ACUTE ONLY): 20 min  Past Medical History:  Past Medical History:  Diagnosis Date   Anemia    Arthritis    Asthma    Barrett's esophagus    Stage B   CAD (coronary artery disease)    a. s/p STEMI 7/14 >> LHC: LM 20, mLAD 30-40, small Dx 100 (L-L collats), OM1 90, OM2 30-40, mRCA 50-60, Lat/AL akinesis, EF 35-40% >> PCI: Promus DES to OM1;  b. Myoview 3/16: diaph atten, no ischemia, EF 59% (intermediate risk b/c BP drop 152>>137, but recovery BP 162 (tech error) >> med Rx continued  c. cath 05/30/17 s/p FFR guided PCI with DES to circumflex/first OM    COVID-19 virus infection    CREST variant of scleroderma (HCC)    Cystocele    Diverticulitis    Event monitor 04/2019   Event monitor 04/2019: Sinus rhythm; no atrial fibrillation/flutter; no bradycardic events; few PVCs and occasional ventricular runs up to 9 beats   GERD (gastroesophageal reflux disease)    History of blood transfusion    hx of being Anti-K positive   History of Doppler ultrasound    a. Carotid US  2/12: bilateral ICA < 50%   Hyperlipidemia    Hypertension    Ischemic cardiomyopathy    a. EF 35-40% by LHC at time of MI;  b. Echo 7/14:  Mild focal basal septal hypertrophy, EF 50-55%, apical HK, grade 1 diastolic dysfunction, trivial AI    Ischemic colitis (HCC)    Orthostatic hypotension    OSA on CPAP    Osteoporosis 12/2017   T score -2.6   Raynaud disease    Rectocele    mild   Past Surgical History:  Past Surgical History:  Procedure Laterality Date   ABDOMINAL HYSTERECTOMY  1996   RSO for menorrhagia.   CORONARY ANGIOPLASTY WITH STENT PLACEMENT  06/21/2013   CORONARY PRESSURE/FFR STUDY N/A 05/30/2017   Procedure: Intravascular Pressure Wire/FFR Study;  Surgeon: Wonda Sharper, MD;  Location: [facility] INVASIVE CV LAB;  Service: Cardiovascular;  Laterality: N/A;   CORONARY STENT INTERVENTION N/A 05/30/2017   Procedure: Coronary Stent Intervention;  Surgeon: Wonda Sharper, MD;  Location: [facility] INVASIVE CV LAB;  Service: Cardiovascular;  Laterality: N/A;   FOOT SURGERY  2013   x2   JOINT REPLACEMENT  9/10   rt total   LEFT HEART CATH AND CORONARY ANGIOGRAPHY N/A 05/30/2017   Procedure: Left Heart Cath and Coronary Angiography;  Surgeon: Wonda Sharper, MD;  Location: [facility] INVASIVE CV LAB;  Service: Cardiovascular;  Laterality: N/A;   LEFT HEART CATH AND CORONARY ANGIOGRAPHY N/A 06/07/2024   Procedure: LEFT HEART CATH AND CORONARY ANGIOGRAPHY;  Surgeon: Mady Bruckner, MD;  Location: [facility] INVASIVE CV LAB;  Service: Cardiovascular;  Laterality: N/A;   nissen fundoplication  2006   ROTATOR CUFF REPAIR     TONSILLECTOMY  1950   TOTAL KNEE ARTHROPLASTY     right   TUBAL LIGATION  1980   HPI:  Pt is 83 yo presenting to [facility] via transfer from [facility] on 7/10 due to shortness of breath. Work up revealed NSTEMI, and acute respiratory failure with hypoxia due to COPD exacerbation. S/p LHC on 7/14. PMH: CREST syndrome, CAD, STEMI s/p DES to OM1 in 2014, HLD, HTN, ischemic  colitis, GERD,Barrett's esophagus, OSA on CPAP, and OA. CT chest revealed No definite evidence of pulmonary embolus. Small bilateral pleural effusions with minimal adjacent subsegmental atelectasis. Stable severely dilated and fluid-filled esophagus is noted with associated hiatal hernia. MD note has diagnosis of achalasia, ?? posibility of aspiration and ST eval ordered. EGD 03/2024 at [facility] revealed dilated esophagus, abundant liquid and material suctioned; food adherent to esophageal wall. Pt reported having esophagus dilated several years ago.    Assessment / Plan / Recommendation  Clinical Impression  Pt has a suspected primary esophageal dysphagia with recent diagnosis of achalasia,  history of GERD, esophageal dilation 2 years ago and recent findings on EGD 03/2024- please refer to HPI for details. Pt denied recent difficulty swallowing or pharyngeal/chest globus sensation when eating. She denied difficulty consuming bread or meat. Pt states she starts to cough at times when she is not eating which can lead to difficulty breathing/SOB as in this hospital admisssion and pt diagnosed with NSTEMI and COPD exacerbation. Her oropharyngeal swallow  function was within normal limits without s/s aspiration and no suspicion of aspiration before or during the swallow although COPD places her at slightly increased risk. She is at highest risk post prandial given the aformentioned diagnosis. Pt's chest CT did not reveal consern for pna. SLP reviewed in detail esophageal precautions and reiterated importance. Pt's daughter arrived and SLP reviewed findings of this eval and recommendations including remain upright one hour after meals, eat smaller more frequent meals, eat 2 hours prior to bedtime, use of a wedge at night may be beneficial (pt states she has one but does not use). Pt and daughter verbalized understanding. Recommend she continue regular diet, thin liquids with precautions. Pt stated she has an appointment with her GI next month. No further ST services are needed and results discussed with MD. SLP Visit Diagnosis: Dysphagia, unspecified (R13.10)    Aspiration Risk  Moderate aspiration risk;Other (comment) (post prandial risk)    Diet Recommendation Regular;Thin liquid    Liquid Administration via: Cup;Straw Medication Administration: Whole meds with liquid Supervision: Patient able to self feed Postural Changes: Seated upright at 90 degrees;Remain upright for at least 30 minutes after po intake    Other  Recommendations Recommended Consults: Other (Comment) (has appoointment next month with GI) Oral Care Recommendations: Oral care BID     Assistance Recommended at Discharge     Functional Status Assessment Patient has not had a recent decline in their functional status  Frequency and Duration            Prognosis        Swallow Study   General Date of Onset: 06/03/24 HPI: Pt is 83 yo presenting to [facility] via transfer from [facility] on 7/10 due to shortness of breath. Work up revealed NSTEMI, and acute respiratory failure with hypoxia due to COPD exacerbation. S/p LHC on 7/14. PMH: CREST syndrome, CAD, STEMI s/p DES to OM1 in 2014, HLD, HTN, ischemic colitis, GERD,Barrett's esophagus, OSA on CPAP, and OA. CT chest revealed No definite evidence of pulmonary embolus. Small bilateral pleural effusions with minimal adjacent subsegmental atelectasis. Stable severely dilated and fluid-filled esophagus is noted with associated hiatal hernia. MD note has diagnosis of achalasia, ?? posibility of aspiration and ST eval ordered. EGD 03/2024 at [facility] revealed dilated esophagus, abundant liquid and material suctioned; food adherent to esophageal wall. Pt reported having esophagus dilated several years ago. Type of Study: Bedside Swallow Evaluation Previous Swallow Assessment:  (none)  Diet Prior to this Study: Regular;Thin liquids (Level 0) Temperature Spikes Noted: No Respiratory Status: Room air History of Recent Intubation: No Behavior/Cognition: Alert;Cooperative;Pleasant mood Oral Cavity Assessment: Within Functional Limits Oral Care Completed by SLP: No Oral Cavity - Dentition: Dentures, top;Other (Comment) (partial lower) Vision: Functional for self-feeding Self-Feeding Abilities: Able to feed self Patient Positioning: Upright in bed Baseline Vocal Quality: Normal Volitional Cough: Strong Volitional Swallow: Able to elicit    Oral/Motor/Sensory Function Overall Oral Motor/Sensory Function: Within functional limits   Ice Chips Ice chips: Not tested   Thin Liquid Thin Liquid: Within functional limits Presentation: Cup;Straw    Nectar Thick  Nectar Thick Liquid: Not tested   Honey Thick Honey Thick Liquid: Not tested   Puree Puree: Within functional limits   Solid     Solid: Within functional limits      Dustin Olam Bull 06/10/2024,11:52 AM

## 2024-06-10 NOTE — Plan of Care (Addendum)
 Acute on chronic microcytic anemia Patient has low hemoglobin 6.9 and low MCV.SABRA  No evidence of GI bleed. Patient be admitted for nSTEMI and eventually underwent heart cath which showed nonobstructive CAD.  Currently on aspirin , Lipitor and for DVT prophylaxis patient is on Lovenox . The setting of low hemoglobin holding Lovenox . Patient is hemodynamically stable. Obtaining stat type and screen and transfusing 1 unit of blood. Continue Protonix  40 mg daily.  Tyrea Froberg, MD [facility] 06/10/2024, 5:52 AM

## 2024-06-10 NOTE — Progress Notes (Signed)
 Date and time results received: 06/10/24 0548 (use smartphrase .now to insert current time)  Test: Hemoglobin  Critical Value: 6.9  Name of Provider Notified: Lee, MD  Orders Received? Or Actions Taken?: Orders Received - See Orders for details 1 unit RBC ordered

## 2024-06-11 ENCOUNTER — Other Ambulatory Visit (HOSPITAL_COMMUNITY): Payer: Self-pay | Admitting: Internal Medicine

## 2024-06-11 ENCOUNTER — Telehealth (HOSPITAL_COMMUNITY): Payer: Self-pay

## 2024-06-11 ENCOUNTER — Telehealth (HOSPITAL_COMMUNITY): Payer: Self-pay | Admitting: Internal Medicine

## 2024-06-11 ENCOUNTER — Encounter: Payer: Self-pay | Admitting: Advanced Practice Midwife

## 2024-06-11 DIAGNOSIS — I214 Non-ST elevation (NSTEMI) myocardial infarction: Secondary | ICD-10-CM | POA: Diagnosis not present

## 2024-06-11 LAB — BASIC METABOLIC PANEL WITH GFR
Anion gap: 7 (ref 5–15)
BUN: 16 mg/dL (ref 8–23)
CO2: 23 mmol/L (ref 22–32)
Calcium: 8.2 mg/dL — ABNORMAL LOW (ref 8.9–10.3)
Chloride: 106 mmol/L (ref 98–111)
Creatinine, Ser: 0.65 mg/dL (ref 0.44–1.00)
GFR, Estimated: >60 mL/min (ref >60)
Glucose, Bld: 100 mg/dL — ABNORMAL HIGH (ref 70–99)
Potassium: 3.4 mmol/L — ABNORMAL LOW (ref 3.5–5.1)
Sodium: 136 mmol/L (ref 135–145)

## 2024-06-11 LAB — CBC
HCT: 29.6 % — ABNORMAL LOW (ref 36.0–46.0)
Hemoglobin: 9.3 g/dL — ABNORMAL LOW (ref 12.0–15.0)
MCH: 24 pg — ABNORMAL LOW (ref 26.0–34.0)
MCHC: 31.4 g/dL (ref 30.0–36.0)
MCV: 76.5 fL — ABNORMAL LOW (ref 80.0–100.0)
Platelets: 168 K/uL (ref 150–400)
RBC: 3.87 MIL/uL (ref 3.87–5.11)
RDW: 19.4 % — ABNORMAL HIGH (ref 11.5–15.5)
WBC: 11.2 K/uL — ABNORMAL HIGH (ref 4.0–10.5)
nRBC: 0.3 % — ABNORMAL HIGH (ref 0.0–0.2)

## 2024-06-11 LAB — MAGNESIUM: Magnesium: 2 mg/dL (ref 1.7–2.4)

## 2024-06-11 MED ORDER — FERROUS SULFATE 325 (65 FE) MG PO TABS: 325.0000 mg | ORAL_TABLET | Freq: Every day | ORAL | Status: AC

## 2024-06-11 MED ORDER — IPRATROPIUM-ALBUTEROL 0.5-2.5 (3) MG/3ML IN SOLN: 3.0000 mL | Freq: Four times a day (QID) | RESPIRATORY_TRACT | Status: AC | PRN

## 2024-06-11 MED ORDER — SENNOSIDES-DOCUSATE SODIUM 8.6-50 MG PO TABS: 1.0000 | ORAL_TABLET | Freq: Two times a day (BID) | ORAL | Status: AC

## 2024-06-11 MED ORDER — PRAVASTATIN SODIUM 40 MG PO TABS: 40.0000 mg | ORAL_TABLET | Freq: Every evening | ORAL | Status: DC

## 2024-06-11 MED ORDER — FLUTICASONE FUROATE-VILANTEROL 100-25 MCG/ACT IN AEPB: 1.0000 | INHALATION_SPRAY | Freq: Every day | RESPIRATORY_TRACT | Status: AC

## 2024-06-11 MED ORDER — POTASSIUM CHLORIDE CRYS ER 20 MEQ PO TBCR
40.0000 meq | EXTENDED_RELEASE_TABLET | Freq: Once | ORAL | Status: AC
Start: 2024-06-11 — End: 2024-06-11
  Administered 2024-06-11: 40 meq via ORAL
  Filled 2024-06-11: qty 2

## 2024-06-11 MED ORDER — ENSURE PLUS HIGH PROTEIN PO LIQD: 237.0000 mL | Freq: Two times a day (BID) | ORAL | Status: DC

## 2024-06-11 NOTE — TOC Progression Note (Addendum)
 Transition of Care [facility]) - Progression Note    Patient Details  Name: Kristine [name] MRN: [id number] Date of Birth: May 22, 1941  Transition [facility] Care [facility]) CM/SW Contact  Isaiah Public, LCSWA Phone Number: 06/11/2024, 10:37 AM  Clinical Narrative:      Dufm with [facility] confirmed they can accept patient today if medically stable. Initially CSW informed by MD patient will need cpap at facility. CSW reached out to respiratory to request settings for cpap so that facility can order cpap. Respiratory informed CSW  on this admission pt hasn't worn cpap due to pt being non compliant and refusing CPAP.Respiratory informed CSW that there is no active CPAP order at this time.CSW informed MD.  Updated-MD confirmed no cpap needed at facility.CSW informed respiratory. CSW informed Nicki with [facility].CSW will continue to follow.    Expected Discharge Plan: Skilled Nursing Facility Barriers to Discharge: Continued Medical Work up  Expected Discharge Plan and Services In-house Referral: Clinical Social Work     Living arrangements for the past 2 months: Single Family Home Expected Discharge Date: 06/11/24                                     Social Determinants of Health (SDOH) Interventions SDOH Screenings   Food Insecurity: No Food Insecurity (06/04/2024)  Housing: Low Risk  (06/04/2024)  Transportation Needs: No Transportation Needs (06/04/2024)  Utilities: Not At Risk (06/04/2024)  Depression (PHQ2-9): Low Risk  (07/24/2021)  Recent Concern: Depression (PHQ2-9) - Medium Risk (05/25/2021)  Social Connections: Moderately Integrated (06/04/2024)  Stress: Stress Concern Present (03/26/2024)   Received from [facility]  Tobacco Use: Low Risk  (06/03/2024)    Readmission Risk Interventions     No data to display

## 2024-06-11 NOTE — Progress Notes (Signed)
 Social work reached out to RT in reference to pts CPAP settings. According to this admission (06/04/2024) pt has worn BIPAP for resp distress  with the settings IPAP 17 and EPAP 5. However on this admission pt hasn't worn cpap due to pt being non compliant and refusing CPAP as shown in flowsheet below. CPAP is capable of auto titrate however that would be medically prescribed by MD. There is no active CPAP order at this time, social work aware.      06/06/24 2336  BiPAP/CPAP/SIPAP  Reason BIPAP/CPAP not in use Non-compliant

## 2024-06-11 NOTE — Telephone Encounter (Signed)
 Patient referred to infusion pharmacy team for ambulatory infusion of IV iron .  Insurance - [employer] of care - Site of care: [facility] INF Dx code - D50.9/D62 IV Iron  Therapy - Feraheme 510 mg x 2 Infusion appointments - Scheduling team will schedule patient as soon as possible.   Norton Blush, PharmD Pharmacist [facility]

## 2024-06-11 NOTE — Discharge Summary (Addendum)
 Physician Discharge Summary   Patient: Kristine [name] MRN: [id number] DOB: 08-27-1941  Admit date:     06/03/2024  Discharge date: 06/11/24  Discharge Physician: Lebron JINNY Cage   PCP: Ileen Rosaline NOVAK, NP   Recommendations at discharge:   Follow-up with PCP in 1 week Follow-up with gastroenterology as scheduled Follow-up with cardiology as scheduled  Discharge Diagnoses: Principal Problem:   NSTEMI (non-ST elevated myocardial infarction) (HCC) Active Problems:   Hypertension   CAD (coronary artery disease), native coronary artery   Hyperlipidemia   OSA on CPAP   Cr(e)st syndrome (HCC)   Acute respiratory failure with hypoxia (HCC)   COPD exacerbation (HCC)   Dyspnea   Syncope, vasovagal   Acute on chronic diastolic CHF (congestive heart failure) [facility]    Hospital Course: Kristine [name] is a 83 y.o. female with medical history significant of CREST syndrome, CAD, STEMI s/p DES to OM1 in 2014, HLD, HTN, ischemic colitis, Barrett's esophagus, OSA on CPAP, and OA presented with shortness of breath. Had episode of syncope 7/13.  She reports that she was at the bedside commode, urinated, then developed flushing.  She became diaphoretic, lightheaded, had bradycardia down to heart rate of 29 before losing consciousness.  She recalls being back in bed and is able to relate the events that happened. Upon returning back to bed, her blood pressure and heart rate returned back to normal.  Blood sugar at this time was normal 141.Patient underwent heart cath 7/14.     Today, patient denies any new complaints.  Very excited to be discharged to [facility] as she has had a good experience at the facility.  Denies any chest pain, abdominal pain, nausea/vomiting, fever/chills.   Assessment and Plan:  NSTEMI History of CAD status post stent placement Hyperlipidemia Troponin 718, 629, 546 Cardiology signed off 7/14  Echocardiogram showed EF 50-50 left-ventricle demonstrates regional wall  motion abnl Heart cath 7/14 showed non-obstructive disease  Restart aspirin , metoprolol , continue pravastatin  D dimer elevated, CTA chest to ruled out PE  Outpatient follow-up with cardiology   Vasovagal syncope Resolved    Acute hypoxemic respiratory failure Moderate risk of aspiration Continue Breo, duoneb  SLP evaluation-moderate risk of aspiration, recommended regular, thin liquids using cup/straw, and show seated upright at 90 degree position while eating and at least for 30 minutes after   Leukocytosis In setting of steroid use Repeat labs in about a week  Hypertension BP stable Continue metoprolol    ??Acute on chronic diastolic heart failure Euvolemic, stable Continue metoprolol , currently Lasix  held   Achalasia CT showed stable dilated esophagus with air-fluid level GI consulted, they are not planning any further workup in the hospital Follow-up with outpatient GI in August   CREST syndrome Follow-up outpatient rheumatology appointment in August   Microcytic anemia/iron  def anemia Hemoglobin baseline around 10, dropped to 6.9 on 06/10/2024 Iron  panel showed iron  15, sats 5, ferritin 6 S/p 1 unit of PRBC on 06/10/2024, has antibody K Outpatient IV iron  infusion ordered Continue p.o. iron , strict bowel regimen   Hyperlipidemia Pravastatin    Stable 2 mm lung nodules Outpatient follow-up with PCP   Hypokalemia Replaced prn    Consultants: Cardiology Procedures performed: None Disposition: Skilled nursing facility Diet recommendation:  Cardiac diet    DISCHARGE MEDICATION: Allergies as of 06/11/2024       Reactions   Nsaids Other (See Comments)   Prone to bleeding   Aspirin  Other (See Comments)   Prone to bleeding OK to take low-dose EC but  not aspirin  based medications.        Medication List     STOP taking these medications    budesonide -formoterol  80-4.5 MCG/ACT inhaler Commonly known as: Symbicort  Replaced by: fluticasone   furoate-vilanterol 100-25 MCG/ACT Aepb       TAKE these medications    acetaminophen  500 MG tablet Commonly known as: TYLENOL  Take 1,000 mg by mouth every 6 (six) hours as needed for headache or moderate pain (pain score 4-6).   albuterol  108 (90 Base) MCG/ACT inhaler Commonly known as: VENTOLIN  HFA 1 puff as needed Inhalation every 4 hrs   aspirin  EC 81 MG tablet Take 1 tablet (81 mg total) by mouth daily.   b complex vitamins capsule Take 1 capsule by mouth daily.   famotidine  40 MG tablet Commonly known as: PEPCID  Take 40 mg by mouth at bedtime.   feeding supplement Liqd Take 237 mLs by mouth 2 (two) times daily between meals.   ferrous sulfate  325 (65 FE) MG tablet Take 1 tablet (325 mg total) by mouth daily with breakfast.   fluticasone  furoate-vilanterol 100-25 MCG/ACT Aepb Commonly known as: BREO ELLIPTA  Inhale 1 puff into the lungs daily. Start taking on: June 12, 2024 Replaces: budesonide -formoterol  80-4.5 MCG/ACT inhaler   ipratropium-albuterol  0.5-2.5 (3) MG/3ML Soln Commonly known as: DUONEB Take 3 mLs by nebulization every 6 (six) hours as needed.   MAGNESIUM  PO Take 1 tablet by mouth daily.   metoprolol  succinate 25 MG 24 hr tablet Commonly known as: Toprol  XL Take 0.5 tablets (12.5 mg total) by mouth daily.   nitroGLYCERIN  0.4 MG SL tablet Commonly known as: NITROSTAT  Place 1 tablet (0.4 mg total) under the tongue every 5 (five) minutes x 3 doses as needed for chest pain.   OLIVE LEAF EXTRACT PO Take 1 capsule by mouth daily.   pantoprazole  40 MG tablet Commonly known as: PROTONIX  Take 40 mg by mouth daily.   pravastatin  40 MG tablet Commonly known as: PRAVACHOL  Take 1 tablet (40 mg total) by mouth every evening. What changed:  medication strength how much to take   QUERCETIN PO Take 1 tablet by mouth daily.   senna-docusate 8.6-50 MG tablet Commonly known as: Senokot-S Take 1 tablet by mouth 2 (two) times daily.         Contact information for follow-up providers     Schmerge, Rosaline NOVAK, NP. Schedule an appointment as soon as possible for a visit in 1 week(s).   Specialty: Nurse Practitioner Contact information: [address] [place] [address] [phone]              Contact information for after-discharge care     Destination     [facility] .   Service: Skilled Nursing Contact information: [address] [place]  [address] [phone]                    Discharge Exam: Fredricka Weights   06/04/24 1609 06/07/24 0500  Weight: 48.8 kg 47.5 kg   General: NAD  Cardiovascular: S1, S2 present Respiratory: CTAB Abdomen: Soft, nontender, nondistended, bowel sounds present Musculoskeletal: No bilateral pedal edema noted Skin: Normal Psychiatry: Normal mood   Condition at discharge: stable  The results of significant diagnostics from this hospitalization (including imaging, microbiology, ancillary and laboratory) are listed below for reference.   Imaging Studies: CT Angio Chest Pulmonary Embolism (PE) W or WO Contrast Result Date: 06/08/2024 CLINICAL DATA:  Positive D-dimer. EXAM: CT ANGIOGRAPHY CHEST WITH CONTRAST  TECHNIQUE: Multidetector CT imaging of the chest was performed using the standard protocol during bolus administration of intravenous contrast. Multiplanar CT image reconstructions and MIPs were obtained to evaluate the vascular anatomy. RADIATION DOSE REDUCTION: This exam was performed according to the departmental dose-optimization program which includes automated exposure control, adjustment of the mA and/or kV according to patient size and/or use of iterative reconstruction technique. CONTRAST:  75mL OMNIPAQUE  IOHEXOL  350 MG/ML SOLN COMPARISON:  May 05, 2024. FINDINGS: Cardiovascular: Satisfactory opacification of the pulmonary arteries to the segmental level. No evidence of pulmonary embolism. Normal heart size. No  pericardial effusion. Mediastinum/Nodes: Stable severely dilated and fluid-filled esophagus is noted with associated hiatal hernia. No definite abnormality. Thyroid  gland is unremarkable. Lungs/Pleura: No pneumothorax is noted. Small bilateral pleural effusions are noted with minimal adjacent subsegmental atelectasis. Upper Abdomen: No acute abnormality. Musculoskeletal: No chest wall abnormality. No acute or significant osseous findings. Review of the MIP images confirms the above findings. IMPRESSION: No definite evidence of pulmonary embolus. Small bilateral pleural effusions with minimal adjacent subsegmental atelectasis. Stable severely dilated and fluid-filled esophagus is noted with associated hiatal hernia. Aortic Atherosclerosis (ICD10-I70.0). Electronically Signed   By: Lynwood Landy Raddle M.D.   On: 06/08/2024 15:37   CARDIAC CATHETERIZATION Result Date: 06/07/2024 Conclusions: Stable appearance of coronary arteries compared with last catheterization in 2018; there is mild-moderate, non-obstructive coronary artery disease involving the mid/distal LAD, mid LCx, and proximal/mid LAD. Widely patent overlapping LCx/OM1 stents. Normal left ventricular filling pressure (LVEDP 10 mmHg). Recommendations: Continue secondary prevention of coronary artery disease. Consider further evaluation for there potential causes of shortness of breath and elevated HS-TnI/BNP, including pulmonary embolism. [name], MD [facility]  ECHOCARDIOGRAM COMPLETE Result Date: 06/07/2024    ECHOCARDIOGRAM REPORT   Patient Name:   [facility] Date of Exam: 06/07/2024 Medical Rec #:  [id number]   Height:       60.0 in Accession #:    [id number]  Weight:       104.7 lb Date of Birth:  05-04-1941    BSA:          1.418 m Patient Age:    83 years    BP:           120/63 mmHg Patient Gender: F           HR:           89 bpm. Exam Location:  Inpatient Procedure: 2D Echo, 3D Echo, Color Doppler, Cardiac Doppler and Strain Analysis             (Both Spectral and Color Flow Doppler were utilized during            procedure). Indications:    Myocardial infarct I21.9  History:        Patient has prior history of Echocardiogram examinations, most                 recent 11/20/2023. CAD, Prior CABG, Signs/Symptoms:Dyspnea; Risk                 Factors:Sleep Apnea, Hypertension and Dyslipidemia.  Sonographer:    Koleen Popper RDCS Referring Phys: [id number] MAUDE JAYSON EMMER  Sonographer Comments: Global longitudinal strain was attempted. IMPRESSIONS  1. Left ventricular ejection fraction, by estimation, is 50 to 55%. The left ventricle has low normal function. The left ventricle demonstrates regional wall motion abnormalities (see scoring diagram/findings for description). Left ventricular diastolic  parameters were normal. The average left ventricular global longitudinal strain is -14.3 %.  The global longitudinal strain is abnormal.  2. Right ventricular systolic function is normal. The right ventricular size is normal. There is normal pulmonary artery systolic pressure. The estimated right ventricular systolic pressure is 21.0 mmHg.  3. The mitral valve is grossly normal. No evidence of mitral valve regurgitation. No evidence of mitral stenosis.  4. The aortic valve is tricuspid. Aortic valve regurgitation is mild. Aortic valve sclerosis is present, with no evidence of aortic valve stenosis.  5. The inferior vena cava is normal in size with greater than 50% respiratory variability, suggesting right atrial pressure of 3 mmHg. Comparison(s): A prior study was performed on 10/21/2023. No change in LVEF, subtle RWMA as noted above. FINDINGS  Left Ventricle: Left ventricular ejection fraction, by estimation, is 50 to 55%. The left ventricle has low normal function. The left ventricle demonstrates regional wall motion abnormalities. The average left ventricular global longitudinal strain is -14.3 %. Strain was performed and the global longitudinal strain is abnormal.  3D ejection fraction reviewed and evaluated as part of the interpretation. Alternate measurement of EF is felt to be most reflective of LV function. The left ventricular internal cavity size was small. There is borderline left ventricular hypertrophy. Left ventricular diastolic parameters were normal.  LV Wall Scoring: The mid and distal lateral wall and mid anterolateral segment are hypokinetic. Right Ventricle: The right ventricular size is normal. No increase in right ventricular wall thickness. Right ventricular systolic function is normal. There is normal pulmonary artery systolic pressure. The tricuspid regurgitant velocity is 2.12 m/s, and  with an assumed right atrial pressure of 3 mmHg, the estimated right ventricular systolic pressure is 21.0 mmHg. Left Atrium: Left atrial size was normal in size. Right Atrium: Right atrial size was normal in size. Pericardium: There is no evidence of pericardial effusion. Mitral Valve: The mitral valve is grossly normal. No evidence of mitral valve regurgitation. No evidence of mitral valve stenosis. Tricuspid Valve: The tricuspid valve is normal in structure. Tricuspid valve regurgitation is mild . No evidence of tricuspid stenosis. Aortic Valve: The aortic valve is tricuspid. Aortic valve regurgitation is mild. Aortic valve sclerosis is present, with no evidence of aortic valve stenosis. Pulmonic Valve: The pulmonic valve was normal in structure. Pulmonic valve regurgitation is not visualized. No evidence of pulmonic stenosis. Aorta: The aortic root and ascending aorta are structurally normal, with no evidence of dilitation. Venous: The inferior vena cava is normal in size with greater than 50% respiratory variability, suggesting right atrial pressure of 3 mmHg. IAS/Shunts: The interatrial septum was not well visualized.  LEFT VENTRICLE PLAX 2D LVIDd:         3.80 cm   Diastology LVIDs:         3.00 cm   LV e' medial:    4.90 cm/s LV PW:         1.10 cm   LV E/e'  medial:  9.7 LV IVS:        1.20 cm   LV e' lateral:   5.77 cm/s LVOT diam:     2.00 cm   LV E/e' lateral: 8.3 LV SV:         50 LV SV Index:   35        2D Longitudinal Strain LVOT Area:     3.14 cm  2D Strain GLS Avg:     -14.3 %  3D Volume EF:                          3D EF:        45 %                          LV EDV:       89 ml                          LV ESV:       49 ml                          LV SV:        40 ml RIGHT VENTRICLE             IVC RV Basal diam:  3.20 cm     IVC diam: 0.70 cm RV S prime:     17.30 cm/s TAPSE (M-mode): 2.1 cm LEFT ATRIUM             Index        RIGHT ATRIUM          Index LA diam:        2.70 cm 1.90 cm/m   RA Area:     8.64 cm LA Vol (A2C):   26.3 ml 18.55 ml/m  RA Volume:   15.80 ml 11.14 ml/m LA Vol (A4C):   22.0 ml 15.52 ml/m LA Biplane Vol: 25.4 ml 17.91 ml/m  AORTIC VALVE LVOT Vmax:   94.30 cm/s LVOT Vmean:  66.000 cm/s LVOT VTI:    0.159 m  AORTA Ao Root diam: 2.80 cm Ao Asc diam:  2.90 cm MITRAL VALVE               TRICUSPID VALVE MV Area (PHT): 2.87 cm    TR Peak grad:   18.0 mmHg MV Decel Time: 264 msec    TR Vmax:        212.00 cm/s MV E velocity: 47.70 cm/s MV A velocity: 76.00 cm/s  SHUNTS MV E/A ratio:  0.63        Systemic VTI:  0.16 m                            Systemic Diam: 2.00 cm Sunit Tolia Electronically signed by Madonna Large Signature Date/Time: 06/07/2024/1:35:36 PM    Final    DG Chest Port 1 View Result Date: 06/06/2024 EXAM: 1 VIEW XRAY OF THE CHEST 06/06/2024 07:40:35 AM COMPARISON: 1 view chest x-ray and CT chest without contrast 06/04/2024. CLINICAL HISTORY: Syncope. FINDINGS: LUNGS AND PLEURA: Changes of COPD are present. No focal pulmonary opacity. No pulmonary edema. No pleural effusion. No pneumothorax. HEART AND MEDIASTINUM: The heart is enlarged. Atherosclerotic changes are present at the aortic arch. Dilated esophagus is again noted. BONES AND SOFT TISSUES: No acute osseous abnormality. IMPRESSION: 1. No  acute findings. 2. Cardiomegaly without failure. 3. Changes of COPD. 4. Atherosclerotic changes at the aortic arch. 5. Dilated esophagus. Electronically signed by: Lonni Necessary MD 06/06/2024 07:45 AM EDT RP Workstation: [id number]   CT CHEST WO CONTRAST Result Date: 06/04/2024 CLINICAL DATA:  Dyspnea.  Chest wall disease suspected. EXAM: CT CHEST WITHOUT CONTRAST TECHNIQUE: Multidetector CT imaging of the chest was performed following the standard protocol without IV contrast. RADIATION  DOSE REDUCTION: This exam was performed according to the departmental dose-optimization program which includes automated exposure control, adjustment of the mA and/or kV according to patient size and/or use of iterative reconstruction technique. COMPARISON:  CT chest 12/13/2023 FINDINGS: Cardiovascular: No significant vascular findings. Normal heart size. No pericardial effusion. There are atherosclerotic calcifications of the aorta. Aberrant right subclavian artery again noted. Mediastinum/Nodes: Dilated esophagus with air-fluid level again seen. There surgical changes at the gastroesophageal junction. Visualized thyroid  gland within normal limits. No enlarged lymph nodes are identified. Lungs/Pleura: There are trace bilateral pleural effusions. There are atelectatic changes in the lingula and bilateral lower lobes. There is scarring in both lung apices. There are few scattered 2 mm nodular densities similar to prior. Upper Abdomen: No acute findings. Musculoskeletal: Mild T11 compression deformity is unchanged. IMPRESSION: 1. Trace bilateral pleural effusions with atelectatic changes in the lingula and bilateral lower lobes. 2. Stable dilated esophagus with air-fluid level. 3. Stable 2 mm nodular densities in the lungs. No follow-up needed if patient is low-risk (and has no known or suspected primary neoplasm). Non-contrast chest CT can be considered in 12 months if patient is high-risk. This recommendation follows the  consensus statement: Guidelines for Management of Incidental Pulmonary Nodules Detected on CT Images: From the Fleischner Society 2017; Radiology 2017; 284:228-243. 4. Aortic atherosclerosis. Aortic Atherosclerosis (ICD10-I70.0). Electronically Signed   By: Greig Pique M.D.   On: 06/04/2024 22:53   DG Chest Port 1 View Result Date: 06/04/2024 CLINICAL DATA:  Shortness of breath and wheezing. Tightness in chest. EXAM: PORTABLE CHEST 1 VIEW COMPARISON:  12/12/2023 FINDINGS: Dilated gas filled esophagogastric structure throughout the mediastinum. Appearance suggests marked esophageal dilatation, likely achalasia. Possible hiatal hernia. Similar appearance to previous study. Visualized abdominal stomach is also distended. Heart size and pulmonary vascularity are normal. Lungs are clear. Calcification of the aorta. IMPRESSION: Severely dilated gas-filled esophagus and stomach likely represent achalasia. No change. Lungs are clear. Electronically Signed   By: Elsie Gravely M.D.   On: 06/04/2024 00:37    Microbiology: Results for orders placed or performed during the hospital encounter of 06/03/24  Respiratory (~20 pathogens) panel by PCR     Status: None   Collection Time: 06/04/24  7:57 AM   Specimen: Nasopharyngeal Swab; Respiratory  Result Value Ref Range Status   Adenovirus NOT DETECTED NOT DETECTED Final   Coronavirus 229E NOT DETECTED NOT DETECTED Final    Comment: (NOTE) The Coronavirus on the Respiratory Panel, DOES NOT test for the novel  Coronavirus (2019 nCoV)    Coronavirus HKU1 NOT DETECTED NOT DETECTED Final   Coronavirus NL63 NOT DETECTED NOT DETECTED Final   Coronavirus OC43 NOT DETECTED NOT DETECTED Final   Metapneumovirus NOT DETECTED NOT DETECTED Final   Rhinovirus / Enterovirus NOT DETECTED NOT DETECTED Final   Influenza A NOT DETECTED NOT DETECTED Final   Influenza B NOT DETECTED NOT DETECTED Final   Parainfluenza Virus 1 NOT DETECTED NOT DETECTED Final   Parainfluenza  Virus 2 NOT DETECTED NOT DETECTED Final   Parainfluenza Virus 3 NOT DETECTED NOT DETECTED Final   Parainfluenza Virus 4 NOT DETECTED NOT DETECTED Final   Respiratory Syncytial Virus NOT DETECTED NOT DETECTED Final   Bordetella pertussis NOT DETECTED NOT DETECTED Final   Bordetella Parapertussis NOT DETECTED NOT DETECTED Final   Chlamydophila pneumoniae NOT DETECTED NOT DETECTED Final   Mycoplasma pneumoniae NOT DETECTED NOT DETECTED Final    Comment: Performed at [facility], [address] [place], [place]  [address]    Labs: CBC: Recent Labs  Lab 06/07/24 0513 06/08/24 0453 06/09/24 0401 06/10/24 0440 06/10/24 1738 06/11/24 0331  WBC 17.2* 19.1* 18.2* 10.9*  --  11.2*  NEUTROABS  --   --   --  7.2  --   --   HGB 9.2* 8.8* 8.3* 6.9* 10.0* 9.3*  HCT 31.3* 30.5* 28.7* 23.1* 31.6* 29.6*  MCV 75.6* 76.8* 76.5* 76.5*  --  76.5*  PLT 228 228 235 189  --  168   Basic Metabolic Panel: Recent Labs  Lab 06/05/24 0430 06/06/24 2143 06/07/24 0513 06/08/24 0453 06/09/24 0401 06/10/24 0440 06/11/24 0331  NA 137 139 140 142 136 134* 136  K 4.1 4.0 3.2* 3.3* 4.1 4.0 3.4*  CL 104 99 99 104 102 104 106  CO2 25 31 32 28 26 24 23   GLUCOSE 120* 133* 140* 156* 128* 110* 100*  BUN 15 28* 26* 23 18 17 16   CREATININE 0.71 0.66 0.62 0.64 0.57 0.53 0.65  CALCIUM  9.2 9.4 9.3 9.2 8.5* 8.5* 8.2*  MG 2.1 2.2  --  2.1  --   --  2.0   Liver Function Tests: No results for input(s): AST, ALT, ALKPHOS, BILITOT, PROT, ALBUMIN in the last 168 hours. CBG: Recent Labs  Lab 06/06/24 0624  GLUCAP 141*    Discharge time spent: greater than 30 minutes.  Signed: Lebron JINNY Cage, MD [facility] 06/11/2024

## 2024-06-11 NOTE — Plan of Care (Signed)
  Problem: Health Behavior/Discharge Planning: Goal: Ability to manage health-related needs will improve Outcome: Progressing   Problem: Clinical Measurements: Goal: Will remain free from infection Outcome: Progressing Goal: Respiratory complications will improve Outcome: Progressing Goal: Cardiovascular complication will be avoided Outcome: Progressing   Problem: Activity: Goal: Risk for activity intolerance will decrease Outcome: Progressing   Problem: Nutrition: Goal: Adequate nutrition will be maintained Outcome: Progressing   Problem: Coping: Goal: Level of anxiety will decrease Outcome: Progressing   Problem: Safety: Goal: Ability to remain free from injury will improve Outcome: Progressing   Problem: Activity: Goal: Ability to return to baseline activity level will improve Outcome: Progressing   Problem: Cardiovascular: Goal: Ability to achieve and maintain adequate cardiovascular perfusion will improve Outcome: Progressing Goal: Vascular access site(s) Level 0-1 will be maintained Outcome: Progressing

## 2024-06-11 NOTE — TOC Transition Note (Signed)
 Transition of Care [facility]) - Discharge Note   Patient Details  Name: Kristine [name] MRN: [id number] Date of Birth: 01-25-1941  Transition of Care [facility]) CM/SW Contact:  Isaiah Public, LCSWA Phone Number: 06/11/2024, 11:00 AM   Clinical Narrative:     Patient will DC to: [facility]  Anticipated DC date: 06/11/2024  Family notified: Sport and exercise psychologist by: [place]  ?  Per MD patient ready for DC to [facility] . RN, patient, patient's family, and facility notified of DC. Discharge Summary sent to facility. RN given number for report [phone] RM# [phone]. DC packet on chart. Ambulance transport requested for patient.  CSW signing off.   Final next level of care: Skilled Nursing Facility Barriers to Discharge: No Barriers Identified   Patient Goals and CMS Choice Patient states their goals for this hospitalization and ongoing recovery are:: SNF CMS Medicare.gov Compare Post Acute Care list provided to:: Patient Choice offered to / list presented to : Patient, Adult Children (patient and daughter heather)      Discharge Placement              Patient chooses bed at: [facility] Patient to be transferred to facility by: [facility] Name of family member notified: [place] Patient and family notified of of transfer: 06/11/24  Discharge Plan and Services Additional resources added to the After Visit Summary for   In-house Referral: Clinical Social Work                                   Social Drivers of Health (SDOH) Interventions SDOH Screenings   Food Insecurity: No Food Insecurity (06/04/2024)  Housing: Low Risk  (06/04/2024)  Transportation Needs: No Transportation Needs (06/04/2024)  Utilities: Not At Risk (06/04/2024)  Depression (PHQ2-9): Low Risk  (07/24/2021)  Recent Concern: Depression (PHQ2-9) - Medium Risk (05/25/2021)  Social Connections: Moderately Integrated (06/04/2024)  Stress: Stress Concern Present (03/26/2024)    Received from [facility]  Tobacco Use: Low Risk  (06/03/2024)     Readmission Risk Interventions     No data to display

## 2024-06-11 NOTE — Progress Notes (Signed)
 Report called at 1139 to [facility] at [facility]. AVS education explained to the patient. She denies any further questions or concerns. Patient d/c'ed to discharge lounge. All patient belongings returned. PIV removed. Vitals stable. Patient denies pain.

## 2024-06-11 NOTE — Telephone Encounter (Signed)
 Auth Submission: NO AUTH NEEDED Site of care: Site of care: [facility] Payer: [employer] Medicare Medication & CPT/J Code(s) submitted: Feraheme (ferumoxytol) [id number] Diagnosis Code: D50.9, D62 Route of submission (phone, fax, portal):  Phone # Fax # Auth type: Buy/Bill HB Units/visits requested: 510mg  x 2 doses Reference number:  Approval from: 06/11/24 to 10/12/24

## 2024-06-12 LAB — TYPE AND SCREEN
ABO/RH(D): A POS
Antibody Screen: POSITIVE
Donor AG Type: NEGATIVE
Donor AG Type: NEGATIVE
Unit division: 0
Unit division: 0

## 2024-06-12 LAB — BPAM RBC
Blood Product Expiration Date: 202508052359
Blood Product Expiration Date: 202508052359
ISSUE DATE / TIME: 202507171402
Unit Type and Rh: 6200
Unit Type and Rh: 6200

## 2024-06-13 ENCOUNTER — Encounter (HOSPITAL_COMMUNITY): Payer: Self-pay | Admitting: Emergency Medicine

## 2024-06-13 ENCOUNTER — Emergency Department (HOSPITAL_COMMUNITY)

## 2024-06-13 ENCOUNTER — Other Ambulatory Visit: Payer: Self-pay

## 2024-06-13 ENCOUNTER — Inpatient Hospital Stay (HOSPITAL_COMMUNITY)
Admission: EM | Admit: 2024-06-13 | Discharge: 2024-06-18 | DRG: 811 | Disposition: A | Discharge status: Skilled Nursing Facility | Source: Skilled Nursing Facility | Attending: Internal Medicine | Admitting: Internal Medicine

## 2024-06-13 DIAGNOSIS — Z818 Family history of other mental and behavioral disorders: Secondary | ICD-10-CM

## 2024-06-13 DIAGNOSIS — Z800 Family history of malignant neoplasm of digestive organs: Secondary | ICD-10-CM

## 2024-06-13 DIAGNOSIS — I252 Old myocardial infarction: Secondary | ICD-10-CM

## 2024-06-13 DIAGNOSIS — Z955 Presence of coronary angioplasty implant and graft: Secondary | ICD-10-CM

## 2024-06-13 DIAGNOSIS — Z7982 Long term (current) use of aspirin: Secondary | ICD-10-CM

## 2024-06-13 DIAGNOSIS — Z801 Family history of malignant neoplasm of trachea, bronchus and lung: Secondary | ICD-10-CM

## 2024-06-13 DIAGNOSIS — E873 Alkalosis: Secondary | ICD-10-CM | POA: Diagnosis present

## 2024-06-13 DIAGNOSIS — Z96651 Presence of right artificial knee joint: Secondary | ICD-10-CM | POA: Diagnosis present

## 2024-06-13 DIAGNOSIS — Z825 Family history of asthma and other chronic lower respiratory diseases: Secondary | ICD-10-CM

## 2024-06-13 DIAGNOSIS — Z7951 Long term (current) use of inhaled steroids: Secondary | ICD-10-CM

## 2024-06-13 DIAGNOSIS — D62 Acute posthemorrhagic anemia: Principal | ICD-10-CM | POA: Diagnosis present

## 2024-06-13 DIAGNOSIS — R197 Diarrhea, unspecified: Secondary | ICD-10-CM | POA: Diagnosis not present

## 2024-06-13 DIAGNOSIS — K2211 Ulcer of esophagus with bleeding: Secondary | ICD-10-CM | POA: Diagnosis present

## 2024-06-13 DIAGNOSIS — Z538 Procedure and treatment not carried out for other reasons: Secondary | ICD-10-CM | POA: Diagnosis present

## 2024-06-13 DIAGNOSIS — R1013 Epigastric pain: Secondary | ICD-10-CM

## 2024-06-13 DIAGNOSIS — I2510 Atherosclerotic heart disease of native coronary artery without angina pectoris: Secondary | ICD-10-CM | POA: Diagnosis not present

## 2024-06-13 DIAGNOSIS — I214 Non-ST elevation (NSTEMI) myocardial infarction: Secondary | ICD-10-CM | POA: Diagnosis present

## 2024-06-13 DIAGNOSIS — J4520 Mild intermittent asthma, uncomplicated: Secondary | ICD-10-CM | POA: Diagnosis present

## 2024-06-13 DIAGNOSIS — M810 Age-related osteoporosis without current pathological fracture: Secondary | ICD-10-CM | POA: Diagnosis present

## 2024-06-13 DIAGNOSIS — Z8616 Personal history of COVID-19: Secondary | ICD-10-CM

## 2024-06-13 DIAGNOSIS — K219 Gastro-esophageal reflux disease without esophagitis: Secondary | ICD-10-CM | POA: Diagnosis present

## 2024-06-13 DIAGNOSIS — Z8249 Family history of ischemic heart disease and other diseases of the circulatory system: Secondary | ICD-10-CM

## 2024-06-13 DIAGNOSIS — Z886 Allergy status to analgesic agent status: Secondary | ICD-10-CM

## 2024-06-13 DIAGNOSIS — E785 Hyperlipidemia, unspecified: Secondary | ICD-10-CM | POA: Diagnosis present

## 2024-06-13 DIAGNOSIS — G4733 Obstructive sleep apnea (adult) (pediatric): Secondary | ICD-10-CM | POA: Diagnosis present

## 2024-06-13 DIAGNOSIS — D500 Iron deficiency anemia secondary to blood loss (chronic): Secondary | ICD-10-CM

## 2024-06-13 DIAGNOSIS — I959 Hypotension, unspecified: Secondary | ICD-10-CM | POA: Insufficient documentation

## 2024-06-13 DIAGNOSIS — K2970 Gastritis, unspecified, without bleeding: Secondary | ICD-10-CM | POA: Diagnosis present

## 2024-06-13 DIAGNOSIS — N390 Urinary tract infection, site not specified: Secondary | ICD-10-CM | POA: Insufficient documentation

## 2024-06-13 DIAGNOSIS — E7800 Pure hypercholesterolemia, unspecified: Secondary | ICD-10-CM

## 2024-06-13 DIAGNOSIS — K921 Melena: Secondary | ICD-10-CM

## 2024-06-13 DIAGNOSIS — I10 Essential (primary) hypertension: Secondary | ICD-10-CM | POA: Diagnosis present

## 2024-06-13 DIAGNOSIS — N3000 Acute cystitis without hematuria: Principal | ICD-10-CM

## 2024-06-13 DIAGNOSIS — Z79899 Other long term (current) drug therapy: Secondary | ICD-10-CM

## 2024-06-13 DIAGNOSIS — I9589 Other hypotension: Secondary | ICD-10-CM | POA: Diagnosis present

## 2024-06-13 DIAGNOSIS — K220 Achalasia of cardia: Secondary | ICD-10-CM | POA: Diagnosis present

## 2024-06-13 DIAGNOSIS — E876 Hypokalemia: Secondary | ICD-10-CM | POA: Diagnosis present

## 2024-06-13 DIAGNOSIS — K449 Diaphragmatic hernia without obstruction or gangrene: Secondary | ICD-10-CM | POA: Diagnosis present

## 2024-06-13 DIAGNOSIS — R531 Weakness: Secondary | ICD-10-CM

## 2024-06-13 DIAGNOSIS — M341 CR(E)ST syndrome: Secondary | ICD-10-CM | POA: Diagnosis present

## 2024-06-13 DIAGNOSIS — I255 Ischemic cardiomyopathy: Secondary | ICD-10-CM | POA: Diagnosis present

## 2024-06-13 DIAGNOSIS — R55 Syncope and collapse: Secondary | ICD-10-CM | POA: Insufficient documentation

## 2024-06-13 LAB — COMPREHENSIVE METABOLIC PANEL WITH GFR
ALT: 16 U/L (ref 0–44)
AST: 20 U/L (ref 15–41)
Albumin: 2.4 g/dL — ABNORMAL LOW (ref 3.5–5.0)
Alkaline Phosphatase: 43 U/L (ref 38–126)
Anion gap: 9 (ref 5–15)
BUN: 21 mg/dL (ref 8–23)
CO2: 23 mmol/L (ref 22–32)
Calcium: 8.3 mg/dL — ABNORMAL LOW (ref 8.9–10.3)
Chloride: 105 mmol/L (ref 98–111)
Creatinine, Ser: 0.61 mg/dL (ref 0.44–1.00)
GFR, Estimated: >60 mL/min (ref >60)
Glucose, Bld: 132 mg/dL — ABNORMAL HIGH (ref 70–99)
Potassium: 4 mmol/L (ref 3.5–5.1)
Sodium: 137 mmol/L (ref 135–145)
Total Bilirubin: 0.4 mg/dL (ref 0.0–1.2)
Total Protein: 4.5 g/dL — ABNORMAL LOW (ref 6.5–8.1)

## 2024-06-13 LAB — I-STAT CHEM 8, ED
BUN: 22 mg/dL (ref 8–23)
Calcium, Ion: 0.99 mmol/L — ABNORMAL LOW (ref 1.15–1.40)
Chloride: 105 mmol/L (ref 98–111)
Creatinine, Ser: 0.6 mg/dL (ref 0.44–1.00)
Glucose, Bld: 125 mg/dL — ABNORMAL HIGH (ref 70–99)
HCT: 28 % — ABNORMAL LOW (ref 36.0–46.0)
Hemoglobin: 9.5 g/dL — ABNORMAL LOW (ref 12.0–15.0)
Potassium: 4 mmol/L (ref 3.5–5.1)
Sodium: 137 mmol/L (ref 135–145)
TCO2: 24 mmol/L (ref 22–32)

## 2024-06-13 LAB — URINALYSIS, ROUTINE W REFLEX MICROSCOPIC
Bilirubin Urine: NEGATIVE
Glucose, UA: NEGATIVE mg/dL
Hgb urine dipstick: NEGATIVE
Ketones, ur: 5 mg/dL — AB
Nitrite: NEGATIVE
Protein, ur: NEGATIVE mg/dL
Specific Gravity, Urine: 1.015 (ref 1.005–1.030)
WBC, UA: >50 WBC/hpf (ref 0–5)
pH: 7 (ref 5.0–8.0)

## 2024-06-13 LAB — I-STAT CG4 LACTIC ACID, ED
Lactic Acid, Venous: 2.9 mmol/L (ref 0.5–1.9)
Lactic Acid, Venous: 1 mmol/L (ref 0.5–1.9)

## 2024-06-13 LAB — CBC WITH DIFFERENTIAL/PLATELET
Abs Immature Granulocytes: 0.4 K/uL — ABNORMAL HIGH (ref 0.00–0.07)
Basophils Absolute: 0.1 K/uL (ref 0.0–0.1)
Basophils Relative: 0 %
Eosinophils Absolute: 0.1 K/uL (ref 0.0–0.5)
Eosinophils Relative: 0 %
HCT: 31.3 % — ABNORMAL LOW (ref 36.0–46.0)
Hemoglobin: 9.2 g/dL — ABNORMAL LOW (ref 12.0–15.0)
Immature Granulocytes: 2 %
Lymphocytes Relative: 7 %
Lymphs Abs: 1.4 K/uL (ref 0.7–4.0)
MCH: 24.8 pg — ABNORMAL LOW (ref 26.0–34.0)
MCHC: 29.4 g/dL — ABNORMAL LOW (ref 30.0–36.0)
MCV: 84.4 fL (ref 80.0–100.0)
Monocytes Absolute: 2.7 K/uL — ABNORMAL HIGH (ref 0.1–1.0)
Monocytes Relative: 13 %
Neutro Abs: 16.5 K/uL — ABNORMAL HIGH (ref 1.7–7.7)
Neutrophils Relative %: 78 %
Platelets: 255 K/uL (ref 150–400)
RBC: 3.71 MIL/uL — ABNORMAL LOW (ref 3.87–5.11)
RDW: 22.6 % — ABNORMAL HIGH (ref 11.5–15.5)
Smear Review: NORMAL
WBC: 21.1 K/uL — ABNORMAL HIGH (ref 4.0–10.5)
nRBC: 0 % (ref 0.0–0.2)

## 2024-06-13 LAB — TROPONIN I (HIGH SENSITIVITY)
Troponin I (High Sensitivity): 38 ng/L — ABNORMAL HIGH (ref <18)
Troponin I (High Sensitivity): 33 ng/L — ABNORMAL HIGH (ref <18)

## 2024-06-13 LAB — I-STAT VENOUS BLOOD GAS, ED
Acid-Base Excess: 3 mmol/L — ABNORMAL HIGH (ref 0.0–2.0)
Bicarbonate: 22.8 mmol/L (ref 20.0–28.0)
Calcium, Ion: 0.97 mmol/L — ABNORMAL LOW (ref 1.15–1.40)
HCT: 27 % — ABNORMAL LOW (ref 36.0–46.0)
Hemoglobin: 9.2 g/dL — ABNORMAL LOW (ref 12.0–15.0)
O2 Saturation: 99 %
Potassium: 4 mmol/L (ref 3.5–5.1)
Sodium: 137 mmol/L (ref 135–145)
TCO2: 23 mmol/L (ref 22–32)
pCO2, Ven: 21 mmHg — ABNORMAL LOW (ref 44–60)
pH, Ven: 7.644 (ref 7.25–7.43)
pO2, Ven: 110 mmHg — ABNORMAL HIGH (ref 32–45)

## 2024-06-13 LAB — POC OCCULT BLOOD, ED: Fecal Occult Bld: NEGATIVE

## 2024-06-13 LAB — LIPASE, BLOOD: Lipase: 40 U/L (ref 11–51)

## 2024-06-13 LAB — BRAIN NATRIURETIC PEPTIDE: B Natriuretic Peptide: 541.3 pg/mL — ABNORMAL HIGH (ref 0.0–100.0)

## 2024-06-13 MED ORDER — METOPROLOL SUCCINATE ER 25 MG PO TB24
12.5000 mg | ORAL_TABLET | Freq: Every day | ORAL | Status: DC
  Filled 2024-06-13 (×2): qty 1

## 2024-06-13 MED ORDER — SODIUM CHLORIDE 0.9% FLUSH
3.0000 mL | Freq: Two times a day (BID) | INTRAVENOUS | Status: DC
  Administered 2024-06-13 – 2024-06-18 (×9): 3 mL via INTRAVENOUS

## 2024-06-13 MED ORDER — SODIUM CHLORIDE 0.9 % IV SOLN
1.0000 g | Freq: Once | INTRAVENOUS | Status: AC
  Administered 2024-06-13: 1 g via INTRAVENOUS
  Filled 2024-06-13: qty 10

## 2024-06-13 MED ORDER — FLUTICASONE FUROATE-VILANTEROL 100-25 MCG/ACT IN AEPB
1.0000 | INHALATION_SPRAY | Freq: Every day | RESPIRATORY_TRACT | Status: DC
  Administered 2024-06-14 – 2024-06-18 (×5): 1 via RESPIRATORY_TRACT
  Filled 2024-06-13: qty 28

## 2024-06-13 MED ORDER — ASPIRIN 81 MG PO TBEC
81.0000 mg | DELAYED_RELEASE_TABLET | Freq: Every day | ORAL | Status: DC
  Administered 2024-06-14 – 2024-06-15 (×2): 81 mg via ORAL
  Filled 2024-06-13 (×2): qty 1

## 2024-06-13 MED ORDER — PANTOPRAZOLE SODIUM 40 MG IV SOLR
40.0000 mg | Freq: Once | INTRAVENOUS | Status: AC
  Administered 2024-06-13: 40 mg via INTRAVENOUS
  Filled 2024-06-13: qty 10

## 2024-06-13 MED ORDER — ACETAMINOPHEN 650 MG RE SUPP: 650.0000 mg | Freq: Four times a day (QID) | RECTAL | Status: DC | PRN

## 2024-06-13 MED ORDER — SODIUM CHLORIDE 0.9 % IV SOLN
1.0000 g | INTRAVENOUS | Status: DC
  Administered 2024-06-14 – 2024-06-16 (×3): 1 g via INTRAVENOUS
  Filled 2024-06-13 (×3): qty 10

## 2024-06-13 MED ORDER — ENOXAPARIN SODIUM 40 MG/0.4ML IJ SOSY
40.0000 mg | PREFILLED_SYRINGE | INTRAMUSCULAR | Status: DC
  Administered 2024-06-13: 40 mg via SUBCUTANEOUS
  Filled 2024-06-13: qty 0.4

## 2024-06-13 MED ORDER — ACETAMINOPHEN 325 MG PO TABS
650.0000 mg | ORAL_TABLET | Freq: Four times a day (QID) | ORAL | Status: DC | PRN
  Administered 2024-06-16: 650 mg via ORAL
  Filled 2024-06-13: qty 2

## 2024-06-13 MED ORDER — FAMOTIDINE 20 MG PO TABS
40.0000 mg | ORAL_TABLET | Freq: Every day | ORAL | Status: DC
  Administered 2024-06-13 – 2024-06-14 (×2): 40 mg via ORAL
  Filled 2024-06-13 (×3): qty 2

## 2024-06-13 MED ORDER — ENSURE PLUS HIGH PROTEIN PO LIQD
237.0000 mL | Freq: Two times a day (BID) | ORAL | Status: DC
  Administered 2024-06-14 – 2024-06-18 (×4): 237 mL via ORAL

## 2024-06-13 MED ORDER — LACTATED RINGERS IV BOLUS
1000.0000 mL | Freq: Once | INTRAVENOUS | Status: AC
  Administered 2024-06-13: 1000 mL via INTRAVENOUS

## 2024-06-13 MED ORDER — ALBUTEROL SULFATE (2.5 MG/3ML) 0.083% IN NEBU: 3.0000 mL | INHALATION_SOLUTION | RESPIRATORY_TRACT | Status: DC | PRN

## 2024-06-13 MED ORDER — SODIUM CHLORIDE 0.9 % IV SOLN: INTRAVENOUS | Status: DC

## 2024-06-13 MED ORDER — PANTOPRAZOLE SODIUM 40 MG PO TBEC
40.0000 mg | DELAYED_RELEASE_TABLET | Freq: Every day | ORAL | Status: DC
  Administered 2024-06-14: 40 mg via ORAL
  Filled 2024-06-13: qty 1

## 2024-06-13 MED ORDER — PRAVASTATIN SODIUM 40 MG PO TABS
40.0000 mg | ORAL_TABLET | Freq: Every evening | ORAL | Status: DC
  Administered 2024-06-13 – 2024-06-17 (×4): 40 mg via ORAL
  Filled 2024-06-13 (×4): qty 1

## 2024-06-13 NOTE — ED Notes (Signed)
 CCMD called for central monitoring.

## 2024-06-13 NOTE — ED Provider Notes (Signed)
 [facility] EMERGENCY DEPARTMENT AT [facility] Provider Note   CSN: [id number] Arrival date & time: 06/13/24  9092     Patient presents with: Weakness   Kristine [name] is a 83 y.o. female.   HPI Patient was discharged for rehab to [facility] SN[facility] after NSTEMI 2 days ago.  Patient also had a drop in hemoglobin with diagnosis of microcytic anemia with iron  deficiency.  Patient was transfused 1 unit of blood and recommended for outpatient iron  infusion.  Patient reports that she has been eating well since getting to [facility].  However, she has been getting stool softeners and laxatives and having several very liquidy stools per day.  She reports they are both brown-green and slimy in appearance.  She denies lower abdominal pain but has had constant burning in her epigastrium and lower chest.  Patient reports this is due to a hiatal hernia which she has had for a long time.  She reports her trying to give her medications for it but is not really effective.  Patient reports that she became very weak today and had a near fall as well as documented low blood pressure.    Prior to Admission medications   Medication Sig Start Date End Date Taking? Authorizing Provider  acetaminophen  (TYLENOL ) 500 MG tablet Take 1,000 mg by mouth every 6 (six) hours as needed for headache or moderate pain (pain score 4-6).    [provider]  albuterol  (VENTOLIN  HFA) 108 (90 Base) MCG/ACT inhaler 1 puff as needed Inhalation every 4 hrs 03/14/22   [provider]  aspirin  EC 81 MG EC tablet Take 1 tablet (81 mg total) by mouth daily. 06/24/13   Barrett, Shona MATSU, PA-C  b complex vitamins capsule Take 1 capsule by mouth daily.    [provider]  famotidine  (PEPCID ) 40 MG tablet Take 40 mg by mouth at bedtime.    [provider]  feeding supplement (ENSURE PLUS HIGH PROTEIN) LIQD Take 237 mLs by mouth 2 (two) times daily between meals. 06/11/24   Ezenduka, Nkeiruka J, MD   ferrous sulfate  325 (65 FE) MG tablet Take 1 tablet (325 mg total) by mouth daily with breakfast. 06/11/24   Ezenduka, Nkeiruka J, MD  fluticasone  furoate-vilanterol (BREO ELLIPTA ) 100-25 MCG/ACT AEPB Inhale 1 puff into the lungs daily. 06/12/24   Ezenduka, Nkeiruka J, MD  ipratropium-albuterol  (DUONEB) 0.5-2.5 (3) MG/3ML SOLN Take 3 mLs by nebulization every 6 (six) hours as needed. 06/11/24   Ezenduka, Nkeiruka J, MD  MAGNESIUM  PO Take 1 tablet by mouth daily.    [provider]  metoprolol  succinate (TOPROL  XL) 25 MG 24 hr tablet Take 0.5 tablets (12.5 mg total) by mouth daily. 07/30/23   Lelon Hamilton T, PA-C  nitroGLYCERIN  (NITROSTAT ) 0.4 MG SL tablet Place 1 tablet (0.4 mg total) under the tongue every 5 (five) minutes x 3 doses as needed for chest pain. 06/11/23   Weaver, Scott T, PA-C  OLIVE LEAF EXTRACT PO Take 1 capsule by mouth daily.    [provider]  pantoprazole  (PROTONIX ) 40 MG tablet Take 40 mg by mouth daily.    [provider]  pravastatin  (PRAVACHOL ) 40 MG tablet Take 1 tablet (40 mg total) by mouth every evening. 06/11/24   Ezenduka, Nkeiruka J, MD  QUERCETIN PO Take 1 tablet by mouth daily.    [provider]  senna-docusate (SENOKOT-S) 8.6-50 MG tablet Take 1 tablet by mouth 2 (two) times daily. 06/11/24   Ezenduka, Nkeiruka  J, MD    Allergies: Nsaids and Aspirin     Review of Systems  Updated Vital Signs BP (!) 119/49   Pulse 92   Temp 98.4 F (36.9 C) (Oral)   Resp 18   LMP 11/25/1994   SpO2 98%   Physical Exam Constitutional:      Comments: Patient is alert.  No respiratory distress.  Pale in appearance.  HENT:     Mouth/Throat:     Pharynx: Oropharynx is clear.  Eyes:     Extraocular Movements: Extraocular movements intact.  Cardiovascular:     Rate and Rhythm: Normal rate and regular rhythm.  Pulmonary:     Effort: Pulmonary effort is normal.     Breath sounds: Normal breath sounds.  Abdominal:     General: There is no  distension.     Palpations: Abdomen is soft.     Tenderness: There is no abdominal tenderness. There is no guarding.  Genitourinary:    Comments: No stool in the rectal vault. Musculoskeletal:        General: No swelling or tenderness.     Right lower leg: No edema.     Left lower leg: No edema.     Comments: Calves are soft nontender.  No peripheral edema.  Skin:    General: Skin is warm and dry.     Coloration: Skin is pale.  Neurological:     General: No focal deficit present.     Mental Status: She is oriented to person, place, and time.     Comments: No focal weakness or motor dysfunction.  Psychiatric:        Mood and Affect: Mood normal.     (all labs ordered are listed, but only abnormal results are displayed) Labs Reviewed  BRAIN NATRIURETIC PEPTIDE - Abnormal; Notable for the following components:      Result Value   B Natriuretic Peptide 541.3 (*)    All other components within normal limits  COMPREHENSIVE METABOLIC PANEL WITH GFR - Abnormal; Notable for the following components:   Glucose, Bld 132 (*)    Calcium  8.3 (*)    Total Protein 4.5 (*)    Albumin 2.4 (*)    All other components within normal limits  CBC WITH DIFFERENTIAL/PLATELET - Abnormal; Notable for the following components:   WBC 21.1 (*)    RBC 3.71 (*)    Hemoglobin 9.2 (*)    HCT 31.3 (*)    MCH 24.8 (*)    MCHC 29.4 (*)    RDW 22.6 (*)    Neutro Abs 16.5 (*)    Monocytes Absolute 2.7 (*)    Abs Immature Granulocytes 0.40 (*)    All other components within normal limits  URINALYSIS, ROUTINE W REFLEX MICROSCOPIC - Abnormal; Notable for the following components:   APPearance HAZY (*)    Ketones, ur 5 (*)    Leukocytes,Ua LARGE (*)    Bacteria, UA RARE (*)    Non Squamous Epithelial 0-5 (*)    All other components within normal limits  I-STAT CG4 LACTIC ACID, ED - Abnormal; Notable for the following components:   Lactic Acid, Venous 2.9 (*)    All other components within normal limits   I-STAT CHEM 8, ED - Abnormal; Notable for the following components:   Glucose, Bld 125 (*)    Calcium , Ion 0.99 (*)    Hemoglobin 9.5 (*)    HCT 28.0 (*)    All other components within normal limits  I-STAT VENOUS BLOOD GAS, ED - Abnormal; Notable for the following components:   pH, Ven 7.644 (*)    pCO2, Ven 21.0 (*)    pO2, Ven 110 (*)    Acid-Base Excess 3.0 (*)    Calcium , Ion 0.97 (*)    HCT 27.0 (*)    Hemoglobin 9.2 (*)    All other components within normal limits  TROPONIN I (HIGH SENSITIVITY) - Abnormal; Notable for the following components:   Troponin I (High Sensitivity) 38 (*)    All other components within normal limits  GASTROINTESTINAL PANEL BY PCR, STOOL (REPLACES STOOL CULTURE)  C DIFFICILE QUICK SCREEN W PCR REFLEX    LIPASE, BLOOD  POC OCCULT BLOOD, ED  I-STAT CG4 LACTIC ACID, ED  TYPE AND SCREEN  TROPONIN I (HIGH SENSITIVITY)    EKG: EKG Interpretation Date/Time:  Sunday June 13 2024 09:20:46 EDT Ventricular Rate:  94 PR Interval:  127 QRS Duration:  94 QT Interval:  385 QTC Calculation: 482 R Axis:   -55  Text Interpretation: Sinus rhythm Left anterior fascicular block Posterior infarct, acute (LCx) Anterior st depression ischemic change compared to previous Confirmed by Armenta Canning [phone]) on 06/13/2024 9:41:33 AM  Radiology: [facility] Chest Port 1 View Result Date: 06/13/2024 CLINICAL DATA:  Generalized weakness, hypotension EXAM: PORTABLE CHEST 1 VIEW COMPARISON:  06/06/2024 FINDINGS: Interval development of small left pleural effusion. There is marked gaseous distension of the esophagus in addition to a moderate hiatal hernia suggestive of a functional obstruction, better seen on CT examination of 06/08/2024. No pneumothorax. Cardiac size within normal limits. Pulmonary vascularity is normal. No acute bone abnormality. IMPRESSION: 1. Small left pleural effusion. 2. Marked gaseous distension of the esophagus in addition to a moderate hiatal hernia  suggestive of a functional obstruction. This would be better assessed with endoscopy Electronically Signed   By: Dorethia Molt M.D.   On: 06/13/2024 09:55     Procedures   Medications Ordered in the ED  cefTRIAXone  (ROCEPHIN ) 1 g in sodium chloride  0.9 % 100 mL IVPB (1 g Intravenous New Bag/Given 06/13/24 1501)  lactated ringers  bolus 1,000 mL (1,000 mLs Intravenous New Bag/Given 06/13/24 1215)  pantoprazole  (PROTONIX ) injection 40 mg (40 mg Intravenous Given 06/13/24 1303)                                    Medical Decision Making Amount and/or Complexity of Data Reviewed Labs: ordered. Radiology: ordered.  Risk Prescription drug management. Decision regarding hospitalization.  Patient recent NSTEMI.  She is presenting today with diarrheal illness and worsening weakness and hypotension.  Broad differential includes ACS\infectious illness was dehydration\metabolic derangement.  Patient's first EKG reviewed by myself concerning for ischemic appearance.  Will consult cardiology.  Consult: 09: 57 reviewed with Dr. Ladona cardiology.  We have reviewed the current EKG and at this time he does not feel that this represents STEMI.  Will proceed with diagnostic evaluation and workup.  Urinalysis positive for greater than 50 WBCs lactic 1 troponin 33 white count 21,000 hemoglobin 9  Patient has risk for significant ACS\cardiac dysfunction.  At this time troponin is likely trending down.  Will need to continue to observe.  Cardiology does not recommend immediate intervention.  Patient's urinalysis also positive with grossly elevated white blood cell count.  This may be accounting for symptoms of malaise and weakness.  Will plan for admission for treatment of UTI and continued monitoring  for cardiac status.  Consult: [facility] hospitalist Dr. Melvin for admission problem     Final diagnoses:  Acute cystitis without hematuria  Epigastric abdominal pain  General weakness    ED Discharge Orders      None          Armenta Canning, MD 06/19/24 1745

## 2024-06-13 NOTE — ED Triage Notes (Signed)
 Pt BIB [facility] from [facility] with reports of weakness and diarrhea. Pt initially hypotensive with EMS.

## 2024-06-13 NOTE — Plan of Care (Signed)

## 2024-06-13 NOTE — H&P (Signed)
 History and Physical   Kristine [name] FMW:[id number] DOB: Jun 12, 1941 DOA: 06/13/2024  PCP: Ileen Rosaline NOVAK, NP   Patient coming from: [facility]  Chief Complaint: Diarrhea, weakness, low blood pressure  HPI: Kristine [name] is a 83 y.o. female with medical history significant of hypertension, hyperlipidemia, GERD, CAD status post stent, CHF, OSA on CPAP, reactive airway disease, crest syndrome, achalasia presenting with diarrhea, weakness, low blood pressure, near syncope.  Patient was recently admitted 7/10-7/18 for NSTEMI Showed nonobstructive CAD.  Patient's admission was complicated by respiratory failure and imaging workup indicated persistent achalasia with risk for aspiration though SLP evaluation showed patient was safe to continue with regular, thin diet with precautions.  Patient's course was complicated by syncope and admission with benign workup.  Also noted to have iron  deficiency anemia and did receive a unit of transfusion.  Patient has been doing okay at facility but had to have increased weakness and low blood pressure with an episode of near syncope/near fall in the setting of diarrhea.  Patient has reportedly been getting stool softeners and laxatives that she may not need and has had several liquid stools per day that have been described as brown/green and slimy.  Patient continues to report persistent epigastric pain which is chronic in the setting of her achalasia and hiatal hernia.  She denies fevers, chills, constipation, nausea, vomiting.  ED Course: Vital signs in the ED notable for blood pressures in the 110s-120 systolic.  Lab workup in the ED notable for CMP with glucose 132, calcium  8.3, protein 4.5, albumin 2.4.  CBC with leukocytosis to 21.1 and hemoglobin stable at 9.2.  BNP mildly elevated at 546, lipase normal.  Troponin 38 with repeat pending.  FOBT negative.  Lactic acid 2.9 with repeat normal.  Urinalysis with leukocytes and bacteria.  VBG with alkalosis with  pH 7.64 and pCO2 21.  C. difficile and GI pathogen panels are pending.  Chest x-ray showed small left pleural effusion and significant gaseous distention of esophagus and hiatal hernia suggestive of functional obstruction better assessed with endoscopy however this is stable from prior CT in stable from prior based on read of that CT.  Patient received IV PPI, 1 L IV fluids, ceftriaxone  in the ED.   Review of Systems: As per HPI otherwise all other systems reviewed and are negative.  Past Medical History:  Diagnosis Date   Anemia    Arthritis    Asthma    Barrett's esophagus    Stage B   CAD (coronary artery disease)    a. s/p STEMI 7/14 >> LHC: LM 20, mLAD 30-40, small Dx 100 (L-L collats), OM1 90, OM2 30-40, mRCA 50-60, Lat/AL akinesis, EF 35-40% >> PCI: Promus DES to OM1;  b. Myoview 3/16: diaph atten, no ischemia, EF 59% (intermediate risk b/c BP drop 152>>137, but recovery BP 162 (tech error) >> med Rx continued  c. cath 05/30/17 s/p FFR guided PCI with DES to circumflex/first OM    COPD exacerbation (HCC) 06/04/2024   COVID-19 virus infection    CREST variant of scleroderma (HCC)    Cystocele    Diverticulitis    Event monitor 04/2019   Event monitor 04/2019: Sinus rhythm; no atrial fibrillation/flutter; no bradycardic events; few PVCs and occasional ventricular runs up to 9 beats   GERD (gastroesophageal reflux disease)    History of blood transfusion    hx of being Anti-K positive   History of Doppler ultrasound    a. Carotid US  2/12: bilateral ICA <  50%   Hyperlipidemia    Hypertension    Ischemic cardiomyopathy    a. EF 35-40% by LHC at time of MI;  b. Echo 7/14:  Mild focal basal septal hypertrophy, EF 50-55%, apical HK, grade 1 diastolic dysfunction, trivial AI    Ischemic colitis (HCC)    Orthostatic hypotension    OSA on CPAP    Osteoporosis 12/2017   T score -2.6   Raynaud disease    Rectocele    mild    Past Surgical History:  Procedure Laterality Date    ABDOMINAL HYSTERECTOMY  1996   RSO for menorrhagia.   CORONARY ANGIOPLASTY WITH STENT PLACEMENT  06/21/2013   CORONARY PRESSURE/FFR STUDY N/A 05/30/2017   Procedure: Intravascular Pressure Wire/FFR Study;  Surgeon: Wonda Sharper, MD;  Location: [facility] INVASIVE CV LAB;  Service: Cardiovascular;  Laterality: N/A;   CORONARY STENT INTERVENTION N/A 05/30/2017   Procedure: Coronary Stent Intervention;  Surgeon: Wonda Sharper, MD;  Location: [facility] INVASIVE CV LAB;  Service: Cardiovascular;  Laterality: N/A;   FOOT SURGERY  2013   x2   JOINT REPLACEMENT  9/10   rt total   LEFT HEART CATH AND CORONARY ANGIOGRAPHY N/A 05/30/2017   Procedure: Left Heart Cath and Coronary Angiography;  Surgeon: Wonda Sharper, MD;  Location: [facility] INVASIVE CV LAB;  Service: Cardiovascular;  Laterality: N/A;   LEFT HEART CATH AND CORONARY ANGIOGRAPHY N/A 06/07/2024   Procedure: LEFT HEART CATH AND CORONARY ANGIOGRAPHY;  Surgeon: Mady Bruckner, MD;  Location: [facility] INVASIVE CV LAB;  Service: Cardiovascular;  Laterality: N/A;   nissen fundoplication  2006   ROTATOR CUFF REPAIR     TONSILLECTOMY  1950   TOTAL KNEE ARTHROPLASTY     right   TUBAL LIGATION  1980    Social History  reports that she has never smoked. She has never used smokeless tobacco. She reports that she does not drink alcohol and does not use drugs.  Allergies  Allergen Reactions   Nsaids Other (See Comments)    Prone to bleeding   Aspirin  Other (See Comments)    Prone to bleeding OK to take low-dose EC but not aspirin  based medications.    Family History  Problem Relation Age of Onset   Cancer Mother        Pancreatic Cancer   Hepatitis C Mother    Heart disease Father        congestive heart failure   Heart attack Father    Panic disorder Daughter    Hypertension Daughter    Asthma Maternal Grandmother    Emphysema Maternal Grandmother    Cancer Maternal Grandfather        lung   Stroke Neg Hx   Reviewed on admission  Prior to Admission  medications   Medication Sig Start Date End Date Taking? Authorizing Provider  acetaminophen  (TYLENOL ) 500 MG tablet Take 1,000 mg by mouth every 6 (six) hours as needed for headache or moderate pain (pain score 4-6).    [provider]  albuterol  (VENTOLIN  HFA) 108 (90 Base) MCG/ACT inhaler 1 puff as needed Inhalation every 4 hrs 03/14/22   [provider]  aspirin  EC 81 MG EC tablet Take 1 tablet (81 mg total) by mouth daily. 06/24/13   Barrett, Shona MATSU, PA-C  b complex vitamins capsule Take 1 capsule by mouth daily.    [provider]  famotidine  (PEPCID ) 40 MG tablet Take 40 mg by mouth at bedtime.    [provider]  feeding  supplement (ENSURE PLUS HIGH PROTEIN) LIQD Take 237 mLs by mouth 2 (two) times daily between meals. 06/11/24   Ezenduka, Nkeiruka J, MD  ferrous sulfate  325 (65 FE) MG tablet Take 1 tablet (325 mg total) by mouth daily with breakfast. 06/11/24   Donnamarie Lebron PARAS, MD  fluticasone  furoate-vilanterol (BREO ELLIPTA ) 100-25 MCG/ACT AEPB Inhale 1 puff into the lungs daily. 06/12/24   Ezenduka, Nkeiruka J, MD  ipratropium-albuterol  (DUONEB) 0.5-2.5 (3) MG/3ML SOLN Take 3 mLs by nebulization every 6 (six) hours as needed. 06/11/24   Ezenduka, Nkeiruka J, MD  MAGNESIUM  PO Take 1 tablet by mouth daily.    [provider]  metoprolol  succinate (TOPROL  XL) 25 MG 24 hr tablet Take 0.5 tablets (12.5 mg total) by mouth daily. 07/30/23   Lelon Hamilton T, PA-C  nitroGLYCERIN  (NITROSTAT ) 0.4 MG SL tablet Place 1 tablet (0.4 mg total) under the tongue every 5 (five) minutes x 3 doses as needed for chest pain. 06/11/23   Weaver, Scott T, PA-C  OLIVE LEAF EXTRACT PO Take 1 capsule by mouth daily.    [provider]  pantoprazole  (PROTONIX ) 40 MG tablet Take 40 mg by mouth daily.    [provider]  pravastatin  (PRAVACHOL ) 40 MG tablet Take 1 tablet (40 mg total) by mouth every evening. 06/11/24   Ezenduka, Nkeiruka J, MD  QUERCETIN PO  Take 1 tablet by mouth daily.    [provider]  senna-docusate (SENOKOT-S) 8.6-50 MG tablet Take 1 tablet by mouth 2 (two) times daily. 06/11/24   Donnamarie Lebron PARAS, MD    Physical Exam: Vitals:   06/13/24 0925 06/13/24 1215 06/13/24 1345 06/13/24 1501  BP:  (!) 123/54 (!) 119/49   Pulse:   92   Resp:  18 18   Temp: 98.2 F (36.8 C)   98.4 F (36.9 C)  TempSrc:    Oral  SpO2:   98%     Physical Exam Constitutional:      General: She is not in acute distress.    Appearance: Normal appearance.  HENT:     Head: Normocephalic and atraumatic.     Mouth/Throat:     Mouth: Mucous membranes are moist.     Pharynx: Oropharynx is clear.  Eyes:     Extraocular Movements: Extraocular movements intact.     Pupils: Pupils are equal, round, and reactive to light.  Cardiovascular:     Rate and Rhythm: Normal rate and regular rhythm.     Pulses: Normal pulses.     Heart sounds: Normal heart sounds.  Pulmonary:     Effort: Pulmonary effort is normal. No respiratory distress.     Breath sounds: Normal breath sounds.  Abdominal:     General: Bowel sounds are normal. There is no distension.     Palpations: Abdomen is soft.     Tenderness: There is no abdominal tenderness.  Musculoskeletal:        General: No swelling or deformity.  Skin:    General: Skin is warm and dry.  Neurological:     General: No focal deficit present.     Mental Status: Mental status is at baseline.    Labs on Admission: I have personally reviewed following labs and imaging studies  CBC: Recent Labs  Lab 06/08/24 0453 06/09/24 0401 06/10/24 0440 06/10/24 1738 06/11/24 0331 06/13/24 0932 06/13/24 1006  WBC 19.1* 18.2* 10.9*  --  11.2* 21.1*  --   NEUTROABS  --   --  7.2  --   --  16.5*  --   HGB 8.8* 8.3* 6.9* 10.0* 9.3* 9.2* 9.5*  9.2*  HCT 30.5* 28.7* 23.1* 31.6* 29.6* 31.3* 28.0*  27.0*  MCV 76.8* 76.5* 76.5*  --  76.5* 84.4  --   PLT 228 235 189  --  168 255  --     Basic  Metabolic Panel: Recent Labs  Lab 06/06/24 2143 06/07/24 0513 06/08/24 0453 06/09/24 0401 06/10/24 0440 06/11/24 0331 06/13/24 0932 06/13/24 1006  NA 139   < > 142 136 134* 136 137 137  137  K 4.0   < > 3.3* 4.1 4.0 3.4* 4.0 4.0  4.0  CL 99   < > 104 102 104 106 105 105  CO2 31   < > 28 26 24 23 23   --   GLUCOSE 133*   < > 156* 128* 110* 100* 132* 125*  BUN 28*   < > 23 18 17 16 21 22   CREATININE 0.66   < > 0.64 0.57 0.53 0.65 0.61 0.60  CALCIUM  9.4   < > 9.2 8.5* 8.5* 8.2* 8.3*  --   MG 2.2  --  2.1  --   --  2.0  --   --    < > = values in this interval not displayed.    GFR: Estimated Creatinine Clearance: 39.6 mL/min (by C-G formula based on SCr of 0.6 mg/dL).  Liver Function Tests: Recent Labs  Lab 06/13/24 0932  AST 20  ALT 16  ALKPHOS 43  BILITOT 0.4  PROT 4.5*  ALBUMIN 2.4*    Urine analysis:    Component Value Date/Time   COLORURINE YELLOW 06/13/2024 1329   APPEARANCEUR HAZY (A) 06/13/2024 1329   LABSPEC 1.015 06/13/2024 1329   PHURINE 7.0 06/13/2024 1329   GLUCOSEU NEGATIVE 06/13/2024 1329   HGBUR NEGATIVE 06/13/2024 1329   BILIRUBINUR NEGATIVE 06/13/2024 1329   BILIRUBINUR negative 07/14/2021 1409   BILIRUBINUR neg 12/29/2014 1954   KETONESUR 5 (A) 06/13/2024 1329   PROTEINUR NEGATIVE 06/13/2024 1329   UROBILINOGEN 0.2 07/14/2021 1409   UROBILINOGEN 0.2 02/01/2013 1541   NITRITE NEGATIVE 06/13/2024 1329   LEUKOCYTESUR LARGE (A) 06/13/2024 1329    Radiological Exams on Admission: DG Chest Port 1 View Result Date: 06/13/2024 CLINICAL DATA:  Generalized weakness, hypotension EXAM: PORTABLE CHEST 1 VIEW COMPARISON:  06/06/2024 FINDINGS: Interval development of small left pleural effusion. There is marked gaseous distension of the esophagus in addition to a moderate hiatal hernia suggestive of a functional obstruction, better seen on CT examination of 06/08/2024. No pneumothorax. Cardiac size within normal limits. Pulmonary vascularity is normal. No  acute bone abnormality. IMPRESSION: 1. Small left pleural effusion. 2. Marked gaseous distension of the esophagus in addition to a moderate hiatal hernia suggestive of a functional obstruction. This would be better assessed with endoscopy Electronically Signed   By: Dorethia Molt M.D.   On: 06/13/2024 09:55   EKG: Independently reviewed.  Sinus rhythm at 94 bpm.  Nonspecific T wave changes and changes consistent with T wave depression..  Baseline wander.  EDP reviewed EKG with cardiology on-call who indicated this was not a STEMI.  Troponin only mildly elevated at 38 with repeat pending.  Assessment/Plan Principal Problem:   Diarrhea Active Problems:   GERD (gastroesophageal reflux disease)   Hypertension   CAD (coronary artery disease), native coronary artery   Hyperlipidemia   OSA on CPAP   Cr(e)st syndrome (HCC)   S/P coronary artery stent placement   Mild intermittent reactive  airway disease   UTI (urinary tract infection)   Weakness   Hypotension   Near syncope   Diarrhea Weakness Low blood pressure Near syncope > Patient presenting after patient was noted to have diarrhea, weakness, low blood pressure and near fall/near syncope at facility. > Reports she has been given softeners and laxatives that she may not need and has had several liquid bowel movements today.  Reported to be brown/green and slimy in consistency. > Electrolytes are stable and creatinine is at baseline in ED.  Does have leukocytosis to 21.1 though possible UTI as below. > GI pathogen panel and C. difficile labs have been ordered in the ED and are pending. > Blood pressure has improved with 1 L IV fluids in the ED. - Monitor on telemetry overnight - Continue with IV fluids - Follow-up C. difficile and GI pathogen panel - Trend fever curve and WBC - Supportive care  ? UTI > Further workup was done given leukocytosis to 21.1 and possible that diarrhea was due to laxatives and stool softeners. > Urinalysis  showed leukocytes and bacteria and patient has been covered with ceftriaxone . - Will continue ceftriaxone  for now and check urine culture  Hypertension - Continue metoprolol  tomorrow  Hyperlipidemia - Continue pravastatin   GERD - Continue home Pepcid  and PPI  CAD > Status post stent > Recent admission for NSTEMI with nonobstructive CAD on cath - Continue home ASA, pravastatin , metoprolol   Reactive airway disease - Continue home Breo and as needed albuterol   OSA - Continue CPAP  Achalasia > High risk for aspiration, no evidence of aspiration on CT.  Cleared for regular diet with thin liquids by SLP during recent admission on 7/17. - Noted, follow-up outpatient  Anemia > Diagnosed with iron  deficiency anemia last admission.  Did receive a transfusion.  Hemoglobin stable at 9.2.  FOBT negative. - Trend CBC  DVT prophylaxis: Lovenox  Code Status:   Full Family Communication:  None on admission  Disposition Plan:   Patient is from:  Home  Anticipated DC to:  Home  Anticipated DC date:  1 to 2 days  Anticipated DC barriers: None  Consults called:  None Admission status:  Observation, telemetry  Severity of Illness: The appropriate patient status for this patient is OBSERVATION. Observation status is judged to be reasonable and necessary in order to provide the required intensity of service to ensure the patient's safety. The patient's presenting symptoms, physical exam findings, and initial radiographic and laboratory data in the context of their medical condition is felt to place them at decreased risk for further clinical deterioration. Furthermore, it is anticipated that the patient will be medically stable for discharge from the hospital within 2 midnights of admission.    Marsa KATHEE Scurry MD [facility]  How to contact the [facility] Attending or Consulting provider 7A - 7P or covering provider during after hours 7P -7A, for this patient?   Check the care team in  [facility] and look for a) attending/consulting [facility] provider listed and b) the [facility] team listed Log into [email or web address] and use [facility] universal password to access. If you do not have the password, please contact the hospital operator. Locate the [facility] provider you are looking for under [facility] and page to a number that you can be directly reached. If you still have difficulty reaching the provider, please page the [facility] (Director on Call) for the Hospitalists listed on amion for assistance.  06/13/2024, 3:58 PM

## 2024-06-14 DIAGNOSIS — Z955 Presence of coronary angioplasty implant and graft: Secondary | ICD-10-CM | POA: Diagnosis not present

## 2024-06-14 DIAGNOSIS — I214 Non-ST elevation (NSTEMI) myocardial infarction: Secondary | ICD-10-CM | POA: Diagnosis present

## 2024-06-14 DIAGNOSIS — K2970 Gastritis, unspecified, without bleeding: Secondary | ICD-10-CM | POA: Diagnosis present

## 2024-06-14 DIAGNOSIS — E876 Hypokalemia: Secondary | ICD-10-CM | POA: Diagnosis present

## 2024-06-14 DIAGNOSIS — I2510 Atherosclerotic heart disease of native coronary artery without angina pectoris: Secondary | ICD-10-CM | POA: Diagnosis present

## 2024-06-14 DIAGNOSIS — Z8249 Family history of ischemic heart disease and other diseases of the circulatory system: Secondary | ICD-10-CM | POA: Diagnosis not present

## 2024-06-14 DIAGNOSIS — D62 Acute posthemorrhagic anemia: Secondary | ICD-10-CM | POA: Diagnosis present

## 2024-06-14 DIAGNOSIS — Z538 Procedure and treatment not carried out for other reasons: Secondary | ICD-10-CM | POA: Diagnosis present

## 2024-06-14 DIAGNOSIS — R197 Diarrhea, unspecified: Secondary | ICD-10-CM | POA: Diagnosis present

## 2024-06-14 DIAGNOSIS — M341 CR(E)ST syndrome: Secondary | ICD-10-CM | POA: Diagnosis present

## 2024-06-14 DIAGNOSIS — K2289 Other specified disease of esophagus: Secondary | ICD-10-CM | POA: Diagnosis not present

## 2024-06-14 DIAGNOSIS — I9589 Other hypotension: Secondary | ICD-10-CM | POA: Diagnosis present

## 2024-06-14 DIAGNOSIS — Z8616 Personal history of COVID-19: Secondary | ICD-10-CM | POA: Diagnosis not present

## 2024-06-14 DIAGNOSIS — K2210 Ulcer of esophagus without bleeding: Secondary | ICD-10-CM | POA: Diagnosis not present

## 2024-06-14 DIAGNOSIS — M810 Age-related osteoporosis without current pathological fracture: Secondary | ICD-10-CM | POA: Diagnosis present

## 2024-06-14 DIAGNOSIS — K220 Achalasia of cardia: Secondary | ICD-10-CM | POA: Diagnosis present

## 2024-06-14 DIAGNOSIS — K2950 Unspecified chronic gastritis without bleeding: Secondary | ICD-10-CM | POA: Diagnosis not present

## 2024-06-14 DIAGNOSIS — D509 Iron deficiency anemia, unspecified: Secondary | ICD-10-CM | POA: Diagnosis not present

## 2024-06-14 DIAGNOSIS — K219 Gastro-esophageal reflux disease without esophagitis: Secondary | ICD-10-CM | POA: Diagnosis present

## 2024-06-14 DIAGNOSIS — K449 Diaphragmatic hernia without obstruction or gangrene: Secondary | ICD-10-CM | POA: Diagnosis present

## 2024-06-14 DIAGNOSIS — I10 Essential (primary) hypertension: Secondary | ICD-10-CM | POA: Diagnosis present

## 2024-06-14 DIAGNOSIS — K2211 Ulcer of esophagus with bleeding: Secondary | ICD-10-CM | POA: Diagnosis present

## 2024-06-14 DIAGNOSIS — A09 Infectious gastroenteritis and colitis, unspecified: Secondary | ICD-10-CM | POA: Diagnosis not present

## 2024-06-14 DIAGNOSIS — G4733 Obstructive sleep apnea (adult) (pediatric): Secondary | ICD-10-CM | POA: Diagnosis present

## 2024-06-14 DIAGNOSIS — J4520 Mild intermittent asthma, uncomplicated: Secondary | ICD-10-CM | POA: Diagnosis present

## 2024-06-14 DIAGNOSIS — E785 Hyperlipidemia, unspecified: Secondary | ICD-10-CM | POA: Diagnosis present

## 2024-06-14 DIAGNOSIS — I252 Old myocardial infarction: Secondary | ICD-10-CM | POA: Diagnosis not present

## 2024-06-14 DIAGNOSIS — I255 Ischemic cardiomyopathy: Secondary | ICD-10-CM | POA: Diagnosis present

## 2024-06-14 DIAGNOSIS — E873 Alkalosis: Secondary | ICD-10-CM | POA: Diagnosis present

## 2024-06-14 LAB — CBC
HCT: 22.2 % — ABNORMAL LOW (ref 36.0–46.0)
Hemoglobin: 6.7 g/dL — CL (ref 12.0–15.0)
MCH: 24.8 pg — ABNORMAL LOW (ref 26.0–34.0)
MCHC: 30.2 g/dL (ref 30.0–36.0)
MCV: 82.2 fL (ref 80.0–100.0)
Platelets: 213 K/uL (ref 150–400)
RBC: 2.7 MIL/uL — ABNORMAL LOW (ref 3.87–5.11)
RDW: 22.8 % — ABNORMAL HIGH (ref 11.5–15.5)
WBC: 15.5 K/uL — ABNORMAL HIGH (ref 4.0–10.5)
nRBC: 0 % (ref 0.0–0.2)

## 2024-06-14 LAB — COMPREHENSIVE METABOLIC PANEL WITH GFR
ALT: 12 U/L (ref 0–44)
AST: 13 U/L — ABNORMAL LOW (ref 15–41)
Albumin: 2 g/dL — ABNORMAL LOW (ref 3.5–5.0)
Alkaline Phosphatase: 34 U/L — ABNORMAL LOW (ref 38–126)
Anion gap: 3 — ABNORMAL LOW (ref 5–15)
BUN: 25 mg/dL — ABNORMAL HIGH (ref 8–23)
CO2: 23 mmol/L (ref 22–32)
Calcium: 7.8 mg/dL — ABNORMAL LOW (ref 8.9–10.3)
Chloride: 108 mmol/L (ref 98–111)
Creatinine, Ser: 0.65 mg/dL (ref 0.44–1.00)
GFR, Estimated: >60 mL/min (ref >60)
Glucose, Bld: 89 mg/dL (ref 70–99)
Potassium: 3.9 mmol/L (ref 3.5–5.1)
Sodium: 134 mmol/L — ABNORMAL LOW (ref 135–145)
Total Bilirubin: 0.5 mg/dL (ref 0.0–1.2)
Total Protein: 3.9 g/dL — ABNORMAL LOW (ref 6.5–8.1)

## 2024-06-14 LAB — PREPARE RBC (CROSSMATCH)

## 2024-06-14 LAB — HEMOGLOBIN AND HEMATOCRIT, BLOOD
HCT: 26.4 % — ABNORMAL LOW (ref 36.0–46.0)
Hemoglobin: 8.5 g/dL — ABNORMAL LOW (ref 12.0–15.0)

## 2024-06-14 MED ORDER — SODIUM CHLORIDE 0.9% IV SOLUTION: Freq: Once | INTRAVENOUS | Status: AC

## 2024-06-14 MED ORDER — SODIUM CHLORIDE 0.9 % IV SOLN
400.0000 mg | Freq: Once | INTRAVENOUS | Status: AC
  Administered 2024-06-14: 400 mg via INTRAVENOUS
  Filled 2024-06-14: qty 20

## 2024-06-14 MED ORDER — HYDRALAZINE HCL 20 MG/ML IJ SOLN: 10.0000 mg | INTRAMUSCULAR | Status: DC | PRN

## 2024-06-14 MED ORDER — ONDANSETRON HCL 4 MG/2ML IJ SOLN
4.0000 mg | Freq: Four times a day (QID) | INTRAMUSCULAR | Status: DC | PRN
  Administered 2024-06-14: 4 mg via INTRAVENOUS
  Filled 2024-06-14: qty 2

## 2024-06-14 MED ORDER — IPRATROPIUM-ALBUTEROL 0.5-2.5 (3) MG/3ML IN SOLN: 3.0000 mL | RESPIRATORY_TRACT | Status: DC | PRN

## 2024-06-14 MED ORDER — ALUM & MAG HYDROXIDE-SIMETH 200-200-20 MG/5ML PO SUSP
30.0000 mL | Freq: Four times a day (QID) | ORAL | Status: DC | PRN
  Administered 2024-06-14: 30 mL via ORAL
  Filled 2024-06-14: qty 30

## 2024-06-14 MED ORDER — SENNOSIDES-DOCUSATE SODIUM 8.6-50 MG PO TABS: 1.0000 | ORAL_TABLET | Freq: Every evening | ORAL | Status: DC | PRN

## 2024-06-14 MED ORDER — PANTOPRAZOLE SODIUM 40 MG PO TBEC: 40.0000 mg | DELAYED_RELEASE_TABLET | Freq: Every day | ORAL | Status: DC

## 2024-06-14 MED ORDER — PANTOPRAZOLE SODIUM 40 MG PO TBEC
40.0000 mg | DELAYED_RELEASE_TABLET | Freq: Two times a day (BID) | ORAL | Status: DC
  Administered 2024-06-14: 40 mg via ORAL
  Filled 2024-06-14: qty 1

## 2024-06-14 MED ORDER — METOPROLOL TARTRATE 5 MG/5ML IV SOLN: 5.0000 mg | INTRAVENOUS | Status: DC | PRN

## 2024-06-14 MED ORDER — SODIUM CHLORIDE 0.9 % IV SOLN: INTRAVENOUS | Status: AC

## 2024-06-14 NOTE — Plan of Care (Signed)
 Hemoglobin has been dropped 9.3-6.7. Patient does not have any evidence of bleeding.  Hemodynamically stable. Transfusing 1 unit of blood and need to follow-up with repeat H&H posttransfusion.  Discontinued Lovenox  continue SCD TED hose.  Merridy Pascoe, MD [facility] 06/14/2024, 4:57 AM

## 2024-06-14 NOTE — Care Management Obs Status (Signed)
 MEDICARE OBSERVATION STATUS NOTIFICATION   Patient Details  Name: Kristine [name] MRN: [id number] Date of Birth: 1941-11-01   Medicare Observation Status Notification Given:  Yes  Spoke with Daughter [place] patient daughter.   Korayma Hagwood 06/14/2024, 10:43 AM

## 2024-06-14 NOTE — Hospital Course (Addendum)
 Brief Narrative:  83 year old with history of HTN, HLD, GERD, CAD s/p stent, CHF, OSA on CPAP, reactive airway disease, crest syndrome, achalasia admitted for diarrhea, near syncope, low blood pressure and weakness.  Patient admitted to the hospital from 7/10 - 7/18 for NSTEMI showed nonobstructive CAD complicated by respiratory failure and workup revealed persistent achalasia but eventually resumed regular diet with precautions.  Patient was eventually discharged to her facility but now coming in with hypotension, weakness and presyncope.  Upon admission noted to have metabolic alkalosis.  Upon admission she was hydrated, received PRBC transfusion and IV iron .  GI consulted for endoscopy/colonoscopy   Assessment & Plan:  Principal Problem:   Diarrhea Active Problems:   GERD (gastroesophageal reflux disease)   Hypertension   CAD (coronary artery disease), native coronary artery   Hyperlipidemia   OSA on CPAP   Cr(e)st syndrome (HCC)   S/P coronary artery stent placement   Mild intermittent reactive airway disease   UTI (urinary tract infection)   Weakness   Hypotension   Near syncope     Presyncope/hypotension Diarrhea -Unclear etiology.  Will give IV fluids at this time.  If starts having diarrhea, check GI panel and C. difficile study - Reports she had diarrhea because her facility was given excessive amount of bowel regimen.  Achalasia with significant esophageal dilation CREST syndrome -Seen on chest x-ray.  Discussed with patient, she would prefer following up outpatient with her primary gastroenterology.  She has an appointment coming up in the next 2-3 weeks.  At this time I have advised that she needs to probably have modified diet between regular and liquid diet to avoid worsening of esophageal dilation.  She understands.  EGD May 2025 at [facility] hiatal hernia, significant esophageal dysmotility.  Abundant amount of liquid was suctioned and significant amount of food was  noted to be adherent to esophageal wall.  There was evidence of gastritis.  This is secondary to her crest  Acute anemia, severe iron  deficiency - Iron  studies 06/10/2024 showed severe iron  deficiency.  Baseline hemoglobin around 9.0, following.  Hemoglobin 6.7 given 1 unit PRBC transfusion and IV iron .  At first hemoglobin improved 8.5, now 7.5.  No obvious source of bleeding.  - GI consulted for endoscopy/colonoscopy  Urinary tract infection -Urine culture sent.  Empiric IV Rocephin    Hypertension Toprol -XL.  IV as needed   Hyperlipidemia -Pravastatin    GERD - Continue home Pepcid  and PPI   Coronary artery disease -LHC on 06/07/2024 showed stable coronaries compared to prior cath in 2018.  Nonobstructive CAD and widely patent prior stents.   Reactive airway disease Bronchodilators   OSA - Continue CPAP     DVT prophylaxis:      SCD Code Status:              Full Family Communication:   Hopefully hemoglobin remained stable and we can discharge her tomorrow   Subjective: Seen at bedside, denying any nausea and vomiting.   Examination:  General exam: Appears calm and comfortable  Respiratory system: Clear to auscultation. Respiratory effort normal. Cardiovascular system: S1 & S2 heard, RRR. No JVD, murmurs, rubs, gallops or clicks. No pedal edema. Gastrointestinal system: Abdomen is nondistended, soft and nontender. No organomegaly or masses felt. Normal bowel sounds heard. Central nervous system: Alert and oriented. No focal neurological deficits. Extremities: Symmetric 5 x 5 power. Skin: No rashes, lesions or ulcers Psychiatry: Judgement and insight appear normal. Mood & affect appropriate.

## 2024-06-14 NOTE — Progress Notes (Signed)
 PROGRESS NOTE    Kristine [name]  FMW:[id number] DOB: 08-24-1941 DOA: 06/13/2024 PCP: Ileen Rosaline NOVAK, NP    Brief Narrative:  83 year old with history of HTN, HLD, GERD, CAD s/p stent, CHF, OSA on CPAP, reactive airway disease, crest syndrome, achalasia admitted for diarrhea, near syncope, low blood pressure and weakness.  Patient admitted to the hospital from 7/10 - 7/18 for NSTEMI showed nonobstructive CAD complicated by respiratory failure and workup revealed persistent achalasia but eventually resumed regular diet with precautions.  Patient was eventually discharged to her facility but now coming in with hypotension, weakness and presyncope.  Upon admission noted to have metabolic alkalosis   Assessment & Plan:  Principal Problem:   Diarrhea Active Problems:   GERD (gastroesophageal reflux disease)   Hypertension   CAD (coronary artery disease), native coronary artery   Hyperlipidemia   OSA on CPAP   Cr(e)st syndrome (HCC)   S/P coronary artery stent placement   Mild intermittent reactive airway disease   UTI (urinary tract infection)   Weakness   Hypotension   Near syncope     Presyncope/hypotension Diarrhea -Unclear etiology.  Will give IV fluids at this time.  If starts having diarrhea, check GI panel and C. difficile study - Reports she had diarrhea because her facility was given excessive amount of bowel regimen.  Achalasia with significant esophageal dilation CREST syndrome -Seen on chest x-ray.  Discussed with patient, she would prefer following up outpatient with her primary gastroenterology.  She has an appointment coming up in the next 2-3 weeks.  At this time I have advised that she needs to probably have modified diet between regular and liquid diet to avoid worsening of esophageal dilation.  She understands.  EGD May 2025 at [facility] hiatal hernia, significant esophageal dysmotility.  Abundant amount of liquid was suctioned and significant amount of food  was noted to be adherent to esophageal wall.  There was evidence of gastritis.  This is secondary to her crest  Acute anemia, severe iron  deficiency - Iron  studies 06/10/2024 showed severe iron  deficiency.  Baseline hemoglobin around 9.0, this morning after hydration 6.7.  No obvious signs of bleeding.  Will go ahead and order IV iron , PRBC transfusion as well. -In terms of further endoscopic evaluation, patient prefers this to be done as outpatient with her primary gastroenterology.  Urinary tract infection -Urine culture sent.  Empiric IV Rocephin    Hypertension Toprol -XL.  IV as needed   Hyperlipidemia -Pravastatin    GERD - Continue home Pepcid  and PPI   Coronary artery disease -LHC on 06/07/2024 showed stable coronaries compared to prior cath in 2018.  Nonobstructive CAD and widely patent prior stents.   Reactive airway disease Bronchodilators   OSA - Continue CPAP     DVT prophylaxis:      SCD Code Status:              Full Family Communication:   Hopefully hemoglobin remained stable and we can discharge her tomorrow   Subjective: Seen at bedside, denying any nausea and vomiting.   Examination:  General exam: Appears calm and comfortable  Respiratory system: Clear to auscultation. Respiratory effort normal. Cardiovascular system: S1 & S2 heard, RRR. No JVD, murmurs, rubs, gallops or clicks. No pedal edema. Gastrointestinal system: Abdomen is nondistended, soft and nontender. No organomegaly or masses felt. Normal bowel sounds heard. Central nervous system: Alert and oriented. No focal neurological deficits. Extremities: Symmetric 5 x 5 power. Skin: No rashes, lesions or ulcers Psychiatry: Judgement and insight  appear normal. Mood & affect appropriate.                Diet Orders (From admission, onward)     Start     Ordered   06/14/24 1118  Diet clear liquid Room service appropriate? Yes; Fluid consistency: Thin  Diet effective now       Question  Answer Comment  Room service appropriate? Yes   Fluid consistency: Thin      06/14/24 1117            Objective: Vitals:   06/14/24 0745 06/14/24 0838 06/14/24 0922 06/14/24 1106  BP: (!) 110/55  (!) 111/50 (!) (P) 103/47  Pulse: (!) 102  89 (P) 93  Resp:   18   Temp: 98.5 F (36.9 C)  98.3 F (36.8 C)   TempSrc: Oral  Oral   SpO2: 96% 97%  (P) 97%    Intake/Output Summary (Last 24 hours) at 06/14/2024 1119 Last data filed at 06/14/2024 9077 Gross per 24 hour  Intake 381.5 ml  Output --  Net 381.5 ml   There were no vitals filed for this visit.  Scheduled Meds:  aspirin  EC  81 mg Oral Daily   famotidine   40 mg Oral QHS   feeding supplement  237 mL Oral BID BM   fluticasone  furoate-vilanterol  1 puff Inhalation Daily   metoprolol  succinate  12.5 mg Oral Daily   pantoprazole   40 mg Oral Daily   pravastatin   40 mg Oral QPM   sodium chloride  flush  3 mL Intravenous Q12H   Continuous Infusions:  sodium chloride  75 mL/hr at 06/14/24 0916   cefTRIAXone  (ROCEPHIN )  IV     iron  sucrose 400 mg (06/14/24 0923)    Nutritional status     There is no height or weight on file to calculate BMI.  Data Reviewed:   CBC: Recent Labs  Lab 06/09/24 0401 06/10/24 0440 06/10/24 1738 06/11/24 0331 06/13/24 0932 06/13/24 1006 06/14/24 0324  WBC 18.2* 10.9*  --  11.2* 21.1*  --  15.5*  NEUTROABS  --  7.2  --   --  16.5*  --   --   HGB 8.3* 6.9* 10.0* 9.3* 9.2* 9.5*  9.2* 6.7*  HCT 28.7* 23.1* 31.6* 29.6* 31.3* 28.0*  27.0* 22.2*  MCV 76.5* 76.5*  --  76.5* 84.4  --  82.2  PLT 235 189  --  168 255  --  213   Basic Metabolic Panel: Recent Labs  Lab 06/08/24 0453 06/09/24 0401 06/10/24 0440 06/11/24 0331 06/13/24 0932 06/13/24 1006 06/14/24 0324  NA 142 136 134* 136 137 137  137 134*  K 3.3* 4.1 4.0 3.4* 4.0 4.0  4.0 3.9  CL 104 102 104 106 105 105 108  CO2 28 26 24 23 23   --  23  GLUCOSE 156* 128* 110* 100* 132* 125* 89  BUN 23 18 17 16 21 22  25*   CREATININE 0.64 0.57 0.53 0.65 0.61 0.60 0.65  CALCIUM  9.2 8.5* 8.5* 8.2* 8.3*  --  7.8*  MG 2.1  --   --  2.0  --   --   --    GFR: Estimated Creatinine Clearance: 39.6 mL/min (by C-G formula based on SCr of 0.65 mg/dL). Liver Function Tests: Recent Labs  Lab 06/13/24 0932 06/14/24 0324  AST 20 13*  ALT 16 12  ALKPHOS 43 34*  BILITOT 0.4 0.5  PROT 4.5* 3.9*  ALBUMIN 2.4* 2.0*   Recent Labs  Lab 06/13/24 0932  LIPASE 40   No results for input(s): AMMONIA in the last 168 hours. Coagulation Profile: No results for input(s): INR, PROTIME in the last 168 hours. Cardiac Enzymes: No results for input(s): CKTOTAL, CKMB, CKMBINDEX, TROPONINI in the last 168 hours. BNP (last 3 results) No results for input(s): PROBNP in the last 8760 hours. HbA1C: No results for input(s): HGBA1C in the last 72 hours. CBG: No results for input(s): GLUCAP in the last 168 hours. Lipid Profile: No results for input(s): CHOL, HDL, LDLCALC, TRIG, CHOLHDL, LDLDIRECT in the last 72 hours. Thyroid  Function Tests: No results for input(s): TSH, T4TOTAL, FREET4, T3FREE, THYROIDAB in the last 72 hours. Anemia Panel: No results for input(s): VITAMINB12, FOLATE, FERRITIN, TIBC, IRON , RETICCTPCT in the last 72 hours. Sepsis Labs: Recent Labs  Lab 06/13/24 1006 06/13/24 1328  LATICACIDVEN 2.9* 1.0    No results found for this or any previous visit (from the past 240 hours).       Radiology Studies: DG Chest Port 1 View Result Date: 06/13/2024 CLINICAL DATA:  Generalized weakness, hypotension EXAM: PORTABLE CHEST 1 VIEW COMPARISON:  06/06/2024 FINDINGS: Interval development of small left pleural effusion. There is marked gaseous distension of the esophagus in addition to a moderate hiatal hernia suggestive of a functional obstruction, better seen on CT examination of 06/08/2024. No pneumothorax. Cardiac size within normal limits. Pulmonary  vascularity is normal. No acute bone abnormality. IMPRESSION: 1. Small left pleural effusion. 2. Marked gaseous distension of the esophagus in addition to a moderate hiatal hernia suggestive of a functional obstruction. This would be better assessed with endoscopy Electronically Signed   By: Dorethia Molt M.D.   On: 06/13/2024 09:55           LOS: 0 days   Time spent= 35 mins    Burgess JAYSON Dare, MD [facility]  If 7PM-7AM, please contact night-coverage  06/14/2024, 11:19 AM

## 2024-06-14 NOTE — Plan of Care (Signed)
   Problem: Education: Goal: Knowledge of General Education information will improve Description: Including pain rating scale, medication(s)/side effects and non-pharmacologic comfort measures Outcome: Progressing   Problem: Health Behavior/Discharge Planning: Goal: Ability to manage health-related needs will improve Outcome: Progressing   Problem: Nutrition: Goal: Adequate nutrition will be maintained Outcome: Progressing

## 2024-06-15 DIAGNOSIS — A09 Infectious gastroenteritis and colitis, unspecified: Secondary | ICD-10-CM

## 2024-06-15 DIAGNOSIS — D500 Iron deficiency anemia secondary to blood loss (chronic): Secondary | ICD-10-CM

## 2024-06-15 DIAGNOSIS — R1013 Epigastric pain: Secondary | ICD-10-CM

## 2024-06-15 DIAGNOSIS — D509 Iron deficiency anemia, unspecified: Secondary | ICD-10-CM

## 2024-06-15 LAB — BASIC METABOLIC PANEL WITH GFR
Anion gap: 6 (ref 5–15)
Anion gap: 7 (ref 5–15)
BUN: 16 mg/dL (ref 8–23)
BUN: 23 mg/dL (ref 8–23)
CO2: 22 mmol/L (ref 22–32)
CO2: 21 mmol/L — ABNORMAL LOW (ref 22–32)
Calcium: 8 mg/dL — ABNORMAL LOW (ref 8.9–10.3)
Calcium: 7.2 mg/dL — ABNORMAL LOW (ref 8.9–10.3)
Chloride: 106 mmol/L (ref 98–111)
Chloride: 111 mmol/L (ref 98–111)
Creatinine, Ser: 0.61 mg/dL (ref 0.44–1.00)
Creatinine, Ser: 0.56 mg/dL (ref 0.44–1.00)
GFR, Estimated: >60 mL/min (ref >60)
GFR, Estimated: >60 mL/min (ref >60)
Glucose, Bld: 117 mg/dL — ABNORMAL HIGH (ref 70–99)
Glucose, Bld: 117 mg/dL — ABNORMAL HIGH (ref 70–99)
Potassium: 3.6 mmol/L (ref 3.5–5.1)
Potassium: 4 mmol/L (ref 3.5–5.1)
Sodium: 134 mmol/L — ABNORMAL LOW (ref 135–145)
Sodium: 139 mmol/L (ref 135–145)

## 2024-06-15 LAB — CBC
HCT: 24.1 % — ABNORMAL LOW (ref 36.0–46.0)
Hemoglobin: 7.5 g/dL — ABNORMAL LOW (ref 12.0–15.0)
MCH: 26.7 pg (ref 26.0–34.0)
MCHC: 31.1 g/dL (ref 30.0–36.0)
MCV: 85.8 fL (ref 80.0–100.0)
Platelets: 206 K/uL (ref 150–400)
RBC: 2.81 MIL/uL — ABNORMAL LOW (ref 3.87–5.11)
RDW: 22.2 % — ABNORMAL HIGH (ref 11.5–15.5)
WBC: 16.3 K/uL — ABNORMAL HIGH (ref 4.0–10.5)
nRBC: 0.1 % (ref 0.0–0.2)

## 2024-06-15 LAB — URINE CULTURE: Culture: <10000 — AB

## 2024-06-15 LAB — HEMOGLOBIN AND HEMATOCRIT, BLOOD
HCT: 18.4 % — ABNORMAL LOW (ref 36.0–46.0)
HCT: 19.8 % — ABNORMAL LOW (ref 36.0–46.0)
Hemoglobin: 5.7 g/dL — CL (ref 12.0–15.0)
Hemoglobin: 5.9 g/dL — CL (ref 12.0–15.0)

## 2024-06-15 LAB — MAGNESIUM: Magnesium: 1.9 mg/dL (ref 1.7–2.4)

## 2024-06-15 LAB — GLUCOSE, CAPILLARY: Glucose-Capillary: 119 mg/dL — ABNORMAL HIGH (ref 70–99)

## 2024-06-15 LAB — PREPARE RBC (CROSSMATCH)

## 2024-06-15 MED ORDER — SODIUM CHLORIDE 0.9 % IV SOLN: INTRAVENOUS | Status: AC

## 2024-06-15 MED ORDER — POTASSIUM CHLORIDE 10 MEQ/100ML IV SOLN
10.0000 meq | INTRAVENOUS | Status: AC
  Administered 2024-06-15 (×2): 10 meq via INTRAVENOUS
  Filled 2024-06-15: qty 100

## 2024-06-15 MED ORDER — METOPROLOL TARTRATE 5 MG/5ML IV SOLN: 2.5000 mg | INTRAVENOUS | Status: DC | PRN

## 2024-06-15 MED ORDER — LACTATED RINGERS IV BOLUS: 1000.0000 mL | Freq: Once | INTRAVENOUS | Status: DC

## 2024-06-15 MED ORDER — NA SULFATE-K SULFATE-MG SULF 17.5-3.13-1.6 GM/177ML PO SOLN
0.5000 | Freq: Once | ORAL | Status: AC
  Administered 2024-06-15: 177 mL via ORAL

## 2024-06-15 MED ORDER — PANTOPRAZOLE SODIUM 40 MG PO TBEC
40.0000 mg | DELAYED_RELEASE_TABLET | Freq: Two times a day (BID) | ORAL | Status: DC
  Administered 2024-06-15 (×2): 40 mg via ORAL
  Filled 2024-06-15 (×2): qty 1

## 2024-06-15 MED ORDER — POTASSIUM CHLORIDE 20 MEQ PO PACK
20.0000 meq | PACK | Freq: Once | ORAL | Status: AC
  Administered 2024-06-15: 20 meq via ORAL
  Filled 2024-06-15: qty 1

## 2024-06-15 MED ORDER — SIMETHICONE 80 MG PO CHEW
240.0000 mg | CHEWABLE_TABLET | Freq: Once | ORAL | Status: AC
  Administered 2024-06-15: 240 mg via ORAL

## 2024-06-15 MED ORDER — LACTATED RINGERS IV SOLN: INTRAVENOUS | Status: DC

## 2024-06-15 MED ORDER — SODIUM CHLORIDE 0.9% IV SOLUTION
Freq: Once | INTRAVENOUS | Status: AC
Start: 2024-06-15 — End: 2024-06-16

## 2024-06-15 MED ORDER — PANTOPRAZOLE SODIUM 40 MG IV SOLR
40.0000 mg | Freq: Two times a day (BID) | INTRAVENOUS | Status: DC
  Administered 2024-06-15 – 2024-06-17 (×4): 40 mg via INTRAVENOUS
  Filled 2024-06-15 (×4): qty 10

## 2024-06-15 MED ORDER — SODIUM CHLORIDE 0.9 % IV BOLUS
1000.0000 mL | INTRAVENOUS | Status: AC
  Administered 2024-06-15: 1000 mL via INTRAVENOUS

## 2024-06-15 MED ORDER — SIMETHICONE 80 MG PO CHEW
240.0000 mg | CHEWABLE_TABLET | Freq: Once | ORAL | Status: AC
  Administered 2024-06-15: 240 mg via ORAL
  Filled 2024-06-15: qty 3

## 2024-06-15 MED ORDER — NA SULFATE-K SULFATE-MG SULF 17.5-3.13-1.6 GM/177ML PO SOLN
0.5000 | Freq: Once | ORAL | Status: AC
  Administered 2024-06-15: 177 mL via ORAL
  Filled 2024-06-15: qty 1

## 2024-06-15 MED ORDER — SODIUM CHLORIDE 0.9 % IV BOLUS: 1000.0000 mL | Freq: Once | INTRAVENOUS | Status: DC

## 2024-06-15 NOTE — Evaluation (Signed)
 Physical Therapy Evaluation Patient Details Name: Kristine [name] MRN: [id number] DOB: 1941/03/29 Today's Date: 06/15/2024  History of Present Illness  Pt is an 83 y.o. female admitted 7/20 from [facility] for diarrhea, weakness, and low BP. Recent hospitalization 7/10-7/18 for NSTEMI. PMH: CREST syndrome, CAD, STEMI 2014, HLD, HTN, ischemic colitis, Barrett's esophagus, OSA on CPAP, OA   Clinical Impression  Pt admitted with above diagnosis. Prior to previous July 2025 admission, pt lived alone, mod I with SPC. Pt currently with functional limitations due to the deficits listed below (see PT Problem List). On eval, pt required supervision bed mobility, CGA transfers, and CGA amb 30' with RW. She demo good sitting balance and poor standing balance. Generalized weakness and decreased activity tolerance. Pt will benefit from acute skilled PT to increase their independence and safety with mobility to allow discharge. Upon d/c, pt would benefit from return to [facility] for further therapy.           If plan is discharge home, recommend the following: A little help with walking and/or transfers;A little help with bathing/dressing/bathroom;Assistance with cooking/housework;Assist for transportation;Help with stairs or ramp for entrance   Can travel by private vehicle   Yes    Equipment Recommendations Rolling walker (2 wheels);BSC/3in1  Recommendations for Other Services       Functional Status Assessment Patient has had a recent decline in their functional status and demonstrates the ability to make significant improvements in function in a reasonable and predictable amount of time.     Precautions / Restrictions Precautions Precautions: Fall;Other (comment) Precaution/Restrictions Comments: watch BP      Mobility  Bed Mobility Overal bed mobility: Needs Assistance Bed Mobility: Supine to Sit, Sit to Supine     Supine to sit: Supervision, HOB elevated, Used rails Sit to supine:  Supervision, Used rails   General bed mobility comments: increased time    Transfers Overall transfer level: Needs assistance Equipment used: Rolling walker (2 wheels) Transfers: Sit to/from Stand, Bed to chair/wheelchair/BSC Sit to Stand: Contact guard assist   Step pivot transfers: Contact guard assist            Ambulation/Gait Ambulation/Gait assistance: Contact guard assist Gait Distance (Feet): 30 Feet Assistive device: Rolling walker (2 wheels) Gait Pattern/deviations: Step-through pattern, Decreased stride length Gait velocity: decreased Gait velocity interpretation: <1.31 ft/sec, indicative of household ambulator   General Gait Details: cues for proximity to [employer] Mobility     Tilt Bed    Modified Rankin (Stroke Patients Only)       Balance Overall balance assessment: Needs assistance Sitting-balance support: No upper extremity supported, Feet supported Sitting balance-Leahy Scale: Good     Standing balance support: Bilateral upper extremity supported, Single extremity supported, During functional activity Standing balance-Leahy Scale: Poor                               Pertinent Vitals/Pain Pain Assessment Pain Assessment: No/denies pain    Home Living Family/patient expects to be discharged to:: Private residence Living Arrangements: Alone Available Help at Discharge: Family;Available PRN/intermittently Type of Home: House Home Access: Stairs to enter Entrance Stairs-Rails: None Entrance Stairs-Number of Steps: 2   Home Layout: One level Home Equipment: Cane - single point;Shower seat Additional Comments: can go stay with daughter (2-story house, able to stay on main level, 4 STE with rails)  Prior Function Prior Level of Function : Independent/Modified Independent             Mobility Comments: SPC at baseline ADLs Comments: pt sits to shower, manages finances and medications.      Extremity/Trunk Assessment   Upper Extremity Assessment Upper Extremity Assessment: Generalized weakness    Lower Extremity Assessment Lower Extremity Assessment: Generalized weakness    Cervical / Trunk Assessment Cervical / Trunk Assessment: Kyphotic  Communication   Communication Communication: No apparent difficulties    Cognition Arousal: Alert Behavior During Therapy: WFL for tasks assessed/performed   PT - Cognitive impairments: Problem solving, Safety/Judgement, Awareness                         Following commands: Intact       Cueing Cueing Techniques: Verbal cues     General Comments      Exercises     Assessment/Plan    PT Assessment Patient needs continued PT services  PT Problem List Decreased strength;Decreased activity tolerance;Decreased balance;Decreased mobility;Cardiopulmonary status limiting activity;Decreased knowledge of use of DME       PT Treatment Interventions DME instruction;Balance training;Gait training;Stair training;Functional mobility training;Therapeutic activities;Therapeutic exercise;Patient/family education    PT Goals (Current goals can be found in the Care Plan section)  Acute Rehab PT Goals Patient Stated Goal: get stronger PT Goal Formulation: With patient Time For Goal Achievement: 06/29/24 Potential to Achieve Goals: Good    Frequency Min 2X/week     Co-evaluation               AM-PAC PT 6 Clicks Mobility  Outcome Measure Help needed turning from your back to your side while in a flat bed without using bedrails?: A Little Help needed moving from lying on your back to sitting on the side of a flat bed without using bedrails?: A Little Help needed moving to and from a bed to a chair (including a wheelchair)?: A Little Help needed standing up from a chair using your arms (e.g., wheelchair or bedside chair)?: A Little Help needed to walk in hospital room?: A Little Help needed climbing 3-5  steps with a railing? : A Lot 6 Click Score: 17    End of Session Equipment Utilized During Treatment: Gait belt Activity Tolerance: Patient tolerated treatment well Patient left: in bed;with call bell/phone within reach Nurse Communication: Mobility status PT Visit Diagnosis: Unsteadiness on feet (R26.81);Other abnormalities of gait and mobility (R26.89);Muscle weakness (generalized) (M62.81)    Time: 9192-9169 PT Time Calculation (min) (ACUTE ONLY): 23 min   Charges:   PT Evaluation $PT Eval Moderate Complexity: 1 Mod   PT General Charges $$ ACUTE PT VISIT: 1 Visit         Sari MATSU., PT  Office # [phone]   Erven Sari Shaker 06/15/2024, 9:01 AM

## 2024-06-15 NOTE — H&P (View-Only) (Signed)
 Consultation Note   Referring Provider:  [facility] PCP: Ileen Rosaline NOVAK, NP Primary Gastroenterologist:   [facility]     Reason for Consultation:  Iron  deficiency anemia DOA: 06/13/2024         Hospital Day: 3   Attending physician's note  I personally saw the patient and performed a substantive portion of this encounter (>50% time spent), including a complete performance of at least one of the key components (MDM, Hx and/or Exam), in conjunction with the APP.  I agree with the APP's note, impression, and recommendations with additional input as follows.     83 yr female with scleroderma esophagus/crest syndrome, recent STEMI with acute on chronic anemia, slow decline in hemoglobin with iron  deficiency anemia Large hiatal hernia, history of Barrett's esophagus Cameron's erosions, gastroduodenal ulcer, AVM and neoplastic lesions in the differential  Will plan to proceed with EGD and colonoscopy for evaluation of acute on chronic iron  deficiency anemia Clear liquid diet Bowel prep N.p.o. after 5 AM  The patient was provided an opportunity to ask questions and all were answered. The patient agreed with the plan and demonstrated an understanding of the instructions.  Kristine [name] , Kristine [name] [phone]    ASSESSMENT     83 y.o. year old female with a medical history including but not limited to CREST syndrome, CAD /  recent NSTEMI / remote DES, large hiatal hernia , Barrett's esophagus, ischemic colitis OSA on CPAP, iron  deficiency anemia.   Worsening of chronic anemia with iron  deficiency anemia No overt GI bleeding / FOBT negative, Received 1 unit PRBCs on 7/17 (recent admission) with improvement in hemoglobin to 9.3.  Now with recurrent decline in hemoglobin .  She could have celiac disease though most likely given history of anemia this has been previously evaluated even though I cannot find any duodenal biopsies or celiac  serologies.  She may have chronic occult GI blood loss secondary to Cameron's erosions given large hiatal hernia.  May have periodic bleeding from AVMs. Gastrointestinal neoplasm also possibility  Loose stool, hypotension Sounds like from the history she was having loose stool after being recently discharged home from hospital with stool softener/laxatives.  BP has improved  Leukocytosis, possible UTI.  Urine culture pending. On Empiric rocephin  WBC 16K  Chronic GERD Esophageal dysmotility in setting of CREST Barrett's esophagus Large hiatal hernia Having epigastric pain which may be related to the large hiatal hernia and/or uncontrolled reflux.  Previously felt not to be a hiatal hernia repair candidate due to esophageal dysmotility  See PMH for additional history  Principal Problem:   Diarrhea Active Problems:   GERD (gastroesophageal reflux disease)   Hypertension   CAD (coronary artery disease), native coronary artery   Hyperlipidemia   OSA on CPAP   Cr(e)st syndrome (HCC)   S/P coronary artery stent placement   Mild intermittent reactive airway disease   UTI (urinary tract infection)   Weakness   Hypotension   Near syncope      PLAN:   --Provide monitor H/H. Hgb has improved to 7.5 post a unit PRBCs on 06/14/2020.  --Recommend IV iron  if not already received.  --Continue twice daily PPI --Stool studies pending but it sounds like the loose  stool was secondary to stool softener/laxatives prescribed upon recent discharge.  She has not had a bowel movement in 2 days --Schedule for EGD and colonoscopy tomorrow. The risks and benefits of EGD with possible biopsies and colonoscopy with possible biopsies and removal of polyps were discussed with the patient who agrees to proceed.  HPI   83 y.o. year old female with a medical history including but not limited to CREST syndrome, CAD /  recent NSTEMI / remote DES, large hiatal hernia , Barrett's esophagus, ischemic colitis OSA  on CPAP.   Brief GI history: Patient followed by [facility] for chronic dysphagia.  Had EGD May 2025 with findings of a markedly dilated esophagus with abundant liquid material and food, gastritis and a large hiatal hernia ( see full report below)  Patient admitted earlier this month with an NSTEMI /respiratory failure.  Heart cath showed nonobstructive disease.  During that admission imaging showed dilated esophagus with air-fluid levels for which we were asked to evaluate.  After review of the chart the consult was deferred since this had already been recently evaluated by her primary GI at [facility].  Apparently during that hospitalization she was found to have iron  deficiency anemia and transfused a unit of blood . Patient was discharged on 06/11/2024 at which time hemoglobin was 9.3  Interval history Patient readmitted 06/13/2024 for diarrhea and low blood pressure.  Daughter tells me that patient was prescribed multiple stool softeners at the time of recent hospital discharge.  Consequently, at [facility] she was having significant amount of loose stool .  She has not had a bowel movement now in 2 days .  Her hemoglobin in the ED her hemoglobin was stable compared to what it was at the time of recent discharge .  The following day it did decline by 3 g so some of that may be related to the IV fluids .  She got a unit of blood with improvement in hemoglobin to 7.5 today .  She has not had any overt GI blood loss.  Other than a daily baby aspirin  she does not take NSAIDs .  She has been having epigastric discomfort radiating through to her back.  She has a history of GERD with Barrett's esophagus and takes pantoprazole  in the morning and famotidine  at bedtime.  Reflux symptoms generally controlled with this regimen.  However, yesterday she began having severe heartburn and nausea.  Hospitalists increased PPI to twice daily.  She does feel little better today.  Despite history of esophageal dysmotility she has  not recently had any swallowing problems.    Relevant workup thus far: Presenting hemoglobin 9.2 , after fluid resuscitation it dropped to 6.7 .  MCV 82, FOBT negative , ionized calcium  low at 0.99, albumin 2.0, LFTs otherwise normal , high-sensitivity troponin 38 >> 33 ( down significantly since last admission when had NSTEMI), renal function normal .  Ferritin 6 .  CXR - small left pleural effusion and significant gaseous distention of esophagus and hiatal hernia suggestive of functional obstruction   Labs and Imaging:  Recent Labs    06/13/24 0932 06/14/24 0324  PROT 4.5* 3.9*  ALBUMIN 2.4* 2.0*  AST 20 13*  ALT 16 12  ALKPHOS 43 34*  BILITOT 0.4 0.5   Recent Labs    06/13/24 0932 06/13/24 1006 06/14/24 0324 06/14/24 1452 06/15/24 0422  WBC 21.1*  --  15.5*  --  16.3*  HGB 9.2*   < > 6.7* 8.5* 7.5*  HCT  31.3*   < > 22.2* 26.4* 24.1*  MCV 84.4  --  82.2  --  85.8  PLT 255  --  213  --  206   < > = values in this interval not displayed.   Recent Labs    06/13/24 0932 06/13/24 1006 06/14/24 0324 06/15/24 0422  NA 137 137  137 134* 134*  K 4.0 4.0  4.0 3.9 3.6  CL 105 105 108 106  CO2 23  --  23 22  GLUCOSE 132* 125* 89 117*  BUN 21 22 25* 16  CREATININE 0.61 0.60 0.65 0.61  CALCIUM  8.3*  --  7.8* 8.0*     DG Chest Port 1 View CLINICAL DATA:  Generalized weakness, hypotension  EXAM: PORTABLE CHEST 1 VIEW  COMPARISON:  06/06/2024  FINDINGS: Interval development of small left pleural effusion. There is marked gaseous distension of the esophagus in addition to a moderate hiatal hernia suggestive of a functional obstruction, better seen on CT examination of 06/08/2024. No pneumothorax. Cardiac size within normal limits. Pulmonary vascularity is normal. No acute bone abnormality.  IMPRESSION: 1. Small left pleural effusion. 2. Marked gaseous distension of the esophagus in addition to a moderate hiatal hernia suggestive of a functional obstruction.  This would be better assessed with endoscopy  Electronically Signed   By: Dorethia Molt M.D.   On: 06/13/2024 09:55    Pertinent GI Studies   EGD 03/26/24 with [facility] Findings:  Esophagus: Patient noted to have a markedly dilated esophagus starting at 18 cm from the incisors.  Abundant liquid material was suctioned, and a significant amount of food was noted adherent to the esophageal wall.   Patient's GE junction appeared widely patent at 40 cm from the incisors. Stomach:  Diffuse, erythematous mucosa was noted in the entire stomach suggestive of gastritis.  Patient noted to have a large hiatal hernia with partial invagination of the stomach into the hernia itself. No additional abnormalities were found on direct and retroflexed views of the stomach.  Duodenum: The duodenal bulb appeared normal. The remaining visualized portion of the duodenum appeared normal.    Colonoscopy ( ? in 2015)   Past Medical History:  Diagnosis Date   Anemia    Arthritis    Asthma    Barrett's esophagus    Stage B   CAD (coronary artery disease)    a. s/p STEMI 7/14 >> LHC: LM 20, mLAD 30-40, small Dx 100 (L-L collats), OM1 90, OM2 30-40, mRCA 50-60, Lat/AL akinesis, EF 35-40% >> PCI: Promus DES to OM1;  b. Myoview 3/16: diaph atten, no ischemia, EF 59% (intermediate risk b/c BP drop 152>>137, but recovery BP 162 (tech error) >> med Rx continued  c. cath 05/30/17 s/p FFR guided PCI with DES to circumflex/first OM    COPD exacerbation (HCC) 06/04/2024   COVID-19 virus infection    CREST variant of scleroderma (HCC)    Cystocele    Diverticulitis    Event monitor 04/2019   Event monitor 04/2019: Sinus rhythm; no atrial fibrillation/flutter; no bradycardic events; few PVCs and occasional ventricular runs up to 9 beats   GERD (gastroesophageal reflux disease)    History of blood transfusion    hx of being Anti-K positive   History of Doppler ultrasound    a. Carotid US  2/12: bilateral ICA < 50%    Hyperlipidemia    Hypertension    Ischemic cardiomyopathy    a. EF 35-40% by LHC at time of MI;  b.  Echo 7/14:  Mild focal basal septal hypertrophy, EF 50-55%, apical HK, grade 1 diastolic dysfunction, trivial AI    Ischemic colitis (HCC)    Orthostatic hypotension    OSA on CPAP    Osteoporosis 12/2017   T score -2.6   Raynaud disease    Rectocele    mild    Past Surgical History:  Procedure Laterality Date   ABDOMINAL HYSTERECTOMY  1996   RSO for menorrhagia.   CORONARY ANGIOPLASTY WITH STENT PLACEMENT  06/21/2013   CORONARY PRESSURE/FFR STUDY N/A 05/30/2017   Procedure: Intravascular Pressure Wire/FFR Study;  Surgeon: Wonda Sharper, Kristine [name];  Location: [facility] INVASIVE CV LAB;  Service: Cardiovascular;  Laterality: N/A;   CORONARY STENT INTERVENTION N/A 05/30/2017   Procedure: Coronary Stent Intervention;  Surgeon: Wonda Sharper, Kristine [name];  Location: [facility] INVASIVE CV LAB;  Service: Cardiovascular;  Laterality: N/A;   FOOT SURGERY  2013   x2   JOINT REPLACEMENT  9/10   rt total   LEFT HEART CATH AND CORONARY ANGIOGRAPHY N/A 05/30/2017   Procedure: Left Heart Cath and Coronary Angiography;  Surgeon: Wonda Sharper, Kristine [name];  Location: [facility] INVASIVE CV LAB;  Service: Cardiovascular;  Laterality: N/A;   LEFT HEART CATH AND CORONARY ANGIOGRAPHY N/A 06/07/2024   Procedure: LEFT HEART CATH AND CORONARY ANGIOGRAPHY;  Surgeon: Mady Bruckner, Kristine [name];  Location: [facility] INVASIVE CV LAB;  Service: Cardiovascular;  Laterality: N/A;   nissen fundoplication  2006   ROTATOR CUFF REPAIR     TONSILLECTOMY  1950   TOTAL KNEE ARTHROPLASTY     right   TUBAL LIGATION  1980    Family History  Problem Relation Age of Onset   Cancer Mother        Pancreatic Cancer   Hepatitis C Mother    Heart disease Father        congestive heart failure   Heart attack Father    Panic disorder Daughter    Hypertension Daughter    Asthma Maternal Grandmother    Emphysema Maternal Grandmother    Cancer Maternal Grandfather        lung    Stroke Neg Hx     Prior to Admission medications   Medication Sig Start Date End Date Taking? Authorizing Provider  acetaminophen  (TYLENOL ) 500 MG tablet Take 1,000 mg by mouth every 6 (six) hours as needed for headache or moderate pain (pain score 4-6).   Yes Provider, Historical, Kristine [name]  albuterol  (VENTOLIN  HFA) 108 (90 Base) MCG/ACT inhaler Inhale 1 puff into the lungs every 4 (four) hours as needed for wheezing or shortness of breath. 03/14/22  Yes Provider, Historical, Kristine [name]  aspirin  EC 81 MG EC tablet Take 1 tablet (81 mg total) by mouth daily. Patient taking differently: Take 81 mg by mouth in the morning. 06/24/13  Yes Barrett, Rhonda G, PA-C  b complex vitamins capsule Take 1 capsule by mouth in the morning.   Yes Provider, Historical, Kristine [name]  bisacodyl (DULCOLAX) 10 MG suppository Place 10 mg rectally as needed for moderate constipation.   Yes Provider, Historical, Kristine [name]  famotidine  (PEPCID ) 20 MG tablet Take 40 mg by mouth at bedtime.   Yes Provider, Historical, Kristine [name]  ferrous sulfate  325 (65 FE) MG tablet Take 1 tablet (325 mg total) by mouth daily with breakfast. 06/11/24  Yes Kristine [name], Kristine [name], Kristine [name]  fluticasone  furoate-vilanterol (BREO ELLIPTA ) 100-25 MCG/ACT AEPB Inhale 1 puff into the lungs daily. Patient taking differently: Inhale 1 puff into the lungs in the morning. 06/12/24  Yes Kristine [name], Kristine  [name], Kristine [name]  ipratropium-albuterol  (DUONEB) 0.5-2.5 (3) MG/3ML SOLN Take 3 mLs by nebulization every 6 (six) hours as needed. 06/11/24  Yes Kristine [name], Kristine [name], Kristine [name]  magnesium  hydroxide (MILK OF MAGNESIA) 400 MG/5ML suspension Take 30 mLs by mouth as needed for mild constipation.   Yes Provider, Historical, Kristine [name]  MAGNESIUM  PO Take 1 tablet by mouth in the morning.   Yes Provider, Historical, Kristine [name]  metoprolol  succinate (TOPROL  XL) 25 MG 24 hr tablet Take 0.5 tablets (12.5 mg total) by mouth daily. Patient taking differently: Take 12.5 mg by mouth in the morning. 07/30/23  Yes Weaver, Scott T, PA-C  nitroGLYCERIN   (NITROSTAT ) 0.4 MG SL tablet Place 1 tablet (0.4 mg total) under the tongue every 5 (five) minutes x 3 doses as needed for chest pain. 06/11/23  Yes Weaver, Scott T, PA-C  Nutritional Supplements (NUTRITIONAL SUPPLEMENT PO) Take 120 mLs by mouth in the morning and at bedtime. MedPass   Yes Provider, Historical, Kristine [name]  OLIVE LEAF EXTRACT PO Take 1 capsule by mouth in the morning.   Yes Provider, Historical, Kristine [name]  pantoprazole  (PROTONIX ) 40 MG tablet Take 40 mg by mouth in the morning.   Yes Provider, Historical, Kristine [name]  pravastatin  (PRAVACHOL ) 40 MG tablet Take 1 tablet (40 mg total) by mouth every evening. 06/11/24  Yes Kristine [name], Kristine [name], Kristine [name]  Quercetin 250 MG TABS Take 250 mg by mouth in the morning. 06/12/24  Yes Provider, Historical, Kristine [name]  senna-docusate (SENOKOT-S) 8.6-50 MG tablet Take 1 tablet by mouth 2 (two) times daily. 06/11/24  Yes Kristine [name], Kristine [name], Kristine [name]  SODIUM PHOSPHATES RE Place 1 Insert rectally as needed. Enema   Yes Provider, Historical, Kristine [name]    Current Facility-Administered Medications  Medication Dose Route Frequency Provider Last Rate Last Admin   0.9 %  sodium chloride  infusion   Intravenous Continuous Amin, Ankit C, Kristine [name]       acetaminophen  (TYLENOL ) tablet 650 mg  650 mg Oral Q6H PRN Seena Marsa NOVAK, Kristine [name]       Or   acetaminophen  (TYLENOL ) suppository 650 mg  650 mg Rectal Q6H PRN Seena Marsa NOVAK, Kristine [name]       alum & mag hydroxide-simeth (MAALOX/MYLANTA) 200-200-20 MG/5ML suspension 30 mL  30 mL Oral Q6H PRN Sundil, Subrina, Kristine [name]   30 mL at 06/14/24 2246   aspirin  EC tablet 81 mg  81 mg Oral Daily Melvin, Alexander B, Kristine [name]   81 mg at 06/15/24 0949   cefTRIAXone  (ROCEPHIN ) 1 g in sodium chloride  0.9 % 100 mL IVPB  1 g Intravenous Q24H Seena Marsa NOVAK, Kristine [name] 200 mL/hr at 06/14/24 1220 1 g at 06/14/24 1220   famotidine  (PEPCID ) tablet 40 mg  40 mg Oral QHS Melvin, Alexander B, Kristine [name]   40 mg at 06/14/24 2130   feeding supplement (ENSURE PLUS HIGH PROTEIN) liquid 237 mL  237 mL Oral BID BM Melvin,  Alexander B, Kristine [name]   237 mL at 06/14/24 1502   fluticasone  furoate-vilanterol (BREO ELLIPTA ) 100-25 MCG/ACT 1 puff  1 puff Inhalation Daily Melvin, Alexander B, Kristine [name]   1 puff at 06/15/24 9085   hydrALAZINE  (APRESOLINE ) injection 10 mg  10 mg Intravenous Q4H PRN Amin, Ankit C, Kristine [name]       ipratropium-albuterol  (DUONEB) 0.5-2.5 (3) MG/3ML nebulizer solution 3 mL  3 mL Nebulization Q4H PRN Amin, Ankit C, Kristine [name]       metoprolol  succinate (TOPROL -XL) 24 hr tablet 12.5 mg  12.5 mg Oral Daily Melvin, Alexander B, Kristine [name]  metoprolol  tartrate (LOPRESSOR ) injection 5 mg  5 mg Intravenous Q4H PRN Amin, Ankit C, Kristine [name]       ondansetron  (ZOFRAN ) injection 4 mg  4 mg Intravenous Q6H PRN Amin, Ankit C, Kristine [name]   4 mg at 06/14/24 1858   pantoprazole  (PROTONIX ) EC tablet 40 mg  40 mg Oral BID AC Amin, Ankit C, Kristine [name]   40 mg at 06/15/24 1002   potassium chloride  10 mEq in 100 mL IVPB  10 mEq Intravenous Q1 Hr x 2 Amin, Ankit C, Kristine [name] 100 mL/hr at 06/15/24 0939 10 mEq at 06/15/24 9060   pravastatin  (PRAVACHOL ) tablet 40 mg  40 mg Oral QPM Seena Marsa NOVAK, Kristine [name]   40 mg at 06/14/24 2130   senna-docusate (Senokot-S) tablet 1 tablet  1 tablet Oral QHS PRN Amin, Ankit C, Kristine [name]       sodium chloride  flush (NS) 0.9 % injection 3 mL  3 mL Intravenous Q12H Seena Marsa NOVAK, Kristine [name]   3 mL at 06/15/24 0950    Allergies as of 06/13/2024 - Review Complete 06/13/2024  Allergen Reaction Noted   Nsaids Other (See Comments) 09/03/2011   Aspirin  Other (See Comments) 02/27/2012    Social History   Socioeconomic History   Marital status: Divorced    Spouse name: Not on file   Number of children: Not on file   Years of education: Not on file   Highest education level: Not on file  Occupational History   Not on file  Tobacco Use   Smoking status: Never   Smokeless tobacco: Never  Vaping Use   Vaping status: Never Used  Substance and Sexual Activity   Alcohol use: No    Alcohol/week: 0.0 standard drinks of alcohol   Drug use: No   Sexual  activity: Never    Comment: 1st intercourse 74 yo-1 partner  Other Topics Concern   Not on file  Social History Narrative   Exercise dancing 3 x times weekly for 1-2 hours   Social Drivers of Corporate investment banker Strain: Not on file  Food Insecurity: No Food Insecurity (06/13/2024)   Hunger Vital Sign    Worried About Running Out of Food in the Last Year: Never true    Ran Out of Food in the Last Year: Never true  Transportation Needs: No Transportation Needs (06/13/2024)   PRAPARE - Administrator, Civil Service (Medical): No    Lack of Transportation (Non-Medical): No  Physical Activity: Not on file  Stress: Stress Concern Present (03/26/2024)   Received from [facility] - Occupational Stress Questionnaire    Feeling of Stress : Rather much  Social Connections: Moderately Integrated (06/13/2024)   Social Connection and Isolation Panel    Frequency of Communication with Friends and Family: More than three times a week    Frequency of Social Gatherings with Friends and Family: Once a week    Attends Religious Services: 1 to 4 times per year    Active Member of [employer] or Organizations: No    Attends Banker Meetings: 1 to 4 times per year    Marital Status: Divorced  Catering manager Violence: Not At Risk (06/13/2024)   Humiliation, Afraid, Rape, and Kick questionnaire    Fear of Current or Ex-Partner: No    Emotionally Abused: No    Physically Abused: No    Sexually Abused: No     Code Status   Code Status: Full Code  Review of Systems: All systems reviewed and negative except where noted in HPI.  Physical Exam: Vital signs in last 24 hours: Temp:  [98.3 F (36.8 C)-98.9 F (37.2 C)] 98.3 F (36.8 C) (07/22 0833) Pulse Rate:  [80-102] 102 (07/22 0833) Resp:  [16-20] 16 (07/22 0833) BP: (103-146)/(47-70) 109/55 (07/22 0833) SpO2:  [93 %-97 %] 95 % (07/22 0833) Last BM Date :  06/12/24  General:  Pleasant female in NAD Psych:  Cooperative. Normal mood and affect Eyes: Pupils equal Ears:  Normal auditory acuity Nose: No deformity, discharge or lesions Neck:  Supple, no masses felt Lungs:  Clear to auscultation.  Heart:  Regular rate, regular rhythm.  Abdomen:  Soft, nondistended, nontender, active bowel sounds, no masses felt Rectal :  Deferred Msk: Symmetrical without gross deformities.  Neurologic:  Alert, oriented, grossly normal neurologically Extremities : No edema Skin:  Intact without significant lesions.    Intake/Output from previous day: 07/21 0701 - 07/22 0700 In: 381.5 [Blood:381.5] Out: -  Intake/Output this shift:  No intake/output data recorded.   Vina Dasen, NP-C   06/15/2024, 10:32 AM

## 2024-06-15 NOTE — Plan of Care (Signed)

## 2024-06-15 NOTE — Plan of Care (Signed)
   Problem: Activity: Goal: Risk for activity intolerance will decrease Outcome: Progressing   Problem: Nutrition: Goal: Adequate nutrition will be maintained Outcome: Progressing   Problem: Coping: Goal: Level of anxiety will decrease Outcome: Progressing

## 2024-06-15 NOTE — Plan of Care (Addendum)
 Syncopal episode-vasovagal syncope:  Received call from patient's RN that patient had a syncopal episode after having  black-colored tarry stool.  Evaluated at the bedside she is alert and oriented.  Patient is asymptomatic.  There is some blood colored./tarry stool around 250 mL on the bed. Fortunately hemodynamically stable MAP 71.  Patient has been admitted for syncopal episode and hypotension in setting of diarrhea and acute on chronic iron  deficiency anemia. On presentation to ED hemoglobin of 6.7 and after transfusion hemoglobin has been improved around 7.5.  In the setting of another syncopal episode giving her 1 L of NS bolus after 1 maintenance fluid NS 1 to 25 cc/h.  Obtaining stat H&H as hemoglobin is dropping below 7 will transfuse blood as required. Continue monitor H&H and transfuse as needed.  Patient is being scheduled for EGD and colonoscopy in the a.m.  Continue bowel prep.   Update, low hemoglobin 5.7.  Transfusing 2 units of blood.  Lennix Kneisel, MD [facility] 06/15/2024, 8:28 PM

## 2024-06-15 NOTE — Plan of Care (Signed)
 Syncopal episode-vasovagal syncope: Received call from patient's RN that patient had a syncopal episode after having a small volume of black-colored tarry stool.  Evaluated at the bedside she is alert and oriented.  Patient is asymptomatic.  There is some blood colored tarry stool around 250 mL on the bed. Fortunately hemodynamically stable MAP 71.  Patient has been admitted for syncopal episode and hypotension in setting of diarrhea and acute on chronic iron  deficiency anemia. On presentation to ED hemoglobin of 6.7 and after transfusion hemoglobin has been improved around 7.5.  In the setting of another syncopal episode giving her 1 L of NS bolus after 1 maintenance fluid NS 1 to 25 cc/h.  Obtaining stat H&H as hemoglobin is dropping below 7 will transfuse blood as required. Continue monitor H&H and transfuse as needed.  Patient is being scheduled for EGD and colonoscopy in the a.m.  Continue bowel prep.   Reino Lybbert, MD [facility] 06/15/2024, 8:18 PM

## 2024-06-15 NOTE — Consult Note (Addendum)
 Consultation Note   Referring Provider:  [facility] PCP: Ileen Rosaline NOVAK, NP Primary Gastroenterologist:   [facility]     Reason for Consultation:  Iron  deficiency anemia DOA: 06/13/2024         Hospital Day: 3   Attending physician's note  I personally saw the patient and performed a substantive portion of this encounter (>50% time spent), including a complete performance of at least one of the key components (MDM, Hx and/or Exam), in conjunction with the APP.  I agree with the APP's note, impression, and recommendations with additional input as follows.     83 yr female with scleroderma esophagus/crest syndrome, recent STEMI with acute on chronic anemia, slow decline in hemoglobin with iron  deficiency anemia Large hiatal hernia, history of Barrett's esophagus Cameron's erosions, gastroduodenal ulcer, AVM and neoplastic lesions in the differential  Will plan to proceed with EGD and colonoscopy for evaluation of acute on chronic iron  deficiency anemia Clear liquid diet Bowel prep N.p.o. after 5 AM  The patient was provided an opportunity to ask questions and all were answered. The patient agreed with the plan and demonstrated an understanding of the instructions.  LOIS Wilkie Mcgee , MD [phone]    ASSESSMENT     83 y.o. year old female with a medical history including but not limited to CREST syndrome, CAD /  recent NSTEMI / remote DES, large hiatal hernia , Barrett's esophagus, ischemic colitis OSA on CPAP, iron  deficiency anemia.   Worsening of chronic anemia with iron  deficiency anemia No overt GI bleeding / FOBT negative, Received 1 unit PRBCs on 7/17 (recent admission) with improvement in hemoglobin to 9.3.  Now with recurrent decline in hemoglobin .  She could have celiac disease though most likely given history of anemia this has been previously evaluated even though I cannot find any duodenal biopsies or celiac  serologies.  She may have chronic occult GI blood loss secondary to Cameron's erosions given large hiatal hernia.  May have periodic bleeding from AVMs. Gastrointestinal neoplasm also possibility  Loose stool, hypotension Sounds like from the history she was having loose stool after being recently discharged home from hospital with stool softener/laxatives.  BP has improved  Leukocytosis, possible UTI.  Urine culture pending. On Empiric rocephin  WBC 16K  Chronic GERD Esophageal dysmotility in setting of CREST Barrett's esophagus Large hiatal hernia Having epigastric pain which may be related to the large hiatal hernia and/or uncontrolled reflux.  Previously felt not to be a hiatal hernia repair candidate due to esophageal dysmotility  See PMH for additional history  Principal Problem:   Diarrhea Active Problems:   GERD (gastroesophageal reflux disease)   Hypertension   CAD (coronary artery disease), native coronary artery   Hyperlipidemia   OSA on CPAP   Cr(e)st syndrome (HCC)   S/P coronary artery stent placement   Mild intermittent reactive airway disease   UTI (urinary tract infection)   Weakness   Hypotension   Near syncope      PLAN:   --Provide monitor H/H. Hgb has improved to 7.5 post a unit PRBCs on 06/14/2020.  --Recommend IV iron  if not already received.  --Continue twice daily PPI --Stool studies pending but it sounds like the loose  stool was secondary to stool softener/laxatives prescribed upon recent discharge.  She has not had a bowel movement in 2 days --Schedule for EGD and colonoscopy tomorrow. The risks and benefits of EGD with possible biopsies and colonoscopy with possible biopsies and removal of polyps were discussed with the patient who agrees to proceed.  HPI   83 y.o. year old female with a medical history including but not limited to CREST syndrome, CAD /  recent NSTEMI / remote DES, large hiatal hernia , Barrett's esophagus, ischemic colitis OSA  on CPAP.   Brief GI history: Patient followed by [facility] for chronic dysphagia.  Had EGD May 2025 with findings of a markedly dilated esophagus with abundant liquid material and food, gastritis and a large hiatal hernia ( see full report below)  Patient admitted earlier this month with an NSTEMI /respiratory failure.  Heart cath showed nonobstructive disease.  During that admission imaging showed dilated esophagus with air-fluid levels for which we were asked to evaluate.  After review of the chart the consult was deferred since this had already been recently evaluated by her primary GI at [facility].  Apparently during that hospitalization she was found to have iron  deficiency anemia and transfused a unit of blood . Patient was discharged on 06/11/2024 at which time hemoglobin was 9.3  Interval history Patient readmitted 06/13/2024 for diarrhea and low blood pressure.  Daughter tells me that patient was prescribed multiple stool softeners at the time of recent hospital discharge.  Consequently, at [facility] she was having significant amount of loose stool .  She has not had a bowel movement now in 2 days .  Her hemoglobin in the ED her hemoglobin was stable compared to what it was at the time of recent discharge .  The following day it did decline by 3 g so some of that may be related to the IV fluids .  She got a unit of blood with improvement in hemoglobin to 7.5 today .  She has not had any overt GI blood loss.  Other than a daily baby aspirin  she does not take NSAIDs .  She has been having epigastric discomfort radiating through to her back.  She has a history of GERD with Barrett's esophagus and takes pantoprazole  in the morning and famotidine  at bedtime.  Reflux symptoms generally controlled with this regimen.  However, yesterday she began having severe heartburn and nausea.  Hospitalists increased PPI to twice daily.  She does feel little better today.  Despite history of esophageal dysmotility she has  not recently had any swallowing problems.    Relevant workup thus far: Presenting hemoglobin 9.2 , after fluid resuscitation it dropped to 6.7 .  MCV 82, FOBT negative , ionized calcium  low at 0.99, albumin 2.0, LFTs otherwise normal , high-sensitivity troponin 38 >> 33 ( down significantly since last admission when had NSTEMI), renal function normal .  Ferritin 6 .  CXR - small left pleural effusion and significant gaseous distention of esophagus and hiatal hernia suggestive of functional obstruction   Labs and Imaging:  Recent Labs    06/13/24 0932 06/14/24 0324  PROT 4.5* 3.9*  ALBUMIN 2.4* 2.0*  AST 20 13*  ALT 16 12  ALKPHOS 43 34*  BILITOT 0.4 0.5   Recent Labs    06/13/24 0932 06/13/24 1006 06/14/24 0324 06/14/24 1452 06/15/24 0422  WBC 21.1*  --  15.5*  --  16.3*  HGB 9.2*   < > 6.7* 8.5* 7.5*  HCT  31.3*   < > 22.2* 26.4* 24.1*  MCV 84.4  --  82.2  --  85.8  PLT 255  --  213  --  206   < > = values in this interval not displayed.   Recent Labs    06/13/24 0932 06/13/24 1006 06/14/24 0324 06/15/24 0422  NA 137 137  137 134* 134*  K 4.0 4.0  4.0 3.9 3.6  CL 105 105 108 106  CO2 23  --  23 22  GLUCOSE 132* 125* 89 117*  BUN 21 22 25* 16  CREATININE 0.61 0.60 0.65 0.61  CALCIUM  8.3*  --  7.8* 8.0*     DG Chest Port 1 View CLINICAL DATA:  Generalized weakness, hypotension  EXAM: PORTABLE CHEST 1 VIEW  COMPARISON:  06/06/2024  FINDINGS: Interval development of small left pleural effusion. There is marked gaseous distension of the esophagus in addition to a moderate hiatal hernia suggestive of a functional obstruction, better seen on CT examination of 06/08/2024. No pneumothorax. Cardiac size within normal limits. Pulmonary vascularity is normal. No acute bone abnormality.  IMPRESSION: 1. Small left pleural effusion. 2. Marked gaseous distension of the esophagus in addition to a moderate hiatal hernia suggestive of a functional obstruction.  This would be better assessed with endoscopy  Electronically Signed   By: Dorethia Molt M.D.   On: 06/13/2024 09:55    Pertinent GI Studies   EGD 03/26/24 with [facility] Findings:  Esophagus: Patient noted to have a markedly dilated esophagus starting at 18 cm from the incisors.  Abundant liquid material was suctioned, and a significant amount of food was noted adherent to the esophageal wall.   Patient's GE junction appeared widely patent at 40 cm from the incisors. Stomach:  Diffuse, erythematous mucosa was noted in the entire stomach suggestive of gastritis.  Patient noted to have a large hiatal hernia with partial invagination of the stomach into the hernia itself. No additional abnormalities were found on direct and retroflexed views of the stomach.  Duodenum: The duodenal bulb appeared normal. The remaining visualized portion of the duodenum appeared normal.    Colonoscopy ( ? in 2015)   Past Medical History:  Diagnosis Date   Anemia    Arthritis    Asthma    Barrett's esophagus    Stage B   CAD (coronary artery disease)    a. s/p STEMI 7/14 >> LHC: LM 20, mLAD 30-40, small Dx 100 (L-L collats), OM1 90, OM2 30-40, mRCA 50-60, Lat/AL akinesis, EF 35-40% >> PCI: Promus DES to OM1;  b. Myoview 3/16: diaph atten, no ischemia, EF 59% (intermediate risk b/c BP drop 152>>137, but recovery BP 162 (tech error) >> med Rx continued  c. cath 05/30/17 s/p FFR guided PCI with DES to circumflex/first OM    COPD exacerbation (HCC) 06/04/2024   COVID-19 virus infection    CREST variant of scleroderma (HCC)    Cystocele    Diverticulitis    Event monitor 04/2019   Event monitor 04/2019: Sinus rhythm; no atrial fibrillation/flutter; no bradycardic events; few PVCs and occasional ventricular runs up to 9 beats   GERD (gastroesophageal reflux disease)    History of blood transfusion    hx of being Anti-K positive   History of Doppler ultrasound    a. Carotid US  2/12: bilateral ICA < 50%    Hyperlipidemia    Hypertension    Ischemic cardiomyopathy    a. EF 35-40% by LHC at time of MI;  b.  Echo 7/14:  Mild focal basal septal hypertrophy, EF 50-55%, apical HK, grade 1 diastolic dysfunction, trivial AI    Ischemic colitis (HCC)    Orthostatic hypotension    OSA on CPAP    Osteoporosis 12/2017   T score -2.6   Raynaud disease    Rectocele    mild    Past Surgical History:  Procedure Laterality Date   ABDOMINAL HYSTERECTOMY  1996   RSO for menorrhagia.   CORONARY ANGIOPLASTY WITH STENT PLACEMENT  06/21/2013   CORONARY PRESSURE/FFR STUDY N/A 05/30/2017   Procedure: Intravascular Pressure Wire/FFR Study;  Surgeon: Wonda Sharper, MD;  Location[facility] INVASIVE CV LAB;  Service: Cardiovascular;  Laterality: N/A;   CORONARY STENT INTERVENTION N/A 05/30/2017   Procedure: Coronary Stent Intervention;  Surgeon: Wonda Sharper, MD;  Location: [facility] INVASIVE CV LAB;  Service: Cardiovascular;  Laterality: N/A;   FOOT SURGERY  2013   x2   JOINT REPLACEMENT  9/10   rt total   LEFT HEART CATH AND CORONARY ANGIOGRAPHY N/A 05/30/2017   Procedure: Left Heart Cath and Coronary Angiography;  Surgeon: Wonda Sharper, MD;  Location: [facility] INVASIVE CV LAB;  Service: Cardiovascular;  Laterality: N/A;   LEFT HEART CATH AND CORONARY ANGIOGRAPHY N/A 06/07/2024   Procedure: LEFT HEART CATH AND CORONARY ANGIOGRAPHY;  Surgeon: Mady Bruckner, MD;  Location: [facility] INVASIVE CV LAB;  Service: Cardiovascular;  Laterality: N/A;   nissen fundoplication  2006   ROTATOR CUFF REPAIR     TONSILLECTOMY  1950   TOTAL KNEE ARTHROPLASTY     right   TUBAL LIGATION  1980    Family History  Problem Relation Age of Onset   Cancer Mother        Pancreatic Cancer   Hepatitis C Mother    Heart disease Father        congestive heart failure   Heart attack Father    Panic disorder Daughter    Hypertension Daughter    Asthma Maternal Grandmother    Emphysema Maternal Grandmother    Cancer Maternal Grandfather        lung    Stroke Neg Hx     Prior to Admission medications   Medication Sig Start Date End Date Taking? Authorizing Provider  acetaminophen  (TYLENOL ) 500 MG tablet Take 1,000 mg by mouth every 6 (six) hours as needed for headache or moderate pain (pain score 4-6).   Yes [provider]  albuterol  (VENTOLIN  HFA) 108 (90 Base) MCG/ACT inhaler Inhale 1 puff into the lungs every 4 (four) hours as needed for wheezing or shortness of breath. 03/14/22  Yes [provider]  aspirin  EC 81 MG EC tablet Take 1 tablet (81 mg total) by mouth daily. Patient taking differently: Take 81 mg by mouth in the morning. 06/24/13  Yes Barrett, Rhonda G, PA-C  b complex vitamins capsule Take 1 capsule by mouth in the morning.   Yes [provider]  bisacodyl (DULCOLAX) 10 MG suppository Place 10 mg rectally as needed for moderate constipation.   Yes [provider]  famotidine  (PEPCID ) 20 MG tablet Take 40 mg by mouth at bedtime.   Yes [provider]  ferrous sulfate  325 (65 FE) MG tablet Take 1 tablet (325 mg total) by mouth daily with breakfast. 06/11/24  Yes Ezenduka, Nkeiruka J, MD  fluticasone  furoate-vilanterol (BREO ELLIPTA ) 100-25 MCG/ACT AEPB Inhale 1 puff into the lungs daily. Patient taking differently: Inhale 1 puff into the lungs in the morning. 06/12/24  Yes Ezenduka, Nkeiruka  J, MD  ipratropium-albuterol  (DUONEB) 0.5-2.5 (3) MG/3ML SOLN Take 3 mLs by nebulization every 6 (six) hours as needed. 06/11/24  Yes Ezenduka, Nkeiruka J, MD  magnesium  hydroxide (MILK OF MAGNESIA) 400 MG/5ML suspension Take 30 mLs by mouth as needed for mild constipation.   Yes [provider]  MAGNESIUM  PO Take 1 tablet by mouth in the morning.   Yes [provider]  metoprolol  succinate (TOPROL  XL) 25 MG 24 hr tablet Take 0.5 tablets (12.5 mg total) by mouth daily. Patient taking differently: Take 12.5 mg by mouth in the morning. 07/30/23  Yes Weaver, Scott T, PA-C  nitroGLYCERIN   (NITROSTAT ) 0.4 MG SL tablet Place 1 tablet (0.4 mg total) under the tongue every 5 (five) minutes x 3 doses as needed for chest pain. 06/11/23  Yes Weaver, Scott T, PA-C  Nutritional Supplements (NUTRITIONAL SUPPLEMENT PO) Take 120 mLs by mouth in the morning and at bedtime. MedPass   Yes [provider]  OLIVE LEAF EXTRACT PO Take 1 capsule by mouth in the morning.   Yes [provider]  pantoprazole  (PROTONIX ) 40 MG tablet Take 40 mg by mouth in the morning.   Yes [provider]  pravastatin  (PRAVACHOL ) 40 MG tablet Take 1 tablet (40 mg total) by mouth every evening. 06/11/24  Yes Ezenduka, Nkeiruka J, MD  Quercetin 250 MG TABS Take 250 mg by mouth in the morning. 06/12/24  Yes [provider]  senna-docusate (SENOKOT-S) 8.6-50 MG tablet Take 1 tablet by mouth 2 (two) times daily. 06/11/24  Yes Ezenduka, Nkeiruka J, MD  SODIUM PHOSPHATES RE Place 1 Insert rectally as needed. Enema   Yes [provider]    Current Facility-Administered Medications  Medication Dose Route Frequency Provider Last Rate Last Admin   0.9 %  sodium chloride  infusion   Intravenous Continuous Amin, Ankit C, MD       acetaminophen  (TYLENOL ) tablet 650 mg  650 mg Oral Q6H PRN Seena Marsa NOVAK, MD       Or   acetaminophen  (TYLENOL ) suppository 650 mg  650 mg Rectal Q6H PRN Seena Marsa NOVAK, MD       alum & mag hydroxide-simeth (MAALOX/MYLANTA) 200-200-20 MG/5ML suspension 30 mL  30 mL Oral Q6H PRN Sundil, Subrina, MD   30 mL at 06/14/24 2246   aspirin  EC tablet 81 mg  81 mg Oral Daily Melvin, Alexander B, MD   81 mg at 06/15/24 0949   cefTRIAXone  (ROCEPHIN ) 1 g in sodium chloride  0.9 % 100 mL IVPB  1 g Intravenous Q24H Seena Marsa NOVAK, MD 200 mL/hr at 06/14/24 1220 1 g at 06/14/24 1220   famotidine  (PEPCID ) tablet 40 mg  40 mg Oral QHS Melvin, Alexander B, MD   40 mg at 06/14/24 2130   feeding supplement (ENSURE PLUS HIGH PROTEIN) liquid 237 mL  237 mL Oral BID BM Melvin,  Alexander B, MD   237 mL at 06/14/24 1502   fluticasone  furoate-vilanterol (BREO ELLIPTA ) 100-25 MCG/ACT 1 puff  1 puff Inhalation Daily Melvin, Alexander B, MD   1 puff at 06/15/24 9085   hydrALAZINE  (APRESOLINE ) injection 10 mg  10 mg Intravenous Q4H PRN Amin, Ankit C, MD       ipratropium-albuterol  (DUONEB) 0.5-2.5 (3) MG/3ML nebulizer solution 3 mL  3 mL Nebulization Q4H PRN Amin, Ankit C, MD       metoprolol  succinate (TOPROL -XL) 24 hr tablet 12.5 mg  12.5 mg Oral Daily Melvin, Alexander B, MD  metoprolol  tartrate (LOPRESSOR ) injection 5 mg  5 mg Intravenous Q4H PRN Amin, Ankit C, MD       ondansetron  (ZOFRAN ) injection 4 mg  4 mg Intravenous Q6H PRN Amin, Ankit C, MD   4 mg at 06/14/24 1858   pantoprazole  (PROTONIX ) EC tablet 40 mg  40 mg Oral BID AC Amin, Ankit C, MD   40 mg at 06/15/24 1002   potassium chloride  10 mEq in 100 mL IVPB  10 mEq Intravenous Q1 Hr x 2 Amin, Ankit C, MD 100 mL/hr at 06/15/24 0939 10 mEq at 06/15/24 9060   pravastatin  (PRAVACHOL ) tablet 40 mg  40 mg Oral QPM Seena Marsa NOVAK, MD   40 mg at 06/14/24 2130   senna-docusate (Senokot-S) tablet 1 tablet  1 tablet Oral QHS PRN Amin, Ankit C, MD       sodium chloride  flush (NS) 0.9 % injection 3 mL  3 mL Intravenous Q12H Seena Marsa NOVAK, MD   3 mL at 06/15/24 0950    Allergies as of 06/13/2024 - Review Complete 06/13/2024  Allergen Reaction Noted   Nsaids Other (See Comments) 09/03/2011   Aspirin  Other (See Comments) 02/27/2012    Social History   Socioeconomic History   Marital status: Divorced    Spouse name: Not on file   Number of children: Not on file   Years of education: Not on file   Highest education level: Not on file  Occupational History   Not on file  Tobacco Use   Smoking status: Never   Smokeless tobacco: Never  Vaping Use   Vaping status: Never Used  Substance and Sexual Activity   Alcohol use: No    Alcohol/week: 0.0 standard drinks of alcohol   Drug use: No   Sexual  activity: Never    Comment: 1st intercourse 74 yo-1 partner  Other Topics Concern   Not on file  Social History Narrative   Exercise dancing 3 x times weekly for 1-2 hours   Social Drivers of Corporate investment banker Strain: Not on file  Food Insecurity: No Food Insecurity (06/13/2024)   Hunger Vital Sign    Worried About Running Out of Food in the Last Year: Never true    Ran Out of Food in the Last Year: Never true  Transportation Needs: No Transportation Needs (06/13/2024)   PRAPARE - Administrator, Civil Service (Medical): No    Lack of Transportation (Non-Medical): No  Physical Activity: Not on file  Stress: Stress Concern Present (03/26/2024)   Received from [facility] - Occupational Stress Questionnaire    Feeling of Stress : Rather much  Social Connections: Moderately Integrated (06/13/2024)   Social Connection and Isolation Panel    Frequency of Communication with Friends and Family: More than three times a week    Frequency of Social Gatherings with Friends and Family: Once a week    Attends Religious Services: 1 to 4 times per year    Active Member of [employer] or Organizations: No    Attends Banker Meetings: 1 to 4 times per year    Marital Status: Divorced  Catering manager Violence: Not At Risk (06/13/2024)   Humiliation, Afraid, Rape, and Kick questionnaire    Fear of Current or Ex-Partner: No    Emotionally Abused: No    Physically Abused: No    Sexually Abused: No     Code Status   Code Status: Full Code  Review of Systems: All systems reviewed and negative except where noted in HPI.  Physical Exam: Vital signs in last 24 hours: Temp:  [98.3 F (36.8 C)-98.9 F (37.2 C)] 98.3 F (36.8 C) (07/22 0833) Pulse Rate:  [80-102] 102 (07/22 0833) Resp:  [16-20] 16 (07/22 0833) BP: (103-146)/(47-70) 109/55 (07/22 0833) SpO2:  [93 %-97 %] 95 % (07/22 0833) Last BM Date :  06/12/24  General:  Pleasant female in NAD Psych:  Cooperative. Normal mood and affect Eyes: Pupils equal Ears:  Normal auditory acuity Nose: No deformity, discharge or lesions Neck:  Supple, no masses felt Lungs:  Clear to auscultation.  Heart:  Regular rate, regular rhythm.  Abdomen:  Soft, nondistended, nontender, active bowel sounds, no masses felt Rectal :  Deferred Msk: Symmetrical without gross deformities.  Neurologic:  Alert, oriented, grossly normal neurologically Extremities : No edema Skin:  Intact without significant lesions.    Intake/Output from previous day: 07/21 0701 - 07/22 0700 In: 381.5 [Blood:381.5] Out: -  Intake/Output this shift:  No intake/output data recorded.   Vina Dasen, NP-C   06/15/2024, 10:32 AM

## 2024-06-15 NOTE — TOC Initial Note (Addendum)
 Transition of Care [facility]) - Initial/Assessment Note    Patient Details  Name: Kristine [name] MRN: [id number] Date of Birth: [birth year]  Transition of Care [facility]) CM/SW Contact:    Kristine Honor A [place], LCSW Phone Number: 06/15/2024, 10:27 AM  Clinical Narrative:                  CSW met with pt at bedside and she stated that she was from [facility] for short term rehab.   CSW spoke with Kristine [name] with [employer], pt's able to return at discharge.   CSW will need insurance authorization to return, CSW to start once pt closer to medical stability.    TOC will continue to follow.  Expected Discharge Plan: Skilled Nursing Facility Barriers to Discharge: Continued Medical Work up, English as a second language teacher   Patient Goals and CMS Choice            Expected Discharge Plan and Services                                              Prior Living Arrangements/Services              Need for Family Participation in Patient Care: No (Comment) Care giver support system in place?: Yes (comment) (pt's daughter Powell)      Activities of Daily Living   ADL Screening (condition at time of admission) Independently performs ADLs?: No Does the patient have a NEW difficulty with bathing/dressing/toileting/self-feeding that is expected to last >3 days?: Yes (Initiates electronic notice to provider for possible OT consult) Does the patient have a NEW difficulty with getting in/out of bed, walking, or climbing stairs that is expected to last >3 days?: Yes (Initiates electronic notice to provider for possible PT consult) Does the patient have a NEW difficulty with communication that is expected to last >3 days?: No Is the patient deaf or have difficulty hearing?: Yes Does the patient have difficulty seeing, even when wearing glasses/contacts?: No Does the patient have difficulty concentrating, remembering, or making decisions?: No  Permission Sought/Granted                  Emotional  Assessment Appearance:: Appears stated age Attitude/Demeanor/Rapport: Engaged Affect (typically observed): Pleasant Orientation: : Oriented to Self, Oriented to Place, Oriented to  Time, Oriented to Situation Alcohol / Substance Use: Not Applicable Psych Involvement: No (comment)  Admission diagnosis:  Diarrhea [R19.7] Epigastric abdominal pain [R10.13] General weakness [R53.1] Acute cystitis without hematuria [N30.00] Weakness [R53.1] Patient Active Problem List   Diagnosis Date Noted   UTI (urinary tract infection) 06/13/2024   Weakness 06/13/2024   Hypotension 06/13/2024   Near syncope 06/13/2024   Diarrhea 06/13/2024   Syncope, vasovagal 06/06/2024   Acute on chronic diastolic CHF (congestive heart failure) (HCC) 06/06/2024   Dyspnea 06/05/2024   Upper respiratory tract infection 09/27/2022   Mild intermittent reactive airway disease 07/24/2022   Chronic rhinitis 07/24/2022   Vertigo 07/24/2021   Pseudophakia of left eye 05/31/2021   Pseudophakia of right eye 05/31/2021   History of osteoporosis 05/27/2021   Mononucleosis 05/27/2021   Cystoid macular edema of left eye 10/11/2020   NSTEMI (non-ST elevated myocardial infarction) (HCC) 10/09/2020   Type 2 macular telangiectasis, right 03/20/2020   Cystoid macular edema of right eye 03/20/2020   Type 2 macular telangiectasis, left 03/20/2020   OSA on CPAP 03/20/2020  Cr(e)st syndrome (HCC) 05/25/2018   Ulcer of esophagus with bleeding 02/27/2018   Psychophysiological insomnia 09/24/2017   Abnormal nuclear cardiac imaging test 05/30/2017   Abnormal stress echo    Solar lentigo 11/08/2016   Acute blood loss anemia 10/10/2015   Red blood cell antibody positive 10/04/2015   S/P coronary artery stent placement 07/12/2014   CAD (coronary artery disease), native coronary artery 06/27/2013   Hyperlipidemia 06/27/2013   Chest pain of uncertain etiology 06/26/2013   History of ST elevation myocardial infarction (STEMI)  06/24/2013   Hypertension    Great toe pain 09/11/2012   H/O foot surgery 04/09/2012   Hallux varus, acquired 02/25/2012   GERD (gastroesophageal reflux disease)    Barrett's esophagus    Cystocele    Rectocele    PCP:  Ileen Rosaline NOVAK, NP Pharmacy:   [facility]) [place], [place] - [address] [place] [place] [address] Phone: [phone] Fax: [phone]  [id number] - [place], [place] - [address] [place] [address] Phone: [phone] Fax: [phone]  [facility] Delivery - [place], [place] - [address] [place] [address] Phone: [phone] Fax: [phone]     Social Drivers of Health (SDOH) Social History: SDOH Screenings   Food Insecurity: No Food Insecurity (06/13/2024)  Housing: Low Risk  (06/13/2024)  Transportation Needs: No Transportation Needs (06/13/2024)  Utilities: Not At Risk (06/13/2024)  Depression (PHQ2-9): Low Risk  (07/24/2021)  Recent Concern: Depression (PHQ2-9) - Medium Risk (05/25/2021)  Social Connections: Moderately Integrated (06/13/2024)  Stress: Stress Concern Present (03/26/2024)   Received from [facility]  Tobacco Use: Low Risk  (06/13/2024)   SDOH Interventions:     Readmission Risk Interventions     No data to display

## 2024-06-15 NOTE — Progress Notes (Signed)
 PROGRESS NOTE    Kristine [name]  FMW:[id number] DOB: 02-02-1941 DOA: 06/13/2024 PCP: Ileen Rosaline NOVAK, NP    Brief Narrative:  83 year old with history of HTN, HLD, GERD, CAD s/p stent, CHF, OSA on CPAP, reactive airway disease, crest syndrome, achalasia admitted for diarrhea, near syncope, low blood pressure and weakness.  Patient admitted to the hospital from 7/10 - 7/18 for NSTEMI showed nonobstructive CAD complicated by respiratory failure and workup revealed persistent achalasia but eventually resumed regular diet with precautions.  Patient was eventually discharged to her facility but now coming in with hypotension, weakness and presyncope.  Upon admission noted to have metabolic alkalosis.  Upon admission she was hydrated, received PRBC transfusion and IV iron .  GI consulted for endoscopy/colonoscopy   Assessment & Plan:  Principal Problem:   Diarrhea Active Problems:   GERD (gastroesophageal reflux disease)   Hypertension   CAD (coronary artery disease), native coronary artery   Hyperlipidemia   OSA on CPAP   Cr(e)st syndrome (HCC)   S/P coronary artery stent placement   Mild intermittent reactive airway disease   UTI (urinary tract infection)   Weakness   Hypotension   Near syncope     Presyncope/hypotension Diarrhea -Unclear etiology.  Will give IV fluids at this time.  If starts having diarrhea, check GI panel and C. difficile study - Reports she had diarrhea because her facility was given excessive amount of bowel regimen.  Achalasia with significant esophageal dilation CREST syndrome -Seen on chest x-ray.  Discussed with patient, she would prefer following up outpatient with her primary gastroenterology.  She has an appointment coming up in the next 2-3 weeks.  At this time I have advised that she needs to probably have modified diet between regular and liquid diet to avoid worsening of esophageal dilation.  She understands.  EGD May 2025 at [facility] hiatal  hernia, significant esophageal dysmotility.  Abundant amount of liquid was suctioned and significant amount of food was noted to be adherent to esophageal wall.  There was evidence of gastritis.  This is secondary to her crest  Acute anemia, severe iron  deficiency - Iron  studies 06/10/2024 showed severe iron  deficiency.  Baseline hemoglobin around 9.0, following.  Hemoglobin 6.7 given 1 unit PRBC transfusion and IV iron .  At first hemoglobin improved 8.5, now 7.5.  No obvious source of bleeding.  - GI consulted for endoscopy/colonoscopy  Urinary tract infection -Urine culture sent.  Empiric IV Rocephin    Hypertension Toprol -XL.  IV as needed   Hyperlipidemia -Pravastatin    GERD - Continue home Pepcid  and PPI   Coronary artery disease -LHC on 06/07/2024 showed stable coronaries compared to prior cath in 2018.  Nonobstructive CAD and widely patent prior stents.   Reactive airway disease Bronchodilators   OSA - Continue CPAP     DVT prophylaxis:      SCD Code Status:              Full Family Communication:   Hopefully hemoglobin remained stable and we can discharge her tomorrow   Subjective: Seen at bedside, denying any nausea and vomiting.   Examination:  General exam: Appears calm and comfortable  Respiratory system: Clear to auscultation. Respiratory effort normal. Cardiovascular system: S1 & S2 heard, RRR. No JVD, murmurs, rubs, gallops or clicks. No pedal edema. Gastrointestinal system: Abdomen is nondistended, soft and nontender. No organomegaly or masses felt. Normal bowel sounds heard. Central nervous system: Alert and oriented. No focal neurological deficits. Extremities: Symmetric 5 x 5 power. Skin: No  rashes, lesions or ulcers Psychiatry: Judgement and insight appear normal. Mood & affect appropriate.                Diet Orders (From admission, onward)     Start     Ordered   06/15/24 1132  Diet clear liquid Room service appropriate? Yes; Fluid  consistency: Thin  Diet effective now       Question Answer Comment  Room service appropriate? Yes   Fluid consistency: Thin      06/15/24 1133            Objective: Vitals:   06/15/24 0047 06/15/24 0516 06/15/24 0833 06/15/24 0914  BP: 135/66 (!) 110/53 (!) 109/55   Pulse: 87 (!) 102 (!) 102 100  Resp: 20 17 16 17   Temp: 98.9 F (37.2 C) 98.7 F (37.1 C) 98.3 F (36.8 C)   TempSrc: Oral     SpO2: 93% 95% 95% 94%   No intake or output data in the 24 hours ending 06/15/24 1153 There were no vitals filed for this visit.  Scheduled Meds:  aspirin  EC  81 mg Oral Daily   famotidine   40 mg Oral QHS   feeding supplement  237 mL Oral BID BM   fluticasone  furoate-vilanterol  1 puff Inhalation Daily   metoprolol  succinate  12.5 mg Oral Daily   Na Sulfate-K Sulfate-Mg Sulfate concentrate  0.5 kit Oral Once   Followed by   Na Sulfate-K Sulfate-Mg Sulfate concentrate  0.5 kit Oral Once   pantoprazole   40 mg Oral BID AC   pravastatin   40 mg Oral QPM   simethicone   240 mg Oral Once   Followed by   simethicone   240 mg Oral Once   sodium chloride  flush  3 mL Intravenous Q12H   Continuous Infusions:  sodium chloride  75 mL/hr at 06/15/24 1125   sodium chloride      cefTRIAXone  (ROCEPHIN )  IV 1 g (06/14/24 1220)   potassium chloride  10 mEq (06/15/24 1125)    Nutritional status     There is no height or weight on file to calculate BMI.  Data Reviewed:   CBC: Recent Labs  Lab 06/10/24 0440 06/10/24 1738 06/11/24 0331 06/13/24 0932 06/13/24 1006 06/14/24 0324 06/14/24 1452 06/15/24 0422  WBC 10.9*  --  11.2* 21.1*  --  15.5*  --  16.3*  NEUTROABS 7.2  --   --  16.5*  --   --   --   --   HGB 6.9*   < > 9.3* 9.2* 9.5*  9.2* 6.7* 8.5* 7.5*  HCT 23.1*   < > 29.6* 31.3* 28.0*  27.0* 22.2* 26.4* 24.1*  MCV 76.5*  --  76.5* 84.4  --  82.2  --  85.8  PLT 189  --  168 255  --  213  --  206   < > = values in this interval not displayed.   Basic Metabolic Panel: Recent  Labs  Lab 06/10/24 0440 06/11/24 0331 06/13/24 0932 06/13/24 1006 06/14/24 0324 06/15/24 0422  NA 134* 136 137 137  137 134* 134*  K 4.0 3.4* 4.0 4.0  4.0 3.9 3.6  CL 104 106 105 105 108 106  CO2 24 23 23   --  23 22  GLUCOSE 110* 100* 132* 125* 89 117*  BUN 17 16 21 22  25* 16  CREATININE 0.53 0.65 0.61 0.60 0.65 0.61  CALCIUM  8.5* 8.2* 8.3*  --  7.8* 8.0*  MG  --  2.0  --   --   --  1.9   GFR: Estimated Creatinine Clearance: 39.6 mL/min (by C-G formula based on SCr of 0.61 mg/dL). Liver Function Tests: Recent Labs  Lab 06/13/24 0932 06/14/24 0324  AST 20 13*  ALT 16 12  ALKPHOS 43 34*  BILITOT 0.4 0.5  PROT 4.5* 3.9*  ALBUMIN 2.4* 2.0*   Recent Labs  Lab 06/13/24 0932  LIPASE 40   No results for input(s): AMMONIA in the last 168 hours. Coagulation Profile: No results for input(s): INR, PROTIME in the last 168 hours. Cardiac Enzymes: No results for input(s): CKTOTAL, CKMB, CKMBINDEX, TROPONINI in the last 168 hours. BNP (last 3 results) No results for input(s): PROBNP in the last 8760 hours. HbA1C: No results for input(s): HGBA1C in the last 72 hours. CBG: No results for input(s): GLUCAP in the last 168 hours. Lipid Profile: No results for input(s): CHOL, HDL, LDLCALC, TRIG, CHOLHDL, LDLDIRECT in the last 72 hours. Thyroid  Function Tests: No results for input(s): TSH, T4TOTAL, FREET4, T3FREE, THYROIDAB in the last 72 hours. Anemia Panel: No results for input(s): VITAMINB12, FOLATE, FERRITIN, TIBC, IRON , RETICCTPCT in the last 72 hours. Sepsis Labs: Recent Labs  Lab 06/13/24 1006 06/13/24 1328  LATICACIDVEN 2.9* 1.0    No results found for this or any previous visit (from the past 240 hours).       Radiology Studies: No results found.         LOS: 1 day   Time spent= 35 mins    Burgess JAYSON Dare, MD [facility]  If 7PM-7AM, please contact night-coverage  06/15/2024, 11:53 AM

## 2024-06-15 NOTE — Evaluation (Signed)
 Occupational Therapy Evaluation Patient Details Name: Kristine [name] MRN: [id number] DOB: 12-30-1940 Today's Date: 06/15/2024   History of Present Illness   Pt is an 83 y.o. female admitted 7/20 from [facility] for diarrhea, weakness, and low BP. Recent hospitalization 7/10-7/18 for NSTEMI. PMH: CREST syndrome, CAD, STEMI 2014, HLD, HTN, ischemic colitis, Barrett's esophagus, OSA on CPAP, OA     Clinical Impressions Pt c/o fatigue, pain to IV site, RN informed. Daughter present during session. Pt came from recent short term stay at SNF, plans to return, typically lives alone and is independent with SPC. Pt currently limited due to significant weakness and fatigue, low BP throughout session. Pt able to complete most ADLs with set up/supervision and increased time and rest breaks, min A/CGA for bed mobility and ambulation with RW for short distances. Pt would benefit from postacute rehab <3hrs/day to maximize activity tolerance until she is at mod I level to safely return home alone, will continue to see acutely to progress as able.  BP supine 117/49 HR 99 BP sitting 113/63 HR 106 BP stand 106/52 HR 114 BP stand after 3 mins 108/50 HR 106     If plan is discharge home, recommend the following:   A little help with walking and/or transfers;A little help with bathing/dressing/bathroom;Assistance with cooking/housework;Assist for transportation;Help with stairs or ramp for entrance     Functional Status Assessment   Patient has had a recent decline in their functional status and demonstrates the ability to make significant improvements in function in a reasonable and predictable amount of time.     Equipment Recommendations   None recommended by OT     Recommendations for Other Services         Precautions/Restrictions   Precautions Precautions: Fall;Other (comment) Recall of Precautions/Restrictions: Intact Precaution/Restrictions Comments: watch BP Restrictions Weight  Bearing Restrictions Per Provider Order: No     Mobility Bed Mobility Overal bed mobility: Needs Assistance Bed Mobility: Supine to Sit, Sit to Supine     Supine to sit: Min assist Sit to supine: Min assist   General bed mobility comments: min A, increased time, generalized weakness    Transfers Overall transfer level: Needs assistance Equipment used: Rolling walker (2 wheels) Transfers: Sit to/from Stand, Bed to chair/wheelchair/BSC Sit to Stand: Contact guard assist, Min assist     Step pivot transfers: Contact guard assist     General transfer comment: CGA/min A for STS, CGA for steps but fatigues quickly      Balance Overall balance assessment: Needs assistance Sitting-balance support: No upper extremity supported, Feet supported Sitting balance-Leahy Scale: Good     Standing balance support: Bilateral upper extremity supported, During functional activity, Reliant on assistive device for balance Standing balance-Leahy Scale: Poor Standing balance comment: fatigues quickly                           ADL either performed or assessed with clinical judgement   ADL Overall ADL's : Needs assistance/impaired                                       General ADL Comments: set up, supervision, increased time and rest breaks, quick to fatigue and low BP     Vision         Perception         Praxis  Pertinent Vitals/Pain Pain Assessment Pain Assessment: 0-10 Pain Score: 5  Pain Location: pain at IV site Pain Descriptors / Indicators: Discomfort Pain Intervention(s): Monitored during session     Extremity/Trunk Assessment Upper Extremity Assessment Upper Extremity Assessment: Generalized weakness   Lower Extremity Assessment Lower Extremity Assessment: Defer to PT evaluation   Cervical / Trunk Assessment Cervical / Trunk Assessment: Kyphotic   Communication Communication Communication: Impaired Factors Affecting  Communication: Hearing impaired   Cognition Arousal: Alert Behavior During Therapy: WFL for tasks assessed/performed Cognition: No apparent impairments             OT - Cognition Comments: A/O                 Following commands: Intact       Cueing  General Comments          Exercises     Shoulder Instructions      Home Living Family/patient expects to be discharged to:: Private residence Living Arrangements: Alone Available Help at Discharge: Family;Available PRN/intermittently Type of Home: House Home Access: Stairs to enter [employer] of Steps: 2 Entrance Stairs-Rails: None Home Layout: One level     Bathroom Shower/Tub: Chief Strategy Officer: Handicapped height Bathroom Accessibility: No   Home Equipment: Cane - single point;Shower seat   Additional Comments: can go stay with daughter (2-story house, able to stay on main level, 4 STE with rails, half bath downstairs)      Prior Functioning/Environment Prior Level of Function : Independent/Modified Independent             Mobility Comments: SPC at baseline ADLs Comments: pt sits to shower, manages finances and medications.    OT Problem List: Decreased strength;Decreased activity tolerance;Impaired balance (sitting and/or standing);Decreased cognition;Decreased safety awareness;Cardiopulmonary status limiting activity   OT Treatment/Interventions: Self-care/ADL training;Therapeutic exercise;DME and/or AE instruction;Therapeutic activities;Patient/family education;Cognitive remediation/compensation;Balance training      OT Goals(Current goals can be found in the care plan section)   Acute Rehab OT Goals Patient Stated Goal: to improve activity tolerance OT Goal Formulation: With patient/family Time For Goal Achievement: 06/29/24 Potential to Achieve Goals: Good   OT Frequency:  Min 2X/week    Co-evaluation              AM-PAC OT 6 Clicks Daily  Activity     Outcome Measure Help from another person eating meals?: None Help from another person taking care of personal grooming?: A Little Help from another person toileting, which includes using toliet, bedpan, or urinal?: A Little Help from another person bathing (including washing, rinsing, drying)?: A Little Help from another person to put on and taking off regular upper body clothing?: A Little Help from another person to put on and taking off regular lower body clothing?: A Little 6 Click Score: 19   End of Session Equipment Utilized During Treatment: Gait belt;Rolling walker (2 wheels) Nurse Communication: Mobility status  Activity Tolerance: Patient tolerated treatment well Patient left: in bed;with call bell/phone within reach;with bed alarm set  OT Visit Diagnosis: Unsteadiness on feet (R26.81);Muscle weakness (generalized) (M62.81);Other symptoms and signs involving cognitive function                Time: 8853-8782 OT Time Calculation (min): 31 min Charges:  OT General Charges $OT Visit: 1 Visit OT Evaluation $OT Eval Low Complexity: 1 Low OT Treatments $Self Care/Home Management : 8-22 mins  [address], OTR/L   Elouise JONELLE Bott 06/15/2024, 12:23 PM

## 2024-06-16 ENCOUNTER — Encounter (HOSPITAL_COMMUNITY)
Admission: EM | Disposition: A | Discharge status: Skilled Nursing Facility | Payer: Self-pay | Source: Skilled Nursing Facility | Attending: Internal Medicine

## 2024-06-16 ENCOUNTER — Encounter (HOSPITAL_COMMUNITY): Payer: Self-pay | Admitting: Internal Medicine

## 2024-06-16 ENCOUNTER — Inpatient Hospital Stay (HOSPITAL_COMMUNITY)

## 2024-06-16 DIAGNOSIS — K2211 Ulcer of esophagus with bleeding: Secondary | ICD-10-CM

## 2024-06-16 DIAGNOSIS — K2210 Ulcer of esophagus without bleeding: Secondary | ICD-10-CM

## 2024-06-16 DIAGNOSIS — K449 Diaphragmatic hernia without obstruction or gangrene: Secondary | ICD-10-CM

## 2024-06-16 DIAGNOSIS — I2510 Atherosclerotic heart disease of native coronary artery without angina pectoris: Secondary | ICD-10-CM

## 2024-06-16 DIAGNOSIS — K2289 Other specified disease of esophagus: Secondary | ICD-10-CM

## 2024-06-16 DIAGNOSIS — D509 Iron deficiency anemia, unspecified: Secondary | ICD-10-CM

## 2024-06-16 DIAGNOSIS — K2950 Unspecified chronic gastritis without bleeding: Secondary | ICD-10-CM

## 2024-06-16 DIAGNOSIS — R197 Diarrhea, unspecified: Secondary | ICD-10-CM | POA: Diagnosis not present

## 2024-06-16 HISTORY — PX: COLONOSCOPY: SHX5424

## 2024-06-16 HISTORY — PX: ESOPHAGOGASTRODUODENOSCOPY: SHX5428

## 2024-06-16 LAB — CBC
HCT: 31 % — ABNORMAL LOW (ref 36.0–46.0)
Hemoglobin: 10.3 g/dL — ABNORMAL LOW (ref 12.0–15.0)
MCH: 29.1 pg (ref 26.0–34.0)
MCHC: 33.2 g/dL (ref 30.0–36.0)
MCV: 87.6 fL (ref 80.0–100.0)
Platelets: 171 K/uL (ref 150–400)
RBC: 3.54 MIL/uL — ABNORMAL LOW (ref 3.87–5.11)
RDW: 18.5 % — ABNORMAL HIGH (ref 11.5–15.5)
WBC: 13.7 K/uL — ABNORMAL HIGH (ref 4.0–10.5)
nRBC: 0.7 % — ABNORMAL HIGH (ref 0.0–0.2)

## 2024-06-16 LAB — TYPE AND SCREEN
ABO/RH(D): A POS
Antibody Screen: POSITIVE
Donor AG Type: NEGATIVE
Donor AG Type: NEGATIVE
Donor AG Type: NEGATIVE
Unit division: 0
Unit division: 0
Unit division: 0

## 2024-06-16 LAB — BASIC METABOLIC PANEL WITH GFR
Anion gap: 12 (ref 5–15)
BUN: 21 mg/dL (ref 8–23)
CO2: 18 mmol/L — ABNORMAL LOW (ref 22–32)
Calcium: 7.8 mg/dL — ABNORMAL LOW (ref 8.9–10.3)
Chloride: 113 mmol/L — ABNORMAL HIGH (ref 98–111)
Creatinine, Ser: 0.55 mg/dL (ref 0.44–1.00)
GFR, Estimated: >60 mL/min (ref >60)
Glucose, Bld: 94 mg/dL (ref 70–99)
Potassium: 3.4 mmol/L — ABNORMAL LOW (ref 3.5–5.1)
Sodium: 143 mmol/L (ref 135–145)

## 2024-06-16 LAB — BPAM RBC
Blood Product Expiration Date: 202508052359
Blood Product Unit Number: 202508162359
Blood Product Unit Number: 202508162359
ISSUE DATE / TIME: 202507210636
PRODUCT CODE: 202508122359
Unit Type and Rh: 202508052359
Unit Type and Rh: 202508122359
Unit Type and Rh: 202508162359
Unit Type and Rh: 6200
Unit Type and Rh: 6200
Unit Type and Rh: 6200
Unit Type and Rh: 6200

## 2024-06-16 LAB — MAGNESIUM: Magnesium: 2.1 mg/dL (ref 1.7–2.4)

## 2024-06-16 SURGERY — COLONOSCOPY
Anesthesia: Monitor Anesthesia Care

## 2024-06-16 MED ORDER — PHENYLEPHRINE 80 MCG/ML (10ML) SYRINGE FOR IV PUSH (FOR BLOOD PRESSURE SUPPORT)
PREFILLED_SYRINGE | INTRAVENOUS | Status: DC | PRN
  Administered 2024-06-16: 80 ug via INTRAVENOUS

## 2024-06-16 MED ORDER — PROPOFOL 500 MG/50ML IV EMUL
INTRAVENOUS | Status: DC | PRN
  Administered 2024-06-16: 150 ug/kg/min via INTRAVENOUS

## 2024-06-16 MED ORDER — PROPOFOL 10 MG/ML IV BOLUS
INTRAVENOUS | Status: DC | PRN
  Administered 2024-06-16 (×2): 20 mg via INTRAVENOUS
  Administered 2024-06-16 (×2): 30 mg via INTRAVENOUS

## 2024-06-16 MED ORDER — LIDOCAINE HCL (PF) 2 % IJ SOLN
INTRAMUSCULAR | Status: DC | PRN
  Administered 2024-06-16: 30 mg via INTRADERMAL

## 2024-06-16 MED ORDER — SODIUM CHLORIDE 0.9 % IV SOLN: INTRAVENOUS | Status: DC | PRN

## 2024-06-16 NOTE — Transfer of Care (Signed)
 Immediate Anesthesia Transfer of Care Note  Patient: Kristine [name]  Procedure(s) Performed: COLONOSCOPY EGD (ESOPHAGOGASTRODUODENOSCOPY)  Patient Location: PACU  Anesthesia Type:MAC  Level of Consciousness: sedated  Airway & Oxygen Therapy: Patient Spontanous Breathing and Patient connected to nasal cannula oxygen  Post-op Assessment: Report given to RN and Post -op Vital signs reviewed and stable  Post vital signs: Reviewed and stable  Last Vitals:  Vitals Value Taken Time  BP    Temp    Pulse 82 06/16/24 13:43  Resp 17 06/16/24 13:43  SpO2 94 % 06/16/24 13:43  Vitals shown include unfiled device data.  Last Pain:  Vitals:   06/16/24 1212  TempSrc: Temporal  PainSc: 0-No pain         Complications: No notable events documented.

## 2024-06-16 NOTE — Progress Notes (Signed)
 Second  unit  of PRBc verified and  started  to pt , no any reaction or complaints . Vs wdl , AO*4 ,

## 2024-06-16 NOTE — Op Note (Signed)
 [facility] Patient Name: Kristine [name] Procedure Date : 06/16/2024 MRN: [id number] Attending MD: Gustav ALONSO Mcgee , MD, [id number] Date of Birth: [birth year] CSN: [id number] Age: 83 Admit Type: Inpatient Procedure:                Colonoscopy Indications:              Unexplained iron  deficiency anemia Providers:                Gustav ALONSO Mcgee, MD, Almarie Masters, RN,                            [facility] [place], Technician Referring MD:              Medicines:                Monitored Anesthesia Care Complications:            No immediate complications. Estimated Blood Loss:     Estimated blood loss was minimal. Procedure:                Pre-Anesthesia Assessment:                           - Prior to the procedure, a History and Physical                            was performed, and patient medications and                            allergies were reviewed. The patient's tolerance of                            previous anesthesia was also reviewed. The risks                            and benefits of the procedure and the sedation                            options and risks were discussed with the patient.                            All questions were answered, and informed consent                            was obtained. Prior Anticoagulants: The patient has                            taken no anticoagulant or antiplatelet agents. ASA                            Grade Assessment: III - A patient with severe                            systemic disease. After reviewing the risks and  benefits, the patient was deemed in satisfactory                            condition to undergo the procedure.                           After obtaining informed consent, the colonoscope                            was passed under direct vision. Throughout the                            procedure, the patient's blood pressure, pulse, and                             oxygen saturations were monitored continuously. The                            [id number]-HQ190TL ([id number] Olympus peds colonoscope was                            introduced through the anus with the intention of                            advancing to the cecum. The scope was advanced to                            the rectum before the procedure was aborted.                            Medications were given. The colonoscopy was                            technically difficult and complex due to poor bowel                            prep with stool present. The patient tolerated the                            procedure well. The quality of the bowel                            preparation was poor. Scope In: 1:35:35 PM Scope Out: 1:36:18 PM Total Procedure Duration: 0 hours 0 minutes 43 seconds  Findings:      The perianal and digital rectal examinations were normal.      Copious quantities of stool was found in the rectum, precluding       visualization. Aborted procedure Impression:               - Preparation of the colon was poor.                           - Stool in the rectum. aborted                           -  No specimens collected. Recommendation:           - Resume previous diet.                           - Continue present medications. Procedure Code(s):        --- Professional ---                           [phone], 53, Colonoscopy, flexible; diagnostic,                            including collection of specimen(s) by brushing or                            washing, when performed (separate procedure) Diagnosis Code(s):        --- Professional ---                           D50.9, Iron  deficiency anemia, unspecified CPT copyright 2022 [employer]. All rights reserved. The codes documented in this report are preliminary and upon coder review may  be revised to meet current compliance requirements. Tuere Nwosu V. Margaretmary Prisk, MD 06/16/2024 2:59:11 PM This report has been  signed electronically. Number of Addenda: 0

## 2024-06-16 NOTE — Progress Notes (Addendum)
 1 unit PRBc given to pt , no any reaction or complaints . Vs wdl , AO*4 , second unit ready for transfusion ,

## 2024-06-16 NOTE — Anesthesia Postprocedure Evaluation (Signed)
 Anesthesia Post Note  Patient: Kristine [name]  Procedure(s) Performed: COLONOSCOPY EGD (ESOPHAGOGASTRODUODENOSCOPY)     Patient location during evaluation: PACU Anesthesia Type: MAC Level of consciousness: awake Pain management: pain level controlled Vital Signs Assessment: post-procedure vital signs reviewed and stable Respiratory status: spontaneous breathing, nonlabored ventilation and respiratory function stable Cardiovascular status: stable and blood pressure returned to baseline Postop Assessment: no apparent nausea or vomiting Anesthetic complications: no   No notable events documented.  Last Vitals:  Vitals:   06/16/24 1420 06/16/24 1430  BP: (!) 118/57 119/63  Pulse: 75 75  Resp: 17 15  Temp:    SpO2: 98% 100%    Last Pain:  Vitals:   06/16/24 1430  TempSrc:   PainSc: 0-No pain                 Delon Aisha Arch

## 2024-06-16 NOTE — Progress Notes (Signed)
 PT Cancellation Note  Patient Details Name: Kristine [name] MRN: [id number] DOB: [birth year]   Cancelled Treatment:    Reason Eval/Treat Not Completed: (P) Patient at procedure or test/unavailable (Pt off unit (endoscopy) for procedure.  Will defer PT at this time.)   Quintavius Niebuhr J Adanna Zuckerman 06/16/2024, 2:05 PM  Toya HAMS , PTA Acute Rehabilitation Services Office [phone]

## 2024-06-16 NOTE — Progress Notes (Addendum)
 1 RBCS verified  started to pt  , vs taken and recorded , no any reactions , pt AO*4 ,still frequent melena , pt NPO

## 2024-06-16 NOTE — Plan of Care (Signed)

## 2024-06-16 NOTE — Anesthesia Preprocedure Evaluation (Addendum)
 Anesthesia Evaluation  Patient identified by MRN, date of birth, ID band Patient awake    Reviewed: Allergy  & Precautions, NPO status , Patient's Chart, lab work & pertinent test results  History of Anesthesia Complications Negative for: history of anesthetic complications  Airway Mallampati: II  TM Distance: >3 FB Neck ROM: Full    Dental  (+) Dental Advisory Given Magnetic bridge top and bottom, removed:   Pulmonary asthma , sleep apnea , COPD,  COPD inhaler, neg recent URI   Pulmonary exam normal breath sounds clear to auscultation       Cardiovascular METS: metoprolol . hypertension, Pt. on home beta blockers (-) angina + CAD, + Past MI, + Cardiac Stents and +CHF  (-) CABG  Rhythm:Regular Rate:Normal  HLD, Raynaud's  LHC 06/07/2024: Conclusions: 1. Stable appearance of coronary arteries compared with last catheterization in 2018; there is mild-moderate, non-obstructive coronary artery disease involving the mid/distal LAD, mid LCx, and proximal/mid LAD. 2. Widely patent overlapping LCx/OM1 stents. 3. Normal left ventricular filling pressure (LVEDP 10 mmHg).  TTE 06/07/2024: IMPRESSIONS    1. Left ventricular ejection fraction, by estimation, is 50 to 55%. The  left ventricle has low normal function. The left ventricle demonstrates  regional wall motion abnormalities (see scoring diagram/findings for  description). Left ventricular diastolic   parameters were normal. The average left ventricular global longitudinal  strain is -14.3 %. The global longitudinal strain is abnormal.   2. Right ventricular systolic function is normal. The right ventricular  size is normal. There is normal pulmonary artery systolic pressure. The  estimated right ventricular systolic pressure is 21.0 mmHg.   3. The mitral valve is grossly normal. No evidence of mitral valve  regurgitation. No evidence of mitral stenosis.   4. The aortic valve is  tricuspid. Aortic valve regurgitation is mild.  Aortic valve sclerosis is present, with no evidence of aortic valve  stenosis.   5. The inferior vena cava is normal in size with greater than 50%  respiratory variability, suggesting right atrial pressure of 3 mmHg.     Neuro/Psych neg Seizures PSYCHIATRIC DISORDERS      Vertigo     GI/Hepatic Neg liver ROS, PUD,GERD (Barrett's esophagus)  Medicated,,S/p Nissen   Endo/Other  negative endocrine ROS    Renal/GU negative Renal ROS     Musculoskeletal  (+) Arthritis ,  Osteoporosis    Abdominal   Peds  Hematology  (+) Blood dyscrasia, anemia Lab Results      Component                Value               Date                      WBC                      13.7 (H)            06/16/2024                HGB                      10.3 (L)            06/16/2024                HCT  31.0 (L)            06/16/2024                MCV                      87.6                06/16/2024                PLT                      171                 06/16/2024                 Anesthesia Other Findings 83 year old female with history of hypertension, hyperlipidemia, GERD, coronary disease status post stenting, CHF, OSA on CPAP, reactive airway disease, CREST syndrome, achalasia who initially presented with complaint of diarrhea, near syncope, low blood pressure and weakness.  She was recently admitted earlier this month for an NSTEMI, CAD cath showed nonobstructive coronary artery disease.  She was eventually discharged to her facility but now presented again with low blood pressure, weakness, presyncope.  Hemoglobin was in the range of 9 on presentation.  GI consulted.  Plan for EGD/colonoscopy.  Last night she had a syncopal episode while inpatient with black tarry stools, hemoglobin was found to be 5.7, given IV fluids, transfused with 2 units of PRBC.  Reproductive/Obstetrics                               Anesthesia Physical Anesthesia Plan  ASA: 4  Anesthesia Plan: MAC   Post-op Pain Management: Minimal or no pain anticipated   Induction: Intravenous  PONV Risk Score and Plan: 2 and Propofol  infusion, TIVA and Treatment may vary due to age or medical condition  Airway Management Planned: Natural Airway and Nasal Cannula  Additional Equipment:   Intra-op Plan:   Post-operative Plan:   Informed Consent: I have reviewed the patients History and Physical, chart, labs and discussed the procedure including the risks, benefits and alternatives for the proposed anesthesia with the patient or authorized representative who has indicated his/her understanding and acceptance.     Dental advisory given  Plan Discussed with: CRNA and Anesthesiologist  Anesthesia Plan Comments: (Discussed with patient risks of MAC including, but not limited to, minor pain or discomfort, hearing people in the room, and possible need for backup general anesthesia. Risks for general anesthesia also discussed including, but not limited to, sore throat, hoarse voice, chipped/damaged teeth, injury to vocal cords, nausea and vomiting, allergic reactions, lung infection, heart attack, stroke, and death. All questions answered. )         Anesthesia Quick Evaluation

## 2024-06-16 NOTE — Interval H&P Note (Signed)
 History and Physical Interval Note:  06/16/2024 12:59 PM  Kristine [name]  has presented today for surgery, with the diagnosis of Iron  deficiency anemia.  The various methods of treatment have been discussed with the patient and family. After consideration of risks, benefits and other options for treatment, the patient has consented to  Procedure(s): COLONOSCOPY (N/A) EGD (ESOPHAGOGASTRODUODENOSCOPY) (N/A) as a surgical intervention.  The patient's history has been reviewed, patient examined, no change in status, stable for surgery.  I have reviewed the patient's chart and labs.  Questions were answered to the patient's satisfaction.     [name]

## 2024-06-16 NOTE — Op Note (Signed)
 [facility] Patient Name: Kristine [name] Procedure Date : 06/16/2024 MRN: [id number] Attending MD: Gustav ALONSO Mcgee , MD, [id number] Date of Birth: 1941/03/04 CSN: [id number] Age: 83 Admit Type: Inpatient Procedure:                Upper GI endoscopy Indications:              Suspected upper gastrointestinal bleeding,                            Gastrointestinal bleeding of unknown origin,                            Suspected upper gastrointestinal bleeding in                            patient with unexplained iron  deficiency anemia Providers:                Gustav ALONSO Mcgee, MD, Almarie Masters, RN,                            Jasmine Petiford, Technician, Gregoria Pierce,                            RN, Felice Sar, Technician Referring MD:              Medicines:                Monitored Anesthesia Care Complications:            No immediate complications. Estimated Blood Loss:     Estimated blood loss was minimal. Procedure:                Pre-Anesthesia Assessment:                           - Prior to the procedure, a History and Physical                            was performed, and patient medications and                            allergies were reviewed. The patient's tolerance of                            previous anesthesia was also reviewed. The risks                            and benefits of the procedure and the sedation                            options and risks were discussed with the patient.                            All questions were answered, and informed consent                            was  obtained. Prior Anticoagulants: The patient has                            taken no anticoagulant or antiplatelet agents. ASA                            Grade Assessment: III - A patient with severe                            systemic disease. After reviewing the risks and                            benefits, the patient was deemed in satisfactory                             condition to undergo the procedure.                           After obtaining informed consent, the endoscope was                            passed under direct vision. Throughout the                            procedure, the patient's blood pressure, pulse, and                            oxygen saturations were monitored continuously. The                            [id number]-[id number] ([id number] Olympus endoscope was introduced                            through the mouth, and advanced to the second part                            of duodenum. The upper GI endoscopy was technically                            difficult and complex due to presence of bezoar.                            The patient tolerated the procedure well. Scope In: Scope Out: Findings:      The lumen of the esophagus was severely dilated.      Fluid and pill bezoar was found in the entire esophagus. Suctioned       through scope and lavage of the area was performed, resulting in       clearance with adequate visualization.      Few cratered and superficial esophageal ulcers were found 25 to 32 cm       from the incisors. The largest lesion was 10 mm in largest dimension.       Biopsies were taken with a cold forceps for histology.      Patchy mucosal changes  characterized by friability (with contact       bleeding), nodularity, altered texture, an increased vascular pattern       and ulceration were found in the lower third of the esophagus with long       segment Barrett's 25-32cm from incissors. Biopsies were taken with a       cold forceps for histology.      A 4 cm hiatal hernia was present.      Patchy mild inflammation characterized by congestion (edema) and       erythema was found in the cardia. Biopsies were taken with a cold       forceps for histology.      The examined duodenum was normal. Impression:               - Dilation in the entire esophagus.                           - Fluid in the  esophagus.                           - Esophageal ulcers. Biopsied.                           - Friable (with contact bleeding), nodular, texture                            changed, increased vascular pattern, ulcerated,                            Barrett's mucosa in the esophagus. Biopsied.                           - 4 cm hiatal hernia.                           - Gastritis. Biopsied.                           - Normal examined duodenum. Recommendation:           - Mechanical soft diet.                           - Continue present medications.                           - Follow an antireflux regimen.                           - Use Protonix  (pantoprazole ) 40 mg PO BID.                           - Use sucralfate  suspension 1 gram PO QID.                           - Await pathology results. Procedure Code(s):        --- Professional ---  56760, Esophagogastroduodenoscopy, flexible,                            transoral; with biopsy, single or multiple Diagnosis Code(s):        --- Professional ---                           K22.89, Other specified disease of esophagus                           K22.10, Ulcer of esophagus without bleeding                           K22.11, Ulcer of esophagus with bleeding                           K44.9, Diaphragmatic hernia without obstruction or                            gangrene                           K29.70, Gastritis, unspecified, without bleeding                           K92.2, Gastrointestinal hemorrhage, unspecified                           D50.9, Iron  deficiency anemia, unspecified CPT copyright 2022 American Medical Association. All rights reserved. The codes documented in this report are preliminary and upon coder review may  be revised to meet current compliance requirements. Nylia Gavina V. Gerald Honea, MD 06/16/2024 2:35:45 PM This report has been signed electronically. Number of Addenda: 0

## 2024-06-16 NOTE — Progress Notes (Signed)
 PROGRESS NOTE  Kristine [name]  FMW:[id number] DOB: [birth year] DOA: 06/13/2024 PCP: Ileen Rosaline NOVAK, NP   Brief Narrative: Patient is 83 year old female with history of hypertension, hyperlipidemia, GERD, coronary disease status post stenting, CHF, OSA on CPAP, reactive airway disease, CREST syndrome, achalasia who initially presented with complaint of diarrhea, near syncope, low blood pressure and weakness.  She was recently admitted earlier this month for an NSTEMI, CAD cath showed nonobstructive coronary artery disease.  She was eventually discharged to her facility but now presented again with low blood pressure, weakness, presyncope.  Hemoglobin was in the range of 9 on presentation.  GI consulted.  Plan for EGD/colonoscopy.  Last night she had a syncopal episode while inpatient with black tarry stools, hemoglobin was found to be 5.7, given IV fluids, transfused with 2 units of PRBC.  Assessment & Plan:  Principal Problem:   Diarrhea Active Problems:   GERD (gastroesophageal reflux disease)   Hypertension   CAD (coronary artery disease), native coronary artery   Hyperlipidemia   OSA on CPAP   Cr(e)st syndrome (HCC)   S/P coronary artery stent placement   Mild intermittent reactive airway disease   UTI (urinary tract infection)   Weakness   Hypotension   Near syncope   Epigastric abdominal pain   Iron  deficiency anemia due to chronic blood loss   Acute blood loss anemia/severe iron  deficiency: Presented with hypotension, anemia,   Iron  studies show severe iron  deficiency.  Baseline hemoglobin around 9, dropped to the range of 5 here, given total of 2 units of blood transfusion, also given IV iron .  Continue to monitor H&H.  GI already following and plan for endoscopy/colonoscopy.  Hemoglobin this morning stable in the range of 10.  She got any bloody stools or black stools at rehab.  Presyncope/hypotension: Hypotension secondary to acute blood loss anemia.  Continue to monitor blood  pressure.  Given IV fluid.  Reported to be on bowel regimen at the rehab.  Antihypertensives on hold.  Blood pressure stable this morning.  History of achalasia/GERD syndrome: Follows with gastroenterology.  GI already following her here.  EGD on May 2025 showed large hiatal hernia, significant esophageal dysmotility  Suspected UTI: Currently on ceftriaxone , urine culture showed insignificant growth.  Will discontinue antibiotic  Hyperlipidemia: Statin  GERD: On Pepcid , PPI  Hypokalemia: Will supplement with potassium.  Coronary artery disease: Recently admitted for NSTEMI.  Underwent cardiac cath.  LHC on 7/14 showed stable coronaries compared to prior cath in 2018, nonobstructive coronary artery disease, widely patent prior stents  Reactive airway disease: On bronchodilators at home  OSA: On CPAP         DVT prophylaxis:Place and maintain sequential compression device Start: 06/14/24 0457 Place TED hose Start: 06/14/24 0457     Code Status: Full Code  Family Communication: Daughter at bedside  Patient status:Inpatient  Patient is from :Home  Anticipated discharge un:Ynfz  Estimated DC date:1-2 days   Consultants: GI  Procedures:None yet  Antimicrobials:  Anti-infectives (From admission, onward)    Start     Dose/Rate Route Frequency Ordered Stop   06/14/24 1000  cefTRIAXone  (ROCEPHIN ) 1 g in sodium chloride  0.9 % 100 mL IVPB        1 g 200 mL/hr over 30 Minutes Intravenous Every 24 hours 06/13/24 1549     06/13/24 1500  cefTRIAXone  (ROCEPHIN ) 1 g in sodium chloride  0.9 % 100 mL IVPB        1 g 200 mL/hr over 30 Minutes Intravenous  Once 06/13/24 1449 06/13/24 1531       Subjective: Patient seen and examined at bedside today.  Hemodynamically stable.  Continues of blood transfusion overnight.  Comfortable this morning ,doing bowel prep for upcoming colonoscopy.  Denies any abdominal pain, nausea or vomiting  Objective: Vitals:   06/16/24 0345 06/16/24  0359 06/16/24 0416 06/16/24 0705  BP: 121/62 121/62 117/62 129/62  Pulse: 96 92 95 85  Resp: 17 16 17 17   Temp: 98.8 F (37.1 C) 98.8 F (37.1 C) 98.4 F (36.9 C) 98.9 F (37.2 C)  TempSrc: Oral   Axillary  SpO2: 97% 96%  98%    Intake/Output Summary (Last 24 hours) at 06/16/2024 0732 Last data filed at 06/16/2024 9294 Gross per 24 hour  Intake 4822.38 ml  Output --  Net 4822.38 ml   There were no vitals filed for this visit.  Examination:  General exam: Overall comfortable, not in distress, pleasant elderly female HEENT: PERRL Respiratory system:  no wheezes or crackles  Cardiovascular system: S1 & S2 heard, RRR.  Gastrointestinal system: Abdomen is nondistended, soft and nontender. Central nervous system: Alert and oriented Extremities: No edema, no clubbing ,no cyanosis Skin: No rashes, no ulcers,no icterus     Data Reviewed: I have personally reviewed following labs and imaging studies  CBC: Recent Labs  Lab 06/10/24 0440 06/10/24 1738 06/11/24 0331 06/13/24 0932 06/13/24 1006 06/14/24 0324 06/14/24 1452 06/15/24 0422 06/15/24 2021 06/15/24 2157  WBC 10.9*  --  11.2* 21.1*  --  15.5*  --  16.3*  --   --   NEUTROABS 7.2  --   --  16.5*  --   --   --   --   --   --   HGB 6.9*   < > 9.3* 9.2*   < > 6.7* 8.5* 7.5* 5.7* 5.9*  HCT 23.1*   < > 29.6* 31.3*   < > 22.2* 26.4* 24.1* 18.4* 19.8*  MCV 76.5*  --  76.5* 84.4  --  82.2  --  85.8  --   --   PLT 189  --  168 255  --  213  --  206  --   --    < > = values in this interval not displayed.   Basic Metabolic Panel: Recent Labs  Lab 06/11/24 0331 06/13/24 0932 06/13/24 1006 06/14/24 0324 06/15/24 0422 06/15/24 2157  NA 136 137 137  137 134* 134* 139  K 3.4* 4.0 4.0  4.0 3.9 3.6 4.0  CL 106 105 105 108 106 111  CO2 23 23  --  23 22 21*  GLUCOSE 100* 132* 125* 89 117* 117*  BUN 16 21 22  25* 16 23  CREATININE 0.65 0.61 0.60 0.65 0.61 0.56  CALCIUM  8.2* 8.3*  --  7.8* 8.0* 7.2*  MG 2.0  --   --   --   1.9  --      Recent Results (from the past 240 hours)  Urine Culture (for pregnant, neutropenic or urologic patients or patients with an indwelling urinary catheter)     Status: Abnormal   Collection Time: 06/13/24  1:29 PM   Specimen: Urine, Clean Catch  Result Value Ref Range Status   Specimen Description URINE, CLEAN CATCH  Final   Special Requests NONE  Final   Culture (A)  Final    <10,000 COLONIES/mL INSIGNIFICANT GROWTH Performed at [facility], [address], [place], [place] [address]    Report Status  06/15/2024 FINAL  Final     Radiology Studies: No results found.  Scheduled Meds:  aspirin  EC  81 mg Oral Daily   feeding supplement  237 mL Oral BID BM   fluticasone  furoate-vilanterol  1 puff Inhalation Daily   pantoprazole  (PROTONIX ) IV  40 mg Intravenous Q12H   pravastatin   40 mg Oral QPM   sodium chloride  flush  3 mL Intravenous Q12H   Continuous Infusions:  sodium chloride  Stopped (06/16/24 0100)   sodium chloride      cefTRIAXone  (ROCEPHIN )  IV Stopped (06/15/24 1249)     LOS: 2 days   Ivonne Mustache, MD [facility] P7/23/2025, 7:32 AM

## 2024-06-16 NOTE — Progress Notes (Signed)
 Second unit PRBc given to pt , no any reaction or complaints . Vs wdl , AO*4 , vs wdl

## 2024-06-17 DIAGNOSIS — K921 Melena: Secondary | ICD-10-CM

## 2024-06-17 DIAGNOSIS — R197 Diarrhea, unspecified: Secondary | ICD-10-CM | POA: Diagnosis not present

## 2024-06-17 LAB — CBC
HCT: 28.3 % — ABNORMAL LOW (ref 36.0–46.0)
Hemoglobin: 9.2 g/dL — ABNORMAL LOW (ref 12.0–15.0)
MCH: 29.1 pg (ref 26.0–34.0)
MCHC: 32.5 g/dL (ref 30.0–36.0)
MCV: 89.6 fL (ref 80.0–100.0)
Platelets: 164 K/uL (ref 150–400)
RBC: 3.16 MIL/uL — ABNORMAL LOW (ref 3.87–5.11)
RDW: 19.9 % — ABNORMAL HIGH (ref 11.5–15.5)
WBC: 14.9 K/uL — ABNORMAL HIGH (ref 4.0–10.5)
nRBC: 0.2 % (ref 0.0–0.2)

## 2024-06-17 LAB — BASIC METABOLIC PANEL WITH GFR
Anion gap: 6 (ref 5–15)
BUN: 13 mg/dL (ref 8–23)
CO2: 19 mmol/L — ABNORMAL LOW (ref 22–32)
Calcium: 7.7 mg/dL — ABNORMAL LOW (ref 8.9–10.3)
Chloride: 111 mmol/L (ref 98–111)
Creatinine, Ser: 0.57 mg/dL (ref 0.44–1.00)
GFR, Estimated: >60 mL/min (ref >60)
Glucose, Bld: 98 mg/dL (ref 70–99)
Potassium: 3.3 mmol/L — ABNORMAL LOW (ref 3.5–5.1)
Sodium: 136 mmol/L (ref 135–145)

## 2024-06-17 LAB — MAGNESIUM: Magnesium: 1.8 mg/dL (ref 1.7–2.4)

## 2024-06-17 MED ORDER — METOPROLOL SUCCINATE ER 25 MG PO TB24
12.5000 mg | ORAL_TABLET | Freq: Every day | ORAL | Status: DC
  Administered 2024-06-17 – 2024-06-18 (×2): 12.5 mg via ORAL
  Filled 2024-06-17 (×2): qty 1

## 2024-06-17 MED ORDER — PANTOPRAZOLE SODIUM 40 MG PO TBEC
40.0000 mg | DELAYED_RELEASE_TABLET | Freq: Two times a day (BID) | ORAL | Status: DC
  Administered 2024-06-17 – 2024-06-18 (×2): 40 mg via ORAL
  Filled 2024-06-17 (×2): qty 1

## 2024-06-17 MED ORDER — FERROUS SULFATE 325 (65 FE) MG PO TABS
325.0000 mg | ORAL_TABLET | Freq: Every day | ORAL | Status: DC
  Administered 2024-06-18: 325 mg via ORAL
  Filled 2024-06-17: qty 1

## 2024-06-17 MED ORDER — POTASSIUM CHLORIDE CRYS ER 20 MEQ PO TBCR
40.0000 meq | EXTENDED_RELEASE_TABLET | Freq: Once | ORAL | Status: AC
  Administered 2024-06-17: 40 meq via ORAL
  Filled 2024-06-17: qty 2

## 2024-06-17 MED ORDER — SUCRALFATE 1 G PO TABS
1.0000 g | ORAL_TABLET | Freq: Three times a day (TID) | ORAL | Status: DC
  Administered 2024-06-17 – 2024-06-18 (×3): 1 g via ORAL
  Filled 2024-06-17 (×3): qty 1

## 2024-06-17 NOTE — Progress Notes (Signed)
 Physical Therapy Treatment Patient Details Name: Kristine [name] MRN: [id number] DOB: 1941/05/24 Today's Date: 06/17/2024   History of Present Illness Pt is an 83 y.o. female admitted 7/20 from [facility] for diarrhea, weakness, and low BP. Recent hospitalization 7/10-7/18 for NSTEMI. Pt had EGD/Colonoscopy 7/23, EGD showing esophageal ulcers and gastritis, biopsied. Colonoscopy aborted due to poor preparation. PMH: CREST syndrome, CAD, STEMI 2014, HLD, HTN, ischemic colitis, Barrett's esophagus, OSA on CPAP, OA    PT Comments  Pt received in supine, anxious at prospect of mobility due to fear of falls but pleasantly cooperative, needing repetition of cues frequently and HoH. Pt able to perform bed mobility and transfers with up to minA (using cane which is her baseline device) and up to CGA for transfers/gait with RW. Emphasis on supine/seated/standing exercises for strengthening/endurance building, gait safety and activity pacing/self monitoring for symptoms. Orthostatic BP readings taken and negative, RN notified. Pt daughter asking to speak with MD about medical course/plan, RN notified. Patient will benefit from continued inpatient follow up therapy, <3 hours/day. Plan to work on stair negotiation and gait progression next session if able.     If plan is discharge home, recommend the following: A little help with walking and/or transfers;A little help with bathing/dressing/bathroom;Assistance with cooking/housework;Assist for transportation;Help with stairs or ramp for entrance   Can travel by private vehicle     Yes  Equipment Recommendations  Rolling walker (2 wheels);BSC/3in1 (pt asking about a rollator, will need to trial next session)    Recommendations for Other Services       Precautions / Restrictions Precautions Precautions: Fall;Other (comment) Recall of Precautions/Restrictions: Intact Precaution/Restrictions Comments: Enteric precs; watch BP, recent syncopal episode and  esoph ulcers/low Hgb Restrictions Weight Bearing Restrictions Per Provider Order: No     Mobility  Bed Mobility Overal bed mobility: Needs Assistance Bed Mobility: Supine to Sit     Supine to sit: Min assist     General bed mobility comments: to long sitting with rail and minA, then rotating hips to EOB with CGA, minA for anterior scooting to foot flat with bed pad assist.    Transfers Overall transfer level: Needs assistance Equipment used: Rolling walker (2 wheels), Straight cane Transfers: Sit to/from Stand, Bed to chair/wheelchair/BSC Sit to Stand: Contact guard assist, Min assist   Step pivot transfers: Min assist       General transfer comment: EOB<>cane and step pivot to chair using cane with minA (pt holding cane in RUE), then from chair<>RW with CGA x2 trials    Ambulation/Gait Ambulation/Gait assistance: Contact guard assist Gait Distance (Feet): 50 Feet (standing rest break at 18ft to wash her hands) Assistive device: Rolling walker (2 wheels) Gait Pattern/deviations: Step-through pattern, Decreased stride length, Trunk flexed Gait velocity: decreased     General Gait Details: cues for proximity to RW, forward gaze, activity pacing/energy conservation.   Stairs             Wheelchair Mobility     Tilt Bed    Modified Rankin (Stroke Patients Only)       Balance Overall balance assessment: Needs assistance Sitting-balance support: No upper extremity supported, Feet supported Sitting balance-Leahy Scale: Good     Standing balance support: Bilateral upper extremity supported, During functional activity, Reliant on assistive device for balance, Single extremity supported Standing balance-Leahy Scale: Poor Standing balance comment: minA with cane for static standing; CGA with BUE support  Communication Communication Communication: Impaired Factors Affecting Communication: Hearing impaired  Cognition  Arousal: Alert Behavior During Therapy: WFL for tasks assessed/performed   PT - Cognitive impairments: Problem solving, Memory                       PT - Cognition Comments: Improving cognition but can be slow to process and also HoH may affect comprehension, pt needing some redirection to task. Pleasantly cooperative. Following commands: Intact Following commands impaired: Follows one step commands with increased time, Follows multi-step commands inconsistently    Cueing Cueing Techniques: Verbal cues, Gestural cues  Exercises General Exercises - Lower Extremity Ankle Circles/Pumps: AROM, Both, 10 reps, Supine Heel Slides: AROM, Both, 10 reps, Supine Hip Flexion/Marching: AROM, Both, 5 reps, Standing (wtih cane and minA)    General Comments General comments (skin integrity, edema, etc.): pt c/o irritation at cleft of buttocks, briefs had been damp so changed while pt standing and pt able to perform standing peri-care and placed her own barrier ointment with set-up assist from PTA. New briefs donned wtih some assist by PTA for stability and pt using RW for support. Pt able to wash hands at sink with anterior support from countertop and CGA to supervision. Orthostatics taken and negative. Vital Signs  Patient Position (if appropriate) Orthostatic Vitals  Orthostatic Lying   BP- Lying 126/62  Pulse- Lying 92  Orthostatic Sitting  BP- Sitting 140/71  Pulse- Sitting 94  Orthostatic Standing at 0 minutes  BP- Standing at 0 minutes 131/72  Pulse- Standing at 0 minutes 100  Orthostatic Standing at 3 minutes  BP- Standing at 3 minutes 143/78  Pulse- Standing at 3 minutes 106       Pertinent Vitals/Pain Pain Assessment Pain Assessment: Faces Faces Pain Scale: Hurts a little bit Pain Location: bottom where skin irritated (pt wears diaper briefs) Pain Descriptors / Indicators: Discomfort, Grimacing Pain Intervention(s): Limited activity within patient's tolerance, Monitored  during session, Repositioned    Home Living                          Prior Function            PT Goals (current goals can now be found in the care plan section) Acute Rehab PT Goals PT Goal Formulation: With patient Time For Goal Achievement: 06/29/24 Progress towards PT goals: Progressing toward goals    Frequency    Min 2X/week      PT Plan      Co-evaluation              AM-PAC PT 6 Clicks Mobility   Outcome Measure  Help needed turning from your back to your side while in a flat bed without using bedrails?: A Little Help needed moving from lying on your back to sitting on the side of a flat bed without using bedrails?: A Little Help needed moving to and from a bed to a chair (including a wheelchair)?: A Little Help needed standing up from a chair using your arms (e.g., wheelchair or bedside chair)?: A Little Help needed to walk in hospital room?: A Little Help needed climbing 3-5 steps with a railing? : Total 6 Click Score: 16    End of Session Equipment Utilized During Treatment: Gait belt Activity Tolerance: Patient tolerated treatment well Patient left: in chair;with call bell/phone within reach;with chair alarm set;with family/visitor present;Other (comment) (daughter present; RN notified of pt BP WFL and c/o pain  in buttocks) Nurse Communication: Mobility status PT Visit Diagnosis: Unsteadiness on feet (R26.81);Other abnormalities of gait and mobility (R26.89);Muscle weakness (generalized) (M62.81)     Time: 8869-8787 PT Time Calculation (min) (ACUTE ONLY): 42 min  Charges:    $Gait Training: 8-22 mins $Therapeutic Exercise: 8-22 mins $Therapeutic Activity: 8-22 mins PT General Charges $$ ACUTE PT VISIT: 1 Visit                     Breyon Blass P., PTA Acute Rehabilitation Services Secure Chat Preferred 9a-5:30pm Office: [phone]    [facility] 06/17/2024, 12:48 PM

## 2024-06-17 NOTE — TOC Progression Note (Signed)
 Transition of Care [facility]) - Progression Note    Patient Details  Name: Kristine [name] MRN: [id number] Date of Birth: May 19, 1941  Transition of Care [facility]) CM/SW Contact  Aliviana Burdell A [place], LCSW Phone Number: 06/17/2024, 2:14 PM  Clinical Narrative:     CSW reached out to [facility] regarding pt's possible DC in coming days and resent pt's FL2 and initial referral.   CSW started insurance authorization, status pending.   Reference ID: [id number]   TOC will continue to follow.    Expected Discharge Plan: Skilled Nursing Facility Barriers to Discharge: Continued Medical Work up, English as a second language teacher               Expected Discharge Plan and Services                                               Social Drivers of Health (SDOH) Interventions SDOH Screenings   Food Insecurity: No Food Insecurity (06/13/2024)  Housing: Low Risk  (06/13/2024)  Transportation Needs: No Transportation Needs (06/13/2024)  Utilities: Not At Risk (06/13/2024)  Depression (PHQ2-9): Low Risk  (07/24/2021)  Recent Concern: Depression (PHQ2-9) - Medium Risk (05/25/2021)  Social Connections: Moderately Integrated (06/13/2024)  Stress: Stress Concern Present (03/26/2024)   Received from [facility]  Tobacco Use: Low Risk  (06/16/2024)    Readmission Risk Interventions     No data to display

## 2024-06-17 NOTE — [place] FL2 (Signed)
   MEDICAID FL2 LEVEL OF CARE FORM     IDENTIFICATION  Patient Name: Kristine [name] Birthdate: 04-07-1941 Sex: female Admission Date (Current Location): 06/13/2024  [facility] and [place] Number:  Producer, television/film/video and Address:  [facility] [place], [address], [place], [place] [address]      Provider Number: [id number]  Attending Physician Name and Address:  Jillian Buttery, MD  Relative Name and Phone Number:  Burnetta Moats (Daughter)  [phone]    Current Level of Care: Hospital Recommended Level of Care: Skilled Nursing Facility Prior Approval Number:    Date Approved/Denied:   PASRR Number: [id number]  Discharge Plan: SNF    Current Diagnoses: Patient Active Problem List   Diagnosis Date Noted   Melena 06/17/2024   Epigastric abdominal pain 06/15/2024   Iron  deficiency anemia due to chronic blood loss 06/15/2024   UTI (urinary tract infection) 06/13/2024   Weakness 06/13/2024   Hypotension 06/13/2024   Near syncope 06/13/2024   Diarrhea 06/13/2024   Syncope, vasovagal 06/06/2024   Acute on chronic diastolic CHF (congestive heart failure) (HCC) 06/06/2024   Dyspnea 06/05/2024   Upper respiratory tract infection 09/27/2022   Mild intermittent reactive airway disease 07/24/2022   Chronic rhinitis 07/24/2022   Vertigo 07/24/2021   Pseudophakia of left eye 05/31/2021   Pseudophakia of right eye 05/31/2021   History of osteoporosis 05/27/2021   Mononucleosis 05/27/2021   Cystoid macular edema of left eye 10/11/2020   NSTEMI (non-ST elevated myocardial infarction) (HCC) 10/09/2020   Type 2 macular telangiectasis, right 03/20/2020   Cystoid macular edema of right eye 03/20/2020   Type 2 macular telangiectasis, left 03/20/2020   OSA on CPAP 03/20/2020   Cr(e)st syndrome (HCC) 05/25/2018   Ulcer of esophagus with bleeding 02/27/2018   Psychophysiological insomnia 09/24/2017   Abnormal nuclear cardiac imaging test 05/30/2017    Abnormal stress echo    Solar lentigo 11/08/2016   Acute blood loss anemia 10/10/2015   Red blood cell antibody positive 10/04/2015   S/P coronary artery stent placement 07/12/2014   CAD (coronary artery disease), native coronary artery 06/27/2013   Hyperlipidemia 06/27/2013   Chest pain of uncertain etiology 06/26/2013   History of ST elevation myocardial infarction (STEMI) 06/24/2013   Hypertension    Great toe pain 09/11/2012   H/O foot surgery 04/09/2012   Hallux varus, acquired 02/25/2012   GERD (gastroesophageal reflux disease)    Barrett's esophagus    Cystocele    Rectocele     Orientation RESPIRATION BLADDER Height & Weight     Self, Time, Situation, Place  Normal Continent Weight:   Height:     BEHAVIORAL SYMPTOMS/MOOD NEUROLOGICAL BOWEL NUTRITION STATUS      Continent Diet (see DC summary)  AMBULATORY STATUS COMMUNICATION OF NEEDS Skin   Limited Assist Verbally Normal                       Personal Care Assistance Level of Assistance  Bathing, Feeding, Dressing Bathing Assistance: Limited assistance Feeding assistance: Limited assistance Dressing Assistance: Limited assistance     Functional Limitations Info  Sight, Hearing, Speech Sight Info: Adequate Hearing Info: Impaired Speech Info: Adequate    SPECIAL CARE FACTORS FREQUENCY  PT (By licensed PT), OT (By licensed OT)     PT Frequency: 5x/week OT Frequency: 5x/week            Contractures Contractures Info: Not present    Additional Factors Info  Code Status, Allergies  Code Status Info: FULL Allergies Info: Nsaids  Aspirin            Current Medications (06/17/2024):  This is the current hospital active medication list Current Facility-Administered Medications  Medication Dose Route Frequency Provider Last Rate Last Admin   acetaminophen  (TYLENOL ) tablet 650 mg  650 mg Oral Q6H PRN Seena Marsa NOVAK, MD   650 mg at 06/16/24 2208   Or   acetaminophen  (TYLENOL ) suppository 650 mg   650 mg Rectal Q6H PRN Seena Marsa NOVAK, MD       alum & mag hydroxide-simeth (MAALOX/MYLANTA) 200-200-20 MG/5ML suspension 30 mL  30 mL Oral Q6H PRN Sundil, Subrina, MD   30 mL at 06/14/24 2246   feeding supplement (ENSURE PLUS HIGH PROTEIN) liquid 237 mL  237 mL Oral BID BM Melvin, Alexander B, MD   237 mL at 06/17/24 1331   [START ON 06/18/2024] ferrous sulfate  tablet 325 mg  325 mg Oral Q breakfast Jillian Buttery, MD       fluticasone  furoate-vilanterol (BREO ELLIPTA ) 100-25 MCG/ACT 1 puff  1 puff Inhalation Daily Melvin, Alexander B, MD   1 puff at 06/17/24 0800   hydrALAZINE  (APRESOLINE ) injection 10 mg  10 mg Intravenous Q4H PRN Amin, Ankit C, MD       ipratropium-albuterol  (DUONEB) 0.5-2.5 (3) MG/3ML nebulizer solution 3 mL  3 mL Nebulization Q4H PRN Amin, Ankit C, MD       metoprolol  succinate (TOPROL -XL) 24 hr tablet 12.5 mg  12.5 mg Oral Daily Adhikari, Amrit, MD   12.5 mg at 06/17/24 1329   metoprolol  tartrate (LOPRESSOR ) injection 2.5 mg  2.5 mg Intravenous Q4H PRN Sundil, Subrina, MD       ondansetron  (ZOFRAN ) injection 4 mg  4 mg Intravenous Q6H PRN Amin, Ankit C, MD   4 mg at 06/14/24 1858   pantoprazole  (PROTONIX ) EC tablet 40 mg  40 mg Oral BID Jillian Buttery, MD       pravastatin  (PRAVACHOL ) tablet 40 mg  40 mg Oral QPM Melvin, Alexander B, MD   40 mg at 06/16/24 1854   senna-docusate (Senokot-S) tablet 1 tablet  1 tablet Oral QHS PRN Amin, Ankit C, MD       sodium chloride  flush (NS) 0.9 % injection 3 mL  3 mL Intravenous Q12H Seena Marsa NOVAK, MD   3 mL at 06/17/24 0909   sucralfate  (CARAFATE ) tablet 1 g  1 g Oral TID WC & HS Jillian Buttery, MD         Discharge Medications: Please see discharge summary for a list of discharge medications.  Relevant Imaging Results:  Relevant Lab Results:   Additional Information SSN: [id number]  Majestic Brister A [place], LCSW

## 2024-06-17 NOTE — Progress Notes (Signed)
 PROGRESS NOTE  Kristine [name]  FMW:[id number] DOB: 03/24/1941 DOA: 06/13/2024 PCP: Ileen Rosaline NOVAK, NP   Brief Narrative: Patient is 83 year old female with history of hypertension, hyperlipidemia, GERD, coronary disease status post stenting, CHF, OSA on CPAP, reactive airway disease, CREST syndrome, achalasia who initially presented with complaint of diarrhea, near syncope, low blood pressure and weakness.  She was recently admitted earlier this month for an NSTEMI, CAD cath showed nonobstructive coronary artery disease.  She was eventually discharged to her facility but now presented again with low blood pressure, weakness, presyncope.  Hemoglobin was in the range of 9 on presentation.  GI consulted.She had a syncopal episode while inpatient with black tarry stools, hemoglobin was found to be 5.7, given IV fluids, transfused with 2 units of PRBC.  Status post endoscopy which showed esophageal dilatation, fluid-filled esophagus, esophageal ulcers, gastritis.  Hemoglobin is currently stable.  Plan for discharge back to her nursing facility.  TOC following for assistance  Assessment & Plan:  Principal Problem:   Diarrhea Active Problems:   GERD (gastroesophageal reflux disease)   Hypertension   CAD (coronary artery disease), native coronary artery   Hyperlipidemia   OSA on CPAP   Cr(e)st syndrome (HCC)   S/P coronary artery stent placement   Mild intermittent reactive airway disease   UTI (urinary tract infection)   Weakness   Hypotension   Near syncope   Epigastric abdominal pain   Iron  deficiency anemia due to chronic blood loss   Acute blood loss anemia/severe iron  deficiency: Presented with hypotension, anemia,   Iron  studies show severe iron  deficiency.  Baseline hemoglobin around 9, dropped to the range of 5 here, given total of 2 units of blood transfusion, also given IV iron .  Continue to monitor H&H.  Status post endoscopy which showed esophageal dilatation, fluid-filled  esophagus, esophageal ulcers, gastritis.  Started on Carafate , PPI. hemoglobin is currently stable.  Colonoscopy was not done because it was poorly prepped. Continue iron  supplementation  Presyncope/hypotension: Hypotension secondary to acute blood loss anemia. Given IV fluid.    Antihypertensives on hold.  Blood pressure stable this morning.  May need to resume antihypertensives on discharge.  History of achalasia/GERD syndrome: Follows with gastroenterology.  GI already following her here.  EGD done here showed esophageal dilation, fluid-filled esophagus.  Suspected UTI: Initially started on ceftriaxone , urine culture showed insignificant growth.  Antibiotics discontinued  Hyperlipidemia: Statin  GERD: On Pepcid , PPI  Hypokalemia: Will supplement with potassium.  Coronary artery disease: Recently admitted for NSTEMI.  Underwent cardiac cath.  LHC on 7/14 showed stable coronaries compared to prior cath in 2018, nonobstructive coronary artery disease, widely patent prior stents  Reactive airway disease: On bronchodilators at home  OSA: On CPAP  Leukocytosis: Unclear etiology.  Likely reactive.  Continue to monitor.  Low suspicion for infectious process.         DVT prophylaxis:Place and maintain sequential compression device Start: 06/14/24 0457 Place TED hose Start: 06/14/24 0457     Code Status: Full Code  Family Communication: Daughter at bedside on 7/23  Patient status:Inpatient  Patient is from :SNF  Anticipated discharge to:SNF  Estimated DC date:1-2 days   Consultants: GI  Procedures:EGD  Antimicrobials:  Anti-infectives (From admission, onward)    Start     Dose/Rate Route Frequency Ordered Stop   06/14/24 1000  cefTRIAXone  (ROCEPHIN ) 1 g in sodium chloride  0.9 % 100 mL IVPB  Status:  Discontinued        1 g 200 mL/hr over  30 Minutes Intravenous Every 24 hours 06/13/24 1549 06/16/24 1142   06/13/24 1500  cefTRIAXone  (ROCEPHIN ) 1 g in sodium chloride  0.9  % 100 mL IVPB        1 g 200 mL/hr over 30 Minutes Intravenous  Once 06/13/24 1449 06/13/24 1531       Subjective: Patient seen and examined at bedside today.  Remains comfortable.  No report of hematochezia or melena overnight.  Hemoglobin stable in the range of 9 today.  She feels comfortable.  Denies any abdomen pain, nausea or vomiting.  Objective: Vitals:   06/16/24 2033 06/16/24 2347 06/17/24 0346 06/17/24 0818  BP: (!) 116/54 (!) 108/51 129/62 (!) 142/71  Pulse: 68 83 80 99  Resp: 20 20 20 19   Temp: 99.3 F (37.4 C) 97.7 F (36.5 C) 97.6 F (36.4 C) 98.4 F (36.9 C)  TempSrc:      SpO2: 95% 97% 95% 95%    Intake/Output Summary (Last 24 hours) at 06/17/2024 1045 Last data filed at 06/17/2024 0600 Gross per 24 hour  Intake 840 ml  Output --  Net 840 ml   There were no vitals filed for this visit.  Examination:  General exam: Overall comfortable, not in distress, pleasant elderly female HEENT: PERRL Respiratory system:  no wheezes or crackles  Cardiovascular system: S1 & S2 heard, RRR.  Gastrointestinal system: Abdomen is nondistended, soft and nontender. Central nervous system: Alert and oriented Extremities: No edema, no clubbing ,no cyanosis Skin: No rashes, no ulcers,no icterus     Data Reviewed: I have personally reviewed following labs and imaging studies  CBC: Recent Labs  Lab 06/13/24 0932 06/13/24 1006 06/14/24 0324 06/14/24 1452 06/15/24 0422 06/15/24 2021 06/15/24 2157 06/16/24 0809 06/17/24 0209  WBC 21.1*  --  15.5*  --  16.3*  --   --  13.7* 14.9*  NEUTROABS 16.5*  --   --   --   --   --   --   --   --   HGB 9.2*   < > 6.7*   < > 7.5* 5.7* 5.9* 10.3* 9.2*  HCT 31.3*   < > 22.2*   < > 24.1* 18.4* 19.8* 31.0* 28.3*  MCV 84.4  --  82.2  --  85.8  --   --  87.6 89.6  PLT 255  --  213  --  206  --   --  171 164   < > = values in this interval not displayed.   Basic Metabolic Panel: Recent Labs  Lab 06/11/24 0331 06/13/24 0932  06/14/24 0324 06/15/24 0422 06/15/24 2157 06/16/24 0809 06/17/24 0209  NA 136   < > 134* 134* 139 143 136  K 3.4*   < > 3.9 3.6 4.0 3.4* 3.3*  CL 106   < > 108 106 111 113* 111  CO2 23   < > 23 22 21* 18* 19*  GLUCOSE 100*   < > 89 117* 117* 94 98  BUN 16   < > 25* 16 23 21 13   CREATININE 0.65   < > 0.65 0.61 0.56 0.55 0.57  CALCIUM  8.2*   < > 7.8* 8.0* 7.2* 7.8* 7.7*  MG 2.0  --   --  1.9  --  2.1 1.8   < > = values in this interval not displayed.     Recent Results (from the past 240 hours)  Urine Culture (for pregnant, neutropenic or urologic patients or patients with an indwelling urinary catheter)  Status: Abnormal   Collection Time: 06/13/24  1:29 PM   Specimen: Urine, Clean Catch  Result Value Ref Range Status   Specimen Description URINE, CLEAN CATCH  Final   Special Requests NONE  Final   Culture (A)  Final    <10,000 COLONIES/mL INSIGNIFICANT GROWTH Performed at [facility], [address] [place], [place] [address]    Report Status 06/15/2024 FINAL  Final     Radiology Studies: No results found.  Scheduled Meds:  feeding supplement  237 mL Oral BID BM   fluticasone  furoate-vilanterol  1 puff Inhalation Daily   metoprolol  succinate  12.5 mg Oral Daily   pantoprazole   40 mg Oral BID   pravastatin   40 mg Oral QPM   sodium chloride  flush  3 mL Intravenous Q12H   sucralfate   1 g Oral TID WC & HS   Continuous Infusions:     LOS: 3 days   Ivonne Mustache, MD [facility] P7/24/2025, 10:45 AM

## 2024-06-17 NOTE — Plan of Care (Signed)

## 2024-06-18 ENCOUNTER — Ambulatory Visit: Payer: Self-pay | Admitting: Gastroenterology

## 2024-06-18 DIAGNOSIS — R197 Diarrhea, unspecified: Secondary | ICD-10-CM | POA: Diagnosis not present

## 2024-06-18 LAB — CBC
HCT: 30.2 % — ABNORMAL LOW (ref 36.0–46.0)
Hemoglobin: 9.7 g/dL — ABNORMAL LOW (ref 12.0–15.0)
MCH: 29.3 pg (ref 26.0–34.0)
MCHC: 32.1 g/dL (ref 30.0–36.0)
MCV: 91.2 fL (ref 80.0–100.0)
Platelets: 171 K/uL (ref 150–400)
RBC: 3.31 MIL/uL — ABNORMAL LOW (ref 3.87–5.11)
RDW: 21.4 % — ABNORMAL HIGH (ref 11.5–15.5)
WBC: 11.6 K/uL — ABNORMAL HIGH (ref 4.0–10.5)
nRBC: 0 % (ref 0.0–0.2)

## 2024-06-18 LAB — BASIC METABOLIC PANEL WITH GFR
Anion gap: 8 (ref 5–15)
BUN: 8 mg/dL (ref 8–23)
CO2: 22 mmol/L (ref 22–32)
Calcium: 8.1 mg/dL — ABNORMAL LOW (ref 8.9–10.3)
Chloride: 107 mmol/L (ref 98–111)
Creatinine, Ser: 0.62 mg/dL (ref 0.44–1.00)
GFR, Estimated: >60 mL/min (ref >60)
Glucose, Bld: 99 mg/dL (ref 70–99)
Potassium: 3.8 mmol/L (ref 3.5–5.1)
Sodium: 137 mmol/L (ref 135–145)

## 2024-06-18 LAB — SURGICAL PATHOLOGY

## 2024-06-18 LAB — MAGNESIUM: Magnesium: 1.8 mg/dL (ref 1.7–2.4)

## 2024-06-18 MED ORDER — SUCRALFATE 1 G PO TABS: 1.0000 g | ORAL_TABLET | Freq: Three times a day (TID) | ORAL | Status: AC

## 2024-06-18 MED ORDER — PANTOPRAZOLE SODIUM 40 MG PO TBEC: 40.0000 mg | DELAYED_RELEASE_TABLET | Freq: Two times a day (BID) | ORAL | Status: AC

## 2024-06-18 NOTE — Discharge Summary (Signed)
 Physician Discharge Summary  Kristine [name] FMW:[id number] DOB: [birth year] DOA: 06/13/2024  PCP: Ileen Rosaline NOVAK, NP  Admit date: 06/13/2024 Discharge date: 06/18/2024  Admitted From:SNF Disposition:  SNF  Discharge Condition:Stable CODE STATUS:FULL Diet recommendation: soft diet  Brief/Interim Summary: Patient is 83 year old female with history of hypertension, hyperlipidemia, GERD, coronary disease status post stenting, CHF, OSA on CPAP, reactive airway disease, CREST syndrome, achalasia who initially presented with complaint of diarrhea, near syncope, low blood pressure and weakness.  She was recently admitted earlier this month for an NSTEMI, CAD cath showed nonobstructive coronary artery disease.  She was eventually discharged to her facility but now presented again with low blood pressure, weakness, presyncope.  Hemoglobin was in the range of 9 on presentation.  GI consulted.She had a syncopal episode while inpatient with black tarry stools, hemoglobin was found to be 5.7, given IV fluids, transfused with 2 units of PRBC.  Status post endoscopy which showed esophageal dilatation, fluid-filled esophagus, esophageal ulcers, gastritis.  Hemoglobin is currently stable.  Continue Protonix  twice daily and Carafate .  Plan for discharge back to her nursing facility.  She will follow-up with her gastroenterologist as an outpatient.  Following problems were addressed during the hospitalization:   Acute blood loss anemia/severe iron  deficiency: Presented with hypotension, anemia,   Iron  studies show severe iron  deficiency.  Baseline hemoglobin around 9, dropped to the range of 5 here, given total of 2 units of blood transfusion, also given IV iron .  Continue to monitor H&H.  Status post endoscopy which showed esophageal dilatation, fluid-filled esophagus, esophageal ulcers, gastritis.  Started on Carafate , PPI. hemoglobin is currently stable.  Colonoscopy was not done because it was poorly  prepped. Continue iron  supplementation.  Follow-up with gastroenterology as an outpatient.   Presyncope/hypotension: Hypotension secondary to acute blood loss anemia. Given IV fluid.   History of achalasia/GERD syndrome: Follows with gastroenterology.  EGD done here showed esophageal dilation, fluid-filled esophagus.   Suspected UTI: Initially started on ceftriaxone , urine culture showed insignificant growth.  Antibiotics discontinued   Hyperlipidemia: Statin   GERD: On Pepcid , PPI   Hypokalemia: Supplemented  with potassium.   Coronary artery disease: Recently admitted for NSTEMI.  Underwent cardiac cath.  LHC on 7/14 showed stable coronaries compared to prior cath in 2018, nonobstructive coronary artery disease, widely patent prior stents   Reactive airway disease: On bronchodilators at home   OSA: On CPAP   Leukocytosis: Unclear etiology.  Likely reactive.  Improved    Discharge Diagnoses:  Principal Problem:   Diarrhea Active Problems:   GERD (gastroesophageal reflux disease)   Hypertension   CAD (coronary artery disease), native coronary artery   Hyperlipidemia   OSA on CPAP   Cr(e)st syndrome (HCC)   S/P coronary artery stent placement   Mild intermittent reactive airway disease   UTI (urinary tract infection)   Weakness   Hypotension   Near syncope   Epigastric abdominal pain   Iron  deficiency anemia due to chronic blood loss   Melena    Discharge Instructions  Discharge Instructions     Diet general   Complete by: As directed    Soft diet   Discharge instructions   Complete by: As directed    1)Please take your medications as instructed 2)Follow up with your gastroenterologist in 1 to 2 weeks 3)Do a CBC test in a week to check your hemoglobin.   Increase activity slowly   Complete by: As directed       Allergies as of 06/18/2024  Reactions   Nsaids Other (See Comments)   Prone to bleeding   Aspirin  Other (See Comments)   Prone to  bleeding OK to take low-dose EC but not aspirin  based medications.        Medication List     PAUSE taking these medications    aspirin  EC 81 MG tablet Wait to take this until: June 21, 2024 Take 1 tablet (81 mg total) by mouth daily. What changed: when to take this       TAKE these medications    acetaminophen  500 MG tablet Commonly known as: TYLENOL  Take 1,000 mg by mouth every 6 (six) hours as needed for headache or moderate pain (pain score 4-6).   albuterol  108 (90 Base) MCG/ACT inhaler Commonly known as: VENTOLIN  HFA Inhale 1 puff into the lungs every 4 (four) hours as needed for wheezing or shortness of breath.   b complex vitamins capsule Take 1 capsule by mouth in the morning.   bisacodyl 10 MG suppository Commonly known as: DULCOLAX Place 10 mg rectally as needed for moderate constipation.   famotidine  20 MG tablet Commonly known as: PEPCID  Take 40 mg by mouth at bedtime.   ferrous sulfate  325 (65 FE) MG tablet Take 1 tablet (325 mg total) by mouth daily with breakfast.   fluticasone  furoate-vilanterol 100-25 MCG/ACT Aepb Commonly known as: BREO ELLIPTA  Inhale 1 puff into the lungs daily. What changed: when to take this   ipratropium-albuterol  0.5-2.5 (3) MG/3ML Soln Commonly known as: DUONEB Take 3 mLs by nebulization every 6 (six) hours as needed.   MAGNESIUM  PO Take 1 tablet by mouth in the morning.   metoprolol  succinate 25 MG 24 hr tablet Commonly known as: Toprol  XL Take 0.5 tablets (12.5 mg total) by mouth daily. What changed: when to take this   Milk of Magnesia 1200 MG/15ML suspension Generic drug: magnesium  hydroxide Take 30 mLs by mouth as needed for mild constipation.   nitroGLYCERIN  0.4 MG SL tablet Commonly known as: NITROSTAT  Place 1 tablet (0.4 mg total) under the tongue every 5 (five) minutes x 3 doses as needed for chest pain.   NUTRITIONAL SUPPLEMENT PO Take 120 mLs by mouth in the morning and at bedtime. Med[employer]    OLIVE LEAF EXTRACT PO Take 1 capsule by mouth in the morning.   pantoprazole  40 MG tablet Commonly known as: PROTONIX  Take 1 tablet (40 mg total) by mouth 2 (two) times daily. What changed: when to take this   pravastatin  40 MG tablet Commonly known as: PRAVACHOL  Take 1 tablet (40 mg total) by mouth every evening.   Quercetin 250 MG Tabs Take 250 mg by mouth in the morning.   senna-docusate 8.6-50 MG tablet Commonly known as: Senokot-S Take 1 tablet by mouth 2 (two) times daily.   SODIUM PHOSPHATES RE Place 1 Insert rectally as needed. Enema   sucralfate  1 g tablet Commonly known as: CARAFATE  Take 1 tablet (1 g total) by mouth 4 (four) times daily -  with meals and at bedtime.        Follow-up Information     Schmerge, Rosaline NOVAK, NP. Schedule an appointment as soon as possible for a visit in 1 week(s).   Specialty: Nurse Practitioner Contact information: [address] [place] [address] [phone]                Allergies  Allergen Reactions   Nsaids Other (See Comments)    Prone to bleeding   Aspirin  Other (  See Comments)    Prone to bleeding OK to take low-dose EC but not aspirin  based medications.    Consultations: GI   Procedures/Studies: DG Chest Port 1 View Result Date: 06/13/2024 CLINICAL DATA:  Generalized weakness, hypotension EXAM: PORTABLE CHEST 1 VIEW COMPARISON:  06/06/2024 FINDINGS: Interval development of small left pleural effusion. There is marked gaseous distension of the esophagus in addition to a moderate hiatal hernia suggestive of a functional obstruction, better seen on CT examination of 06/08/2024. No pneumothorax. Cardiac size within normal limits. Pulmonary vascularity is normal. No acute bone abnormality. IMPRESSION: 1. Small left pleural effusion. 2. Marked gaseous distension of the esophagus in addition to a moderate hiatal hernia suggestive of a functional obstruction. This would be better assessed  with endoscopy Electronically Signed   By: Dorethia Molt M.D.   On: 06/13/2024 09:55   CT Angio Chest Pulmonary Embolism (PE) W or WO Contrast Result Date: 06/08/2024 CLINICAL DATA:  Positive D-dimer. EXAM: CT ANGIOGRAPHY CHEST WITH CONTRAST TECHNIQUE: Multidetector CT imaging of the chest was performed using the standard protocol during bolus administration of intravenous contrast. Multiplanar CT image reconstructions and MIPs were obtained to evaluate the vascular anatomy. RADIATION DOSE REDUCTION: This exam was performed according to the departmental dose-optimization program which includes automated exposure control, adjustment of the mA and/or kV according to patient size and/or use of iterative reconstruction technique. CONTRAST:  75mL OMNIPAQUE  IOHEXOL  350 MG/ML SOLN COMPARISON:  May 05, 2024. FINDINGS: Cardiovascular: Satisfactory opacification of the pulmonary arteries to the segmental level. No evidence of pulmonary embolism. Normal heart size. No pericardial effusion. Mediastinum/Nodes: Stable severely dilated and fluid-filled esophagus is noted with associated hiatal hernia. No definite abnormality. Thyroid  gland is unremarkable. Lungs/Pleura: No pneumothorax is noted. Small bilateral pleural effusions are noted with minimal adjacent subsegmental atelectasis. Upper Abdomen: No acute abnormality. Musculoskeletal: No chest wall abnormality. No acute or significant osseous findings. Review of the MIP images confirms the above findings. IMPRESSION: No definite evidence of pulmonary embolus. Small bilateral pleural effusions with minimal adjacent subsegmental atelectasis. Stable severely dilated and fluid-filled esophagus is noted with associated hiatal hernia. Aortic Atherosclerosis (ICD10-I70.0). Electronically Signed   By: Lynwood Landy Raddle M.D.   On: 06/08/2024 15:37   CARDIAC CATHETERIZATION Result Date: 06/07/2024 Conclusions: Stable appearance of coronary arteries compared with last  catheterization in 2018; there is mild-moderate, non-obstructive coronary artery disease involving the mid/distal LAD, mid LCx, and proximal/mid LAD. Widely patent overlapping LCx/OM1 stents. Normal left ventricular filling pressure (LVEDP 10 mmHg). Recommendations: Continue secondary prevention of coronary artery disease. Consider further evaluation for there potential causes of shortness of breath and elevated HS-TnI/BNP, including pulmonary embolism. [name], MD [facility]  ECHOCARDIOGRAM COMPLETE Result Date: 06/07/2024    ECHOCARDIOGRAM REPORT   Patient Name:   [facility] Date of Exam: 06/07/2024 Medical Rec #:  [id number]   Height:       60.0 in Accession #:    [id number]  Weight:       104.7 lb Date of Birth:  [birth year]    BSA:          1.418 m Patient Age:    83 years    BP:           120/63 mmHg Patient Gender: F           HR:           89 bpm. Exam Location:  Inpatient Procedure: 2D Echo, 3D Echo, Color Doppler, Cardiac Doppler and Strain Analysis            (  Both Spectral and Color Flow Doppler were utilized during            procedure). Indications:    Myocardial infarct I21.9  History:        Patient has prior history of Echocardiogram examinations, most                 recent 11/20/2023. CAD, Prior CABG, Signs/Symptoms:Dyspnea; Risk                 Factors:Sleep Apnea, Hypertension and Dyslipidemia.  Sonographer:    Koleen Popper RDCS Referring Phys: [id number] MAUDE JAYSON EMMER  Sonographer Comments: Global longitudinal strain was attempted. IMPRESSIONS  1. Left ventricular ejection fraction, by estimation, is 50 to 55%. The left ventricle has low normal function. The left ventricle demonstrates regional wall motion abnormalities (see scoring diagram/findings for description). Left ventricular diastolic  parameters were normal. The average left ventricular global longitudinal strain is -14.3 %. The global longitudinal strain is abnormal.  2. Right ventricular systolic function is normal. The  right ventricular size is normal. There is normal pulmonary artery systolic pressure. The estimated right ventricular systolic pressure is 21.0 mmHg.  3. The mitral valve is grossly normal. No evidence of mitral valve regurgitation. No evidence of mitral stenosis.  4. The aortic valve is tricuspid. Aortic valve regurgitation is mild. Aortic valve sclerosis is present, with no evidence of aortic valve stenosis.  5. The inferior vena cava is normal in size with greater than 50% respiratory variability, suggesting right atrial pressure of 3 mmHg. Comparison(s): A prior study was performed on 10/21/2023. No change in LVEF, subtle RWMA as noted above. FINDINGS  Left Ventricle: Left ventricular ejection fraction, by estimation, is 50 to 55%. The left ventricle has low normal function. The left ventricle demonstrates regional wall motion abnormalities. The average left ventricular global longitudinal strain is -14.3 %. Strain was performed and the global longitudinal strain is abnormal. 3D ejection fraction reviewed and evaluated as part of the interpretation. Alternate measurement of EF is felt to be most reflective of LV function. The left ventricular internal cavity size was small. There is borderline left ventricular hypertrophy. Left ventricular diastolic parameters were normal.  LV Wall Scoring: The mid and distal lateral wall and mid anterolateral segment are hypokinetic. Right Ventricle: The right ventricular size is normal. No increase in right ventricular wall thickness. Right ventricular systolic function is normal. There is normal pulmonary artery systolic pressure. The tricuspid regurgitant velocity is 2.12 m/s, and  with an assumed right atrial pressure of 3 mmHg, the estimated right ventricular systolic pressure is 21.0 mmHg. Left Atrium: Left atrial size was normal in size. Right Atrium: Right atrial size was normal in size. Pericardium: There is no evidence of pericardial effusion. Mitral Valve: The mitral  valve is grossly normal. No evidence of mitral valve regurgitation. No evidence of mitral valve stenosis. Tricuspid Valve: The tricuspid valve is normal in structure. Tricuspid valve regurgitation is mild . No evidence of tricuspid stenosis. Aortic Valve: The aortic valve is tricuspid. Aortic valve regurgitation is mild. Aortic valve sclerosis is present, with no evidence of aortic valve stenosis. Pulmonic Valve: The pulmonic valve was normal in structure. Pulmonic valve regurgitation is not visualized. No evidence of pulmonic stenosis. Aorta: The aortic root and ascending aorta are structurally normal, with no evidence of dilitation. Venous: The inferior vena cava is normal in size with greater than 50% respiratory variability, suggesting right atrial pressure of 3 mmHg. IAS/Shunts: The interatrial septum was not well  visualized.  LEFT VENTRICLE PLAX 2D LVIDd:         3.80 cm   Diastology LVIDs:         3.00 cm   LV e' medial:    4.90 cm/s LV PW:         1.10 cm   LV E/e' medial:  9.7 LV IVS:        1.20 cm   LV e' lateral:   5.77 cm/s LVOT diam:     2.00 cm   LV E/e' lateral: 8.3 LV SV:         50 LV SV Index:   35        2D Longitudinal Strain LVOT Area:     3.14 cm  2D Strain GLS Avg:     -14.3 %                           3D Volume EF:                          3D EF:        45 %                          LV EDV:       89 ml                          LV ESV:       49 ml                          LV SV:        40 ml RIGHT VENTRICLE             IVC RV Basal diam:  3.20 cm     IVC diam: 0.70 cm RV S prime:     17.30 cm/s TAPSE (M-mode): 2.1 cm LEFT ATRIUM             Index        RIGHT ATRIUM          Index LA diam:        2.70 cm 1.90 cm/m   RA Area:     8.64 cm LA Vol (A2C):   26.3 ml 18.55 ml/m  RA Volume:   15.80 ml 11.14 ml/m LA Vol (A4C):   22.0 ml 15.52 ml/m LA Biplane Vol: 25.4 ml 17.91 ml/m  AORTIC VALVE LVOT Vmax:   94.30 cm/s LVOT Vmean:  66.000 cm/s LVOT VTI:    0.159 m  AORTA Ao Root diam: 2.80 cm  Ao Asc diam:  2.90 cm MITRAL VALVE               TRICUSPID VALVE MV Area (PHT): 2.87 cm    TR Peak grad:   18.0 mmHg MV Decel Time: 264 msec    TR Vmax:        212.00 cm/s MV E velocity: 47.70 cm/s MV A velocity: 76.00 cm/s  SHUNTS MV E/A ratio:  0.63        Systemic VTI:  0.16 m                            Systemic Diam: 2.00 cm Sunit Tolia Electronically signed by Madonna Large Signature Date/Time:  06/07/2024/1:35:36 PM    Final    DG Chest Port 1 View Result Date: 06/06/2024 EXAM: 1 VIEW XRAY OF THE CHEST 06/06/2024 07:40:35 AM COMPARISON: 1 view chest x-ray and CT chest without contrast 06/04/2024. CLINICAL HISTORY: Syncope. FINDINGS: LUNGS AND PLEURA: Changes of COPD are present. No focal pulmonary opacity. No pulmonary edema. No pleural effusion. No pneumothorax. HEART AND MEDIASTINUM: The heart is enlarged. Atherosclerotic changes are present at the aortic arch. Dilated esophagus is again noted. BONES AND SOFT TISSUES: No acute osseous abnormality. IMPRESSION: 1. No acute findings. 2. Cardiomegaly without failure. 3. Changes of COPD. 4. Atherosclerotic changes at the aortic arch. 5. Dilated esophagus. Electronically signed by: Lonni Necessary MD 06/06/2024 07:45 AM EDT RP Workstation: [id number]   CT CHEST WO CONTRAST Result Date: 06/04/2024 CLINICAL DATA:  Dyspnea.  Chest wall disease suspected. EXAM: CT CHEST WITHOUT CONTRAST TECHNIQUE: Multidetector CT imaging of the chest was performed following the standard protocol without IV contrast. RADIATION DOSE REDUCTION: This exam was performed according to the departmental dose-optimization program which includes automated exposure control, adjustment of the mA and/or kV according to patient size and/or use of iterative reconstruction technique. COMPARISON:  CT chest 12/13/2023 FINDINGS: Cardiovascular: No significant vascular findings. Normal heart size. No pericardial effusion. There are atherosclerotic calcifications of the aorta. Aberrant right  subclavian artery again noted. Mediastinum/Nodes: Dilated esophagus with air-fluid level again seen. There surgical changes at the gastroesophageal junction. Visualized thyroid  gland within normal limits. No enlarged lymph nodes are identified. Lungs/Pleura: There are trace bilateral pleural effusions. There are atelectatic changes in the lingula and bilateral lower lobes. There is scarring in both lung apices. There are few scattered 2 mm nodular densities similar to prior. Upper Abdomen: No acute findings. Musculoskeletal: Mild T11 compression deformity is unchanged. IMPRESSION: 1. Trace bilateral pleural effusions with atelectatic changes in the lingula and bilateral lower lobes. 2. Stable dilated esophagus with air-fluid level. 3. Stable 2 mm nodular densities in the lungs. No follow-up needed if patient is low-risk (and has no known or suspected primary neoplasm). Non-contrast chest CT can be considered in 12 months if patient is high-risk. This recommendation follows the consensus statement: Guidelines for Management of Incidental Pulmonary Nodules Detected on CT Images: From the Fleischner Society 2017; Radiology 2017; 284:228-243. 4. Aortic atherosclerosis. Aortic Atherosclerosis (ICD10-I70.0). Electronically Signed   By: Greig Pique M.D.   On: 06/04/2024 22:53   DG Chest Port 1 View Result Date: 06/04/2024 CLINICAL DATA:  Shortness of breath and wheezing. Tightness in chest. EXAM: PORTABLE CHEST 1 VIEW COMPARISON:  12/12/2023 FINDINGS: Dilated gas filled esophagogastric structure throughout the mediastinum. Appearance suggests marked esophageal dilatation, likely achalasia. Possible hiatal hernia. Similar appearance to previous study. Visualized abdominal stomach is also distended. Heart size and pulmonary vascularity are normal. Lungs are clear. Calcification of the aorta. IMPRESSION: Severely dilated gas-filled esophagus and stomach likely represent achalasia. No change. Lungs are clear.  Electronically Signed   By: Elsie Gravely M.D.   On: 06/04/2024 00:37      Subjective: Patient seen and examined at bedside today.  Hemodynamically stable.  Overall comfortable, denies any new complaints.  No hematochezia or melena.  Hemoglobin stayed stable this morning.  She feels ready to be discharged back to her nursing facility.  Discharge Exam: Vitals:   06/18/24 0816 06/18/24 0821  BP:  (!) 108/95  Pulse:  90  Resp:  18  Temp:  98.1 F (36.7 C)  SpO2: 94% 95%   Vitals:   06/18/24 0447  06/18/24 0447 06/18/24 0816 06/18/24 0821  BP: 137/64 137/64  (!) 108/95  Pulse: 83 88  90  Resp: 18 18  18   Temp: 98.3 F (36.8 C) 98.3 F (36.8 C)  98.1 F (36.7 C)  TempSrc:      SpO2: 91% 91% 94% 95%    General: Pt is alert, awake, not in acute distress, pleasant elderly female Cardiovascular: RRR, S1/S2 +, no rubs, no gallops Respiratory: CTA bilaterally, no wheezing, no rhonchi Abdominal: Soft, NT, ND, bowel sounds + Extremities: no edema, no cyanosis    The results of significant diagnostics from this hospitalization (including imaging, microbiology, ancillary and laboratory) are listed below for reference.     Microbiology: Recent Results (from the past 240 hours)  Urine Culture (for pregnant, neutropenic or urologic patients or patients with an indwelling urinary catheter)     Status: Abnormal   Collection Time: 06/13/24  1:29 PM   Specimen: Urine, Clean Catch  Result Value Ref Range Status   Specimen Description URINE, CLEAN CATCH  Final   Special Requests NONE  Final   Culture (A)  Final    <10,000 COLONIES/mL INSIGNIFICANT GROWTH Performed at [facility], [address] [place], [place] [address]    Report Status 06/15/2024 FINAL  Final     Labs: BNP (last 3 results) Recent Labs    06/04/24 1831 06/13/24 0932  BNP 1,207.9* 541.3*   Basic Metabolic Panel: Recent Labs  Lab 06/15/24 0422 06/15/24 2157 06/16/24 0809 06/17/24 0209  06/18/24 0153  NA 134* 139 143 136 137  K 3.6 4.0 3.4* 3.3* 3.8  CL 106 111 113* 111 107  CO2 22 21* 18* 19* 22  GLUCOSE 117* 117* 94 98 99  BUN 16 23 21 13 8   CREATININE 0.61 0.56 0.55 0.57 0.62  CALCIUM  8.0* 7.2* 7.8* 7.7* 8.1*  MG 1.9  --  2.1 1.8 1.8   Liver Function Tests: Recent Labs  Lab 06/13/24 0932 06/14/24 0324  AST 20 13*  ALT 16 12  ALKPHOS 43 34*  BILITOT 0.4 0.5  PROT 4.5* 3.9*  ALBUMIN 2.4* 2.0*   Recent Labs  Lab 06/13/24 0932  LIPASE 40   No results for input(s): AMMONIA in the last 168 hours. CBC: Recent Labs  Lab 06/13/24 0932 06/13/24 1006 06/14/24 0324 06/14/24 1452 06/15/24 0422 06/15/24 2021 06/15/24 2157 06/16/24 0809 06/17/24 0209 06/18/24 0153  WBC 21.1*  --  15.5*  --  16.3*  --   --  13.7* 14.9* 11.6*  NEUTROABS 16.5*  --   --   --   --   --   --   --   --   --   HGB 9.2*   < > 6.7*   < > 7.5* 5.7* 5.9* 10.3* 9.2* 9.7*  HCT 31.3*   < > 22.2*   < > 24.1* 18.4* 19.8* 31.0* 28.3* 30.2*  MCV 84.4  --  82.2  --  85.8  --   --  87.6 89.6 91.2  PLT 255  --  213  --  206  --   --  171 164 171   < > = values in this interval not displayed.   Cardiac Enzymes: No results for input(s): CKTOTAL, CKMB, CKMBINDEX, TROPONINI in the last 168 hours. BNP: Invalid input(s): POCBNP CBG: Recent Labs  Lab 06/15/24 1951  GLUCAP 119*   D-Dimer No results for input(s): DDIMER in the last 72 hours. Hgb A1c No results for input(s):  HGBA1C in the last 72 hours. Lipid Profile No results for input(s): CHOL, HDL, LDLCALC, TRIG, CHOLHDL, LDLDIRECT in the last 72 hours. Thyroid  function studies No results for input(s): TSH, T4TOTAL, T3FREE, THYROIDAB in the last 72 hours.  Invalid input(s): FREET3 Anemia work up No results for input(s): VITAMINB12, FOLATE, FERRITIN, TIBC, IRON , RETICCTPCT in the last 72 hours. Urinalysis    Component Value Date/Time   COLORURINE YELLOW 06/13/2024 1329    APPEARANCEUR HAZY (A) 06/13/2024 1329   LABSPEC 1.015 06/13/2024 1329   PHURINE 7.0 06/13/2024 1329   GLUCOSEU NEGATIVE 06/13/2024 1329   HGBUR NEGATIVE 06/13/2024 1329   BILIRUBINUR NEGATIVE 06/13/2024 1329   BILIRUBINUR negative 07/14/2021 1409   BILIRUBINUR neg 12/29/2014 1954   KETONESUR 5 (A) 06/13/2024 1329   PROTEINUR NEGATIVE 06/13/2024 1329   UROBILINOGEN 0.2 07/14/2021 1409   UROBILINOGEN 0.2 02/01/2013 1541   NITRITE NEGATIVE 06/13/2024 1329   LEUKOCYTESUR LARGE (A) 06/13/2024 1329   Sepsis Labs Recent Labs  Lab 06/15/24 0422 06/16/24 0809 06/17/24 0209 06/18/24 0153  WBC 16.3* 13.7* 14.9* 11.6*   Microbiology Recent Results (from the past 240 hours)  Urine Culture (for pregnant, neutropenic or urologic patients or patients with an indwelling urinary catheter)     Status: Abnormal   Collection Time: 06/13/24  1:29 PM   Specimen: Urine, Clean Catch  Result Value Ref Range Status   Specimen Description URINE, CLEAN CATCH  Final   Special Requests NONE  Final   Culture (A)  Final    <10,000 COLONIES/mL INSIGNIFICANT GROWTH Performed at [facility], [address] [place], [place] [address]    Report Status 06/15/2024 FINAL  Final    Please note: You were cared for by a hospitalist during your hospital stay. Once you are discharged, your primary care physician will handle any further medical issues. Please note that NO REFILLS for any discharge medications will be authorized once you are discharged, as it is imperative that you return to your primary care physician (or establish a relationship with a primary care physician if you do not have one) for your post hospital discharge needs so that they can reassess your need for medications and monitor your lab values.    Time coordinating discharge: 40 minutes  SIGNED:   Ivonne Mustache, MD  [facility] 06/18/2024, 10:18 AM Pager [phone]  If 7PM-7AM, please contact  night-coverage [email or web address] Password [email or web address]

## 2024-06-18 NOTE — TOC Transition Note (Addendum)
 Transition of Care [facility]) - Discharge Note   Patient Details  Name: Kristine [name] MRN: [id number] Date of Birth: [birth year]  Transition of Care [facility]) CM/SW Contact:  Aby Gessel A [place], LCSW Phone Number: 06/18/2024, 10:47 AM   Clinical Narrative:     Patient will DC to: [facility]  Anticipated DC date: 06/18/24  Family notified: Powell Sprang  Transport by: ROME Barrows ID [id number] Reference ID [id number]   Approval Dates: 06/18/2024-06/22/2024    Per MD patient ready for DC to [facility] . RN, patient, patient's family, and facility notified of DC. Discharge Summary and FL2 sent to facility. RN to call report prior to discharge (Room 104, [phone]). DC packet on chart. Ambulance transport requested for patient.     CSW will sign off for now as social work intervention is no longer needed. Please consult us  again if new needs arise.   Final next level of care: Skilled Nursing Facility Barriers to Discharge: Barriers Resolved   Patient Goals and CMS Choice            Discharge Placement              Patient chooses bed at: [facility] Patient to be transferred to facility by: [facility] Name of family member notified: Powell Sprang Patient and family notified of of transfer: 06/18/24  Discharge Plan and Services Additional resources added to the After Visit Summary for                                       Social Drivers of Health (SDOH) Interventions SDOH Screenings   Food Insecurity: No Food Insecurity (06/13/2024)  Housing: Low Risk  (06/13/2024)  Transportation Needs: No Transportation Needs (06/13/2024)  Utilities: Not At Risk (06/13/2024)  Depression (PHQ2-9): Low Risk  (07/24/2021)  Recent Concern: Depression (PHQ2-9) - Medium Risk (05/25/2021)  Social Connections: Moderately Integrated (06/13/2024)  Stress: Stress Concern Present (03/26/2024)   Received from [facility]  Tobacco Use: Low Risk  (06/16/2024)     Readmission Risk  Interventions     No data to display

## 2024-06-18 NOTE — TOC Progression Note (Signed)
 Transition of Care [facility]) - Progression Note    Patient Details  Name: Kristine [name] MRN: [id number] Date of Birth: 06/27/1941  Transition of Care [facility]) CM/SW Contact  Judine Arciniega A [place], LCSW Phone Number: 06/18/2024, 8:57 AM  Clinical Narrative:     Pt's authorization approved. Auth ID [id number] Reference ID [id number]  Approval Dates: 06/18/2024-06/22/2024  [facility] has bed available today for pt if medically stable for DC.   TOC will continue to follow.   Expected Discharge Plan: Skilled Nursing Facility Barriers to Discharge: Continued Medical Work up, English as a second language teacher               Expected Discharge Plan and Services                                               Social Drivers of Health (SDOH) Interventions SDOH Screenings   Food Insecurity: No Food Insecurity (06/13/2024)  Housing: Low Risk  (06/13/2024)  Transportation Needs: No Transportation Needs (06/13/2024)  Utilities: Not At Risk (06/13/2024)  Depression (PHQ2-9): Low Risk  (07/24/2021)  Recent Concern: Depression (PHQ2-9) - Medium Risk (05/25/2021)  Social Connections: Moderately Integrated (06/13/2024)  Stress: Stress Concern Present (03/26/2024)   Received from [facility]  Tobacco Use: Low Risk  (06/16/2024)    Readmission Risk Interventions     No data to display

## 2024-06-18 NOTE — Plan of Care (Signed)

## 2024-06-19 ENCOUNTER — Encounter (HOSPITAL_COMMUNITY): Payer: Self-pay | Admitting: Gastroenterology

## 2024-06-19 LAB — TYPE AND SCREEN
ABO/RH(D): A POS
Antibody Screen: POSITIVE
Donor AG Type: NEGATIVE
Donor AG Type: NEGATIVE
Unit division: 0
Unit division: 0
Unit division: 0
Unit division: 0
Unit division: 0

## 2024-06-19 LAB — BPAM RBC
Blood Product Expiration Date: 202508052359
Blood Product Expiration Date: 202508162359
Blood Product Expiration Date: 202508292359
Blood Product Expiration Date: 202508292359
Blood Product Expiration Date: 202508292359
ISSUE DATE / TIME: 202507230043
ISSUE DATE / TIME: 202507230336
Unit Type and Rh: 6200
Unit Type and Rh: 6200
Unit Type and Rh: 6200
Unit Type and Rh: 6200
Unit Type and Rh: 6200

## 2024-06-26 NOTE — Progress Notes (Deleted)
 Cardiology Office Note:    Date:  06/26/2024   ID:  Kristine [name], DOB 02-23-1941, MRN [id number]  PCP:  Ileen Rosaline NOVAK, NP  Cardiologist:  [name], MD { Click to update primary MD,subspecialty MD or APP then REFRESH:1}    Referring MD: Ileen Rosaline NOVAK, NP   Chief Complaint: hospital follow-up of NSTEMI  History of Present Illness:    Kristine [name] is a 83 y.o. 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 Fell and presents today for hospital follow-up of NSTEMI.     CAD - LHC in 05/2013 during admission for acute lateral STEMI showed 90% of OM1 with otherwise mild to moderate non-obstructive disease. S/p DES to OM1. EF 35-40% on LV gram.  - LHC in 05/2017 after abnormal stress test showed patent stent to OM1 and 70% stenosis of proximal LCX with otherwise mild to moderate disease. S/p DES to LCX.  - LHC on 06/07/2024 during an admission for NSTEMI showed widely patent stents to LCX and OM1 with otherwise only mild to moderate non-obstructive disease. Medically therapy recommended.   Ischemic Cardiomyopathy - Initially diagnosed in 05/2013 at time of STEMI. EF 35-40% on LV gram at that time but 50-55% on Echo.  - Echo in 10/2023 showed LVEF of 50-55% with normal wall motion and mild asymmetric LVH of the basal-septal segment, normal RV function, and no significant valvular disease.  - Echo on 06/07/2024 during admission for NSTEMI showed LVEF of 50-55% with hypokinesis of the mid and distal  lateral wall and mid anterolateral wall, normal RV function, and mild AI.   Aortic Insufficiency - Mild AI noted on Echo in 05/2024.   Recurrent Syncope - Episode of syncope in 02/2019 in setting of walking in a heavy coat while wearing a facemask. Monitor showed a few PVCs and a occasional ventricular runs up to 9 beats but no significant arrhythmias.  - Episode of syncope in 08/2023 in setting of walking out of her home early one morning. Orthostatics were negative at follow-up visits for this in 09/2023. Monitor in 09/2023 showed a few short runs of a wide complex tachycardia (VT vs SVT with aberrancy) with longest run being 20 seconds with average heart rate of 165 bpm as well as rare PVCs. Echo in 10/2023 shoed normal LV function and no significant valvular disease as above.  - Episode of vasovagal syncope during on 06/06/2024 during admission for NSTEMI. Occurred after she urinated on bedside commode and briefly bradyed down with heart rates to the high 20s.  - Episode of syncope on 06/15/2024 during admission for acute GI bleed. Occurred in setting of hypotension and acute blood loss anemia after after a black tarry stool. Hemoglobin had dropped to 5.7.      Patient was recently admitted from 06/03/2024 to 06/11/2024 for NSTEMI after presenting with shortness of breath. High-sensitivity troponin peaked at 718. Echo showed LVEF of 50-55% with hypokinesis of the mid and distal lateral wall and mid anterolateral wall, normal RV function, and mild AI. LHC showed widely patent  stents to LCX and OM1 with otherwise only mild to moderate non-obstructive disease. Medical therapy was recommended. Cause of troponin elevation unclear. D-dimer was elevated but chest CTA was negative for PE. CTA did show stable dilated and fluid-filled esophagus with associated hiatal hernia. She did have an episode of vasovagal syncopal episode on the morning of 7/13 that occurred when she was on the bedside commode. She urinated  and then developed flushing, lightheadedness, and diaphoresis. Heart rate dropped to 29 bpm and then she had loss of consciousness. She had not been eating or drinking well at time of the event and had been NPO most of the day so was given some IV fluids. Hospitalization was also complicated by acute on chronic anemia with hemoglobin dropping to 6.9. Iron  panel showed severe iron  deficiency. She was treated with 1 unit of PRBCs and plan was for outpatient IV iron  infusions.   She was readmitted from 06/13/2024 to 06/18/2024 for acute GI bleed after presenting with increased weakness and diarrhea with hypotension and an episode of near syncope.  Hemoglobin was stable around 9 on admission but dropped to 6.7 the next morning. She was transfuse 1 unit of PRBCs. She had another syncopal episode on the evening of 7/22 after after having a black tarry stool. Hemoglobin had dropped to 5.7. Antihypertensives were held. She was given IV fluids and received 2 more units of PRBCs as well as IV iron . GI was consulted. EGD showed esophageal dilatation, fluid-filled esophagus, esophageal ulcers, and gastritis. Colonoscopy was not able to be completed due to poor prep. Aspirin  was initially held but she was told she could restart this on 06/21/2024.   Patient presents today for follow-up ***  CAD  S/p DES to OM1 in 05/2013 in setting of NSTEMI and DES to LCX in 05/2017. She was recently admitted in 05/2024 with NSTEMI. LHC showed patent stents and otherwise only mild disease. Unclear cause of troponin elevation.  - No chest pain.  - Continue Aspirin  81mg  daily, Toprol -XL 12.5mg  daily, and Pravastatin  40mg  daily.   Ischemic Cardiomyopathy LVEF 35-40 on LV gram at time of NSTEMI in 05/2013 but 50-55% on Echo that same admission. Most recent Echo in 05/2024 showed LVEF of 50-55% with hypokinesis of the mid and distal lateral wall and mid anterolateral wall, normal RV function, and mild AI.  - Euvolemic on exam. *** - Continue  Toprol -XL 12.5mg  daily.   Frequent PVCs She was noted to have frequent PVCs and short runs of tachycardia on telemetry during admission in 05/2024.  -*** - Continue Toprol -XL 12.5mg  daily.   Mild Aortic Insufficiency Noted on recent Echo in 05/2024.  - Can continue routine monitoring with serial Echos per guidelines.   History of Syncope History of recurrent syncope as described above. Monitor in 09/2023 after a syncopal episode the month prior showed a few short runs of a wide complex tachycardia (VT vs SVT with aberrancy) with longest run being 20 seconds with average heart rate of 165 bpm as well as rare PVCs. Echo in 05/2024 showed normal LV function with no significant valvular disease. She had 2 more syncopal episodes during recent hospitalizations in 05/2024. One sounds clearly vasovagal where she bradyed down with heart rate of 29 while urinating on bedside commode. The other occurred in setting of hypotension and acute blood loss anemia/ acute GI bleed.  - No recurrent syncope since recent admission.   Hypertension History of hypertension; however, she had hypotension during recent admission in 05/2024 in setting of  acute GI bleed.  - BP *** - Continue low dose Toprol -XL as above.  Hyperlipidemia Lipid panel in 05/2024: Total Cholesterol 161, Triglycerides 136, HDL 47, LDL 87. Lipoprotein (a) <8.4. LDL goal <55.  - Pravastatin  was increased to 40mg  daily during recent admission. Continue.  - Will repeat lipid panel and LFTs in 1 months. ***  Iron  Deficiency Anemia Recent GI Bleed Patient recently admitted with acute on chronic anemia and acute GI bleed. Iron  level severely low. She required multiple transfusions of PRBCs as well as IV iron . EGD showed esophageal dilatation, fluid-filled esophagus, esophageal ulcers, and gastritis. Colonoscopy was not able to be completed due to poor prep. Hemoglobin stable at 9.7 on discharge.  - Follow-up with GI as directed.   EKGs/Labs/Other  Studies Reviewed:    The following studies were reviewed:  Monitor 10/12/2024 to 10/26/2024: Summary: The basic rhythm is normal sinus with an average heart rate of 69 bpm. There is no atrial fibrillation or flutter. Rare PVCs with an overall burden of less than 1%. There are a few runs of wide-complex tachycardia which could be VT or SVT with aberrancy. The longest run is 20 seconds with an average ventricular rate of 165 bpm. There is no evidence of high-grade AV block present.  _______________  Echocardiogram 06/07/2024: Impressions: 1. Left ventricular ejection fraction, by estimation, is 50 to 55%. The  left ventricle has low normal function. The left ventricle demonstrates  regional wall motion abnormalities (see scoring diagram/findings for  description). Left ventricular diastolic   parameters were normal. The average left ventricular global longitudinal  strain is -14.3 %. The global longitudinal strain is abnormal.   2. Right ventricular systolic function is normal. The right ventricular  size is normal. There is normal pulmonary artery systolic pressure. The  estimated right ventricular systolic pressure is 21.0 mmHg.   3. The mitral valve is grossly normal. No evidence of mitral valve  regurgitation. No evidence of mitral stenosis.   4. The aortic valve is tricuspid. Aortic valve regurgitation is mild.  Aortic valve sclerosis is present, with no evidence of aortic valve  stenosis.   5. The inferior vena cava is normal in size with greater than 50%  respiratory variability, suggesting right atrial pressure of 3 mmHg.  _______________  Left Cardiac Catheterization 06/07/2024: Conclusions: Stable appearance of coronary arteries compared with last catheterization in 2018; there is mild-moderate, non-obstructive coronary artery disease involving the mid/distal LAD, mid LCx, and proximal/mid LAD. Widely patent overlapping LCx/OM1 stents. Normal left ventricular filling pressure  (LVEDP 10 mmHg).   Recommendations: Continue secondary prevention of coronary artery disease. Consider further evaluation for there potential causes of shortness of breath and elevated HS-TnI/BNP, including pulmonary embolism.  Diagnostic Dominance: Right      EKG:  EKG not ordered today.   Recent Labs: 10/13/2023: TSH 2.190 06/13/2024: B Natriuretic Peptide 541.3 06/14/2024: ALT 12 06/18/2024: BUN 8; Creatinine, Ser 0.62; Hemoglobin 9.7; Magnesium  1.8; Platelets 171; Potassium 3.8; Sodium 137  Recent Lipid Panel    Component Value Date/Time   CHOL 161 06/07/2024 0513   CHOL 152 03/28/2022 1007   CHOL 153 07/29/2014 0926   TRIG 136 06/07/2024 0513   TRIG 112 07/29/2014 0926   HDL 47 06/07/2024 0513   HDL 59 03/28/2022 1007   HDL 49 07/29/2014 0926   CHOLHDL 3.4 06/07/2024 0513   VLDL 27 06/07/2024 0513   LDLCALC 87 06/07/2024 0513   LDLCALC 74 03/28/2022 1007   LDLCALC 90 05/24/2021 0000  LDLCALC 82 07/29/2014 0926    Physical Exam:    Vital Signs: LMP 11/25/1994     Wt Readings from Last 3 Encounters:  06/07/24 104 lb 11.5 oz (47.5 kg)  12/12/23 110 lb (49.9 kg)  12/03/23 110 lb (49.9 kg)     General: 83 y.o. female in no acute distress. HEENT: Normocephalic and atraumatic. Sclera clear.  Neck: Supple. No carotid bruits. No JVD. Heart: *** RRR. Distinct S1 and S2. No murmurs, gallops, or rubs.  Lungs: No increased work of breathing. Clear to ausculation bilaterally. No wheezes, rhonchi, or rales.  Abdomen: Soft, non-distended, and non-tender to palpation.  Extremities: No lower extremity edema.  Radial and distal pedal pulses 2+ and equal bilaterally. Skin: Warm and dry. Neuro: No focal deficits. Psych: Normal affect. Responds appropriately.   Assessment:    No diagnosis found.  Plan:     Disposition: Follow up in ***   Signed, Aline FORBES Door, PA-C  06/26/2024 11:13 PM    [facility]

## 2024-07-01 ENCOUNTER — Ambulatory Visit: Admitting: Cardiology

## 2024-07-06 ENCOUNTER — Emergency Department (HOSPITAL_COMMUNITY)

## 2024-07-06 ENCOUNTER — Inpatient Hospital Stay (HOSPITAL_COMMUNITY)
Admission: EM | Admit: 2024-07-06 | Discharge: 2024-07-12 | DRG: 270 | Disposition: A | Discharge status: Skilled Nursing Facility | Source: Ambulatory Visit | Attending: Internal Medicine | Admitting: Internal Medicine

## 2024-07-06 ENCOUNTER — Encounter (HOSPITAL_COMMUNITY): Payer: Self-pay

## 2024-07-06 ENCOUNTER — Other Ambulatory Visit: Payer: Self-pay

## 2024-07-06 DIAGNOSIS — Z8249 Family history of ischemic heart disease and other diseases of the circulatory system: Secondary | ICD-10-CM | POA: Diagnosis not present

## 2024-07-06 DIAGNOSIS — J4489 Other specified chronic obstructive pulmonary disease: Secondary | ICD-10-CM | POA: Diagnosis present

## 2024-07-06 DIAGNOSIS — Z955 Presence of coronary angioplasty implant and graft: Secondary | ICD-10-CM

## 2024-07-06 DIAGNOSIS — Z96651 Presence of right artificial knee joint: Secondary | ICD-10-CM | POA: Diagnosis present

## 2024-07-06 DIAGNOSIS — I82432 Acute embolism and thrombosis of left popliteal vein: Secondary | ICD-10-CM

## 2024-07-06 DIAGNOSIS — I824Y2 Acute embolism and thrombosis of unspecified deep veins of left proximal lower extremity: Secondary | ICD-10-CM | POA: Diagnosis present

## 2024-07-06 DIAGNOSIS — I871 Compression of vein: Secondary | ICD-10-CM | POA: Diagnosis present

## 2024-07-06 DIAGNOSIS — Z9889 Other specified postprocedural states: Secondary | ICD-10-CM | POA: Diagnosis not present

## 2024-07-06 DIAGNOSIS — K2270 Barrett's esophagus without dysplasia: Secondary | ICD-10-CM | POA: Diagnosis present

## 2024-07-06 DIAGNOSIS — D638 Anemia in other chronic diseases classified elsewhere: Secondary | ICD-10-CM | POA: Diagnosis present

## 2024-07-06 DIAGNOSIS — G4733 Obstructive sleep apnea (adult) (pediatric): Secondary | ICD-10-CM | POA: Diagnosis present

## 2024-07-06 DIAGNOSIS — K219 Gastro-esophageal reflux disease without esophagitis: Secondary | ICD-10-CM | POA: Diagnosis present

## 2024-07-06 DIAGNOSIS — K220 Achalasia of cardia: Secondary | ICD-10-CM | POA: Diagnosis present

## 2024-07-06 DIAGNOSIS — I82422 Acute embolism and thrombosis of left iliac vein: Secondary | ICD-10-CM | POA: Diagnosis present

## 2024-07-06 DIAGNOSIS — I82462 Acute embolism and thrombosis of left calf muscular vein: Secondary | ICD-10-CM | POA: Diagnosis not present

## 2024-07-06 DIAGNOSIS — Z7982 Long term (current) use of aspirin: Secondary | ICD-10-CM

## 2024-07-06 DIAGNOSIS — Z5986 Financial insecurity: Secondary | ICD-10-CM

## 2024-07-06 DIAGNOSIS — H35071 Retinal telangiectasis, right eye: Secondary | ICD-10-CM | POA: Diagnosis present

## 2024-07-06 DIAGNOSIS — E43 Unspecified severe protein-calorie malnutrition: Secondary | ICD-10-CM | POA: Diagnosis present

## 2024-07-06 DIAGNOSIS — I10 Essential (primary) hypertension: Secondary | ICD-10-CM | POA: Diagnosis present

## 2024-07-06 DIAGNOSIS — I2510 Atherosclerotic heart disease of native coronary artery without angina pectoris: Secondary | ICD-10-CM | POA: Diagnosis present

## 2024-07-06 DIAGNOSIS — Z7901 Long term (current) use of anticoagulants: Secondary | ICD-10-CM

## 2024-07-06 DIAGNOSIS — E785 Hyperlipidemia, unspecified: Secondary | ICD-10-CM | POA: Diagnosis present

## 2024-07-06 DIAGNOSIS — K5792 Diverticulitis of intestine, part unspecified, without perforation or abscess without bleeding: Secondary | ICD-10-CM | POA: Diagnosis present

## 2024-07-06 DIAGNOSIS — Z79899 Other long term (current) drug therapy: Secondary | ICD-10-CM

## 2024-07-06 DIAGNOSIS — D500 Iron deficiency anemia secondary to blood loss (chronic): Secondary | ICD-10-CM | POA: Diagnosis present

## 2024-07-06 DIAGNOSIS — M341 CR(E)ST syndrome: Secondary | ICD-10-CM | POA: Diagnosis present

## 2024-07-06 DIAGNOSIS — I255 Ischemic cardiomyopathy: Secondary | ICD-10-CM | POA: Diagnosis present

## 2024-07-06 DIAGNOSIS — Q969 Turner's syndrome, unspecified: Secondary | ICD-10-CM

## 2024-07-06 DIAGNOSIS — Z8616 Personal history of COVID-19: Secondary | ICD-10-CM | POA: Diagnosis not present

## 2024-07-06 DIAGNOSIS — I2699 Other pulmonary embolism without acute cor pulmonale: Secondary | ICD-10-CM | POA: Diagnosis present

## 2024-07-06 DIAGNOSIS — I2693 Single subsegmental pulmonary embolism without acute cor pulmonale: Secondary | ICD-10-CM | POA: Diagnosis present

## 2024-07-06 DIAGNOSIS — I82402 Acute embolism and thrombosis of unspecified deep veins of left lower extremity: Secondary | ICD-10-CM | POA: Diagnosis not present

## 2024-07-06 DIAGNOSIS — Z825 Family history of asthma and other chronic lower respiratory diseases: Secondary | ICD-10-CM

## 2024-07-06 DIAGNOSIS — K9289 Other specified diseases of the digestive system: Secondary | ICD-10-CM | POA: Diagnosis present

## 2024-07-06 DIAGNOSIS — Z800 Family history of malignant neoplasm of digestive organs: Secondary | ICD-10-CM

## 2024-07-06 DIAGNOSIS — Z604 Social exclusion and rejection: Secondary | ICD-10-CM | POA: Diagnosis present

## 2024-07-06 DIAGNOSIS — M810 Age-related osteoporosis without current pathological fracture: Secondary | ICD-10-CM | POA: Diagnosis present

## 2024-07-06 DIAGNOSIS — I252 Old myocardial infarction: Secondary | ICD-10-CM

## 2024-07-06 DIAGNOSIS — Z886 Allergy status to analgesic agent status: Secondary | ICD-10-CM

## 2024-07-06 DIAGNOSIS — D72829 Elevated white blood cell count, unspecified: Secondary | ICD-10-CM | POA: Diagnosis present

## 2024-07-06 DIAGNOSIS — Z6820 Body mass index (BMI) 20.0-20.9, adult: Secondary | ICD-10-CM

## 2024-07-06 DIAGNOSIS — I82412 Acute embolism and thrombosis of left femoral vein: Secondary | ICD-10-CM | POA: Diagnosis not present

## 2024-07-06 DIAGNOSIS — M159 Polyosteoarthritis, unspecified: Secondary | ICD-10-CM | POA: Diagnosis present

## 2024-07-06 DIAGNOSIS — Z90710 Acquired absence of both cervix and uterus: Secondary | ICD-10-CM

## 2024-07-06 LAB — CBC
HCT: 40.1 % (ref 36.0–46.0)
Hemoglobin: 12 g/dL (ref 12.0–15.0)
MCH: 27.1 pg (ref 26.0–34.0)
MCHC: 29.9 g/dL — ABNORMAL LOW (ref 30.0–36.0)
MCV: 90.5 fL (ref 80.0–100.0)
Platelets: 322 K/uL (ref 150–400)
RBC: 4.43 MIL/uL (ref 3.87–5.11)
RDW: 18.9 % — ABNORMAL HIGH (ref 11.5–15.5)
WBC: 16.3 K/uL — ABNORMAL HIGH (ref 4.0–10.5)
nRBC: 0 % (ref 0.0–0.2)

## 2024-07-06 LAB — BASIC METABOLIC PANEL WITH GFR
Anion gap: 11 (ref 5–15)
BUN: 10 mg/dL (ref 8–23)
CO2: 24 mmol/L (ref 22–32)
Calcium: 9.2 mg/dL (ref 8.9–10.3)
Chloride: 103 mmol/L (ref 98–111)
Creatinine, Ser: 0.72 mg/dL (ref 0.44–1.00)
GFR, Estimated: >60 mL/min (ref >60)
Glucose, Bld: 94 mg/dL (ref 70–99)
Potassium: 4.4 mmol/L (ref 3.5–5.1)
Sodium: 138 mmol/L (ref 135–145)

## 2024-07-06 LAB — TROPONIN I (HIGH SENSITIVITY)
Troponin I (High Sensitivity): 15 ng/L (ref <18)
Troponin I (High Sensitivity): 16 ng/L (ref <18)

## 2024-07-06 MED ORDER — IOHEXOL 350 MG/ML SOLN
75.0000 mL | Freq: Once | INTRAVENOUS | Status: AC | PRN
  Administered 2024-07-06 (×2): 75 mL via INTRAVENOUS

## 2024-07-06 MED ORDER — HEPARIN (PORCINE) 25000 UT/250ML-% IV SOLN
800.0000 [IU]/h | INTRAVENOUS | Status: DC
  Administered 2024-07-06 – 2024-07-09 (×5): 800 [IU]/h via INTRAVENOUS
  Filled 2024-07-06 (×3): qty 250

## 2024-07-06 MED ORDER — HEPARIN BOLUS VIA INFUSION
2500.0000 [IU] | Freq: Once | INTRAVENOUS | Status: AC
  Administered 2024-07-06 (×2): 2500 [IU] via INTRAVENOUS
  Filled 2024-07-06: qty 2500

## 2024-07-06 NOTE — ED Triage Notes (Signed)
 PT BIB [facility] from vein clinic. PT reported left leg swelling x 5 days to her PCP who sent her to the vein clinic. At the vein clinic, doppler notated a DVT in her left leg.

## 2024-07-06 NOTE — ED Notes (Signed)
 Called and placed PT on monitor with CCMD.

## 2024-07-06 NOTE — Progress Notes (Signed)
 PHARMACY - ANTICOAGULATION CONSULT NOTE  Pharmacy Consult for heparin  Indication: DVT  Allergies  Allergen Reactions   Nsaids Other (See Comments)    Prone to bleeding   Aspirin  Other (See Comments)    Prone to bleeding OK to take low-dose EC but not aspirin  based medications.    Patient Measurements: Height: 5' (152.4 cm) Weight: 47.5 kg (104 lb 11.5 oz) IBW/kg (Calculated) : 45.5 HEPARIN  DW (KG): 47.5  Vital Signs:    Labs: No results for input(s): HGB, HCT, PLT, APTT, LABPROT, INR, HEPARINUNFRC, HEPRLOWMOCWT, CREATININE, CKTOTAL, CKMB, TROPONINIHS in the last 72 hours.  Estimated Creatinine Clearance: 39.6 mL/min (by C-G formula based on SCr of 0.62 mg/dL).   Medical History: Past Medical History:  Diagnosis Date   Anemia    Arthritis    Asthma    Barrett's esophagus    Stage B   CAD (coronary artery disease)    a. s/p STEMI 7/14 >> LHC: LM 20, mLAD 30-40, small Dx 100 (L-L collats), OM1 90, OM2 30-40, mRCA 50-60, Lat/AL akinesis, EF 35-40% >> PCI: Promus DES to OM1;  b. Myoview 3/16: diaph atten, no ischemia, EF 59% (intermediate risk b/c BP drop 152>>137, but recovery BP 162 (tech error) >> med Rx continued  c. cath 05/30/17 s/p FFR guided PCI with DES to circumflex/first OM    COPD exacerbation (HCC) 06/04/2024   COVID-19 virus infection    CREST variant of scleroderma (HCC)    Cystocele    Diverticulitis    Event monitor 04/2019   Event monitor 04/2019: Sinus rhythm; no atrial fibrillation/flutter; no bradycardic events; few PVCs and occasional ventricular runs up to 9 beats   GERD (gastroesophageal reflux disease)    History of blood transfusion    hx of being Anti-K positive   History of Doppler ultrasound    a. Carotid US  2/12: bilateral ICA < 50%   Hyperlipidemia    Hypertension    Ischemic cardiomyopathy    a. EF 35-40% by LHC at time of MI;  b. Echo 7/14:  Mild focal basal septal hypertrophy, EF 50-55%, apical HK, grade 1  diastolic dysfunction, trivial AI    Ischemic colitis (HCC)    Orthostatic hypotension    OSA on CPAP    Osteoporosis 12/2017   T score -2.6   Raynaud disease    Rectocele    mild     Assessment: 63 YOF presenting from clinic with positive DVT, she is not on anticoagulation PTA, CT angio chest pending for PE rule out as well  Goal of Therapy:  Heparin  level 0.3-0.7 units/ml Monitor platelets by anticoagulation protocol: Yes   Plan:  Heparin  2500 units IV x 1, and gtt at 800 units/hr F/u 8 hour heparin  level F/u CT angio study, and long term [facility] plan  Dorn Poot, PharmD, [facility] Clinical Pharmacist ED Pharmacist Phone # [phone] 07/06/2024 5:38 PM

## 2024-07-06 NOTE — Consult Note (Signed)
 ED consult    Reason for Consult:  LLE DVT  Referring Physician:  Dr. Patsey MRN #:  [id number]  History of Present Illness: This is a 83 y.o. female without history of DVT.  Recently was admitted to the hospital for GI bleed and was in rehab in [place].  For 5 days she has noticed swelling of the left lower extremity.  But denies pain.  States the leg feels very heavy.  She has never had similar symptoms.  She still has sensory and motor intact.  States that the leg does feel somewhat better after heparin  initiation.  Past Medical History:  Diagnosis Date   Anemia    Arthritis    Asthma    Barrett's esophagus    Stage B   CAD (coronary artery disease)    a. s/p STEMI 7/14 >> LHC: LM 20, mLAD 30-40, small Dx 100 (L-L collats), OM1 90, OM2 30-40, mRCA 50-60, Lat/AL akinesis, EF 35-40% >> PCI: Promus DES to OM1;  b. Myoview 3/16: diaph atten, no ischemia, EF 59% (intermediate risk b/c BP drop 152>>137, but recovery BP 162 (tech error) >> med Rx continued  c. cath 05/30/17 s/p FFR guided PCI with DES to circumflex/first OM    COPD exacerbation (HCC) 06/04/2024   COVID-19 virus infection    CREST variant of scleroderma (HCC)    Cystocele    Diverticulitis    Event monitor 04/2019   Event monitor 04/2019: Sinus rhythm; no atrial fibrillation/flutter; no bradycardic events; few PVCs and occasional ventricular runs up to 9 beats   GERD (gastroesophageal reflux disease)    History of blood transfusion    hx of being Anti-K positive   History of Doppler ultrasound    a. Carotid US  2/12: bilateral ICA < 50%   Hyperlipidemia    Hypertension    Ischemic cardiomyopathy    a. EF 35-40% by LHC at time of MI;  b. Echo 7/14:  Mild focal basal septal hypertrophy, EF 50-55%, apical HK, grade 1 diastolic dysfunction, trivial AI    Ischemic colitis (HCC)    Orthostatic hypotension    OSA on CPAP    Osteoporosis 12/2017   T score -2.6   Raynaud disease    Rectocele    mild    Past Surgical  History:  Procedure Laterality Date   ABDOMINAL HYSTERECTOMY  1996   RSO for menorrhagia.   COLONOSCOPY N/A 06/16/2024   Procedure: COLONOSCOPY;  Surgeon: Shila Gustav GAILS, MD;  Location: [facility] ENDOSCOPY;  Service: Gastroenterology;  Laterality: N/A;   CORONARY ANGIOPLASTY WITH STENT PLACEMENT  06/21/2013   CORONARY PRESSURE/FFR STUDY N/A 05/30/2017   Procedure: Intravascular Pressure Wire/FFR Study;  Surgeon: Wonda Sharper, MD;  Location: [facility] INVASIVE CV LAB;  Service: Cardiovascular;  Laterality: N/A;   CORONARY STENT INTERVENTION N/A 05/30/2017   Procedure: Coronary Stent Intervention;  Surgeon: Wonda Sharper, MD;  Location: [facility] INVASIVE CV LAB;  Service: Cardiovascular;  Laterality: N/A;   ESOPHAGOGASTRODUODENOSCOPY N/A 06/16/2024   Procedure: EGD (ESOPHAGOGASTRODUODENOSCOPY);  Surgeon: Nandigam, Kavitha V, MD;  Location: [facility] ENDOSCOPY;  Service: Gastroenterology;  Laterality: N/A;   FOOT SURGERY  2013   x2   JOINT REPLACEMENT  9/10   rt total   LEFT HEART CATH AND CORONARY ANGIOGRAPHY N/A 05/30/2017   Procedure: Left Heart Cath and Coronary Angiography;  Surgeon: Wonda Sharper, MD;  Location[facility] INVASIVE CV LAB;  Service: Cardiovascular;  Laterality: N/A;   LEFT HEART CATH AND CORONARY ANGIOGRAPHY N/A 06/07/2024   Procedure: LEFT  HEART CATH AND CORONARY ANGIOGRAPHY;  Surgeon: Mady Bruckner, MD;  Location: [facility] INVASIVE CV LAB;  Service: Cardiovascular;  Laterality: N/A;   nissen fundoplication  2006   ROTATOR CUFF REPAIR     TONSILLECTOMY  1950   TOTAL KNEE ARTHROPLASTY     right   TUBAL LIGATION  1980    Allergies  Allergen Reactions   Nsaids Other (See Comments)    Prone to bleeding   Aspirin  Other (See Comments)    Prone to bleeding OK to take low-dose EC but not aspirin  based medications.    Prior to Admission medications   Medication Sig Start Date End Date Taking? Authorizing Provider  acetaminophen  (TYLENOL ) 500 MG tablet Take 1,000 mg by mouth every 6 (six) hours as  needed for headache or moderate pain (pain score 4-6).    [provider]  albuterol  (VENTOLIN  HFA) 108 (90 Base) MCG/ACT inhaler Inhale 1 puff into the lungs every 4 (four) hours as needed for wheezing or shortness of breath. 03/14/22   [provider]  aspirin  EC 81 MG EC tablet Take 1 tablet (81 mg total) by mouth daily. Patient taking differently: Take 81 mg by mouth in the morning. 06/24/13   Barrett, Shona MATSU, PA-C  b complex vitamins capsule Take 1 capsule by mouth in the morning.    [provider]  bisacodyl (DULCOLAX) 10 MG suppository Place 10 mg rectally as needed for moderate constipation.    [provider]  famotidine  (PEPCID ) 20 MG tablet Take 40 mg by mouth at bedtime.    [provider]  ferrous sulfate  325 (65 FE) MG tablet Take 1 tablet (325 mg total) by mouth daily with breakfast. 06/11/24   Ezenduka, Nkeiruka J, MD  fluticasone  furoate-vilanterol (BREO ELLIPTA ) 100-25 MCG/ACT AEPB Inhale 1 puff into the lungs daily. Patient taking differently: Inhale 1 puff into the lungs in the morning. 06/12/24   Ezenduka, Nkeiruka J, MD  ipratropium-albuterol  (DUONEB) 0.5-2.5 (3) MG/3ML SOLN Take 3 mLs by nebulization every 6 (six) hours as needed. 06/11/24   Ezenduka, Nkeiruka J, MD  magnesium  hydroxide (MILK OF MAGNESIA) 400 MG/5ML suspension Take 30 mLs by mouth as needed for mild constipation.    [provider]  MAGNESIUM  PO Take 1 tablet by mouth in the morning.    [provider]  metoprolol  succinate (TOPROL  XL) 25 MG 24 hr tablet Take 0.5 tablets (12.5 mg total) by mouth daily. Patient taking differently: Take 12.5 mg by mouth in the morning. 07/30/23   Lelon Hamilton T, PA-C  nitroGLYCERIN  (NITROSTAT ) 0.4 MG SL tablet Place 1 tablet (0.4 mg total) under the tongue every 5 (five) minutes x 3 doses as needed for chest pain. 06/11/23   Lelon Hamilton T, PA-C  Nutritional Supplements (NUTRITIONAL SUPPLEMENT PO) Take 120 mLs by mouth  in the morning and at bedtime. [employer]    [provider]  OLIVE LEAF EXTRACT PO Take 1 capsule by mouth in the morning.    [provider]  pantoprazole  (PROTONIX ) 40 MG tablet Take 1 tablet (40 mg total) by mouth 2 (two) times daily. 06/18/24   Jillian Buttery, MD  pravastatin  (PRAVACHOL ) 40 MG tablet Take 1 tablet (40 mg total) by mouth every evening. 06/11/24   Ezenduka, Nkeiruka J, MD  Quercetin 250 MG TABS Take 250 mg by mouth in the morning. 06/12/24   [provider]  senna-docusate (SENOKOT-S) 8.6-50 MG tablet Take 1 tablet by mouth 2 (two) times daily. 06/11/24  Ezenduka, Nkeiruka J, MD  SODIUM PHOSPHATES RE Place 1 Insert rectally as needed. Enema    [provider]  sucralfate  (CARAFATE ) 1 g tablet Take 1 tablet (1 g total) by mouth 4 (four) times daily -  with meals and at bedtime. 06/18/24   Jillian Buttery, MD    Social History   Socioeconomic History   Marital status: Divorced    Spouse name: Not on file   Number of children: Not on file   Years of education: Not on file   Highest education level: Not on file  Occupational History   Not on file  Tobacco Use   Smoking status: Never   Smokeless tobacco: Never  Vaping Use   Vaping status: Never Used  Substance and Sexual Activity   Alcohol use: No    Alcohol/week: 0.0 standard drinks of alcohol   Drug use: No   Sexual activity: Never    Comment: 1st intercourse 67 yo-1 partner  Other Topics Concern   Not on file  Social History Narrative   Exercise dancing 3 x times weekly for 1-2 hours   Social Drivers of Health   Financial Resource Strain: High Risk (06/29/2024)   Received from [facility]   Overall Financial Resource Strain (CARDIA)    How hard is it for you to pay for the very basics like food, housing, medical care, and heating?: Hard  Food Insecurity: Food Insecurity Present (06/29/2024)   Received from [facility]   Hunger Vital Sign    Within the past 12 months, you  worried that your food would run out before you got the money to buy more.: Sometimes true    Within the past 12 months, the food you bought just didn't last and you didn't have money to get more.: Patient declined  Transportation Needs: No Transportation Needs (06/29/2024)   Received from [facility] - Transportation    In the past 12 months, has lack of transportation kept you from medical appointments or from getting medications?: No    In the past 12 months, has lack of transportation kept you from meetings, work, or from getting things needed for daily living?: No  Physical Activity: Inactive (06/29/2024)   Received from [facility]   Exercise Vital Sign    On average, how many days per week do you engage in moderate to strenuous exercise (like a brisk walk)?: 0 days    Minutes of Exercise per Session: Not on file  Stress: Stress Concern Present (06/29/2024)   Received from [facility] - Occupational Stress Questionnaire    Do you feel stress - tense, restless, nervous, or anxious, or unable to sleep at night because your mind is troubled all the time - these days?: To some extent  Social Connections: Somewhat Isolated (06/29/2024)   Received from [facility]   Social Network    How would you rate your social network (family, work, friends)?: Restricted participation with some degree of social isolation  Intimate Partner Violence: Not At Risk (06/29/2024)   Received from [facility]   HITS    Over the last 12 months how often did your partner physically hurt you?: Never    Over the last 12 months how often did your partner insult you or talk down to you?: Never    Over the last 12 months how often did your partner threaten you with physical harm?: Never    Over the last  12 months how often did your partner scream or curse at you?: Never     Family History  Problem Relation Age of Onset   Cancer Mother        Pancreatic Cancer    Hepatitis C Mother    Heart disease Father        congestive heart failure   Heart attack Father    Panic disorder Daughter    Hypertension Daughter    Asthma Maternal Grandmother    Emphysema Maternal Grandmother    Cancer Maternal Grandfather        lung   Stroke Neg Hx     Review of Systems  Constitutional:  Positive for weight loss.  HENT: Negative.    Eyes: Negative.   Respiratory: Negative.    Cardiovascular:  Positive for leg swelling.  Gastrointestinal: Negative.   Musculoskeletal: Negative.   Skin: Negative.   Neurological: Negative.   Endo/Heme/Allergies: Negative.   Psychiatric/Behavioral: Negative.        Physical Examination  Vitals:   07/06/24 1733  BP: (!) 128/56  Pulse: 87  Resp: (!) 23  SpO2: 97%   Body mass index is 20.45 kg/m.  Physical Exam HENT:     Head: Normocephalic.     Nose: Nose normal.     Mouth/Throat:     Mouth: Mucous membranes are moist.  Cardiovascular:     Comments: I cannot reliably feel pulses in the left due to edema in the foot and ankle Pulmonary:     Effort: Pulmonary effort is normal.  Abdominal:     General: Abdomen is flat.  Musculoskeletal:        General: Swelling present.     Right lower leg: No edema.     Left lower leg: Edema present.  Skin:    Capillary Refill: Capillary refill takes less than 2 seconds.  Neurological:     General: No focal deficit present.     Mental Status: She is alert. Mental status is at baseline.      CBC    Component Value Date/Time   WBC 11.6 (H) 06/18/2024 0153   RBC 3.31 (L) 06/18/2024 0153   HGB 9.7 (L) 06/18/2024 0153   HGB 11.1 10/13/2023 0951   HCT 30.2 (L) 06/18/2024 0153   HCT 38.2 10/13/2023 0951   PLT 171 06/18/2024 0153   PLT 207 10/13/2023 0951   MCV 91.2 06/18/2024 0153   MCV 80 10/13/2023 0951   MCH 29.3 06/18/2024 0153   MCHC 32.1 06/18/2024 0153   RDW 21.4 (H) 06/18/2024 0153   RDW 17.1 (H) 10/13/2023 0951   LYMPHSABS 1.4 06/13/2024 0932    LYMPHSABS 2.1 03/23/2019 0000   MONOABS 2.7 (H) 06/13/2024 0932   EOSABS 0.1 06/13/2024 0932   EOSABS 0.2 03/23/2019 0000   BASOSABS 0.1 06/13/2024 0932   BASOSABS 0.1 03/23/2019 0000    BMET    Component Value Date/Time   NA 137 06/18/2024 0153   NA 139 10/13/2023 0951   K 3.8 06/18/2024 0153   CL 107 06/18/2024 0153   CO2 22 06/18/2024 0153   GLUCOSE 99 06/18/2024 0153   BUN 8 06/18/2024 0153   BUN 16 10/13/2023 0951   CREATININE 0.62 06/18/2024 0153   CREATININE 0.75 05/24/2021 0000   CALCIUM  8.1 (L) 06/18/2024 0153   GFRNONAA >60 06/18/2024 0153   GFRNONAA 76 05/24/2021 0000   GFRAA 88 05/24/2021 0000    COAGS: Lab Results  Component Value Date   INR 0.9  05/26/2017   INR 1.6 (H) 08/14/2009   INR 1.2 08/13/2009     Non-Invasive Vascular Imaging:   EXAM: CT VENOGRAM ABDOMEN AND PELVIS AND LOWER EXTREMITY BILATERAL   TECHNIQUE: Venographic phase images of the abdomen, pelvis and lower extremities were obtained following the administration of intravenous contrast. Multiplanar reformats and maximum intensity projections were generated.   RADIATION DOSE REDUCTION: This exam was performed according to the departmental dose-optimization program which includes automated exposure control, adjustment of the mA and/or kV according to patient size and/or use of iterative reconstruction technique.   CONTRAST:  75mL OMNIPAQUE  IOHEXOL  350 MG/ML SOLN   COMPARISON:  CT from earlier in the same day.   FINDINGS: Hepatobiliary: No focal liver abnormality is seen. No gallstones, gallbladder wall thickening, or biliary dilatation.   Pancreas: Unremarkable. No pancreatic ductal dilatation or surrounding inflammatory changes.   Spleen: Normal in size without focal abnormality.   Adrenals/Urinary Tract: Adrenal glands are within normal limits. Kidneys demonstrate a normal enhancement pattern bilaterally. No renal calculi or obstructive changes are noted. The bladder  is decompressed.   Stomach/Bowel: Scattered fecal material is noted throughout the colon. Diverticular changes noted without evidence of diverticulitis. No obstructive changes are seen. The appendix is not well visualized and may have been surgically removed. Small bowel and stomach are unremarkable.   Vascular/Lymphatic: Aortic atherosclerosis. No enlarged abdominal or pelvic lymph nodes   Reproductive: Status post hysterectomy. No adnexal masses.   Other: No abdominal wall hernia or abnormality. No abdominopelvic ascites.   Musculoskeletal: Degenerative changes of the thoracolumbar spine are noted. Mild scoliosis concave to the left is seen in the upper lumbar spine. No acute bony abnormality is seen. Extensive soft tissue swelling and edema is noted in the left leg.   IVC: Within normal limits.   Portal and mesenteric veins: Within normal limits.   Bilateral iliac veins: Right iliac vein is widely patent. Left iliac vein demonstrates diffuse deep venous thrombosis involving the common, external and internal iliac venous systems.   Right lower extremity: Right lower extremity shows no evidence of deep venous thrombosis.   Left lower extremity: Left lower extremity shows extensive DVT extending from at least the confluence of the calf veins through the popliteal vein, superficial femoral vein and common femoral vein. Some profundus femoral clot is noted as well. Extensive edema is noted in the left lower leg consistent with the deep venous thrombosis.   IMPRESSION: Extensive left-sided DVT extending from at least the confluence of the left calf veins to the confluence of the iliac veins. Extensive edema is noted consistent with the thrombus.   No involvement of the IVC is noted.   Diverticulosis without diverticulitis.    ASSESSMENT/PLAN: This is a 83 y.o. female without significant clotting disorder now has been deconditioned and rehab appears to have May Thurner  syndrome by CT venogram.  I discussed the options being continued heparin  therapy and watchful waiting possible delayed intervention versus immediate intervention.  I discussed this with the daughter via telephone of the patient in person.  Plan is for left lower extremity venography from a prone position tomorrow with thrombectomy possible stenting for May-Thurner's.  We discussed the risk including pulmonary embolism and they demonstrate good understanding.  All questions were answered.  She is okay for diet tonight.  Larhonda Dettloff C. Sheree, MD [facility] Office: [phone] Pager: [phone]

## 2024-07-06 NOTE — H&P (Signed)
 History and Physical    Patient: Kristine [name] FMW:[id number] DOB: Feb 24, 1941 DOA: 07/06/2024 DOS: the patient was seen and examined on 07/06/2024 PCP: Ileen Rosaline NOVAK, NP  Patient coming from: Home  Chief Complaint:  Chief Complaint  Patient presents with   DVT   HPI: Kristine [name] is a 83 y.o. female with medical history significant of crest syndrome, GERD, coronary artery disease, Barrett's esophagus, anemia of chronic disease, essential hypertension, hyperlipidemia, ischemic cardiomyopathy, coronary artery disease, obstructive sleep apnea, osteoporosis, Raynaud's disease, diverticular disease, COPD and asthma who was recently admitted on July 28 with suspected GI bleed.  She lives in [place].  She had diarrhea weakness and low blood pressure at the time.  Patient also had NSTEMI back in July 10.  Patient brought in today with swelling of the left lower extremity.  It was markedly swollen warm and tender.  She went to the pain clinic today reports that the swelling has been going on for about 5 days.  Her PCP sent her there first.  Doppler ultrasound was performed that showed extensive DVT of the left lower extremity so she was sent to the ER for further evaluation and treatment.  In the ER further evaluation confirmed the DVT with CT venogram done showed extensive left-sided DVT from at least the confluence of the left calf veins to the compress of the iliac veins.  Subsequent CT angiogram of the chest showed a small right lower lobe subsegmental pulmonary embolus.  No right heart strain.  Vascular surgery was consulted and plan to do thrombectomy tomorrow.  Patient admitted to the medical service on heparin .  Review of Systems: As mentioned in the history of present illness. All other systems reviewed and are negative. Past Medical History:  Diagnosis Date   Anemia    Arthritis    Asthma    Barrett's esophagus    Stage B   CAD (coronary artery disease)    a. s/p STEMI 7/14 >> LHC: LM  20, mLAD 30-40, small Dx 100 (L-L collats), OM1 90, OM2 30-40, mRCA 50-60, Lat/AL akinesis, EF 35-40% >> PCI: Promus DES to OM1;  b. Myoview 3/16: diaph atten, no ischemia, EF 59% (intermediate risk b/c BP drop 152>>137, but recovery BP 162 (tech error) >> med Rx continued  c. cath 05/30/17 s/p FFR guided PCI with DES to circumflex/first OM    COPD exacerbation (HCC) 06/04/2024   COVID-19 virus infection    CREST variant of scleroderma (HCC)    Cystocele    Diverticulitis    Event monitor 04/2019   Event monitor 04/2019: Sinus rhythm; no atrial fibrillation/flutter; no bradycardic events; few PVCs and occasional ventricular runs up to 9 beats   GERD (gastroesophageal reflux disease)    History of blood transfusion    hx of being Anti-K positive   History of Doppler ultrasound    a. Carotid US  2/12: bilateral ICA < 50%   Hyperlipidemia    Hypertension    Ischemic cardiomyopathy    a. EF 35-40% by LHC at time of MI;  b. Echo 7/14:  Mild focal basal septal hypertrophy, EF 50-55%, apical HK, grade 1 diastolic dysfunction, trivial AI    Ischemic colitis (HCC)    Orthostatic hypotension    OSA on CPAP    Osteoporosis 12/2017   T score -2.6   Raynaud disease    Rectocele    mild   Past Surgical History:  Procedure Laterality Date   ABDOMINAL HYSTERECTOMY  1996   RSO  for menorrhagia.   COLONOSCOPY N/A 06/16/2024   Procedure: COLONOSCOPY;  Surgeon: Shila Gustav GAILS, MD;  Location: [facility] ENDOSCOPY;  Service: Gastroenterology;  Laterality: N/A;   CORONARY ANGIOPLASTY WITH STENT PLACEMENT  06/21/2013   CORONARY PRESSURE/FFR STUDY N/A 05/30/2017   Procedure: Intravascular Pressure Wire/FFR Study;  Surgeon: Wonda Sharper, MD;  Location: [facility] INVASIVE CV LAB;  Service: Cardiovascular;  Laterality: N/A;   CORONARY STENT INTERVENTION N/A 05/30/2017   Procedure: Coronary Stent Intervention;  Surgeon: Wonda Sharper, MD;  Location: [facility] INVASIVE CV LAB;  Service: Cardiovascular;  Laterality: N/A;    ESOPHAGOGASTRODUODENOSCOPY N/A 06/16/2024   Procedure: EGD (ESOPHAGOGASTRODUODENOSCOPY);  Surgeon: Nandigam, Kavitha V, MD;  Location: [facility] ENDOSCOPY;  Service: Gastroenterology;  Laterality: N/A;   FOOT SURGERY  2013   x2   JOINT REPLACEMENT  9/10   rt total   LEFT HEART CATH AND CORONARY ANGIOGRAPHY N/A 05/30/2017   Procedure: Left Heart Cath and Coronary Angiography;  Surgeon: Wonda Sharper, MD;  Location: [facility] INVASIVE CV LAB;  Service: Cardiovascular;  Laterality: N/A;   LEFT HEART CATH AND CORONARY ANGIOGRAPHY N/A 06/07/2024   Procedure: LEFT HEART CATH AND CORONARY ANGIOGRAPHY;  Surgeon: Mady Bruckner, MD;  Location: [facility] INVASIVE CV LAB;  Service: Cardiovascular;  Laterality: N/A;   nissen fundoplication  2006   ROTATOR CUFF REPAIR     TONSILLECTOMY  1950   TOTAL KNEE ARTHROPLASTY     right   TUBAL LIGATION  1980   Social History:  reports that she has never smoked. She has never used smokeless tobacco. She reports that she does not drink alcohol and does not use drugs.  Allergies  Allergen Reactions   Nsaids Other (See Comments)    Prone to bleeding   Aspirin  Other (See Comments)    Prone to bleeding OK to take low-dose EC but not aspirin  based medications.    Family History  Problem Relation Age of Onset   Cancer Mother        Pancreatic Cancer   Hepatitis C Mother    Heart disease Father        congestive heart failure   Heart attack Father    Panic disorder Daughter    Hypertension Daughter    Asthma Maternal Grandmother    Emphysema Maternal Grandmother    Cancer Maternal Grandfather        lung   Stroke Neg Hx     Prior to Admission medications   Medication Sig Start Date End Date Taking? Authorizing Provider  acetaminophen  (TYLENOL ) 500 MG tablet Take 1,000 mg by mouth every 6 (six) hours as needed for headache or moderate pain (pain score 4-6).   Yes [provider]  albuterol  (VENTOLIN  HFA) 108 (90 Base) MCG/ACT inhaler Inhale 1 puff into the lungs  every 4 (four) hours as needed for wheezing or shortness of breath. 03/14/22  Yes [provider]  aspirin  EC 81 MG EC tablet Take 1 tablet (81 mg total) by mouth daily. Patient taking differently: Take 81 mg by mouth in the morning. 06/24/13  Yes Barrett, Rhonda G, PA-C  b complex vitamins capsule Take 1 capsule by mouth in the morning.   Yes [provider]  bisacodyl (DULCOLAX) 10 MG suppository Place 10 mg rectally as needed for moderate constipation.   Yes [provider]  famotidine  (PEPCID ) 20 MG tablet Take 40 mg by mouth at bedtime.   Yes [provider]  ferrous sulfate  325 (65 FE) MG tablet Take 1 tablet (325 mg  total) by mouth daily with breakfast. 06/11/24  Yes Ezenduka, Nkeiruka J, MD  fluticasone  furoate-vilanterol (BREO ELLIPTA ) 100-25 MCG/ACT AEPB Inhale 1 puff into the lungs daily. Patient taking differently: Inhale 1 puff into the lungs in the morning. 06/12/24  Yes Ezenduka, Nkeiruka J, MD  ipratropium-albuterol  (DUONEB) 0.5-2.5 (3) MG/3ML SOLN Take 3 mLs by nebulization every 6 (six) hours as needed. 06/11/24  Yes Donnamarie Lebron PARAS, MD  magnesium  hydroxide (MILK OF MAGNESIA) 400 MG/5ML suspension Take 30 mLs by mouth as needed for mild constipation.   Yes [provider]  MAGNESIUM  PO Take 1 tablet by mouth in the morning.   Yes [provider]  metoprolol  succinate (TOPROL  XL) 25 MG 24 hr tablet Take 0.5 tablets (12.5 mg total) by mouth daily. Patient taking differently: Take 12.5 mg by mouth in the morning. 07/30/23  Yes Weaver, Scott T, PA-C  nitroGLYCERIN  (NITROSTAT ) 0.4 MG SL tablet Place 1 tablet (0.4 mg total) under the tongue every 5 (five) minutes x 3 doses as needed for chest pain. 06/11/23  Yes Weaver, Scott T, PA-C  Nutritional Supplements (NUTRITIONAL SUPPLEMENT PO) Take 120 mLs by mouth in the morning and at bedtime. [employer]   Yes [provider]  OLIVE LEAF EXTRACT PO Take 1 capsule by mouth in the morning.    Yes [provider]  pantoprazole  (PROTONIX ) 40 MG tablet Take 1 tablet (40 mg total) by mouth 2 (two) times daily. 06/18/24  Yes Jillian Buttery, MD  pravastatin  (PRAVACHOL ) 40 MG tablet Take 1 tablet (40 mg total) by mouth every evening. 06/11/24  Yes Ezenduka, Nkeiruka J, MD  Quercetin 250 MG TABS Take 250 mg by mouth in the morning. 06/12/24  Yes [provider]  senna-docusate (SENOKOT-S) 8.6-50 MG tablet Take 1 tablet by mouth 2 (two) times daily. 06/11/24  Yes Ezenduka, Nkeiruka J, MD  SODIUM PHOSPHATES RE Place 1 Insert rectally as needed. Enema   Yes [provider]  sucralfate  (CARAFATE ) 1 g tablet Take 1 tablet (1 g total) by mouth 4 (four) times daily -  with meals and at bedtime. 06/18/24  Yes Jillian Buttery, MD    Physical Exam: Vitals:   07/06/24 1717 07/06/24 1733  BP:  (!) 128/56  Pulse:  87  Resp:  (!) 23  SpO2:  97%  Weight: 47.5 kg   Height: 5' (1.524 m)    Constitutional: Very pleasant, chronically ill looking NAD, calm, comfortable Eyes: PERRL, lids and conjunctivae normal ENMT: Mucous membranes are moist. Posterior pharynx clear of any exudate or lesions.Normal dentition.  Neck: normal, supple, no masses, no thyromegaly Respiratory: clear to auscultation bilaterally, no wheezing, no crackles. Normal respiratory effort. No accessory muscle use.  Cardiovascular: Regular rate and rhythm, no murmurs / rubs / gallops. No extremity edema. 2+ pedal pulses. No carotid bruits.  Abdomen: no tenderness, no masses palpated. No hepatosplenomegaly. Bowel sounds positive.  Musculoskeletal: Left lower extremity markedly swollen, warm, tender good range of motion, no joint swelling or tenderness, Skin: no rashes, lesions, ulcers. No induration Neurologic: CN 2-12 grossly intact. Sensation intact, DTR normal. Strength 5/5 in all 4.  Psychiatric: Normal judgment and insight. Alert and oriented x 3. Normal mood  Data Reviewed:  Blood pressure is 120/56,  pulse is 87.  White count 16.3, hemoglobin is 12.0.  CT angiogram of the chest showed right lower lobe segmental pulmonary embolus without evidence of right heart strain.  There is bilateral pleural effusion right considerably greater than the left with compressive atelectasis.  CT venogram of abdomen pelvis showed extensive left-sided DVT extending from at least the confluence of the left Veins to the compress of the iliac veins.  There is extensive edema noted consistent with thrombosed also diverticulosis without diverticulitis.  Assessment and Plan:  #1 pulmonary embolism: Most likely from the left lower extremity DVT.  Risk factors probably the crest syndrome versus recent hospitalization.  Patient was suspected to have had GI bleed apparently recently but had workup that was negative.  Will initiate IV heparin  drip and observe patient for possible bleed.  Admit to medical service.  Strict bedrest for the first 24 hours.  Vascular surgery plan to do thrombectomy in the morning.  I will keep her n.p.o. after midnight.  #2 extensive left lower extremity DVT: Will continue with heparin  drip as above.  Patient will likely be transition to Eliquis  at discharge.  #3 Crest syndrome: Chronic.  Patient has multiple symptoms and has been dealing with it for almost 50 years.  Continue monitoring.  #4 GERD: Patient also has had achalasia.  Continue PPIs  #5 coronary artery disease: Will continue home regimen as much as possible.  She is status post stent placement.  #6 history of iron  deficiency anemia: Continue iron  supplementation.  Hemoglobin is 12 g today.  #7 essential hypertension: Patient on metoprolol  and lisinopril .  Will continue  #8 hyperlipidemia: Resume pravastatin .  #9 obstructive sleep apnea: Patient uses CPAP at home.  Will get CPAP in the hospital.    Advance Care Planning:   Code Status: Prior full code  Consults: Vascular surgery Dr. Sheree  Family Communication: Daughter over  the phone  Severity of Illness: The appropriate patient status for this patient is INPATIENT. Inpatient status is judged to be reasonable and necessary in order to provide the required intensity of service to ensure the patient's safety. The patient's presenting symptoms, physical exam findings, and initial radiographic and laboratory data in the context of their chronic comorbidities is felt to place them at high risk for further clinical deterioration. Furthermore, it is not anticipated that the patient will be medically stable for discharge from the hospital within 2 midnights of admission.   * I certify that at the point of admission it is my clinical judgment that the patient will require inpatient hospital care spanning beyond 2 midnights from the point of admission due to high intensity of service, high risk for further deterioration and high frequency of surveillance required.*  AuthorBETHA SIM KNOLL, MD 07/06/2024 9:41 PM  For on call review [email or web address].

## 2024-07-06 NOTE — Progress Notes (Signed)
 [address] [place] [address] [phone]  Gastroenterology Follow-up   Subjective   Patient ID:  Kristine [name] is a 83 y.o. (DOB 07-14-1941) female.   CC:     Patient presents with  . Barrett's esophagus without dysplasia     HPI:  Kristine [name] is an 83 year old Caucasian female with a history of CREST syndrome, Barrett's esophagus, and acid reflux; who presents for a follow up.  Her other comorbidities include coronary artery disease, congestive heart failure, obstructive sleep apnea on CPAP at home, reactive airway disease, and CREST syndrome.  She was recently discharged after hospitalization as she had a near syncopal event at her care facility.  There was also a question of black tarry stools.  On admission, she underwent imaging which revealed a markedly dilated esophagus with an air-fluid level.  She underwent an upper endoscopy revealing multiple esophageal ulcers and nondysplastic Barrett's esophagus (as per daughter's interpretation and biopsy results).  She was eventually discharged home without event.  Today, patient states that overall she feels fair from a gastrointestinal standpoint.  She no longer has overt bleeding.  She does have occasional shortness of breath on exertion.  Review of Systems:  Review of Systems  Constitutional:  Negative for chills, fever, malaise/fatigue and weight loss.  Respiratory:  Negative for cough and shortness of breath.   Cardiovascular:  Negative for chest pain, palpitations and orthopnea.  Musculoskeletal:  Negative for joint pain.  Skin:  Negative for rash.  Neurological:  Negative for dizziness and headaches.  Psychiatric/Behavioral:  Negative for depression and suicidal ideas.    All other systems reviewed were negative unless stated above  Past Medical History:  Diagnosis Date  . Anemia   . Colon polyp   . Coronary artery disease   . CREST syndrome (*)   . GERD (gastroesophageal reflux disease)   . Hyperlipidemia    . Hypertension   . Mononucleosis 05/06/2021  . Myocardial infarction (*)   . Respiratory distress 01/2024   [facility]    Medications: Medications Taking[1]   Allergies[2]   Social History[3]  Family History  Problem Relation Age of Onset  . Heart attack Father   . Heart failure Father   . Pancreatic cancer Mother   . Colon cancer Neg Hx   . Colon polyps Neg Hx     Objective   BP 109/73 (BP Location: Left Upper Arm, Patient Position: Sitting)   Pulse 106   Temp 98 F (36.7 C) (Oral)   Ht 5' (1.524 m)   Wt 106 lb 3.2 oz (48.2 kg)   LMP  (LMP Unknown)   BMI 20.74 kg/m    Physical Exam Constitutional:      Appearance: Normal appearance. She is ill-appearing.  HENT:     Head: Normocephalic and atraumatic.  Eyes:     General:        Right eye: No discharge.        Left eye: No discharge.  Cardiovascular:     Rate and Rhythm: Normal rate and regular rhythm.     Pulses: Normal pulses.     Heart sounds: Normal heart sounds. No murmur heard.    No friction rub. No gallop.  Pulmonary:     Effort: Pulmonary effort is normal. No respiratory distress.     Breath sounds: Normal breath sounds. No stridor. No wheezing, rhonchi or rales.  Chest:     Chest wall: No tenderness.  Abdominal:     General: Abdomen  is flat. There is no distension.     Palpations: Abdomen is soft. There is no mass.     Tenderness: There is no abdominal tenderness. There is no guarding or rebound.     Hernia: No hernia is present.  Musculoskeletal:        General: No swelling or deformity. Normal range of motion.     Cervical back: Normal range of motion and neck supple.  Skin:    General: Skin is warm and dry.  Neurological:     Mental Status: She is alert.  Psychiatric:        Mood and Affect: Mood normal.        Behavior: Behavior normal.        Thought Content: Thought content normal.        Judgment: Judgment normal.        Visit Diagnoses   1. CREST (calcinosis,  Raynaud's phenomenon, esophageal dysfunction, sclerodactyly, telangiectasia) (*)   2. Gastroesophageal reflux disease   3. Ulcer of esophagus with bleeding      Impression/Plan   Esophageal dysmotility: patient with significant dysmotility resulting in ulceration in the distal esophagus, nondysplastic Barrett's epithelium, and possible compression of the lungs due to a markedly dilated esophagus at times.  She has been counseled once again on safe swallowing techniques.  Patient also has the presence of a large hiatal hernia but given her advanced age and comorbidities is not a candidate for surgical repair.  I discussed with patient that we will continue conservative management with gastric acid suppression therapy.  If patient has recurrent episodes of chest pain, shortness of breath, or aspiration, we may need to consider alternative means of feeding potentially as she has been noted to have an abundant amount of food within the esophagus during multiple endoscopic evaluations in the past.  My Orders   No orders of the defined types were placed in this encounter.    Discontinued Medications   No medications on file   Modified Medications   Modified Medication Previous Medication   PANTOPRAZOLE  SODIUM (PROTONIX ) 40 MG TABLET pantoprazole  sodium (PROTONIX ) 40 mg tablet      Take one tablet (40 mg dose) by mouth 2 (two) times daily.    TAKE 1 TABLET BY MOUTH DAILY   New Prescriptions   No medications on file    Follow up in about 5 weeks (around 08/06/2024).   Thank you for allowing me to take part in the care of this patient.                 [1] Outpatient Medications Marked as Taking for the 07/02/24 encounter (Office Visit) with Staci SHAUNNA Backers, MD  Medication Sig Dispense Refill  . acetaminophen  (TYLENOL ) 500 mg tablet Take two tablets (1,000 mg dose) by mouth every 6 (six) hours as needed for Pain.    . albuterol  sulfate HFA (PROVENTIL ,VENTOLIN ,PROAIR ) 108 (90 Base)  MCG/ACT inhaler     . ALPRAZolam  (XANAX ) 0.25 mg tablet Take 1/2-1 tablet as needed for anxiety    . aspirin  (ASPRI-LOW,ECOTRIN LOW DOSE) 81 mg EC tablet Take one tablet (81 mg dose) by mouth every morning.    SABRA b complex vitamins capsule Take one capsule by mouth every morning.    . budesonide -formoterol  (SYMBICORT ) 80-4.5 mcg/actuation inhaler Inhale two puffs into the lungs 2 (two) times daily. Rinse mouth after each use. (Patient taking differently: Inhale two puffs into the lungs 2 (two) times daily. Rinse mouth after each use. As  needed) 10.2 g 11  . Cholecalciferol 2000 units TABS Take one tablet by mouth every morning.    . ezetimibe  (ZETIA ) 10 MG tablet Take one half tablet (5 mg dose) by mouth daily.    . famotidine  (PEPCID ) 40 mg tablet TAKE 1 TABLET BY MOUTH AT  BEDTIME 90 tablet 1  . FERROUS GLUCONATE IRON  PO Take by mouth.    SABRA ipratropium (ATROVENT ) 0.02% nebulizer solution Take 2.5 mLs (0.5 mg dose) by nebulization 4 (four) times a day as needed for Wheezing.    . Magnesium  100 MG TABS Take 1 tablet by mouth daily.    . metoprolol  succinate (TOPROL -XL) 25 mg 24 hr tablet Take one half tablet (12.5 mg dose) by mouth every morning.    . niacinamide 500 MG tablet Take one tablet (500 mg dose) by mouth every morning.    . nitroGLYCERIN  (NITROSTAT ) 0.4 MG SL tablet Place one tablet (0.4 mg dose) under the tongue every 5 (five) minutes as needed for Chest pain.    SABRA nystatin (MYCOSTATIN) 100000 UNIT/ML suspension Take by mouth.    . [DISCONTINUED] pantoprazole  sodium (PROTONIX ) 40 mg tablet TAKE 1 TABLET BY MOUTH DAILY (Patient taking differently: Take one tablet (40 mg dose) by mouth every morning.) 90 tablet 3  . pravastatin  sodium (PRAVACHOL ) 10 mg tablet Take one tablet (10 mg dose) by mouth daily.    . sucralfate  (CARAFATE ) 1 g tablet Take one tablet (1 g dose) by mouth.    . traMADol  (ULTRAM ) 50 mg tablet     . traMADol  (ULTRAM ) 50 mg tablet Take one tablet (50 mg dose) by mouth  every 8 (eight) hours as needed.    SABRA UNABLE TO FIND Quercetin supplement daily    . zinc gluconate 50 MG tablet Take one tablet (50 mg dose) by mouth daily.    [2] Allergies Allergen Reactions  . Aspirin  Other    Can take 81 mg but not aspirin  based medications  . Excedrin Extra Strength Other  . Nsaids Rash    Prone to bleeding  . Other Other    Anti K antigen: could cause transfusion reaction  . Pravastatin  Other    Other reaction(s): Other (See Comments) elevated LFT'S, tolerates 1/2 dose of zetia  and 1/2 dose of pravastatin   [3] Social History Socioeconomic History  . Marital status: Divorced  . Number of children: 1  . Years of education: 80  Occupational History  . Occupation: Geneticist, molecular    Employer: [employer].  Tobacco Use  . Smoking status: Never  . Smokeless tobacco: Never  Vaping Use  . Vaping status: Never Used  Substance and Sexual Activity  . Alcohol use: Not Currently    Alcohol/week: 1.0 standard drink of alcohol    Types: 1 Glasses of wine per week    Comment: Occassional  . Drug use: No

## 2024-07-06 NOTE — ED Notes (Signed)
 Called and placed PT on monitor with CCMD

## 2024-07-06 NOTE — ED Provider Notes (Signed)
 [place] EMERGENCY DEPARTMENT AT [facility] Provider Note   CSN: [id number] Arrival date & time: 07/06/24  1708     Patient presents with: DVT   Kristine [name] is a 83 y.o. female.   HPI Patient presents in clinic with DVT.  Has had swelling in the left leg for around 5 days.  Unfortunate did have a recent visit to the hospital at admission for GI bleed with anemia requiring transfusion.  No chest pain or trouble breathing.  No pain in the leg.  Has a history of crest syndrome but has not had previous clots.   Past Medical History:  Diagnosis Date   Anemia    Arthritis    Asthma    Barrett's esophagus    Stage B   CAD (coronary artery disease)    a. s/p STEMI 7/14 >> LHC: LM 20, mLAD 30-40, small Dx 100 (L-L collats), OM1 90, OM2 30-40, mRCA 50-60, Lat/AL akinesis, EF 35-40% >> PCI: Promus DES to OM1;  b. Myoview 3/16: diaph atten, no ischemia, EF 59% (intermediate risk b/c BP drop 152>>137, but recovery BP 162 (tech error) >> med Rx continued  c. cath 05/30/17 s/p FFR guided PCI with DES to circumflex/first OM    COPD exacerbation (HCC) 06/04/2024   COVID-19 virus infection    CREST variant of scleroderma (HCC)    Cystocele    Diverticulitis    Event monitor 04/2019   Event monitor 04/2019: Sinus rhythm; no atrial fibrillation/flutter; no bradycardic events; few PVCs and occasional ventricular runs up to 9 beats   GERD (gastroesophageal reflux disease)    History of blood transfusion    hx of being Anti-K positive   History of Doppler ultrasound    a. Carotid US  2/12: bilateral ICA < 50%   Hyperlipidemia    Hypertension    Ischemic cardiomyopathy    a. EF 35-40% by LHC at time of MI;  b. Echo 7/14:  Mild focal basal septal hypertrophy, EF 50-55%, apical HK, grade 1 diastolic dysfunction, trivial AI    Ischemic colitis (HCC)    Orthostatic hypotension    OSA on CPAP    Osteoporosis 12/2017   T score -2.6   Raynaud disease    Rectocele    mild   Past  Surgical History:  Procedure Laterality Date   ABDOMINAL HYSTERECTOMY  1996   RSO for menorrhagia.   COLONOSCOPY N/A 06/16/2024   Procedure: COLONOSCOPY;  Surgeon: Shila Gustav GAILS, MD;  Location: [facility] ENDOSCOPY;  Service: Gastroenterology;  Laterality: N/A;   CORONARY ANGIOPLASTY WITH STENT PLACEMENT  06/21/2013   CORONARY PRESSURE/FFR STUDY N/A 05/30/2017   Procedure: Intravascular Pressure Wire/FFR Study;  Surgeon: Wonda Sharper, MD;  Location: [facility] INVASIVE CV LAB;  Service: Cardiovascular;  Laterality: N/A;   CORONARY STENT INTERVENTION N/A 05/30/2017   Procedure: Coronary Stent Intervention;  Surgeon: Wonda Sharper, MD;  Location: [facility] INVASIVE CV LAB;  Service: Cardiovascular;  Laterality: N/A;   ESOPHAGOGASTRODUODENOSCOPY N/A 06/16/2024   Procedure: EGD (ESOPHAGOGASTRODUODENOSCOPY);  Surgeon: Nandigam, Kavitha V, MD;  Location: [facility] ENDOSCOPY;  Service: Gastroenterology;  Laterality: N/A;   FOOT SURGERY  2013   x2   JOINT REPLACEMENT  9/10   rt total   LEFT HEART CATH AND CORONARY ANGIOGRAPHY N/A 05/30/2017   Procedure: Left Heart Cath and Coronary Angiography;  Surgeon: Wonda Sharper, MD;  Location[facility] INVASIVE CV LAB;  Service: Cardiovascular;  Laterality: N/A;   LEFT HEART CATH AND CORONARY ANGIOGRAPHY N/A 06/07/2024   Procedure:  LEFT HEART CATH AND CORONARY ANGIOGRAPHY;  Surgeon: Mady Bruckner, MD;  Location: [facility] INVASIVE CV LAB;  Service: Cardiovascular;  Laterality: N/A;   nissen fundoplication  2006   ROTATOR CUFF REPAIR     TONSILLECTOMY  1950   TOTAL KNEE ARTHROPLASTY     right   TUBAL LIGATION  1980    Prior to Admission medications   Medication Sig Start Date End Date Taking? Authorizing Provider  acetaminophen  (TYLENOL ) 500 MG tablet Take 1,000 mg by mouth every 6 (six) hours as needed for headache or moderate pain (pain score 4-6).    [provider]  albuterol  (VENTOLIN  HFA) 108 (90 Base) MCG/ACT inhaler Inhale 1 puff into the lungs every 4 (four) hours as  needed for wheezing or shortness of breath. 03/14/22   [provider]  aspirin  EC 81 MG EC tablet Take 1 tablet (81 mg total) by mouth daily. Patient taking differently: Take 81 mg by mouth in the morning. 06/24/13   Barrett, Shona MATSU, PA-C  b complex vitamins capsule Take 1 capsule by mouth in the morning.    [provider]  bisacodyl (DULCOLAX) 10 MG suppository Place 10 mg rectally as needed for moderate constipation.    [provider]  famotidine  (PEPCID ) 20 MG tablet Take 40 mg by mouth at bedtime.    [provider]  ferrous sulfate  325 (65 FE) MG tablet Take 1 tablet (325 mg total) by mouth daily with breakfast. 06/11/24   Ezenduka, Nkeiruka J, MD  fluticasone  furoate-vilanterol (BREO ELLIPTA ) 100-25 MCG/ACT AEPB Inhale 1 puff into the lungs daily. Patient taking differently: Inhale 1 puff into the lungs in the morning. 06/12/24   Ezenduka, Nkeiruka J, MD  ipratropium-albuterol  (DUONEB) 0.5-2.5 (3) MG/3ML SOLN Take 3 mLs by nebulization every 6 (six) hours as needed. 06/11/24   Ezenduka, Nkeiruka J, MD  magnesium  hydroxide (MILK OF MAGNESIA) 400 MG/5ML suspension Take 30 mLs by mouth as needed for mild constipation.    [provider]  MAGNESIUM  PO Take 1 tablet by mouth in the morning.    [provider]  metoprolol  succinate (TOPROL  XL) 25 MG 24 hr tablet Take 0.5 tablets (12.5 mg total) by mouth daily. Patient taking differently: Take 12.5 mg by mouth in the morning. 07/30/23   Lelon Hamilton T, PA-C  nitroGLYCERIN  (NITROSTAT ) 0.4 MG SL tablet Place 1 tablet (0.4 mg total) under the tongue every 5 (five) minutes x 3 doses as needed for chest pain. 06/11/23   Lelon Hamilton T, PA-C  Nutritional Supplements (NUTRITIONAL SUPPLEMENT PO) Take 120 mLs by mouth in the morning and at bedtime. [employer]    [provider]  OLIVE LEAF EXTRACT PO Take 1 capsule by mouth in the morning.    [provider]  pantoprazole  (PROTONIX ) 40 MG  tablet Take 1 tablet (40 mg total) by mouth 2 (two) times daily. 06/18/24   Jillian Buttery, MD  pravastatin  (PRAVACHOL ) 40 MG tablet Take 1 tablet (40 mg total) by mouth every evening. 06/11/24   Ezenduka, Nkeiruka J, MD  Quercetin 250 MG TABS Take 250 mg by mouth in the morning. 06/12/24   [provider]  senna-docusate (SENOKOT-S) 8.6-50 MG tablet Take 1 tablet by mouth 2 (two) times daily. 06/11/24   Ezenduka, Nkeiruka J, MD  SODIUM PHOSPHATES RE Place 1 Insert rectally as needed. Enema    [provider]  sucralfate  (CARAFATE ) 1 g tablet Take 1 tablet (1 g total) by mouth 4 (four) times daily -  with meals and at bedtime. 06/18/24   Jillian Buttery, MD    Allergies: Nsaids and Aspirin     Review of Systems  Updated Vital Signs BP (!) 128/56 (BP Location: Right Arm)   Pulse 87   Resp (!) 23   Ht 5' (1.524 m)   Wt 47.5 kg   LMP 11/25/1994   SpO2 97%   BMI 20.45 kg/m   Physical Exam Vitals and nursing note reviewed.  Cardiovascular:     Rate and Rhythm: Regular rhythm.  Chest:     Chest wall: No tenderness.  Abdominal:     Tenderness: There is no abdominal tenderness.  Musculoskeletal:     Cervical back: Neck supple.     Left lower leg: Edema present.     Comments: Moderate edema of left lower extremity compared to right lower extremity.  No color change.  Similar temperature.  Neurological:     Mental Status: She is alert.     (all labs ordered are listed, but only abnormal results are displayed) Labs Reviewed  CBC - Abnormal; Notable for the following components:      Result Value   WBC 16.3 (*)    MCHC 29.9 (*)    RDW 18.9 (*)    All other components within normal limits  BASIC METABOLIC PANEL WITH GFR  HEPARIN  LEVEL (UNFRACTIONATED)  CBC  TROPONIN I (HIGH SENSITIVITY)  TROPONIN I (HIGH SENSITIVITY)    EKG: None  Radiology: CT VENOGRAM ABD/PELVIS/LOWER EXT BILAT Result Date: 07/06/2024 CLINICAL DATA:  Known right pulmonary emboli and left  lower extremity deep venous thrombosis. EXAM: CT VENOGRAM ABDOMEN AND PELVIS AND LOWER EXTREMITY BILATERAL TECHNIQUE: Venographic phase images of the abdomen, pelvis and lower extremities were obtained following the administration of intravenous contrast. Multiplanar reformats and maximum intensity projections were generated. RADIATION DOSE REDUCTION: This exam was performed according to the departmental dose-optimization program which includes automated exposure control, adjustment of the mA and/or kV according to patient size and/or use of iterative reconstruction technique. CONTRAST:  75mL OMNIPAQUE  IOHEXOL  350 MG/ML SOLN COMPARISON:  CT from earlier in the same day. FINDINGS: Hepatobiliary: No focal liver abnormality is seen. No gallstones, gallbladder wall thickening, or biliary dilatation. Pancreas: Unremarkable. No pancreatic ductal dilatation or surrounding inflammatory changes. Spleen: Normal in size without focal abnormality. Adrenals/Urinary Tract: Adrenal glands are within normal limits. Kidneys demonstrate a normal enhancement pattern bilaterally. No renal calculi or obstructive changes are noted. The bladder is decompressed. Stomach/Bowel: Scattered fecal material is noted throughout the colon. Diverticular changes noted without evidence of diverticulitis. No obstructive changes are seen. The appendix is not well visualized and may have been surgically removed. Small bowel and stomach are unremarkable. Vascular/Lymphatic: Aortic atherosclerosis. No enlarged abdominal or pelvic lymph nodes Reproductive: Status post hysterectomy. No adnexal masses. Other: No abdominal wall hernia or abnormality. No abdominopelvic ascites. Musculoskeletal: Degenerative changes of the thoracolumbar spine are noted. Mild scoliosis concave to the left is seen in the upper lumbar spine. No acute bony abnormality is seen. Extensive soft tissue swelling and edema is noted in the left leg. IVC: Within normal limits. Portal and  mesenteric veins: Within normal limits. Bilateral iliac veins: Right iliac vein is widely patent. Left iliac vein demonstrates diffuse deep venous thrombosis involving the common, external and internal iliac venous systems. Right lower extremity: Right lower extremity shows no evidence of deep venous thrombosis. Left lower extremity: Left lower extremity shows extensive DVT extending from at least the confluence of the calf veins through the  popliteal vein, superficial femoral vein and common femoral vein. Some profundus femoral clot is noted as well. Extensive edema is noted in the left lower leg consistent with the deep venous thrombosis. IMPRESSION: Extensive left-sided DVT extending from at least the confluence of the left calf veins to the confluence of the iliac veins. Extensive edema is noted consistent with the thrombus. No involvement of the IVC is noted. Diverticulosis without diverticulitis. Electronically Signed   By: Oneil Devonshire M.D.   On: 07/06/2024 20:01   CT Angio Chest PE W and/or Wo Contrast Result Date: 07/06/2024 CLINICAL DATA:  Known left lower extremity deep venous thrombosis. EXAM: CT ANGIOGRAPHY CHEST WITH CONTRAST TECHNIQUE: Multidetector CT imaging of the chest was performed using the standard protocol during bolus administration of intravenous contrast. Multiplanar CT image reconstructions and MIPs were obtained to evaluate the vascular anatomy. RADIATION DOSE REDUCTION: This exam was performed according to the departmental dose-optimization program which includes automated exposure control, adjustment of the mA and/or kV according to patient size and/or use of iterative reconstruction technique. CONTRAST:  75mL OMNIPAQUE  IOHEXOL  350 MG/ML SOLN COMPARISON:  None Available. FINDINGS: Cardiovascular: Atherosclerotic calcifications of the thoracic aorta are noted. Aberrant right subclavian artery is noted. No aneurysmal dilatation or dissection is seen. The heart is within normal limits  in size. The pulmonary artery shows segmental right lower lobe pulmonary emboli. No other significant emboli are seen. Coronary calcifications are noted. Mediastinum/Nodes: Thoracic inlet is within normal limits. No hilar or mediastinal adenopathy is noted. The esophagus shows severe dilatation with considerable ingested material within. Large hiatal hernia is noted as well. These findings are stable in appearance from the prior study. Lungs/Pleura: Moderate to large right-sided pleural effusion is noted with small left-sided pleural effusion. Compressive atelectasis in the lower lobes is noted bilaterally worse on the right than the left. Some linear atelectasis is noted in the superior segment of the right lower lobe. Upper Abdomen: No acute abnormality. Musculoskeletal: No chest wall abnormality. No acute or significant osseous findings. Review of the MIP images confirms the above findings. IMPRESSION: Right lower lobe segmental pulmonary embolus without evidence of right heart strain. Significantly dilated esophagus stable in appearance from the prior exam. Bilateral pleural effusions right considerably greater than left with compressive atelectasis. Aortic Atherosclerosis (ICD10-I70.0). Electronically Signed   By: Oneil Devonshire M.D.   On: 07/06/2024 19:54     Procedures   Medications Ordered in the ED  heparin  ADULT infusion 100 units/mL (25000 units/250mL) (800 Units/hr Intravenous Restarted 07/06/24 1945)  heparin  bolus via infusion 2,500 Units (2,500 Units Intravenous Bolus from Bag 07/06/24 1810)  iohexol  (OMNIPAQUE ) 350 MG/ML injection 75 mL (75 mLs Intravenous Contrast Given 07/06/24 1924)                                    Medical Decision Making Amount and/or Complexity of Data Reviewed Labs: ordered. Radiology: ordered.  Risk Prescription drug management. Decision regarding hospitalization.   Patient with potential DVT of left lower extremity.  Outpatient Doppler report  reviewed.  Discussed with Dr. Sheree.  Recommends getting CT venogram.  Will also now get CTA due to the extent of the clot.  Will start heparin  since May need reversibility and short acting due to recent GI bleed.  CT scan does show extensive clot on left lower extremity.  It does come up to iliac but not IVC.  Also does have small pulmonary  embolism.  Currently on heparin  drip.  Will admit to hospitalist.   CRITICAL CARE Performed by: [place] Total critical care time: 30 minutes Critical care time was exclusive of separately billable procedures and treating other patients. Critical care was necessary to treat or prevent imminent or life-threatening deterioration. Critical care was time spent personally by me on the following activities: development of treatment plan with patient and/or surrogate as well as nursing, discussions with consultants, evaluation of patient's response to treatment, examination of patient, obtaining history from patient or surrogate, ordering and performing treatments and interventions, ordering and review of laboratory studies, ordering and review of radiographic studies, pulse oximetry and re-evaluation of patient's condition.      Final diagnoses:  Acute deep vein thrombosis (DVT) of proximal vein of left lower extremity [facility])    ED Discharge Orders     None          River Rankin, MD 07/06/24 2031

## 2024-07-06 NOTE — ED Notes (Signed)
 Patient transported to CT

## 2024-07-07 ENCOUNTER — Ambulatory Visit: Admitting: Student

## 2024-07-07 ENCOUNTER — Encounter (HOSPITAL_COMMUNITY)
Admission: EM | Disposition: A | Discharge status: Skilled Nursing Facility | Payer: Self-pay | Source: Ambulatory Visit | Attending: Internal Medicine

## 2024-07-07 DIAGNOSIS — I82432 Acute embolism and thrombosis of left popliteal vein: Secondary | ICD-10-CM

## 2024-07-07 DIAGNOSIS — I2693 Single subsegmental pulmonary embolism without acute cor pulmonale: Secondary | ICD-10-CM | POA: Diagnosis not present

## 2024-07-07 DIAGNOSIS — I82412 Acute embolism and thrombosis of left femoral vein: Secondary | ICD-10-CM

## 2024-07-07 DIAGNOSIS — I82422 Acute embolism and thrombosis of left iliac vein: Principal | ICD-10-CM

## 2024-07-07 HISTORY — PX: LOWER EXTREMITY ANGIOGRAPHY: CATH118251

## 2024-07-07 HISTORY — PX: PERIPHERAL VASCULAR THROMBECTOMY: CATH118306

## 2024-07-07 HISTORY — PX: VENOUS ANGIOPLASTY: CATH118376

## 2024-07-07 LAB — COMPREHENSIVE METABOLIC PANEL WITH GFR
ALT: 11 U/L (ref 0–44)
AST: 16 U/L (ref 15–41)
Albumin: 2.4 g/dL — ABNORMAL LOW (ref 3.5–5.0)
Alkaline Phosphatase: 79 U/L (ref 38–126)
Anion gap: 10 (ref 5–15)
BUN: 10 mg/dL (ref 8–23)
CO2: 24 mmol/L (ref 22–32)
Calcium: 8.8 mg/dL — ABNORMAL LOW (ref 8.9–10.3)
Chloride: 106 mmol/L (ref 98–111)
Creatinine, Ser: 0.65 mg/dL (ref 0.44–1.00)
GFR, Estimated: >60 mL/min (ref >60)
Glucose, Bld: 103 mg/dL — ABNORMAL HIGH (ref 70–99)
Potassium: 4.3 mmol/L (ref 3.5–5.1)
Sodium: 140 mmol/L (ref 135–145)
Total Bilirubin: 0.6 mg/dL (ref 0.0–1.2)
Total Protein: 5.1 g/dL — ABNORMAL LOW (ref 6.5–8.1)

## 2024-07-07 LAB — CBC
HCT: 35.9 % — ABNORMAL LOW (ref 36.0–46.0)
Hemoglobin: 10.9 g/dL — ABNORMAL LOW (ref 12.0–15.0)
MCH: 27.1 pg (ref 26.0–34.0)
MCHC: 30.4 g/dL (ref 30.0–36.0)
MCV: 89.3 fL (ref 80.0–100.0)
Platelets: 299 K/uL (ref 150–400)
RBC: 4.02 MIL/uL (ref 3.87–5.11)
RDW: 18.7 % — ABNORMAL HIGH (ref 11.5–15.5)
WBC: 11.1 K/uL — ABNORMAL HIGH (ref 4.0–10.5)
nRBC: 0 % (ref 0.0–0.2)

## 2024-07-07 LAB — HEPARIN LEVEL (UNFRACTIONATED)
Heparin Unfractionated: 0.4 [IU]/mL (ref 0.30–0.70)
Heparin Unfractionated: 0.36 [IU]/mL (ref 0.30–0.70)

## 2024-07-07 SURGERY — PERIPHERAL VASCULAR THROMBECTOMY
Anesthesia: LOCAL

## 2024-07-07 MED ORDER — FERROUS SULFATE 325 (65 FE) MG PO TABS
325.0000 mg | ORAL_TABLET | Freq: Every day | ORAL | Status: DC
  Administered 2024-07-08 – 2024-07-12 (×5): 325 mg via ORAL
  Filled 2024-07-07 (×5): qty 1

## 2024-07-07 MED ORDER — FENTANYL CITRATE (PF) 100 MCG/2ML IJ SOLN
INTRAMUSCULAR | Status: DC | PRN
  Administered 2024-07-07 (×2): 25 ug via INTRAVENOUS

## 2024-07-07 MED ORDER — PRAVASTATIN SODIUM 40 MG PO TABS
40.0000 mg | ORAL_TABLET | Freq: Every evening | ORAL | Status: DC
  Administered 2024-07-07 – 2024-07-11 (×6): 40 mg via ORAL
  Filled 2024-07-07 (×5): qty 1

## 2024-07-07 MED ORDER — MIDAZOLAM HCL 2 MG/2ML IJ SOLN
INTRAMUSCULAR | Status: AC
  Filled 2024-07-07: qty 2

## 2024-07-07 MED ORDER — NITROGLYCERIN 0.4 MG SL SUBL: 0.4000 mg | SUBLINGUAL_TABLET | SUBLINGUAL | Status: DC | PRN

## 2024-07-07 MED ORDER — ASPIRIN 81 MG PO TBEC
81.0000 mg | DELAYED_RELEASE_TABLET | Freq: Every day | ORAL | Status: DC
  Administered 2024-07-08 – 2024-07-12 (×5): 81 mg via ORAL
  Filled 2024-07-07 (×5): qty 1

## 2024-07-07 MED ORDER — FAMOTIDINE 20 MG PO TABS
40.0000 mg | ORAL_TABLET | Freq: Every day | ORAL | Status: DC
  Administered 2024-07-07 – 2024-07-11 (×6): 40 mg via ORAL
  Filled 2024-07-07 (×5): qty 2

## 2024-07-07 MED ORDER — LIDOCAINE HCL (PF) 1 % IJ SOLN
INTRAMUSCULAR | Status: AC
  Filled 2024-07-07: qty 30

## 2024-07-07 MED ORDER — HEPARIN SODIUM (PORCINE) 1000 UNIT/ML IJ SOLN
INTRAMUSCULAR | Status: AC
  Filled 2024-07-07: qty 10

## 2024-07-07 MED ORDER — MIDAZOLAM HCL 2 MG/2ML IJ SOLN
INTRAMUSCULAR | Status: DC | PRN
  Administered 2024-07-07 (×2): 1 mg via INTRAVENOUS

## 2024-07-07 MED ORDER — ALBUTEROL SULFATE HFA 108 (90 BASE) MCG/ACT IN AERS: 1.0000 | INHALATION_SPRAY | RESPIRATORY_TRACT | Status: DC | PRN

## 2024-07-07 MED ORDER — MORPHINE SULFATE (PF) 2 MG/ML IV SOLN
2.0000 mg | INTRAVENOUS | Status: DC | PRN
  Administered 2024-07-07 – 2024-07-11 (×6): 2 mg via INTRAVENOUS
  Filled 2024-07-07 (×5): qty 1

## 2024-07-07 MED ORDER — FLUTICASONE FUROATE-VILANTEROL 100-25 MCG/ACT IN AEPB: 1.0000 | INHALATION_SPRAY | Freq: Every morning | RESPIRATORY_TRACT | Status: DC

## 2024-07-07 MED ORDER — HEPARIN (PORCINE) IN NACL 1000-0.9 UT/500ML-% IV SOLN
INTRAVENOUS | Status: DC | PRN
  Administered 2024-07-07 (×2): 500 mL

## 2024-07-07 MED ORDER — PANTOPRAZOLE SODIUM 40 MG PO TBEC
40.0000 mg | DELAYED_RELEASE_TABLET | Freq: Two times a day (BID) | ORAL | Status: DC
  Administered 2024-07-07 – 2024-07-12 (×11): 40 mg via ORAL
  Filled 2024-07-07 (×10): qty 1

## 2024-07-07 MED ORDER — FLUTICASONE FUROATE-VILANTEROL 100-25 MCG/ACT IN AEPB
1.0000 | INHALATION_SPRAY | Freq: Every morning | RESPIRATORY_TRACT | Status: DC
  Administered 2024-07-08 – 2024-07-12 (×5): 1 via RESPIRATORY_TRACT
  Filled 2024-07-07: qty 28

## 2024-07-07 MED ORDER — LIDOCAINE HCL (PF) 1 % IJ SOLN
INTRAMUSCULAR | Status: DC | PRN
  Administered 2024-07-07 (×2): 8 mL via INTRADERMAL

## 2024-07-07 MED ORDER — IODIXANOL 320 MG/ML IV SOLN
INTRAVENOUS | Status: DC | PRN
  Administered 2024-07-07 (×2): 25 mL via INTRAVENOUS

## 2024-07-07 MED ORDER — ALBUTEROL SULFATE (2.5 MG/3ML) 0.083% IN NEBU: 2.5000 mg | INHALATION_SOLUTION | RESPIRATORY_TRACT | Status: DC | PRN

## 2024-07-07 MED ORDER — METOPROLOL SUCCINATE ER 25 MG PO TB24
12.5000 mg | ORAL_TABLET | Freq: Every morning | ORAL | Status: DC
  Administered 2024-07-08 – 2024-07-12 (×5): 12.5 mg via ORAL
  Filled 2024-07-07 (×5): qty 1

## 2024-07-07 MED ORDER — ONDANSETRON HCL 4 MG PO TABS: 4.0000 mg | ORAL_TABLET | Freq: Four times a day (QID) | ORAL | Status: DC | PRN

## 2024-07-07 MED ORDER — FENTANYL CITRATE (PF) 100 MCG/2ML IJ SOLN
INTRAMUSCULAR | Status: AC
  Filled 2024-07-07: qty 2

## 2024-07-07 MED ORDER — ONDANSETRON HCL 4 MG/2ML IJ SOLN
4.0000 mg | Freq: Four times a day (QID) | INTRAMUSCULAR | Status: AC | PRN
Start: 2024-07-07 — End: ?

## 2024-07-07 MED ORDER — HEPARIN SODIUM (PORCINE) 1000 UNIT/ML IJ SOLN
INTRAMUSCULAR | Status: DC | PRN
  Administered 2024-07-07 (×2): 5000 [IU] via INTRAVENOUS

## 2024-07-07 SURGICAL SUPPLY — 11 items
BALLOON MUSTANG 10.0X40 75 (BALLOONS) IMPLANT
CANISTER PENUMBRA ENGINE (MISCELLANEOUS) IMPLANT
CATH LIGHTNI FLASH 16XTORQ 100 (CATHETERS) IMPLANT
CATH VISIONS PV .035 IVUS (CATHETERS) IMPLANT
GLIDEWIRE ADV .035X260CM (WIRE) IMPLANT
KIT ENCORE 26 ADVANTAGE (KITS) IMPLANT
KIT MICROPUNCTURE NIT STIFF (SHEATH) IMPLANT
SHEATH DRYSEAL FLEX 16FR 33CM (SHEATH) IMPLANT
SHEATH PINNACLE 8F 10CM (SHEATH) IMPLANT
SHEATH PROBE COVER 6X72 (BAG) IMPLANT
TRAY PV CATH (CUSTOM PROCEDURE TRAY) ×3 IMPLANT

## 2024-07-07 NOTE — ED Notes (Signed)
 Made 2 Unsuccessful attempts to straight stick for heparin  blood draw. Contacted Phlebotomy to assist.

## 2024-07-07 NOTE — Progress Notes (Signed)
 PHARMACY - ANTICOAGULATION CONSULT NOTE  Pharmacy Consult for heparin  Indication: DVT  Allergies  Allergen Reactions   Nsaids Other (See Comments)    Prone to bleeding   Aspirin  Other (See Comments)    Prone to bleeding OK to take low-dose EC but not aspirin  based medications.    Patient Measurements: Height: 5' (152.4 cm) Weight: 47.5 kg (104 lb 11.5 oz) IBW/kg (Calculated) : 45.5 HEPARIN  DW (KG): 47.5  Vital Signs: Temp: 98 F (36.7 C) (08/13 1527) Temp Source: Oral (08/13 1527) BP: 120/70 (08/13 1527) Pulse Rate: 108 (08/13 1234)  Labs: Recent Labs    07/06/24 1754 07/06/24 1940 07/07/24 0241 07/07/24 1000  HGB 12.0  --  10.9*  --   HCT 40.1  --  35.9*  --   PLT 322  --  299  --   HEPARINUNFRC  --   --  0.40 0.36  CREATININE 0.72  --  0.65  --   TROPONINIHS 15 16  --   --     Estimated Creatinine Clearance: 39.6 mL/min (by C-G formula based on SCr of 0.65 mg/dL).  Assessment: 61 YOF presenting from clinic with positive DVT, she is not on anticoagulation PTA, CT angio chest pending for PE rule out as well Heparin  confirmatory level in range.  S/p thrombectomy 8/13 with minimal residual thrombus. Plan to continue heparin  per vascular note. Pt received 5000 units heparin  in cath lab ~1430.  Goal of Therapy:  Heparin  level 0.3-0.7 units/ml Monitor platelets by anticoagulation protocol: Yes   Plan:  Continue heparin  gtt at 800 units/hr Daily heparin  level and CBC  Vito Ralph, PharmD, BCPS Please see amion for complete clinical pharmacist phone list 07/07/2024  7:31 PM

## 2024-07-07 NOTE — Progress Notes (Addendum)
 PHARMACY - ANTICOAGULATION CONSULT NOTE  Pharmacy Consult for heparin  Indication: DVT  Allergies  Allergen Reactions   Nsaids Other (See Comments)    Prone to bleeding   Aspirin  Other (See Comments)    Prone to bleeding OK to take low-dose EC but not aspirin  based medications.    Patient Measurements: Height: 5' (152.4 cm) Weight: 47.5 kg (104 lb 11.5 oz) IBW/kg (Calculated) : 45.5 HEPARIN  DW (KG): 47.5  Vital Signs: Temp: 98.2 F (36.8 C) (08/12 2141) Temp Source: Oral (08/12 2141) BP: 114/60 (08/13 0230) Pulse Rate: 81 (08/13 0230)  Labs: Recent Labs    07/06/24 1754 07/06/24 1940 07/07/24 0241  HGB 12.0  --  10.9*  HCT 40.1  --  35.9*  PLT 322  --  299  HEPARINUNFRC  --   --  0.40  CREATININE 0.72  --   --   TROPONINIHS 15 16  --     Estimated Creatinine Clearance: 39.6 mL/min (by C-G formula based on SCr of 0.72 mg/dL).  Assessment: 72 YOF presenting from clinic with positive DVT, she is not on anticoagulation PTA, CT angio chest pending for PE rule out as well  AM: heparin  level at goal on 800 units/hr. No signs/symptoms of bleeding noted. CTa: positive for PE w/o RHS  Goal of Therapy:  Heparin  level 0.3-0.7 units/ml Monitor platelets by anticoagulation protocol: Yes   Plan:  heparin  gtt at 800 units/hr F/u 8 hour confirmatory heparin  level F/u long term AC plan after thrombectomy   Lynwood Poplar, PharmD, BCPS Clinical Pharmacist 07/07/2024 3:10 AM

## 2024-07-07 NOTE — Progress Notes (Signed)
   07/07/24 1528  First Vascular Site Assessment  #1 - Vascular Site Assessment Scale Level 0  #1 - Dressing Status Clean, Dry, Intact  RLE Neurovascular Assessment  RLE Capillary Refill  Greater than 3 seconds  RLE Color  Purple  RLE Temperature/Moisture  Cold  LLE Neurovascular Assessment  LLE Capillary Refill  Greater than 3 seconds  LLE Color  Purple  LLE Temperature/Moisture  Cold   Notified Dr. Lanis. Per MD, patient has known cyanosis, and this is normal and expected for her.

## 2024-07-07 NOTE — Hospital Course (Addendum)
 crest syndrome, GERD, coronary artery disease, Barrett's esophagus, anemia of chronic disease, essential hypertension, hyperlipidemia, ischemic cardiomyopathy, coronary artery disease, obstructive sleep apnea, osteoporosis, Raynaud's disease, diverticular disease, COPD and asthma who was recently admitted on July 28 with suspected GI bleed. She lives in [place]. She had diarrhea weakness and low blood pressure at the time. Patient also had NSTEMI back in July 10. Patient brought in today with swelling of the left lower extremity. It was markedly swollen warm and tender. She went to the pain clinic today reports that the swelling has been going on for about 5 days. Her PCP sent her there first. Doppler ultrasound was performed that showed extensive DVT of the left lower extremity so she was sent to the ER for further evaluation and treatment. In the ED further evaluation confirmed the DVT with CT venogram done showed extensive left-sided DVT from at least the confluence of the left calf veins to the compress of the iliac veins. Subsequent CT angiogram of the chest showed a small right lower lobe subsegmental pulmonary embolus. No right heart strain. Vascular surgery was consulted with plan for thrombectomy  and admitted  Subjective: Seen and examined today She is resting comfortably no complaints She reports she slept well last night Overnight afebrile BP stable on room air Labs reviewed fairly stable CBC with improved leukocytosis, CMP stable.   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 Npo for procedure today  GERD: Continue PPI.  Hypertension CAD-S/P coronary artery stent placement Hyperlipidemia: No chest pain.  BP remains controlled.   Continue metoprolol , pravastatin , aspirin , heparin  as above  Iron  deficiency anemia history: Hemoglobin stable continue iron  supplement.  COPD: Continue her Breo, bronchodilators  OSA: con CPAP  Cr(e)st syndrome Stable.   Mobility: Currently bedrest PT Orders:  PT Follow up Rec:    DVT prophylaxis:  Code Status:   Code Status: Full Code Family Communication: plan of care discussed with patient at bedside. Patient status is: Remains hospitalized because of severity of illness Level of care: Telemetry Cardiac   Dispo: The patient is from: home with daughter            Anticipated disposition: TBD-pending clearance with vascular Objective: Vitals last 24 hrs: Vitals:   07/07/24 0500 07/07/24 0515 07/07/24 0530 07/07/24 0544  BP: (!) 103/53 106/60 (!) 111/50   Pulse: 73 81 72   Resp: 16 16 17    Temp:    98 F (36.7 C)  TempSrc:    Oral  SpO2: 96% 98% 100%   Weight:      Height:        Physical Examination: General exam: alert awake, oriented, older than stated age HEENT:Oral mucosa moist, Ear/Nose WNL grossly Respiratory system: Bilaterally clear BS,no use of accessory muscle Cardiovascular system: S1 & S2 +, No JVD. Gastrointestinal system: Abdomen soft,NT,ND, BS+ Nervous System: Alert, awake, moving all extremities,and following commands. Extremities: LE edema present extensively on the left lower extremity-pictured below Skin: No rashes,no icterus. MSK: Normal muscle bulk,tone, power   Medications reviewed:  Scheduled Meds:  famotidine   40 mg Oral QHS   ferrous sulfate   325 mg Oral Q breakfast   metoprolol  succinate  12.5 mg Oral q AM   pantoprazole   40 mg Oral BID   pravastatin   40 mg Oral  QPM   Continuous Infusions:  heparin  800 Units/hr (07/07/24 0537)   Diet: Diet Order             Diet NPO time specified  Diet effective midnight

## 2024-07-07 NOTE — ED Notes (Signed)
 Patient is lying on stretcher on her phone. Vitals WNL. Denies any concerns or needs at this time. Awaiting Inpatient Bed assignment.

## 2024-07-07 NOTE — Progress Notes (Signed)
 PHARMACY - ANTICOAGULATION CONSULT NOTE  Pharmacy Consult for heparin  Indication: DVT  Allergies  Allergen Reactions   Nsaids Other (See Comments)    Prone to bleeding   Aspirin  Other (See Comments)    Prone to bleeding OK to take low-dose EC but not aspirin  based medications.    Patient Measurements: Height: 5' (152.4 cm) Weight: 47.5 kg (104 lb 11.5 oz) IBW/kg (Calculated) : 45.5 HEPARIN  DW (KG): 47.5  Vital Signs: Temp: 98 F (36.7 C) (08/13 0544) Temp Source: Oral (08/13 0544) BP: 126/58 (08/13 1130) Pulse Rate: 68 (08/13 0900)  Labs: Recent Labs    07/06/24 1754 07/06/24 1940 07/07/24 0241 07/07/24 1000  HGB 12.0  --  10.9*  --   HCT 40.1  --  35.9*  --   PLT 322  --  299  --   HEPARINUNFRC  --   --  0.40 0.36  CREATININE 0.72  --  0.65  --   TROPONINIHS 15 16  --   --     Estimated Creatinine Clearance: 39.6 mL/min (by C-G formula based on SCr of 0.65 mg/dL).  Assessment: 85 YOF presenting from clinic with positive DVT, she is not on anticoagulation PTA, CT angio chest pending for PE rule out as well  Heparin  confirmatory level in range.  Goal of Therapy:  Heparin  level 0.3-0.7 units/ml Monitor platelets by anticoagulation protocol: Yes   Plan:  heparin  gtt at 800 units/hr Plan for thrombectomy today F/u long term [facility] plan after thrombectomy   Larraine Brazier, PharmD Clinical Pharmacist 07/07/2024  12:20 PM **Pharmacist phone directory can now be found on [email or web address] ([email or web address]).  Listed under [facility].

## 2024-07-07 NOTE — Progress Notes (Signed)
  Progress Note    07/07/2024 8:12 AM * No surgery found *  Subjective: Sleeping  Vitals:   07/07/24 0700 07/07/24 0800  BP: (!) 141/55 (!) 117/57  Pulse: 85 78  Resp: 18 17  Temp:    SpO2: 99% 99%    Physical Exam: She is sleepy this morning Left lower extremity with profound edema although somewhat improved and compartments are soft and left foot is warm and well-perfused  CBC    Component Value Date/Time   WBC 11.1 (H) 07/07/2024 0241   RBC 4.02 07/07/2024 0241   HGB 10.9 (L) 07/07/2024 0241   HGB 11.1 10/13/2023 0951   HCT 35.9 (L) 07/07/2024 0241   HCT 38.2 10/13/2023 0951   PLT 299 07/07/2024 0241   PLT 207 10/13/2023 0951   MCV 89.3 07/07/2024 0241   MCV 80 10/13/2023 0951   MCH 27.1 07/07/2024 0241   MCHC 30.4 07/07/2024 0241   RDW 18.7 (H) 07/07/2024 0241   RDW 17.1 (H) 10/13/2023 0951   LYMPHSABS 1.4 06/13/2024 0932   LYMPHSABS 2.1 03/23/2019 0000   MONOABS 2.7 (H) 06/13/2024 0932   EOSABS 0.1 06/13/2024 0932   EOSABS 0.2 03/23/2019 0000   BASOSABS 0.1 06/13/2024 0932   BASOSABS 0.1 03/23/2019 0000    BMET    Component Value Date/Time   NA 140 07/07/2024 0241   NA 139 10/13/2023 0951   K 4.3 07/07/2024 0241   CL 106 07/07/2024 0241   CO2 24 07/07/2024 0241   GLUCOSE 103 (H) 07/07/2024 0241   BUN 10 07/07/2024 0241   BUN 16 10/13/2023 0951   CREATININE 0.65 07/07/2024 0241   CREATININE 0.75 05/24/2021 0000   CALCIUM  8.8 (L) 07/07/2024 0241   GFRNONAA >60 07/07/2024 0241   GFRNONAA 76 05/24/2021 0000   GFRAA 88 05/24/2021 0000    INR    Component Value Date/Time   INR 0.9 05/26/2017 1523    No intake or output data in the 24 hours ending 07/07/24 9187   Assessment:  83 y.o. female is here with extensive left lower extremity DVT with what appears to be May Thurner configuration.  Plan: Okay for breakfast and n.p.o. after Plan for left lower extremity thrombectomy of extensive DVT today possible stenting for May-Thurner syndrome.   I discussed the risk and benefits with the patient daughter via telephone yesterday and they demonstrate understanding.  Kealani Leckey C. Sheree, MD [facility] Office: [phone] Pager: [phone]  07/07/2024 8:12 AM

## 2024-07-07 NOTE — Op Note (Signed)
    Patient name: Kristine [name] MRN: [id number] DOB: 24-Mar-1941 Sex: female  07/07/2024 Pre-operative Diagnosis: Extensive left lower extremity DVT extending into the left common iliac vein Post-operative diagnosis:  Same Surgeon:  Fonda FORBES Rim, MD Procedure Performed: 1.  Ultrasound-guided micropuncture access of the left popliteal vein in antegrade fashion 2.  Intravascular ultrasound of the popliteal vein, femoral vein, common femoral vein, external iliac vein, common iliac vein, inferior vena cava 3.  Percutaneous mechanical thrombectomy using 16 French lightening penumbra system popliteal vein, femoral vein, common femoral vein, external iliac vein, common iliac vein 4.  Diagnostic angiography of the left venous system extending from the popliteal vein through the inferior vena cava 5.  Balloon venoplasty 10 x 40 mm external iliac vein, common iliac vein 6.  Moderate sedation time 34 minutes, contrast volume 25 cc    Indications: Patient is an 83 year old female with recent history of deep venous thrombosis due to inactivity in her rehab facility.  Imaging demonstrated thrombus into the left common iliac vein.  There was question of May-Thurner.  After discussing the risks and benefits of percutaneous mechanical thrombectomy plus or minus stenting, [name] elected to proceed.  Findings:   Acute thrombus extending from the popliteal vein into the left common iliac vein. Compression of the common iliac vein from the right common iliac artery-May-Thurner syndrome   Procedure:  The patient was identified in the holding area and taken to room 8.  The patient was then placed prone on the table and prepped and draped in the usual sterile fashion.  A time out was called.  Ultrasound was used to evaluate the left popliteal vein.  This was accessed using an ultrasound-guided micropuncture needle.  Access was then dilated to 8 [place].  Next, to keep contrast down, I used intravascular ultrasound to  assess the left sided venous system for thrombus.  See results above.  I elected to intervene.  The patient was heparinized and a 16 [place] Gore dry seal sheath was placed in the left popliteal vein.  From this location, 74 French lightening penumbra system was utilized and thrombus removed from the popliteal vein, femoral vein, common femoral vein, external iliac vein, common iliac vein. Follow-up angiography demonstrated an excellent result.  There was minimal residual thrombus.  I elected to use balloon venoplasty and shows a 10 mm x 40 mm balloon after sizing the common iliac vein.  Follow-up angiography demonstrated recanalization of the venous system.  Repeat intravascular ultrasound demonstrated compression of the common iliac vein, however I chose not to stent as venous stents have a high propensity to occlude, and that her DVT was provoked.  Should she have another event, I would move forward with stenting.  Plan for continued anticoagulation.  The venotomy was managed with Monocryl suture.    Fonda FORBES Rim MD [facility] Office: [phone]

## 2024-07-07 NOTE — ED Notes (Signed)
 RN assisted pt on BSC.   Pt had a small BM and urine output

## 2024-07-07 NOTE — Progress Notes (Signed)
 PROGRESS NOTE Kristine [name]  FMW:[id number] DOB: 01-01-1941 DOA: 07/06/2024 PCP: [name], MD  Brief Narrative/Hospital Course: crest syndrome, GERD, coronary artery disease, Barrett's esophagus, anemia of chronic disease, essential hypertension, hyperlipidemia, ischemic cardiomyopathy, coronary artery disease, obstructive sleep apnea, osteoporosis, Raynaud's disease, diverticular disease, COPD and asthma who was recently admitted on July 28 with suspected GI bleed. She lives in [place]. She had diarrhea weakness and low blood pressure at the time. Patient also had NSTEMI back in July 10. Patient brought in today with swelling of the left lower extremity. It was markedly swollen warm and tender. She went to the pain clinic today reports that the swelling has been going on for about 5 days. Her PCP sent her there first. Doppler ultrasound was performed that showed extensive DVT of the left lower extremity so she was sent to the ER for further evaluation and treatment. In the ED further evaluation confirmed the DVT with CT venogram done showed extensive left-sided DVT from at least the confluence of the left calf veins to the compress of the iliac veins. Subsequent CT angiogram of the chest showed a small right lower lobe subsegmental pulmonary embolus. No right heart strain. Vascular surgery was consulted with plan for thrombectomy  and admitted  Subjective: Seen and examined today She is resting comfortably no complaints She reports she slept well last night Overnight afebrile BP stable on room air Labs reviewed fairly stable CBC with improved leukocytosis, CMP stable.   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 Npo for procedure  today  GERD: Continue PPI.  Hypertension CAD-S/P coronary artery stent placement Hyperlipidemia: No chest pain.  BP remains controlled.  Continue metoprolol , pravastatin , aspirin , heparin  as above  Iron  deficiency anemia history: Hemoglobin stable continue iron  supplement.  COPD: Continue her Breo, bronchodilators  OSA: con CPAP  Cr(e)st syndrome Stable.   Mobility: Currently bedrest PT Orders:  PT Follow up Rec:    DVT prophylaxis:  Code Status:   Code Status: Full Code Family Communication: plan of care discussed with patient at bedside. Patient status is: Remains hospitalized because of severity of illness Level of care: Telemetry Cardiac   Dispo: The patient is from: home with daughter            Anticipated disposition: TBD-pending clearance with vascular Objective: Vitals last 24 hrs: Vitals:   07/07/24 0500 07/07/24 0515 07/07/24 0530 07/07/24 0544  BP: (!) 103/53 106/60 (!) 111/50   Pulse: 73 81 72   Resp: 16 16 17    Temp:    98 F (36.7 C)  TempSrc:    Oral  SpO2: 96% 98% 100%   Weight:      Height:        Physical Examination: General exam: alert awake, oriented, older than stated age HEENT:Oral mucosa moist, Ear/Nose WNL grossly Respiratory system: Bilaterally clear BS,no use of accessory muscle Cardiovascular system: S1 & S2 +, No JVD. Gastrointestinal system: Abdomen soft,NT,ND, BS+ Nervous System: Alert, awake, moving all extremities,and following commands. Extremities: LE edema present extensively on the left lower extremity-pictured below Skin: No rashes,no icterus. MSK: Normal muscle bulk,tone, power   Medications reviewed:  Scheduled Meds:  famotidine   40 mg Oral QHS   ferrous sulfate   325 mg Oral Q breakfast   metoprolol  succinate  12.5 mg  Oral q AM   pantoprazole   40 mg Oral BID   pravastatin   40 mg Oral QPM   Continuous Infusions:  heparin  800 Units/hr (07/07/24 0537)   Diet: Diet Order             Diet NPO time specified   Diet effective midnight                    Data Reviewed: I have personally reviewed following labs and imaging studies ( see epic result tab) CBC: Recent Labs  Lab 07/06/24 1754 07/07/24 0241  WBC 16.3* 11.1*  HGB 12.0 10.9*  HCT 40.1 35.9*  MCV 90.5 89.3  PLT 322 299   CMP: Recent Labs  Lab 07/06/24 1754 07/07/24 0241  NA 138 140  K 4.4 4.3  CL 103 106  CO2 24 24  GLUCOSE 94 103*  BUN 10 10  CREATININE 0.72 0.65  CALCIUM  9.2 8.8*   GFR: Estimated Creatinine Clearance: 39.6 mL/min (by C-G formula based on SCr of 0.65 mg/dL). Recent Labs  Lab 07/07/24 0241  AST 16  ALT 11  ALKPHOS 79  BILITOT 0.6  PROT 5.1*  ALBUMIN 2.4*   No results for input(s): LIPASE, AMYLASE in the last 168 hours. No results for input(s): AMMONIA in the last 168 hours. Coagulation Profile: No results for input(s): INR, PROTIME in the last 168 hours. Unresulted Labs (From admission, onward)     Start     Ordered   07/08/24 0500  Heparin  level (unfractionated)  Daily,   R     Placed in And Linked Group   07/06/24 1740   07/08/24 0500  Basic metabolic panel with GFR  Tomorrow morning,   R        07/07/24 1101   07/07/24 1000  Heparin  level (unfractionated)  Once-Timed,   TIMED        07/07/24 0311   07/07/24 0500  CBC  Daily,   R     Placed in And Linked Group   07/06/24 1740           Antimicrobials/Microbiology: Anti-infectives (From admission, onward)    None         Component Value Date/Time   SDES URINE, CLEAN CATCH 06/13/2024 1329   SPECREQUEST NONE 06/13/2024 1329   CULT (A) 06/13/2024 1329    <10,000 COLONIES/mL INSIGNIFICANT GROWTH Performed at [facility], [address] [place], [place] [address]    REPTSTATUS 06/15/2024 FINAL 06/13/2024 1329    Mennie LAMY, MD [facility] 07/07/2024, 11:03 AM

## 2024-07-07 NOTE — Progress Notes (Signed)
 Pt arrived from ..ED..., A/ox .4.Marland Kitchenpt denies any pain, MD aware,CCMD called. CHG bath given,no further needs at this time

## 2024-07-08 ENCOUNTER — Telehealth: Payer: Self-pay

## 2024-07-08 ENCOUNTER — Encounter (HOSPITAL_COMMUNITY): Payer: Self-pay | Admitting: Vascular Surgery

## 2024-07-08 DIAGNOSIS — I82422 Acute embolism and thrombosis of left iliac vein: Principal | ICD-10-CM

## 2024-07-08 DIAGNOSIS — E43 Unspecified severe protein-calorie malnutrition: Secondary | ICD-10-CM | POA: Insufficient documentation

## 2024-07-08 DIAGNOSIS — I82412 Acute embolism and thrombosis of left femoral vein: Secondary | ICD-10-CM

## 2024-07-08 DIAGNOSIS — I82432 Acute embolism and thrombosis of left popliteal vein: Secondary | ICD-10-CM

## 2024-07-08 DIAGNOSIS — I871 Compression of vein: Secondary | ICD-10-CM | POA: Diagnosis not present

## 2024-07-08 DIAGNOSIS — Z9889 Other specified postprocedural states: Secondary | ICD-10-CM

## 2024-07-08 DIAGNOSIS — R531 Weakness: Secondary | ICD-10-CM

## 2024-07-08 LAB — CBC
HCT: 31.3 % — ABNORMAL LOW (ref 36.0–46.0)
Hemoglobin: 9.6 g/dL — ABNORMAL LOW (ref 12.0–15.0)
MCH: 27 pg (ref 26.0–34.0)
MCHC: 30.7 g/dL (ref 30.0–36.0)
MCV: 88.2 fL (ref 80.0–100.0)
Platelets: 303 K/uL (ref 150–400)
RBC: 3.55 MIL/uL — ABNORMAL LOW (ref 3.87–5.11)
RDW: 18.7 % — ABNORMAL HIGH (ref 11.5–15.5)
WBC: 10.2 K/uL (ref 4.0–10.5)
nRBC: 0 % (ref 0.0–0.2)

## 2024-07-08 LAB — BASIC METABOLIC PANEL WITH GFR
Anion gap: 8 (ref 5–15)
BUN: 7 mg/dL — ABNORMAL LOW (ref 8–23)
CO2: 25 mmol/L (ref 22–32)
Calcium: 8.6 mg/dL — ABNORMAL LOW (ref 8.9–10.3)
Chloride: 105 mmol/L (ref 98–111)
Creatinine, Ser: 0.62 mg/dL (ref 0.44–1.00)
GFR, Estimated: >60 mL/min (ref >60)
Glucose, Bld: 104 mg/dL — ABNORMAL HIGH (ref 70–99)
Potassium: 3.9 mmol/L (ref 3.5–5.1)
Sodium: 138 mmol/L (ref 135–145)

## 2024-07-08 LAB — HEPARIN LEVEL (UNFRACTIONATED): Heparin Unfractionated: 0.5 [IU]/mL (ref 0.30–0.70)

## 2024-07-08 MED ORDER — ADULT MULTIVITAMIN W/MINERALS CH
1.0000 | ORAL_TABLET | Freq: Every day | ORAL | Status: DC
  Administered 2024-07-08 – 2024-07-12 (×5): 1 via ORAL
  Filled 2024-07-08 (×5): qty 1

## 2024-07-08 MED ORDER — BOOST PLUS PO LIQD
237.0000 mL | Freq: Two times a day (BID) | ORAL | Status: DC
  Administered 2024-07-08 – 2024-07-12 (×7): 237 mL via ORAL
  Filled 2024-07-08 (×9): qty 237

## 2024-07-08 NOTE — NC FL2 (Signed)
 [place]  MEDICAID FL2 LEVEL OF CARE FORM     IDENTIFICATION  Patient Name: Kristine [name] Birthdate: 1941-08-10 Sex: female Admission Date (Current Location): 07/06/2024  [facility] and [place] Number:  Producer, television/film/video and Address:  [facility], [address] [place], [place] [address]      Provider Number: [id number]  Attending Physician Name and Address:  Soledad Ligas, DO  Relative Name and Phone Number:       Current Level of Care: Hospital Recommended Level of Care: Skilled Nursing Facility Prior Approval Number:    Date Approved/Denied:   PASRR Number: [id number]  Discharge Plan: SNF    Current Diagnoses: Patient Active Problem List   Diagnosis Date Noted   Protein-calorie malnutrition, severe 07/08/2024   Pulmonary embolism (HCC) 07/06/2024   Acute deep vein thrombosis (DVT) of left lower extremity (HCC) 07/06/2024   Melena 06/17/2024   Epigastric abdominal pain 06/15/2024   Iron  deficiency anemia due to chronic blood loss 06/15/2024   UTI (urinary tract infection) 06/13/2024   Weakness 06/13/2024   Hypotension 06/13/2024   Near syncope 06/13/2024   Diarrhea 06/13/2024   Syncope, vasovagal 06/06/2024   Acute on chronic diastolic CHF (congestive heart failure) (HCC) 06/06/2024   Dyspnea 06/05/2024   Upper respiratory tract infection 09/27/2022   Mild intermittent reactive airway disease 07/24/2022   Chronic rhinitis 07/24/2022   Vertigo 07/24/2021   Pseudophakia of left eye 05/31/2021   Pseudophakia of right eye 05/31/2021   History of osteoporosis 05/27/2021   Mononucleosis 05/27/2021   Cystoid macular edema of left eye 10/11/2020   NSTEMI (non-ST elevated myocardial infarction) (HCC) 10/09/2020   Type 2 macular telangiectasis, right 03/20/2020   Cystoid macular edema of right eye 03/20/2020   Type 2 macular telangiectasis, left 03/20/2020   OSA on CPAP 03/20/2020   Cr(e)st syndrome (HCC) 05/25/2018   Ulcer of esophagus with  bleeding 02/27/2018   Psychophysiological insomnia 09/24/2017   Abnormal nuclear cardiac imaging test 05/30/2017   Abnormal stress echo    Solar lentigo 11/08/2016   Acute blood loss anemia 10/10/2015   Red blood cell antibody positive 10/04/2015   S/P coronary artery stent placement 07/12/2014   CAD (coronary artery disease), native coronary artery 06/27/2013   Hyperlipidemia 06/27/2013   Chest pain of uncertain etiology 06/26/2013   History of ST elevation myocardial infarction (STEMI) 06/24/2013   Hypertension    Great toe pain 09/11/2012   H/O foot surgery 04/09/2012   Hallux varus, acquired 02/25/2012   GERD (gastroesophageal reflux disease)    Barrett's esophagus    Cystocele    Rectocele     Orientation RESPIRATION BLADDER Height & Weight     Self, Time, Situation, Place  Normal Continent Weight: 104 lb 11.5 oz (47.5 kg) Height:  5' (152.4 cm)  BEHAVIORAL SYMPTOMS/MOOD NEUROLOGICAL BOWEL NUTRITION STATUS      Continent Diet (please see discharge summary)  AMBULATORY STATUS COMMUNICATION OF NEEDS Skin   Limited Assist Verbally                         Personal Care Assistance Level of Assistance  Bathing, Feeding, Dressing Bathing Assistance: Limited assistance Feeding assistance: Limited assistance Dressing Assistance: Limited assistance     Functional Limitations Info  Sight, Hearing, Speech Sight Info: Adequate Hearing Info: Impaired Speech Info: Adequate    SPECIAL CARE FACTORS FREQUENCY  PT (By licensed PT), OT (By licensed OT)     PT Frequency:  5x per week OT Frequency: 5x per week            Contractures Contractures Info: Not present    Additional Factors Info  Code Status, Allergies Code Status Info: FULL Allergies Info: Nsaids Not Specified Contraindication Other (See Comments) Prone to bleeding  Aspirin  Low Contraindication           Current Medications (07/08/2024):  This is the current hospital active medication list Current  Facility-Administered Medications  Medication Dose Route Frequency Provider Last Rate Last Admin   albuterol  (PROVENTIL ) (2.5 MG/3ML) 0.083% nebulizer solution 2.5 mg  2.5 mg Nebulization Q4H PRN Garba, Mohammad L, MD       aspirin  EC tablet 81 mg  81 mg Oral Daily Kc, Ramesh, MD   81 mg at 07/08/24 0943   famotidine  (PEPCID ) tablet 40 mg  40 mg Oral QHS Sim Emery CROME, MD   40 mg at 07/07/24 2205   ferrous sulfate  tablet 325 mg  325 mg Oral Q breakfast Sim Emery CROME, MD   325 mg at 07/08/24 9057   fluticasone  furoate-vilanterol (BREO ELLIPTA ) 100-25 MCG/ACT 1 puff  1 puff Inhalation q AM Christobal Guadalajara, MD   1 puff at 07/08/24 0826   heparin  ADULT infusion 100 units/mL (25000 units/250mL)  800 Units/hr Intravenous Continuous Gaines Carrier, RPH 8 mL/hr at 07/07/24 1546 800 Units/hr at 07/07/24 1546   lactose free nutrition (BOOST PLUS) liquid 237 mL  237 mL Oral BID BM Swayze, Ava, DO   237 mL at 07/08/24 1234   metoprolol  succinate (TOPROL -XL) 24 hr tablet 12.5 mg  12.5 mg Oral q AM Sim Emery L, MD   12.5 mg at 07/08/24 0943   morphine  (PF) 2 MG/ML injection 2 mg  2 mg Intravenous Q2H PRN Garba, Mohammad L, MD   2 mg at 07/08/24 0148   multivitamin with minerals tablet 1 tablet  1 tablet Oral Daily Swayze, Ava, DO   1 tablet at 07/08/24 1234   nitroGLYCERIN  (NITROSTAT ) SL tablet 0.4 mg  0.4 mg Sublingual Q5 Min x 3 PRN Sim Emery CROME, MD       ondansetron  (ZOFRAN ) tablet 4 mg  4 mg Oral Q6H PRN Sim Emery CROME, MD       Or   ondansetron  (ZOFRAN ) injection 4 mg  4 mg Intravenous Q6H PRN Sim Emery CROME, MD       pantoprazole  (PROTONIX ) EC tablet 40 mg  40 mg Oral BID Garba, Mohammad L, MD   40 mg at 07/08/24 9056   pravastatin  (PRAVACHOL ) tablet 40 mg  40 mg Oral QPM Sim Emery CROME, MD   40 mg at 07/07/24 1616     Discharge Medications: Please see discharge summary for a list of discharge medications.  Relevant Imaging Results:  Relevant Lab Results:   Additional  Information SSN [id number]    FYI patient d/c form [facility] 07/02/24  (dates there- 7/18-7/20 & 7/25-8/8)  Montie LOISE Louder, LCSW

## 2024-07-08 NOTE — Progress Notes (Signed)
 Initial Nutrition Assessment  DOCUMENTATION CODES:  Severe malnutrition in context of chronic illness  INTERVENTION:  Continue regular diet as ordered Boost Plus po BID, each supplement provides 360 kcal and 14 grams of protein MVI with minerals daily High Calorie, High Protein Nutrition Therapy handout added to AVS  NUTRITION DIAGNOSIS:  Severe Malnutrition related to chronic illness (GERD, Barrett's esophagus, ischemic cardiomyopathy, NSTEMI) as evidenced by severe fat depletion, severe muscle depletion.  GOAL:   Patient will meet greater than or equal to 90% of their needs  MONITOR:   PO intake, Supplement acceptance, Labs, Weight trends  REASON FOR ASSESSMENT:   Malnutrition Screening Tool    ASSESSMENT:   Pt admitted from PCP for further evaluation of LLE d/t DVT. PMH significant for crest syndrome, GERD, CAD, Barrett's esophagus, anemia of chronic disease, HTN, HLD, ischemic cardiomyopathy, Raynaud's disease, diverticular disease, COPD. Recently admitted for NSTEMI 7/10 and suspected GI bleed 7/28  8/14: s/p mechanical thrombectomy of left external iliac vein, common iliac vein, common femoral vein, formal vein and popliteal vein with balloon neoplasty of the left external and common iliac veins  Pt sitting up in bed at time of visit with breakfast tray on table. She is very pleasant and in good spirits. She had received grits and decaf coffee for breakfast but when it was delivered she just did not have an appetite to eat, therefore only had a couple bites of her grits. Last night she ordered chicken with mashed potatoes and green beans but does not recall how much she consumed.   Pt reports that d/t her GERD and ulcers in her throat she does not consume spicy or acidic food. While at [employer] she reports losing a lot of weight because that was the type of food that she was provided so she would not eat.   She reports that her daughter makes her protein shakes and  mushroom coffee in the mornings but unclear how often she consumes these. She does not like Ensure but is amenable to Boost.   Further nutrition/weight history limited as pt veered off topic easily despite redirection.   Review of chart reflects pt with a weight loss of 9.9% from 10/13/23-06/07/24 which is not clinically significant for time frame. Current weight likely copied forward from prior documented weight. Unable to assess for any ongoing weight loss. Will continue to monitor.   Medications: ferrous sulfate  daily  Labs: reviewed  NUTRITION - FOCUSED PHYSICAL EXAM: Flowsheet Row Most Recent Value  Orbital Region Severe depletion  Upper Arm Region Severe depletion  Thoracic and Lumbar Region Moderate depletion  Buccal Region Severe depletion  Temple Region Severe depletion  Clavicle Bone Region Severe depletion  Clavicle and Acromion Bone Region Severe depletion  Scapular Bone Region Severe depletion  Dorsal Hand Severe depletion  Patellar Region Severe depletion  Anterior Thigh Region Severe depletion  Posterior Calf Region Severe depletion  [UTA LLE d/t post-op edema]  Edema (RD Assessment) Mild  [LLE]  Hair Reviewed  Eyes Reviewed  Mouth Reviewed  [dentures]  Skin Reviewed  Nails Reviewed    Diet Order:   Diet Order             Diet regular Fluid consistency: Thin  Diet effective now                   EDUCATION NEEDS:  Education needs have been addressed  Skin:  Skin Assessment: Reviewed RN Assessment  Last BM:  8/13 type4, type 5 small  Height:  Ht Readings from Last 1 Encounters:  07/06/24 5' (1.524 m)    Weight:  Wt Readings from Last 1 Encounters:  07/06/24 47.5 kg    BMI:  Body mass index is 20.45 kg/m.  Estimated Nutritional Needs:   Kcal:  1200-1400  Protein:  60-70g  Fluid:  1.2-1.4L  Royce Maris, RDN, LDN Clinical Nutrition See AMiON for contact information.

## 2024-07-08 NOTE — Plan of Care (Signed)
   Problem: Clinical Measurements: Goal: Ability to maintain clinical measurements within normal limits will improve Outcome: Progressing Goal: Will remain free from infection Outcome: Progressing Goal: Diagnostic test results will improve Outcome: Progressing Goal: Respiratory complications will improve Outcome: Progressing Goal: Cardiovascular complication will be avoided Outcome: Progressing   Problem: Coping: Goal: Level of anxiety will decrease Outcome: Progressing

## 2024-07-08 NOTE — Progress Notes (Addendum)
 Progress Note    07/08/2024 9:16 AM 1 Day Post-Op  Subjective: She says that her left leg hurt overnight due to the compression wrap.  Her leg feels much better now that the compression wrap is off this morning    Vitals:   07/08/24 0807 07/08/24 0826  BP: (!) 141/82   Pulse: 90   Resp: (!) 23   Temp: 97.8 F (36.6 C)   SpO2:  96%    Physical Exam: General: Laying in bed, NAD Cardiac: Regular Lungs: Nonlabored Incisions: Left popliteal vein access site bandaged and dry without hematoma Extremities: Continued LLE edema, however improved   CBC    Component Value Date/Time   WBC 10.2 07/08/2024 0341   RBC 3.55 (L) 07/08/2024 0341   HGB 9.6 (L) 07/08/2024 0341   HGB 11.1 10/13/2023 0951   HCT 31.3 (L) 07/08/2024 0341   HCT 38.2 10/13/2023 0951   PLT 303 07/08/2024 0341   PLT 207 10/13/2023 0951   MCV 88.2 07/08/2024 0341   MCV 80 10/13/2023 0951   MCH 27.0 07/08/2024 0341   MCHC 30.7 07/08/2024 0341   RDW 18.7 (H) 07/08/2024 0341   RDW 17.1 (H) 10/13/2023 0951   LYMPHSABS 1.4 06/13/2024 0932   LYMPHSABS 2.1 03/23/2019 0000   MONOABS 2.7 (H) 06/13/2024 0932   EOSABS 0.1 06/13/2024 0932   EOSABS 0.2 03/23/2019 0000   BASOSABS 0.1 06/13/2024 0932   BASOSABS 0.1 03/23/2019 0000    BMET    Component Value Date/Time   NA 138 07/08/2024 0341   NA 139 10/13/2023 0951   K 3.9 07/08/2024 0341   CL 105 07/08/2024 0341   CO2 25 07/08/2024 0341   GLUCOSE 104 (H) 07/08/2024 0341   BUN 7 (L) 07/08/2024 0341   BUN 16 10/13/2023 0951   CREATININE 0.62 07/08/2024 0341   CREATININE 0.75 05/24/2021 0000   CALCIUM  8.6 (L) 07/08/2024 0341   GFRNONAA >60 07/08/2024 0341   GFRNONAA 76 05/24/2021 0000   GFRAA 88 05/24/2021 0000    INR    Component Value Date/Time   INR 0.9 05/26/2017 1523     Intake/Output Summary (Last 24 hours) at 07/08/2024 0916 Last data filed at 07/07/2024 2100 Gross per 24 hour  Intake 221.82 ml  Output --  Net 221.82 ml       Assessment/Plan:  83 y.o. female is 1 day postop, s/p: Mechanical thrombectomy of left external iliac vein, common iliac vein, common femoral vein, femoral vein, and popliteal vein with balloon venoplasty of the left external and common iliac veins   - The patient states that her left leg hurt overnight due to the tightness of her compression wrap.  Her leg feels much better this morning after I took off the compression wrap -Her left leg is still edematous from the thigh to the foot, however this is improved -Left popliteal vein access site is bandaged and dry -Left lower extremity well-perfused with intact motor and sensation -Okay for transition from heparin  to DOAC today.  Stable for discharge home today from a vascular standpoint after transition to DOAC.  Will order the patient thigh-high compression stockings to help with her swelling postop -Will arrange follow-up in 1 month with noninvasive studies   Ahmed Holster, PA-C [facility] [phone] 07/08/2024 9:16 AM  VASCULAR STAFF ADDENDUM: I have independently interviewed and examined the patient. I agree with the above.    Fonda FORBES Rim MD [facility] Phone Number: [phone]  [phone] 07/08/2024 5:15 PM

## 2024-07-08 NOTE — Discharge Instructions (Addendum)
 High-Calorie, High-Protein Nutrition Therapy (2021) A high-calorie, high-protein diet has been recommended to you. Your registered dietitian nutritionist (RDN) may have recommended this diet because you are having difficulty eating enough calories throughout the day, you have lost weight, and/or you need to add protein to your diet. Sometimes you may not feel like eating, even if you know the importance of good nutrition. The recommendations in this handout can help you with the following: Regaining your strength and energy Keeping your body healthy Healing and recovering from surgery or illness and fighting infection Tips: Schedule Your Meals and Snacks Several small meals and snacks are often better tolerated and digested than large meals. Strategies Plan to eat 3 meals and 3 snacks daily. Experiment with timing meals to find out when you have a larger appetite. Appetite may be greatest in the morning after not eating all night so you may prefer to eat your larger meals and snacks in the morning and at lunch. Breakfast-type foods are often better tolerated so eat foods such as eggs, pancakes, waffles and cereal for any meal or snack. Carry snacks with you so you are prepared to eat every 2 to 3 hours. Determine what works best for you if your body's cues for feeling hungry or full are not working. Eat a small meal or snack even if you don't feel hungry. Set a timer to remind you when it is time to eat. Take a walk before you eat (with health care provider's approval). Light or moderate physical activity can help you maintain muscle and increase your appetite. Make Eating Enjoyable Taking steps to make the experience enjoyable may help to increase your interest in eating and improve your appetite. Strategies: Eat with others whenever possible. Include your favorite foods to make meals more enjoyable. Try new foods. Save your beverage for the end of the meal so that you have more room for  food before you get full. Add Calories to Your Meals and Snacks Try adding calorie-dense foods so that each bite provides more nutrition. Strategies Drink milk, chocolate milk, soy milk, or smoothies instead of low-calorie beverages such as diet drinks or water. Cook with milk or soy milk instead of water when making dishes such as hot cereal, cocoa, or pudding. Add jelly, jam, honey, butter or margarine to bread and crackers. Add jam or fruit to ice cream and as a topping over cake. Mix dried fruit, nuts, granola, honey, or dry cereal with yogurt or hot cereals. Enjoy snacks such as milkshakes, smoothies, pudding, ice cream, or custard. Blend a fruit smoothie of a banana, frozen berries, milk or soy milk, and 1 tablespoon nonfat powdered milk or protein powder. Add Protein to Your Meals and Snacks Choose at least one protein food at each meal and snack to increase your daily intake. Strategies Add  cup nonfat dry milk powder or protein powder to make a high-protein milk to drink or to use in recipes that call for milk. Vanilla or peppermint extract or unsweetened cocoa powder could help to boost the flavor. Add hard-cooked eggs, leftover meat, grated cheese, canned beans or tofu to noodles, rice, salads, sandwiches, soups, casseroles, pasta, tuna and other mixed dishes. Add powdered milk or protein powder to hot cereals, meatloaf, casseroles, scrambled eggs, sauces, cream soups, and shakes. Add beans and lentils to salads, soups, casseroles, and vegetable dishes. Eat cottage cheese or yogurt, especially Greek yogurt, with fruit as a snack or dessert. Eat peanut or other nut butters on crackers, bread, toast,  waffles, apples, bananas or celery sticks. Add it to milkshakes, smoothies, or desserts. Consider a ready-made protein shake. Your RDN will make recommendations. Add Fats to Your Meals and Snacks Try adding fats to your meals and snacks. Fat provides more calories in fewer bites than  carbohydrate or protein and adds flavors to your foods. Strategies Snack on nuts and seeds or add them to foods like salads, pasta, cereals, yogurt, and ice cream.  Saut or stir-fry vegetables, meats, chicken, fish or tofu in olive or canola oil.  Add olive oil, other vegetable oils, butter or margarine to soups, vegetables, potatoes, cooked cereal, rice, pasta, bread, crackers, pancakes, or waffles. Snack on olives or add to pasta, pizza, or salad. Add avocado or guacamole to your salads, sandwiches, and other entrees. Include fatty fish such as salmon in your weekly meal plan. For general food safety tips, especially for clients with immunocompromised conditions, ask your RDN for the Food Safety Nutrition Therapy handout. Small Meal and Snack Ideas These snacks and meals are recommended when you have to eat but aren't necessarily hungry.  They are good choices because they are high in protein and high in calories.  2 graham crackers 2 tablespoons peanut or other nut butter 1 cup milk 2 slices whole wheat toast topped with:  avocado, mashed Seasoning of your choice   cup Greek yogurt  cup fruit  cup granola 2 deviled egg halves 5 whole wheat crackers  1 cup cream of tomato soup  grilled cheese sandwich 1 toasted waffle topped with: 2 tablespoons peanut or nut butter 1 tablespoon jam  Trail mix made with:  cup nuts  cup dried fruit  cup cold cereal, any variety  cup oatmeal or cream of wheat cereal 1 tablespoon peanut or nut butter  cup diced fruit   High-Calorie, High-Protein Sample 1-Day Menu View Nutrient Info Breakfast 1 egg, scrambled 1 ounce cheddar cheese 1 English muffin, whole wheat 1 tablespoon margarine 1 tablespoon jam  cup orange juice, fortified with calcium  and vitamin D   Morning Snack 1 tablespoon peanut butter 1 banana 1 cup 1% milk  Lunch Tuna salad sandwich made with: 2 slices bread, whole wheat 3 ounces tuna mixed with: 1 tablespoon  mayonnaise  cup pudding  Afternoon Snack  cup hummus  cup carrots 1 pita  Evening Meal Enchilada casserole made with: 2 corn tortillas 3 ounces ground beef, cooked  cup black beans, cooked  cup corn, cooked 1 ounce grated cheddar cheese  cup enchilada sauce  avocado, sliced, topping for enchilada 1 tablespoon sour cream, topping for enchilada Salad:  cup lettuce, shredded  cup tomatoes, chopped, for salad 1 tablespoon olive oil and vinegar dressing, for salad  Evening Snack  cup Greek yogurt  cup blueberries  cup granola        Information on my medicine - ELIQUIS  (apixaban )  This medication education was reviewed with me or my healthcare representative as part of my discharge preparation.  Why was Eliquis  prescribed for you? Eliquis  was prescribed to treat blood clots that may have been found in the veins of your legs (deep vein thrombosis) or in your lungs (pulmonary embolism) and to reduce the risk of them occurring again.  What do You need to know about Eliquis  ? The starting dose is 10 mg (two 5 mg tablets) taken TWICE daily for the FIRST SEVEN (7) DAYS, then on  07/16/24  the dose is reduced to ONE 5 mg tablet taken TWICE daily.  Eliquis  may be  taken with or without food.   Try to take the dose about the same time in the morning and in the evening. If you have difficulty swallowing the tablet whole please discuss with your pharmacist how to take the medication safely.  Take Eliquis  exactly as prescribed and DO NOT stop taking Eliquis  without talking to the doctor who prescribed the medication.  Stopping may increase your risk of developing a new blood clot.  Refill your prescription before you run out.  After discharge, you should have regular check-up appointments with your healthcare provider that is prescribing your Eliquis .    What do you do if you miss a dose? If a dose of ELIQUIS  is not taken at the scheduled time, take it as soon as possible  on the same day and twice-daily administration should be resumed. The dose should not be doubled to make up for a missed dose.  Important Safety Information A possible side effect of Eliquis  is bleeding. You should call your healthcare provider right away if you experience any of the following: Bleeding from an injury or your nose that does not stop. Unusual colored urine (red or dark brown) or unusual colored stools (red or black). Unusual bruising for unknown reasons. A serious fall or if you hit your head (even if there is no bleeding).  Some medicines may interact with Eliquis  and might increase your risk of bleeding or clotting while on Eliquis . To help avoid this, consult your healthcare provider or pharmacist prior to using any new prescription or non-prescription medications, including herbals, vitamins, non-steroidal anti-inflammatory drugs (NSAIDs) and supplements.  This website has more information on Eliquis  (apixaban ): http://[email or web address] .com/eliquis dena

## 2024-07-08 NOTE — TOC Progression Note (Addendum)
 Transition of Care [facility]) - Progression Note    Patient Details  Name: Kristine [name] MRN: [id number] Date of Birth: Apr 29, 1941  [facility] CM/SW Contact  Montie LOISE Louder, [place] Phone Number: 07/08/2024, 11:34 AM  Clinical Narrative:     11:15 am CSW returned patient's daughter call as requested. CSW introduced self and explained role. CSW discussed recommendation for short term rehab. She reports, the patient needs a little more therapy before returning home. She states patient discharged home form [facility] on 07/02/24. She is agreeable to patient going to SNF but want to explore other SNF options. CSW explained the SNF process and the possibility of patient being in copay status. She states understanding and wants to move forward with SNF search.  Preferred SNF is [facility].   11:44 am CSW met with patient- she is agreeable to short term rehab at [facility].   TOC will provide bed offers once available  Montie Louder, MSW, LCSW Clinical Social Worker     Expected Discharge Plan: Skilled Nursing Facility Barriers to Discharge: English as a second language teacher, SNF Pending bed offer               Expected Discharge Plan and Services In-house Referral: Clinical Social Work                                             Social Drivers of Health (SDOH) Interventions SDOH Screenings   Food Insecurity: No Food Insecurity (07/07/2024)  Recent Concern: Food Insecurity - Food Insecurity Present (06/29/2024)   Received from [facility]  Housing: High Risk (07/07/2024)  Transportation Needs: No Transportation Needs (07/07/2024)  Utilities: Not At Risk (07/07/2024)  Depression (PHQ2-9): Low Risk  (07/24/2021)  Recent Concern: Depression (PHQ2-9) - Medium Risk (05/25/2021)  Financial Resource Strain: High Risk (06/29/2024)   Received from [facility]  Physical Activity: Inactive (06/29/2024)   Received from [facility]  Social Connections: Socially Isolated (07/07/2024)   Stress: Stress Concern Present (06/29/2024)   Received from [facility]  Tobacco Use: Low Risk  (07/06/2024)   Received from [facility]    Readmission Risk Interventions     No data to display

## 2024-07-08 NOTE — Progress Notes (Signed)
 PROGRESS NOTE Kristine [name]  FMW:[id number] DOB: November 24, 1941 DOA: 07/06/2024 PCP: [name], MD  Brief Narrative/Hospital Course: The patient is a 83 yr old woman who carries a medical history significant for crest syndrome, GERD, coronary artery disease, Barrett's esophagus, anemia of chronic disease, essential hypertension, hyperlipidemia, ischemic cardiomyopathy, coronary artery disease, obstructive sleep apnea, osteoporosis, Raynaud's disease, diverticular disease, COPD and asthma who was recently admitted on July 28 with suspected GI bleed. She lives in [place]. She had diarrhea weakness and low blood pressure at the time. Patient also had NSTEMI back in July 10. Patient brought in today with swelling of the left lower extremity. It was markedly swollen warm and tender. She went to the pain clinic today reports that the swelling has been going on for about 5 days. Her PCP sent her there first. Doppler ultrasound was performed that showed extensive DVT of the left lower extremity so she was sent to the ER for further evaluation and treatment. In the ED further evaluation confirmed the DVT with CT venogram done showed extensive left-sided DVT from at least the confluence of the left calf veins to the compress of the iliac veins. Subsequent CT angiogram of the chest showed a small right lower lobe subsegmental pulmonary embolus. No right heart strain. Vascular surgery was consulted with plan for thrombectomy  and admitted.  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 The patient underwent angiogram with extensive percutaneous mechanical thrombectomy and balloon  venoplasty with vascular surgery on 07/07/2024. The patient has tolerated the procedure well.    GERD: Continue PPI.   Hypertension CAD-S/P coronary artery stent placement Hyperlipidemia: No chest pain.  BP remains controlled.  Continue metoprolol , pravastatin , aspirin , heparin  as above   Iron  deficiency anemia history: Hemoglobin stable continue iron  supplement.   COPD: Continue her Breo, bronchodilators   OSA: con CPAP   Cr(e)st syndrome Stable.    Mobility: Currently bedrest PT Orders:  PT Follow up Rec:     DVT prophylaxis:  Code Status:   Code Status: Full Code Family Communication: plan of care discussed with patient at bedside. Patient status is: Remains hospitalized because of severity of illness Level of care: Telemetry Cardiac  Data Reviewed: I have personally reviewed following labs and imaging studies ( see epic result tab) CBC: Recent Labs  Lab 07/06/24 1754 07/07/24 0241 07/08/24 0341  WBC 16.3* 11.1* 10.2  HGB 12.0 10.9* 9.6*  HCT 40.1 35.9* 31.3*  MCV 90.5 89.3 88.2  PLT 322 299 303   CMP: Recent Labs  Lab 07/06/24 1754 07/07/24 0241 07/08/24 0341  NA 138 140 138  K 4.4 4.3 3.9  CL 103 106 105  CO2 24 24 25   GLUCOSE 94 103* 104*  BUN 10 10 7*  CREATININE 0.72 0.65 0.62  CALCIUM  9.2 8.8* 8.6*   GFR: Estimated Creatinine Clearance: 39.6 mL/min (by C-G formula based on SCr of 0.62 mg/dL). Recent Labs  Lab 07/07/24 0241  AST 16  ALT 11  ALKPHOS 79  BILITOT 0.6  PROT 5.1*  ALBUMIN 2.4*   No results for input(s): LIPASE, AMYLASE in the last 168 hours. No results for input(s): AMMONIA in the last 168 hours. Coagulation Profile: No results  for input(s): INR, PROTIME in the last 168 hours. Unresulted Labs (From admission, onward)     Start     Ordered   07/08/24 0500  Heparin  level (unfractionated)  Daily,   R     Placed in And Linked Group   07/06/24 1740   07/07/24 0500  CBC  Daily,   R     Placed in And Linked Group    07/06/24 1740           Vitals:   07/08/24 1200 07/08/24 1710  BP: (!) 105/52 (!) 120/58  Pulse: 89 87  Resp: 16   Temp: 98.4 F (36.9 C) 97.9 F (36.6 C)  SpO2:     Exam:  Constitutional:  The patient is awake, alert, and oriented x 3. No acute distress. Eyes:  pupils and irises appear normal Normal lids and conjunctivae ENMT:  grossly normal hearing  Lips appear normal external ears, nose appear normal Oropharynx: mucosa, tongue,posterior pharynx appear normal Neck:  neck appears normal, no masses, normal ROM, supple no thyromegaly Respiratory:  No increased work of breathing. No wheezes, rales, or rhonchi No tactile fremitus Cardiovascular:  Regular rate and rhythm No murmurs, ectopy, or gallups. No lateral PMI. No thrills. Abdomen:  Abdomen is soft, non-tender, non-distended No hernias, masses, or organomegaly Normoactive bowel sounds.  Musculoskeletal:  No cyanosis, clubbing, or edema Skin:  No rashes, lesions, ulcers palpation of skin: no induration or nodules Neurologic:  CN 2-12 intact Sensation all 4 extremities intact Psychiatric:  Mental status Mood, affect appropriate Orientation to person, place, time  judgment and insight appear intact  Antimicrobials/Microbiology: Anti-infectives (From admission, onward)    None         Component Value Date/Time   SDES URINE, CLEAN CATCH 06/13/2024 1329   SPECREQUEST NONE 06/13/2024 1329   CULT (A) 06/13/2024 1329    <10,000 COLONIES/mL INSIGNIFICANT GROWTH Performed at [facility], [address], [place], [place] [address]    REPTSTATUS 06/15/2024 FINAL 06/13/2024 1329    Brigida Bureau, MD [facility] 07/08/2024, 6:45 PM

## 2024-07-08 NOTE — Progress Notes (Signed)
 PHARMACY - ANTICOAGULATION CONSULT NOTE  Pharmacy Consult for heparin  Indication: DVT, PE  Allergies  Allergen Reactions   Nsaids Other (See Comments)    Prone to bleeding   Aspirin  Other (See Comments)    Prone to bleeding OK to take low-dose EC but not aspirin  based medications.    Patient Measurements: Height: 5' (152.4 cm) Weight: 47.5 kg (104 lb 11.5 oz) IBW/kg (Calculated) : 45.5 HEPARIN  DW (KG): 47.5  Vital Signs: Temp: 97.8 F (36.6 C) (08/14 0807) Temp Source: Oral (08/14 0807) BP: 141/82 (08/14 0807) Pulse Rate: 90 (08/14 0807)  Labs: Recent Labs    07/06/24 1754 07/06/24 1940 07/07/24 0241 07/07/24 1000 07/08/24 0341  HGB 12.0  --  10.9*  --  9.6*  HCT 40.1  --  35.9*  --  31.3*  PLT 322  --  299  --  303  HEPARINUNFRC  --   --  0.40 0.36 0.50  CREATININE 0.72  --  0.65  --  0.62  TROPONINIHS 15 16  --   --   --     Estimated Creatinine Clearance: 39.6 mL/min (by C-G formula based on SCr of 0.62 mg/dL).  Assessment: 76 YOF presenting from clinic with positive DVT, she is not on anticoagulation PTA, CT angio chest also with small RLL PE.  Heparin  level remains therapeutic on heparin  at 800 units/hr.   Goal of Therapy:  Heparin  level 0.3-0.7 units/ml Monitor platelets by anticoagulation protocol: Yes   Plan:  Continue heparin  gtt at 800 units/hr Daily heparin  level and CBC  Eloy Fehl, Pharm.D., BCPS Clinical Pharmacist Clinical phone for 07/08/2024 from 7:30-3:00 is [phone].  **Pharmacist phone directory can be found on [email or web address] listed under [facility].  07/08/2024 8:26 AM

## 2024-07-09 ENCOUNTER — Telehealth (HOSPITAL_COMMUNITY): Payer: Self-pay | Admitting: Pharmacy Technician

## 2024-07-09 ENCOUNTER — Other Ambulatory Visit (HOSPITAL_COMMUNITY): Payer: Self-pay

## 2024-07-09 ENCOUNTER — Other Ambulatory Visit: Payer: Self-pay

## 2024-07-09 DIAGNOSIS — I824Y2 Acute embolism and thrombosis of unspecified deep veins of left proximal lower extremity: Secondary | ICD-10-CM

## 2024-07-09 DIAGNOSIS — I871 Compression of vein: Secondary | ICD-10-CM | POA: Diagnosis not present

## 2024-07-09 LAB — CBC
HCT: 30 % — ABNORMAL LOW (ref 36.0–46.0)
Hemoglobin: 9.1 g/dL — ABNORMAL LOW (ref 12.0–15.0)
MCH: 26.8 pg (ref 26.0–34.0)
MCHC: 30.3 g/dL (ref 30.0–36.0)
MCV: 88.2 fL (ref 80.0–100.0)
Platelets: 300 K/uL (ref 150–400)
RBC: 3.4 MIL/uL — ABNORMAL LOW (ref 3.87–5.11)
RDW: 18.6 % — ABNORMAL HIGH (ref 11.5–15.5)
WBC: 9.3 K/uL (ref 4.0–10.5)
nRBC: 0 % (ref 0.0–0.2)

## 2024-07-09 LAB — HEPARIN LEVEL (UNFRACTIONATED): Heparin Unfractionated: 0.59 [IU]/mL (ref 0.30–0.70)

## 2024-07-09 MED ORDER — APIXABAN 5 MG PO TABS
10.0000 mg | ORAL_TABLET | Freq: Two times a day (BID) | ORAL | Status: DC
  Administered 2024-07-09 – 2024-07-12 (×7): 10 mg via ORAL
  Filled 2024-07-09 (×7): qty 2

## 2024-07-09 MED ORDER — GUAIFENESIN-DM 100-10 MG/5ML PO SYRP
5.0000 mL | ORAL_SOLUTION | ORAL | Status: DC | PRN
  Administered 2024-07-09 – 2024-07-11 (×2): 5 mL via ORAL
  Filled 2024-07-09 (×2): qty 5

## 2024-07-09 MED ORDER — APIXABAN 5 MG PO TABS: 5.0000 mg | ORAL_TABLET | Freq: Two times a day (BID) | ORAL | Status: DC

## 2024-07-09 NOTE — TOC Progression Note (Signed)
 Transition of Care [facility]) - Progression Note    Patient Details  Name: Kristine [name] MRN: [id number] Date of Birth: Jan 15, 1941  Transition of Care [facility]) CM/SW Contact  Montie LOISE Louder, [place] Phone Number: 07/09/2024, 1:33 PM  Clinical Narrative:     CSW called patient's daughter- CSW informed [facility] advised patient was in copay status and she would need to pay estimate of 200.00 per day. The SNF required 2 weeks in advance. She states the patient only needs  a couple of days of therapy and they could not afford the 2 weeks co payment up front. She explained she has been told by attendings different anticipated d/c dates. She reports she is out of town and she is not expected to return until Monday. CSW inquired about the disposition plan, SNF verses Home, she states she will take the patient home but she will not be back until Monday.   She requested call from MD. CSW sent MD secured chart.   Montie Louder, MSW, LCSW Clinical Social Worker    Expected Discharge Plan: Skilled Nursing Facility Barriers to Discharge: Insurance Authorization, SNF Pending bed offer               Expected Discharge Plan and Services In-house Referral: Clinical Social Work                                             Social Drivers of Health (SDOH) Interventions SDOH Screenings   Food Insecurity: No Food Insecurity (07/07/2024)  Recent Concern: Food Insecurity - Food Insecurity Present (06/29/2024)   Received from [facility]  Housing: High Risk (07/07/2024)  Transportation Needs: No Transportation Needs (07/07/2024)  Utilities: Not At Risk (07/07/2024)  Depression (PHQ2-9): Low Risk  (07/24/2021)  Recent Concern: Depression (PHQ2-9) - Medium Risk (05/25/2021)  Financial Resource Strain: High Risk (06/29/2024)   Received from [facility]  Physical Activity: Inactive (06/29/2024)   Received from [facility]  Social Connections: Socially Isolated (07/07/2024)  Stress: Stress  Concern Present (06/29/2024)   Received from [facility]  Tobacco Use: Low Risk  (07/06/2024)   Received from [facility]    Readmission Risk Interventions     No data to display

## 2024-07-09 NOTE — Progress Notes (Signed)
 PHARMACY - ANTICOAGULATION CONSULT NOTE  Pharmacy Consult for heparin  Indication: DVT, PE  Allergies  Allergen Reactions   Nsaids Other (See Comments)    Prone to bleeding   Aspirin  Other (See Comments)    Prone to bleeding OK to take low-dose EC but not aspirin  based medications.    Patient Measurements: Height: 5' (152.4 cm) Weight: 47.5 kg (104 lb 11.5 oz) IBW/kg (Calculated) : 45.5 HEPARIN  DW (KG): 47.5  Vital Signs: Temp: 98.4 F (36.9 C) (08/15 0412) Temp Source: Oral (08/15 0412) BP: 145/68 (08/15 0412) Pulse Rate: 91 (08/15 0412)  Labs: Recent Labs    07/06/24 1754 07/06/24 1754 07/06/24 1940 07/07/24 0241 07/07/24 1000 07/08/24 0341 07/09/24 0314  HGB 12.0  --   --  10.9*  --  9.6* 9.1*  HCT 40.1  --   --  35.9*  --  31.3* 30.0*  PLT 322  --   --  299  --  303 300  HEPARINUNFRC  --    < >  --  0.40 0.36 0.50 0.59  CREATININE 0.72  --   --  0.65  --  0.62  --   TROPONINIHS 15  --  16  --   --   --   --    < > = values in this interval not displayed.    Estimated Creatinine Clearance: 39.6 mL/min (by C-G formula based on SCr of 0.62 mg/dL).  Assessment: 17 YOF presenting from clinic with positive DVT, she is not on anticoagulation PTA, CT angio chest also with small RLL PE.    Heparin  level remains therapeutic at 0.59 on heparin  at 800 units/hr. Hgb and plts stable.   Goal of Therapy:  Heparin  level 0.3-0.7 units/ml Monitor platelets by anticoagulation protocol: Yes   Plan:  Continue heparin  gtt at 800 units/hr Daily heparin  level and CBC Monitor signs and symptoms of bleeding F/u longterm [facility] plans  Ileane Sando, PharmD PGY-1 Pharmacy Resident [facility]  07/09/2024 8:01 AM

## 2024-07-09 NOTE — Telephone Encounter (Signed)
 Patient Product/process development scientist completed.    The patient is insured through [facility]. Patient has Medicare and is not eligible for a copay card, but may be able to apply for patient assistance or Medicare RX Payment Plan (Patient Must reach out to their plan, if eligible for payment plan), if available.    Ran test claim for Eliquis  5 mg and the current 30 day co-pay is $40.00.  Ran test claim for Xarelto 20 mg and the current 30 day co-pay is $40.00.  This test claim was processed through [facility]- copay amounts may vary at other pharmacies due to pharmacy/plan contracts, or as the patient moves through the different stages of their insurance plan.     Reyes Sharps, CPHT Pharmacy Technician III Certified Patient Advocate [facility] Pharmacy Patient Advocate Team Direct Number: [phone]  Fax: [phone]

## 2024-07-09 NOTE — TOC Progression Note (Signed)
 Transition of Care [facility]) - Progression Note    Patient Details  Name: Haya Hemler MRN: [id number] Date of Birth: 1941-05-12  [facility] CM/SW Contact  Montie LOISE Louder, [place] Phone Number: 07/09/2024, 1:48 PM  Clinical Narrative:    Patient's daughter states she talked with [facility] and she can d/c there, once she has Therapist, occupational.  CSW spoke with [employer]- they have confirmed patient has met her deductible and her co-payment will be only be $50.00 per day and the family will not have to pay up front.    CSW sent message to CMA to start insurance auth. Pending #Navi Shara PI#[id number]   [facility] will continue to follow and assist with discharge planning.   Montie Louder, MSW, LCSW Clinical Social Worker    Expected Discharge Plan: Skilled Nursing Facility Barriers to Discharge: Insurance Authorization, SNF Pending bed offer               Expected Discharge Plan and Services In-house Referral: Clinical Social Work                                             Social Drivers of Health (SDOH) Interventions SDOH Screenings   Food Insecurity: No Food Insecurity (07/07/2024)  Recent Concern: Food Insecurity - Food Insecurity Present (06/29/2024)   Received from [facility]  Housing: High Risk (07/07/2024)  Transportation Needs: No Transportation Needs (07/07/2024)  Utilities: Not At Risk (07/07/2024)  Depression (PHQ2-9): Low Risk  (07/24/2021)  Recent Concern: Depression (PHQ2-9) - Medium Risk (05/25/2021)  Financial Resource Strain: High Risk (06/29/2024)   Received from [facility]  Physical Activity: Inactive (06/29/2024)   Received from [facility]  Social Connections: Socially Isolated (07/07/2024)  Stress: Stress Concern Present (06/29/2024)   Received from [facility]  Tobacco Use: Low Risk  (07/06/2024)   Received from [facility]    Readmission Risk Interventions     No data to display

## 2024-07-09 NOTE — Progress Notes (Signed)
 Mobility Specialist Progress Note:   07/09/24 1130  Mobility  Activity Ambulated with assistance  Level of Assistance Minimal assist, patient does 75% or more  Assistive Device Front wheel walker  Distance Ambulated (ft) 250 ft  Activity Response Tolerated well  Mobility Referral Yes  Mobility visit 1 Mobility  Mobility Specialist Start Time (ACUTE ONLY) 1130  Mobility Specialist Stop Time (ACUTE ONLY) 1145  Mobility Specialist Time Calculation (min) (ACUTE ONLY) 15 min   Pt eager for mobility session. Required minA to stand from recliner, CGA to ambulate. No LOB noted. Required cues for RW proximity, as pt walks with flexed posture. No c/o throughout. Back in chair with all needs met.   Therisa Rana Mobility Specialist Please contact via SecureChat or  Rehab office at [phone]

## 2024-07-09 NOTE — Progress Notes (Signed)
 PROGRESS NOTE Kristine [name]  FMW:[id number] DOB: [birth year] DOA: 07/06/2024 PCP: [name], MD  Brief Narrative/Hospital Course: The patient is a 83 yr old woman who carries a medical history significant for crest syndrome, GERD, coronary artery disease, Barrett's esophagus, anemia of chronic disease, essential hypertension, hyperlipidemia, ischemic cardiomyopathy, coronary artery disease, obstructive sleep apnea, osteoporosis, Raynaud's disease, diverticular disease, COPD and asthma who was recently admitted on July 28 with suspected GI bleed. She lives in [place]. She had diarrhea weakness and low blood pressure at the time. Patient also had NSTEMI back in July 10. Patient brought in today with swelling of the left lower extremity. It was markedly swollen warm and tender. She went to the pain clinic today reports that the swelling has been going on for about 5 days. Her PCP sent her there first. Doppler ultrasound was performed that showed extensive DVT of the left lower extremity so she was sent to the ER for further evaluation and treatment. In the ED further evaluation confirmed the DVT with CT venogram done showed extensive left-sided DVT from at least the confluence of the left calf veins to the compress of the iliac veins. Subsequent CT angiogram of the chest showed a small right lower lobe subsegmental pulmonary embolus. No right heart strain. Vascular surgery was consulted with plan for thrombectomy  and admitted.  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 She underwent an arteriogram and a thrombectomy. She tolerated the procedure well.   The patient is medically cleared for discharge. On 07/08/2024 the daughter apparently expressed to nursing that she had concerns about her mother's ability to be at home alone. For this reason her discharge was delayed so that she could be evaluated by PT/OT. They have recommended that she could go home with some assistance.    Plans were made for discharge today. However, the patient's daughter had not anticipated that she would need to be home before Monday. The patient's grandson is coming to [place] from [place] . He will arrive tonight, but will have to leave before the patient's daughter returns from [facility]. The daughter is seeking SNF placement for the patient. However, it seems unlikely that this will be accomplished before Monday. The patient will remain inpatient due to family's inability to care for the patient at home.  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 The patient underwent angiogram with extensive percutaneous mechanical thrombectomy and balloon venoplasty with vascular surgery on 07/07/2024. The patient has tolerated the procedure well. She is appropriate for discharge.   GERD: Continue PPI.   Hypertension CAD-S/P coronary artery stent placement Hyperlipidemia: No chest pain.  BP remains controlled.  Continue metoprolol , pravastatin , aspirin , heparin  as above.   Iron  deficiency anemia history: Hemoglobin stable continue iron  supplement.   COPD: Continue her Breo, bronchodilators   OSA: con CPAP   Cr(e)st syndrome Stable.    Mobility: Currently bedrest PT Orders:  PT Follow up Rec:     DVT prophylaxis:  Code Status:   Code Status: Full Code Family Communication: plan of care discussed with patient at bedside. Patient status is: Remains hospitalized because of severity of illness Level of care: Telemetry Cardiac  Data Reviewed: I have personally reviewed following labs and imaging studies ( see epic result tab) CBC: Recent Labs  Lab 07/06/24 1754 07/07/24 0241 07/08/24 0341 07/09/24 0314  WBC 16.3* 11.1* 10.2 9.3  HGB  12.0 10.9* 9.6* 9.1*  HCT 40.1 35.9* 31.3* 30.0*  MCV 90.5 89.3 88.2 88.2  PLT 322 299 303 300   CMP: Recent Labs  Lab 07/06/24 1754 07/07/24 0241 07/08/24 0341  NA 138 140 138  K 4.4 4.3 3.9  CL 103 106 105  CO2 24 24 25   GLUCOSE 94 103* 104*  BUN 10 10 7*  CREATININE 0.72 0.65 0.62  CALCIUM  9.2 8.8* 8.6*   GFR: Estimated Creatinine Clearance: 39.6 mL/min (by C-G formula based on SCr of 0.62 mg/dL). Recent Labs  Lab 07/07/24 0241  AST 16  ALT 11  ALKPHOS 79  BILITOT 0.6  PROT 5.1*  ALBUMIN 2.4*   No results for input(s): LIPASE, AMYLASE in the last 168 hours. No results for input(s): AMMONIA in the last 168 hours. Coagulation Profile: No results for input(s): INR, PROTIME in the last 168 hours. Unresulted Labs (From admission, onward)     Start     Ordered   07/07/24 0500  CBC  Daily,   R     Placed in And Linked Group   07/06/24 1740           Vitals:   07/09/24 1900 07/09/24 1938  BP: (!) 112/53 (!) 112/53  Pulse:  (!) 106  Resp: 20   Temp: 99 F (37.2 C)   SpO2:  93%   Exam:  Constitutional:  The patient is awake, alert, and oriented x 3. No acute distress. Eyes:  pupils and irises appear normal Normal lids and conjunctivae ENMT:  grossly normal hearing  Lips appear normal external ears, nose appear normal Oropharynx: mucosa, tongue,posterior pharynx appear normal Neck:  neck appears normal, no masses, normal ROM, supple no thyromegaly Respiratory:  No increased work of breathing. No wheezes, rales, or rhonchi No tactile fremitus Cardiovascular:  Regular rate and rhythm No murmurs, ectopy, or gallups. No lateral PMI. No thrills. Abdomen:  Abdomen is soft, non-tender, non-distended No hernias, masses, or organomegaly Normoactive bowel sounds.  Musculoskeletal:  No cyanosis, clubbing, or edema Skin:  No rashes, lesions, ulcers palpation of skin: no induration or nodules Neurologic:  CN 2-12 intact Sensation all 4  extremities intact Psychiatric:  Mental status Mood, affect appropriate Orientation to person, place, time  judgment and insight appear intact  Antimicrobials/Microbiology: Anti-infectives (From admission, onward)    None         Component Value Date/Time   SDES URINE, CLEAN CATCH 06/13/2024 1329   SPECREQUEST NONE 06/13/2024 1329   CULT (A) 06/13/2024 1329    <10,000 COLONIES/mL INSIGNIFICANT GROWTH Performed at [facility], [address] [place], [place] [address]    REPTSTATUS 06/15/2024 FINAL 06/13/2024 1329    Brigida Bureau, MD [facility] 07/09/2024, 7:42 PM

## 2024-07-09 NOTE — Progress Notes (Signed)
 PHARMACY - ANTICOAGULATION CONSULT NOTE  Pharmacy Consult for Eliquis  Indication: DVT, PE- May Turner syndrome   Allergies  Allergen Reactions   Nsaids Other (See Comments)    Prone to bleeding   Aspirin  Other (See Comments)    Prone to bleeding OK to take low-dose EC but not aspirin  based medications.    Patient Measurements: Height: 5' (152.4 cm) Weight: 47.5 kg (104 lb 11.5 oz) IBW/kg (Calculated) : 45.5 HEPARIN  DW (KG): 47.5  Vital Signs: Temp: 98.6 F (37 C) (08/15 1142) Temp Source: Oral (08/15 1142) BP: 115/62 (08/15 1142) Pulse Rate: 102 (08/15 1142)  Labs: Recent Labs    07/06/24 1754 07/06/24 1754 07/06/24 1940 07/07/24 0241 07/07/24 1000 07/08/24 0341 07/09/24 0314  HGB 12.0  --   --  10.9*  --  9.6* 9.1*  HCT 40.1  --   --  35.9*  --  31.3* 30.0*  PLT 322  --   --  299  --  303 300  HEPARINUNFRC  --    < >  --  0.40 0.36 0.50 0.59  CREATININE 0.72  --   --  0.65  --  0.62  --   TROPONINIHS 15  --  16  --   --   --   --    < > = values in this interval not displayed.    Estimated Creatinine Clearance: 39.6 mL/min (by C-G formula based on SCr of 0.62 mg/dL).  Assessment: 47 YOF presenting from clinic with positive DVT, she is not on anticoagulation PTA, CT angio chest also with small RLL PE.  Consult to pharmacy to switch from heparin  to Eliquis .  CBC stable.  Goal of Therapy:  Heparin  level 0.3-0.7 units/ml Monitor platelets by anticoagulation protocol: Yes   Plan:  Stop heparin  infusion Start Eliquis  10 mg BID x 7 days followed by 5 mg BID Monitor H&H, signs and symptoms of bleeding  Janell Mccune, PharmD PGY-1 Pharmacy Resident [facility]  07/09/2024 1:07 PM

## 2024-07-09 NOTE — Evaluation (Signed)
 Physical Therapy Evaluation Patient Details Name: Kristine [name] MRN: [id number] DOB: [birth year] Today's Date: 07/09/2024  History of Present Illness  Pt is 83 yo presenting to [facility] on 8/12 due to swelling of the LLE. Pt found to have L DVT and RLL PE. PMH: LHC on 7/14. CREST syndrome, CAD, STEMI s/p DES to OM1 in 2014, HLD, HTN, ischemic colitis, Barrett's esophagus, OSA on CPAP, and OA.  Clinical Impression  Pt is presenting below baseline level of functioning. Pt with multiple recent hospitalizations. Currently pt is Min A for bed mobility, Sit to stand, CGA for step pivot transfers and very short non-functional distance gait with RW. Pt is at a high risk for falls and attempted to sit prior to getting aligned with the chair and requires MiN A to navigate RW. Due to pt current functional status, home set up and available assistance at home recommending skilled physical therapy services < 3 hours/day in order to address strength, balance and functional mobility to decrease risk for falls, injury, immobility, skin break down and re-hospitalization.          If plan is discharge home, recommend the following: A little help with walking and/or transfers;Assistance with cooking/housework;Assist for transportation;Help with stairs or ramp for entrance   Can travel by private vehicle   Yes    Equipment Recommendations None recommended by PT     Functional Status Assessment Patient has had a recent decline in their functional status and demonstrates the ability to make significant improvements in function in a reasonable and predictable amount of time.     Precautions / Restrictions Precautions Precautions: Fall Restrictions Weight Bearing Restrictions Per Provider Order: No      Mobility  Bed Mobility Overal bed mobility: Needs Assistance Bed Mobility: Supine to Sit     Supine to sit: Min assist     General bed mobility comments: Min A for getting trunk to mid line and scooting to the  EOB.    Transfers Overall transfer level: Needs assistance Equipment used: Rolling walker (2 wheels), Straight cane Transfers: Sit to/from Stand, Bed to chair/wheelchair/BSC Sit to Stand: Contact guard assist, Min assist   Step pivot transfers: Min assist       General transfer comment: Min A for sit to stand due to weakness for initial momentum to standing and CGA for stability for transfer > BSC > recliner.    Ambulation/Gait Ambulation/Gait assistance: Contact guard assist Gait Distance (Feet): 20 Feet Assistive device: Rolling walker (2 wheels) Gait Pattern/deviations: Step-through pattern, Decreased stride length, Trunk flexed Gait velocity: decreased Gait velocity interpretation: <1.31 ft/sec, indicative of household ambulator   General Gait Details: cues for proximity to RW, forward gaze, activity pacing/energy conservation.     Balance Overall balance assessment: Needs assistance Sitting-balance support: No upper extremity supported, Feet supported Sitting balance-Leahy Scale: Good     Standing balance support: Bilateral upper extremity supported, During functional activity, Reliant on assistive device for balance, Single extremity supported Standing balance-Leahy Scale: Poor Standing balance comment: minA with sit to stand with RW and CGA for ambulation for safety.       Pertinent Vitals/Pain Pain Assessment Pain Assessment: No/denies pain    Home Living Family/patient expects to be discharged to:: Private residence Living Arrangements: Children;Other relatives Available Help at Discharge: Family;Available PRN/intermittently Type of Home: House Home Access: Stairs to enter Entrance Stairs-Rails: None Entrance Stairs-Number of Steps: 2   Home Layout: One level Home Equipment: Cane - single point;Shower seat  Prior Function Prior Level of Function : Independent/Modified Independent             Mobility Comments: Pt has been using RW since last  hospital admission ADLs Comments: Daughter assist with medications, pt ind with finances, sit to shower.     Extremity/Trunk Assessment   Upper Extremity Assessment Upper Extremity Assessment: Defer to OT evaluation;Generalized weakness    Lower Extremity Assessment Lower Extremity Assessment: Generalized weakness    Cervical / Trunk Assessment Cervical / Trunk Assessment: Kyphotic  Communication   Communication Communication: Impaired Factors Affecting Communication: Hearing impaired    Cognition Arousal: Alert Behavior During Therapy: WFL for tasks assessed/performed   PT - Cognitive impairments: Problem solving, Memory       PT - Cognition Comments: Improving cognition but can be slow to process and also HoH may affect comprehension, pt needing some redirection to task. Pleasantly cooperative. Following commands: Intact Following commands impaired: Follows one step commands with increased time, Follows multi-step commands inconsistently     Cueing Cueing Techniques: Verbal cues, Gestural cues            Assessment/Plan    PT Assessment Patient needs continued PT services  PT Problem List Decreased strength;Decreased activity tolerance;Decreased balance;Decreased mobility;Cardiopulmonary status limiting activity;Decreased knowledge of use of DME       PT Treatment Interventions DME instruction;Balance training;Gait training;Stair training;Functional mobility training;Therapeutic activities;Therapeutic exercise;Patient/family education    PT Goals (Current goals can be found in the Care Plan section)  Acute Rehab PT Goals Patient Stated Goal: get stronger PT Goal Formulation: With patient Time For Goal Achievement: 07/23/24 Potential to Achieve Goals: Good    Frequency Min 2X/week        AM-PAC PT 6 Clicks Mobility  Outcome Measure Help needed turning from your back to your side while in a flat bed without using bedrails?: A Little Help needed moving  from lying on your back to sitting on the side of a flat bed without using bedrails?: A Little Help needed moving to and from a bed to a chair (including a wheelchair)?: A Little Help needed standing up from a chair using your arms (e.g., wheelchair or bedside chair)?: A Little Help needed to walk in hospital room?: A Little Help needed climbing 3-5 steps with a railing? : A Lot 6 Click Score: 17    End of Session Equipment Utilized During Treatment: Gait belt Activity Tolerance: Patient tolerated treatment well Patient left: in chair;with call bell/phone within reach;with chair alarm set Nurse Communication: Mobility status PT Visit Diagnosis: Unsteadiness on feet (R26.81);Other abnormalities of gait and mobility (R26.89);Muscle weakness (generalized) (M62.81)    Time: 1000-1028 PT Time Calculation (min) (ACUTE ONLY): 28 min   Charges:   PT Evaluation $PT Eval Low Complexity: 1 Low PT Treatments $Therapeutic Activity: 8-22 mins PT General Charges $$ ACUTE PT VISIT: 1 Visit         Dorothyann Maier, DPT, CLT  Acute Rehabilitation Services Office: [phone] (Secure chat preferred)   Dorothyann VEAR Maier 07/09/2024, 10:31 AM

## 2024-07-10 DIAGNOSIS — I871 Compression of vein: Secondary | ICD-10-CM | POA: Diagnosis not present

## 2024-07-10 LAB — BASIC METABOLIC PANEL WITH GFR
Anion gap: 8 (ref 5–15)
BUN: <5 mg/dL — ABNORMAL LOW (ref 8–23)
CO2: 26 mmol/L (ref 22–32)
Calcium: 8.6 mg/dL — ABNORMAL LOW (ref 8.9–10.3)
Chloride: 103 mmol/L (ref 98–111)
Creatinine, Ser: 0.6 mg/dL (ref 0.44–1.00)
GFR, Estimated: >60 mL/min (ref >60)
Glucose, Bld: 110 mg/dL — ABNORMAL HIGH (ref 70–99)
Potassium: 3.7 mmol/L (ref 3.5–5.1)
Sodium: 137 mmol/L (ref 135–145)

## 2024-07-10 LAB — CBC
HCT: 31.7 % — ABNORMAL LOW (ref 36.0–46.0)
Hemoglobin: 9.6 g/dL — ABNORMAL LOW (ref 12.0–15.0)
MCH: 26.7 pg (ref 26.0–34.0)
MCHC: 30.3 g/dL (ref 30.0–36.0)
MCV: 88.3 fL (ref 80.0–100.0)
Platelets: 281 K/uL (ref 150–400)
RBC: 3.59 MIL/uL — ABNORMAL LOW (ref 3.87–5.11)
RDW: 18.7 % — ABNORMAL HIGH (ref 11.5–15.5)
WBC: 9.8 K/uL (ref 4.0–10.5)
nRBC: 0 % (ref 0.0–0.2)

## 2024-07-10 NOTE — Progress Notes (Signed)
 Mobility Specialist Progress Note:    07/10/24 0917  Mobility  Activity Ambulated with assistance  Level of Assistance Contact guard assist, steadying assist  Assistive Device Front wheel walker  Distance Ambulated (ft) 250 ft  Activity Response Tolerated well  Mobility Referral Yes  Mobility visit 1 Mobility  Mobility Specialist Start Time (ACUTE ONLY) [id number]  Mobility Specialist Stop Time (ACUTE ONLY) 0933  Mobility Specialist Time Calculation (min) (ACUTE ONLY) 16 min   Pt received in chair, agreeable to ambulate in hallway with RW. Tolerated well, took 2 standing rest breaks d/t fatigue. Returned pt to room, sitting up in chair with all needs met, call bell in reach.   Anaeli Cornwall Mobility Specialist Please contact via Special educational needs teacher or  Rehab office at [phone]

## 2024-07-10 NOTE — Progress Notes (Signed)
 PROGRESS NOTE Kristine [name]  FMW:[id number] DOB: 11-28-1940 DOA: 07/06/2024 PCP: [name], MD  Brief Narrative/Hospital Course: The patient is a 83 yr old woman who carries a medical history significant for crest syndrome, GERD, coronary artery disease, Barrett's esophagus, anemia of chronic disease, essential hypertension, hyperlipidemia, ischemic cardiomyopathy, coronary artery disease, obstructive sleep apnea, osteoporosis, Raynaud's disease, diverticular disease, COPD and asthma who was recently admitted on July 28 with suspected GI bleed. She [place]. She had diarrhea weakness and low blood pressure at the time. Patient also had NSTEMI back in July 10. Patient brought in today with swelling of the left lower extremity. It was markedly swollen warm and tender. She went to the pain clinic today reports that the swelling has been going on for about 5 days. Her PCP sent her there first. Doppler ultrasound was performed that showed extensive DVT of the left lower extremity so she was sent to the ER for further evaluation and treatment. In the ED further evaluation confirmed the DVT with CT venogram done showed extensive left-sided DVT from at least the confluence of the left calf veins to the compress of the iliac veins. Subsequent CT angiogram of the chest showed a small right lower lobe subsegmental pulmonary embolus. No right heart strain. Vascular surgery was consulted with plan for thrombectomy  and admitted.  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 She underwent an arteriogram and a thrombectomy. She tolerated the procedure well.   The patient is medically cleared for discharge. On 07/08/2024 the daughter apparently expressed to nursing that she had concerns about her mother's ability to be at home alone. For this reason her discharge was delayed so that she could be evaluated by PT/OT. They have recommended that she could go home with some assistance.    Plans were made for discharge today. However, the patient's daughter had not anticipated that she would need to be home before Monday. The patient's grandson is coming to [place] from [place] . He will arrive tonight, but will have to leave before the patient's daughter returns from [facility]. The daughter is seeking SNF placement for the patient. However, it seems unlikely that this will be accomplished before Monday. The patient will remain inpatient due to family's inability to care for the patient at home.  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 The patient underwent angiogram with extensive percutaneous mechanical thrombectomy and balloon venoplasty with vascular surgery on 07/07/2024. The patient has tolerated the procedure well. She is appropriate for discharge.   GERD: Continue PPI.   Hypertension CAD-S/P coronary artery stent placement Hyperlipidemia: No chest pain.  BP remains controlled.  Continue metoprolol , pravastatin , aspirin , heparin  as above.   Iron  deficiency anemia history: Hemoglobin stable continue iron  supplement.   COPD: Continue her Breo, bronchodilators   OSA: con CPAP   Cr(e)st syndrome Stable.    Mobility: Currently bedrest PT Orders:  PT Follow up Rec:     DVT prophylaxis:  Code Status:   Code Status: Full Code Family Communication: plan of care discussed with patient at bedside. Patient status is: Remains hospitalized because of severity of illness Level of care: Telemetry Cardiac  Data Reviewed: I have personally reviewed following labs and imaging studies ( see epic result tab) CBC: Recent Labs  Lab 07/06/24 1754 07/07/24 0241 07/08/24 0341 07/09/24 0314 07/10/24 0309  WBC 16.3* 11.1*  10.2 9.3 9.8  HGB 12.0 10.9* 9.6* 9.1* 9.6*  HCT 40.1 35.9* 31.3* 30.0* 31.7*  MCV 90.5 89.3 88.2 88.2 88.3  PLT 322 299 303 300 281   CMP: Recent Labs  Lab 07/06/24 1754 07/07/24 0241 07/08/24 0341 07/10/24 0309  NA 138 140 138 137  K 4.4 4.3 3.9 3.7  CL 103 106 105 103  CO2 24 24 25 26   GLUCOSE 94 103* 104* 110*  BUN 10 10 7* <5*  CREATININE 0.72 0.65 0.62 0.60  CALCIUM  9.2 8.8* 8.6* 8.6*   GFR: Estimated Creatinine Clearance: 39.6 mL/min (by C-G formula based on SCr of 0.6 mg/dL). Recent Labs  Lab 07/07/24 0241  AST 16  ALT 11  ALKPHOS 79  BILITOT 0.6  PROT 5.1*  ALBUMIN 2.4*   No results for input(s): LIPASE, AMYLASE in the last 168 hours. No results for input(s): AMMONIA in the last 168 hours. Coagulation Profile: No results for input(s): INR, PROTIME in the last 168 hours. Unresulted Labs (From admission, onward)     Start     Ordered   07/07/24 0500  CBC  Daily,   R     Placed in And Linked Group   07/06/24 1740           Vitals:   07/10/24 0810 07/10/24 1300  BP: 116/69 137/63  Pulse:  86  Resp: 19 17  Temp: 98.6 F (37 C) 97.6 F (36.4 C)  SpO2: 92% 94%   Exam:  Constitutional:  The patient is awake, alert, and oriented x 3. No acute distress. Eyes:  pupils and irises appear normal Normal lids and conjunctivae ENMT:  grossly normal hearing  Lips appear normal external ears, nose appear normal Oropharynx: mucosa, tongue,posterior pharynx appear normal Neck:  neck appears normal, no masses, normal ROM, supple no thyromegaly Respiratory:  No increased work of breathing. No wheezes, rales, or rhonchi No tactile fremitus Cardiovascular:  Regular rate and rhythm No murmurs, ectopy, or gallups. No lateral PMI. No thrills. Abdomen:  Abdomen is soft, non-tender, non-distended No hernias, masses, or organomegaly Normoactive bowel sounds.  Musculoskeletal:  No cyanosis, clubbing, or edema Skin:  No rashes, lesions,  ulcers palpation of skin: no induration or nodules Neurologic:  CN 2-12 intact Sensation all 4 extremities intact Psychiatric:  Mental status Mood, affect appropriate Orientation to person, place, time  judgment and insight appear intact  Antimicrobials/Microbiology: Anti-infectives (From admission, onward)    None         Component Value Date/Time   SDES URINE, CLEAN CATCH 06/13/2024 1329   SPECREQUEST NONE 06/13/2024 1329   CULT (A) 06/13/2024 1329    <10,000 COLONIES/mL INSIGNIFICANT GROWTH Performed at [facility], [address] [place], [place] [address]    REPTSTATUS 06/15/2024 FINAL 06/13/2024 1329    Brigida Bureau, MD [facility] 07/10/2024, 4:05 PM

## 2024-07-11 DIAGNOSIS — I871 Compression of vein: Secondary | ICD-10-CM | POA: Diagnosis not present

## 2024-07-11 LAB — TYPE AND SCREEN
ABO/RH(D): A POS
Antibody Screen: POSITIVE
Donor AG Type: NEGATIVE
Donor AG Type: NEGATIVE
Unit division: 0
Unit division: 0

## 2024-07-11 LAB — CBC
HCT: 31.8 % — ABNORMAL LOW (ref 36.0–46.0)
Hemoglobin: 9.6 g/dL — ABNORMAL LOW (ref 12.0–15.0)
MCH: 26.9 pg (ref 26.0–34.0)
MCHC: 30.2 g/dL (ref 30.0–36.0)
MCV: 89.1 fL (ref 80.0–100.0)
Platelets: 298 K/uL (ref 150–400)
RBC: 3.57 MIL/uL — ABNORMAL LOW (ref 3.87–5.11)
RDW: 18.8 % — ABNORMAL HIGH (ref 11.5–15.5)
WBC: 10.5 K/uL (ref 4.0–10.5)
nRBC: 0 % (ref 0.0–0.2)

## 2024-07-11 LAB — BPAM RBC
Blood Product Expiration Date: 202508292359
Blood Product Expiration Date: 202508292359
Unit Type and Rh: 6200
Unit Type and Rh: 6200

## 2024-07-11 NOTE — Plan of Care (Signed)

## 2024-07-11 NOTE — Progress Notes (Signed)
 PROGRESS NOTE Kristine [name]  FMW:[id number] DOB: 05/10/1941 DOA: 07/06/2024 PCP: [name], MD  Brief Narrative/Hospital Course: The patient is a 83 yr old woman who carries a medical history significant for crest syndrome, GERD, coronary artery disease, Barrett's esophagus, anemia of chronic disease, essential hypertension, hyperlipidemia, ischemic cardiomyopathy, coronary artery disease, obstructive sleep apnea, osteoporosis, Raynaud's disease, diverticular disease, COPD and asthma who was recently admitted on July 28 with suspected GI bleed. She lives in [place]. She had diarrhea weakness and low blood pressure at the time. Patient also had NSTEMI back in July 10. Patient brought in today with swelling of the left lower extremity. It was markedly swollen warm and tender. She went to the pain clinic today reports that the swelling has been going on for about 5 days. Her PCP sent her there first. Doppler ultrasound was performed that showed extensive DVT of the left lower extremity so she was sent to the ER for further evaluation and treatment. In the ED further evaluation confirmed the DVT with CT venogram done showed extensive left-sided DVT from at least the confluence of the left calf veins to the compress of the iliac veins. Subsequent CT angiogram of the chest showed a small right lower lobe subsegmental pulmonary embolus. No right heart strain. Vascular surgery was consulted with plan for thrombectomy  and admitted.  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 She underwent an arteriogram and a thrombectomy. She tolerated the procedure well.   The patient is medically cleared for discharge. On 07/08/2024 the daughter apparently expressed to nursing that she had concerns about her mother's ability to be at home alone. For this reason her discharge was delayed so that she could be evaluated by PT/OT. They have recommended that she could go home with some assistance.    Plans were made for discharge on 07/09/2024. However, the patient's daughter had not anticipated that she would need to be home from [facility] before Monday. The patient's grandson is coming to [place] from [place] . He will arrive tonight, but will have to leave before the patient's daughter returns from [facility]. The daughter is seeking SNF placement for the patient. However, it seems unlikely that this will be accomplished before Monday. The patient will remain inpatient due to family's inability to care for the patient at home.  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 The patient underwent angiogram with extensive percutaneous mechanical thrombectomy and balloon venoplasty with vascular surgery on 07/07/2024. The patient has tolerated the procedure well. She is appropriate for discharge.   GERD: Continue PPI.   Hypertension CAD-S/P coronary artery stent placement Hyperlipidemia: No chest pain.  BP remains controlled.  Continue metoprolol , pravastatin , aspirin , heparin  as above.   Iron  deficiency anemia history: Hemoglobin stable continue iron  supplement.   COPD: Continue her Breo, bronchodilators   OSA: con CPAP   Cr(e)st syndrome Stable.   Severe Protein Calorie Malnutrition   Mobility: Currently bedrest PT Orders:  PT Follow up Rec:     DVT prophylaxis:  Code Status:   Code Status: Full Code Family Communication: plan of care discussed with patient at bedside. Patient status is: Remains hospitalized because of severity of illness Level of care: Telemetry Cardiac  Data Reviewed: I have personally reviewed following labs and imaging  studies ( see epic result tab) CBC: Recent Labs  Lab 07/07/24 0241  07/08/24 0341 07/09/24 0314 07/10/24 0309 07/11/24 0311  WBC 11.1* 10.2 9.3 9.8 10.5  HGB 10.9* 9.6* 9.1* 9.6* 9.6*  HCT 35.9* 31.3* 30.0* 31.7* 31.8*  MCV 89.3 88.2 88.2 88.3 89.1  PLT 299 303 300 281 298   CMP: Recent Labs  Lab 07/06/24 1754 07/07/24 0241 07/08/24 0341 07/10/24 0309  NA 138 140 138 137  K 4.4 4.3 3.9 3.7  CL 103 106 105 103  CO2 24 24 25 26   GLUCOSE 94 103* 104* 110*  BUN 10 10 7* <5*  CREATININE 0.72 0.65 0.62 0.60  CALCIUM  9.2 8.8* 8.6* 8.6*   GFR: Estimated Creatinine Clearance: 39.6 mL/min (by C-G formula based on SCr of 0.6 mg/dL). Recent Labs  Lab 07/07/24 0241  AST 16  ALT 11  ALKPHOS 79  BILITOT 0.6  PROT 5.1*  ALBUMIN 2.4*   No results for input(s): LIPASE, AMYLASE in the last 168 hours. No results for input(s): AMMONIA in the last 168 hours. Coagulation Profile: No results for input(s): INR, PROTIME in the last 168 hours. Unresulted Labs (From admission, onward)     Start     Ordered   07/07/24 0500  CBC  Daily,   R     Placed in And Linked Group   07/06/24 1740           Vitals:   07/11/24 1043 07/11/24 1228  BP: (!) 109/56 (!) 132/95  Pulse:    Resp: 20 18  Temp: 98.7 F (37.1 C) 98.1 F (36.7 C)  SpO2:     Exam:  Constitutional:  The patient is awake, alert, and oriented x 3. No acute distress. Respiratory:  No increased work of breathing. No wheezes, rales, or rhonchi No tactile fremitus Cardiovascular:  Regular rate and rhythm No murmurs, ectopy, or gallups. No lateral PMI. No thrills. Abdomen:  Abdomen is soft, non-tender, non-distended No hernias, masses, or organomegaly Normoactive bowel sounds.  Musculoskeletal:  No cyanosis, clubbing, or edema Skin:  No rashes, lesions, ulcers palpation of skin: no induration or nodules Neurologic:  CN 2-12 intact Sensation all 4 extremities intact Psychiatric:  Mental status Mood, affect appropriate Orientation to person, place, time  judgment  and insight appear intact  Antimicrobials/Microbiology: Anti-infectives (From admission, onward)    None         Component Value Date/Time   SDES URINE, CLEAN CATCH 06/13/2024 1329   SPECREQUEST NONE 06/13/2024 1329   CULT (A) 06/13/2024 1329    <10,000 COLONIES/mL INSIGNIFICANT GROWTH Performed at [facility], [address] [place], [place] [address]    REPTSTATUS 06/15/2024 FINAL 06/13/2024 1329    Brigida Bureau, MD [facility] 07/11/2024, 4:14 PM

## 2024-07-11 NOTE — Progress Notes (Signed)
 Mobility Specialist Progress Note:   07/11/24 0928  Mobility  Activity Ambulated with assistance;Pivoted/transferred from bed to chair  Level of Assistance Minimal assist, patient does 75% or more  Assistive Device Front wheel walker  Distance Ambulated (ft) 15 ft  Activity Response Tolerated well  Mobility Referral Yes  Mobility visit 1 Mobility  Mobility Specialist Start Time (ACUTE ONLY) [id number]  Mobility Specialist Stop Time (ACUTE ONLY) [id number]  Mobility Specialist Time Calculation (min) (ACUTE ONLY) 7 min   Pt received in bed, agreeable to transfer B>C via MinA and RW. Tolerated well, VSS throughout. Ambulated around bed to chair, sitting up with all needs met. Pt declined further ambulating at this time. Left with call bell in reach.   Marshay Slates Mobility Specialist Please contact via Special educational needs teacher or  Rehab office at [phone]

## 2024-07-12 DIAGNOSIS — I82402 Acute embolism and thrombosis of unspecified deep veins of left lower extremity: Secondary | ICD-10-CM | POA: Diagnosis not present

## 2024-07-12 DIAGNOSIS — I2699 Other pulmonary embolism without acute cor pulmonale: Secondary | ICD-10-CM | POA: Diagnosis not present

## 2024-07-12 DIAGNOSIS — I2510 Atherosclerotic heart disease of native coronary artery without angina pectoris: Secondary | ICD-10-CM | POA: Diagnosis not present

## 2024-07-12 LAB — CBC
HCT: 32.8 % — ABNORMAL LOW (ref 36.0–46.0)
Hemoglobin: 9.9 g/dL — ABNORMAL LOW (ref 12.0–15.0)
MCH: 26.7 pg (ref 26.0–34.0)
MCHC: 30.2 g/dL (ref 30.0–36.0)
MCV: 88.4 fL (ref 80.0–100.0)
Platelets: 302 K/uL (ref 150–400)
RBC: 3.71 MIL/uL — ABNORMAL LOW (ref 3.87–5.11)
RDW: 19.1 % — ABNORMAL HIGH (ref 11.5–15.5)
WBC: 9.4 K/uL (ref 4.0–10.5)
nRBC: 0 % (ref 0.0–0.2)

## 2024-07-12 LAB — BASIC METABOLIC PANEL WITH GFR
Anion gap: 7 (ref 5–15)
BUN: 7 mg/dL — ABNORMAL LOW (ref 8–23)
CO2: 25 mmol/L (ref 22–32)
Calcium: 8.9 mg/dL (ref 8.9–10.3)
Chloride: 106 mmol/L (ref 98–111)
Creatinine, Ser: 0.66 mg/dL (ref 0.44–1.00)
GFR, Estimated: >60 mL/min (ref >60)
Glucose, Bld: 106 mg/dL — ABNORMAL HIGH (ref 70–99)
Potassium: 3.9 mmol/L (ref 3.5–5.1)
Sodium: 138 mmol/L (ref 135–145)

## 2024-07-12 MED ORDER — APIXABAN 5 MG PO TABS: ORAL_TABLET | ORAL | Status: AC

## 2024-07-12 NOTE — Plan of Care (Signed)

## 2024-07-12 NOTE — Progress Notes (Signed)
 Patient up in the recliner.

## 2024-07-12 NOTE — Progress Notes (Signed)
 Called [employer] and spoke with [place], RN who will be patient's nurse. Gave report on patient. Patient will be going to bed 201[address]B. She will be transported by [facility]

## 2024-07-12 NOTE — TOC Progression Note (Signed)
 Transition of Care [facility]) - Progression Note    Patient Details  Name: Kristine [name] MRN: [id number] Date of Birth: Apr 29, 1941  Transition of [facility]) CM/SW Contact  Montie LOISE Louder, [place] Phone Number: 07/12/2024, 11:26 AM  Clinical Narrative:     CSW received insurance authorization for [employer]  reference # [id number]  form 8/15 to 8/19   Montie Louder, MSW, LCSW Clinical Social Worker    Expected Discharge Plan: Skilled Nursing Facility Barriers to Discharge: Barriers Resolved               Expected Discharge Plan and Services In-house Referral: Clinical Social Work       Expected Discharge Date: 07/12/24                                     Social Drivers of Health (SDOH) Interventions SDOH Screenings   Food Insecurity: No Food Insecurity (07/07/2024)  Recent Concern: Food Insecurity - Food Insecurity Present (06/29/2024)   Received from [facility]  Housing: High Risk (07/07/2024)  Transportation Needs: No Transportation Needs (07/07/2024)  Utilities: Not At Risk (07/07/2024)  Depression (PHQ2-9): Low Risk  (07/24/2021)  Recent Concern: Depression (PHQ2-9) - Medium Risk (05/25/2021)  Financial Resource Strain: High Risk (06/29/2024)   Received from [facility]  Physical Activity: Inactive (06/29/2024)   Received from [facility]  Social Connections: Socially Isolated (07/07/2024)  Stress: Stress Concern Present (06/29/2024)   Received from [facility]  Tobacco Use: Low Risk  (07/06/2024)   Received from [facility]    Readmission Risk Interventions     No data to display

## 2024-07-12 NOTE — Care Management Important Message (Signed)
 Important Message  Patient Details  Name: Kristine [name] MRN: [id number] Date of Birth: [birth year]   Important Message Given:  Yes - Medicare IM     [name] 07/12/2024, 11:44 AM

## 2024-07-12 NOTE — Progress Notes (Signed)
 Mobility Specialist Progress Note:    07/12/24 0900  Mobility  Activity Ambulated with assistance  Level of Assistance Standby assist, set-up cues, supervision of patient - no hands on  Assistive Device Front wheel walker  Distance Ambulated (ft) 200 ft  Activity Response Tolerated well  Mobility Referral Yes  Mobility visit 1 Mobility  Mobility Specialist Start Time (ACUTE ONLY) 0900  Mobility Specialist Stop Time (ACUTE ONLY) 0911  Mobility Specialist Time Calculation (min) (ACUTE ONLY) 11 min   Pt received in chair, eager for mobility session. No physical assistance required. Ambulated in hallway with RW. Tolerated well, asx throughout. Denies SOB, dizziness or pain. Returned pt to room, sitting up in chair with all needs met.   Liadan Guizar Mobility Specialist Please contact via Special educational needs teacher or  Rehab office at [phone]

## 2024-07-12 NOTE — TOC Transition Note (Signed)
 Transition of Care [facility]) - Discharge Note   Patient Details  Name: Kristine [name] MRN: [id number] Date of Birth: 1941-04-01  [facility]) CM/SW Contact:  Montie LOISE Louder, LCSW Phone Number: 07/12/2024, 11:29 AM   Clinical Narrative:     Patient will Discharge to: [facility]  Discharge Date: 07/12/24 Family Notified: daughter Transport Ab:[facility]  Per MD patient is ready for discharge. RN, patient, and facility notified of discharge. Discharge Summary sent to facility. RN given number for [phone]. Ambulance transport requested for patient.   Clinical Social Worker signing off.  Montie Louder, MSW, LCSW Clinical Social Worker     Final next level of care: Skilled Nursing Facility Barriers to Discharge: Barriers Resolved   Patient Goals and CMS Choice            Discharge Placement              Patient chooses bed at: [facility] Patient to be transferred to facility by: [facility] Name of family member notified: daughter Patient and family notified of of transfer: 07/12/24  Discharge Plan and Services Additional resources added to the After Visit Summary for   In-house Referral: Clinical Social Work                                   Social Drivers of Health (SDOH) Interventions SDOH Screenings   Food Insecurity: No Food Insecurity (07/07/2024)  Recent Concern: Food Insecurity - Food Insecurity Present (06/29/2024)   Received from [facility]  Housing: High Risk (07/07/2024)  Transportation Needs: No Transportation Needs (07/07/2024)  Utilities: Not At Risk (07/07/2024)  Depression (PHQ2-9): Low Risk  (07/24/2021)  Recent Concern: Depression (PHQ2-9) - Medium Risk (05/25/2021)  Financial Resource Strain: High Risk (06/29/2024)   Received from [facility]  Physical Activity: Inactive (06/29/2024)   Received from [facility]  Social Connections: Socially Isolated (07/07/2024)  Stress: Stress Concern Present (06/29/2024)   Received from  [facility]  Tobacco Use: Low Risk  (07/06/2024)   Received from [facility]     Readmission Risk Interventions     No data to display

## 2024-07-12 NOTE — Discharge Summary (Signed)
 Physician Discharge Summary  Kristine [name] FMW:[id number] DOB: 12/23/1940 DOA: 07/06/2024  PCP: [name], MD  Admit date: 07/06/2024 Discharge date: 07/12/2024  Admitted From: Home Disposition: Skilled nursing facility  Recommendations for Outpatient Follow-up:  Follow up with PCP in 1-2 weeks Please obtain BMP/CBC in one week    Discharge Condition: Stable CODE STATUS: Full code Diet recommendation: Regular diet, nutritional supplements  Discharge summary: 83 year old with history of CREST syndrome, GERD, coronary artery disease, Barrett's esophagus, anemia of chronic disease, hypertension, hyperlipidemia, history of ischemic cardiomyopathy, COPD and asthma, recent non-STEMI brought from skilled nursing facility where she is as short-term rehab with swelling of the lower extremities mostly left lower extremity, warmth and tender.  Went to PCP, Doppler showed extensive DVT of the left lower extremity and was sent to the ER.  In the emergency room further evaluation confirmed the DVT with CT venogram showed extensive left-sided DVT from iliac veins to the left calf veins.  CT scan of the chest with small right lower lobe subsegmental PE.  Right lower lobe subsegmental PE Extensive left lower extremity DVT from iliac vein to the calf veins. Patient was admitted with heparin  infusion.  Underwent arteriogram and thrombectomy by vascular surgery.  Clinically improved.  Currently on Eliquis  day 4 of the loading dose.  With clinical improvement, she will continue 7 days of loading dose of Eliquis  then she will continue maintenance Eliquis  5 mg twice daily for at least 6 months now. Remains surgically stable as per surgery.  GERD: On PPI.  Continue.  Hypertension Coronary artery disease status post coronary stent, patient on metoprolol , pravastatin , aspirin .  She will continue aspirin  due to recent non-STEMI. Hyperlipidemia, on statin. COPD, stable on bronchodilators.   Medically stable for  discharge today.     Discharge Diagnoses:  Principal Problem:   Pulmonary embolism (HCC) Active Problems:   GERD (gastroesophageal reflux disease)   Hypertension   CAD (coronary artery disease), native coronary artery   Hyperlipidemia   Type 2 macular telangiectasis, right   OSA on CPAP   Cr(e)st syndrome (HCC)   S/P coronary artery stent placement   Iron  deficiency anemia due to chronic blood loss   Acute deep vein thrombosis (DVT) of left lower extremity (HCC)   Protein-calorie malnutrition, severe    Discharge Instructions  Discharge Instructions     Diet general   Complete by: As directed    Increase activity slowly   Complete by: As directed       Allergies as of 07/12/2024       Reactions   Nsaids Other (See Comments)   Prone to bleeding   Aspirin  Other (See Comments)   Prone to bleeding OK to take low-dose EC but not aspirin  based medications.        Medication List     TAKE these medications    acetaminophen  500 MG tablet Commonly known as: TYLENOL  Take 1,000 mg by mouth every 6 (six) hours as needed for headache or moderate pain (pain score 4-6).   albuterol  108 (90 Base) MCG/ACT inhaler Commonly known as: VENTOLIN  HFA Inhale 1 puff into the lungs every 4 (four) hours as needed for wheezing or shortness of breath.   apixaban  5 MG Tabs tablet Commonly known as: ELIQUIS  Take 2 tablets (10 mg total) by mouth 2 (two) times daily for 3 days, THEN 1 tablet (5 mg total) 2 (two) times daily. Start taking on: July 12, 2024   aspirin  EC 81 MG tablet Take 1 tablet (  81 mg total) by mouth daily. What changed: when to take this   b complex vitamins capsule Take 1 capsule by mouth in the morning.   bisacodyl 10 MG suppository Commonly known as: DULCOLAX Place 10 mg rectally as needed for moderate constipation.   famotidine  20 MG tablet Commonly known as: PEPCID  Take 40 mg by mouth at bedtime.   ferrous sulfate  325 (65 FE) MG tablet Take 1  tablet (325 mg total) by mouth daily with breakfast.   fluticasone  furoate-vilanterol 100-25 MCG/ACT Aepb Commonly known as: BREO ELLIPTA  Inhale 1 puff into the lungs daily. What changed: when to take this   ipratropium-albuterol  0.5-2.5 (3) MG/3ML Soln Commonly known as: DUONEB Take 3 mLs by nebulization every 6 (six) hours as needed.   MAGNESIUM  PO Take 1 tablet by mouth in the morning.   metoprolol  succinate 25 MG 24 hr tablet Commonly known as: Toprol  XL Take 0.5 tablets (12.5 mg total) by mouth daily. What changed: when to take this   Milk of Magnesia 1200 MG/15ML suspension Generic drug: magnesium  hydroxide Take 30 mLs by mouth as needed for mild constipation.   nitroGLYCERIN  0.4 MG SL tablet Commonly known as: NITROSTAT  Place 1 tablet (0.4 mg total) under the tongue every 5 (five) minutes x 3 doses as needed for chest pain.   NUTRITIONAL SUPPLEMENT PO Take 120 mLs by mouth in the morning and at bedtime. [employer]   OLIVE LEAF EXTRACT PO Take 1 capsule by mouth in the morning.   pantoprazole  40 MG tablet Commonly known as: PROTONIX  Take 1 tablet (40 mg total) by mouth 2 (two) times daily.   pravastatin  40 MG tablet Commonly known as: PRAVACHOL  Take 1 tablet (40 mg total) by mouth every evening.   Quercetin 250 MG Tabs Take 250 mg by mouth in the morning.   senna-docusate 8.6-50 MG tablet Commonly known as: Senokot-S Take 1 tablet by mouth 2 (two) times daily.   SODIUM PHOSPHATES RE Place 1 Insert rectally as needed. Enema   sucralfate  1 g tablet Commonly known as: CARAFATE  Take 1 tablet (1 g total) by mouth 4 (four) times daily -  with meals and at bedtime.        Allergies  Allergen Reactions   Nsaids Other (See Comments)    Prone to bleeding   Aspirin  Other (See Comments)    Prone to bleeding OK to take low-dose EC but not aspirin  based medications.    Consultations: Vascular surgery   Procedures/Studies: PERIPHERAL VASCULAR  CATHETERIZATION Result Date: 07/07/2024 Images from the original result were not included.   Patient name: Kristine [name]    MRN: [id number]        DOB: 1941-06-22            Sex: female  07/07/2024 Pre-operative Diagnosis: Extensive left lower extremity DVT extending into the left common iliac vein Post-operative diagnosis:  Same Surgeon:  Fonda FORBES Rim, MD Procedure Performed: 1.  Ultrasound-guided micropuncture access of the left popliteal vein in antegrade fashion 2.  Intravascular ultrasound of the popliteal vein, femoral vein, common femoral vein, external iliac vein, common iliac vein, inferior vena cava 3.  Percutaneous mechanical thrombectomy using 16 French lightening penumbra system popliteal vein, femoral vein, common femoral vein, external iliac vein, common iliac vein 4.  Diagnostic angiography of the left venous system extending from the popliteal vein through the inferior vena cava 5.  Balloon venoplasty 10 x 40 mm external iliac vein, common iliac vein 6.  Moderate sedation time 34  minutes, contrast volume 25 cc    Indications: Patient is an 83 year old female with recent history of deep venous thrombosis due to inactivity in her rehab facility.  Imaging demonstrated thrombus into the left common iliac vein.  There was question of May-Thurner.  After discussing the risks and benefits of percutaneous mechanical thrombectomy plus or minus stenting, Mahnoor elected to proceed.  Findings:  Acute thrombus extending from the popliteal vein into the left common iliac vein. 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  The patient was then placed prone on the table and prepped and draped in the usual sterile fashion.  A time out was called.  Ultrasound was used to evaluate the left popliteal vein.  This was accessed using an ultrasound-guided micropuncture needle.  Access  was then dilated to 8 [place].  Next, to keep contrast down, I used intravascular ultrasound to assess the left sided venous system for thrombus.  See results above.  I elected to intervene.  The patient was heparinized and a 16 [place] Gore dry seal sheath was placed in the left popliteal vein.  From this location, 34 French lightening penumbra system was utilized and thrombus removed from the popliteal vein, femoral vein, common femoral vein, external iliac vein, common iliac vein. Follow-up angiography demonstrated an excellent result.  There was minimal residual thrombus.  I elected to use balloon venoplasty and shows a 10 mm x 40 mm balloon after sizing the common iliac vein.  Follow-up angiography demonstrated recanalization of the venous system.  Repeat intravascular ultrasound demonstrated compression of the common iliac vein, however I chose not to stent as venous stents have a high propensity to occlude, and that her DVT was provoked.  Should she have another event, I would move forward with stenting.  Plan for continued anticoagulation.  The venotomy was managed with Monocryl suture.   Fonda FORBES Rim MD [facility] Office: [phone]    CT VENOGRAM ABD/PELVIS/LOWER EXT BILAT Result Date: 07/06/2024 CLINICAL DATA:  Known right pulmonary emboli and left lower extremity deep venous thrombosis. EXAM: CT VENOGRAM ABDOMEN AND PELVIS AND LOWER EXTREMITY BILATERAL TECHNIQUE: Venographic phase images of the abdomen, pelvis and lower extremities were obtained following the administration of intravenous contrast. Multiplanar reformats and maximum intensity projections were generated. RADIATION DOSE REDUCTION: This exam was performed according to the departmental dose-optimization program which includes automated exposure control, adjustment of the mA and/or kV according to patient size and/or use of iterative reconstruction technique. CONTRAST:  75mL OMNIPAQUE  IOHEXOL  350 MG/ML  SOLN COMPARISON:  CT from earlier in the same day. FINDINGS: Hepatobiliary: No focal liver abnormality is seen. No gallstones, gallbladder wall thickening, or biliary dilatation. Pancreas: Unremarkable. No pancreatic ductal dilatation or surrounding inflammatory changes. Spleen: Normal in size without focal abnormality. Adrenals/Urinary Tract: Adrenal glands are within normal limits. Kidneys demonstrate a normal enhancement pattern bilaterally. No renal calculi or obstructive changes are noted. The bladder is decompressed. Stomach/Bowel: Scattered fecal material is noted throughout the colon. Diverticular changes noted without evidence of diverticulitis. No obstructive changes are seen. The appendix is not well visualized and may have been surgically removed. Small bowel and stomach are unremarkable. Vascular/Lymphatic: Aortic atherosclerosis. No enlarged abdominal or pelvic lymph nodes Reproductive: Status post hysterectomy. No adnexal masses. Other: No abdominal wall hernia or abnormality.  No abdominopelvic ascites. Musculoskeletal: Degenerative changes of the thoracolumbar spine are noted. Mild scoliosis concave to the left is seen in the upper lumbar spine. No acute bony abnormality is seen. Extensive soft tissue swelling and edema is noted in the left leg. IVC: Within normal limits. Portal and mesenteric veins: Within normal limits. Bilateral iliac veins: Right iliac vein is widely patent. Left iliac vein demonstrates diffuse deep venous thrombosis involving the common, external and internal iliac venous systems. Right lower extremity: Right lower extremity shows no evidence of deep venous thrombosis. Left lower extremity: Left lower extremity shows extensive DVT extending from at least the confluence of the calf veins through the popliteal vein, superficial femoral vein and common femoral vein. Some profundus femoral clot is noted as well. Extensive edema is noted in the left lower leg consistent with the deep  venous thrombosis. IMPRESSION: Extensive left-sided DVT extending from at least the confluence of the left calf veins to the confluence of the iliac veins. Extensive edema is noted consistent with the thrombus. No involvement of the IVC is noted. Diverticulosis without diverticulitis. Electronically Signed   By: Oneil Devonshire M.D.   On: 07/06/2024 20:01   CT Angio Chest PE W and/or Wo Contrast Result Date: 07/06/2024 CLINICAL DATA:  Known left lower extremity deep venous thrombosis. EXAM: CT ANGIOGRAPHY CHEST WITH CONTRAST TECHNIQUE: Multidetector CT imaging of the chest was performed using the standard protocol during bolus administration of intravenous contrast. Multiplanar CT image reconstructions and MIPs were obtained to evaluate the vascular anatomy. RADIATION DOSE REDUCTION: This exam was performed according to the departmental dose-optimization program which includes automated exposure control, adjustment of the mA and/or kV according to patient size and/or use of iterative reconstruction technique. CONTRAST:  75mL OMNIPAQUE  IOHEXOL  350 MG/ML SOLN COMPARISON:  None Available. FINDINGS: Cardiovascular: Atherosclerotic calcifications of the thoracic aorta are noted. Aberrant right subclavian artery is noted. No aneurysmal dilatation or dissection is seen. The heart is within normal limits in size. The pulmonary artery shows segmental right lower lobe pulmonary emboli. No other significant emboli are seen. Coronary calcifications are noted. Mediastinum/Nodes: Thoracic inlet is within normal limits. No hilar or mediastinal adenopathy is noted. The esophagus shows severe dilatation with considerable ingested material within. Large hiatal hernia is noted as well. These findings are stable in appearance from the prior study. Lungs/Pleura: Moderate to large right-sided pleural effusion is noted with small left-sided pleural effusion. Compressive atelectasis in the lower lobes is noted bilaterally worse on the  right than the left. Some linear atelectasis is noted in the superior segment of the right lower lobe. Upper Abdomen: No acute abnormality. Musculoskeletal: No chest wall abnormality. No acute or significant osseous findings. Review of the MIP images confirms the above findings. IMPRESSION: Right lower lobe segmental pulmonary embolus without evidence of right heart strain. Significantly dilated esophagus stable in appearance from the prior exam. Bilateral pleural effusions right considerably greater than left with compressive atelectasis. Aortic Atherosclerosis (ICD10-I70.0). Electronically Signed   By: Oneil Devonshire M.D.   On: 07/06/2024 19:54   DG Chest Port 1 View Result Date: 06/13/2024 CLINICAL DATA:  Generalized weakness, hypotension EXAM: PORTABLE CHEST 1 VIEW COMPARISON:  06/06/2024 FINDINGS: Interval development of small left pleural effusion. There is marked gaseous distension of the esophagus in addition to a moderate hiatal hernia suggestive of a functional obstruction, better seen on CT examination of 06/08/2024. No pneumothorax. Cardiac size within normal limits. Pulmonary vascularity is normal. No acute bone abnormality. IMPRESSION: 1. Small left pleural  effusion. 2. Marked gaseous distension of the esophagus in addition to a moderate hiatal hernia suggestive of a functional obstruction. This would be better assessed with endoscopy Electronically Signed   By: Dorethia Molt M.D.   On: 06/13/2024 09:55   (Echo, Carotid, EGD, Colonoscopy, ERCP)    Subjective: Patient seen in the morning rounds.  She denies any complaints.  She is willing to get out of the hospital as soon as possible.  She tells me she is not wanting to go to the same place she was last week.   Discharge Exam: Vitals:   07/12/24 0722 07/12/24 0837  BP:  119/77  Pulse: 74 82  Resp: 18 18  Temp:  98.2 F (36.8 C)  SpO2: 96% 100%   Vitals:   07/11/24 1723 07/12/24 0431 07/12/24 0722 07/12/24 0837  BP: 116/73 134/73   119/77  Pulse: 87 72 74 82  Resp: 20 18 18 18   Temp: 98.4 F (36.9 C) 98.6 F (37 C)  98.2 F (36.8 C)  TempSrc: Oral Oral  Oral  SpO2: 93% 91% 96% 100%  Weight:      Height:        General: Pt is alert, awake, not in acute distress On room air.  Mostly alert awake and oriented to herself and family. Cardiovascular: RRR, S1/S2 +, no rubs, no gallops Respiratory: CTA bilaterally, no wheezing, no rhonchi Abdominal: Soft, NT, ND, bowel sounds + Extremities:  no cyanosis Left leg on compression socks, trace edema.    The results of significant diagnostics from this hospitalization (including imaging, microbiology, ancillary and laboratory) are listed below for reference.     Microbiology: No results found for this or any previous visit (from the past 240 hours).   Labs: BNP (last 3 results) Recent Labs    06/04/24 1831 06/13/24 0932  BNP 1,207.9* 541.3*   Basic Metabolic Panel: Recent Labs  Lab 07/06/24 1754 07/07/24 0241 07/08/24 0341 07/10/24 0309 07/12/24 0444  NA 138 140 138 137 138  K 4.4 4.3 3.9 3.7 3.9  CL 103 106 105 103 106  CO2 24 24 25 26 25   GLUCOSE 94 103* 104* 110* 106*  BUN 10 10 7* <5* 7*  CREATININE 0.72 0.65 0.62 0.60 0.66  CALCIUM  9.2 8.8* 8.6* 8.6* 8.9   Liver Function Tests: Recent Labs  Lab 07/07/24 0241  AST 16  ALT 11  ALKPHOS 79  BILITOT 0.6  PROT 5.1*  ALBUMIN 2.4*   No results for input(s): LIPASE, AMYLASE in the last 168 hours. No results for input(s): AMMONIA in the last 168 hours. CBC: Recent Labs  Lab 07/08/24 0341 07/09/24 0314 07/10/24 0309 07/11/24 0311 07/12/24 0444  WBC 10.2 9.3 9.8 10.5 9.4  HGB 9.6* 9.1* 9.6* 9.6* 9.9*  HCT 31.3* 30.0* 31.7* 31.8* 32.8*  MCV 88.2 88.2 88.3 89.1 88.4  PLT 303 300 281 298 302   Cardiac Enzymes: No results for input(s): CKTOTAL, CKMB, CKMBINDEX, TROPONINI in the last 168 hours. BNP: Invalid input(s): POCBNP CBG: No results for input(s): GLUCAP in the  last 168 hours. D-Dimer No results for input(s): DDIMER in the last 72 hours. Hgb A1c No results for input(s): HGBA1C in the last 72 hours. Lipid Profile No results for input(s): CHOL, HDL, LDLCALC, TRIG, CHOLHDL, LDLDIRECT in the last 72 hours. Thyroid  function studies No results for input(s): TSH, T4TOTAL, T3FREE, THYROIDAB in the last 72 hours.  Invalid input(s): FREET3 Anemia work up No results for input(s): VITAMINB12, FOLATE, FERRITIN, TIBC,  IRON , RETICCTPCT in the last 72 hours. Urinalysis    Component Value Date/Time   COLORURINE YELLOW 06/13/2024 1329   APPEARANCEUR HAZY (A) 06/13/2024 1329   LABSPEC 1.015 06/13/2024 1329   PHURINE 7.0 06/13/2024 1329   GLUCOSEU NEGATIVE 06/13/2024 1329   HGBUR NEGATIVE 06/13/2024 1329   BILIRUBINUR NEGATIVE 06/13/2024 1329   BILIRUBINUR negative 07/14/2021 1409   BILIRUBINUR neg 12/29/2014 1954   KETONESUR 5 (A) 06/13/2024 1329   PROTEINUR NEGATIVE 06/13/2024 1329   UROBILINOGEN 0.2 07/14/2021 1409   UROBILINOGEN 0.2 02/01/2013 1541   NITRITE NEGATIVE 06/13/2024 1329   LEUKOCYTESUR LARGE (A) 06/13/2024 1329   Sepsis Labs Recent Labs  Lab 07/09/24 0314 07/10/24 0309 07/11/24 0311 07/12/24 0444  WBC 9.3 9.8 10.5 9.4   Microbiology No results found for this or any previous visit (from the past 240 hours).   Time coordinating discharge: 35 minutes  SIGNED:   Renato Applebaum, MD  [facility] 07/12/2024, 10:36 AM

## 2024-07-12 NOTE — TOC Progression Note (Signed)
 Transition of Care [facility]) - Progression Note    Patient Details  Name: Kristine [name] MRN: [id number] Date of Birth: Mar 28, 1941  Transition of [facility]) CM/SW Contact  Montie LOISE Louder, [place] Phone Number: 07/12/2024, 10:07 AM  Clinical Narrative:     Called insurance - Insurance auth for SNF is  still pending- will send updated clinicals once patient has been seen by PT.  Montie Louder, MSW, LCSW Clinical Social Worker    Expected Discharge Plan: Skilled Nursing Facility Barriers to Discharge: Insurance Authorization, SNF Pending bed offer               Expected Discharge Plan and Services In-house Referral: Clinical Social Work                                             Social Drivers of Health (SDOH) Interventions SDOH Screenings   Food Insecurity: No Food Insecurity (07/07/2024)  Recent Concern: Food Insecurity - Food Insecurity Present (06/29/2024)   Received from [facility]  Housing: High Risk (07/07/2024)  Transportation Needs: No Transportation Needs (07/07/2024)  Utilities: Not At Risk (07/07/2024)  Depression (PHQ2-9): Low Risk  (07/24/2021)  Recent Concern: Depression (PHQ2-9) - Medium Risk (05/25/2021)  Financial Resource Strain: High Risk (06/29/2024)   Received from [facility]  Physical Activity: Inactive (06/29/2024)   Received from [facility]  Social Connections: Socially Isolated (07/07/2024)  Stress: Stress Concern Present (06/29/2024)   Received from [facility]  Tobacco Use: Low Risk  (07/06/2024)   Received from [facility]    Readmission Risk Interventions     No data to display

## 2024-07-15 ENCOUNTER — Other Ambulatory Visit: Payer: Self-pay | Admitting: Vascular Surgery

## 2024-07-15 DIAGNOSIS — I824Y2 Acute embolism and thrombosis of unspecified deep veins of left proximal lower extremity: Secondary | ICD-10-CM

## 2024-08-17 ENCOUNTER — Other Ambulatory Visit: Payer: Self-pay | Admitting: Cardiovascular Disease

## 2024-08-18 NOTE — Telephone Encounter (Signed)
 Pt of Dr. Wonda. This RX was prescribed by the Hospital. Does Dr. Wonda want to refill? Please advise.

## 2024-08-19 ENCOUNTER — Ambulatory Visit (HOSPITAL_COMMUNITY)
Admission: RE | Admit: 2024-08-19 | Discharge: 2024-08-19 | Disposition: A | Discharge status: Home / Self Care | Source: Ambulatory Visit | Attending: Vascular Surgery | Admitting: Vascular Surgery

## 2024-08-19 ENCOUNTER — Ambulatory Visit: Attending: Vascular Surgery | Admitting: Physician Assistant

## 2024-08-19 ENCOUNTER — Ambulatory Visit (HOSPITAL_BASED_OUTPATIENT_CLINIC_OR_DEPARTMENT_OTHER)
Admission: RE | Admit: 2024-08-19 | Discharge: 2024-08-19 | Disposition: A | Discharge status: Home / Self Care | Source: Ambulatory Visit | Attending: Vascular Surgery | Admitting: Vascular Surgery

## 2024-08-19 VITALS — BP 174/67 | HR 58 | Temp 97.8°F | Ht 60.0 in | Wt 103.0 lb

## 2024-08-19 DIAGNOSIS — I824Y2 Acute embolism and thrombosis of unspecified deep veins of left proximal lower extremity: Secondary | ICD-10-CM

## 2024-08-20 ENCOUNTER — Other Ambulatory Visit: Payer: Self-pay | Admitting: *Deleted

## 2024-08-20 DIAGNOSIS — I824Y2 Acute embolism and thrombosis of unspecified deep veins of left proximal lower extremity: Secondary | ICD-10-CM

## 2024-08-24 NOTE — Progress Notes (Signed)
 Office Note   History of Present Illness   Kristine [name] is a 83 y.o. (1941-07-04) female who presents for follow-up.  She recently underwent left common femoral vein, external iliac vein, common iliac vein, femoral vein and popliteal vein mechanical thrombectomy with iliac vein balloon venoplasty on 07/07/2024 by Dr. Lanis.  This was done for May-Thurner syndrome with extensive left lower extremity DVT.  She returns today for follow-up.  She says she is doing better at today's office visit.  She feels like her left lower extremity swelling is slowly improving.  She says that she elevates her left leg intermittently without the day.  She currently does not wear any compression stockings.  Overall she has been fairly immobile since her procedure.  She is tolerating her Eliquis  without issue.  Past Medical History:  Diagnosis Date   Anemia    Arthritis    Asthma    Barrett's esophagus    Stage B   CAD (coronary artery disease)    a. s/p STEMI 7/14 >> LHC: LM 20, mLAD 30-40, small Dx 100 (L-L collats), OM1 90, OM2 30-40, mRCA 50-60, Lat/AL akinesis, EF 35-40% >> PCI: Promus DES to OM1;  b. Myoview 3/16: diaph atten, no ischemia, EF 59% (intermediate risk b/c BP drop 152>>137, but recovery BP 162 (tech error) >> med Rx continued  c. cath 05/30/17 s/p FFR guided PCI with DES to circumflex/first OM    COPD exacerbation (HCC) 06/04/2024   COVID-19 virus infection    CREST variant of scleroderma (HCC)    Cystocele    Diverticulitis    Event monitor 04/2019   Event monitor 04/2019: Sinus rhythm; no atrial fibrillation/flutter; no bradycardic events; few PVCs and occasional ventricular runs up to 9 beats   GERD (gastroesophageal reflux disease)    History of blood transfusion    hx of being Anti-K positive   History of Doppler ultrasound    a. Carotid US  2/12: bilateral ICA < 50%   Hyperlipidemia    Hypertension    Ischemic cardiomyopathy    a. EF 35-40% by LHC at time of MI;  b.  Echo 7/14:  Mild focal basal septal hypertrophy, EF 50-55%, apical HK, grade 1 diastolic dysfunction, trivial AI    Ischemic colitis    Orthostatic hypotension    OSA on CPAP    Osteoporosis 12/2017   T score -2.6   Raynaud disease    Rectocele    mild    Past Surgical History:  Procedure Laterality Date   ABDOMINAL HYSTERECTOMY  1996   RSO for menorrhagia.   COLONOSCOPY N/A 06/16/2024   Procedure: COLONOSCOPY;  Surgeon: Shila Gustav GAILS, MD;  Location: [facility] ENDOSCOPY;  Service: Gastroenterology;  Laterality: N/A;   CORONARY ANGIOPLASTY WITH STENT PLACEMENT  06/21/2013   CORONARY PRESSURE/FFR STUDY N/A 05/30/2017   Procedure: Intravascular Pressure Wire/FFR Study;  Surgeon: Wonda Sharper, MD;  Location: [facility] INVASIVE CV LAB;  Service: Cardiovascular;  Laterality: N/A;   CORONARY STENT INTERVENTION N/A 05/30/2017   Procedure: Coronary Stent Intervention;  Surgeon: Wonda Sharper, MD;  Location: [facility] INVASIVE CV LAB;  Service: Cardiovascular;  Laterality: N/A;   ESOPHAGOGASTRODUODENOSCOPY N/A 06/16/2024   Procedure: EGD (ESOPHAGOGASTRODUODENOSCOPY);  Surgeon: Nandigam, Kavitha V, MD;  Location: [facility] ENDOSCOPY;  Service: Gastroenterology;  Laterality: N/A;   FOOT SURGERY  2013   x2   JOINT REPLACEMENT  9/10   rt total   LEFT HEART CATH  AND CORONARY ANGIOGRAPHY N/A 05/30/2017   Procedure: Left Heart Cath and Coronary Angiography;  Surgeon: Wonda Sharper, MD;  Location: [facility] INVASIVE CV LAB;  Service: Cardiovascular;  Laterality: N/A;   LEFT HEART CATH AND CORONARY ANGIOGRAPHY N/A 06/07/2024   Procedure: LEFT HEART CATH AND CORONARY ANGIOGRAPHY;  Surgeon: Mady Bruckner, MD;  Location: [facility] INVASIVE CV LAB;  Service: Cardiovascular;  Laterality: N/A;   LOWER EXTREMITY ANGIOGRAPHY Left 07/07/2024   Procedure: Lower Extremity Angiography;  Surgeon: Lanis Fonda BRAVO, MD;  Location: [facility] INVASIVE CV LAB;  Service: Cardiovascular;  Laterality: Left;   nissen fundoplication  2006   PERIPHERAL VASCULAR  THROMBECTOMY N/A 07/07/2024   Procedure: PERIPHERAL VASCULAR THROMBECTOMY;  Surgeon: Lanis Fonda BRAVO, MD;  Location: [facility] INVASIVE CV LAB;  Service: Cardiovascular;  Laterality: N/A;   ROTATOR CUFF REPAIR     TONSILLECTOMY  1950   TOTAL KNEE ARTHROPLASTY     right   TUBAL LIGATION  1980   VENOUS ANGIOPLASTY Left 07/07/2024   Procedure: VENOUS ANGIOPLASTY;  Surgeon: Lanis Fonda BRAVO, MD;  Location: [facility] INVASIVE CV LAB;  Service: Cardiovascular;  Laterality: Left;    Social History   Socioeconomic History   Marital status: Divorced    Spouse name: Not on file   Number of children: Not on file   Years of education: Not on file   Highest education level: Not on file  Occupational History   Not on file  Tobacco Use   Smoking status: Never   Smokeless tobacco: Never  Vaping Use   Vaping status: Never Used  Substance and Sexual Activity   Alcohol use: No    Alcohol/week: 0.0 standard drinks of alcohol   Drug use: No   Sexual activity: Never    Comment: 1st intercourse 40 yo-1 partner  Other Topics Concern   Not on file  Social History Narrative   Exercise dancing 3 x times weekly for 1-2 hours   Social Drivers of Health   Financial Resource Strain: High Risk (06/29/2024)   Received from [facility]   Overall Financial Resource Strain (CARDIA)    How hard is it for you to pay for the very basics like food, housing, medical care, and heating?: Hard  Food Insecurity: No Food Insecurity (07/07/2024)   Hunger Vital Sign    Worried About Running Out of Food in the Last Year: Never true    Ran Out of Food in the Last Year: Never true  Recent Concern: Food Insecurity - Food Insecurity Present (06/29/2024)   [facility]   Hunger Vital Sign    Within the past 12 months, you worried that your food would run out before you got the money to buy more.: Sometimes true    Within the past 12 months, the food you bought just didn't last and you didn't have money to get more.:  Patient declined  Transportation Needs: No Transportation Needs (07/07/2024)   PRAPARE - Administrator, Civil Service (Medical): No    Lack of Transportation (Non-Medical): No  Physical Activity: Inactive (06/29/2024)   Received from [facility]   Exercise Vital Sign    On average, how many days per week do you engage in moderate to strenuous exercise (like a brisk walk)?: 0 days    Minutes of Exercise per Session: Not on file  Stress: Stress Concern Present (06/29/2024)   Received from [facility] of Occupational Health - Occupational Stress Questionnaire    Do  you feel stress - tense, restless, nervous, or anxious, or unable to sleep at night because your mind is troubled all the time - these days?: To some extent  Social Connections: Socially Isolated (07/07/2024)   Social Connection and Isolation Panel    Frequency of Communication with Friends and Family: More than three times a week    Frequency of Social Gatherings with Friends and Family: More than three times a week    Attends Religious Services: Never    Database administrator or Organizations: No    Attends Banker Meetings: Never    Marital Status: Divorced  Catering manager Violence: Not At Risk (07/07/2024)   Humiliation, Afraid, Rape, and Kick questionnaire    Fear of Current or Ex-Partner: No    Emotionally Abused: No    Physically Abused: No    Sexually Abused: No    Family History  Problem Relation Age of Onset   Cancer Mother        Pancreatic Cancer   Hepatitis C Mother    Heart disease Father        congestive heart failure   Heart attack Father    Panic disorder Daughter    Hypertension Daughter    Asthma Maternal Grandmother    Emphysema Maternal Grandmother    Cancer Maternal Grandfather        lung   Stroke Neg Hx     Current Outpatient Medications  Medication Sig Dispense Refill   acetaminophen  (TYLENOL ) 500 MG tablet Take 1,000 mg by mouth every 6  (six) hours as needed for headache or moderate pain (pain score 4-6).     albuterol  (VENTOLIN  HFA) 108 (90 Base) MCG/ACT inhaler Inhale 1 puff into the lungs every 4 (four) hours as needed for wheezing or shortness of breath.     apixaban  (ELIQUIS ) 5 MG TABS tablet Take 2 tablets (10 mg total) by mouth 2 (two) times daily for 3 days, THEN 1 tablet (5 mg total) 2 (two) times daily.     aspirin  EC 81 MG EC tablet Take 1 tablet (81 mg total) by mouth daily. (Patient taking differently: Take 81 mg by mouth in the morning.)     b complex vitamins capsule Take 1 capsule by mouth in the morning.     bisacodyl (DULCOLAX) 10 MG suppository Place 10 mg rectally as needed for moderate constipation.     famotidine  (PEPCID ) 20 MG tablet Take 40 mg by mouth at bedtime.     ferrous sulfate  325 (65 FE) MG tablet Take 1 tablet (325 mg total) by mouth daily with breakfast.     fluticasone  furoate-vilanterol (BREO ELLIPTA ) 100-25 MCG/ACT AEPB Inhale 1 puff into the lungs daily. (Patient taking differently: Inhale 1 puff into the lungs in the morning.)     ipratropium-albuterol  (DUONEB) 0.5-2.5 (3) MG/3ML SOLN Take 3 mLs by nebulization every 6 (six) hours as needed.     magnesium  hydroxide (MILK OF MAGNESIA) 400 MG/5ML suspension Take 30 mLs by mouth as needed for mild constipation.     MAGNESIUM  PO Take 1 tablet by mouth in the morning.     metoprolol  succinate (TOPROL  XL) 25 MG 24 hr tablet Take 0.5 tablets (12.5 mg total) by mouth daily. (Patient taking differently: Take 12.5 mg by mouth in the morning.) 45 tablet 3   nitroGLYCERIN  (NITROSTAT ) 0.4 MG SL tablet Place 1 tablet (0.4 mg total) under the tongue every 5 (five) minutes x 3 doses as needed for chest pain.  25 tablet 3   Nutritional Supplements (NUTRITIONAL SUPPLEMENT PO) Take 120 mLs by mouth in the morning and at bedtime. [employer]     OLIVE LEAF EXTRACT PO Take 1 capsule by mouth in the morning.     pantoprazole  (PROTONIX ) 40 MG tablet Take 1 tablet (40 mg  total) by mouth 2 (two) times daily.     pravastatin  (PRAVACHOL ) 20 MG tablet TAKE ONE TABLET BY MOUTH EVERY EVENING 90 tablet 0   pravastatin  (PRAVACHOL ) 40 MG tablet Take 1 tablet (40 mg total) by mouth every evening.     Quercetin 250 MG TABS Take 250 mg by mouth in the morning.     senna-docusate (SENOKOT-S) 8.6-50 MG tablet Take 1 tablet by mouth 2 (two) times daily.     SODIUM PHOSPHATES RE Place 1 Insert rectally as needed. Enema     sucralfate  (CARAFATE ) 1 g tablet Take 1 tablet (1 g total) by mouth 4 (four) times daily -  with meals and at bedtime.     No current facility-administered medications for this visit.    Allergies  Allergen Reactions   Nsaids Other (See Comments)    Prone to bleeding   Aspirin  Other (See Comments)    Prone to bleeding OK to take low-dose EC but not aspirin  based medications.    REVIEW OF SYSTEMS (negative unless checked):   Cardiac:  []  Chest pain or chest pressure? []  Shortness of breath upon activity? []  Shortness of breath when lying flat? []  Irregular heart rhythm?  Vascular:  []  Pain in calf, thigh, or hip brought on by walking? []  Pain in feet at night that wakes you up from your sleep? []  Blood clot in your veins? [x]  Leg swelling?  Pulmonary:  []  Oxygen at home? []  Productive cough? []  Wheezing?  Neurologic:  []  Sudden weakness in arms or legs? []  Sudden numbness in arms or legs? []  Sudden onset of difficult speaking or slurred speech? []  Temporary loss of vision in one eye? []  Problems with dizziness?  Gastrointestinal:  []  Blood in stool? []  Vomited blood?  Genitourinary:  []  Burning when urinating? []  Blood in urine?  Psychiatric:  []  Major depression  Hematologic:  []  Bleeding problems? []  Problems with blood clotting?  Dermatologic:  []  Rashes or ulcers?  Constitutional:  []  Fever or chills?  Ear/Nose/Throat:  []  Change in hearing? []  Nose bleeds? []  Sore throat?  Musculoskeletal:  []  Back  pain? []  Joint pain? []  Muscle pain?   Physical Examination     Vitals:   08/19/24 0943  BP: (!) 174/67  Pulse: (!) 58  Temp: 97.8 F (36.6 C)  TempSrc: Temporal  Weight: 103 lb (46.7 kg)  Height: 5' (1.524 m)   Body mass index is 20.12 kg/m.  General:  WDWN in NAD; vital signs documented above Gait: Not observed HENT: WNL, normocephalic Pulmonary: normal non-labored breathing , without Rales, rhonchi,  wheezing Cardiac: regular Abdomen: soft, NT, no masses Skin: without rashes Vascular Exam/Pulses: Brisk DP/PT Doppler signals bilaterally Extremities: No edema of RLE.  Nonpitting edema of LLE  Musculoskeletal: no muscle wasting or atrophy  Neurologic: A&O X 3;  No focal weakness or paresthesias are detected Psychiatric:  The pt has Normal affect.  Non-invasive Vascular Imaging   LLE DVT Study (08/19/2024):  No evidence of acute DVT in the left lower extremity.  Age-indeterminate intramuscular thrombus in the left gastrocnemius veins  IVC/Iliac Vein Duplex (08/19/2024): Normal IVC, left common iliac vein, and left external iliac vein without  DVT   Medical Decision Making   Kristine [name] is a 83 y.o. female who presents for thrombectomy follow-up  Based on the patient's duplex, there is no evidence of acute DVT in the left lower extremity.  There is age-indeterminate thrombus in the left gastrocnemius veins, likely associated with her previous thrombotic event Duplex also confirms a normal IVC, left common iliac vein, and left external iliac vein without acute or chronic DVT The patient says that her left lower extremity swelling is slowly getting better after thrombectomy.  She currently does not use any compression.  She does elevate her legs at rest. On exam her feet are warm and well-perfused with brisk DP/PT Doppler signals.  She does appear to have improved, nonpitting edema of the left lower extremity.  She has no venous ulcerations or cellulitis She can continue  her Eliquis  at this time.  I have encouraged her to continue to elevate her legs regularly and wear compression to help with her swelling.  She can follow-up with our office in 6 months with repeat left lower extremity DVT study and IVC/iliac vein duplex.  If her duplex remains negative at that time and her swelling is improved, she can likely discontinue her Eliquis    Ahmed SHAUNNA Holster, PA-C [facility] Office: [phone]  08/24/2024, 12:12 PM  Clinic MD: Lanis

## 2024-08-28 NOTE — Progress Notes (Signed)
 Cardiology Office Note:    Date:  09/07/2024   ID:  Kristine [name], DOB 07/06/1941, MRN [id number]  PCP:  [name], MD  Cardiologist:  [name], MD     Referring MD: Ileen Rosaline NOVAK, NP   Chief Complaint: hospital follow-up of NSTEMI  History of Present Illness:    Kristine [name] is a 83 y.o. female with a history of CAD with STEMI in 05/2013 s/p DES to OM1 and then DES to LCX in 05/2017, ischemic cardiomyopathy with EF of 35-40% on LV gram at time of MI in 2014 but 50-55% on Echo, mild aortic insufficiency, frequent PVCs, recurrent syncope, hypertension, hyperlipidemia, recent extensive left DVT with small subsegmental PE in 06/2024 s/p thrombectomy of DVT, COPD, bronchiectasis,  obstructive sleep apnea, GERD with Barrett's esophagus, esophageal dysmotility in setting of CREST, achalasia, recent GI bleed in 05/2024 s/p multiple blood transfusions, iron  deficiency anemia, and Raynaud's disease who is followed by Dr. Fell and presents today for hospital follow-up of NSTEMI.      CAD - LHC in 05/2013 during admission for acute lateral STEMI showed 90% of OM1 with otherwise mild to moderate non-obstructive disease. S/p DES to OM1. EF 35-40% on LV gram.  - LHC in 05/2017 after abnormal stress test showed patent stent to OM1 and 70% stenosis of proximal LCX with otherwise mild to moderate disease. S/p DES to LCX.  - LHC on 06/07/2024 during an admission for NSTEMI showed widely patent stents to LCX and OM1 with otherwise only mild to moderate non-obstructive disease. Medically therapy recommended.   Ischemic Cardiomyopathy - Initially diagnosed in 05/2013 at time of STEMI. EF 35-40% on LV gram at that time but 50-55% on Echo.  - Echo in 10/2023 showed LVEF of 50-55% with normal wall motion and mild asymmetric LVH of the basal-septal segment, normal RV function, and no significant valvular disease.  - Echo on 06/07/2024 during admission for NSTEMI showed LVEF of 50-55% with hypokinesis of the  mid and distal lateral wall and mid anterolateral wall, normal RV function, and mild AI.    Aortic Insufficiency - Mild AI noted on Echo in 05/2024.    Recurrent Syncope - Episode of syncope in 02/2019 in setting of walking in a heavy coat while wearing a facemask. Monitor showed a few PVCs and a occasional ventricular runs up to 9 beats but no significant arrhythmias.  - Episode of syncope in 08/2023 in setting of walking out of her home early one morning. Orthostatics were negative at follow-up visits for this in 09/2023. Monitor in 09/2023 showed a few short runs of a wide complex tachycardia (VT vs SVT with aberrancy) with longest run being 20 seconds with average heart rate of 165 bpm as well as rare PVCs. Echo in 10/2023 shoed normal LV function and no significant valvular disease as above.  - Episode of vasovagal syncope during on 06/06/2024 during admission for NSTEMI. Occurred after she urinated on bedside commode and briefly bradyed down with heart rates to the high 20s.  - Episode of syncope on 06/15/2024 during admission for acute GI bleed. Occurred in setting of hypotension and acute blood loss anemia after after a black tarry stool. Hemoglobin had dropped to 5.7.      Details of cardiac history are listed in Narrative History above. Patient has had multiple admission over the last 3 months.   She  was admitted in 05/2024 for NSTEMI after presenting with shortness of breath. Echo showed LVEF of 50-55% with hypokinesis  of the mid and distal lateral wall and mid anterolateral wall, normal RV function, and mild AI. LHC showed widely patent stents to LCX and OM1 with otherwise only mild to moderate non-obstructive disease. Medical therapy was recommended. Cause of troponin elevation unclear. She did have an episode of vasovagal syncopal episode one morning that occurred when she was on the bedside commode. She urinated and then developed flushing, lightheadedness, and diaphoresis. Heart rate  dropped to 29 bpm and then she had loss of consciousness. She had not been eating or drinking well at time of the event and had been NPO most of the day so was given some IV fluids. Hospitalization was also complicated by acute on chronic anemia with hemoglobin dropping to 6.9. Iron  panel showed severe iron  deficiency. She was treated with 1 unit of PRBCs and plan was for outpatient IV iron  infusions.    She was readmitted 2 days after discharge for acute GI bleed after presenting with increased weakness and diarrhea with hypotension and an episode of near syncope.Hemoglobin dropped as low as 6.7  She was transfuse 1 unit of PRBCs. She had another syncopal episode on the evening of 7/22 after after having a black tarry stool. Hemoglobin had dropped to 5.7. Antihypertensives were held. She was given IV fluids and received 2 more units of PRBCs as well as IV iron . GI was consulted. EGD showed esophageal dilatation, fluid-filled esophagus, esophageal ulcers, and gastritis. Colonoscopy was not able to be completed due to poor prep. Aspirin  was initially held but she was told she could restart this on 06/21/2024.  She was most recently admitted in 06/2024 for extensive DVT of left lower extremity from iliac veins to the left calf veins. Chest CTA also showed a small right lower lobe subsegmental PE. She was started on IV Heparin  and then underwent thrombectomy by Vascular Surgery. She was subsequently started on Eliquis .   Patient presents today for follow-up. Here with her daughter. Overall, she is doing well from a cardiac standpoint. She denies any chest pain. She states her breathing is fine. Daughter states that patient's grandson said she was short of breath after getting out of the shower last night but patient states this does not happen often. No orthopnea or PND. She does have some lower extremity edema with right leg being larger than the left (DVT was in the left leg). PCP recently restarted her on a  fluid pill last week but patient and daughter do not know the name or dose of this. No palpitations. She has occasional lightheadedness first thing in the morning but this does not occur all the time. No syncope. No abnormal bleeding in urine or stools.   EKGs/Labs/Other Studies Reviewed:    The following studies were reviewed:  Monitor 10/12/2024 to 10/26/2024: Summary: The basic rhythm is normal sinus with an average heart rate of 69 bpm. There is no atrial fibrillation or flutter. Rare PVCs with an overall burden of less than 1%. There are a few runs of wide-complex tachycardia which could be VT or SVT with aberrancy. The longest run is 20 seconds with an average ventricular rate of 165 bpm. There is no evidence of high-grade AV block present.  _______________   Echocardiogram 06/07/2024: Impressions: 1. Left ventricular ejection fraction, by estimation, is 50 to 55%. The  left ventricle has low normal function. The left ventricle demonstrates  regional wall motion abnormalities (see scoring diagram/findings for  description). Left ventricular diastolic   parameters were normal. The average left  ventricular global longitudinal  strain is -14.3 %. The global longitudinal strain is abnormal.   2. Right ventricular systolic function is normal. The right ventricular  size is normal. There is normal pulmonary artery systolic pressure. The  estimated right ventricular systolic pressure is 21.0 mmHg.   3. The mitral valve is grossly normal. No evidence of mitral valve  regurgitation. No evidence of mitral stenosis.   4. The aortic valve is tricuspid. Aortic valve regurgitation is mild.  Aortic valve sclerosis is present, with no evidence of aortic valve  stenosis.   5. The inferior vena cava is normal in size with greater than 50%  respiratory variability, suggesting right atrial pressure of 3 mmHg.  _______________   Left Cardiac Catheterization 06/07/2024: Conclusions: Stable appearance  of coronary arteries compared with last catheterization in 2018; there is mild-moderate, non-obstructive coronary artery disease involving the mid/distal LAD, mid LCx, and proximal/mid LAD. Widely patent overlapping LCx/OM1 stents. Normal left ventricular filling pressure (LVEDP 10 mmHg).   Recommendations: Continue secondary prevention of coronary artery disease. Consider further evaluation for there potential causes of shortness of breath and elevated HS-TnI/BNP, including pulmonary embolism.   Diagnostic Dominance: Right  _______________  Left Lower Extremity Venous Ultrasound 08/19/2024: Summary:  RIGHT:  - No evidence of common femoral vein obstruction.    LEFT:  - No evidence of common femoral vein obstruction.  There is no evidence of DVT from the groin to knee. The distal PTVs and  pero veins are patent. The prox and mid calf vessels are not well  visualized due to edema.  Findings consistent with age indeterminate intramuscular thrombus  involving the left gastrocnemius veins. - No cystic structure found in the  popliteal fossa.  _______________  Left IVC/ Iliac Ultrasound 08/19/2024: Summary:  IVC/Iliac: Normal IVC, left CIV, and left EIV. There is no evidence of  DVT.    EKG:  EKG not ordered today.   Recent Labs: 10/13/2023: TSH 2.190 06/13/2024: B Natriuretic Peptide 541.3 06/18/2024: Magnesium  1.8 07/07/2024: ALT 11 07/12/2024: BUN 7; Creatinine, Ser 0.66; Hemoglobin 9.9; Platelets 302; Potassium 3.9; Sodium 138  Recent Lipid Panel    Component Value Date/Time   CHOL 161 06/07/2024 0513   CHOL 152 03/28/2022 1007   CHOL 153 07/29/2014 0926   TRIG 136 06/07/2024 0513   TRIG 112 07/29/2014 0926   HDL 47 06/07/2024 0513   HDL 59 03/28/2022 1007   HDL 49 07/29/2014 0926   CHOLHDL 3.4 06/07/2024 0513   VLDL 27 06/07/2024 0513   LDLCALC 87 06/07/2024 0513   LDLCALC 74 03/28/2022 1007   LDLCALC 90 05/24/2021 0000   LDLCALC 82 07/29/2014 0926    Physical  Exam:    Vital Signs: BP (!) 148/75   Pulse 86   Ht 5' (1.524 m)   Wt 104 lb (47.2 kg)   LMP 11/25/1994   SpO2 96%   BMI 20.31 kg/m     Wt Readings from Last 3 Encounters:  09/07/24 104 lb (47.2 kg)  08/19/24 103 lb (46.7 kg)  07/06/24 104 lb 11.5 oz (47.5 kg)     General: 83 y.o. thin Caucasian female in no acute distress. HEENT: Normocephalic and atraumatic. Sclera clear.  Neck: Supple. No carotid bruits. No JVD. Heart: RRR. Distinct S1 and S2. No murmurs, gallops, or rubs.  Lungs: No increased work of breathing. Clear to ausculation bilaterally. No wheezes, rhonchi, or rales.  Extremities: Mild (trace to 1+) pitting edema of bilateral lower extremities (right > left).  Skin: Warm and dry. Neuro: No focal deficits. Psych: Normal affect. Responds appropriately.   Assessment:    1. Coronary artery disease involving native coronary artery of native heart without angina pectoris   2. Ischemic cardiomyopathy   3. Frequent PVCs   4. Aortic valve insufficiency, etiology of cardiac valve disease unspecified   5. History of syncope   6. Primary hypertension   7. Hyperlipidemia, unspecified hyperlipidemia type   8. Deep vein thrombosis (DVT) of proximal lower extremity, unspecified chronicity, unspecified laterality (HCC)   9. Pulmonary embolism without acute cor pulmonale, unspecified chronicity, unspecified pulmonary embolism type (HCC)     Plan:    CAD  S/p DES to OM1 in 05/2013 in setting of NSTEMI and DES to LCX in 05/2017. She was recently admitted in 05/2024 with NSTEMI. LHC showed patent stents and otherwise only mild disease. Unclear cause of troponin elevation.  - No chest pain.  - Continue Toprol -XL 12.5mg  daily.  - Discussed stopping Aspirin  now on Eliquis  for DVT/ PE and has a recent history of GI bleeding. Can restart Aspirin  if/ when Eliquis  is stopped. Patient seemed a little hesitant about this so will check CBC today but this will likely still be my  recommendation. - Continue Pravastatin  40mg  daily.    Ischemic Cardiomyopathy LVEF 35-40 on LV gram at time of NSTEMI in 05/2013 but 50-55% on Echo that same admission. Most recent Echo in 05/2024 showed LVEF of 50-55% with hypokinesis of the mid and distal lateral wall and mid anterolateral wall, normal RV function, and mild AI.  - She has mild lower extremity on exam. Right leg is worse than left which is interesting given recent DVT was in left leg. Otherwise, euvolemic on exam.  - Patient states PCP recently restarted her on fluid pill to help with her lower extremity edema but she does not know what this is. Asked patient/ daughter to go home and look at medications and then call us  with name and dose of this. - Continue Toprol -XL 12.5mg  daily.  - Will recheck BMET.   Frequent PVCs She was noted to have frequent PVCs and short runs of tachycardia on telemetry during admission in 05/2024.  -No ectopy noted on exam today.  - Continue Toprol -XL 12.5mg  daily.    Mild Aortic Insufficiency Noted on recent Echo in 05/2024.  - Can continue routine monitoring with serial Echos per guidelines.    History of Syncope History of recurrent syncope as described above. Monitor in 09/2023 after a syncopal episode the month prior showed a few short runs of a wide complex tachycardia (VT vs SVT with aberrancy) with longest run being 20 seconds with average heart rate of 165 bpm as well as rare PVCs. Echo in 05/2024 showed normal LV function with no significant valvular disease. She had 2 more syncopal episodes during recent hospitalizations in 05/2024. One sounds clearly vasovagal where she bradyed down with heart rate of 29 while urinating on bedside commode. The other occurred in setting of hypotension and acute blood loss anemia/ acute GI bleed.  - No recurrent syncope since recent admission.    Hypertension History of hypertension; however, she had hypotension during recent admission in 05/2024 in setting  of acute GI bleed.  - BP initially mildly elevated at 148/75 but improved to 124/62 on my personal recheck at the end of visit. - Continue low dose Toprol -XL as above. - Asked patient to continue to monitor BP at home and let us  know if consistently >130/80.  Hyperlipidemia Lipid panel in 05/2024: Total Cholesterol 161, Triglycerides 136, HDL 47, LDL 87. Lipoprotein (a) <8.4. LDL goal <55.  - Pravastatin  was increased to 40mg  daily during recent admission. Daughter states she is still only taking 20mg  daily. Will increase to 40mg  daily.  - Can order repeat lipid panel and LFTs at follow-up visit.    Recent DVT/ PE Patient was admitted in 06/2024 with extensive DVT of left lower extremity from iliac veins to the left calf veins. Chest CTA also showed a small right lower lobe subsegmental PE. She underwent thrombectomy of DVT by Vascular Surgery. Repeat dopplers on 08/19/2024 showed no evidence of DVT but did show an age indeterminate intrasmuscular thrombus involving the left gastrocnemius veins.  - Continue Eliquis  5mg  twice daily.  - Will defer duration of Eliquis  to Vascular Surgery/ PCP.  - Will repeat CBC to make sure hemoglobin stable after starting Eliquis  given recent history of acute on chronic anemia/ GI bleed  Disposition: Follow up in 3 months.   Signed, Aline FORBES Door, PA-C  09/07/2024 12:34 PM    [facility]

## 2024-09-07 ENCOUNTER — Ambulatory Visit: Attending: Student | Admitting: Student

## 2024-09-07 ENCOUNTER — Encounter: Payer: Self-pay | Admitting: Student

## 2024-09-07 VITALS — BP 148/75 | HR 86 | Ht 60.0 in | Wt 104.0 lb

## 2024-09-07 DIAGNOSIS — I10 Essential (primary) hypertension: Secondary | ICD-10-CM

## 2024-09-07 DIAGNOSIS — I255 Ischemic cardiomyopathy: Secondary | ICD-10-CM

## 2024-09-07 DIAGNOSIS — I2510 Atherosclerotic heart disease of native coronary artery without angina pectoris: Secondary | ICD-10-CM

## 2024-09-07 DIAGNOSIS — I351 Nonrheumatic aortic (valve) insufficiency: Secondary | ICD-10-CM | POA: Diagnosis not present

## 2024-09-07 DIAGNOSIS — I493 Ventricular premature depolarization: Secondary | ICD-10-CM

## 2024-09-07 DIAGNOSIS — I824Y9 Acute embolism and thrombosis of unspecified deep veins of unspecified proximal lower extremity: Secondary | ICD-10-CM

## 2024-09-07 DIAGNOSIS — I2699 Other pulmonary embolism without acute cor pulmonale: Secondary | ICD-10-CM

## 2024-09-07 DIAGNOSIS — E785 Hyperlipidemia, unspecified: Secondary | ICD-10-CM

## 2024-09-07 DIAGNOSIS — Z87898 Personal history of other specified conditions: Secondary | ICD-10-CM

## 2024-09-07 MED ORDER — PRAVASTATIN SODIUM 40 MG PO TABS: 40.0000 mg | ORAL_TABLET | Freq: Every evening | ORAL | 3 refills | Status: DC

## 2024-09-07 NOTE — Patient Instructions (Signed)
   Medication Instructions:  Please call the office to confirm which fluid medication is being used .  Continue the Pravastatin  40mg . A refill has been sent to your pharmacy.  Monitor your blood pressure. If readings are consistently over 130/80, please call the office or send mychart message.  *If you need a refill on your cardiac medications before your next appointment, please call your pharmacy*   Lab Work: CBC, BMET If you have labs (blood work) drawn today and your tests are completely normal, you will receive your results only by: MyChart Message (if you have MyChart) OR A paper copy in the mail If you have any lab test that is abnormal or we need to change your treatment, we will call you to review the results.   Testing/Procedures: No procedures were ordered during today's visit.   Your next appointment:   3 month(s)   Provider:   Aline Door, PA-C           Follow-Up: At [facility], you and your health needs are our priority.  As part of our continuing mission to provide you with exceptional heart care, we have created designated Provider Care Teams.  These Care Teams include your primary Cardiologist (physician) and Advanced Practice Providers (APPs -  Physician Assistants and Nurse Practitioners) who all work together to provide you with the care you need, when you need it. We recommend signing up for the patient portal called MyChart.  Sign up information is provided on this After Visit Summary.  MyChart is used to connect with patients for Virtual Visits (Telemedicine).  Patients are able to view lab/test results, encounter notes, upcoming appointments, etc.  Non-urgent messages can be sent to your provider as well.   To learn more about what you can do with MyChart, go to [email or web address].

## 2024-09-08 LAB — CBC
Hematocrit: 34.1 % (ref 34.0–46.6)
Hemoglobin: 10.3 g/dL — ABNORMAL LOW (ref 11.1–15.9)
MCH: 26 pg — ABNORMAL LOW (ref 26.6–33.0)
MCHC: 30.2 g/dL — ABNORMAL LOW (ref 31.5–35.7)
MCV: 86 fL (ref 79–97)
Platelets: 213 x10E3/uL (ref 150–450)
RBC: 3.96 x10E6/uL (ref 3.77–5.28)
RDW: 15 % (ref 11.7–15.4)
WBC: 6.8 x10E3/uL (ref 3.4–10.8)

## 2024-09-08 LAB — BASIC METABOLIC PANEL WITH GFR
BUN/Creatinine Ratio: 16 (ref 12–28)
BUN: 10 mg/dL (ref 8–27)
CO2: 24 mmol/L (ref 20–29)
Calcium: 9.7 mg/dL (ref 8.7–10.3)
Chloride: 106 mmol/L (ref 96–106)
Creatinine, Ser: 0.63 mg/dL (ref 0.57–1.00)
Glucose: 82 mg/dL (ref 70–99)
Potassium: 4.5 mmol/L (ref 3.5–5.2)
Sodium: 144 mmol/L (ref 134–144)
eGFR: 89 mL/min/1.73 (ref >59)

## 2024-09-10 ENCOUNTER — Ambulatory Visit: Payer: Self-pay | Admitting: Student

## 2024-11-16 NOTE — Progress Notes (Signed)
 "  Cardiology Office Note:    Date:  11/29/2024   ID:  Kristine [name], DOB 1941/10/20, MRN [id number]  PCP:  [name], MD  Cardiologist:  [name], MD     Referring MD: [name], MD   Chief Complaint: follow-up of CAD and recurrent syncope  History of Present Illness:    Kristine [name] is a 83 y.o. female with a history of CAD with STEMI in 05/2013 s/p DES to OM1 and then DES to LCX in 05/2017, ischemic cardiomyopathy with EF of 35-40% on LV gram at time of MI in 2014 but 50-55% on Echo in 05/2024, mild aortic insufficiency, frequent PVCs, recurrent syncope, hypertension, hyperlipidemia, recent extensive left DVT with small subsegmental PE in 06/2024 s/p thrombectomy of DVT, COPD, bronchiectasis,  obstructive sleep apnea, GERD with Barrett's esophagus, esophageal dysmotility in setting of CREST, achalasia, recent GI bleed in 05/2024 s/p multiple blood transfusions, iron  deficiency anemia, and Raynaud's disease who is followed by Dr. Fell and presents today for routine follow-up.      CAD - LHC in 05/2013 during admission for acute lateral STEMI showed 90% of OM1 with otherwise mild to moderate non-obstructive disease. S/p DES to OM1. EF 35-40% on LV gram.  - LHC in 05/2017 after abnormal stress test showed patent stent to OM1 and 70% stenosis of proximal LCX with otherwise mild to moderate disease. S/p DES to LCX.  - LHC on 06/07/2024 during an admission for NSTEMI showed widely patent stents to LCX and OM1 with otherwise only mild to moderate non-obstructive disease. Medically therapy recommended.    Ischemic Cardiomyopathy - Initially diagnosed in 05/2013 at time of STEMI. EF 35-40% on LV gram at that time but 50-55% on Echo.  - Echo in 10/2023 showed LVEF of 50-55% with normal wall motion and mild asymmetric LVH of the basal-septal segment, normal RV function, and no significant valvular disease.  - Echo on 06/07/2024 during admission for NSTEMI showed LVEF of 50-55% with hypokinesis of the  mid and distal lateral wall and mid anterolateral wall, normal RV function, and mild AI.    Aortic Insufficiency - Mild AI noted on Echo in 05/2024.    Recurrent Syncope - Episode of syncope in 02/2019 in setting of walking in a heavy coat while wearing a facemask. Monitor showed a few PVCs and a occasional ventricular runs up to 9 beats but no significant arrhythmias.  - Episode of syncope in 08/2023 in setting of walking out of her home early one morning. Orthostatics were negative at follow-up visits for this in 09/2023. Monitor in 09/2023 showed a few short runs of a wide complex tachycardia (VT vs SVT with aberrancy) with longest run being 20 seconds with average heart rate of 165 bpm as well as rare PVCs. Echo in 10/2023 shoed normal LV function and no significant valvular disease as above.  - Episode of vasovagal syncope during on 06/06/2024 during admission for NSTEMI. Occurred after she urinated on bedside commode and briefly bradyed down with heart rates to the high 20s.  - Episode of syncope on 06/15/2024 during admission for acute GI bleed. Occurred in setting of hypotension and acute blood loss anemia after after a black tarry stool. Hemoglobin had dropped to 5.7.      Details of cardiac history are listed in Narrative History above. Patient has had multiple admission over the last 3 months.   She had multiple admission this past summer. She was admitted in 05/2024 for NSTEMI after presenting with shortness  of breath. Echo showed LVEF of 50-55% with hypokinesis of the mid and distal lateral wall and mid anterolateral wall, normal RV function, and mild AI. LHC showed widely patent stents to LCX and OM1 with otherwise only mild to moderate non-obstructive disease. Medical therapy was recommended. Cause of troponin elevation unclear. She was noted to have one episode of vasovagal syncope during admission while on telemetry. Hospitalization was also complicated by by acute on chronic anemia with  hemoglobin dropping to 6.9. Iron  panel showed severe iron  deficiency. She was treated with 1 unit of PRBCs and plan was for outpatient IV iron  infusions. She was admitted 2 days after this for acute GI bleed after presenting with increased weakness and diarrhea with hypotension and an episode of near syncope.Hemoglobin dropped as low as 6.7  She was transfused 1 unit of PRBCs. She had another episode of syncope during this admission that occurred after having a black tarry stool with hemoglobin dropping got 5.7. Antihypertensives were held. She was given IV fluids and received 2 more units of PRBCs as well as IV iron . GI was consulted. EGD showed esophageal dilatation, fluid-filled esophagus, esophageal ulcers, and gastritis. Colonoscopy was not able to be completed due to poor prep. She was then admitted in 06/2024 for extensive DVT of left lower extremity from iliac veins to the left calf veins. Chest CTA also showed a small right lower lobe subsegmental PE. She was started on IV Heparin  and then underwent thrombectomy by Vascular Surgery. She was subsequently started on Eliquis .   She was last seen by me in 08/2024 at which time she was doing well overall from a cardiac standpoint.   Patient presents today for follow-up. Here with her daughter. She is doing well from a cardiac standpoint.  She reports very mild lightheadedness/dizziness first thing in the morning but this improves quickly.  No significant symptoms.  No recurrent syncope.  Daughter states patient also had concerns about discoloration of bilateral feet.  She has purple discoloration primarily of her right foot.  However, her extremities are warm to the touch and she denies any slow healing wounds.  She describes some sharp pain in her leg this in the middle of the night that will quickly resolve with Tylenol  but no pain that sounds like claudication.  No other cardiac symptoms.  ROS: No chest pain, shortness of breath, orthopnea, PND, lower  extremity edema, palpitations, lightheadedness, dizziness, syncope.     EKGs/Labs/Other Studies Reviewed:    The following studies were reviewed:   Monitor 2024/10/18 to 10/26/2024: Summary: The basic rhythm is normal sinus with an average heart rate of 69 bpm. There is no atrial fibrillation or flutter. Rare PVCs with an overall burden of less than 1%. There are a few runs of wide-complex tachycardia which could be VT or SVT with aberrancy. The longest run is 20 seconds with an average ventricular rate of 165 bpm. There is no evidence of high-grade AV block present.  _______________   Echocardiogram 06/07/2024: Impressions: 1. Left ventricular ejection fraction, by estimation, is 50 to 55%. The  left ventricle has low normal function. The left ventricle demonstrates  regional wall motion abnormalities (see scoring diagram/findings for  description). Left ventricular diastolic   parameters were normal. The average left ventricular global longitudinal  strain is -14.3 %. The global longitudinal strain is abnormal.   2. Right ventricular systolic function is normal. The right ventricular  size is normal. There is normal pulmonary artery systolic pressure. The  estimated right  ventricular systolic pressure is 21.0 mmHg.   3. The mitral valve is grossly normal. No evidence of mitral valve  regurgitation. No evidence of mitral stenosis.   4. The aortic valve is tricuspid. Aortic valve regurgitation is mild.  Aortic valve sclerosis is present, with no evidence of aortic valve  stenosis.   5. The inferior vena cava is normal in size with greater than 50%  respiratory variability, suggesting right atrial pressure of 3 mmHg.  _______________   Left Cardiac Catheterization 06/07/2024: Conclusions: Stable appearance of coronary arteries compared with last catheterization in 2018; there is mild-moderate, non-obstructive coronary artery disease involving the mid/distal LAD, mid LCx, and  proximal/mid LAD. Widely patent overlapping LCx/OM1 stents. Normal left ventricular filling pressure (LVEDP 10 mmHg).   Recommendations: Continue secondary prevention of coronary artery disease. Consider further evaluation for there potential causes of shortness of breath and elevated HS-TnI/BNP, including pulmonary embolism.   Diagnostic Dominance: Right  _______________   Left Lower Extremity Venous Ultrasound 08/19/2024: Summary:  RIGHT:  - No evidence of common femoral vein obstruction.    LEFT:  - No evidence of common femoral vein obstruction.  There is no evidence of DVT from the groin to knee. The distal PTVs and  pero veins are patent. The prox and mid calf vessels are not well  visualized due to edema.  Findings consistent with age indeterminate intramuscular thrombus  involving the left gastrocnemius veins. - No cystic structure found in the  popliteal fossa.  _______________   Left IVC/ Iliac Ultrasound 08/19/2024: Summary:  IVC/Iliac: Normal IVC, left CIV, and left EIV. There is no evidence of  DVT.      EKG:  EKG not ordered today.   Recent Labs: 06/13/2024: B Natriuretic Peptide 541.3 06/18/2024: Magnesium  1.8 07/07/2024: ALT 11 09/07/2024: BUN 10; Creatinine, Ser 0.63; Hemoglobin 10.3; Platelets 213; Potassium 4.5; Sodium 144  Recent Lipid Panel    Component Value Date/Time   CHOL 161 06/07/2024 0513   CHOL 152 03/28/2022 1007   CHOL 153 07/29/2014 0926   TRIG 136 06/07/2024 0513   TRIG 112 07/29/2014 0926   HDL 47 06/07/2024 0513   HDL 59 03/28/2022 1007   HDL 49 07/29/2014 0926   CHOLHDL 3.4 06/07/2024 0513   VLDL 27 06/07/2024 0513   LDLCALC 87 06/07/2024 0513   LDLCALC 74 03/28/2022 1007   LDLCALC 90 05/24/2021 0000   LDLCALC 82 07/29/2014 0926    Physical Exam:    Vital Signs: BP 110/60   Pulse 77   Ht 5' (1.524 m)   Wt 102 lb (46.3 kg)   LMP 11/25/1994   SpO2 98%   BMI 19.92 kg/m     Wt Readings from Last 3 Encounters:  11/29/24  102 lb (46.3 kg)  09/07/24 104 lb (47.2 kg)  08/19/24 103 lb (46.7 kg)     General: 83 y.o. thin Caucasian female in no acute distress. HEENT: Normocephalic and atraumatic. Sclera clear.  Neck: Supple. No carotid bruits. No JVD. Heart: RRR. Distinct S1 and S2. No murmurs, gallops, or rubs.  Lungs: No increased work of breathing. Clear to ausculation bilaterally. No wheezes, rhonchi, or rales.  Extremities: No lower extremity edema. Hyperpigmentation of bilateral ankles and feet (right > left). Posterior tibial pulses 2+ and equal bilaterally. Difficulty palpating distal pedal pulses.  Skin: Warm and dry. Neuro: No focal deficits. Psych: Normal affect. Responds appropriately.   Assessment:    1. Coronary artery disease involving native coronary artery of native heart without  angina pectoris   2. Ischemic cardiomyopathy   3. Frequent PVCs   4. Mild aortic insufficiency   5. History of syncope   6. Primary hypertension   7. Hyperlipidemia, unspecified hyperlipidemia type   8. History of DVT (deep vein thrombosis)   9. History of pulmonary embolus (PE)   10. Discoloration of skin of lower leg     Plan:    CAD  S/p DES to OM1 in 05/2013 in setting of NSTEMI and DES to LCX in 05/2017. She was  admitted in 05/2024 with NSTEMI. LHC showed patent stents and otherwise only mild disease. Unclear cause of troponin elevation.  - No chest pain.  - Continue Toprol -XL 12.5mg  daily.  - No Aspirin  given patient is on Eliquis  for DVT and history of GI bleeding.  - Continue statin.   Ischemic Cardiomyopathy LVEF 35-40 on LV gram at time of NSTEMI in 05/2013 but 50-55% on Echo that same admission. Most recent Echo in 05/2024 showed LVEF of 50-55% with hypokinesis of the mid and distal lateral wall and mid anterolateral wall, normal RV function, and mild AI.  - Euvolemic on exam.  - Continue Lasix  20mg  daily (PCP started this for lower extremity edema). - She was previously on Toprol -XL 12.5mg  daily  but stopped taking this as she did not like the way it made her feel.  - No need to be aggressive in adding GDMT at this time given EF was normal on last Echo, no signs/ symptoms of CHF, and advanced age/ frailty.   Frequent PVCs She was noted to have frequent PVCs and short runs of tachycardia on telemetry during admission in 05/2024.  - No ectopy noted on exam today.  - Continue Toprol -XL 12.5mg  daily.    Mild Aortic Insufficiency Noted on recent Echo in 05/2024.  - Can continue routine monitoring with serial Echos per guidelines.    History of Syncope History of recurrent syncope as described above. Monitor in 09/2023 after a syncopal episode the month prior showed a few short runs of a wide complex tachycardia (VT vs SVT with aberrancy) with longest run being 20 seconds with average heart rate of 165 bpm as well as rare PVCs. Echo in 05/2024 showed normal LV function with no significant valvular disease. She had 2 more syncopal episodes during recent hospitalizations in 05/2024. One sounds clearly vasovagal where she bradyed down with heart rate of 29 while urinating on bedside commode. The other occurred in setting of hypotension and acute blood loss anemia/ acute GI bleed.  - No recurrent syncope since 05/2024.    Hypertension BP well controlled. - Not currently on any medications.    Hyperlipidemia Lipid panel in 10/2024: Total Cholesterol 197, Triglycerides 121, HDL 56, LDL 120. Lipoprotein (a) <8.4. LDL goal <55.  - LDL is not at goal. Discussed increasing Pravastatin  vs switching to a high-intensity statin. After shared-decision making, will increase Pravastatin  to 80mg  daily. - Will repeat lipid panel and LFTs in 3 months.  Recent DVT/ PE Patient was admitted in 06/2024 with extensive DVT of left lower extremity from iliac veins to the left calf veins. Chest CTA also showed a small right lower lobe subsegmental PE. She underwent thrombectomy of DVT by Vascular Surgery. Repeat dopplers  on 08/19/2024 showed no evidence of DVT but did show an age indeterminate intrasmuscular thrombus involving the left gastrocnemius veins.  - Continue Eliquis  5mg  twice daily.  - Will defer duration of Eliquis  to Vascular Surgery/ PCP. Plan is for repeat dopplers  in 01/2024.  Lower Extremity Discoloration Patient reports discoloration of bilateral ankles/ feet (right > left). She denies any pain that sounds like claudication and no poor healing wounds.  - She does have some purple discoloration on exam. Distal pedal pulses were difficult to palpate but she had good posterior tibial pulses. Extremities warm to the touch. No signs of acute ischemic leg.  - Will check ABIs/ TBIs.   Disposition: Follow up in 6 months.    Signed, Shadie Sweatman E Dhara Schepp, PA-C  11/29/2024 3:25 PM    [facility] "

## 2024-11-29 ENCOUNTER — Ambulatory Visit: Attending: Student | Admitting: Student

## 2024-11-29 ENCOUNTER — Encounter: Payer: Self-pay | Admitting: Student

## 2024-11-29 VITALS — BP 110/60 | HR 77 | Ht 60.0 in | Wt 102.0 lb

## 2024-11-29 DIAGNOSIS — I493 Ventricular premature depolarization: Secondary | ICD-10-CM

## 2024-11-29 DIAGNOSIS — Z87898 Personal history of other specified conditions: Secondary | ICD-10-CM

## 2024-11-29 DIAGNOSIS — Z86711 Personal history of pulmonary embolism: Secondary | ICD-10-CM

## 2024-11-29 DIAGNOSIS — Z86718 Personal history of other venous thrombosis and embolism: Secondary | ICD-10-CM | POA: Diagnosis not present

## 2024-11-29 DIAGNOSIS — I351 Nonrheumatic aortic (valve) insufficiency: Secondary | ICD-10-CM | POA: Diagnosis not present

## 2024-11-29 DIAGNOSIS — I10 Essential (primary) hypertension: Secondary | ICD-10-CM | POA: Diagnosis not present

## 2024-11-29 DIAGNOSIS — L819 Disorder of pigmentation, unspecified: Secondary | ICD-10-CM | POA: Diagnosis not present

## 2024-11-29 DIAGNOSIS — I255 Ischemic cardiomyopathy: Secondary | ICD-10-CM | POA: Diagnosis not present

## 2024-11-29 DIAGNOSIS — E785 Hyperlipidemia, unspecified: Secondary | ICD-10-CM

## 2024-11-29 DIAGNOSIS — I2510 Atherosclerotic heart disease of native coronary artery without angina pectoris: Secondary | ICD-10-CM

## 2024-11-29 MED ORDER — PRAVASTATIN SODIUM 80 MG PO TABS: 80.0000 mg | ORAL_TABLET | Freq: Every evening | ORAL | 2 refills | Status: AC

## 2024-11-29 NOTE — Patient Instructions (Addendum)
 Medication Instructions:  -INCREASE Pravastatin  to 80mg  once daily. We have sent in a new prescription of the 80mg  tablets, but you can take two of the 40mg  tablets that you already have at home until you run out of this. *If you need a refill on your cardiac medications before your next appointment, please call your pharmacy*  Lab Work: Repeat lipid panel and LFTs in 3 months. Your will need to be fasting for these labs. If you have labs (blood work) drawn today and your tests are completely normal, you will receive your results only by: MyChart Message (if you have MyChart) OR A paper copy in the mail If you have any lab test that is abnormal or we need to change your treatment, we will call you to review the results.  Testing/Procedures: ABIs/ TBIs.   Follow-Up: At [facility], you and your health needs are our priority.  As part of our continuing mission to provide you with exceptional heart care, our providers are all part of one team.  This team includes your primary Cardiologist (physician) and Advanced Practice Providers or APPs (Physician Assistants and Nurse Practitioners) who all work together to provide you with the care you need, when you need it.  Your next appointment:   6 month(s)  Provider:   Dr. Wonda or Callie Goodrich, PA-C  We recommend signing up for the patient portal called MyChart.  Sign up information is provided on this After Visit Summary.  MyChart is used to connect with patients for Virtual Visits (Telemedicine).  Patients are able to view lab/test results, encounter notes, upcoming appointments, etc.  Non-urgent messages can be sent to your provider as well.   To learn more about what you can do with MyChart, go to [email or web address].

## 2024-12-20 ENCOUNTER — Other Ambulatory Visit: Payer: Self-pay | Admitting: Cardiovascular Disease

## 2025-01-27 ENCOUNTER — Ambulatory Visit

## 2025-01-27 ENCOUNTER — Ambulatory Visit (HOSPITAL_COMMUNITY)

## 2025-01-27 ENCOUNTER — Encounter (HOSPITAL_COMMUNITY)
